# Patient Record
Sex: Male | Born: 1970 | Race: White | Hispanic: No | Marital: Single | Smoking: Former smoker
Health system: Southern US, Community
[De-identification: ages and names within clinical notes are randomized; demographics above are authoritative.]

## PROBLEM LIST (undated history)

## (undated) DIAGNOSIS — S069XAA Unspecified intracranial injury with loss of consciousness status unknown, initial encounter: Secondary | ICD-10-CM

## (undated) DIAGNOSIS — Z87442 Personal history of urinary calculi: Secondary | ICD-10-CM

## (undated) DIAGNOSIS — F1011 Alcohol abuse, in remission: Secondary | ICD-10-CM

## (undated) DIAGNOSIS — K219 Gastro-esophageal reflux disease without esophagitis: Secondary | ICD-10-CM

## (undated) DIAGNOSIS — R251 Tremor, unspecified: Secondary | ICD-10-CM

## (undated) DIAGNOSIS — R569 Unspecified convulsions: Secondary | ICD-10-CM

## (undated) DIAGNOSIS — K7460 Unspecified cirrhosis of liver: Secondary | ICD-10-CM

## (undated) DIAGNOSIS — F32A Depression, unspecified: Secondary | ICD-10-CM

## (undated) DIAGNOSIS — F329 Major depressive disorder, single episode, unspecified: Secondary | ICD-10-CM

## (undated) DIAGNOSIS — S069X9A Unspecified intracranial injury with loss of consciousness of unspecified duration, initial encounter: Secondary | ICD-10-CM

## (undated) DIAGNOSIS — I82409 Acute embolism and thrombosis of unspecified deep veins of unspecified lower extremity: Secondary | ICD-10-CM

## (undated) HISTORY — DX: Gastro-esophageal reflux disease without esophagitis: K21.9

## (undated) HISTORY — PX: GASTROSTOMY TUBE PLACEMENT: SHX655

## (undated) HISTORY — PX: TRACHEOSTOMY: SUR1362

## (undated) HISTORY — PX: BRAIN SURGERY: SHX531

## (undated) HISTORY — DX: Unspecified cirrhosis of liver: K74.60

## (undated) HISTORY — DX: Depression, unspecified: F32.A

## (undated) HISTORY — PX: REMOVAL OF GASTROSTOMY TUBE: SHX6058

## (undated) HISTORY — DX: Major depressive disorder, single episode, unspecified: F32.9

---

## 1999-03-12 ENCOUNTER — Ambulatory Visit (HOSPITAL_BASED_OUTPATIENT_CLINIC_OR_DEPARTMENT_OTHER): Admission: RE | Admit: 1999-03-12 | Discharge: 1999-03-12 | Payer: Self-pay | Admitting: General Surgery

## 2013-10-16 ENCOUNTER — Ambulatory Visit: Payer: Medicaid Other | Attending: Physical Therapy | Admitting: Physical Therapy

## 2013-10-16 DIAGNOSIS — R279 Unspecified lack of coordination: Secondary | ICD-10-CM | POA: Insufficient documentation

## 2013-10-16 DIAGNOSIS — Z5189 Encounter for other specified aftercare: Secondary | ICD-10-CM | POA: Insufficient documentation

## 2013-10-16 DIAGNOSIS — R569 Unspecified convulsions: Secondary | ICD-10-CM | POA: Insufficient documentation

## 2013-10-16 DIAGNOSIS — M6281 Muscle weakness (generalized): Secondary | ICD-10-CM | POA: Insufficient documentation

## 2013-10-16 DIAGNOSIS — Z87820 Personal history of traumatic brain injury: Secondary | ICD-10-CM | POA: Insufficient documentation

## 2013-10-22 ENCOUNTER — Ambulatory Visit: Payer: Medicaid Other | Admitting: Occupational Therapy

## 2013-10-22 ENCOUNTER — Ambulatory Visit: Payer: Medicaid Other

## 2013-10-22 DIAGNOSIS — M6281 Muscle weakness (generalized): Secondary | ICD-10-CM | POA: Diagnosis not present

## 2013-10-22 DIAGNOSIS — R279 Unspecified lack of coordination: Secondary | ICD-10-CM | POA: Diagnosis not present

## 2013-10-22 DIAGNOSIS — Z87820 Personal history of traumatic brain injury: Secondary | ICD-10-CM | POA: Diagnosis not present

## 2013-10-22 DIAGNOSIS — Z5189 Encounter for other specified aftercare: Secondary | ICD-10-CM | POA: Diagnosis present

## 2013-10-22 DIAGNOSIS — R569 Unspecified convulsions: Secondary | ICD-10-CM | POA: Diagnosis not present

## 2013-10-25 ENCOUNTER — Ambulatory Visit: Payer: Medicaid Other | Attending: Internal Medicine | Admitting: Speech Pathology

## 2013-10-25 DIAGNOSIS — R4701 Aphasia: Secondary | ICD-10-CM | POA: Diagnosis not present

## 2013-10-25 DIAGNOSIS — R279 Unspecified lack of coordination: Secondary | ICD-10-CM | POA: Diagnosis not present

## 2013-10-25 DIAGNOSIS — M6281 Muscle weakness (generalized): Secondary | ICD-10-CM | POA: Diagnosis not present

## 2013-10-25 DIAGNOSIS — Z87820 Personal history of traumatic brain injury: Secondary | ICD-10-CM | POA: Diagnosis not present

## 2013-10-25 DIAGNOSIS — Z5189 Encounter for other specified aftercare: Secondary | ICD-10-CM | POA: Insufficient documentation

## 2013-10-25 DIAGNOSIS — R1313 Dysphagia, pharyngeal phase: Secondary | ICD-10-CM | POA: Diagnosis not present

## 2013-10-25 DIAGNOSIS — R262 Difficulty in walking, not elsewhere classified: Secondary | ICD-10-CM | POA: Insufficient documentation

## 2013-11-15 ENCOUNTER — Ambulatory Visit: Payer: Medicaid Other | Admitting: Occupational Therapy

## 2013-11-15 ENCOUNTER — Ambulatory Visit: Payer: Medicaid Other | Admitting: Speech Pathology

## 2013-11-15 DIAGNOSIS — Z5189 Encounter for other specified aftercare: Secondary | ICD-10-CM | POA: Diagnosis not present

## 2013-11-21 ENCOUNTER — Ambulatory Visit: Payer: Medicaid Other

## 2013-11-21 ENCOUNTER — Ambulatory Visit: Payer: Medicaid Other | Admitting: Occupational Therapy

## 2013-11-21 DIAGNOSIS — Z5189 Encounter for other specified aftercare: Secondary | ICD-10-CM | POA: Diagnosis not present

## 2013-11-23 ENCOUNTER — Encounter: Payer: Medicaid Other | Admitting: Occupational Therapy

## 2013-11-23 ENCOUNTER — Ambulatory Visit: Payer: Medicaid Other

## 2013-11-27 ENCOUNTER — Ambulatory Visit: Payer: Medicaid Other | Attending: Internal Medicine | Admitting: Physical Therapy

## 2013-11-27 ENCOUNTER — Ambulatory Visit: Payer: Medicaid Other | Admitting: Speech Pathology

## 2013-11-27 ENCOUNTER — Ambulatory Visit: Payer: Medicaid Other | Admitting: Occupational Therapy

## 2013-11-27 DIAGNOSIS — M6281 Muscle weakness (generalized): Secondary | ICD-10-CM | POA: Diagnosis not present

## 2013-11-27 DIAGNOSIS — Z5189 Encounter for other specified aftercare: Secondary | ICD-10-CM | POA: Insufficient documentation

## 2013-11-27 DIAGNOSIS — R1313 Dysphagia, pharyngeal phase: Secondary | ICD-10-CM | POA: Diagnosis not present

## 2013-11-27 DIAGNOSIS — R4701 Aphasia: Secondary | ICD-10-CM | POA: Insufficient documentation

## 2013-11-27 DIAGNOSIS — Z87820 Personal history of traumatic brain injury: Secondary | ICD-10-CM | POA: Diagnosis not present

## 2013-11-27 DIAGNOSIS — R262 Difficulty in walking, not elsewhere classified: Secondary | ICD-10-CM | POA: Insufficient documentation

## 2013-11-27 DIAGNOSIS — R279 Unspecified lack of coordination: Secondary | ICD-10-CM | POA: Insufficient documentation

## 2013-11-29 ENCOUNTER — Ambulatory Visit: Payer: Medicaid Other

## 2013-11-29 ENCOUNTER — Encounter: Payer: Medicaid Other | Admitting: Occupational Therapy

## 2013-11-29 ENCOUNTER — Ambulatory Visit: Payer: Medicaid Other | Admitting: Speech Pathology

## 2013-11-29 ENCOUNTER — Encounter: Payer: Medicaid Other | Admitting: Speech Pathology

## 2013-11-29 DIAGNOSIS — Z5189 Encounter for other specified aftercare: Secondary | ICD-10-CM | POA: Diagnosis not present

## 2013-12-04 ENCOUNTER — Ambulatory Visit: Payer: Medicaid Other | Admitting: Occupational Therapy

## 2013-12-04 ENCOUNTER — Ambulatory Visit: Payer: Medicaid Other | Admitting: Physical Therapy

## 2013-12-04 ENCOUNTER — Ambulatory Visit: Payer: Medicaid Other | Admitting: Speech Pathology

## 2013-12-04 DIAGNOSIS — Z5189 Encounter for other specified aftercare: Secondary | ICD-10-CM | POA: Diagnosis not present

## 2013-12-06 ENCOUNTER — Ambulatory Visit: Payer: Medicaid Other | Admitting: Speech Pathology

## 2013-12-06 ENCOUNTER — Ambulatory Visit: Payer: Medicaid Other

## 2013-12-06 DIAGNOSIS — Z5189 Encounter for other specified aftercare: Secondary | ICD-10-CM | POA: Diagnosis not present

## 2013-12-10 ENCOUNTER — Emergency Department (HOSPITAL_COMMUNITY): Payer: Medicaid Other

## 2013-12-10 ENCOUNTER — Emergency Department (HOSPITAL_COMMUNITY)
Admission: EM | Admit: 2013-12-10 | Discharge: 2013-12-10 | Disposition: A | Discharge status: Home / Self Care | Payer: Medicaid Other | Attending: Emergency Medicine | Admitting: Emergency Medicine

## 2013-12-10 ENCOUNTER — Encounter (HOSPITAL_COMMUNITY): Payer: Self-pay | Admitting: Emergency Medicine

## 2013-12-10 DIAGNOSIS — Z86718 Personal history of other venous thrombosis and embolism: Secondary | ICD-10-CM | POA: Insufficient documentation

## 2013-12-10 DIAGNOSIS — R1084 Generalized abdominal pain: Secondary | ICD-10-CM | POA: Insufficient documentation

## 2013-12-10 DIAGNOSIS — R0602 Shortness of breath: Secondary | ICD-10-CM | POA: Insufficient documentation

## 2013-12-10 DIAGNOSIS — Z7901 Long term (current) use of anticoagulants: Secondary | ICD-10-CM | POA: Insufficient documentation

## 2013-12-10 DIAGNOSIS — Z8672 Personal history of thrombophlebitis: Secondary | ICD-10-CM | POA: Insufficient documentation

## 2013-12-10 DIAGNOSIS — G40909 Epilepsy, unspecified, not intractable, without status epilepticus: Secondary | ICD-10-CM | POA: Insufficient documentation

## 2013-12-10 DIAGNOSIS — Z79899 Other long term (current) drug therapy: Secondary | ICD-10-CM | POA: Insufficient documentation

## 2013-12-10 DIAGNOSIS — R109 Unspecified abdominal pain: Secondary | ICD-10-CM | POA: Insufficient documentation

## 2013-12-10 DIAGNOSIS — Z87442 Personal history of urinary calculi: Secondary | ICD-10-CM | POA: Insufficient documentation

## 2013-12-10 LAB — CBC WITH DIFFERENTIAL/PLATELET
BASOS ABS: 0 10*3/uL (ref 0.0–0.1)
Basophils Relative: 1 % (ref 0–1)
Eosinophils Absolute: 0.3 10*3/uL (ref 0.0–0.7)
Eosinophils Relative: 3 % (ref 0–5)
HCT: 34.2 % — ABNORMAL LOW (ref 39.0–52.0)
HEMOGLOBIN: 11.2 g/dL — AB (ref 13.0–17.0)
Lymphocytes Relative: 20 % (ref 12–46)
Lymphs Abs: 1.8 10*3/uL (ref 0.7–4.0)
MCH: 27.9 pg (ref 26.0–34.0)
MCHC: 32.7 g/dL (ref 30.0–36.0)
MCV: 85.1 fL (ref 78.0–100.0)
Monocytes Absolute: 1 10*3/uL (ref 0.1–1.0)
Monocytes Relative: 12 % (ref 3–12)
NEUTROS ABS: 5.8 10*3/uL (ref 1.7–7.7)
NEUTROS PCT: 64 % (ref 43–77)
Platelets: 152 10*3/uL (ref 150–400)
RBC: 4.02 MIL/uL — ABNORMAL LOW (ref 4.22–5.81)
RDW: 15.5 % (ref 11.5–15.5)
WBC: 8.9 10*3/uL (ref 4.0–10.5)

## 2013-12-10 LAB — URINALYSIS, ROUTINE W REFLEX MICROSCOPIC
Bilirubin Urine: NEGATIVE
GLUCOSE, UA: NEGATIVE mg/dL
KETONES UR: NEGATIVE mg/dL
NITRITE: NEGATIVE
PROTEIN: NEGATIVE mg/dL
Specific Gravity, Urine: 1.019 (ref 1.005–1.030)
Urobilinogen, UA: 1 mg/dL (ref 0.0–1.0)
pH: 7 (ref 5.0–8.0)

## 2013-12-10 LAB — COMPREHENSIVE METABOLIC PANEL
ALBUMIN: 3.8 g/dL (ref 3.5–5.2)
ALK PHOS: 222 U/L — AB (ref 39–117)
ALT: 33 U/L (ref 0–53)
AST: 40 U/L — AB (ref 0–37)
Anion gap: 14 (ref 5–15)
BILIRUBIN TOTAL: 0.5 mg/dL (ref 0.3–1.2)
BUN: 19 mg/dL (ref 6–23)
CHLORIDE: 102 meq/L (ref 96–112)
CO2: 23 mEq/L (ref 19–32)
Calcium: 10.2 mg/dL (ref 8.4–10.5)
Creatinine, Ser: 0.97 mg/dL (ref 0.50–1.35)
GFR calc Af Amer: >90 mL/min (ref >90)
GFR calc non Af Amer: >90 mL/min (ref >90)
Glucose, Bld: 98 mg/dL (ref 70–99)
POTASSIUM: 4.3 meq/L (ref 3.7–5.3)
Sodium: 139 mEq/L (ref 137–147)
Total Protein: 8.1 g/dL (ref 6.0–8.3)

## 2013-12-10 LAB — URINE MICROSCOPIC-ADD ON

## 2013-12-10 LAB — LIPASE, BLOOD: Lipase: 13 U/L (ref 11–59)

## 2013-12-10 MED ORDER — ONDANSETRON 8 MG PO TBDP
8.0000 mg | ORAL_TABLET | Freq: Once | ORAL | Status: AC
  Administered 2013-12-10: 8 mg via ORAL
  Filled 2013-12-10: qty 1

## 2013-12-10 MED ORDER — AMANTADINE HCL 50 MG/5ML PO SYRP
100.0000 mg | ORAL_SOLUTION | Freq: Two times a day (BID) | ORAL | Status: DC
  Administered 2013-12-10: 100 mg via ORAL
  Filled 2013-12-10: qty 10

## 2013-12-10 MED ORDER — IOHEXOL 350 MG/ML SOLN
100.0000 mL | Freq: Once | INTRAVENOUS | Status: AC | PRN
  Administered 2013-12-10: 100 mL via INTRAVENOUS

## 2013-12-10 MED ORDER — ONDANSETRON HCL 4 MG PO TABS
4.0000 mg | ORAL_TABLET | Freq: Four times a day (QID) | ORAL | Status: DC
Start: 2013-12-10 — End: 2017-02-28

## 2013-12-10 MED ORDER — ONDANSETRON HCL 4 MG/2ML IJ SOLN
4.0000 mg | Freq: Once | INTRAMUSCULAR | Status: AC
  Administered 2013-12-10: 4 mg via INTRAVENOUS
  Filled 2013-12-10: qty 2

## 2013-12-10 MED ORDER — HYDROMORPHONE HCL PF 1 MG/ML IJ SOLN
1.0000 mg | Freq: Once | INTRAMUSCULAR | Status: AC
  Administered 2013-12-10: 1 mg via INTRAVENOUS
  Filled 2013-12-10: qty 1

## 2013-12-10 MED ORDER — RIVAROXABAN 20 MG PO TABS: 20.0000 mg | ORAL_TABLET | Freq: Every day | ORAL | Status: DC

## 2013-12-10 MED ORDER — LEVETIRACETAM 100 MG/ML PO SOLN
1000.0000 mg | Freq: Two times a day (BID) | ORAL | Status: DC
  Administered 2013-12-10: 1000 mg via ORAL
  Filled 2013-12-10: qty 10

## 2013-12-10 MED ORDER — BROMOCRIPTINE MESYLATE 2.5 MG PO TABS
5.0000 mg | ORAL_TABLET | Freq: Two times a day (BID) | ORAL | Status: DC
  Administered 2013-12-10: 5 mg via ORAL
  Filled 2013-12-10: qty 2

## 2013-12-10 MED ORDER — OXYCODONE-ACETAMINOPHEN 5-325 MG PO TABS: 1.0000 | ORAL_TABLET | Freq: Four times a day (QID) | ORAL | Status: DC | PRN

## 2013-12-10 MED ORDER — HYDROCODONE-ACETAMINOPHEN 5-325 MG PO TABS
2.0000 | ORAL_TABLET | Freq: Once | ORAL | Status: AC
  Administered 2013-12-10: 2 via ORAL
  Filled 2013-12-10: qty 2

## 2013-12-10 MED ORDER — MIRTAZAPINE 30 MG PO TABS
15.0000 mg | ORAL_TABLET | Freq: Every day | ORAL | Status: DC
  Administered 2013-12-10: 15 mg via ORAL
  Filled 2013-12-10: qty 1

## 2013-12-10 NOTE — ED Provider Notes (Signed)
CSN: [id number]     Arrival date & time 12/10/13  1845 History   First MD Initiated Contact with Patient 12/10/13 1857     Chief Complaint  Patient presents with  . Flank Pain     (Consider location/radiation/quality/duration/timing/severity/associated sxs/prior Treatment) HPI Comments: Patient is a 43 year old male with history of traumatic brain injury who presents the emergency department today with sudden onset right flank pain. He was at his primary care office earlier today being evaluated for cough and worsening shortness of breath. At that time basic labs are drawn as well as a d-dimer. His d-dimer is elevated at 2. The patient is unable to describe this pain for me. He has history of prior pulmonary embolism. He is anticoagulated on Xarelto. He has history of prior kidney stones. His father gives most of the history as patient is unable to coherently speak to me. The father feels as though this is a kidney stone. No fevers, diarrhea, vomiting.   Patient is a 43 y.o. male presenting with flank pain. The history is provided by the patient. No language interpreter was used.  Flank Pain    History reviewed. No pertinent past medical history. History reviewed. No pertinent past surgical history. No family history on file. History  Substance Use Topics  . Smoking status: Not on file  . Smokeless tobacco: Not on file  . Alcohol Use: Not on file    Review of Systems  Unable to perform ROS: Patient nonverbal      Allergies  Review of patient's allergies indicates no known allergies.  Home Medications   Prior to Admission medications   Medication Sig Start Date End Date Taking? Authorizing Provider  amantadine (SYMMETREL) 50 MG/5ML solution Take 100 mg by mouth 2 (two) times daily.   Yes Historical Provider, MD  bromocriptine (PARLODEL) 2.5 MG tablet Take 5 mg by mouth 2 (two) times daily.   Yes Historical Provider, MD  levETIRAcetam (KEPPRA) 100 MG/ML solution Take 1,000 mg  by mouth 2 (two) times daily.   Yes Historical Provider, MD  mirtazapine (REMERON) 15 MG tablet Take 15 mg by mouth at bedtime.  12/07/13 01/06/14 Yes Historical Provider, MD  polyethylene glycol (MIRALAX / GLYCOLAX) packet Take 17 g by mouth daily as needed for mild constipation.   Yes Historical Provider, MD  rivaroxaban (XARELTO) 20 MG TABS tablet Take 20 mg by mouth daily.   Yes Historical Provider, MD  risperiDONE (RISPERDAL) 1 MG/ML oral solution Take 0.5 mg by mouth at bedtime.  12/10/13 01/09/14  Historical Provider, MD   BP 110/66  Pulse 88  Temp(Src) 97.7 F (36.5 C) (Oral)  Resp 32  SpO2 96% Physical Exam  Nursing note and vitals reviewed. Constitutional: He appears well-developed and well-nourished. He appears distressed.  HENT:  Head: Normocephalic and atraumatic.  Right Ear: External ear normal.  Left Ear: External ear normal.  Nose: Nose normal.  Eyes: Conjunctivae are normal.  Neck: Normal range of motion. No tracheal deviation present.  Cardiovascular: Normal rate, regular rhythm, normal heart sounds, intact distal pulses and normal pulses.   Pulses:      Radial pulses are 2+ on the right side, and 2+ on the left side.       Posterior tibial pulses are 2+ on the right side, and 2+ on the left side.  Pulmonary/Chest: Effort normal and breath sounds normal. No stridor. He has no wheezes. He has no rales.    Abdominal: Soft. He exhibits no distension. There is generalized  tenderness.  Generalized abdominal tenderness with maximal tenderness over right flank  Musculoskeletal: Normal range of motion.  Neurological: He is alert.  Skin: Skin is warm and dry. He is not diaphoretic.  Psychiatric: He has a normal mood and affect. His behavior is normal.    ED Course  Procedures (including critical care time) Labs Review Labs Reviewed  CBC WITH DIFFERENTIAL - Abnormal; Notable for the following:    RBC 4.02 (*)    Hemoglobin 11.2 (*)    HCT 34.2 (*)    All other  components within normal limits  COMPREHENSIVE METABOLIC PANEL - Abnormal; Notable for the following:    AST 40 (*)    Alkaline Phosphatase 222 (*)    All other components within normal limits  URINALYSIS, ROUTINE W REFLEX MICROSCOPIC - Abnormal; Notable for the following:    Color, Urine AMBER (*)    APPearance CLOUDY (*)    Hgb urine dipstick LARGE (*)    Leukocytes, UA TRACE (*)    All other components within normal limits  URINE CULTURE  LIPASE, BLOOD  URINE MICROSCOPIC-ADD ON    Imaging Review Ct Angio Chest Pe W/cm &/or Wo Cm  12/10/2013   CLINICAL DATA:  History of traumatic brain injury with recent agitation. History of blood clots in the long, now with elevated D-dimer. Evaluate for pulmonary embolism.  EXAM: CT ANGIOGRAPHY CHEST WITH CONTRAST  TECHNIQUE: Multidetector CT imaging of the chest was performed using the standard protocol during bolus administration of intravenous contrast. Multiplanar CT image reconstructions and MIPs were obtained to evaluate the vascular anatomy.  CONTRAST:  OMNIPAQUE IOHEXOL 350 MG/ML SOLN  COMPARISON:  None.  FINDINGS: Vascular Findings:  There is adequate opacification of the pulmonary arterial system with the main pulmonary artery measuring 350 Hounsfield units. There are no discrete filling defects within the pulmonary arterial tree to suggest pulmonary embolism. Normal caliber the main pulmonary artery.  Normal heart size. No pericardial effusion. Normal caliber of the thoracic aorta. Conventional configuration of the aortic arch. The branch vessels of the aortic arch widely patent throughout their imaged course. No definite thoracic aortic dissection or periaortic stranding.  Review of the MIP images confirms the above findings.   ----------------------------------------------------------------------------------  Nonvascular Findings:  Mild centrilobular emphysematous change. There is minimal subpleural reticulation within the peripheral aspect  of the right lung apex (image 19, series 4). There is minimal subpleural atelectasis about the bilateral major fissures, right greater than left. There is dependent subpleural atelectasis within the bilateral lower lobes. No discrete focal airspace opacities. No pleural effusion or pneumothorax. The central pulmonary airways are widely patent.  No new discrete pulmonary nodules. No mediastinal, hilar axillary lymphadenopathy.  Limited early arterial phase evaluation of the upper abdomen is normal.  There is an age-indeterminate moderate (approximately 50%) compression deformity of the T8 vertebral body without associated displaced fracture line or paraspinal hematoma. Regional soft tissues appear normal. Normal appearance of the thyroid gland.  IMPRESSION: 1. No acute cardiopulmonary disease. Specifically, no evidence of acute or chronic pulmonary embolism. 2. Mild centrilobular emphysematous change with scattered areas of architectural distortion and scar. No discrete focal airspace opacities to suggest pneumonia. 3. Age-indeterminate moderate (approximately 50%) compression deformity of the T8 vertebral body without associated displaced fracture line or paraspinal hematoma. Correlation for point tenderness at this location is recommended.   Electronically Signed   By: Simonne Come M.D.   On: 12/10/2013 21:49   Ct Abdomen Pelvis W Contrast  12/12/2013  CLINICAL DATA:  Mid abdominal pain.  Agitation.  Poor historian.  EXAM: CT ABDOMEN AND PELVIS WITH CONTRAST  TECHNIQUE: Multidetector CT imaging of the abdomen and pelvis was performed using the standard protocol following bolus administration of intravenous contrast.  CONTRAST:  50mL OMNIPAQUE IOHEXOL 300 MG/ML SOLN, 80mL OMNIPAQUE IOHEXOL 300 MG/ML SOLN  COMPARISON:  Retroperitoneal ultrasound December 10, 2013 trauma CT of the chest December 10, 2013  FINDINGS: LUNG BASES: Ground-glass opacities in the lingula, atelectasis in the lung bases. Visualized heart and  pericardium are unremarkable. Please see CT of chest from December 10, 2013  SOLID ORGANS: The liver, spleen, pancreas and adrenal glands are unremarkable. Nonvisualized gallbladder likely representing cholecystectomy.  GASTROINTESTINAL TRACT: Gastrostomy tube in place, intraluminal retaining fall. The stomach, small and large bowel are normal in course and caliber without inflammatory changes. Moderate amount of retained large bowel stool. The appendix is not discretely identified, however there are no inflammatory changes in the right lower quadrant.  KIDNEYS/ URINARY TRACT: Kidneys are orthotopic, demonstrating symmetric enhancement. Punctate right lower pole, 2 mm left interpolar, 3 mm left lower pole nephrolithiasis. No hydronephrosis or solid renal masses. The unopacified ureters are normal in course and caliber. Delayed imaging through the kidneys demonstrates symmetric prompt contrast excretion within the proximal urinary collecting system. Urinary bladder is well distended, dense with layering contrast from CT chest 1 day prior.  PERITONEUM/RETROPERITONEUM: No intraperitoneal free fluid nor free air. Aortoiliac vessels are normal in course and caliber, mild calcific atherosclerosis. No lymphadenopathy by CT size criteria. Internal reproductive organs are unremarkable.  SOFT TISSUE/OSSEOUS STRUCTURES: Nonsuspicious.  IMPRESSION: Moderate amount of retained large bowel stool. Gastrostomy tube in place. No acute intra-abdominal or pelvic process.  Bilateral nonobstructing nephrolithiasis measuring up to 3 mm.   Electronically Signed   By: Awilda Metro   On: 12/12/2013 00:24   US Renal  12/10/2013   CLINICAL DATA:  Right flank pain.  EXAM: RENAL/URINARY TRACT ULTRASOUND COMPLETE  COMPARISON:  None.  FINDINGS: Right Kidney:  Length: 11.1 cm. Normal renal cortical thickness and echogenicity without focal lesions or hydronephrosis.  Left Kidney:  Length: 10.9 cm. Normal renal cortical thickness and  echogenicity without focal lesions or hydronephrosis.  Bladder:  Normal.  Bilateral ureteral jets are documented.  IMPRESSION: Normal renal ultrasound examination.   Electronically Signed   By: Loralie Champagne M.D.   On: 12/10/2013 22:41      EKG Interpretation None      MDM   Final diagnoses:  Right flank pain    Patient presents to emergency department for evaluation of sudden onset right flank pain. He was at his primary care office earlier today and a d-dimer was drawn. D-dimer was elevated. CT angio chest was negative for pulmonary embolism and pneumonia. Renal ultrasound was negative for hydronephrosis. He does have a large amount of hemoglobin in the urine. It is possible that this is a small, nonobstructing kidney stone. Patient appears to be much more comfortable at this point. Workup is otherwise unremarkable. Patient will followup with primary care physician. Vital signs stable for discharge. Discussed reasons to return to emergency Department immediately. Dr. Gwendolyn Grant evaluated patient and agrees with plan. Patient / Family / Caregiver informed of clinical course, understand medical decision-making process, and agree with plan.     Mora Bellman, PA-C 12/14/13 [phone]

## 2013-12-10 NOTE — ED Notes (Addendum)
Per EMS, pt with hx of TBI began acting agitated 1 hour ago, clutching his right flank. Pt given fentanyl en route to hospital. Pt's father has a sheet of paper reporting the pt's d-dimer to be 234, states the pt has a hx of blood clots in his lung. Father also states he suspects the pt has a kidney stone.

## 2013-12-10 NOTE — ED Notes (Signed)
Pt attempted to give urine sample. Will try again later.

## 2013-12-10 NOTE — ED Notes (Signed)
[facility] Expected date:  Expected time:  Means of arrival:  Comments: EMS/flank pain

## 2013-12-10 NOTE — Discharge Instructions (Signed)
Flank Pain °Flank pain refers to pain that is located on the side of the body between the upper abdomen and the back. The pain may occur over a short period of time (acute) or may be long-term or reoccurring (chronic). It may be mild or severe. Flank pain can be caused by many things. °CAUSES  °Some of the more common causes of flank pain include: °· Muscle strains.   °· Muscle spasms.   °· A disease of your spine (vertebral disk disease).   °· A lung infection (pneumonia).   °· Fluid around your lungs (pulmonary edema).   °· A kidney infection.   °· Kidney stones.   °· A very painful skin rash caused by the chickenpox virus (shingles).   °· Gallbladder disease.   °HOME CARE INSTRUCTIONS  °Home care will depend on the cause of your pain. In general, °· Rest as directed by your caregiver. °· Drink enough fluids to keep your urine clear or pale yellow. °· Only take over-the-counter or prescription medicines as directed by your caregiver. Some medicines may help relieve the pain. °· Tell your caregiver about any changes in your pain. °· Follow up with your caregiver as directed. °SEEK IMMEDIATE MEDICAL CARE IF:  °· Your pain is not controlled with medicine.   °· You have new or worsening symptoms. °· Your pain increases.   °· You have abdominal pain.   °· You have shortness of breath.   °· You have persistent nausea or vomiting.   °· You have swelling in your abdomen.   °· You feel faint or pass out.   °· You have blood in your urine. °· You have a fever or persistent symptoms for more than 2-3 days. °· You have a fever and your symptoms suddenly get worse. °MAKE SURE YOU:  °· Understand these instructions. °· Will watch your condition. °· Will get help right away if you are not doing well or get worse. °Document Released: 06/03/2005 Document Revised: 01/05/2012 Document Reviewed: 11/25/2011 °[employer]® Patient Information ©2015 [employer]. This information is not intended to replace advice given to you by your  health care provider. Make sure you discuss any questions you have with your health care provider. ° °

## 2013-12-11 ENCOUNTER — Encounter (HOSPITAL_COMMUNITY): Payer: Self-pay | Admitting: Emergency Medicine

## 2013-12-11 ENCOUNTER — Emergency Department (HOSPITAL_COMMUNITY): Payer: Medicaid Other

## 2013-12-11 ENCOUNTER — Inpatient Hospital Stay (HOSPITAL_COMMUNITY)
Admission: EM | Admit: 2013-12-11 | Discharge: 2014-01-03 | DRG: 004 | Disposition: A | Discharge status: Skilled Nursing Facility | Payer: Medicaid Other | Attending: Internal Medicine | Admitting: Internal Medicine

## 2013-12-11 ENCOUNTER — Encounter: Payer: Medicaid Other | Admitting: Speech Pathology

## 2013-12-11 DIAGNOSIS — R000 Tachycardia, unspecified: Secondary | ICD-10-CM | POA: Diagnosis present

## 2013-12-11 DIAGNOSIS — E43 Unspecified severe protein-calorie malnutrition: Secondary | ICD-10-CM | POA: Diagnosis present

## 2013-12-11 DIAGNOSIS — Z046 Encounter for general psychiatric examination, requested by authority: Secondary | ICD-10-CM

## 2013-12-11 DIAGNOSIS — D638 Anemia in other chronic diseases classified elsewhere: Secondary | ICD-10-CM | POA: Diagnosis present

## 2013-12-11 DIAGNOSIS — J690 Pneumonitis due to inhalation of food and vomit: Secondary | ICD-10-CM

## 2013-12-11 DIAGNOSIS — R1310 Dysphagia, unspecified: Secondary | ICD-10-CM | POA: Diagnosis not present

## 2013-12-11 DIAGNOSIS — F17200 Nicotine dependence, unspecified, uncomplicated: Secondary | ICD-10-CM | POA: Diagnosis present

## 2013-12-11 DIAGNOSIS — Z681 Body mass index (BMI) 19 or less, adult: Secondary | ICD-10-CM

## 2013-12-11 DIAGNOSIS — R400 Somnolence: Secondary | ICD-10-CM

## 2013-12-11 DIAGNOSIS — E876 Hypokalemia: Secondary | ICD-10-CM | POA: Diagnosis not present

## 2013-12-11 DIAGNOSIS — Z79899 Other long term (current) drug therapy: Secondary | ICD-10-CM

## 2013-12-11 DIAGNOSIS — R0682 Tachypnea, not elsewhere classified: Secondary | ICD-10-CM | POA: Diagnosis not present

## 2013-12-11 DIAGNOSIS — E871 Hypo-osmolality and hyponatremia: Secondary | ICD-10-CM | POA: Diagnosis not present

## 2013-12-11 DIAGNOSIS — G9349 Other encephalopathy: Secondary | ICD-10-CM | POA: Diagnosis present

## 2013-12-11 DIAGNOSIS — Z72820 Sleep deprivation: Secondary | ICD-10-CM

## 2013-12-11 DIAGNOSIS — R29898 Other symptoms and signs involving the musculoskeletal system: Secondary | ICD-10-CM | POA: Diagnosis present

## 2013-12-11 DIAGNOSIS — N200 Calculus of kidney: Secondary | ICD-10-CM | POA: Diagnosis present

## 2013-12-11 DIAGNOSIS — R4701 Aphasia: Secondary | ICD-10-CM | POA: Diagnosis present

## 2013-12-11 DIAGNOSIS — Z8249 Family history of ischemic heart disease and other diseases of the circulatory system: Secondary | ICD-10-CM

## 2013-12-11 DIAGNOSIS — R451 Restlessness and agitation: Secondary | ICD-10-CM

## 2013-12-11 DIAGNOSIS — E870 Hyperosmolality and hypernatremia: Secondary | ICD-10-CM | POA: Diagnosis not present

## 2013-12-11 DIAGNOSIS — S069XAA Unspecified intracranial injury with loss of consciousness status unknown, initial encounter: Secondary | ICD-10-CM | POA: Diagnosis present

## 2013-12-11 DIAGNOSIS — R6510 Systemic inflammatory response syndrome (SIRS) of non-infectious origin without acute organ dysfunction: Secondary | ICD-10-CM | POA: Diagnosis present

## 2013-12-11 DIAGNOSIS — S069X9A Unspecified intracranial injury with loss of consciousness of unspecified duration, initial encounter: Secondary | ICD-10-CM | POA: Diagnosis present

## 2013-12-11 DIAGNOSIS — J189 Pneumonia, unspecified organism: Secondary | ICD-10-CM | POA: Diagnosis present

## 2013-12-11 DIAGNOSIS — F05 Delirium due to known physiological condition: Secondary | ICD-10-CM | POA: Diagnosis present

## 2013-12-11 DIAGNOSIS — Z87820 Personal history of traumatic brain injury: Secondary | ICD-10-CM

## 2013-12-11 DIAGNOSIS — S069X5S Unspecified intracranial injury with loss of consciousness greater than 24 hours with return to pre-existing conscious level, sequela: Secondary | ICD-10-CM

## 2013-12-11 DIAGNOSIS — G40909 Epilepsy, unspecified, not intractable, without status epilepticus: Secondary | ICD-10-CM | POA: Diagnosis present

## 2013-12-11 DIAGNOSIS — I82729 Chronic embolism and thrombosis of deep veins of unspecified upper extremity: Secondary | ICD-10-CM | POA: Diagnosis present

## 2013-12-11 DIAGNOSIS — Z87442 Personal history of urinary calculi: Secondary | ICD-10-CM

## 2013-12-11 DIAGNOSIS — J9819 Other pulmonary collapse: Secondary | ICD-10-CM | POA: Diagnosis present

## 2013-12-11 DIAGNOSIS — J438 Other emphysema: Secondary | ICD-10-CM | POA: Diagnosis present

## 2013-12-11 DIAGNOSIS — A419 Sepsis, unspecified organism: Secondary | ICD-10-CM

## 2013-12-11 DIAGNOSIS — R059 Cough, unspecified: Secondary | ICD-10-CM

## 2013-12-11 DIAGNOSIS — M6282 Rhabdomyolysis: Secondary | ICD-10-CM | POA: Diagnosis present

## 2013-12-11 DIAGNOSIS — G9340 Encephalopathy, unspecified: Secondary | ICD-10-CM

## 2013-12-11 DIAGNOSIS — Z7901 Long term (current) use of anticoagulants: Secondary | ICD-10-CM

## 2013-12-11 DIAGNOSIS — Z9181 History of falling: Secondary | ICD-10-CM

## 2013-12-11 DIAGNOSIS — J9600 Acute respiratory failure, unspecified whether with hypoxia or hypercapnia: Secondary | ICD-10-CM | POA: Diagnosis present

## 2013-12-11 DIAGNOSIS — F603 Borderline personality disorder: Secondary | ICD-10-CM | POA: Diagnosis present

## 2013-12-11 DIAGNOSIS — R05 Cough: Secondary | ICD-10-CM

## 2013-12-11 DIAGNOSIS — I82509 Chronic embolism and thrombosis of unspecified deep veins of unspecified lower extremity: Secondary | ICD-10-CM | POA: Diagnosis present

## 2013-12-11 DIAGNOSIS — Z993 Dependence on wheelchair: Secondary | ICD-10-CM

## 2013-12-11 DIAGNOSIS — D696 Thrombocytopenia, unspecified: Secondary | ICD-10-CM | POA: Diagnosis not present

## 2013-12-11 DIAGNOSIS — I959 Hypotension, unspecified: Secondary | ICD-10-CM | POA: Diagnosis not present

## 2013-12-11 DIAGNOSIS — J9601 Acute respiratory failure with hypoxia: Secondary | ICD-10-CM

## 2013-12-11 HISTORY — DX: Unspecified intracranial injury with loss of consciousness of unspecified duration, initial encounter: S06.9X9A

## 2013-12-11 HISTORY — DX: Unspecified convulsions: R56.9

## 2013-12-11 HISTORY — DX: Unspecified intracranial injury with loss of consciousness status unknown, initial encounter: S06.9XAA

## 2013-12-11 HISTORY — DX: Acute embolism and thrombosis of unspecified deep veins of unspecified lower extremity: I82.409

## 2013-12-11 HISTORY — DX: Personal history of urinary calculi: Z87.442

## 2013-12-11 LAB — CBC WITH DIFFERENTIAL/PLATELET
BASOS PCT: 1 % (ref 0–1)
Basophils Absolute: 0 10*3/uL (ref 0.0–0.1)
Eosinophils Absolute: 0.3 10*3/uL (ref 0.0–0.7)
Eosinophils Relative: 4 % (ref 0–5)
HCT: 33.5 % — ABNORMAL LOW (ref 39.0–52.0)
HEMOGLOBIN: 10.9 g/dL — AB (ref 13.0–17.0)
Lymphocytes Relative: 20 % (ref 12–46)
Lymphs Abs: 1.5 10*3/uL (ref 0.7–4.0)
MCH: 27.7 pg (ref 26.0–34.0)
MCHC: 32.5 g/dL (ref 30.0–36.0)
MCV: 85.2 fL (ref 78.0–100.0)
Monocytes Absolute: 0.6 10*3/uL (ref 0.1–1.0)
Monocytes Relative: 8 % (ref 3–12)
NEUTROS PCT: 67 % (ref 43–77)
Neutro Abs: 5.1 10*3/uL (ref 1.7–7.7)
Platelets: 144 10*3/uL — ABNORMAL LOW (ref 150–400)
RBC: 3.93 MIL/uL — ABNORMAL LOW (ref 4.22–5.81)
RDW: 15.4 % (ref 11.5–15.5)
WBC: 7.6 10*3/uL (ref 4.0–10.5)

## 2013-12-11 LAB — COMPREHENSIVE METABOLIC PANEL
ALBUMIN: 3.7 g/dL (ref 3.5–5.2)
ALK PHOS: 236 U/L — AB (ref 39–117)
ALT: 35 U/L (ref 0–53)
ANION GAP: 14 (ref 5–15)
AST: 41 U/L — ABNORMAL HIGH (ref 0–37)
BUN: 21 mg/dL (ref 6–23)
CO2: 26 mEq/L (ref 19–32)
CREATININE: 1.11 mg/dL (ref 0.50–1.35)
Calcium: 10.4 mg/dL (ref 8.4–10.5)
Chloride: 100 mEq/L (ref 96–112)
GFR calc Af Amer: >90 mL/min (ref >90)
GFR calc non Af Amer: 79 mL/min — ABNORMAL LOW (ref >90)
Glucose, Bld: 127 mg/dL — ABNORMAL HIGH (ref 70–99)
POTASSIUM: 4.6 meq/L (ref 3.7–5.3)
Sodium: 140 mEq/L (ref 137–147)
Total Bilirubin: 0.4 mg/dL (ref 0.3–1.2)
Total Protein: 8.1 g/dL (ref 6.0–8.3)

## 2013-12-11 LAB — LIPASE, BLOOD: LIPASE: 16 U/L (ref 11–59)

## 2013-12-11 MED ORDER — IOHEXOL 300 MG/ML  SOLN
100.0000 mL | Freq: Once | INTRAMUSCULAR | Status: AC | PRN
  Administered 2013-12-11: 80 mL via INTRAVENOUS

## 2013-12-11 MED ORDER — IOHEXOL 300 MG/ML  SOLN
50.0000 mL | Freq: Once | INTRAMUSCULAR | Status: AC | PRN
  Administered 2013-12-11: 50 mL via ORAL

## 2013-12-11 NOTE — ED Notes (Signed)
Per EMS-parents are unable to care for him at home and states they are in the process of IVC'ing patient
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
## 2013-12-11 NOTE — ED Notes (Signed)
Patient arrives by EMS with complaints of increased agitation-patient has TBI-was just seen yesterday for same complaint-patient had urinary retention yesterday and had to be cathed/patient indicating to EMS-pain in groin area

## 2013-12-11 NOTE — ED Notes (Signed)
Bed: RESA Expected date:  Expected time:  Means of arrival:  Comments: EMS 43 yo male with combative behavior-hx TBI-fifficulty urinating

## 2013-12-12 ENCOUNTER — Encounter (HOSPITAL_COMMUNITY): Payer: Self-pay | Admitting: Registered Nurse

## 2013-12-12 DIAGNOSIS — S069XAA Unspecified intracranial injury with loss of consciousness status unknown, initial encounter: Secondary | ICD-10-CM

## 2013-12-12 DIAGNOSIS — S069X9A Unspecified intracranial injury with loss of consciousness of unspecified duration, initial encounter: Secondary | ICD-10-CM

## 2013-12-12 LAB — URINE CULTURE
CULTURE: NO GROWTH
Colony Count: NO GROWTH

## 2013-12-12 MED ORDER — OXYCODONE-ACETAMINOPHEN 5-325 MG PO TABS: 1.0000 | ORAL_TABLET | Freq: Four times a day (QID) | ORAL | Status: DC | PRN

## 2013-12-12 MED ORDER — LORAZEPAM 2 MG/ML IJ SOLN
1.0000 mg | Freq: Once | INTRAMUSCULAR | Status: AC
  Administered 2013-12-12: 1 mg via INTRAMUSCULAR
  Filled 2013-12-12: qty 1

## 2013-12-12 MED ORDER — RISPERIDONE 1 MG/ML PO SOLN
0.5000 mg | Freq: Every day | ORAL | Status: DC
  Administered 2013-12-12: 0.5 mg via ORAL
  Filled 2013-12-12 (×2): qty 0.5

## 2013-12-12 MED ORDER — MIRTAZAPINE 15 MG PO TABS
15.0000 mg | ORAL_TABLET | Freq: Every day | ORAL | Status: DC
  Administered 2013-12-13: 15 mg via ORAL
  Filled 2013-12-12: qty 1

## 2013-12-12 MED ORDER — BROMOCRIPTINE MESYLATE 2.5 MG PO TABS
5.0000 mg | ORAL_TABLET | Freq: Two times a day (BID) | ORAL | Status: DC
  Administered 2013-12-13: 5 mg via ORAL
  Filled 2013-12-12 (×3): qty 2

## 2013-12-12 MED ORDER — RIVAROXABAN 20 MG PO TABS
20.0000 mg | ORAL_TABLET | Freq: Every day | ORAL | Status: DC
  Administered 2013-12-13: 20 mg via ORAL
  Filled 2013-12-12 (×3): qty 1

## 2013-12-12 MED ORDER — LORAZEPAM 2 MG/ML IJ SOLN
2.0000 mg | Freq: Once | INTRAMUSCULAR | Status: AC
  Administered 2013-12-12: 2 mg via INTRAMUSCULAR
  Filled 2013-12-12: qty 1

## 2013-12-12 MED ORDER — STERILE WATER FOR INJECTION IJ SOLN
INTRAMUSCULAR | Status: AC
  Administered 2013-12-12: 15:00:00
  Filled 2013-12-12: qty 10

## 2013-12-12 MED ORDER — ZIPRASIDONE MESYLATE 20 MG IM SOLR
INTRAMUSCULAR | Status: AC
  Filled 2013-12-12: qty 20

## 2013-12-12 MED ORDER — ZIPRASIDONE MESYLATE 20 MG IM SOLR
20.0000 mg | Freq: Once | INTRAMUSCULAR | Status: AC
  Administered 2013-12-12: 20 mg via INTRAMUSCULAR
  Filled 2013-12-12: qty 20

## 2013-12-12 MED ORDER — STERILE WATER FOR INJECTION IJ SOLN
INTRAMUSCULAR | Status: AC
  Filled 2013-12-12: qty 10

## 2013-12-12 MED ORDER — AMANTADINE HCL 50 MG/5ML PO SYRP
100.0000 mg | ORAL_SOLUTION | Freq: Two times a day (BID) | ORAL | Status: DC
  Administered 2013-12-12: 100 mg via ORAL
  Filled 2013-12-12 (×3): qty 10

## 2013-12-12 MED ORDER — LORAZEPAM 1 MG PO TABS
1.0000 mg | ORAL_TABLET | Freq: Three times a day (TID) | ORAL | Status: DC | PRN
  Administered 2013-12-12: 1 mg via ORAL
  Filled 2013-12-12: qty 1

## 2013-12-12 MED ORDER — LORAZEPAM 1 MG PO TABS
1.0000 mg | ORAL_TABLET | Freq: Once | ORAL | Status: AC
  Administered 2013-12-12: 1 mg via ORAL
  Filled 2013-12-12: qty 1

## 2013-12-12 NOTE — ED Notes (Signed)
Pt  Was given PO fluids and apple sauce to eat

## 2013-12-12 NOTE — Consult Note (Signed)
[facility] Face-to-Face Psychiatry Consult   Reason for Consult:  Aggressive behavior Referring Physician:  EDP  Hector [name] is an 43 y.o. male. Total Time spent with patient: 30 minutes  Assessment: AXIS I:  Tramatic Brain Injury AXIS II:  Deferred AXIS III:   Past Medical History  Diagnosis Date  . TBI (traumatic brain injury)   . Seizure    AXIS IV:  other psychosocial or environmental problems AXIS V:  Unable to determine patient level related to patient being non-communitive and unsure if patient is able to understand  Plan:  Clear psychiatrically  Subjective:   Hector [name] is a 43 y.o. male patient.  HPI:  Patient brought to [facility] related to increased agitation and aggression at home.  Patient is unable to communicate verbally.  When asked questions patient would only grunt.  Interviewers are uncertain if patient understands questions asked.  Interpretation is that patient is not processing all of the information correctly.  Also some of patient behavior my be related to not being able to express himself when he is trying to do something and is prevented because others who can not understand his actions.  It is recommended that patient follow up with a neurologist once patient is medically cleared in the emergency room.  Also refer patient to social work and care management related to getting assistance with placement  If needed by patients parents HPI Elements:   Location:  TBI. Quality:  aggressive. Severity:  agitation. Timing:  76month.  Past Psychiatric History: Past Medical History  Diagnosis Date  . TBI (traumatic brain injury)   . Seizure     has no tobacco, alcohol, and drug history on file. No family history on file.         Allergies:  No Known Allergies  ACT Assessment Complete:  Yes:    Educational Status    Risk to Self:    Risk to Others:    Abuse:    Prior Inpatient Therapy:    Prior Outpatient Therapy:    Additional Information:     No  family history on file. Review of Systems  Unable to perform ROS: medical condition     Objective: Blood pressure 112/68, pulse 104, temperature 97.9 F (36.6 C), temperature source Oral, resp. rate 16, SpO2 98.00%.There is no height or weight on file to calculate BMI. Results for orders placed during the hospital encounter of 12/11/13 (from the past 72 hour(s))  CBC WITH DIFFERENTIAL     Status: Abnormal   Collection Time    12/11/13 10:17 PM      Result Value Ref Range   WBC 7.6  4.0 - 10.5 K/uL   RBC 3.93 (*) 4.22 - 5.81 MIL/uL   Hemoglobin 10.9 (*) 13.0 - 17.0 g/dL   HCT 33.5 (*) 39.0 - 52.0 %   MCV 85.2  78.0 - 100.0 fL   MCH 27.7  26.0 - 34.0 pg   MCHC 32.5  30.0 - 36.0 g/dL   RDW 15.4  11.5 - 15.5 %   Platelets 144 (*) 150 - 400 K/uL   Neutrophils Relative % 67  43 - 77 %   Neutro Abs 5.1  1.7 - 7.7 K/uL   Lymphocytes Relative 20  12 - 46 %   Lymphs Abs 1.5  0.7 - 4.0 K/uL   Monocytes Relative 8  3 - 12 %   Monocytes Absolute 0.6  0.1 - 1.0 K/uL   Eosinophils Relative 4  0 -  5 %   Eosinophils Absolute 0.3  0.0 - 0.7 K/uL   Basophils Relative 1  0 - 1 %   Basophils Absolute 0.0  0.0 - 0.1 K/uL  COMPREHENSIVE METABOLIC PANEL     Status: Abnormal   Collection Time    12/11/13 10:17 PM      Result Value Ref Range   Sodium 140  137 - 147 mEq/L   Potassium 4.6  3.7 - 5.3 mEq/L   Chloride 100  96 - 112 mEq/L   CO2 26  19 - 32 mEq/L   Glucose, Bld 127 (*) 70 - 99 mg/dL   BUN 21  6 - 23 mg/dL   Creatinine, Ser 1.11  0.50 - 1.35 mg/dL   Calcium 10.4  8.4 - 10.5 mg/dL   Total Protein 8.1  6.0 - 8.3 g/dL   Albumin 3.7  3.5 - 5.2 g/dL   AST 41 (*) 0 - 37 U/L   ALT 35  0 - 53 U/L   Alkaline Phosphatase 236 (*) 39 - 117 U/L   Total Bilirubin 0.4  0.3 - 1.2 mg/dL   GFR calc non Af Amer 79 (*) >90 mL/min   GFR calc Af Amer >90  >90 mL/min   Comment: (NOTE)     The eGFR has been calculated using the CKD EPI equation.     This calculation has not been validated in all  clinical situations.     eGFR's persistently <90 mL/min signify possible Chronic Kidney     Disease.   Anion gap 14  5 - 15  LIPASE, BLOOD     Status: None   Collection Time    12/11/13 10:17 PM      Result Value Ref Range   Lipase 16  11 - 59 U/L   Labs are reviewed see abnormal values above.  Medications reviewed and no changes made.  Current Facility-Administered Medications  Medication Dose Route Frequency Provider Last Rate Last Dose  . LORazepam (ATIVAN) tablet 1 mg  1 mg Oral Q8H PRN Abigail Butts, PA-C       Current Outpatient Prescriptions  Medication Sig Dispense Refill  . amantadine (SYMMETREL) 50 MG/5ML solution Take 100 mg by mouth 2 (two) times daily.      . bromocriptine (PARLODEL) 2.5 MG tablet Take 5 mg by mouth 2 (two) times daily.      . mirtazapine (REMERON) 15 MG tablet Take 15 mg by mouth at bedtime.       . ondansetron (ZOFRAN) 4 MG tablet Take 1 tablet (4 mg total) by mouth every 6 (six) hours.  12 tablet  0  . oxyCODONE-acetaminophen (PERCOCET/ROXICET) 5-325 MG per tablet Take 1-2 tablets by mouth every 6 (six) hours as needed for moderate pain or severe pain.  12 tablet  0  . polyethylene glycol (MIRALAX / GLYCOLAX) packet Take 17 g by mouth daily as needed for mild constipation.      . risperiDONE (RISPERDAL) 1 MG/ML oral solution Take 0.5 mg by mouth at bedtime.       . rivaroxaban (XARELTO) 20 MG TABS tablet Take 20 mg by mouth daily.        Psychiatric Specialty Exam: Unable to do PSE related to patient being unable to communicate and unable to determine if patient understood questions asked.  Musculoskeletal: Strength & Muscle Tone: spastic Gait & Station: Did not see patient ambulate. Patient in bed Patient leans: Patient was jerky   Treatment Plan Summary: Patient is cleared  psychiatrically.  Patient of [place] can determine when patient is medically cleared and discharged.  Recommend neurology consult of follow up and social work/case  management for assistance with placement.    Hurshel Bouillon, FNP-BC 12/12/2013 11:10 AM

## 2013-12-12 NOTE — Progress Notes (Signed)
  CARE MANAGEMENT ED NOTE 12/12/2013  Patient:  Hector [name],Hector [name]   Account Number:  [id number]  Date Initiated:  12/12/2013  Documentation initiated by:  Edd ArbourGIBBS,Crawford Tamura  Subjective/Objective Assessment:   43 yr old [place]icaid [place] access pt increased agitation-patient has TBI-was just seen yesterday for same complaint-patient had urinary retention yesterday and had to be cathed/patient indicating to EMS-pain in groin area     Subjective/Objective Assessment Detail:     Action/Plan:   ED CM consulted with ED SW about pt   Action/Plan Detail:   1630 ED Cm left a voice message for [facility] therapy staff to request PT evaluation   Anticipated DC Date:       Status Recommendation to Physician:   Result of Recommendation:    Other ED Services  Consult Working Plan   In-house referral  Clinical Social Worker   DC Associate [employer]lanning Services  Other    Choice offered to / List presented to:            Status of service:  Completed, signed off  ED Comments:   ED Comments Detail:

## 2013-12-12 NOTE — ED Notes (Signed)
Patient is exteremly agitated at this time. Patient HR 145 BP stable. Patient attempting to climb out of bed. MD made aware and discussed further plan for patient.

## 2013-12-12 NOTE — Consult Note (Signed)
Face to face evaluation and I agree with this note 

## 2013-12-12 NOTE — ED Notes (Signed)
Pt removed G tube.

## 2013-12-12 NOTE — ED Notes (Signed)
Pt seems agitated, fidgety, picking at sheets, constantly repositioning himself. Offered ativan PO which pt accepted. Updated pt on plan of care. Awaiting SW consult for long term placement.
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
## 2013-12-12 NOTE — Progress Notes (Addendum)
Per [name], PA  - patient will be seen face to face with the Psychiatrist in the morning for a final disposition.  Writer informed the ER MD Dr. Ranae PalmsYelverton and the nurse ([place]) of the patients disposition. Writer was unable to assess the patient because he had to be redirected by three nurses and then given Geodon.    [name],Hector Father [phone] [name],Hector Mother [phone] Home phone number is [phone].

## 2013-12-13 ENCOUNTER — Inpatient Hospital Stay (HOSPITAL_COMMUNITY): Payer: Medicaid Other

## 2013-12-13 ENCOUNTER — Emergency Department (HOSPITAL_COMMUNITY): Payer: Medicaid Other

## 2013-12-13 ENCOUNTER — Encounter (HOSPITAL_COMMUNITY): Payer: Self-pay | Admitting: Internal Medicine

## 2013-12-13 DIAGNOSIS — N200 Calculus of kidney: Secondary | ICD-10-CM | POA: Diagnosis present

## 2013-12-13 DIAGNOSIS — S069X9S Unspecified intracranial injury with loss of consciousness of unspecified duration, sequela: Secondary | ICD-10-CM

## 2013-12-13 DIAGNOSIS — I959 Hypotension, unspecified: Secondary | ICD-10-CM | POA: Diagnosis not present

## 2013-12-13 DIAGNOSIS — A419 Sepsis, unspecified organism: Secondary | ICD-10-CM | POA: Diagnosis present

## 2013-12-13 DIAGNOSIS — Z9181 History of falling: Secondary | ICD-10-CM | POA: Diagnosis not present

## 2013-12-13 DIAGNOSIS — J189 Pneumonia, unspecified organism: Secondary | ICD-10-CM | POA: Diagnosis present

## 2013-12-13 DIAGNOSIS — Z87820 Personal history of traumatic brain injury: Secondary | ICD-10-CM | POA: Diagnosis not present

## 2013-12-13 DIAGNOSIS — S069XAS Unspecified intracranial injury with loss of consciousness status unknown, sequela: Secondary | ICD-10-CM

## 2013-12-13 DIAGNOSIS — J9600 Acute respiratory failure, unspecified whether with hypoxia or hypercapnia: Secondary | ICD-10-CM | POA: Diagnosis present

## 2013-12-13 DIAGNOSIS — R4701 Aphasia: Secondary | ICD-10-CM | POA: Diagnosis present

## 2013-12-13 DIAGNOSIS — R0682 Tachypnea, not elsewhere classified: Secondary | ICD-10-CM | POA: Diagnosis not present

## 2013-12-13 DIAGNOSIS — F05 Delirium due to known physiological condition: Secondary | ICD-10-CM | POA: Diagnosis present

## 2013-12-13 DIAGNOSIS — R29898 Other symptoms and signs involving the musculoskeletal system: Secondary | ICD-10-CM | POA: Diagnosis present

## 2013-12-13 DIAGNOSIS — G9349 Other encephalopathy: Secondary | ICD-10-CM | POA: Diagnosis present

## 2013-12-13 DIAGNOSIS — I82729 Chronic embolism and thrombosis of deep veins of unspecified upper extremity: Secondary | ICD-10-CM | POA: Diagnosis present

## 2013-12-13 DIAGNOSIS — R6510 Systemic inflammatory response syndrome (SIRS) of non-infectious origin without acute organ dysfunction: Secondary | ICD-10-CM | POA: Diagnosis present

## 2013-12-13 DIAGNOSIS — IMO0002 Reserved for concepts with insufficient information to code with codable children: Secondary | ICD-10-CM | POA: Diagnosis present

## 2013-12-13 DIAGNOSIS — E871 Hypo-osmolality and hyponatremia: Secondary | ICD-10-CM | POA: Diagnosis not present

## 2013-12-13 DIAGNOSIS — G9340 Encephalopathy, unspecified: Secondary | ICD-10-CM

## 2013-12-13 DIAGNOSIS — F17200 Nicotine dependence, unspecified, uncomplicated: Secondary | ICD-10-CM | POA: Diagnosis present

## 2013-12-13 DIAGNOSIS — Z993 Dependence on wheelchair: Secondary | ICD-10-CM | POA: Diagnosis not present

## 2013-12-13 DIAGNOSIS — F603 Borderline personality disorder: Secondary | ICD-10-CM | POA: Diagnosis present

## 2013-12-13 DIAGNOSIS — Z72820 Sleep deprivation: Secondary | ICD-10-CM | POA: Diagnosis not present

## 2013-12-13 DIAGNOSIS — Z7901 Long term (current) use of anticoagulants: Secondary | ICD-10-CM | POA: Diagnosis not present

## 2013-12-13 DIAGNOSIS — D638 Anemia in other chronic diseases classified elsewhere: Secondary | ICD-10-CM | POA: Diagnosis present

## 2013-12-13 DIAGNOSIS — R1310 Dysphagia, unspecified: Secondary | ICD-10-CM | POA: Diagnosis not present

## 2013-12-13 DIAGNOSIS — Z87442 Personal history of urinary calculi: Secondary | ICD-10-CM | POA: Diagnosis not present

## 2013-12-13 DIAGNOSIS — Z8249 Family history of ischemic heart disease and other diseases of the circulatory system: Secondary | ICD-10-CM | POA: Diagnosis not present

## 2013-12-13 DIAGNOSIS — M6282 Rhabdomyolysis: Secondary | ICD-10-CM | POA: Diagnosis present

## 2013-12-13 DIAGNOSIS — E43 Unspecified severe protein-calorie malnutrition: Secondary | ICD-10-CM | POA: Diagnosis present

## 2013-12-13 DIAGNOSIS — R000 Tachycardia, unspecified: Secondary | ICD-10-CM | POA: Diagnosis present

## 2013-12-13 DIAGNOSIS — Z681 Body mass index (BMI) 19 or less, adult: Secondary | ICD-10-CM | POA: Diagnosis not present

## 2013-12-13 DIAGNOSIS — E876 Hypokalemia: Secondary | ICD-10-CM | POA: Diagnosis not present

## 2013-12-13 DIAGNOSIS — E870 Hyperosmolality and hypernatremia: Secondary | ICD-10-CM | POA: Diagnosis not present

## 2013-12-13 DIAGNOSIS — J438 Other emphysema: Secondary | ICD-10-CM | POA: Diagnosis present

## 2013-12-13 DIAGNOSIS — I82509 Chronic embolism and thrombosis of unspecified deep veins of unspecified lower extremity: Secondary | ICD-10-CM | POA: Diagnosis present

## 2013-12-13 DIAGNOSIS — D696 Thrombocytopenia, unspecified: Secondary | ICD-10-CM | POA: Diagnosis not present

## 2013-12-13 DIAGNOSIS — G40909 Epilepsy, unspecified, not intractable, without status epilepticus: Secondary | ICD-10-CM | POA: Diagnosis present

## 2013-12-13 DIAGNOSIS — Z79899 Other long term (current) drug therapy: Secondary | ICD-10-CM | POA: Diagnosis not present

## 2013-12-13 DIAGNOSIS — J9819 Other pulmonary collapse: Secondary | ICD-10-CM | POA: Diagnosis present

## 2013-12-13 LAB — BLOOD GAS, ARTERIAL
ACID-BASE DEFICIT: 3.3 mmol/L — AB (ref 0.0–2.0)
Bicarbonate: 20 mEq/L (ref 20.0–24.0)
DRAWN BY: 331471
O2 SAT: 92.6 %
PATIENT TEMPERATURE: 98.6
TCO2: 18.2 mmol/L (ref 0–100)
pCO2 arterial: 31.5 mmHg — ABNORMAL LOW (ref 35.0–45.0)
pH, Arterial: 7.418 (ref 7.350–7.450)
pO2, Arterial: 70.5 mmHg — ABNORMAL LOW (ref 80.0–100.0)

## 2013-12-13 LAB — CK: Total CK: 1022 U/L — ABNORMAL HIGH (ref 7–232)

## 2013-12-13 LAB — URINALYSIS, ROUTINE W REFLEX MICROSCOPIC
Glucose, UA: NEGATIVE mg/dL
Ketones, ur: 40 mg/dL — AB
Leukocytes, UA: NEGATIVE
Nitrite: NEGATIVE
PROTEIN: NEGATIVE mg/dL
Specific Gravity, Urine: 1.018 (ref 1.005–1.030)
UROBILINOGEN UA: 1 mg/dL (ref 0.0–1.0)
pH: 5 (ref 5.0–8.0)

## 2013-12-13 LAB — COMPREHENSIVE METABOLIC PANEL
ALK PHOS: 243 U/L — AB (ref 39–117)
ALT: 33 U/L (ref 0–53)
AST: 48 U/L — ABNORMAL HIGH (ref 0–37)
Albumin: 3.9 g/dL (ref 3.5–5.2)
Anion gap: 20 — ABNORMAL HIGH (ref 5–15)
BUN: 19 mg/dL (ref 6–23)
CHLORIDE: 101 meq/L (ref 96–112)
CO2: 22 mEq/L (ref 19–32)
Calcium: 10.6 mg/dL — ABNORMAL HIGH (ref 8.4–10.5)
Creatinine, Ser: 1.11 mg/dL (ref 0.50–1.35)
GFR, EST NON AFRICAN AMERICAN: 79 mL/min — AB (ref >90)
GLUCOSE: 92 mg/dL (ref 70–99)
POTASSIUM: 4.3 meq/L (ref 3.7–5.3)
Sodium: 143 mEq/L (ref 137–147)
Total Bilirubin: 0.8 mg/dL (ref 0.3–1.2)
Total Protein: 8.5 g/dL — ABNORMAL HIGH (ref 6.0–8.3)

## 2013-12-13 LAB — CBC WITH DIFFERENTIAL/PLATELET
Basophils Absolute: 0 10*3/uL (ref 0.0–0.1)
Basophils Relative: 0 % (ref 0–1)
Eosinophils Absolute: 0.1 10*3/uL (ref 0.0–0.7)
Eosinophils Relative: 0 % (ref 0–5)
HCT: 35.4 % — ABNORMAL LOW (ref 39.0–52.0)
Hemoglobin: 11.2 g/dL — ABNORMAL LOW (ref 13.0–17.0)
Lymphocytes Relative: 13 % (ref 12–46)
Lymphs Abs: 1.6 10*3/uL (ref 0.7–4.0)
MCH: 27.3 pg (ref 26.0–34.0)
MCHC: 31.6 g/dL (ref 30.0–36.0)
MCV: 86.1 fL (ref 78.0–100.0)
MONO ABS: 1.6 10*3/uL — AB (ref 0.1–1.0)
Monocytes Relative: 13 % — ABNORMAL HIGH (ref 3–12)
Neutro Abs: 9.3 10*3/uL — ABNORMAL HIGH (ref 1.7–7.7)
Neutrophils Relative %: 74 % (ref 43–77)
Platelets: 164 10*3/uL (ref 150–400)
RBC: 4.11 MIL/uL — ABNORMAL LOW (ref 4.22–5.81)
RDW: 15.5 % (ref 11.5–15.5)
WBC: 12.5 10*3/uL — ABNORMAL HIGH (ref 4.0–10.5)

## 2013-12-13 LAB — TROPONIN I

## 2013-12-13 LAB — URINE MICROSCOPIC-ADD ON

## 2013-12-13 LAB — MRSA PCR SCREENING: MRSA BY PCR: POSITIVE — AB

## 2013-12-13 LAB — PROCALCITONIN: PROCALCITONIN: 0.38 ng/mL

## 2013-12-13 LAB — AMMONIA: AMMONIA: 44 umol/L (ref 11–60)

## 2013-12-13 LAB — I-STAT CG4 LACTIC ACID, ED: LACTIC ACID, VENOUS: 2.38 mmol/L — AB (ref 0.5–2.2)

## 2013-12-13 LAB — PRO B NATRIURETIC PEPTIDE: PRO B NATRI PEPTIDE: 729.4 pg/mL — AB (ref 0–125)

## 2013-12-13 LAB — HIV ANTIBODY (ROUTINE TESTING W REFLEX): HIV 1&2 Ab, 4th Generation: NONREACTIVE

## 2013-12-13 LAB — STREP PNEUMONIAE URINARY ANTIGEN: Strep Pneumo Urinary Antigen: NEGATIVE

## 2013-12-13 MED ORDER — VALPROATE SODIUM 500 MG/5ML IV SOLN
500.0000 mg | Freq: Two times a day (BID) | INTRAVENOUS | Status: DC
  Administered 2013-12-13 – 2013-12-17 (×10): 500 mg via INTRAVENOUS
  Filled 2013-12-13 (×11): qty 5

## 2013-12-13 MED ORDER — DEXMEDETOMIDINE HCL IN NACL 200 MCG/50ML IV SOLN
0.2000 ug/kg/h | INTRAVENOUS | Status: AC
  Administered 2013-12-13 (×2): 1 ug/kg/h via INTRAVENOUS
  Administered 2013-12-13: 0.2 ug/kg/h via INTRAVENOUS
  Administered 2013-12-13: 0.3 ug/kg/h via INTRAVENOUS
  Administered 2013-12-13 (×2): 1 ug/kg/h via INTRAVENOUS
  Administered 2013-12-13: 0.4 ug/kg/h via INTRAVENOUS
  Administered 2013-12-13: 0.5 ug/kg/h via INTRAVENOUS
  Administered 2013-12-14: 0.8 ug/kg/h via INTRAVENOUS
  Administered 2013-12-14: 1 ug/kg/h via INTRAVENOUS
  Filled 2013-12-13 (×6): qty 50

## 2013-12-13 MED ORDER — ACETAMINOPHEN 650 MG RE SUPP: 650.0000 mg | Freq: Four times a day (QID) | RECTAL | Status: DC | PRN

## 2013-12-13 MED ORDER — ACETAMINOPHEN 325 MG PO TABS: 650.0000 mg | ORAL_TABLET | Freq: Four times a day (QID) | ORAL | Status: DC | PRN

## 2013-12-13 MED ORDER — LORAZEPAM 2 MG/ML IJ SOLN
1.0000 mg | Freq: Once | INTRAMUSCULAR | Status: AC
  Administered 2013-12-13: 1 mg via INTRAMUSCULAR
  Filled 2013-12-13: qty 1

## 2013-12-13 MED ORDER — VANCOMYCIN HCL IN DEXTROSE 750-5 MG/150ML-% IV SOLN
750.0000 mg | Freq: Two times a day (BID) | INTRAVENOUS | Status: DC
  Administered 2013-12-13: 750 mg via INTRAVENOUS
  Filled 2013-12-13: qty 150

## 2013-12-13 MED ORDER — SENNA 8.6 MG PO TABS: 1.0000 | ORAL_TABLET | Freq: Two times a day (BID) | ORAL | Status: DC

## 2013-12-13 MED ORDER — SODIUM CHLORIDE 0.9 % IJ SOLN
3.0000 mL | Freq: Two times a day (BID) | INTRAMUSCULAR | Status: DC
  Administered 2013-12-14 – 2014-01-03 (×31): 3 mL via INTRAVENOUS

## 2013-12-13 MED ORDER — ONDANSETRON HCL 4 MG PO TABS: 4.0000 mg | ORAL_TABLET | Freq: Four times a day (QID) | ORAL | Status: DC | PRN

## 2013-12-13 MED ORDER — LEVETIRACETAM IN NACL 1000 MG/100ML IV SOLN
1000.0000 mg | INTRAVENOUS | Status: AC
  Administered 2013-12-13: 1000 mg via INTRAVENOUS
  Filled 2013-12-13: qty 100

## 2013-12-13 MED ORDER — FENTANYL CITRATE 0.05 MG/ML IJ SOLN
25.0000 ug | INTRAMUSCULAR | Status: DC | PRN
  Administered 2013-12-14 – 2013-12-15 (×7): 50 ug via INTRAVENOUS
  Filled 2013-12-13 (×7): qty 2

## 2013-12-13 MED ORDER — PIPERACILLIN-TAZOBACTAM 3.375 G IVPB 30 MIN
3.3750 g | Freq: Once | INTRAVENOUS | Status: DC
  Administered 2013-12-13: 3.375 g via INTRAVENOUS
  Filled 2013-12-13: qty 50

## 2013-12-13 MED ORDER — HYDROCODONE-ACETAMINOPHEN 5-325 MG PO TABS: 1.0000 | ORAL_TABLET | ORAL | Status: DC | PRN

## 2013-12-13 MED ORDER — SODIUM CHLORIDE 0.9 % IV BOLUS (SEPSIS)
1000.0000 mL | Freq: Once | INTRAVENOUS | Status: AC
  Administered 2013-12-13: 1000 mL via INTRAVENOUS

## 2013-12-13 MED ORDER — SODIUM CHLORIDE 0.9 % IV SOLN
INTRAVENOUS | Status: AC
  Administered 2013-12-14: 04:00:00 via INTRAVENOUS

## 2013-12-13 MED ORDER — PIPERACILLIN-TAZOBACTAM 3.375 G IVPB 30 MIN
3.3750 g | Freq: Three times a day (TID) | INTRAVENOUS | Status: DC
  Filled 2013-12-13: qty 50

## 2013-12-13 MED ORDER — LORAZEPAM 2 MG/ML IJ SOLN
2.0000 mg | Freq: Once | INTRAMUSCULAR | Status: AC
  Administered 2013-12-13: 2 mg via INTRAVENOUS

## 2013-12-13 MED ORDER — DOCUSATE SODIUM 100 MG PO CAPS: 100.0000 mg | ORAL_CAPSULE | Freq: Two times a day (BID) | ORAL | Status: DC

## 2013-12-13 MED ORDER — LORAZEPAM 2 MG/ML IJ SOLN: 1.0000 mg | Freq: Four times a day (QID) | INTRAMUSCULAR | Status: DC | PRN

## 2013-12-13 MED ORDER — POLYETHYLENE GLYCOL 3350 17 G PO PACK: 17.0000 g | PACK | Freq: Every day | ORAL | Status: DC | PRN

## 2013-12-13 MED ORDER — HYDROMORPHONE HCL PF 1 MG/ML IJ SOLN: 1.0000 mg | INTRAMUSCULAR | Status: DC | PRN

## 2013-12-13 MED ORDER — ACETAMINOPHEN 650 MG RE SUPP
650.0000 mg | Freq: Once | RECTAL | Status: AC
  Administered 2013-12-13: 650 mg via RECTAL
  Filled 2013-12-13: qty 1

## 2013-12-13 MED ORDER — LORAZEPAM 2 MG/ML IJ SOLN
1.0000 mg | Freq: Once | INTRAMUSCULAR | Status: AC
  Administered 2013-12-13: 1 mg via INTRAVENOUS
  Filled 2013-12-13: qty 1

## 2013-12-13 MED ORDER — LORAZEPAM 2 MG/ML IJ SOLN
INTRAMUSCULAR | Status: AC
  Filled 2013-12-13: qty 1

## 2013-12-13 MED ORDER — VANCOMYCIN HCL IN DEXTROSE 1-5 GM/200ML-% IV SOLN
1000.0000 mg | Freq: Once | INTRAVENOUS | Status: AC
  Administered 2013-12-13: 1000 mg via INTRAVENOUS
  Filled 2013-12-13: qty 200

## 2013-12-13 MED ORDER — SODIUM CHLORIDE 0.9 % IV SOLN
INTRAVENOUS | Status: AC
  Administered 2013-12-13 (×2): via INTRAVENOUS

## 2013-12-13 MED ORDER — FLEET ENEMA 7-19 GM/118ML RE ENEM: 1.0000 | ENEMA | Freq: Every day | RECTAL | Status: DC | PRN

## 2013-12-13 MED ORDER — ONDANSETRON HCL 4 MG/2ML IJ SOLN: 4.0000 mg | Freq: Four times a day (QID) | INTRAMUSCULAR | Status: DC | PRN

## 2013-12-13 MED ORDER — SODIUM CHLORIDE 0.9 % IV BOLUS (SEPSIS): 1000.0000 mL | Freq: Once | INTRAVENOUS | Status: DC

## 2013-12-13 MED ORDER — PANTOPRAZOLE SODIUM 40 MG IV SOLR
40.0000 mg | Freq: Every day | INTRAVENOUS | Status: DC
  Administered 2013-12-13: 40 mg via INTRAVENOUS
  Filled 2013-12-13: qty 40

## 2013-12-13 NOTE — ED Provider Notes (Signed)
Patient signed out with plan to follow up social work and placement. Patient has a history of traumatic brain injury and difficulty obtaining history due to mental status.  During my shift nurse notified me of persistent tachycardia and low-grade fever.  No recent blood work done, repeat blood work, urine, chest x-ray, cultures for possible septic workup.  I examined the patient had mild combativeness which per report was similar to when he arrived, dry mucous membranes, skin warm to touch, mild erythema at bilateral elbows without induration or sign of active infection likely from persistent movement in the bed, no significant leg swelling, heart rate tachycardia, lungs clear bilateral, no obvious heart murmurs appreciated, neck supple, pupils equal. Infectious workup pending. Difficult neuro exam to 2 TBI history however with multiple different medications neuroleptic malignant syndrome and sick sinus syndrome are also on the differential.  CXR showed pneumonia, HCAP abx and cultures, fluid bolus.  Discussed with triad for intermediate. The patients results and plan were reviewed and discussed.   Any x-rays performed were personally reviewed by myself.  Ct Abdomen Pelvis W Contrast  12/12/2013   CLINICAL DATA:  Mid abdominal pain.  Agitation.  Poor historian.  EXAM: CT ABDOMEN AND PELVIS WITH CONTRAST  TECHNIQUE: Multidetector CT imaging of the abdomen and pelvis was performed using the standard protocol following bolus administration of intravenous contrast.  CONTRAST:  50mL OMNIPAQUE IOHEXOL 300 MG/ML SOLN, 80mL OMNIPAQUE IOHEXOL 300 MG/ML SOLN  COMPARISON:  Retroperitoneal ultrasound December 10, 2013 trauma CT of the chest December 10, 2013  FINDINGS: LUNG BASES: Ground-glass opacities in the lingula, atelectasis in the lung bases. Visualized heart and pericardium are unremarkable. Please see CT of chest from December 10, 2013  SOLID ORGANS: The liver, spleen, pancreas and adrenal glands are  unremarkable. Nonvisualized gallbladder likely representing cholecystectomy.  GASTROINTESTINAL TRACT: Gastrostomy tube in place, intraluminal retaining fall. The stomach, small and large bowel are normal in course and caliber without inflammatory changes. Moderate amount of retained large bowel stool. The appendix is not discretely identified, however there are no inflammatory changes in the right lower quadrant.  KIDNEYS/ URINARY TRACT: Kidneys are orthotopic, demonstrating symmetric enhancement. Punctate right lower pole, 2 mm left interpolar, 3 mm left lower pole nephrolithiasis. No hydronephrosis or solid renal masses. The unopacified ureters are normal in course and caliber. Delayed imaging through the kidneys demonstrates symmetric prompt contrast excretion within the proximal urinary collecting system. Urinary bladder is well distended, dense with layering contrast from CT chest 1 day prior.  PERITONEUM/RETROPERITONEUM: No intraperitoneal free fluid nor free air. Aortoiliac vessels are normal in course and caliber, mild calcific atherosclerosis. No lymphadenopathy by CT size criteria. Internal reproductive organs are unremarkable.  SOFT TISSUE/OSSEOUS STRUCTURES: Nonsuspicious.  IMPRESSION: Moderate amount of retained large bowel stool. Gastrostomy tube in place. No acute intra-abdominal or pelvic process.  Bilateral nonobstructing nephrolithiasis measuring up to 3 mm.   Electronically Signed   By: Awilda Metro   On: 12/12/2013 00:24   Dg Chest Portable 1 View  12/13/2013   CLINICAL DATA:  Altered mental status, history of seizures and agitation.  EXAM: PORTABLE CHEST - 1 VIEW  COMPARISON:  CT of the chest December 10, 2013  FINDINGS: Diffuse interstitial prominence, confluent in the right upper and midlung zones. No pleural effusions. No pneumothorax. Cardiomediastinal silhouette is unremarkable. No pneumothorax. Soft tissue planes are nonsuspicious, moderate mid thoracic compression fracture, present  on prior imaging.  IMPRESSION: Diffuse interstitial prominence, somewhat confluent within the right upper lobe and  right midlung zone, this could be infectious/pneumonia or inflammatory.   Electronically Signed   By: Awilda Metroourtnay  Bloomer   On: 12/13/2013 05:04    Differential diagnosis were considered with the presenting HPI.  Medications  LORazepam (ATIVAN) tablet 1 mg (1 mg Oral Given 12/12/13 1153)  ziprasidone (GEODON) 20 MG injection (  Canceled Entry 12/12/13 1257)  sterile water (preservative free) injection (  Not Given 12/12/13 1257)  amantadine (SYMMETREL) solution 100 mg (100 mg Oral Given 12/12/13 2016)  bromocriptine (PARLODEL) tablet 5 mg (5 mg Oral Given 12/13/13 0052)  mirtazapine (REMERON) tablet 15 mg (15 mg Oral Given 12/13/13 0052)  oxyCODONE-acetaminophen (PERCOCET/ROXICET) 5-325 MG per tablet 1-2 tablet (not administered)  risperiDONE (RISPERDAL) 1 MG/ML oral solution 0.5 mg (0.5 mg Oral Given 12/12/13 2154)  rivaroxaban (XARELTO) tablet 20 mg (20 mg Oral Given 12/13/13 0052)  sterile water (preservative free) injection (not administered)  piperacillin-tazobactam (ZOSYN) IVPB 3.375 g (not administered)  vancomycin (VANCOCIN) 15 mg/kg in sodium chloride 0.9 % 100 mL IVPB (not administered)  iohexol (OMNIPAQUE) 300 MG/ML solution 50 mL (50 mLs Oral Contrast Given 12/11/13 2249)  iohexol (OMNIPAQUE) 300 MG/ML solution 100 mL (80 mLs Intravenous Contrast Given 12/11/13 2345)  ziprasidone (GEODON) injection 20 mg (20 mg Intramuscular Given 12/12/13 0236)  LORazepam (ATIVAN) tablet 1 mg (1 mg Oral Given 12/12/13 1256)  ziprasidone (GEODON) injection 20 mg (20 mg Intramuscular Given 12/12/13 1507)  sterile water (preservative free) injection (  Given 12/12/13 1507)  LORazepam (ATIVAN) injection 1 mg (1 mg Intramuscular Given 12/12/13 1739)  LORazepam (ATIVAN) injection 2 mg (2 mg Intramuscular Given 12/12/13 2120)  acetaminophen (TYLENOL) suppository 650 mg (650 mg Rectal Given 12/13/13 0445)   LORazepam (ATIVAN) injection 1 mg (1 mg Intravenous Given 12/13/13 0530)  sodium chloride 0.9 % bolus 1,000 mL (1,000 mLs Intravenous New Bag/Given 12/13/13 0530)     Filed Vitals:   12/12/13 2054 12/13/13 0059 12/13/13 0543 12/13/13 0543  BP: 121/53 134/99 101/52   Pulse: 128 137 136   Temp:  97.9 F (36.6 C)    TempSrc: Oral Oral    Resp: 18  30   Height:    5\' 7"  (1.702 m)  Weight:    120 lb (54.432 kg)  SpO2: 96% 96% 92%     Admission/ observation were discussed with the admitting physician, patient and/or family and they are comfortable with the plan.   Fever, agitation, tachycardia  Enid SkeensJoshua M Catina Nuss, MD 12/13/13 [phone]

## 2013-12-13 NOTE — Care Management Note (Signed)
CARE MANAGEMENT NOTE 12/13/2013  Patient:  Hector [name],Hector [name]   Account Number:  [id number]  Date Initiated:  12/13/2013  Documentation initiated by:  Berenda MoraleHEFNER,Asser Lucena  Subjective/Objective Assessment:   See ED Case Management Note     Action/Plan:   Anticipated DC Date:  12/17/2013   Anticipated DC Plan:        DC Planning Services  CM consult      Choice offered to / List presented to:             Status of service:  In process, will continue to follow Medicare Important Message given?   (If response is "NO", the following Medicare IM given date fields will be blank) Date Medicare IM given:   Medicare IM given by:   Date Additional Medicare IM given:   Additional Medicare IM given by:    Discharge Disposition:    Per UR Regulation:  Reviewed for med. necessity/level of care/duration of stay  If discussed at Long Length of Stay Meetings, dates discussed:    Comments:  12/13/13 Sandford CrazeNora Danya Spearman Rn, BSN, NCM Will await PT recommendations and assist with DC needs.

## 2013-12-13 NOTE — ED Notes (Signed)
Attempted to call report, but receiving nurse in report.

## 2013-12-13 NOTE — Progress Notes (Signed)
Attempted to return Sheila's phone call to receive report but no answer. Will attempt to call again. Samara DeistKathryn RN, [phone]

## 2013-12-13 NOTE — Progress Notes (Signed)
ANTIBIOTIC CONSULT NOTE - INITIAL  Pharmacy Consult for Vancomycin Indication: rule out sepsis  No Known Allergies  Patient Measurements: Height: 5\' 7"  (170.2 cm) Weight: 120 lb (54.432 kg) IBW/kg (Calculated) : 66.1  Vital Signs: Temp: 97.9 F (36.6 C) (08/20 0059) Temp src: Oral (08/20 0059) BP: 84/68 mmHg (08/20 0756) Pulse Rate: 125 (08/20 0756) Intake/Output from previous day: 08/19 0701 - 08/20 0700 In: -  Out: 400 [Urine:400] Intake/Output from this shift:    Labs:  Recent Labs  12/10/13 1952 12/11/13 2217 12/13/13 0446  WBC 8.9 7.6 12.5*  HGB 11.2* 10.9* 11.2*  PLT 152 144* 164  CREATININE 0.97 1.11 1.11   Estimated Creatinine Clearance: 64.7 ml/min (by C-G formula based on Cr of 1.11). No results found for this basename: Rolm GalaVANCOTROUGH, VANCOPEAK, VANCORANDOM, GENTTROUGH, GENTPEAK, GENTRANDOM, TOBRATROUGH, TOBRAPEAK, TOBRARND, AMIKACINPEAK, AMIKACINTROU, AMIKACIN,  in the last 72 hours   Microbiology: Recent Results (from the past 720 hour(s))  URINE CULTURE     Status: None   Collection Time    12/10/13  9:49 PM      Result Value Ref Range Status   Specimen Description URINE, CATHETERIZED   Final   Special Requests NONE   Final   Culture  Setup Time     Final   Value: 12/11/2013 02:44     Performed at [facility]   Colony Count     Final   Value: NO GROWTH     [facility]formed at [facility]   Culture     Final   Value: NO GROWTH     Performed at [facility]   Report Status 12/12/2013 FINAL   Final    Medical History: Past Medical History  Diagnosis Date  . TBI (traumatic brain injury)   . Seizure     Assessment: [age] yoM with h/o traumatic brain injury, seizures. Admitted for AMS, agitation, CT of abdomen showed nonobstructive nephrolithiasis. In ED x 3 days awaiting placement. Became febrile and hypotensive overnight. Pharmacy consulted to dose vanco for sepsis secondary to presumed HCAP.  8/20 >>Zosyn >> 8/20 >>Vanco  >>   Tmax: afebrile WBCs: 12.5 trending up Renal: SCr 1.11 CrCl 64 (N 85)  Goal of Therapy:  Vancomycin trough level 15-20 mcg/ml  Plan:  Start Vancomycin 750mg  IV Q12H Measure antibiotic drug levels at steady state Follow up culture results  Loma BostonLaura Jalaiya Oyster, PharmD Pager: [phone] 12/13/2013 9:09 AM
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
## 2013-12-13 NOTE — ED Notes (Signed)
Notified EDP, Zavitz,MD.,pt. i-stat CG4 results 2.38.

## 2013-12-13 NOTE — Progress Notes (Signed)
PT Cancellation Note  Patient Details Name: Lavone NianRobert W Masi MRN: [id number] DOB: 1970/07/17   Cancelled Treatment:    Reason Eval/Treat Not Completed: Patient not medically ready (Has moved to ICU for sepsis/ increased HR/decreased BP.)   Rada HayHill, Markee Remlinger Elizabeth 12/13/2013, 9:20 AM Blanchard KelchKaren Nicola Heinemann PT [phone]

## 2013-12-13 NOTE — H&P (Signed)
PCP:   Mariea ClontsFOSTER, CHRISTOPHER B, NP    Chief Complaint:  aggitation  HPI: Hector [name] is a 43 y.o. male   has a past medical history of TBI (traumatic brain injury) and Seizure.   Presented with  Patient unable to provide his own history per records he has history of traumatic brain injury after a seizure episode that occurred in April this year. Patient was comatose for prolonged period of time and then regained consciousness he was no longer verbal. Patient was discharged to home but family states have been aggressive towards them. Patient now requires 24-hour care. Family states that 3 days ago patient started to become agitated grabbing at the right flank/side. Patient was brought to emergency department he had a CT injury chest done which did not show any evidence of PE but did show groundglass opacities. CT scan of the abdomen showed nonobstructive nephrolithiasis. Patient also had urinary sound done as well that showed no hydronephrosis. Patient was supposed to be admitted for placement. He remained in emergency department for the next 3 days. today patient was noted to be warm temperature was taken on his noted to be elevated up to 101. A chest x-ray was done worrisome for infiltrate. Patient continues to be confused.    Hospitalist was called for admission for hcap  Review of Systems:  Unable to obtain as patient is confused  Past Medical History: Past Medical History  Diagnosis Date  . TBI (traumatic brain injury)   . Seizure    History reviewed. No pertinent past surgical history.   Medications: Prior to Admission medications   Medication Sig Start Date End Date Taking? Authorizing Provider  amantadine (SYMMETREL) 50 MG/5ML solution Take 100 mg by mouth 2 (two) times daily.   Yes Historical Provider, MD  bromocriptine (PARLODEL) 2.5 MG tablet Take 5 mg by mouth 2 (two) times daily.   Yes Historical Provider, MD  mirtazapine (REMERON) 15 MG tablet Take 15 mg by mouth  at bedtime.  12/07/13 01/06/14 Yes Historical Provider, MD  ondansetron (ZOFRAN) 4 MG tablet Take 1 tablet (4 mg total) by mouth every 6 (six) hours. 12/10/13  Yes Mora BellmanHannah S Merrell, PA-C  oxyCODONE-acetaminophen (PERCOCET/ROXICET) 5-325 MG per tablet Take 1-2 tablets by mouth every 6 (six) hours as needed for moderate pain or severe pain. 12/10/13  Yes Mora BellmanHannah S Merrell, PA-C  polyethylene glycol (MIRALAX / GLYCOLAX) packet Take 17 g by mouth daily as needed for mild constipation.   Yes Historical Provider, MD  risperiDONE (RISPERDAL) 1 MG/ML oral solution Take 0.5 mg by mouth at bedtime.  12/10/13 01/09/14 Yes Historical Provider, MD  rivaroxaban (XARELTO) 20 MG TABS tablet Take 20 mg by mouth daily.   Yes Historical Provider, MD    Allergies:  No Known Allergies  Social History:  Ambulatory wheelchair bound Lives at home  With family    reports that he has quit smoking. He does not have any smokeless tobacco history on file. He reports that he does not drink alcohol or use illicit drugs.    Family History: Family history is unknown by patient.  patient unable to provide secondary to mental status   Physical Exam: Patient Vitals for the past 24 hrs:  BP Temp Temp src Pulse Resp SpO2 Height Weight  12/13/13 0543 - - - - - - 5\' 7"  (1.702 m) 54.432 kg (120 lb)  12/13/13 0543 101/52 mmHg - - 136 30 92 % - -  12/13/13 0059 134/99 mmHg 97.9 F (  36.6 C) Oral 137 - 96 % - -  12/12/13 2054 121/53 mmHg - Oral 128 18 96 % - -  12/12/13 1516 112/95 mmHg - - 130 20 96 % - -  12/12/13 1451 124/70 mmHg - - 136 20 95 % - -  12/12/13 1403 121/77 mmHg - - 127 18 100 % - -  12/12/13 1400 121/77 mmHg - - 129 14 100 % - -  12/12/13 1158 118/59 mmHg 97.7 F (36.5 C) Oral 120 - 95 % - -  12/12/13 0958 112/68 mmHg - - 104 16 98 % - -  12/12/13 0844 112/74 mmHg - - 96 14 90 % - -  12/12/13 0652 156/68 mmHg - - 106 18 95 % - -    1. General:  in No Acute distress 2. Psychological: somnolent not   Oriented 3. Head/ENT:  Dry Mucous Membranes                          Head Non traumatic, neck supple                          Poor Dentition 4. SKIN:  decreased Skin turgor,  Skin clean Dry and intact no rash 5. Heart:rapid but  Regular rate and rhythm no Murmur, Rub or gallop 6. Lungs: no wheezes or crackles  Coarse breathsounds 7. Abdomen: Soft, non-tender, Non distended 8. Lower extremities: no clubbing, cyanosis, or edema 9. Neurologically Grossly intact, moving all 4 extremities equally unable to perform detailed neurological exam. Patient has mittens on his hands flailing randomly swapping at me  10. MSK: Normal range of motion  body mass index is 18.79 kg/(m^2).   Labs on Admission:   Recent Labs  12/11/13 2217 12/13/13 0446  NA 140 143  K 4.6 4.3  CL 100 101  CO2 26 22  GLUCOSE 127* 92  BUN 21 19  CREATININE 1.11 1.11  CALCIUM 10.4 10.6*    Recent Labs  12/11/13 2217 12/13/13 0446  AST 41* 48*  ALT 35 33  ALKPHOS 236* 243*  BILITOT 0.4 0.8  PROT 8.1 8.5*  ALBUMIN 3.7 3.9    Recent Labs  12/10/13 1952 12/11/13 2217  LIPASE 13 16    Recent Labs  12/11/13 2217 12/13/13 0446  WBC 7.6 12.5*  NEUTROABS 5.1 9.3*  HGB 10.9* 11.2*  HCT 33.5* 35.4*  MCV 85.2 86.1  PLT 144* 164   No results found for this basename: CKTOTAL, CKMB, CKMBINDEX, TROPONINI,  in the last 72 hours No results found for this basename: TSH, T4TOTAL, FREET3, T3FREE, THYROIDAB,  in the last 72 hours No results found for this basename: VITAMINB12, FOLATE, FERRITIN, TIBC, IRON, RETICCTPCT,  in the last 72 hours No results found for this basename: HGBA1C    Estimated Creatinine Clearance: 64.7 ml/min (by C-G formula based on Cr of 1.11). ABG No results found for this basename: phart, pco2, po2, hco3, tco2, acidbasedef, o2sat     No results found for this basename: DDIMER     Other results:   UA  no evidence of UTI   BNP (last 3 results) No results found for this basename:  PROBNP,  in the last 8760 hours  Filed Weights   12/13/13 0543  Weight: 54.432 kg (120 lb)     Cultures:    Component Value Date/Time   SDES URINE, CATHETERIZED 12/10/2013 2149   SPECREQUEST NONE 12/10/2013 2149  CULT  Value: NO GROWTH Performed at [facility] 12/10/2013 2149   REPTSTATUS 12/12/2013 FINAL 12/10/2013 2149    Radiological Exams on Admission: Ct Abdomen Pelvis W Contrast  12/12/2013   CLINICAL DATA:  Mid abdominal pain.  Agitation.  Poor historian.  EXAM: CT ABDOMEN AND PELVIS WITH CONTRAST  TECHNIQUE: Multidetector CT imaging of the abdomen and pelvis was performed using the standard protocol following bolus administration of intravenous contrast.  CONTRAST:  50mL OMNIPAQUE IOHEXOL 300 MG/ML SOLN, 80mL OMNIPAQUE IOHEXOL 300 MG/ML SOLN  COMPARISON:  Retroperitoneal ultrasound December 10, 2013 trauma CT of the chest December 10, 2013  FINDINGS: LUNG BASES: Ground-glass opacities in the lingula, atelectasis in the lung bases. Visualized heart and pericardium are unremarkable. Please see CT of chest from December 10, 2013  SOLID ORGANS: The liver, spleen, pancreas and adrenal glands are unremarkable. Nonvisualized gallbladder likely representing cholecystectomy.  GASTROINTESTINAL TRACT: Gastrostomy tube in place, intraluminal retaining fall. The stomach, small and large bowel are normal in course and caliber without inflammatory changes. Moderate amount of retained large bowel stool. The appendix is not discretely identified, however there are no inflammatory changes in the right lower quadrant.  KIDNEYS/ URINARY TRACT: Kidneys are orthotopic, demonstrating symmetric enhancement. Punctate right lower pole, 2 mm left interpolar, 3 mm left lower pole nephrolithiasis. No hydronephrosis or solid renal masses. The unopacified ureters are normal in course and caliber. Delayed imaging through the kidneys demonstrates symmetric prompt contrast excretion within the proximal urinary collecting  system. Urinary bladder is well distended, dense with layering contrast from CT chest 1 day prior.  PERITONEUM/RETROPERITONEUM: No intraperitoneal free fluid nor free air. Aortoiliac vessels are normal in course and caliber, mild calcific atherosclerosis. No lymphadenopathy by CT size criteria. Internal reproductive organs are unremarkable.  SOFT TISSUE/OSSEOUS STRUCTURES: Nonsuspicious.  IMPRESSION: Moderate amount of retained large bowel stool. Gastrostomy tube in place. No acute intra-abdominal or pelvic process.  Bilateral nonobstructing nephrolithiasis measuring up to 3 mm.   Electronically Signed   By: Awilda Metro   On: 12/12/2013 00:24   Dg Chest Portable 1 View  12/13/2013   CLINICAL DATA:  Altered mental status, history of seizures and agitation.  EXAM: PORTABLE CHEST - 1 VIEW  COMPARISON:  CT of the chest December 10, 2013  FINDINGS: Diffuse interstitial prominence, confluent in the right upper and midlung zones. No pleural effusions. No pneumothorax. Cardiomediastinal silhouette is unremarkable. No pneumothorax. Soft tissue planes are nonsuspicious, moderate mid thoracic compression fracture, present on prior imaging.  IMPRESSION: Diffuse interstitial prominence, somewhat confluent within the right upper lobe and right midlung zone, this could be infectious/pneumonia or inflammatory.   Electronically Signed   By: Awilda Metro   On: 12/13/2013 05:04    Chart has been reviewed  Assessment/Plan  43 year old gentleman history of traumatic brain injury shared with presumed sepsis secondary to pneumonia complicated by worsening agitation  Present on Admission:  . TBI (traumatic brain injury) - patient will likely need to be placed after his medically cleared family is no longer able to take care of him  . Delirium due to general medical condition will control Ativan when necessary since patient responded well  . HCAP (healthcare-associated pneumonia) call [facility] Zosyn and vancomycin  patient has been in the emergency department for Past 3 days.   delirium likely multifactorial. Evaluate for obstipation give bowel regimen. We'll also place a Foley this patient have had history of urinary retention in the past  . Sepsis admit to step down administer  IV fluids and IV antibiotics   Prophylaxis:  Lovenox, Protonix  CODE STATUS:  FULL CODE presumed to be full code  Other plan as per orders.  I have spent a total of 55 min on this admission  Alpha Mysliwiec 12/13/2013, 6:29 AM  [facility]  Pager [phone]   If 7AM-7PM, please contact the day team taking care of the patient  [email or web address]  Password [email or web address]

## 2013-12-13 NOTE — Clinical Documentation Improvement (Signed)
According to the documented Height and Weight in [facility]/EPIC, the patients BMI is 18.8. If your clinical findings/judgment agrees with this,if possible could you please help clarify any clinically significant dianosis in the progress note?  Please document any associated clinically significant diagnoses/conditions, such as underweight,malnutrition, etc.  Supporting Information: - Patient is describe as a 'thin adult male' in the General section of the Physical Assessment by Heber CarolinaBrent McQuaid on 8/20. - The same note states that the patient has had a hard time feeding himself with the constant jerking movements   Thank You,   Servando SnareSalena Kandice Schmelter, RN Clinical Documentation Improvement Specialist (CDIS) [phone] / [phone]

## 2013-12-13 NOTE — Consult Note (Signed)
Reason for Consult: Altered mental status.  HPI:                                                                                                                                          Hector [name] is an 43 y.o. male history of traumatic brain injury and seizures as well as DVT, brought to the emergency room 3 days ago for acute agitation and possible involuntary commitment. Seizure and brain injury occurred in April of this year. Patient had a prolonged period of unconsciousness and required surgical intervention. While waiting for admission from the emergency room patient developed fever and abnormality on chest x-ray indicative of possible acute pneumonia. Neurology consultation was obtained because of patient's reduced responsiveness as well as abnormal movements of extremity, with concern for possible seizure activity. CT scan of the head is pending. Patient was given IV Ativan as well as a loading dose of Keppra 1 g IV for possible seizure activity. He has required a sitter for agitation. His routine medications include Risperdal as well as oxycodone and Remeron. Patient has also been on Symmetrel and Parlodel. Urine drug screen has not been done. Are you and  Past Medical History  Diagnosis Date  . 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 Seizure   . DVT (deep venous thrombosis)     UE, LE DVT during hospitalization  . H/O renal calculi     History reviewed. No pertinent past surgical history.  Family History  Problem Relation Age of Onset  . Congestive Heart Failure Brother     Deceased age 41    Social History:  reports that he has been smoking.  He has never used smokeless tobacco. He reports that he does not drink alcohol or use illicit drugs.  Allergies  Allergen Reactions  . Ativan [Lorazepam]     Pt has paradoxical effect with sedating medications.     MEDICATIONS:                                                                                                                      I have reviewed the patient's current medications.   ROS:  History obtained from unobtainable from patient due to mental status   Blood pressure 84/68, pulse 125, temperature 97.9 F (36.6 C), temperature source Oral, resp. rate 30, height 5' 7"  (1.702 m), weight 54.432 kg (120 lb), SpO2 92.00%.   Neurologic Examination:                                                                                                      Patient was somewhat lethargic and markedly agitated in 4 point restraints. He had very infrequent verbal output which was unintelligible. Pupils were equal and reacted normally to light. Extraocular movements were full on right and left lateral gaze, and were conjugate. No facial weakness was noted. Strength of extremities was normal throughout. Deep tendon reflexes were 2+ and symmetrical. Plantar responses were flexor bilaterally.  No results found for this basename: cbc, bmp, coags, chol, tri, ldl, hga1c    Results for orders placed during the hospital encounter of 12/11/13 (from the past 48 hour(s))  CBC WITH DIFFERENTIAL     Status: Abnormal   Collection Time    12/11/13 10:17 PM      Result Value Ref Range   WBC 7.6  4.0 - 10.5 K/uL   RBC 3.93 (*) 4.22 - 5.81 MIL/uL   Hemoglobin 10.9 (*) 13.0 - 17.0 g/dL   HCT 33.5 (*) 39.0 - 52.0 %   MCV 85.2  78.0 - 100.0 fL   MCH 27.7  26.0 - 34.0 pg   MCHC 32.5  30.0 - 36.0 g/dL   RDW 15.4  11.5 - 15.5 %   Platelets 144 (*) 150 - 400 K/uL   Neutrophils Relative % 67  43 - 77 %   Neutro Abs 5.1  1.7 - 7.7 K/uL   Lymphocytes Relative 20  12 - 46 %   Lymphs Abs 1.5  0.7 - 4.0 K/uL   Monocytes Relative 8  3 - 12 %   Monocytes Absolute 0.6  0.1 - 1.0 K/uL   Eosinophils Relative 4  0 - 5 %   Eosinophils Absolute 0.3  0.0 - 0.7 K/uL    Basophils Relative 1  0 - 1 %   Basophils Absolute 0.0  0.0 - 0.1 K/uL  COMPREHENSIVE METABOLIC PANEL     Status: Abnormal   Collection Time    12/11/13 10:17 PM      Result Value Ref Range   Sodium 140  137 - 147 mEq/L   Potassium 4.6  3.7 - 5.3 mEq/L   Chloride 100  96 - 112 mEq/L   CO2 26  19 - 32 mEq/L   Glucose, Bld 127 (*) 70 - 99 mg/dL   BUN 21  6 - 23 mg/dL   Creatinine, Ser 1.11  0.50 - 1.35 mg/dL   Calcium 10.4  8.4 - 10.5 mg/dL   Total Protein 8.1  6.0 - 8.3 g/dL   Albumin 3.7  3.5 - 5.2 g/dL   AST 41 (*) 0 - 37 U/L   ALT 35  0 - 53 U/L   Alkaline Phosphatase 236 (*) 39 -  117 U/L   Total Bilirubin 0.4  0.3 - 1.2 mg/dL   GFR calc non Af Amer 79 (*) >90 mL/min   GFR calc Af Amer >90  >90 mL/min   Comment: (NOTE)     The eGFR has been calculated using the CKD EPI equation.     This calculation has not been validated in all clinical situations.     eGFR's persistently <90 mL/min signify possible Chronic Kidney     Disease.   Anion gap 14  5 - 15  LIPASE, BLOOD     Status: None   Collection Time    12/11/13 10:17 PM      Result Value Ref Range   Lipase 16  11 - 59 U/L  URINALYSIS, ROUTINE W REFLEX MICROSCOPIC     Status: Abnormal   Collection Time    12/13/13  4:46 AM      Result Value Ref Range   Color, Urine YELLOW  YELLOW   APPearance CLOUDY (*) CLEAR   Specific Gravity, Urine 1.018  1.005 - 1.030   pH 5.0  5.0 - 8.0   Glucose, UA NEGATIVE  NEGATIVE mg/dL   Hgb urine dipstick MODERATE (*) NEGATIVE   Bilirubin Urine MODERATE (*) NEGATIVE   Ketones, ur 40 (*) NEGATIVE mg/dL   Protein, ur NEGATIVE  NEGATIVE mg/dL   Urobilinogen, UA 1.0  0.0 - 1.0 mg/dL   Nitrite NEGATIVE  NEGATIVE   Leukocytes, UA NEGATIVE  NEGATIVE  CBC WITH DIFFERENTIAL     Status: Abnormal   Collection Time    12/13/13  4:46 AM      Result Value Ref Range   WBC 12.5 (*) 4.0 - 10.5 K/uL   RBC 4.11 (*) 4.22 - 5.81 MIL/uL   Hemoglobin 11.2 (*) 13.0 - 17.0 g/dL   HCT 35.4 (*) 39.0 - 52.0 %    MCV 86.1  78.0 - 100.0 fL   MCH 27.3  26.0 - 34.0 pg   MCHC 31.6  30.0 - 36.0 g/dL   RDW 15.5  11.5 - 15.5 %   Platelets 164  150 - 400 K/uL   Neutrophils Relative % 74  43 - 77 %   Neutro Abs 9.3 (*) 1.7 - 7.7 K/uL   Lymphocytes Relative 13  12 - 46 %   Lymphs Abs 1.6  0.7 - 4.0 K/uL   Monocytes Relative 13 (*) 3 - 12 %   Monocytes Absolute 1.6 (*) 0.1 - 1.0 K/uL   Eosinophils Relative 0  0 - 5 %   Eosinophils Absolute 0.1  0.0 - 0.7 K/uL   Basophils Relative 0  0 - 1 %   Basophils Absolute 0.0  0.0 - 0.1 K/uL  COMPREHENSIVE METABOLIC PANEL     Status: Abnormal   Collection Time    12/13/13  4:46 AM      Result Value Ref Range   Sodium 143  137 - 147 mEq/L   Potassium 4.3  3.7 - 5.3 mEq/L   Chloride 101  96 - 112 mEq/L   CO2 22  19 - 32 mEq/L   Glucose, Bld 92  70 - 99 mg/dL   BUN 19  6 - 23 mg/dL   Creatinine, Ser 1.11  0.50 - 1.35 mg/dL   Calcium 10.6 (*) 8.4 - 10.5 mg/dL   Total Protein 8.5 (*) 6.0 - 8.3 g/dL   Albumin 3.9  3.5 - 5.2 g/dL   AST 48 (*) 0 - 37 U/L  ALT 33  0 - 53 U/L   Alkaline Phosphatase 243 (*) 39 - 117 U/L   Total Bilirubin 0.8  0.3 - 1.2 mg/dL   GFR calc non Af Amer 79 (*) >90 mL/min   GFR calc Af Amer >90  >90 mL/min   Comment: (NOTE)     The eGFR has been calculated using the CKD EPI equation.     This calculation has not been validated in all clinical situations.     eGFR's persistently <90 mL/min signify possible Chronic Kidney     Disease.   Anion gap 20 (*) 5 - 15  URINE MICROSCOPIC-ADD ON     Status: None   Collection Time    12/13/13  4:46 AM      Result Value Ref Range   Squamous Epithelial / LPF RARE  RARE   WBC, UA 0-2  <3 WBC/hpf   RBC / HPF 0-2  <3 RBC/hpf  PROCALCITONIN     Status: None   Collection Time    12/13/13  4:46 AM      Result Value Ref Range   Procalcitonin 0.38     Comment:            Interpretation:     PCT (Procalcitonin) <= 0.5 ng/mL:     Systemic infection (sepsis) is not likely.     Local bacterial  infection is possible.     (NOTE)             ICU PCT Algorithm               Non ICU PCT Algorithm        ----------------------------     ------------------------------             PCT < 0.25 ng/mL                 PCT < 0.1 ng/mL         Stopping of antibiotics            Stopping of antibiotics           strongly encouraged.               strongly encouraged.        ----------------------------     ------------------------------           PCT level decrease by               PCT < 0.25 ng/mL           >= 80% from peak PCT           OR PCT 0.25 - 0.5 ng/mL          Stopping of antibiotics                                                 encouraged.         Stopping of antibiotics               encouraged.        ----------------------------     ------------------------------           PCT level decrease by              PCT >= 0.25 ng/mL           < 80% from  peak PCT            AND PCT >= 0.5 ng/mL            Continuing antibiotics                                                  encouraged.           Continuing antibiotics                encouraged.        ----------------------------     ------------------------------         PCT level increase compared          PCT > 0.5 ng/mL             with peak PCT AND              PCT >= 0.5 ng/mL             Escalation of antibiotics                                              strongly encouraged.          Escalation of antibiotics            strongly encouraged.  I-STAT CG4 LACTIC ACID, ED     Status: Abnormal   Collection Time    12/13/13  4:56 AM      Result Value Ref Range   Lactic Acid, Venous 2.38 (*) 0.5 - 2.2 mmol/L  BLOOD GAS, ARTERIAL     Status: Abnormal   Collection Time    12/13/13  9:12 AM      Result Value Ref Range   O2 Content ROOM AIR     pH, Arterial 7.418  7.350 - 7.450   pCO2 arterial 31.5 (*) 35.0 - 45.0 mmHg   pO2, Arterial 70.5 (*) 80.0 - 100.0 mmHg   Bicarbonate 20.0  20.0 - 24.0 mEq/L   TCO2 18.2  0 - 100  mmol/L   Acid-base deficit 3.3 (*) 0.0 - 2.0 mmol/L   O2 Saturation 92.6     Patient temperature 98.6     Collection site BRACHIAL ARTERY     Drawn by [id number]     Sample type ARTERIAL DRAW     Allens test (pass/fail) PASS  PASS  MRSA PCR SCREENING     Status: Abnormal   Collection Time    12/13/13  9:18 AM      Result Value Ref Range   MRSA by PCR POSITIVE (*) NEGATIVE   Comment:            The GeneXpert MRSA Assay (FDA     approved for NASAL specimens     only), is one component of a     comprehensive MRSA colonization     surveillance program. It is not     intended to diagnose MRSA     infection nor to guide or     monitor treatment for     MRSA infections.     RESULT CALLED TO, READ BACK BY AND VERIFIED WITH:     [place] RN AT [place] ON 08.20.15 BY SHUEA  Ct Abdomen Pelvis W Contrast  12/12/2013   CLINICAL DATA:  Mid abdominal pain.  Agitation.  Poor historian.  EXAM: CT ABDOMEN AND PELVIS WITH CONTRAST  TECHNIQUE: Multidetector CT imaging of the abdomen and pelvis was performed using the standard protocol following bolus administration of intravenous contrast.  CONTRAST:  30m OMNIPAQUE IOHEXOL 300 MG/ML SOLN, 833mOMNIPAQUE IOHEXOL 300 MG/ML SOLN  COMPARISON:  Retroperitoneal ultrasound December 10, 2013 trauma CT of the chest December 10, 2013  FINDINGS: LUNG BASES: Ground-glass opacities in the lingula, atelectasis in the lung bases. Visualized heart and pericardium are unremarkable. Please see CT of chest from December 10, 2013  SOLID ORGANS: The liver, spleen, pancreas and adrenal glands are unremarkable. Nonvisualized gallbladder likely representing cholecystectomy.  GASTROINTESTINAL TRACT: Gastrostomy tube in place, intraluminal retaining fall. The stomach, small and large bowel are normal in course and caliber without inflammatory changes. Moderate amount of retained large bowel stool. The appendix is not discretely identified, however there are no inflammatory changes in the right  lower quadrant.  KIDNEYS/ URINARY TRACT: Kidneys are orthotopic, demonstrating symmetric enhancement. Punctate right lower pole, 2 mm left interpolar, 3 mm left lower pole nephrolithiasis. No hydronephrosis or solid renal masses. The unopacified ureters are normal in course and caliber. Delayed imaging through the kidneys demonstrates symmetric prompt contrast excretion within the proximal urinary collecting system. Urinary bladder is well distended, dense with layering contrast from CT chest 1 day prior.  PERITONEUM/RETROPERITONEUM: No intraperitoneal free fluid nor free air. Aortoiliac vessels are normal in course and caliber, mild calcific atherosclerosis. No lymphadenopathy by CT size criteria. Internal reproductive organs are unremarkable.  SOFT TISSUE/OSSEOUS STRUCTURES: Nonsuspicious.  IMPRESSION: Moderate amount of retained large bowel stool. Gastrostomy tube in place. No acute intra-abdominal or pelvic process.  Bilateral nonobstructing nephrolithiasis measuring up to 3 mm.   Electronically Signed   By: CoElon Alas On: 12/12/2013 00:24   Dg Chest Portable 1 View  12/13/2013   CLINICAL DATA:  Altered mental status, history of seizures and agitation.  EXAM: PORTABLE CHEST - 1 VIEW  COMPARISON:  CT of the chest December 10, 2013  FINDINGS: Diffuse interstitial prominence, confluent in the right upper and midlung zones. No pleural effusions. No pneumothorax. Cardiomediastinal silhouette is unremarkable. No pneumothorax. Soft tissue planes are nonsuspicious, moderate mid thoracic compression fracture, present on prior imaging.  IMPRESSION: Diffuse interstitial prominence, somewhat confluent within the right upper lobe and right midlung zone, this could be infectious/pneumonia or inflammatory.   Electronically Signed   By: CoElon Alas On: 12/13/2013 05:04    Assessment/Plan: [age]ear old man with history of traumatic brain injury as well as history seizure disorder, with altered mental status  with findings indicative of acute delirium, most likely associated with medications or possibly medication withdrawal. Patient has not been on respiratory since arriving at the hospital. He also has not been Remeron. Sleep deprivation may be a contributing factor. As well, acute febrile illness may be contributing to his altered mental status. CT scan of the head is pending. Acute structural brain lesion is unlikely but cannot be ruled out at this point.  Recommendations: 1. Continue Keppra 500 mg every 12 hours IV, given patient's history of seizure disorder. 2. Urine rapid drug screen. 3. Resume Risperdal if okayed by psychiatry. 4. EEG when patient is more cooperative and less agitated.  We will continue to follow this patient with you.  C.R. StNicole KindredMD [facility] [phone]/20/2015, 11:42 AM

## 2013-12-13 NOTE — ED Notes (Signed)
Unable to obtain new blood pressure, Pt is shaking very badly.

## 2013-12-13 NOTE — Progress Notes (Signed)
Reassessed at bedside. Patient continues to have agitated delirium.  Punching the air, swinging at things that are not there, jerking movements of all extremities with periods of intermittent control.    Plan: Add valproic acid as adjunct for delirium (not seizures) Continue precedex, ok to push upper limit to 1mg /kg/hr Continue keppra Monitor respiratory status closely    Canary BrimBrandi Esta Carmon, NP-C [facility] Pgr: [phone] or [phone]

## 2013-12-13 NOTE — Progress Notes (Addendum)
Pt seen and examined at bedside. Has history of TBI and admitted for AMS, agitation. He has apparently been in ED for 3 days awaiting for placement. Last night became febrile and hypotensive, with BP 80/60 and HR in 120 - 130's, noted to have infiltrate on CXR, started on ABX Vancomycin and Zosyn for sepsis secondary to presumptive HCAP. Currently on restraints, appears to be in grand mal, contracted, unresponsive. Sitter at bedside reports pt has been in this state since last night. No family at bedside. Pt is still with BP in 80's/60's, HR 125, RR in 30's. Called neurology on call and PCCM, transfer to ICU and give one dose of Ativan 2 mg IV stat, load with Keppra 1 gm IV stat.   Debbora PrestoMAGICK-Sofiya Ezelle, MD  [facility] Pager [phone]  If 7PM-7AM, please contact night-coverage [email or web address] Password [email or web address]

## 2013-12-13 NOTE — ED Notes (Signed)
Attempted to call report, but receiving nurse in an isolation room.

## 2013-12-14 ENCOUNTER — Inpatient Hospital Stay (HOSPITAL_COMMUNITY): Payer: Medicaid Other

## 2013-12-14 LAB — COMPREHENSIVE METABOLIC PANEL
ALBUMIN: 2.9 g/dL — AB (ref 3.5–5.2)
ALT: 28 U/L (ref 0–53)
ANION GAP: 15 (ref 5–15)
AST: 43 U/L — ABNORMAL HIGH (ref 0–37)
Alkaline Phosphatase: 199 U/L — ABNORMAL HIGH (ref 39–117)
BILIRUBIN TOTAL: 0.6 mg/dL (ref 0.3–1.2)
BUN: 19 mg/dL (ref 6–23)
CHLORIDE: 113 meq/L — AB (ref 96–112)
CO2: 19 mEq/L (ref 19–32)
Calcium: 9.5 mg/dL (ref 8.4–10.5)
Creatinine, Ser: 0.83 mg/dL (ref 0.50–1.35)
GFR calc Af Amer: >90 mL/min (ref >90)
GFR calc non Af Amer: >90 mL/min (ref >90)
Glucose, Bld: 102 mg/dL — ABNORMAL HIGH (ref 70–99)
Potassium: 4.1 mEq/L (ref 3.7–5.3)
Sodium: 147 mEq/L (ref 137–147)
TOTAL PROTEIN: 7.1 g/dL (ref 6.0–8.3)

## 2013-12-14 LAB — URINE CULTURE
COLONY COUNT: NO GROWTH
Culture: NO GROWTH
Special Requests: NORMAL

## 2013-12-14 LAB — LEGIONELLA ANTIGEN, URINE: Legionella Antigen, Urine: NEGATIVE

## 2013-12-14 LAB — MAGNESIUM: Magnesium: 2 mg/dL (ref 1.5–2.5)

## 2013-12-14 LAB — CBC
HCT: 34.4 % — ABNORMAL LOW (ref 39.0–52.0)
Hemoglobin: 11 g/dL — ABNORMAL LOW (ref 13.0–17.0)
MCH: 27.4 pg (ref 26.0–34.0)
MCHC: 32 g/dL (ref 30.0–36.0)
MCV: 85.8 fL (ref 78.0–100.0)
Platelets: 133 10*3/uL — ABNORMAL LOW (ref 150–400)
RBC: 4.01 MIL/uL — ABNORMAL LOW (ref 4.22–5.81)
RDW: 15.2 % (ref 11.5–15.5)
WBC: 8.3 10*3/uL (ref 4.0–10.5)

## 2013-12-14 LAB — PHOSPHORUS: PHOSPHORUS: 3.3 mg/dL (ref 2.3–4.6)

## 2013-12-14 LAB — TSH: TSH: 1.78 u[IU]/mL (ref 0.350–4.500)

## 2013-12-14 MED ORDER — ONDANSETRON HCL 4 MG/2ML IJ SOLN: 4.0000 mg | Freq: Four times a day (QID) | INTRAMUSCULAR | Status: DC | PRN

## 2013-12-14 MED ORDER — DOCUSATE SODIUM 50 MG/5ML PO LIQD
100.0000 mg | Freq: Two times a day (BID) | ORAL | Status: DC
  Administered 2013-12-14 – 2014-01-03 (×29): 100 mg
  Filled 2013-12-14 (×43): qty 10

## 2013-12-14 MED ORDER — HALOPERIDOL LACTATE 5 MG/ML IJ SOLN
1.0000 mg | Freq: Once | INTRAMUSCULAR | Status: AC
  Administered 2013-12-14: 1 mg via INTRAVENOUS
  Filled 2013-12-14: qty 1

## 2013-12-14 MED ORDER — POLYETHYLENE GLYCOL 3350 17 G PO PACK
17.0000 g | PACK | Freq: Every day | ORAL | Status: DC | PRN
  Filled 2013-12-14 (×2): qty 1

## 2013-12-14 MED ORDER — CHLORHEXIDINE GLUCONATE 0.12 % MT SOLN
15.0000 mL | Freq: Two times a day (BID) | OROMUCOSAL | Status: DC
  Administered 2013-12-14 – 2013-12-20 (×13): 15 mL via OROMUCOSAL
  Filled 2013-12-14 (×13): qty 15

## 2013-12-14 MED ORDER — CHLORHEXIDINE GLUCONATE CLOTH 2 % EX PADS
6.0000 | MEDICATED_PAD | Freq: Every day | CUTANEOUS | Status: AC
  Administered 2013-12-15 – 2013-12-19 (×5): 6 via TOPICAL

## 2013-12-14 MED ORDER — DEXTROSE-NACL 5-0.45 % IV SOLN
INTRAVENOUS | Status: DC
  Administered 2013-12-14 – 2013-12-15 (×2): via INTRAVENOUS

## 2013-12-14 MED ORDER — JEVITY 1.5 CAL/FIBER PO LIQD
1000.0000 mL | ORAL | Status: DC
  Administered 2013-12-14: 1000 mL
  Filled 2013-12-14 (×2): qty 1000

## 2013-12-14 MED ORDER — DEXMEDETOMIDINE HCL IN NACL 400 MCG/100ML IV SOLN
0.2000 ug/kg/h | INTRAVENOUS | Status: AC
  Administered 2013-12-14: 1 ug/kg/h via INTRAVENOUS
  Administered 2013-12-14: 0.1 ug/kg/h via INTRAVENOUS
  Administered 2013-12-14: 1 ug/kg/h via INTRAVENOUS
  Administered 2013-12-14: 0.7 ug/kg/h via INTRAVENOUS
  Administered 2013-12-14 – 2013-12-15 (×5): 1 ug/kg/h via INTRAVENOUS
  Filled 2013-12-14: qty 100
  Filled 2013-12-14 (×7): qty 50

## 2013-12-14 MED ORDER — ONDANSETRON HCL 4 MG PO TABS: 4.0000 mg | ORAL_TABLET | Freq: Four times a day (QID) | ORAL | Status: DC | PRN

## 2013-12-14 MED ORDER — HYDROCODONE-ACETAMINOPHEN 5-325 MG PO TABS: 1.0000 | ORAL_TABLET | ORAL | Status: DC | PRN

## 2013-12-14 MED ORDER — CETYLPYRIDINIUM CHLORIDE 0.05 % MT LIQD
7.0000 mL | Freq: Two times a day (BID) | OROMUCOSAL | Status: DC
  Administered 2013-12-14 – 2013-12-20 (×13): 7 mL via OROMUCOSAL

## 2013-12-14 MED ORDER — SENNOSIDES 8.8 MG/5ML PO SYRP
5.0000 mL | ORAL_SOLUTION | Freq: Two times a day (BID) | ORAL | Status: DC
  Administered 2013-12-14 – 2014-01-03 (×31): 5 mL
  Filled 2013-12-14 (×42): qty 5

## 2013-12-14 MED ORDER — MUPIROCIN 2 % EX OINT
1.0000 "application " | TOPICAL_OINTMENT | Freq: Two times a day (BID) | CUTANEOUS | Status: AC
  Administered 2013-12-14 – 2013-12-18 (×10): 1 via NASAL
  Filled 2013-12-14 (×3): qty 22

## 2013-12-14 MED ORDER — PANTOPRAZOLE SODIUM 40 MG PO PACK
40.0000 mg | PACK | ORAL | Status: DC
  Administered 2013-12-14 – 2013-12-22 (×9): 40 mg
  Filled 2013-12-14 (×15): qty 20

## 2013-12-14 MED ORDER — ACETAMINOPHEN 160 MG/5ML PO SOLN: 650.0000 mg | Freq: Four times a day (QID) | ORAL | Status: DC | PRN

## 2013-12-14 NOTE — Progress Notes (Signed)
PT Cancellation Note  Patient Details Name: Hector [name] MRN: [id number] DOB: 06-03-1970   Cancelled Treatment:    Reason Eval/Treat Not Completed: Patient not medically ready--spoke with RN who recommends PT be held. Will continue to check back.    [name] AlertJannie Hayden Mabin, MPT Pager: [phone]

## 2013-12-14 NOTE — Progress Notes (Signed)
CSW received referral for New SNF.  Pt was seen in ED by ED CSW and ED CSW made aware that pt parents wish to seek SNF placement as they do not feel that they can continue to care for pt at home. ED CSW initiated SNF search, but no bed offers offered at that time given pt combativeness and agitated delirium.  This unit CSW reviewed chart and updated pt FL2. CSW noted that pt currently in 4-point restraints and EEG pending.  Pt not appropriate for re-initiation of SNF search at this time and CSW will continue to follow and re-initiate SNF search when appropriate.   CSW discussed with RN.  CSW to continue to follow to assist with pt disposition planning and complete full psychosocial assessment when appropriate.  Loletta SpecterSuzanna Kidd, MSW, LCSW Clinical Social Work [phone]

## 2013-12-14 NOTE — Progress Notes (Signed)
INITIAL NUTRITION ASSESSMENT  DOCUMENTATION CODES Per approved criteria  -Not Applicable   INTERVENTION: - Diet advancement per MD - Recommend, if pt remains NPO, advancing TF via PEG of Jevity 1.5 at 2710ml/hr by 10ml every 4 hours to goal of 150ml/hr. Goal rate will provide 1800 calories, 76g protein, and 912ml free water and meet 100% of estimated nutritional needs - Recommend initiation of adult enteral protocol - RD to continue to monitor   NUTRITION DIAGNOSIS: Inadequate oral intake related to inability to eat as evidenced by NPO.    Goal: 1. Advance diet as tolerated to regular diet 2. If pt remains NPO, for TF advancement with tolerance and goal to meet >90% of estimated nutritional needs  Monitor:  Weights, labs, diet advancement, TF tolerance/advancement  Reason for Assessment: Consult for assessment   43 y.o. male  Admitting Dx: Agitation  ASSESSMENT: Pt with history of traumatic brain injury after a seizure episode that occurred in April this year. Patient was comatose for prolonged period of time and then regained consciousness he was no longer verbal. Patient was discharged to home but family states have been aggressive towards them. Patient now requires 24-hour care. Family states that 3 days ago patient started to become agitated grabbing at the right flank/side. Pt in restraints. Found to have acute agitated delirium.   - Per telephone conversation with father, pt was on a regular/thin liquid diet at home and usually eats 50% of his meals and drinks 2-3 Ensure/day in addition to lots of caffeine free mountain dew - Sample meals include mac and cheese with protein and vegetables - Said that in the past 3-4 days pt has only been taking 1-2 bites at mealtimes - Father said lowest weight was 105 pounds and earlier this week it was 110 pounds - Said the only thing put through pt's PEG at home is 6.5 oz of water three times/day - Reported pt started coughing a lot with  meals on Tuesday, usually has no problems  - Denies that pt has any problems chewing - Appears thin with mild muscle wasting in clavicles - Per NP, plan is to start trickle TF today  TF: Jevity 1.5 at 6310ml/hr via PEG - 360 calories, 15g protein meeting 22% estimated calorie needs, 23% estimated protein needs   Height: Ht Readings from Last 1 Encounters:  12/13/13 5\' 7"  (1.702 m)    Weight: Wt Readings from Last 1 Encounters:  12/13/13 120 lb (54.432 kg)    Ideal Body Weight: 148 lbs  % Ideal Body Weight: 81%  Wt Readings from Last 10 Encounters:  12/13/13 120 lb (54.432 kg)    Usual Body Weight: 110 lbs per father  % Usual Body Weight: 109%  BMI:  Body mass index is 18.79 kg/(m^2).  Estimated Nutritional Needs: Kcal: 1650-1850 Protein: 65-80g Fluid: 1.6-1.8L/day   Skin: Right foot abrasion, right arm scab  Diet Order: NPO  EDUCATION NEEDS: -No education needs identified at this time   Intake/Output Summary (Last 24 hours) at 12/14/13 1549 Last data filed at 12/14/13 1500  Gross per 24 hour  Intake 1935.61 ml  Output    975 ml  Net 960.61 ml    Last BM: PTA  Labs:   Recent Labs Lab 12/11/13 2217 12/13/13 0446 12/14/13 0340  NA 140 143 147  K 4.6 4.3 4.1  CL 100 101 113*  CO2 26 22 19   BUN 21 19 19   CREATININE 1.11 1.11 0.83  CALCIUM 10.4 10.6* 9.5  MG  --   --  2.0  PHOS  --   --  3.3  GLUCOSE 127* 92 102*    CBG (last 3)  No results found for this basename: GLUCAP,  in the last 72 hours  Scheduled Meds: . antiseptic oral rinse  7 mL Mouth Rinse q12n4p  . chlorhexidine  15 mL Mouth Rinse BID  . [START ON 12/15/2013] Chlorhexidine Gluconate Cloth  6 each Topical Q0600  . docusate  100 mg Per Tube BID  . feeding supplement (JEVITY 1.5 CAL/FIBER)  1,000 mL Per Tube Q24H  . mupirocin ointment  1 application Nasal BID  . pantoprazole sodium  40 mg Per Tube Q24H  . sennosides  5 mL Per Tube BID  . sodium chloride  3 mL Intravenous Q12H   . valproate sodium  500 mg Intravenous Q12H    Continuous Infusions: . dexmedetomidine 1 mcg/kg/hr (12/14/13 1500)  . dextrose 5 % and 0.45% NaCl 50 mL/hr at 12/14/13 1500    Past Medical History  Diagnosis Date  . 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 Seizure   . DVT (deep venous thrombosis)     UE, LE DVT during hospitalization  . H/O renal calculi     History reviewed. No pertinent past surgical history.  Charlott Rakes MS, RD, LDN [phone] Pager [phone] Weekend/After Hours Pager

## 2013-12-14 NOTE — ED Provider Notes (Signed)
Medical screening examination/treatment/procedure(s) were conducted as a shared visit with non-physician practitioner(s) and myself.  I personally evaluated the patient during the encounter.   EKG Interpretation None      Patient here with R flank pain - elevated D-dimer at the PCP's office - PE study negative. Has focal R ribcage pain for me, likely musculoskeletal. Stable for discharge.  Elwin MochaBlair Kurstyn Larios, MD 12/14/13 [phone]

## 2013-12-14 NOTE — Consult Note (Signed)
PULMONARY / CRITICAL CARE MEDICINE   Name: Hector [name] MRN: [id number] DOB: 02-Feb-1971    ADMISSION DATE:  12/11/2013 CONSULTATION DATE:  12/13/13  REFERRING MD :  Dr. Izola PriceMyers / [facility]   CHIEF COMPLAINT:  Seizure, concern for airway protection   INITIAL PRESENTATION: 43 y/o M with PMH of TBI who [facility] with agitation and R flank pain.  Found to have urinary retention and sent home.  CTA negative for PE.  Returned 8/18 with similar complaints. Parents have reported aggressive behavior and that they are unable to care for him.  IVC papers taken out on patient. He waited in ER for placement until 8/20 am when he was found to have jerking movements concerning for seizures.  PCCM consulted for evaluation.    STUDIES:  8/17  Renal US >> normal renal US 8/17  CTA Chest >> no acute disease, mild centrilobular emphysema 8/19  CT ABD/Pelvis >> moderate amt stool in large bowel, G-tube, no acute process, bilateral non-obs up to 3mm stones 8/21  CT Head >>   SIGNIFICANT EVENTS: 8/17  Seen in ER with agitation & urinary retention, R flank pain.  See studies above.  8/18  Returned to ER with agitation, IVC papers taken out for placement per parents.  Remained in ER for placement 8/20  Admitted to SDU after shaking / agitation, concerns for seizures & ? R airspace disease 8/21  Improved agitation, brady with precedex    SUBJECTIVE: No acute events overnight.  Improved agitation.  BP stable, brady with precedex  VITAL SIGNS: Temp:  [97.7 F (36.5 C)-100.2 F (37.9 C)] 98.2 F (36.8 C) (08/21 0800) Pulse Rate:  [53-128] 53 (08/21 0600) Resp:  [20-59] 27 (08/21 0700) BP: (81-215)/(49-139) 107/62 mmHg (08/21 0700) SpO2:  [91 %-100 %] 100 % (08/21 0700)  HEMODYNAMICS:    VENTILATOR SETTINGS:    INTAKE / OUTPUT:  Intake/Output Summary (Last 24 hours) at 12/14/13 0952 Last data filed at 12/14/13 0741  Gross per 24 hour  Intake 1841.09 ml  Output   1175 ml  Net 666.09  ml    PHYSICAL EXAMINATION: General:  Thin adult male lying in bed, no acute distress  Neuro:  Eyes closed, mumbles incoherently, MAE  HEENT:  Mm dry, no jvd  Cardiovascular:  s1s2 rrr, tachy  Lungs:  resp's even/non-labored, lungs bilaterally clear  Abdomen:  Flat, soft, bsx4 active  Musculoskeletal:  No acute deformities  Skin:  Warm/dry, scattered bruises, areas of erythema on limbs  LABS:  CBC  Recent Labs Lab 12/11/13 2217 12/13/13 0446 12/14/13 0340  WBC 7.6 12.5* 8.3  HGB 10.9* 11.2* 11.0*  HCT 33.5* 35.4* 34.4*  PLT 144* 164 133*   Coag's No results found for this basename: APTT, INR,  in the last 168 hours BMET  Recent Labs Lab 12/11/13 2217 12/13/13 0446 12/14/13 0340  NA 140 143 147  K 4.6 4.3 4.1  CL 100 101 113*  CO2 26 22 19   BUN 21 19 19   CREATININE 1.11 1.11 0.83  GLUCOSE 127* 92 102*   Electrolytes  Recent Labs Lab 12/11/13 2217 12/13/13 0446 12/14/13 0340  CALCIUM 10.4 10.6* 9.5  MG  --   --  2.0  PHOS  --   --  3.3   Sepsis Markers  Recent Labs Lab 12/13/13 0446 12/13/13 0456  LATICACIDVEN  --  2.38*  PROCALCITON 0.38  --    ABG  Recent Labs Lab 12/13/13 0912  PHART  7.418  PCO2ART 31.5*  PO2ART 70.5*   Liver Enzymes  Recent Labs Lab 12/11/13 2217 12/13/13 0446 12/14/13 0340  AST 41* 48* 43*  ALT 35 33 28  ALKPHOS 236* 243* 199*  BILITOT 0.4 0.8 0.6  ALBUMIN 3.7 3.9 2.9*   Cardiac Enzymes  Recent Labs Lab 12/13/13 1114  TROPONINI <0.30  PROBNP 729.4*   Glucose No results found for this basename: GLUCAP,  in the last 168 hours  Imaging Dg Chest Portable 1 View  12/13/2013   CLINICAL DATA:  Altered mental status, history of seizures and agitation.  EXAM: PORTABLE CHEST - 1 VIEW  COMPARISON:  CT of the chest December 10, 2013  FINDINGS: Diffuse interstitial prominence, confluent in the right upper and midlung zones. No pleural effusions. No pneumothorax. Cardiomediastinal silhouette is unremarkable. No  pneumothorax. Soft tissue planes are nonsuspicious, moderate mid thoracic compression fracture, present on prior imaging.  IMPRESSION: Diffuse interstitial prominence, somewhat confluent within the right upper lobe and right midlung zone, this could be infectious/pneumonia or inflammatory.   Electronically Signed   By: Awilda Metro   On: 12/13/2013 05:04     ASSESSMENT / PLAN:  NEUROLOGIC A:   Severe Agitated Delirium - concern for medication related delirium, paradoxical effects of sedating medications.   Hx TBI - 07/2013 after fall with seizure s/p craniotomy  Seizure Disorder - do not feel current movements are seizure related Expressive Aphasia  P:   RASS goal: n/a  Hold home regimen:  Amantadine, bromocriptine, remeron, risperdal  Continue Keppra for seizures Valporate BID for delirium Precedex gtt for agitated delirum Neurology Consult, appreciate input Assess CT head, likely can go for scan 8/21 with improved agitation Continue to hold PSY regimen for another 24 hours, then consider reduction of precedex, adding of agents one at a time  PULMONARY OETT n/a A: Tachypnea - ? If this is medication related / recent changes in home regimen.  ABG reassuring.  At Risk Aspiration - in setting of AMS R/O CAP - no overt infiltrate on CT or CXR.  Cough prior to admit without sputum production.  P:   Trend CXR Oxygen to support sats > 92% Monitor respiratory status closely for changes given mental status  CARDIOVASCULAR CVL n/a A:  Tachycardia - neg troponin, mild elevation in BNP Bradycardia - in setting of precedex, BP stable P:  Tele monitoring Change IVF to D51/2NS @ 50 Accept bradycardia with stable BP  RENAL A:   Likely Rhabdo - in setting of agitated delirium  P:   Follow BMP Replace electrolytes as indicated Trend CK  GASTROINTESTINAL A:   PEG Status - pt pulled out in ER, replaced per staff P:   Assess KUB to confirm placement prior to use Meds per  PEG NPO otherwise with oral care  HEMATOLOGIC A:   Mild Anemia - without acute bleeding 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  Afebrile.   MRSA PCR + Fever  - low grade on admit, no further spikes P:   BCx2 8/20 >> UC 8/20 >>neg Hold further antibiotics Monitor fever curve, leukocytosis PRN APAP  ENDOCRINE A:   No acute issues - TSH wnl P:   Assess TSH  TODAY'S SUMMARY:  43 y/o M with PMH of TBI on multiple medications for agitation (with increase / change in meds as of late) admitted for agitated delirium & tachycardia.  Concern for medication related delirium / ? serotonin syndrome.  +/- rhabdo with significant agitation.  CT head pending.  Neurology consulted.    Canary Brim, NP-C [facility] Pgr: [phone] or [phone]  Attending:  I have seen and examined the patient with nurse practitioner/resident and agree with the note above.   I talked to the family for a while yesterday, so its still not clear what his baseline could be.  Apparently the first week after coming home from his initial hospital stay for the TBI he was able to walk and perform ADLs.  So suspect this decline is due to polypharmacy? Is something else going on? At this point no clear evidence of an alternative process.    Continue valproic acid, wean off precedex  I have personally obtained a history, examined the patient, evaluated laboratory and imaging results, formulated the assessment and plan and placed orders.  CRITICAL CARE: The patient is critically ill with multiple organ systems failure and requires high complexity decision making for assessment and support, frequent evaluation and titration of therapies, application  of advanced monitoring technologies and extensive interpretation of multiple databases. Critical Care Time devoted to patient care services described in this note is 35 minutes.    Heber Walnut, MD [place] [facility] Pager: [phone] Cell: [phone] If no response, call [phone]   12/14/2013, 9:52 AM

## 2013-12-14 NOTE — Progress Notes (Signed)
[name], male   DOB: Oct 02, 1970, 43 y.o.   MRN: [id number] [facility] PROGRESS NOTE  Hector [name] WGN:[id number] DOB: May 23, 1970 DOA: 12/11/2013 PCP: Mariea Clonts, NP  Brief narrative: 43 y/o male with PMH of TBI, presented to [facility] ER with AMS, family requesting placement as they are no longer able to care for him at home. He stayed in ED for 3 days awaiting placement but had developed fevers and ? PNA on CXR. [facility] asked to admit to ICU due to severe agitation, PCCM consulted.    SIGNIFICANT EVENTS:  8/17 Seen in ER with agitation & urinary retention, R flank pain. See studies above.  8/18 Returned to ER with agitation, IVC papers taken out for placement per parents. Remained in ER for placement  8/20 Admitted to SDU after shaking / agitation, concerns for seizures & ? R airspace disease  8/21 Improved agitation, brady with precedex    Assessment and Plan:    Severe Agitated Delirium  - concern for medication related delirium, paradoxical effects of sedating medications, ? Seizures  - continue Keppra and valproate  - wean off precedex drip as possible - appreciate PCCM input  Tachypnea  - high risk aspiration  - keep NPO for now - no overt infiltrate on CT or CXR Tachycardia  - with brady episodes - likely from precedex, wean as clinically possible  Rhabdomyolysis - follow CK level - IVF continued  Severe PCM - with PEG tube  Anemia of chronic disease - no signs of active bleeding  Hx DVT  - upper & lower extremity DVT during admission in 07/2013, on xarelto at baseline  - holding Xarelto until CT head completed ruling out bleed   DVT prophylaxis  On Xarelto but now on hold until CT head cleared   Code Status: Full Family Communication: Family over the phone  Disposition Plan: Remains in ICU    IV Access:   Peripheral IV Procedures and diagnostic studies:   Dg Chest Portable 1 View  12/13/2013   Diffuse interstitial prominence,  somewhat confluent within the right upper lobe and right midlung zone, this could be infectious/pneumonia or inflammatory.   Dg Abd Portable 1v  12/14/2013   Tip of gastrostomy tube projects in the stomach.     Medical Consultants:   PCCM Neurology  Other Consultants:   Physical therapy  SW Anti-Infectives:   None  Debbora Presto, MD  [facility] Pager [phone]  If 7PM-7AM, please contact night-coverage [email or web address] Password [facility] 12/14/2013, 1:59 PM   LOS: 3 days   HPI/Subjective: No events overnight.   Objective: Filed Vitals:   12/14/13 1000 12/14/13 1100 12/14/13 1200 12/14/13 1300  BP: 122/58  98/65   Pulse:      Temp:   97.7 F (36.5 C)   TempSrc:   Axillary   Resp: 29 20 33 23  Height:      Weight:      SpO2: 100% 100% 100% 100%    Intake/Output Summary (Last 24 hours) at 12/14/13 1359 Last data filed at 12/14/13 1153  Gross per 24 hour  Intake 1933.61 ml  Output   1175 ml  Net 758.61 ml    Exam:   General:  Pt is still agitated, pulling lines, non communicative   Cardiovascular: Regular rhythm, tachycardic, S1/S2, no murmurs, no rubs, no gallops  Respiratory: Clear to auscultation bilaterally, no wheezing, no crackles, no rhonchi  Abdomen: Soft, non tender, non distended, bowel sounds present, no guarding  Data Reviewed: Basic Metabolic Panel:  Recent Labs Lab 12/10/13 1952 12/11/13 2217 12/13/13 0446 12/14/13 0340  NA 139 140 143 147  K 4.3 4.6 4.3 4.1  CL 102 100 101 113*  CO2 23 26 22 19   GLUCOSE 98 127* 92 102*  BUN 19 21 19 19   CREATININE 0.97 1.11 1.11 0.83  CALCIUM 10.2 10.4 10.6* 9.5  MG  --   --   --  2.0  PHOS  --   --   --  3.3   Liver Function Tests:  Recent Labs Lab 12/10/13 1952 12/11/13 2217 12/13/13 0446 12/14/13 0340  AST 40* 41* 48* 43*  ALT 33 35 33 28  ALKPHOS 222* 236* 243* 199*  BILITOT 0.5 0.4 0.8 0.6  PROT 8.1 8.1 8.5* 7.1  ALBUMIN 3.8 3.7 3.9 2.9*    Recent Labs Lab 12/10/13 1952  12/11/13 2217  LIPASE 13 16    Recent Labs Lab 12/13/13 1215  AMMONIA 44   CBC:  Recent Labs Lab 12/10/13 1952 12/11/13 2217 12/13/13 0446 12/14/13 0340  WBC 8.9 7.6 12.5* 8.3  NEUTROABS 5.8 5.1 9.3*  --   HGB 11.2* 10.9* 11.2* 11.0*  HCT 34.2* 33.5* 35.4* 34.4*  MCV 85.1 85.2 86.1 85.8  PLT 152 144* 164 133*   Cardiac Enzymes:  Recent Labs Lab 12/13/13 1114  CKTOTAL 1022*  TROPONINI <0.30   BNP: No components found with this basename: POCBNP,  CBG: No results found for this basename: GLUCAP,  in the last 168 hours  Recent Results (from the past 240 hour(s))  URINE CULTURE     Status: None   Collection Time    12/10/13  9:49 PM      Result Value Ref Range Status   Specimen Description URINE, CATHETERIZED   Final   Special Requests NONE   Final   Culture  Setup Time     Final   Value: 12/11/2013 02:44     Performed at [facility]   Colony Count     Final   Value: NO GROWTH     Performed at [facility]   Culture     Final   Value: NO GROWTH     Performed at [facility]   Report Status 12/12/2013 FINAL   Final  CULTURE, BLOOD (ROUTINE X 2)     Status: None   Collection Time    12/13/13  5:04 AM      Result Value Ref Range Status   Specimen Description BLOOD RIGHT HAND   Final   Special Requests BOTTLES DRAWN AEROBIC AND ANAEROBIC [facility]CC   Final   Culture  Setup Time     Final   Value: 12/13/2013 08:47     Performed at [facility]   Culture     Final   Value:        BLOOD CULTURE RECEIVED NO GROWTH TO DATE CULTURE WILL BE HELD FOR 5 DAYS BEFORE ISSUING A FINAL NEGATIVE REPORT     Performed at [facility]   Report Status PENDING   Incomplete  CULTURE, BLOOD (ROUTINE X 2)     Status: None   Collection Time    12/13/13  5:09 AM      Result Value Ref Range Status   Specimen Description BLOOD LEFT FOREARM   Final   Special Requests BOTTLES DRAWN AEROBIC AND ANAEROBIC 6CC   Final   Culture  Setup Time     Final  Value: 12/13/2013 08:47     Performed at [facility]   Culture     Final   Value:        BLOOD CULTURE RECEIVED NO GROWTH TO DATE CULTURE WILL BE HELD FOR 5 DAYS BEFORE ISSUING A FINAL NEGATIVE REPORT     Performed at [facility]   Report Status PENDING   Incomplete  URINE CULTURE     Status: None   Collection Time    12/13/13  9:18 AM      Result Value Ref Range Status   Specimen Description URINE, CATHETERIZED   Final   Special Requests Normal   Final   Culture  Setup Time     Final   Value: 12/13/2013 12:33     Performed at [facility] Count     Final   Value: NO GROWTH     Performed at [facility]   Culture     Final   Value: NO GROWTH     Performed at [facility]   Report Status 12/14/2013 FINAL   Final  MRSA PCR SCREENING     Status: Abnormal   Collection Time    12/13/13  9:18 AM      Result Value Ref Range Status   MRSA by PCR POSITIVE (*) NEGATIVE Final   Comment:            The GeneXpert MRSA Assay (FDA     approved for NASAL specimens     only), is one component of a     comprehensive MRSA colonization     surveillance program. It is not     intended to diagnose MRSA     infection nor to guide or     monitor treatment for     MRSA infections.     RESULT CALLED TO, READ BACK BY AND VERIFIED WITH:     K. STRUP RN AT 1055 ON 08.20.15 BY SHUEA     Scheduled Meds: . [START ON 12/15/2013] Chlorhexidine Gluconate Cloth  6 each Topical Q0600  . docusate  100 mg Per Tube BID  . feeding supplement (JEVITY 1.5 CAL/FIBER)  1,000 mL Per Tube Q24H  . mupirocin ointment  1 application Nasal BID  . pantoprazole sodium  40 mg Per Tube Q24H  . sennosides  5 mL Per Tube BID  . sodium chloride  3 mL Intravenous Q12H  . valproate sodium  500 mg Intravenous Q12H   Continuous Infusions: . dexmedetomidine 1 mcg/kg/hr (12/14/13 1350)  . dextrose 5 % and 0.45% NaCl 50 mL/hr at 12/14/13 1150

## 2013-12-14 NOTE — ED Provider Notes (Signed)
Medical screening examination/treatment/procedure(s) were performed by non-physician practitioner and as supervising physician I was immediately available for consultation/collaboration.   EKG Interpretation None        Rolan BuccoMelanie Marico Buckle, MD 12/14/13 1640

## 2013-12-14 NOTE — Progress Notes (Signed)
Subjective: patient in 4 point restraint, resting comfortably, moving arms and legs purposefully.  When woken up he opens his eyes and talks nonsensical speech.   On Precedex  On ly received the load of Keppra yesterday. Now on Depakote 500 mg BID  Objective: Current vital signs: BP 107/62  Pulse 53  Temp(Src) 98.2 F (36.8 C) (Axillary)  Resp 27  Ht 5\' 7"  (1.702 m)  Wt 54.432 kg (120 lb)  BMI 18.79 kg/m2  SpO2 100% Vital signs in last 24 hours: Temp:  [97.7 F (36.5 C)-100.2 F (37.9 C)] 98.2 F (36.8 C) (08/21 0800) Pulse Rate:  [53-128] 53 (08/21 0600) Resp:  [20-59] 27 (08/21 0700) BP: (81-215)/(49-139) 107/62 mmHg (08/21 0700) SpO2:  [91 %-100 %] 100 % (08/21 0700)  Intake/Output from previous day: 08/20 0701 - 08/21 0700 In: 1818.2 [I.V.:1713.2; IV Piggyback:105] Out: 1175 [Urine:1175] Intake/Output this shift: Total I/O In: 22.9 [I.V.:22.9] Out: -  Nutritional status: NPO  Neurologic Exam: Patient is lethargic will open eyes to voice but then speak in nonsensical speech.  Is not  Agitated but remains in 4 point restraints. He has very infrequent verbal output which was unintelligible.  Pupils were equal and reacted normally to light.  Extraocular movements were full on right and left lateral gaze, and were conjugate.  No facial weakness was noted.  Strength of extremities was normal throughout.  Deep tendon reflexes were 2+ and symmetrical.  Plantar responses were flexor bilaterally.   Lab Results: Basic Metabolic Panel:  Recent Labs Lab 12/10/13 1952 12/11/13 2217 12/13/13 0446 12/14/13 0340  NA 139 140 143 147  K 4.3 4.6 4.3 4.1  CL 102 100 101 113*  CO2 23 26 22 19   GLUCOSE 98 127* 92 102*  BUN 19 21 19 19   CREATININE 0.97 1.11 1.11 0.83  CALCIUM 10.2 10.4 10.6* 9.5  MG  --   --   --  2.0  PHOS  --   --   --  3.3    Liver Function Tests:  Recent Labs Lab 12/10/13 1952 12/11/13 2217 12/13/13 0446 12/14/13 0340  AST 40* 41* 48* 43*   ALT 33 35 33 28  ALKPHOS 222* 236* 243* 199*  BILITOT 0.5 0.4 0.8 0.6  PROT 8.1 8.1 8.5* 7.1  ALBUMIN 3.8 3.7 3.9 2.9*    Recent Labs Lab 12/10/13 1952 12/11/13 2217  LIPASE 13 16    Recent Labs Lab 12/13/13 1215  AMMONIA 44    CBC:  Recent Labs Lab 12/10/13 1952 12/11/13 2217 12/13/13 0446 12/14/13 0340  WBC 8.9 7.6 12.5* 8.3  NEUTROABS 5.8 5.1 9.3*  --   HGB 11.2* 10.9* 11.2* 11.0*  HCT 34.2* 33.5* 35.4* 34.4*  MCV 85.1 85.2 86.1 85.8  PLT 152 144* 164 133*    Cardiac Enzymes:  Recent Labs Lab 12/13/13 1114  CKTOTAL 1022*  TROPONINI <0.30    Lipid Panel: No results found for this basename: CHOL, TRIG, HDL, CHOLHDL, VLDL, LDLCALC,  in the last 168 hours  CBG: No results found for this basename: GLUCAP,  in the last 168 hours  Microbiology: Results for orders placed during the hospital encounter of 12/11/13  CULTURE, BLOOD (ROUTINE X 2)     Status: None   Collection Time    12/13/13  5:04 AM      Result Value Ref Range Status   Specimen Description BLOOD RIGHT HAND   Final   Special Requests BOTTLES DRAWN AEROBIC AND ANAEROBIC 6CC   Final  Culture  Setup Time     Final   Value: 12/13/2013 08:47     Performed at [facility]   Culture     Final   Value:        BLOOD CULTURE RECEIVED NO GROWTH TO DATE CULTURE WILL BE HELD FOR 5 DAYS BEFORE ISSUING A FINAL NEGATIVE REPORT     Performed at [facility]   Report Status PENDING   Incomplete  CULTURE, BLOOD (ROUTINE X 2)     Status: None   Collection Time    12/13/13  5:09 AM      Result Value Ref Range Status   Specimen Description BLOOD LEFT FOREARM   Final   Special Requests BOTTLES DRAWN AEROBIC AND ANAEROBIC [facility]CC   Final   Culture  Setup Time     Final   Value: 12/13/2013 08:47     Performed at [facility]   Culture     Final   Value:        BLOOD CULTURE RECEIVED NO GROWTH TO DATE CULTURE WILL BE HELD FOR 5 DAYS BEFORE ISSUING A FINAL NEGATIVE REPORT     Performed  at [facility]   Report Status PENDING   Incomplete  MRSA PCR SCREENING     Status: Abnormal   Collection Time    12/13/13  9:18 AM      Result Value Ref Range Status   MRSA by PCR POSITIVE (*) NEGATIVE Final   Comment:            The GeneXpert MRSA Assay (FDA     approved for NASAL specimens     only), is one component of a     comprehensive MRSA colonization     surveillance program. It is not     intended to diagnose MRSA     infection nor to guide or     monitor treatment for     MRSA infections.     RESULT CALLED TO, READ BACK BY AND VERIFIED WITH:     K. STRUP RN AT 1055 ON 08.20.15 BY SHUEA    Coagulation Studies: No results found for this basename: LABPROT, INR,  in the last 72 hours  Imaging: Dg Chest Portable 1 View  12/13/2013   CLINICAL DATA:  Altered mental status, history of seizures and agitation.  EXAM: PORTABLE CHEST - 1 VIEW  COMPARISON:  CT of the chest December 10, 2013  FINDINGS: Diffuse interstitial prominence, confluent in the right upper and midlung zones. No pleural effusions. No pneumothorax. Cardiomediastinal silhouette is unremarkable. No pneumothorax. Soft tissue planes are nonsuspicious, moderate mid thoracic compression fracture, present on prior imaging.  IMPRESSION: Diffuse interstitial prominence, somewhat confluent within the right upper lobe and right midlung zone, this could be infectious/pneumonia or inflammatory.   Electronically Signed   By: Awilda Metroourtnay  Bloomer   On: 12/13/2013 05:04    Medications:  Scheduled: . docusate sodium  100 mg Oral BID  . pantoprazole (PROTONIX) IV  40 mg Intravenous QHS  . rivaroxaban  20 mg Oral Daily  . senna  1 tablet Oral BID  . sodium chloride  3 mL Intravenous Q12H  . valproate sodium  500 mg Intravenous Q12H   Continuous: . sodium chloride 125 mL/hr at 12/14/13 0400  . dexmedetomidine 0.5 mcg/kg/hr (12/14/13 0834)   ZOX:[id number]:acetaminophen, acetaminophen, fentaNYL, HYDROcodone-acetaminophen,  ondansetron (ZOFRAN) IV, ondansetron, polyethylene glycol, sodium phosphate  Assessment/Plan:  43 year old man with history of traumatic brain injury as  well as history seizure disorder, with altered mental status with findings indicative of acute delirium, most likely associated with medications or possibly medication withdrawal.  Patient remains off respirator and has not received Remeron. . Currently sleeping.  Was agitated over night requiring addition of Depakote.   EEG and urine drug screen Pending.  Recommend: At this time patient is on Depakote 500 mg BID thus does not need to be on Keppra. Awaiting EEG.   Resume Risperdal if okayed by psychiatry  Will continue to follow  Felicie Morn PA-C [facility] [phone]  12/14/2013, 9:18 AM

## 2013-12-15 DIAGNOSIS — J189 Pneumonia, unspecified organism: Secondary | ICD-10-CM

## 2013-12-15 LAB — CBC
HCT: 31.7 % — ABNORMAL LOW (ref 39.0–52.0)
Hemoglobin: 10.4 g/dL — ABNORMAL LOW (ref 13.0–17.0)
MCH: 27.7 pg (ref 26.0–34.0)
MCHC: 32.8 g/dL (ref 30.0–36.0)
MCV: 84.5 fL (ref 78.0–100.0)
Platelets: 128 10*3/uL — ABNORMAL LOW (ref 150–400)
RBC: 3.75 MIL/uL — AB (ref 4.22–5.81)
RDW: 15.2 % (ref 11.5–15.5)
WBC: 8.1 10*3/uL (ref 4.0–10.5)

## 2013-12-15 LAB — BASIC METABOLIC PANEL
ANION GAP: 16 — AB (ref 5–15)
BUN: 16 mg/dL (ref 6–23)
CO2: 19 meq/L (ref 19–32)
Calcium: 9.6 mg/dL (ref 8.4–10.5)
Chloride: 115 mEq/L — ABNORMAL HIGH (ref 96–112)
Creatinine, Ser: 0.75 mg/dL (ref 0.50–1.35)
GFR calc Af Amer: >90 mL/min (ref >90)
GFR calc non Af Amer: >90 mL/min (ref >90)
Glucose, Bld: 146 mg/dL — ABNORMAL HIGH (ref 70–99)
POTASSIUM: 3.8 meq/L (ref 3.7–5.3)
SODIUM: 150 meq/L — AB (ref 137–147)

## 2013-12-15 LAB — PROCALCITONIN: Procalcitonin: 0.12 ng/mL

## 2013-12-15 MED ORDER — MORPHINE SULFATE 4 MG/ML IJ SOLN
4.0000 mg | INTRAMUSCULAR | Status: DC | PRN
  Administered 2013-12-15 – 2013-12-16 (×11): 4 mg via INTRAVENOUS
  Filled 2013-12-15 (×12): qty 1

## 2013-12-15 MED ORDER — DEXTROSE 5 % IV SOLN
INTRAVENOUS | Status: DC
  Administered 2013-12-15 – 2013-12-16 (×4): via INTRAVENOUS
  Administered 2013-12-17: 1000 mL via INTRAVENOUS

## 2013-12-15 MED ORDER — DEXMEDETOMIDINE HCL IN NACL 400 MCG/100ML IV SOLN
0.2000 ug/kg/h | INTRAVENOUS | Status: AC
  Administered 2013-12-15 – 2013-12-16 (×4): 1.4 ug/kg/h via INTRAVENOUS
  Filled 2013-12-15 (×5): qty 100

## 2013-12-15 MED ORDER — RISPERIDONE 1 MG/ML PO SOLN
0.5000 mg | Freq: Two times a day (BID) | ORAL | Status: DC
  Administered 2013-12-15 – 2013-12-17 (×6): 0.5 mg via ORAL
  Filled 2013-12-15 (×11): qty 0.5

## 2013-12-15 MED ORDER — HALOPERIDOL LACTATE 5 MG/ML IJ SOLN
5.0000 mg | Freq: Four times a day (QID) | INTRAMUSCULAR | Status: DC | PRN
  Administered 2013-12-15 – 2013-12-19 (×9): 5 mg via INTRAVENOUS
  Filled 2013-12-15 (×10): qty 1

## 2013-12-15 MED ORDER — JEVITY 1.5 CAL/FIBER PO LIQD
1000.0000 mL | ORAL | Status: DC
  Administered 2013-12-15 – 2013-12-17 (×3): 1000 mL
  Filled 2013-12-15 (×2): qty 1000

## 2013-12-15 NOTE — Progress Notes (Signed)
eLink Physician-Brief Progress Note Patient Name: Hector [name] DOB: 05/23/1970 MRLavone Nian: [id number]   Date of Service  12/15/2013  HPI/Events of Note  Nurse call - pt with aggitation and agression  eICU Interventions  Haldol 5mg , increase precedex to 1.4     Intervention Category Intermediate Interventions: OtherCarolan Clines:  Tryone Kille K. 12/15/2013, 3:59 PM

## 2013-12-15 NOTE — Progress Notes (Signed)
[name], male   DOB: [birth year] [name], 43 y.o.   MRN: [id number]  [facility] PROGRESS NOTE  Hector [name] Hector [name], Hector [name] DOA: 12/11/2013 PCP: Mariea Clonts, NP  Brief narrative:  43 y/o male with PMH of TBI, presented to [facility] ER with AMS, family requesting placement as they are no longer able to care for him at home. He stayed in ED for 3 days awaiting placement but had developed fevers and ? PNA on CXR. [facility] asked to admit to ICU due to severe agitation, PCCM consulted.   SIGNIFICANT EVENTS:  8/17 Seen in ER with agitation & urinary retention, R flank pain. See studies above.  8/18 Returned to ER with agitation, IVC papers taken out for placement per parents. Remained in ER for placement  8/20 Admitted to SDU after shaking / agitation, concerns for seizures & ? R airspace disease  8/21 Improved agitation, brady with precedex   Assessment and Plan:   Severe Agitated Delirium  - concern for medication related delirium, paradoxical effects of sedating medications, ? Seizures  - continue Keppra and valproate  - pt still on precedex drip max dose, wean off as possible  - appreciate PCCM input  Tachypnea  - high risk aspiration  - no overt infiltrate on CT or CXR  - improved this AM, pt with stable respiratory function  Hyponatremia - currently on D5 and 1/2 NS, continue for now and repeat BMP in AM Tachycardia  - with brady episodes  - likely from precedex, wean as clinically possible  Rhabdomyolysis  - follow CK level  - IVF continued  Severe PCM  - with PEG tube  Anemia of chronic disease  - slight drop in Hg over the past 24 hours  - no signs of active bleeding  - holding Xarelto until CT head done ruling out bleed Hx DVT  - 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  Dg Abd Portable 1v 12/14/2013 Tip of gastrostomy tube projects in the stomach.  Medical Consultants:   PCCM  Neurology  Other Consultants:   Physical therapy  SW Anti-Infectives:   None  Debbora Presto, MD  [facility] Pager [phone]  If 7PM-7AM, please contact night-coverage [email or web address] Password [email or web address] 12/15/2013, 10:28 AM   LOS: 4 days   HPI/Subjective: No events overnight.   Objective: Filed Vitals:   12/15/13 0700 12/15/13 0800 12/15/13 0900 12/15/13 1000  BP: 118/67 155/37 129/69 133/69  Pulse:  57 54 52  Temp:  98 F (36.7 C)    TempSrc:  Oral    Resp: 25 24 29 26   Height:      Weight:      SpO2: 96% 92% 99% 100%    Intake/Output Summary (Last 24 hours) at 12/15/13 1028 Last data filed at 12/15/13 1017  Gross per 24 hour  Intake 1779.91 ml  Output   2195 ml  Net -415.09 ml    Exam:   General:  Pt is alert, agitated and pulling lines, not following any commands   Cardiovascular: Regular rate and rhythm, no rubs, no gallops  Respiratory: Clear to auscultation bilaterally, no wheezing  Abdomen: Soft, non distended, bowel sounds present, no guarding  Extremities: No edema   Data Reviewed: Basic Metabolic Panel:  Recent Labs Lab 12/10/13 1952 12/11/13 2217 12/13/13 0446 12/14/13 0340 12/15/13 0407  NA 139 140 143 147 150*  K 4.3 4.6 4.3 4.1 3.8  CL 102 100 101 113* 115*  CO2 23 26 22 19 19   GLUCOSE 98 127* 92 102* 146*  BUN 19 21 19 19 16   CREATININE 0.97 1.11 1.11 0.83 0.75  CALCIUM 10.2 10.4 10.6* 9.5 9.6  MG  --   --   --  2.0  --   PHOS  --   --   --  3.3  --     Liver Function Tests:  Recent Labs Lab 12/10/13 1952 12/11/13 2217 12/13/13 0446 12/14/13 0340  AST 40* 41* 48* 43*  ALT 33 35 33 28  ALKPHOS 222* 236* 243* 199*  BILITOT 0.5 0.4 0.8 0.6  PROT 8.1 8.1 8.5* 7.1  ALBUMIN 3.8 3.7 3.9 2.9*    Recent Labs Lab 12/10/13 1952 12/11/13 2217  LIPASE 13 16    Recent Labs Lab 12/13/13 1215  AMMONIA 44   CBC:  Recent Labs Lab 12/10/13 1952 12/11/13 2217 12/13/13 0446 12/14/13 0340 12/15/13 0407  WBC 8.9 7.6 12.5* 8.3 8.1  NEUTROABS 5.8 5.1 9.3*  --   --   HGB 11.2* 10.9* 11.2* 11.0* 10.4*  HCT 34.2* 33.5* 35.4* 34.4* 31.7*  MCV 85.1 85.2 86.1 85.8 84.5  PLT 152 144* 164 133* 128*   Cardiac Enzymes:  Recent Labs Lab 12/13/13 1114  CKTOTAL 1022*  TROPONINI <0.30     Recent Results (from the past 240 hour(s))  URINE CULTURE     Status: None   Collection Time    12/10/13  9:49 PM      Result Value Ref Range Status   Specimen Description URINE, CATHETERIZED   Final   Special Requests NONE   Final   Culture  Setup Time     Final   Value: 12/11/2013 02:44     Performed at [facility]   Colony Count     Final   Value: NO GROWTH     Performed at [facility]   Culture     Final   Value: NO GROWTH     Performed at [facility]   Report Status 12/12/2013 FINAL   Final  CULTURE, BLOOD (ROUTINE X 2)     Status: None   Collection Time    12/13/13  5:[name] AM      Result Value Ref Range Status   Specimen Description BLOOD RIGHT HAND   Final   Special Requests BOTTLES DRAWN AEROBIC AND ANAEROBIC [facility]   Final   Culture  Setup Time     Final   Value: 12/13/2013 08:47     Performed at [facility]   Culture     Final   Value:        BLOOD CULTURE RECEIVED NO GROWTH TO DATE CULTURE WILL BE HELD FOR 5 DAYS BEFORE ISSUING A FINAL NEGATIVE REPORT     Performed at [facility]   Report Status PENDING   Incomplete  CULTURE, BLOOD (ROUTINE X 2)     Status: None   Collection Time  12/13/13  5:09 AM      Result Value Ref Range Status   Specimen Description BLOOD LEFT FOREARM   Final   Special Requests BOTTLES DRAWN AEROBIC AND ANAEROBIC [facility]   Final   Culture  Setup Time     Final   Value: 12/13/2013 08:47     Performed at [facility]   Culture     Final   Value:        BLOOD CULTURE RECEIVED NO GROWTH TO DATE CULTURE WILL BE HELD FOR 5 DAYS BEFORE ISSUING A FINAL NEGATIVE REPORT     Performed at [facility]   Report Status PENDING   Incomplete  URINE CULTURE     Status: None   Collection Time    12/13/13  9:18 AM      Result Value Ref Range Status   Specimen Description URINE, CATHETERIZED   Final   Special Requests Normal   Final   Culture  Setup Time     Final   Value: 12/13/2013 12:33     Performed at [facility] Count     Final   Value: NO GROWTH     Performed at [facility]   Culture     Final   Value: NO GROWTH     Performed at [facility]   Report Status 12/14/2013 FINAL   Final  MRSA PCR SCREENING     Status: Abnormal   Collection Time    12/13/13  9:18 AM      Result Value Ref Range Status   MRSA by PCR POSITIVE (*) NEGATIVE Final   Comment:            The GeneXpert MRSA Assay (FDA     approved for NASAL specimens     only), is one component of a     comprehensive MRSA colonization     surveillance program. It is not     intended to diagnose MRSA     infection nor to guide or     monitor treatment for     MRSA infections.     RESULT CALLED TO, READ BACK BY AND VERIFIED WITH:     K. STRUP RN AT 1055 ON 08.20.15 BY SHUEA     Scheduled Meds: . antiseptic oral rinse  7 mL Mouth Rinse q12n4p  . chlorhexidine  15 mL Mouth Rinse BID  . Chlorhexidine Gluconate Cloth  6 each Topical Q0600  . docusate  100 mg Per Tube BID  . feeding supplement (JEVITY 1.5 CAL/FIBER)  1,000 mL Per Tube Q24H  . mupirocin ointment  1 application Nasal BID  . pantoprazole sodium  40 mg Per Tube Q24H  .  sennosides  5 mL Per Tube BID  . sodium chloride  3 mL Intravenous Q12H  . valproate sodium  500 mg Intravenous Q12H   Continuous Infusions: . dexmedetomidine 1 mcg/kg/hr (12/15/13 0800)  . dextrose 5 % and 0.45% NaCl 50 mL/hr at 12/15/13 0600

## 2013-12-15 NOTE — Progress Notes (Addendum)
PULMONARY / CRITICAL CARE MEDICINE   Name: Hector [name] MRN: [id number] DOB: 03/08/1971    ADMISSION DATE:  12/11/2013 CONSULTATION DATE:  12/13/13  REFERRING MD :  Dr. Myers / [facility]   CHIEF COMPLAINT:  Seizure, concern for airway protection   INITIAL PRESENTATION: 43 y/o M with PMH of TBI who presented to [facility] ER on 8/17 with agitation and R flank pain.  Found to have urinary retention and sent home.  CTA negative for PE.  Returned 8/18 with similar complaints. Parents have reported aggressive behavior and that they are unable to care for him.  IVC papers taken out on patient. He waited in ER for placement until 8/20 am when he was found74m t([phone]([id number] [address])9[place] Poun34 19mQE[facility]uiS , R flank pain.  See studies above.  8/18  Returned to ER with agitation, IVC papers taken out for placement per parents.  Remained in ER for placement 8/20  Admitted to SDU after shaking / agitation, concerns for seizures & ? R airspace disease 8/21  Improved agitation, brady with precedex    SUBJECTIVE: No acute events overnight.  Remains agitated on precedex gtt.   afebrile  VITAL SIGNS: Temp:  [97.6 F (36.4 C)-99 F (37.2 C)] 98 F (36.7 C) (08/22 0800) Pulse Rate:  [52-57] 52 (08/22 1000) Resp:  [14-40] 26 (08/22 1000) BP: (98-155)/(37-81) 133/69 mmHg (08/22 1000) SpO2:  [92 %-100 %] 100 % (08/22 1000) Weight:  [50.3 kg (110 lb 14.3 oz)] 50.3 kg (110 lb 14.3 oz) (08/22 0400)  HEMODYNAMICS:    VENTILATOR SETTINGS:    INTAKE / OUTPUT:  Intake/Output Summary (Last 24 hours) at 12/15/13 1105 Last data filed at 12/15/13 1019  Gross  per 24 hour  Intake 1888.83 ml  Output   2195 ml  Net -306.17 ml    PHYSICAL EXAMINATION: General:  Thin adult male lying in bed, no acute distress , int agitation Neuro:  Eyes open, mumbles incoherently, MAE , follows commands int HEENT:  Mm dry, no jvd  Cardiovascular:  s1s2 rrr, tachy  Lungs:  resp's even/non-labored, lungs bilaterally clear  Abdomen:  Flat, soft, bsx4 active  Musculoskeletal:  No acute deformities  Skin:  Warm/dry, scattered bruises, areas of erythema on limbs  LABS:  CBC  Recent Labs Lab 12/13/13 0446 12/14/13 0340 12/15/13 0407  WBC 12.5* 8.3 8.1  HGB 11.2* 11.0* 10.4*  HCT 35.4* 34.4* 31.7*  PLT 164 133* 128*   Coag's No results found for this basename: APTT, INR,  in the last 168 hours BMET  Recent Labs Lab 12/13/13 0446 12/14/13 0340 12/15/13 0407  NA 143 147 150*  K 4.3 4.1 3.8  CL 101 113* 115*  CO2 22 19 19   BUN 19 19 16   CREATININE 1.11 0.83 0.75  GLUCOSE 92 102* 146*   Electrolytes  Recent Labs Lab 12/13/13 0446 12/14/13 0340 12/15/13 0407  CALCIUM 10.6* 9.5 9.6  MG  --  2.0  --   PHOS  --  3.3  --    Sepsis Markers  Recent Labs Lab 12/13/13 0446 12/13/13 0456 12/15/13 0407  LATICACIDVEN  --  2.38*  --   PROCALCITON 0.38  --  0.12   ABG  Recent Labs Lab 12/13/13 0912  PHART 7.418  PCO2ART 31.5*  PO2ART 70.5*   Liver Enzymes  Recent Labs Lab 12/11/13 2217 12/13/13 0446 12/14/13 0340  AST 41* 48* 43*  ALT 35 33 28  ALKPHOS 236* 243* 199*  BILITOT 0.4 0.8 0.6  ALBUMIN 3.7 3.9 2.9*   Cardiac Enzymes  Recent Labs Lab 12/13/13 1114  TROPONINI <0.30  PROBNP 729.4*   Glucose No results found for this basename: GLUCAP,  in the last 168 hours  Imaging Dg Abd Portable 1v  12/14/2013   CLINICAL DATA:  Gastrostomy tube placement.  EXAM: PORTABLE ABDOMEN - 1 VIEW  COMPARISON:  CT abdomen and pelvis 12/11/2013.  FINDINGS: Gastrostomy tube is in place with the tip projecting in the stomach. A small  amount of contrast material is seen scattered in the colon.  IMPRESSION: Tip of gastrostomy tube projects in the stomach.   Electronically Signed   By: Drusilla Kannerhomas  Dalessio M.D.   On: 12/14/2013 11:36     ASSESSMENT / PLAN:  NEUROLOGIC A:   Severe Agitated Delirium - concern for medication related delirium, paradoxical effects of sedating medications.   Hx TBI - 07/2013 after fall with seizure s/p craniotomy  Seizure Disorder - do not feel current movements are seizure related Expressive Aphasia  P:   RASS goal: 0  Hold home regimen:  Amantadine, bromocriptine,remeron - defer to neurology if these can be started Resume  risperdal  Continue Keppra for seizures Valporate BID for delirium Precedex gtt for agitated delirum -titrate once adequate control with meds via tube Neurology Consult, appreciate input Assess CT head, can go for scan  once agitation improved  PULMONARY OETT n/a A: Tachypnea - ? If this is medication related / recent changes in home regimen.  ABG reassuring.  At Risk Aspiration - in setting of AMS R/O CAP - no overt infiltrate on CT or CXR.  Cough prior to admit without sputum production.  P:   Oxygen to support sats > 92%   CARDIOVASCULAR CVL n/a A:  Tachycardia - neg troponin, mild elevation in BNP Bradycardia - in setting of precedex, BP stable P:  Tele monitoring Change IVF to D51/2NS @ 50 Accept bradycardia with stable BP  RENAL A:   mild Rhabdo - in setting of agitated delirium  Hypernatremia P:   Follow BMP Replace electrolytes as indicated Change to d5W @ 75/h Trend CK -if rising -will need D51/2NS  GASTROINTESTINAL A:   PEG Status - pt pulled out in ER, replaced per staff P:   Meds per PEG NPO otherwise with oral care  HEMATOLOGIC A:   Mild Anemia - without acute bleeding Hx DVT - upper & lower extremity DVT during admission in 07/2013, on xarelto at baseline P:  Monitor CBC Hold Xarelto until CT head cleared, consider if needs to  continue further post admission   INFECTIOUS A:   R/O CAP - CT / CXR not concerning for overt infiltrate, mild haziness in RUL/RML.  Afebrile.   MRSA PCR + Fever  - low grade on admit, no further spikes P:   BCx2 8/20 >>ng UC 8/20 >>neg Hold further antibiotics Monitor fever curve, leukocytosis PRN APAP  ENDOCRINE A:   No acute issues - TSH wnl P:   Assess TSH  TODAY'S SUMMARY:  43 y/o M with PMH of TBI on multiple medications for agitation (with increase /  change in meds as of late) admitted for agitated delirium & tachycardia.  Concern for medication related delirium / ? serotonin syndrome.  +/- rhabdo with significant agitation.  CT head pending.  Neurology consulted.     PCCM can take over while on precedex  I talked to the family for a while yesterday, so its still not clear what his baseline could be.  Apparently the first week after coming home from his initial hospital stay for the TBI he was able to walk and perform ADLs.  So suspect this decline is due to polypharmacy- At this point no clear evidence of an alternative process.      I have personally obtained a history, examined the patient, evaluated laboratory and imaging results, formulated the assessment and plan and placed orders.  CRITICAL CARE: The patient is critically ill with multiple organ systems failure and requires high complexity decision making for assessment and support, frequent evaluation and titration of therapies, application of advanced monitoring technologies and extensive interpretation of multiple databases. Critical Care Time devoted to patient care services described in this note is 31 minutes.    Cyril Mourning MD. Tonny Bollman. [place] Pager [phone] If no response call [phone]    12/15/2013, 11:05 AM

## 2013-12-15 NOTE — Progress Notes (Signed)
Per Dr. Belinda BlockGiddings, hold on CT of Head. Will reassess 8/23 during day.

## 2013-12-15 NOTE — Progress Notes (Signed)
PT Cancellation Note  Patient Details Name: Hector NianRobert W [name] MRN: [id number] DOB: Jun 23, 1970   Cancelled Treatment:    Reason Eval/Treat Not Completed: Patient not medically readyper RN. Will check on patient 12/17/13.   Rada HayHill, Sriman Tally Elizabeth 12/15/2013, 9:52 AM Blanchard KelchKaren Ingvald Theisen PT [phone]

## 2013-12-16 ENCOUNTER — Inpatient Hospital Stay (HOSPITAL_COMMUNITY): Payer: Medicaid Other

## 2013-12-16 LAB — BASIC METABOLIC PANEL
Anion gap: 16 — ABNORMAL HIGH (ref 5–15)
BUN: 9 mg/dL (ref 6–23)
CHLORIDE: 110 meq/L (ref 96–112)
CO2: 22 mEq/L (ref 19–32)
Calcium: 9 mg/dL (ref 8.4–10.5)
Creatinine, Ser: 0.64 mg/dL (ref 0.50–1.35)
GFR calc non Af Amer: >90 mL/min (ref >90)
GLUCOSE: 111 mg/dL — AB (ref 70–99)
POTASSIUM: 3.2 meq/L — AB (ref 3.7–5.3)
Sodium: 148 mEq/L — ABNORMAL HIGH (ref 137–147)

## 2013-12-16 LAB — CBC
HEMATOCRIT: 31.1 % — AB (ref 39.0–52.0)
Hemoglobin: 10.2 g/dL — ABNORMAL LOW (ref 13.0–17.0)
MCH: 27.7 pg (ref 26.0–34.0)
MCHC: 32.8 g/dL (ref 30.0–36.0)
MCV: 84.5 fL (ref 78.0–100.0)
Platelets: 125 10*3/uL — ABNORMAL LOW (ref 150–400)
RBC: 3.68 MIL/uL — ABNORMAL LOW (ref 4.22–5.81)
RDW: 15.5 % (ref 11.5–15.5)
WBC: 5.6 10*3/uL (ref 4.0–10.5)

## 2013-12-16 LAB — CK: Total CK: 215 U/L (ref 7–232)

## 2013-12-16 MED ORDER — POTASSIUM CHLORIDE 20 MEQ/15ML (10%) PO LIQD
40.0000 meq | Freq: Once | ORAL | Status: AC
  Administered 2013-12-16: 40 meq
  Filled 2013-12-16: qty 30

## 2013-12-16 MED ORDER — MORPHINE SULFATE 4 MG/ML IJ SOLN
4.0000 mg | Freq: Once | INTRAMUSCULAR | Status: AC
  Administered 2013-12-16: 4 mg via INTRAVENOUS

## 2013-12-16 MED ORDER — DEXMEDETOMIDINE HCL IN NACL 400 MCG/100ML IV SOLN
0.4000 ug/kg/h | INTRAVENOUS | Status: DC
  Administered 2013-12-16 – 2013-12-17 (×4): 1.2 ug/kg/h via INTRAVENOUS
  Filled 2013-12-16 (×3): qty 100

## 2013-12-16 MED ORDER — MORPHINE SULFATE 2 MG/ML IJ SOLN
2.0000 mg | INTRAMUSCULAR | Status: DC | PRN
  Administered 2013-12-17: 4 mg via INTRAVENOUS
  Administered 2013-12-17 (×2): 2 mg via INTRAVENOUS
  Administered 2013-12-17: 4 mg via INTRAVENOUS
  Administered 2013-12-17: 2 mg via INTRAVENOUS
  Filled 2013-12-16: qty 2
  Filled 2013-12-16: qty 1
  Filled 2013-12-16: qty 2
  Filled 2013-12-16 (×2): qty 1

## 2013-12-16 NOTE — Progress Notes (Signed)
PULMONARY / CRITICAL CARE MEDICINE   Name: Hector [name] MRN: [id number] DOB: [birth year]    ADMISSION DATE:  12/11/2013 CONSULTATION DATE:  12/13/13  REFERRING MD :  Dr. Izola Price / [facility]   CHIEF COMPLAINT:  Seizure, concern for airway protection   INITIAL PRESENTATION: 43 y/o M with PMH of TBI who presented to [facility] ER on 8/17 with agitation and R flank pain.  Found to have urinary retention and sent home.  CTA negative for PE.  Returned 8/18 with similar complaints. Parents have reported aggressive behavior and that they are unable to care for him.  IVC papers taken out on patient. He waited in ER for placement until 8/20 am when he was found to have jerking movements concerning for seizures.  PCCM consulted for evaluation.    STUDIES:  8/17  Renal US >> normal renal US 8/17  CTA Chest >> no acute disease, mild centrilobular emphysema 8/19  CT ABD/Pelvis >> moderate amt stool in large bowel, G-tube, no acute process, bilateral non-obs up to 3mm stones 8/21  CT Head >>   SIGNIFICANT EVENTS: 8/17  Seen in ER with agitation & urinary retention, R flank pain.  See studies above.  8/18  Returned to ER with agitation, IVC papers taken out for placement per parents.  Remained in ER for placement 8/20  Admitted to SDU after shaking / agitation, concerns for seizures & ? R airspace disease 8/21  Improved agitation, brady with precedex    SUBJECTIVE: No acute events overnight.  Remains intermittently agitated on precedex gtt.  , calmer today per RN afebrile  VITAL SIGNS: Temp:  [97.4 F (36.3 C)-98.5 F (36.9 C)] 97.4 F (36.3 C) (08/23 0800) Pulse Rate:  [55-136] 63 (08/23 1000) Resp:  [14-55] 14 (08/23 1000) BP: (99-186)/(49-98) 102/60 mmHg (08/23 1000) SpO2:  [88 %-100 %] 99 % (08/23 1000) FiO2 (%):  [35 %] 35 % (08/23 0930) Weight:  [52.56 kg (115 lb 14 oz)] 52.56 kg (115 lb 14 oz) (08/23 0430)  HEMODYNAMICS:    VENTILATOR SETTINGS: Vent Mode:  [-]  FiO2 (%):  [35 %] 35  %  INTAKE / OUTPUT:  Intake/Output Summary (Last 24 hours) at 12/16/13 1054 Last data filed at 12/16/13 1000  Gross per 24 hour  Intake 2788.68 ml  Output   1385 ml  Net 1403.68 ml    PHYSICAL EXAMINATION: General:  Thin adult male lying in bed, no acute distress , int agitation Neuro:  Eyes open, mumbles incoherently, MAE , follows commands int HEENT:  Mm dry, no jvd  Cardiovascular:  s1s2 rrr, tachy  Lungs:  resp's even/non-labored, lungs bilaterally clear  Abdomen:  Flat, soft, bsx4 active  Musculoskeletal:  No acute deformities  Skin:  Warm/dry, scattered bruises, areas of erythema on limbs  LABS:  CBC  Recent Labs Lab 12/14/13 0340 12/15/13 0407 12/16/13 0400  WBC 8.3 8.1 5.6  HGB 11.0* 10.4* 10.2*  HCT 34.4* 31.7* 31.1*  PLT 133* 128* 125*   Coag's No results found for this basename: APTT, INR,  in the last 168 hours BMET  Recent Labs Lab 12/14/13 0340 12/15/13 0407 12/16/13 0400  NA 147 150* 148*  K 4.1 3.8 3.2*  CL 113* 115* 110  CO2 BUN CREATININE 0.83 0.75 0.64  GLUCOSE 102* 146* 111*   Electrolytes  Recent Labs Lab 12/14/13 0340 12/15/13 0407 12/16/13 0400  CALCIUM 9.5 9.6 9.0  MG 2.0  --   --  PHOS 3.3  --   --    Sepsis Markers  Recent Labs Lab 12/13/13 0446 12/13/13 0456 12/15/13 0407  LATICACIDVEN  --  2.38*  --   PROCALCITON 0.38  --  0.12   ABG  Recent Labs Lab 12/13/13 0912  PHART 7.418  PCO2ART 31.5*  PO2ART 70.5*   Liver Enzymes  Recent Labs Lab 12/11/13 2217 12/13/13 0446 12/14/13 0340  AST 41* 48* 43*  ALT 35 33 28  ALKPHOS 236* 243* 199*  BILITOT 0.4 0.8 0.6  ALBUMIN 3.7 3.9 2.9*   Cardiac Enzymes  Recent Labs Lab 12/13/13 1114  TROPONINI <0.30  PROBNP 729.4*   Glucose No results found for this basename: GLUCAP,  in the last 168 hours  Imaging No results found.   ASSESSMENT / PLAN:  NEUROLOGIC A:   Apparently the first week after coming home from his initial  hospital stay for the TBI he was able to walk and perform ADLs.  So suspect this decline is due to polypharmacy- At this point no clear evidence of an alternative process.    Severe Agitated Delirium - concern for medication related delirium, paradoxical effects of sedating medications.   Hx TBI - 07/2013 after fall with seizure s/p craniotomy  Seizure Disorder - do not feel current movements are seizure related Expressive Aphasia  P:   RASS goal: 0  Hold home regimen:  Amantadine, bromocriptine,remeron - defer to neurology if these can be started Ct risperdal  Continue Keppra for seizures Valporate BID for delirium Precedex gtt for agitated delirum -titrate once adequate control with meds via tube Neurology Consult, appreciate input Assess CT head, can go for scan now that agitation improved  PULMONARY OETT n/a A: Tachypnea - ? If this is medication related / recent changes in home regimen.  ABG reassuring.  At Risk Aspiration - in setting of AMS R/O CAP - no overt infiltrate on CT or CXR.  Cough prior to admit without sputum production.  P:   Oxygen to support sats > 92%   CARDIOVASCULAR CVL n/a A:  Tachycardia - neg troponin, mild elevation in BNP Bradycardia - in setting of precedex, BP stable P:  Tele monitoring  Accept bradycardia with stable BP  RENAL A:   mild Rhabdo - in setting of agitated delirium , resolved Hypernatremia P:   Follow BMP Replace electrolytes as indicated Ct  d5W @ 75/h   GASTROINTESTINAL A:   PEG Status - pt pulled out in ER, replaced per staff Protein calorie malnutrition P:   Meds per PEG NPO otherwise with oral care Ct TFs  HEMATOLOGIC A:   Mild Anemia - without acute bleeding Hx DVT - upper & lower extremity DVT during admission in 07/2013, on xarelto at baseline P:  Monitor CBC Hold Xarelto until CT head cleared, consider if needs to continue further post admission   INFECTIOUS A:   R/O CAP - CT / CXR not concerning for  overt infiltrate, mild haziness in RUL/RML.  Afebrile.   MRSA PCR + Fever  - low grade on admit, no further spikes P:   BCx2 8/20 >>ng UC 8/20 >>neg Hold further antibiotics Monitor fever curve, leukocytosis PRN APAP  ENDOCRINE A:   No acute issues - TSH wnl P:   CBGs while on TFs  TODAY'S SUMMARY:  43 y/o M with PMH of TBI on multiple medications for agitation (with increase / change in meds as of late) admitted for agitated delirium & tachycardia.  Concern for medication  related delirium / ? serotonin syndrome.  +/- rhabdo with significant agitation.  CT head pending.  Neurology consulted.     PCCM can take over while on precedex   I have personally obtained a history, examined the patient, evaluated laboratory and imaging results, formulated the assessment and plan and placed orders.  CRITICAL CARE: The patient is critically ill with multiple organ systems failure and requires high complexity decision making for assessment and support, frequent evaluation and titration of therapies, application of advanced monitoring technologies and extensive interpretation of multiple databases. Critical Care Time devoted to patient care services described in this note is 31 minutes.    Cyril Mourning MD. Tonny Bollman. [facility] Pager [phone] If no response call [phone]    12/16/2013, 10:54 AM

## 2013-12-17 DIAGNOSIS — IMO0002 Reserved for concepts with insufficient information to code with codable children: Secondary | ICD-10-CM

## 2013-12-17 DIAGNOSIS — Z008 Encounter for other general examination: Secondary | ICD-10-CM

## 2013-12-17 LAB — BASIC METABOLIC PANEL
ANION GAP: 12 (ref 5–15)
BUN: 4 mg/dL — ABNORMAL LOW (ref 6–23)
CALCIUM: 8.8 mg/dL (ref 8.4–10.5)
CHLORIDE: 102 meq/L (ref 96–112)
CO2: 26 mEq/L (ref 19–32)
Creatinine, Ser: 0.5 mg/dL (ref 0.50–1.35)
GFR calc non Af Amer: >90 mL/min (ref >90)
Glucose, Bld: 149 mg/dL — ABNORMAL HIGH (ref 70–99)
Potassium: 3.6 mEq/L — ABNORMAL LOW (ref 3.7–5.3)
SODIUM: 140 meq/L (ref 137–147)

## 2013-12-17 LAB — CBC
HCT: 31 % — ABNORMAL LOW (ref 39.0–52.0)
Hemoglobin: 10.1 g/dL — ABNORMAL LOW (ref 13.0–17.0)
MCH: 27.7 pg (ref 26.0–34.0)
MCHC: 32.6 g/dL (ref 30.0–36.0)
MCV: 84.9 fL (ref 78.0–100.0)
Platelets: 114 10*3/uL — ABNORMAL LOW (ref 150–400)
RBC: 3.65 MIL/uL — ABNORMAL LOW (ref 4.22–5.81)
RDW: 15.4 % (ref 11.5–15.5)
WBC: 8.1 10*3/uL (ref 4.0–10.5)

## 2013-12-17 LAB — PROCALCITONIN: PROCALCITONIN: 0.21 ng/mL

## 2013-12-17 MED ORDER — VITAL HIGH PROTEIN PO LIQD: 1000.0000 mL | ORAL | Status: DC

## 2013-12-17 MED ORDER — JEVITY 1.5 CAL/FIBER PO LIQD
1000.0000 mL | ORAL | Status: DC
  Administered 2013-12-17 – 2013-12-26 (×10): 1000 mL
  Filled 2013-12-17 (×10): qty 1000

## 2013-12-17 MED ORDER — DEXMEDETOMIDINE HCL IN NACL 200 MCG/50ML IV SOLN
0.2000 ug/kg/h | INTRAVENOUS | Status: DC
Start: 2013-12-17 — End: 2013-12-19
  Administered 2013-12-17: 1 ug/kg/h via INTRAVENOUS
  Administered 2013-12-17: 0.2 ug/kg/h via INTRAVENOUS
  Administered 2013-12-17: 0.6 ug/kg/h via INTRAVENOUS
  Administered 2013-12-18: 1.2 ug/kg/h via INTRAVENOUS
  Administered 2013-12-18: 0.8 ug/kg/h via INTRAVENOUS
  Administered 2013-12-18: 0.6 ug/kg/h via INTRAVENOUS
  Administered 2013-12-18: 1 ug/kg/h via INTRAVENOUS
  Administered 2013-12-18 – 2013-12-19 (×3): 1.2 ug/kg/h via INTRAVENOUS
  Administered 2013-12-19: 0.8 ug/kg/h via INTRAVENOUS
  Filled 2013-12-17 (×9): qty 50

## 2013-12-17 MED ORDER — FENTANYL CITRATE 0.05 MG/ML IJ SOLN
50.0000 ug | INTRAMUSCULAR | Status: DC | PRN
  Administered 2013-12-17 – 2013-12-20 (×14): 50 ug via INTRAVENOUS
  Filled 2013-12-17 (×15): qty 2

## 2013-12-17 MED ORDER — NICOTINE 14 MG/24HR TD PT24
14.0000 mg | MEDICATED_PATCH | Freq: Every day | TRANSDERMAL | Status: DC
  Administered 2013-12-17 – 2013-12-20 (×4): 14 mg via TRANSDERMAL
  Filled 2013-12-17 (×4): qty 1

## 2013-12-17 MED ORDER — FREE WATER
200.0000 mL | Freq: Four times a day (QID) | Status: DC
  Administered 2013-12-17 – 2014-01-03 (×67): 200 mL

## 2013-12-17 MED ORDER — CLONAZEPAM 0.5 MG PO TABS
0.5000 mg | ORAL_TABLET | Freq: Three times a day (TID) | ORAL | Status: DC
  Administered 2013-12-17 – 2013-12-18 (×3): 0.5 mg
  Filled 2013-12-17 (×3): qty 1

## 2013-12-17 MED ORDER — CLONAZEPAM 0.1 MG/ML ORAL SUSPENSION: 0.5000 mg | Freq: Three times a day (TID) | ORAL | Status: DC

## 2013-12-17 NOTE — Progress Notes (Signed)
NUTRITION FOLLOW UP  Intervention:   - Advance TF via PEG of Jevity 1.5 at 64m/hr by 139mevery 4 hours to goal of 5084mr. Goal rate will provide 1800 calories, 76g protein, and 912m59mee water and meet 100% of estimated nutritional needs - Initiate adult enteral protocol  - Diet advancement per MD - RD to continue to monitor   Nutrition Dx:   Inadequate oral intake related to inability to eat as evidenced by NPO - ongoing    Goal:   1. Advance diet as tolerated to regular diet - not met, remains NPO 2. 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 Patient was comatose for prolonged period of time and then regained consciousness he was no longer verbal. Patient was discharged to home but family states have been aggressive towards them. Patient now requires 24-hour care. Family states that 3 days ago patient started to become agitated grabbing at the right flank/side. Pt in restraints. Found to have acute agitated delirium.   8/21: - Per telephone conversation with father, pt was on a regular/thin liquid diet at home and usually eats 50% of his meals and drinks 2-3 Ensure/day in addition to lots of caffeine free mountain dew  - Sample meals include mac and cheese with protein and vegetables  - Said that in the past 3-4 days pt has only been taking 1-2 bites at mealtimes  - Father said lowest weight was 105 pounds and earlier this week it was 110 pounds  - Said the only thing put through pt's PEG at home is 6.5 oz of water three times/day  - Reported pt started coughing a lot with meals on Tuesday, usually has no problems  - Denies that pt has any problems chewing  - Appears thin with mild muscle wasting in clavicles  - Per  NP, plan is to start trickle TF today  - TF: Jevity 1.5 at 10ml98mvia PEG - 360 calories, 15g protein meeting 22% estimated calorie needs, 23% estimated protein needs  8/24: - Noted pt with agitation and restlessness last night with huge mucous plug removed from throat - TF increased to 20ml/14mn 8/22 - Per RN, pt tolerating TF well with 190ml r19muals this morning - Per conversation with NP, okay for RD to manage and advance TF to goal rate   TF: Jevity 1.5 at 20ml/hr7mrovides 720 calories, 31g protein and meets 44% estimated calorie needs, 48% estimated protein needs  Potassium slightly low, getting liquid replacement per PEG   Height: Ht Readings from Last 1 Encounters:  12/13/13 5' 7"  (1.702 m)    Weight Status:   Wt Readings from Last 1 Encounters:  12/17/13 114 lb 3.2 oz (51.8 kg)  Admit wt         120 lb (54.4 kg)  Net I/Os: +2.4L  Re-estimated needs:  Kcal: 1650-1850  Protein: 65-80g  Fluid: 1.6-1.8L/day    Skin: +1 RUE, LUE edema, Right foot abrasion, right arm scab   Diet Order: NPO   Intake/Output Summary (Last 24 hours) at 12/17/13 1002 Last data filed at 12/17/13 0947  Gross per 24 hour  Intake 3508.01 ml  Output   2325 ml  Net 1183.01 ml  Last BM: PTA   Labs:   Recent Labs Lab 12/13/13 0446 12/14/13 0340 12/15/13 0407 12/16/13 0400 12/17/13 0343  NA 143 147 150* 148* 140  K 4.3 4.1 3.8 3.2* 3.6*  CL 101 113* 115* 110 102  CO2 22 19 19 22 26   BUN 19 19 16 9  4*  CREATININE 1.11 0.83 0.75 0.64 0.50  CALCIUM 10.6* 9.5 9.6 9.0 8.8  MG  --  2.0  --   --   --   PHOS  --  3.3  --   --   --   GLUCOSE 92 102* 146* 111* 149*    CBG (last 3)  No results found for this basename: GLUCAP,  in the last 72 hours  Scheduled Meds: . antiseptic oral rinse  7 mL Mouth Rinse q12n4p  . chlorhexidine  15 mL Mouth Rinse BID  . Chlorhexidine Gluconate Cloth  6 each Topical Q0600  . docusate  100 mg Per Tube BID  . feeding supplement (JEVITY 1.5  CAL/FIBER)  1,000 mL Per Tube Q24H  . mupirocin ointment  1 application Nasal BID  . pantoprazole sodium  40 mg Per Tube Q24H  . risperiDONE  0.5 mg Oral BID  . sennosides  5 mL Per Tube BID  . sodium chloride  3 mL Intravenous Q12H  . valproate sodium  500 mg Intravenous Q12H    Continuous Infusions: . dexmedetomidine 1.2 mcg/kg/hr (12/17/13 0857)  . dextrose 75 mL/hr at 12/16/13 [place], [address], [place] [phone] Pager [phone] Weekend/After Hours Pager

## 2013-12-17 NOTE — Progress Notes (Signed)
PT Cancellation Note  Patient Details Name: Hector [name] MRN: [id number] DOB: [birth year]   Cancelled Treatment:    Reason Eval/Treat Not Completed: Medical issues which prohibited therapy   [name] 12/17/2013, 11:15 AM Blanchard Kelch PT [phone]

## 2013-12-17 NOTE — Progress Notes (Signed)
CSW continuing to follow.  CSW reviewed chart and spoke with RN. Pt remains agitated and on restraints. Pt on IV precedex and had CT of the head today.   Pt not yet appropriate for initiation of SNF search due to the above concerns.  CSW will continue to monitor and facilitate initiation of SNF search when appropriate.  CSW to continue to follow.  Loletta Specter, MSW, LCSW Clinical Social Work [phone]

## 2013-12-17 NOTE — Progress Notes (Signed)
PULMONARY / CRITICAL CARE MEDICINE   Name: Hector [name] MRN: [id number] DOB: 07-23-1970    ADMISSION DATE:  12/11/2013 CONSULTATION DATE:  12/13/13  REFERRING MD :  Dr. Izola Price / [facility]   CHIEF COMPLAINT:  Seizure, concern for airway protection   INITIAL PRESENTATION: 43 y/o M with PMH of TBI who presented to [facility] ER on 8/17 with agitation and R flank pain.  Found to have urinary retention and sent home.  CTA negative for PE.  Returned 8/18 with similar complaints. Parents have reported aggressive behavior and that they are unable to care for him.  IVC papers taken out on patient. He waited in ER for placement until 8/20 am when he was found to have jerking movements concerning for seizures.  PCCM consulted for evaluation.    STUDIES:  8/17  Renal US >> normal renal US 8/17  CTA Chest >> no acute disease, mild centrilobular emphysema 8/19  CT ABD/Pelvis >> moderate amt stool in large bowel, G-tube, no acute process, bilateral non-obs up to 3mm stones  SIGNIFICANT EVENTS: 8/17  Seen in ER with agitation & urinary retention, R flank pain.  See studies above.  8/18  Returned to ER with agitation, IVC papers taken out for placement per parents.  Remained in ER for placement 8/20  Admitted to SDU after shaking / agitation, concerns for seizures & ? R airspace disease 8/21  Improved agitation, brady with precedex  SUBJECTIVE:  Agitated this AM >> improved now.  Remains on precedex.  VITAL SIGNS: Temp:  [96.9 F (36.1 C)-98.6 F (37 C)] 98.6 F (37 C) (08/24 0800) Pulse Rate:  [64-122] 96 (08/24 1000) Resp:  [13-39] 28 (08/24 1000) BP: (101-152)/(52-115) 124/58 mmHg (08/24 1000) SpO2:  [90 %-100 %] 97 % (08/24 1000) FiO2 (%):  [35 %-55 %] 55 % (08/24 0800) Weight:  [114 lb 3.2 oz (51.8 kg)] 114 lb 3.2 oz (51.8 kg) (08/24 0000)  INTAKE / OUTPUT:  Intake/Output Summary (Last 24 hours) at 12/17/13 1046 Last data filed at 12/17/13 1000  Gross per 24 hour  Intake 3659.31 ml  Output    2550 ml  Net 1109.31 ml    PHYSICAL EXAMINATION: General: no distress at present Neuro: opens eyes with stimulation, no following commands HEENT: no sinus tenderness  Cardiovascular: regular, tachycardic Lungs: no wheeze Abdomen: soft, non tender, PEG site clean Musculoskeletal: no edema Skin: no rashes  LABS:  CBC  Recent Labs Lab 12/15/13 0407 12/16/13 0400 12/17/13 0343  WBC 8.1 5.6 8.1  HGB 10.4* 10.2* 10.1*  HCT 31.7* 31.1* 31.0*  PLT 128* 125* 114*   BMET  Recent Labs Lab 12/15/13 0407 12/16/13 0400 12/17/13 0343  NA 150* 148* 140  K 3.8 3.2* 3.6*  CL 115* 110 102  CO2 BUN 16 9 4*  CREATININE 0.75 0.64 0.50  GLUCOSE 146* 111* 149*   Electrolytes  Recent Labs Lab 12/14/13 0340 12/15/13 0407 12/16/13 0400 12/17/13 0343  CALCIUM 9.5 9.6 9.0 8.8  MG 2.0  --   --   --   PHOS 3.3  --   --   --    Sepsis Markers  Recent Labs Lab 12/13/13 0446 12/13/13 0456 12/15/13 0407 12/17/13 0343  LATICACIDVEN  --  2.38*  --   --   PROCALCITON 0.38  --  0.12 0.21   ABG  Recent Labs Lab 12/13/13 0912  PHART 7.418  PCO2ART 31.5*  PO2ART 70.5*   Liver Enzymes  Recent Labs  Lab 12/11/13 2217 12/13/13 0446 12/14/13 0340  AST 41* 48* 43*  ALT 35 33 28  ALKPHOS 236* 243* 199*  BILITOT 0.4 0.8 0.6  ALBUMIN 3.7 3.9 2.9*   Cardiac Enzymes  Recent Labs Lab 12/13/13 1114  TROPONINI <0.30  PROBNP 729.4*   Imaging Dg Chest Port 1 View  12/17/2013   CLINICAL DATA:  Hypoxia.  EXAM: PORTABLE CHEST - 1 VIEW  COMPARISON:  12/13/2013.  FINDINGS: There is less patient rotation to the right with no gross change in a normal sized heart. Decreased inspiration with diffusely increased prominence of the interstitial markings. Evidence of a small right pleural effusion. Diffuse osteopenia.  IMPRESSION: 1. Decreased inspiration with increased crowding of the interstitial markings and possibly increased interstitial pneumonitis or pulmonary edema. 2.  Probable small right pleural effusion.   Electronically Signed   By: Gordan Payment M.D.   On: 12/17/2013 01:05     ASSESSMENT / PLAN:  NEUROLOGY Severe agitated delirium ?from polypharmacy, and possible paradoxical response to sedating meds. 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 Expressive aphasia. Hx of ETOH. P:   RASS goal: 0  Wean off precedex as tolerated Hold home regimen:  Amantadine, bromocriptine, remeron Continue risperdal, valproate Add klonopin 0.5 mg tid 8/24 Prn haldol, fentanyl F/u CT head when stable to have procedure done Might need psych to re-assess >> seen on day of admission, but not since >> defer to primary team  PULMONARY Hx of tobacco abuse. P: Monitor oxygenation Add nicotine patch  CARDIOLOGY Sinus tachycardia in setting of acute agitated delirium. Hx DVT - upper & lower extremity DVT during admission in 07/2013, on xarelto at baseline. P:  Monitor hemodynamics Continue xarelto  RENAL Mild Rhabdo in setting of agitation >> resolved. Hypernatremia >> resolved. P:   Free water Decrease IV fluids to 50 ml/hr  GASTROINTESTINAL Dysphagia s/p PEG. Protein calorie malnutrition. P:   Continue tube feeds Protonix for SUP  HEMATOLOGIC Anemia of chronic disease. P:  Monitor CBC  INFECTIOUS A:   No evidence for infection. P:   Monitor clinically off Abx  Blood cx 8/20 >>   ENDOCRINE A:   No acute issues. P:   Monitor blood sugar on BMET  TODAY'S SUMMARY: Persistent agitation.  Continue precedex.  Add klonopin.  CC time 40 minutes.  Coralyn Helling, MD [facility] Pulmonary/Critical Care 12/17/2013, 10:46 AM Pager:  [phone] After 3pm call: [phone]

## 2013-12-17 NOTE — Progress Notes (Signed)
CARE MANAGEMENT NOTE 12/17/2013  Patient:  MARLAN, STEWARD   Account Number:  [id number]  Date Initiated:  12/13/2013  Documentation initiated by:  Berenda Morale  Subjective/Objective Assessment:   See ED Case Management Note     Action/Plan:   Anticipated DC Date:  12/20/2013   Anticipated DC Plan:  SKILLED NURSING FACILITY  In-house referral  NA      DC Planning Services  CM consult      PAC Choice  NA   Choice offered to / List presented to:  NA      DME agency  NA     HH arranged  NA      HH agency  NA   Status of service:  In process, will continue to follow Medicare Important Message given?  NA - LOS <3 / Initial given by admissions (If response is "NO", the following Medicare IM given date fields will be blank) Date Medicare IM given:   Medicare IM given by:   Date Additional Medicare IM given:   Additional Medicare IM given by:    Discharge Disposition:    Per UR Regulation:  Reviewed for med. necessity/level of care/duration of stay  If discussed at Long Length of Stay Meetings, dates discussed:    Comments:  [id number]/Rhonda Davis,RN,BSN,CCM: Patient remains highly agitiated, confused., poss etoh w/d iv precedex.  12/13/13 Sandford Craze Rn, BSN, NCM Will await PT recommendations and assist with DC needs.

## 2013-12-17 NOTE — Progress Notes (Signed)
Removed large amount of dried sputum from pt mouth at the start of shift, however pt remained agitated and restless.  Pt also had some episodes of sats as loow as 88% on 4 l [place].  Wheezes noted.  02 adjusted multiple times up to NRB. E-link, MD was made aware.  New orders received and initiated.  Pt gasped for air and more agitated. Pt asked if there was something in his throat, pt motioned yes.  Lisabeth Register, RN at bedside.  Huge mucus plug removed from pt throat.  Pt mouth breather, pt placed on 55% v- mask.  Breath sounds improved and pt much calmer.  Remains on precedex drip and in restraints.  Will continue to monitor and report.

## 2013-12-17 NOTE — Progress Notes (Signed)
Subjective: Patient is awake and tracks my movements in the room, follows no commands, is not agitated. Moving all extremities  Objective: Current vital signs: BP 128/66  Pulse 122  Temp(Src) 98.6 F (37 C) (Axillary)  Resp 33  Ht  (1.702 m)  Wt 51.8 kg (114 lb 3.2 oz)  BMI 17.88 kg/m2  SpO2 93% Vital signs in last 24 hours: Temp:  [96.9 F (36.1 C)-98.6 F (37 C)] 98.6 F (37 C) (08/24 0800) Pulse Rate:  [63-122] 122 (08/24 0900) Resp:  [13-39] 33 (08/24 0900) BP: (101-152)/(52-115) 128/66 mmHg (08/24 0900) SpO2:  [90 %-100 %] 93 % (08/24 0900) FiO2 (%):  [35 %-55 %] 55 % (08/24 0800) Weight:  [51.8 kg (114 lb 3.2 oz)] 51.8 kg (114 lb 3.2 oz) (08/24 0000)  Intake/Output from previous day: 08/23 0701 - 08/24 0700 In: 3552.4 [I.V.:2417.4; NG/GT:950; IV Piggyback:105] Out: 2100 [Urine:2100] Intake/Output this shift: Total I/O In: 302.6 [I.V.:202.6; NG/GT:100] Out: 450 [Urine:450] Nutritional status: NPO  Neurologic Exam: General: NAD Mental Status: Alert, tracks my movements, follows no commands. .  No verbal output.   Cranial Nerves: II: Visual fields blinks to threat bilaterally, pupils equal, round, reactive to light and accommodation III,IV, VI: ptosis not present, extra-ocular motions intact bilaterally V,VII: face symmetric, facial light touch sensation normal bilaterally VIII: hearing normal bilaterally IX,X: gag reflex present XI: bilateral shoulder shrug XII: midline tongue   Motor: Right : Upper extremity   5/5    Left:     Upper extremity   5/5  Lower extremity   5/5     Lower extremity   5/5 Moving spontaneously but not to commands Sensory: Pinprick and light touch intact throughout, bilaterally Deep Tendon Reflexes:  Right: Upper Extremity   Left: Upper extremity   biceps (C-5 to C-6) 2/4   biceps (C-5 to C-6) 2/4 tricep (C7) 2/4    triceps (C7) 2/4 Brachioradialis (C6) 2/4  Brachioradialis (C6) 2/4  Lower Extremity Lower Extremity   quadriceps (L-2 to L-4) 2/4   quadriceps (L-2 to L-4) 2/4 Achilles (S1) 2/4   Achilles (S1) 2/4  Plantars: Right: downgoing   Left: downgoing    Lab Results: Basic Metabolic Panel:  Recent Labs Lab 12/13/13 0446 12/14/13 0340 12/15/13 0407 12/16/13 0400 12/17/13 0343  NA 143 147 150* 148* 140  K 4.3 4.1 3.8 3.2* 3.6*  CL 101 113* 115* 110 102  CO2 GLUCOSE 92 102* 146* 111* 149*  BUN 4*  CREATININE 1.11 0.83 0.75 0.64 0.50  CALCIUM 10.6* 9.5 9.6 9.0 8.8  MG  --  2.0  --   --   --   PHOS  --  3.3  --   --   --     Liver Function Tests:  Recent Labs Lab 12/10/13 1952 12/11/13 2217 12/13/13 0446 12/14/13 0340  AST 40* 41* 48* 43*  ALT 33 35 33 28  ALKPHOS 222* 236* 243* 199*  BILITOT 0.5 0.4 0.8 0.6  PROT 8.1 8.1 8.5* 7.1  ALBUMIN 3.8 3.7 3.9 2.9*    Recent Labs Lab 12/10/13 1952 12/11/13 2217  LIPASE 13 16    Recent Labs Lab 12/13/13 1215  AMMONIA 44    CBC:  Recent Labs Lab 12/10/13 1952 12/11/13 2217 12/13/13 0446 12/14/13 0340 12/15/13 0407 12/16/13 0400 12/17/13 0343  WBC 8.9 7.6 12.5* 8.3 8.1 5.6 8.1  NEUTROABS 5.8 5.1 9.3*  --   --   --   --  HGB 11.2* 10.9* 11.2* 11.0* 10.4* 10.2* 10.1*  HCT 34.2* 33.5* 35.4* 34.4* 31.7* 31.1* 31.0*  MCV 85.1 85.2 86.1 85.8 84.5 84.5 84.9  PLT 152 144* 164 133* 128* 125* 114*    Cardiac Enzymes:  Recent Labs Lab 12/13/13 1114 12/16/13 0400  CKTOTAL 1022* 215  TROPONINI <0.30  --     Lipid Panel: No results found for this basename: CHOL, TRIG, HDL, CHOLHDL, VLDL, LDLCALC,  in the last 168 hours  CBG: No results found for this basename: GLUCAP,  in the last 168 hours  Microbiology: Results for orders placed during the hospital encounter of 12/11/13  CULTURE, BLOOD (ROUTINE X 2)     Status: None   Collection Time    12/13/13  5:04 AM      Result Value Ref Range Status   Specimen Description BLOOD RIGHT HAND   Final   Special Requests BOTTLES DRAWN  AEROBIC AND ANAEROBIC [facility]   Final   Culture  Setup Time     Final   Value: 12/13/2013 08:47     Performed at [facility]   Culture     Final   Value:        BLOOD CULTURE RECEIVED NO GROWTH TO DATE CULTURE WILL BE HELD FOR 5 DAYS BEFORE ISSUING A FINAL NEGATIVE REPORT     Performed at [facility]   Report Status PENDING   Incomplete  CULTURE, BLOOD (ROUTINE X 2)     Status: None   Collection Time    12/13/13  5:09 AM      Result Value Ref Range Status   Specimen Description BLOOD LEFT FOREARM   Final   Special Requests BOTTLES DRAWN AEROBIC AND ANAEROBIC 6CC   Final   Culture  Setup Time     Final   Value: 12/13/2013 08:47     Performed at [facility]   Culture     Final   Value:        BLOOD CULTURE RECEIVED NO GROWTH TO DATE CULTURE WILL BE HELD FOR 5 DAYS BEFORE ISSUING A FINAL NEGATIVE REPORT     Performed at [facility]   Report Status PENDING   Incomplete  URINE CULTURE     Status: None   Collection Time    12/13/13  9:18 AM      Result Value Ref Range Status   Specimen Description URINE, CATHETERIZED   Final   Special Requests Normal   Final   Culture  Setup Time     Final   Value: 12/13/2013 12:33     Performed at [facility] Count     Final   Value: NO GROWTH     Performed at [facility]   Culture     Final   Value: NO GROWTH     Performed at [facility]   Report Status 12/14/2013 FINAL   Final  MRSA PCR SCREENING     Status: Abnormal   Collection Time    12/13/13  9:18 AM      Result Value Ref Range Status   MRSA by PCR POSITIVE (*) NEGATIVE Final   Comment:            The GeneXpert MRSA Assay (FDA     approved for NASAL specimens     only), is one component of a     comprehensive MRSA colonization     surveillance program. It is not  intended to diagnose MRSA     infection nor to guide or     monitor treatment for     MRSA infections.     RESULT CALLED TO, READ BACK BY AND  VERIFIED WITH:     K. STRUP RN AT 1055 ON 08.20.15 BY SHUEA    Coagulation Studies: No results found for this basename: LABPROT, INR,  in the last 72 hours  Imaging: Dg Chest Port 1 View  12/17/2013   CLINICAL DATA:  Hypoxia.  EXAM: PORTABLE CHEST - 1 VIEW  COMPARISON:  12/13/2013.  FINDINGS: There is less patient rotation to the right with no gross change in a normal sized heart. Decreased inspiration with diffusely increased prominence of the interstitial markings. Evidence of a small right pleural effusion. Diffuse osteopenia.  IMPRESSION: 1. Decreased inspiration with increased crowding of the interstitial markings and possibly increased interstitial pneumonitis or pulmonary edema. 2. Probable small right pleural effusion.   Electronically Signed   By: Gordan Payment M.D.   On: 12/17/2013 01:05    Medications:  Scheduled: . antiseptic oral rinse  7 mL Mouth Rinse q12n4p  . chlorhexidine  15 mL Mouth Rinse BID  . Chlorhexidine Gluconate Cloth  6 each Topical Q0600  . docusate  100 mg Per Tube BID  . feeding supplement (JEVITY 1.5 CAL/FIBER)  1,000 mL Per Tube Q24H  . mupirocin ointment  1 application Nasal BID  . pantoprazole sodium  40 mg Per Tube Q24H  . risperiDONE  0.5 mg Oral BID  . sennosides  5 mL Per Tube BID  . sodium chloride  3 mL Intravenous Q12H  . valproate sodium  500 mg Intravenous Q12H    Assessment/Plan:  43 year old man with history of traumatic brain injury as well as history seizure disorder, with altered mental status with findings indicative of acute delirium, most likely associated with medications or possibly medication withdrawal. Patient remains on Depakote with no further seizures.  EEG and head CTwas not obtained due to agitation.  Patient is back on Respirdone .5 mg BID.     OF NOTE: He is not on Keppra and has not been on this medication during this admission.    At this time no further recommendations.  May consider EEG and Head CT if able.    No  further neurologic intervention is recommended at this time.  If further questions arise, please call or page at that time.  Thank you for allowing neurology to participate in the care of this patient.  Felicie Morn PA-C [facility] [phone]  12/17/2013, 9:48 AM

## 2013-12-18 ENCOUNTER — Encounter: Payer: Medicaid Other | Admitting: Speech Pathology

## 2013-12-18 ENCOUNTER — Inpatient Hospital Stay (HOSPITAL_COMMUNITY): Payer: Medicaid Other

## 2013-12-18 ENCOUNTER — Encounter: Payer: Medicaid Other | Admitting: Occupational Therapy

## 2013-12-18 ENCOUNTER — Ambulatory Visit: Payer: Medicaid Other | Admitting: Physical Therapy

## 2013-12-18 LAB — BLOOD GAS, ARTERIAL
ACID-BASE EXCESS: 1.7 mmol/L (ref 0.0–2.0)
Bicarbonate: 26.2 mEq/L — ABNORMAL HIGH (ref 20.0–24.0)
DRAWN BY: 331471
FIO2: 1 %
O2 SAT: 91.2 %
Patient temperature: 98.6
TCO2: 23.9 mmol/L (ref 0–100)
pCO2 arterial: 43.5 mmHg (ref 35.0–45.0)
pH, Arterial: 7.398 (ref 7.350–7.450)
pO2, Arterial: 65.7 mmHg — ABNORMAL LOW (ref 80.0–100.0)

## 2013-12-18 LAB — CBC
HEMATOCRIT: 33.2 % — AB (ref 39.0–52.0)
Hemoglobin: 10.8 g/dL — ABNORMAL LOW (ref 13.0–17.0)
MCH: 27.6 pg (ref 26.0–34.0)
MCHC: 32.5 g/dL (ref 30.0–36.0)
MCV: 84.9 fL (ref 78.0–100.0)
Platelets: 110 10*3/uL — ABNORMAL LOW (ref 150–400)
RBC: 3.91 MIL/uL — AB (ref 4.22–5.81)
RDW: 15.9 % — AB (ref 11.5–15.5)
WBC: 9.4 10*3/uL (ref 4.0–10.5)

## 2013-12-18 LAB — BASIC METABOLIC PANEL
ANION GAP: 11 (ref 5–15)
BUN: 7 mg/dL (ref 6–23)
CO2: 27 mEq/L (ref 19–32)
Calcium: 9.1 mg/dL (ref 8.4–10.5)
Chloride: 103 mEq/L (ref 96–112)
Creatinine, Ser: 0.52 mg/dL (ref 0.50–1.35)
Glucose, Bld: 160 mg/dL — ABNORMAL HIGH (ref 70–99)
POTASSIUM: 3.3 meq/L — AB (ref 3.7–5.3)
Sodium: 141 mEq/L (ref 137–147)

## 2013-12-18 LAB — PHOSPHORUS: PHOSPHORUS: 2.4 mg/dL (ref 2.3–4.6)

## 2013-12-18 LAB — MAGNESIUM: Magnesium: 1.9 mg/dL (ref 1.5–2.5)

## 2013-12-18 MED ORDER — ENOXAPARIN SODIUM 60 MG/0.6ML ~~LOC~~ SOLN
50.0000 mg | Freq: Two times a day (BID) | SUBCUTANEOUS | Status: DC
  Administered 2013-12-18 – 2013-12-24 (×13): 50 mg via SUBCUTANEOUS
  Filled 2013-12-18 (×15): qty 0.6

## 2013-12-18 MED ORDER — VALPROIC ACID 250 MG/5ML PO SYRP
500.0000 mg | ORAL_SOLUTION | Freq: Two times a day (BID) | ORAL | Status: DC
  Administered 2013-12-18 – 2014-01-03 (×33): 500 mg
  Filled 2013-12-18 (×34): qty 10

## 2013-12-18 MED ORDER — RISPERIDONE 1 MG/ML PO SOLN
1.0000 mg | Freq: Three times a day (TID) | ORAL | Status: DC
  Administered 2013-12-18 – 2013-12-22 (×12): 1 mg
  Filled 2013-12-18 (×15): qty 1

## 2013-12-18 MED ORDER — CLONAZEPAM 1 MG PO TABS
1.0000 mg | ORAL_TABLET | Freq: Three times a day (TID) | ORAL | Status: DC
  Administered 2013-12-18 – 2013-12-19 (×3): 1 mg
  Filled 2013-12-18 (×3): qty 1

## 2013-12-18 MED ORDER — POTASSIUM CHLORIDE 20 MEQ/15ML (10%) PO LIQD
40.0000 meq | Freq: Once | ORAL | Status: AC
  Administered 2013-12-18: 40 meq
  Filled 2013-12-18: qty 30

## 2013-12-18 MED ORDER — SODIUM CHLORIDE 0.9 % IV SOLN
INTRAVENOUS | Status: DC
  Administered 2013-12-18 – 2013-12-21 (×3): via INTRAVENOUS
  Administered 2013-12-29: 1000 mL via INTRAVENOUS
  Administered 2013-12-31: 16:00:00 via INTRAVENOUS

## 2013-12-18 MED ORDER — FUROSEMIDE 10 MG/ML IJ SOLN
20.0000 mg | Freq: Once | INTRAMUSCULAR | Status: AC
  Administered 2013-12-18: 20 mg via INTRAVENOUS
  Filled 2013-12-18: qty 2

## 2013-12-18 NOTE — Progress Notes (Signed)
ANTICOAGULATION CONSULT NOTE - Initial Consult  Pharmacy Consult for Lovenox Indication: Hx DVT  Allergies  Allergen Reactions  . Ativan [Lorazepam]     Pt has paradoxical effect with sedating medications.     Patient Measurements: Height:  (170.2 cm) Weight: 114 lb 10.2 oz (52 kg) IBW/kg (Calculated) : 66.1  Vital Signs: Temp: 97.9 F (36.6 C) (08/25 0750) Temp src: Axillary (08/25 0750) BP: 95/53 mmHg (08/25 0800) Pulse Rate: 90 (08/25 0800)  Labs:  Recent Labs  12/16/13 0400 12/17/13 0343 12/18/13 0337  HGB 10.2* 10.1* 10.8*  HCT 31.1* 31.0* 33.2*  PLT 125* 114* 110*  CREATININE 0.64 0.50 0.52  CKTOTAL 215  --   --     Estimated Creatinine Clearance: 85.8 ml/min (by C-G formula based on Cr of 0.52).   Medical History: Past Medical History  Diagnosis Date  . 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 Seizure   . DVT (deep venous thrombosis)     UE, LE DVT during hospitalization  . H/O renal calculi     Medications:  Scheduled:  . antiseptic oral rinse  7 mL Mouth Rinse q12n4p  . chlorhexidine  15 mL Mouth Rinse BID  . Chlorhexidine Gluconate Cloth  6 each Topical Q0600  . clonazePAM  1 mg Per Tube Q8H  . docusate  100 mg Per Tube BID  . feeding supplement (JEVITY 1.5 CAL/FIBER)  1,000 mL Per Tube Q24H  . free water  200 mL Per Tube Q6H  . mupirocin ointment  1 application Nasal BID  . nicotine  14 mg Transdermal Daily  . pantoprazole sodium  40 mg Per Tube Q24H  . potassium chloride  40 mEq Per Tube Once  . risperiDONE  1 mg Per Tube 3 times per day  . sennosides  5 mL Per Tube BID  . sodium chloride  3 mL Intravenous Q12H  . Valproic Acid  500 mg Per Tube Q12H   Infusions:  . sodium chloride    . dexmedetomidine 1.2 mcg/kg/hr (12/18/13 0702)   PRN: acetaminophen (TYLENOL) oral liquid 160 mg/5 mL, fentaNYL, haloperidol lactate, ondansetron (ZOFRAN) IV, polyethylene glycol, sodium  phosphate  Assessment: 43 yoM h/o traumatic brain injury, seizures. Admitted for AMS, agitation on 8/20. On Xarelto  daily for recent DVT/PE which has been on hold since admission given hx subdural hematoma and inability to get head CT. Pharmacy consulted 8/25 to begin lovenox until able to resume xarelto (pending head CT when patient less agitated).   Current weight 52 kg  SCr stable at 0.52, CrCl 85 ml/min  H/H low but stable, Plts low and decreased from admission  Goal of Therapy:  Anti-Xa level 0.6-1 units/ml 4hrs after LMWH dose given Monitor platelets by anticoagulation protocol: Yes - note low at baseline   Plan:   Begin Lovenox  ( /kg) SQ q12h  Monitor renal function and adjust as needed  Monitor CBC and signs/symptoms of bleeding closely given thrombocytopenia  Loralee Pacas, PharmD, BCPS Pager: [phone] 12/18/2013,8:14 AM

## 2013-12-18 NOTE — Progress Notes (Signed)
PT Cancellation Note  Patient Details Name: Hector [name] MRN: [id number] DOB: 07-13-1970   Cancelled Treatment:    Reason Eval/Treat Not Completed: Medical issues which prohibited therapycheck back tomorrow.   [name] 12/18/2013, 4:11 PM Blanchard Kelch PT [phone]

## 2013-12-18 NOTE — Progress Notes (Addendum)
PULMONARY / CRITICAL CARE MEDICINE   Name: Hector [name] MRN: [id number] DOB: Sep 06, 1970    ADMISSION DATE:  12/11/2013 CONSULTATION DATE:  12/13/13  REFERRING MD :  Dr. Izola Price / [facility]   CHIEF COMPLAINT:  Seizure, concern for airway protection   INITIAL PRESENTATION:  43 yo male presented to ER for involuntary commitment.  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  He was noted to having jerking movement with ? Seizure.  PCCM asked to assess with concern for airway protection/aspiration.  STUDIES:  8/17  Renal US >> normal renal US 8/17  CTA Chest >> no acute disease, mild centrilobular emphysema 8/19  CT ABD/Pelvis >> moderate amt stool in large bowel, G-tube, no acute process, bilateral non-obs up to 3mm stones  SIGNIFICANT EVENTS: 8/17 Seen in ER with Rt flank pain, urine retention  8/18 Seen in ER for agitation, involuntary commitment papers placed 8/20 Admitted to SDU after shaking / agitation, concerns for seizures & ? R airspace disease 8/21 Improved agitation, brady with precedex  SUBJECTIVE:  Intermittently agitated.  Received haldol overnight.  Remains on precedex.  VITAL SIGNS: Temp:  [98.1 F (36.7 C)-99.4 F (37.4 C)] 99.1 F (37.3 C) (08/25 0400) Pulse Rate:  [45-143] 98 (08/25 0732) Resp:  [14-40] 29 (08/25 0732) BP: (85-170)/(43-79) 117/53 mmHg (08/25 0732) SpO2:  [92 %-100 %] 99 % (08/25 0732) FiO2 (%):  [55 %] 55 % (08/25 0000) Weight:  [114 lb 10.2 oz (52 kg)] 114 lb 10.2 oz (52 kg) (08/25 0400)  INTAKE / OUTPUT:  Intake/Output Summary (Last 24 hours) at 12/18/13 0748 Last data filed at 12/18/13 0736  Gross per 24 hour  Intake 3038.94 ml  Output   4125 ml  Net -1086.06 ml    PHYSICAL EXAMINATION: General: no distress Neuro: RASS -2 HEENT: no sinus tenderness  Cardiovascular: regular, tachycardic Lungs: no wheeze Abdomen: soft, non tender, PEG site clean Musculoskeletal: no edema Skin: no rashes  LABS:  CBC  Recent Labs Lab  12/16/13 0400 12/17/13 0343 12/18/13 0337  WBC 5.6 8.1 9.4  HGB 10.2* 10.1* 10.8*  HCT 31.1* 31.0* 33.2*  PLT 125* 114* 110*   BMET  Recent Labs Lab 12/16/13 0400 12/17/13 0343 12/18/13 0337  NA 148* 140 141  K 3.2* 3.6* 3.3*  CL 110 102 103  CO2 BUN 9 4* 7  CREATININE 0.64 0.50 0.52  GLUCOSE 111* 149* 160*   Electrolytes  Recent Labs Lab 12/14/13 0340  12/16/13 0400 12/17/13 0343 12/18/13 0337  CALCIUM 9.5  < > 9.0 8.8 9.1  MG 2.0  --   --   --  1.9  PHOS 3.3  --   --   --  2.4  < > = values in this interval not displayed. Sepsis Markers  Recent Labs Lab 12/13/13 0446 12/13/13 0456 12/15/13 0407 12/17/13 0343  LATICACIDVEN  --  2.38*  --   --   PROCALCITON 0.38  --  0.12 0.21   ABG  Recent Labs Lab 12/13/13 0912  PHART 7.418  PCO2ART 31.5*  PO2ART 70.5*   Liver Enzymes  Recent Labs Lab 12/11/13 2217 12/13/13 0446 12/14/13 0340  AST 41* 48* 43*  ALT 35 33 28  ALKPHOS 236* 243* 199*  BILITOT 0.4 0.8 0.6  ALBUMIN 3.7 3.9 2.9*   Cardiac Enzymes  Recent Labs Lab 12/13/13 1114  TROPONINI <0.30  PROBNP 729.4*   Imaging No results found.   ASSESSMENT / PLAN:  NEUROLOGY Severe agitated delirium. 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 Expressive aphasia. Hx of ETOH. P:   RASS goal: -1 Wean off precedex as tolerated Hold home regimen:  Amantadine, bromocriptine, remeron Continue valproate Increase klonopin, risperdal 8/25 Prn haldol, fentanyl F/u CT head when stable to have procedure done Might need psych to re-assess >> seen on day of admission, but not since  PULMONARY Hx of tobacco abuse. Mild emphysema changes noted on CT chest 12/10/13. P: Monitor oxygenation Add nicotine patch  CARDIOLOGY Sinus tachycardia in setting of acute agitated delirium. Hx DVT - upper & lower extremity DVT during admission in 07/2013, on xarelto at baseline. P:  Monitor  hemodynamics Will have pharmacy start full dose lovenox in place of xarelto for now  RENAL Mild Rhabdo in setting of agitation >> resolved. Hypernatremia >> resolved. Hypokalemia. P:   Continue ree water KVO IV fluids  GASTROINTESTINAL Dysphagia s/p PEG. Protein calorie malnutrition. P:   Continue tube feeds Protonix for SUP  HEMATOLOGIC Anemia of chronic disease. Mild thrombocytopenia. P:  Monitor CBC  INFECTIOUS A:   No evidence for infection. P:   Monitor clinically off Abx  Blood cx 8/20 >>   ENDOCRINE A:   No acute issues. P:   Monitor blood sugar on BMET  TODAY'S SUMMARY: Persistent agitation.  Continue precedex.  Increase klonopin, risperdal.  CC time 35 minutes.  Coralyn Helling, MD [facility] Pulmonary/Critical Care 12/18/2013, 7:48 AM Pager:  [phone] After 3pm call: [phone]   Spoke with pt's mother over the phone.  Gave her update about plan for sedative medicines.  Coralyn Helling, MD [facility] Pulmonary/Critical Care 12/18/2013, 10:18 AM Pager:  [phone] After 3pm call: [phone]

## 2013-12-18 NOTE — Progress Notes (Signed)
Rt NT suction pt and obtained ABG. Pt had no adverse reaction to therapy. Pt had moderate amount of white thick sputum.

## 2013-12-19 ENCOUNTER — Inpatient Hospital Stay (HOSPITAL_COMMUNITY): Payer: Medicaid Other

## 2013-12-19 LAB — BASIC METABOLIC PANEL
ANION GAP: 13 (ref 5–15)
BUN: 12 mg/dL (ref 6–23)
CALCIUM: 9.2 mg/dL (ref 8.4–10.5)
CO2: 27 mEq/L (ref 19–32)
Chloride: 102 mEq/L (ref 96–112)
Creatinine, Ser: 0.58 mg/dL (ref 0.50–1.35)
GFR calc non Af Amer: >90 mL/min (ref >90)
Glucose, Bld: 184 mg/dL — ABNORMAL HIGH (ref 70–99)
Potassium: 3.6 mEq/L — ABNORMAL LOW (ref 3.7–5.3)
Sodium: 142 mEq/L (ref 137–147)

## 2013-12-19 LAB — BLOOD GAS, ARTERIAL
ACID-BASE EXCESS: 5.2 mmol/L — AB (ref 0.0–2.0)
BICARBONATE: 29.2 meq/L — AB (ref 20.0–24.0)
Drawn by: 11249
FIO2: 1 %
O2 Saturation: 97.5 %
PO2 ART: 99.2 mmHg (ref 80.0–100.0)
Patient temperature: 100.1
TCO2: 26.6 mmol/L (ref 0–100)
pCO2 arterial: 43.9 mmHg (ref 35.0–45.0)
pH, Arterial: 7.442 (ref 7.350–7.450)

## 2013-12-19 LAB — CBC
HCT: 33.7 % — ABNORMAL LOW (ref 39.0–52.0)
Hemoglobin: 10.5 g/dL — ABNORMAL LOW (ref 13.0–17.0)
MCH: 26.9 pg (ref 26.0–34.0)
MCHC: 31.2 g/dL (ref 30.0–36.0)
MCV: 86.2 fL (ref 78.0–100.0)
Platelets: 121 10*3/uL — ABNORMAL LOW (ref 150–400)
RBC: 3.91 MIL/uL — ABNORMAL LOW (ref 4.22–5.81)
RDW: 16.1 % — ABNORMAL HIGH (ref 11.5–15.5)
WBC: 13 10*3/uL — ABNORMAL HIGH (ref 4.0–10.5)

## 2013-12-19 LAB — MAGNESIUM: Magnesium: 1.8 mg/dL (ref 1.5–2.5)

## 2013-12-19 LAB — CULTURE, BLOOD (ROUTINE X 2)
CULTURE: NO GROWTH
Culture: NO GROWTH

## 2013-12-19 MED ORDER — MAGNESIUM SULFATE 40 MG/ML IJ SOLN
2.0000 g | Freq: Once | INTRAMUSCULAR | Status: AC
  Administered 2013-12-19: 2 g via INTRAVENOUS
  Filled 2013-12-19: qty 50

## 2013-12-19 MED ORDER — SODIUM CHLORIDE 0.9 % IV BOLUS (SEPSIS)
500.0000 mL | Freq: Once | INTRAVENOUS | Status: AC
  Administered 2013-12-19: 500 mL via INTRAVENOUS

## 2013-12-19 MED ORDER — POTASSIUM CHLORIDE 20 MEQ/15ML (10%) PO LIQD
20.0000 meq | ORAL | Status: AC
  Administered 2013-12-19 (×2): 20 meq
  Filled 2013-12-19 (×2): qty 15

## 2013-12-19 MED ORDER — SODIUM CHLORIDE 0.9 % IV BOLUS (SEPSIS)
1000.0000 mL | Freq: Once | INTRAVENOUS | Status: AC
  Administered 2013-12-19: 1000 mL via INTRAVENOUS

## 2013-12-19 MED ORDER — CLONAZEPAM 1 MG PO TABS
1.0000 mg | ORAL_TABLET | Freq: Two times a day (BID) | ORAL | Status: DC
  Administered 2013-12-19: 1 mg
  Filled 2013-12-19: qty 1

## 2013-12-19 MED ORDER — DEXTROSE 5 % IV SOLN
2.0000 g | Freq: Three times a day (TID) | INTRAVENOUS | Status: DC
  Administered 2013-12-19 – 2013-12-26 (×22): 2 g via INTRAVENOUS
  Filled 2013-12-19 (×22): qty 2

## 2013-12-19 NOTE — Progress Notes (Signed)
PULMONARY / CRITICAL CARE MEDICINE   Name: Hector [name] MRN: [id number] DOB: [birth year]    ADMISSION DATE:  12/11/2013 CONSULTATION DATE:  12/13/13  REFERRING MD :  Dr. Izola Price / [facility]   CHIEF COMPLAINT:  Seizure, concern for airway protection   INITIAL PRESENTATION:  43 yo male presented to ER for involuntary commitment.  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  He was noted to having jerking movement with ? Seizure.  PCCM asked to assess with concern for airway protection/aspiration.  STUDIES:  8/17  Renal US >> normal renal US 8/17  CTA Chest >> no acute disease, mild centrilobular emphysema 8/19  CT ABD/Pelvis >> moderate amt stool in large bowel, G-tube, no acute process, bilateral non-obs up to 3mm stones  SIGNIFICANT EVENTS: 8/17 Seen in ER with Rt flank pain, urine retention  8/18 Seen in ER for agitation, involuntary commitment papers placed 8/20 Admitted to SDU after shaking / agitation, concerns for seizures & ? R airspace disease 8/21 Improved agitation, brady with precedex 8/26 Improved agitation, sedation, mild hypotension   SUBJECTIVE: RN reports hypotension overnight requiring 1L bolus, sedate.  No agitation.    VITAL SIGNS: Temp:  [98.6 F (37 C)-99.3 F (37.4 C)] 99.1 F (37.3 C) (08/26 0410) Pulse Rate:  [82-139] 111 (08/26 0700) Resp:  [28-59] 57 (08/26 0700) BP: (81-125)/(43-73) 102/44 mmHg (08/26 0700) SpO2:  [62 %-100 %] 91 % (08/26 0700) FiO2 (%):  [100 %] 100 % (08/26 0300)  INTAKE / OUTPUT:  Intake/Output Summary (Last 24 hours) at 12/19/13 0813 Last data filed at 12/19/13 0865  Gross per 24 hour  Intake 2860.21 ml  Output   2625 ml  Net 235.21 ml    PHYSICAL EXAMINATION: General: thin adult male in no acute distress Neuro: RASS -2 HEENT: no sinus tenderness  Cardiovascular: regular, tachycardic Lungs: resp's even/non-labored, lungs bilaterally with few scattered rhonchi wheeze Abdomen: soft, non tender, PEG site  clean Musculoskeletal: no edema Skin: no rashes  LABS:  CBC  Recent Labs Lab 12/17/13 0343 12/18/13 0337 12/19/13 0350  WBC 8.1 9.4 13.0*  HGB 10.1* 10.8* 10.5*  HCT 31.0* 33.2* 33.7*  PLT 114* 110* 121*   BMET  Recent Labs Lab 12/17/13 0343 12/18/13 0337 12/19/13 0350  NA 140 141 142  K 3.6* 3.3* 3.6*  CL 102 103 102  CO2 BUN 4* 7 12  CREATININE 0.50 0.52 0.58  GLUCOSE 149* 160* 184*   Electrolytes  Recent Labs Lab 12/14/13 0340  12/17/13 0343 12/18/13 0337 12/19/13 0350  CALCIUM 9.5  < > 8.8 9.1 9.2  MG 2.0  --   --  1.9 1.8  PHOS 3.3  --   --  2.4  --   < > = values in this interval not displayed. Sepsis Markers  Recent Labs Lab 12/13/13 0446 12/13/13 0456 12/15/13 0407 12/17/13 0343  LATICACIDVEN  --  2.38*  --   --   PROCALCITON 0.38  --  0.12 0.21   ABG  Recent Labs Lab 12/13/13 0912 12/18/13 1748 12/19/13 0359  PHART 7.418 7.398 7.442  PCO2ART 31.5* 43.5 43.9  PO2ART 70.5* 65.7* 99.2   Liver Enzymes  Recent Labs Lab 12/13/13 0446 12/14/13 0340  AST 48* 43*  ALT 33 28  ALKPHOS 243* 199*  BILITOT 0.8 0.6  ALBUMIN 3.9 2.9*   Cardiac Enzymes  Recent Labs Lab 12/13/13 1114  TROPONINI <0.30  PROBNP 729.4*   Imaging Dg Chest [facility]  1 View  12/18/2013   CLINICAL DATA:  Shortness of breath and wheezing.  EXAM: PORTABLE CHEST - 1 VIEW  COMPARISON:  12/16/2013 and 12/13/2013  FINDINGS: Lungs are adequately inflated with continued diffuse bilateral interstitial prominence. There has been interval improved aeration of the previously noted right middle lobe collapse with more confluent airspace density over the region of the right middle lobe likely residual atelectasis, although cannot exclude infection. No definite effusions. Cardiomediastinal silhouette and remainder of the exam is unchanged.  IMPRESSION: Interval improved aeration of the right middle lobe with persistent airspace density over the right middle lobe which may  present residual atelectasis versus infection. Continued mild bilateral interstitial prominence which may be due to interstitial pneumonitis or edema.   Electronically Signed   By: Elberta Fortis M.D.   On: 12/18/2013 18:17     ASSESSMENT / PLAN:  NEUROLOGY Severe agitated delirium. 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 Expressive aphasia. Hx of ETOH. P:   RASS goal: -1 D/C Precedex 8/26 Hold home regimen:  Amantadine, bromocriptine, remeron Continue valproate Continue Risperdal  TID  Reduce klonopin to  BID 8/26 and monitor effect PRN haldol, fentanyl F/u CT head when stable to have procedure done Might need psych to re-assess >> seen on day of admission, but not since  PULMONARY Hx of tobacco abuse. Mild emphysema changes noted on CT chest 12/10/13. P: Monitor oxygenation Add nicotine patch F/u CXR PRN NTS  CARDIOLOGY Sinus tachycardia in setting of acute agitated delirium. Hx DVT - upper & lower extremity DVT during admission in 07/2013, on xarelto at baseline. P:  Monitor hemodynamics Will have pharmacy start full dose lovenox in place of xarelto for now 1L NS bolus x one 8/26  RENAL Mild Rhabdo in setting of agitation >> resolved. Hypernatremia >> resolved. Hypokalemia. P:   Continue free water  GASTROINTESTINAL Dysphagia s/p PEG. Protein calorie malnutrition. P:   Continue tube feeds Protonix for SUP  HEMATOLOGIC Anemia of chronic disease. Mild thrombocytopenia. P:  Monitor CBC  INFECTIOUS Increased sputum production and progressive infiltrate on CXR 8/26. P:   Add fortaz 8/26  Sputum 8/26 >> Blood 8/26 >>   ENDOCRINE No acute issues. P:   Monitor blood sugar on BMET  TODAY'S SUMMARY:  Agitation improved, now sedate.  Off precedex, reduce klonopin to BID and follow response  Persistent agitation.  Continue precedex.  Increase klonopin, risperdal.  Canary Brim, NP-C [place]  Pulmonary & Critical Care Pgr: [phone] or [phone]  Reviewed above, examined.  Off precedex >> will try to titrate down enteral sedative medications.  Has hypotension ? From sedating meds >> give fluid bolus and monitor.  Has increased respiratory secretions and progressive infiltrates on CXR >> add fortaz 8/26 for HCAP coverage, and check sputum/blood cx's.  Updated family at bedside about plan.  CC time 35 minutes.  Coralyn Helling, MD [facility] Pulmonary/Critical Care 12/19/2013, 9:14 AM Pager:  [phone] After 3pm call: [phone]

## 2013-12-19 NOTE — Progress Notes (Signed)
After multiple interventions, patient continues to be tachycardic, tachepnic, and appearing in acute distress. Will page MD Craige Cotta about patient's current status.

## 2013-12-19 NOTE — Progress Notes (Signed)
S:  Called to bedside for nursing concerns of respiratory distress.   O:  Filed Vitals:   12/19/13 0800 12/19/13 0900 12/19/13 1000 12/19/13 1100  BP: 88/45 140/80 114/48 133/60  Pulse: 97 115 99 114  Temp:      TempSrc:      Resp: 33 48 34 53  Height:      Weight:      SpO2: 100% 97% 100% 100%    Recent Labs Lab 12/13/13 0912 12/18/13 1748 12/19/13 0359  PHART 7.418 7.398 7.442  PCO2ART 31.5* 43.5 43.9  PO2ART 70.5* 65.7* 99.2  HCO3 20.0 26.2* 29.2*  TCO2 18.2 23.9 26.6  O2SAT 92.6 91.2 97.5   EXAM: Patient lying in bed, sedate.  Tachypnea in 30's noted.  Patient moved up in bed, and placed in chair position.  NTS and oral suctioning x 2 with good patient cough and mod-large amount of creamy secretions return. Patients mental status is improved from am assessment - actually voicing words after stimulation.  Lungs bilaterally with rhonchi.  Post suctioning, improved breath sounds & upper airway noise.  Oral care performed / teeth brushed.    A: Tachypnea  Respiratory Distress Tachycardia   P: NTS PRN  Upright positioning, mobilize as able Oxygen to support saturations Continue current PSY / Pain regimen, re-eval in am for further reduction of medications.   F/U CXR in am  At risk for intubation if we do not stay on top of secretions / pulmonary hygiene  Canary Brim, NP-C [place] Pulmonary & Critical Care Pgr: [phone] or [phone]

## 2013-12-19 NOTE — Progress Notes (Signed)
CSW continuing to follow.  CSW reviewed chart and noted that pt has had concerns of respiratory distress today, currently on non-rebreather mask.  CSW continuing to follow to assist with disposition once pt becomes more medically appropriate to initiate search.   CSW to continue to follow.  Loletta Specter, MSW, LCSW Clinical Social Work [phone]

## 2013-12-19 NOTE — Progress Notes (Signed)
ANTIBIOTIC CONSULT NOTE - INITIAL  Pharmacy Consult for Ceftazidime Indication: HCAP  Allergies  Allergen Reactions  . Ativan [Lorazepam]     Pt has paradoxical effect with sedating medications.     Patient Measurements: Height:  (170.2 cm) Weight: 114 lb 10.2 oz (52 kg) IBW/kg (Calculated) : 66.1  Vital Signs: Temp: 99.1 F (37.3 C) (08/26 0410) Temp src: Axillary (08/26 0410) BP: 140/80 mmHg (08/26 0900) Pulse Rate: 115 (08/26 0900) Intake/Output from previous day: 08/25 0701 - 08/26 0700 In: 3071.2 [I.V.:671.2; NG/GT:1350; IV Piggyback:1050] Out: 3000 [Urine:2925; Drains:75]  Labs:  Recent Labs  12/17/13 0343 12/18/13 0337 12/19/13 0350  WBC 8.1 9.4 13.0*  HGB 10.1* 10.8* 10.5*  PLT 114* 110* 121*  CREATININE 0.50 0.52 0.58   Estimated Creatinine Clearance: 85.8 ml/min (by C-G formula based on Cr of 0.58).   Medical History: Past Medical History  Diagnosis Date  . 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 Seizure   . DVT (deep venous thrombosis)     UE, LE DVT during hospitalization  . H/O renal calculi     Assessment: 89 yoM presented on 8/17 with h/o traumatic brain injury, seizures, in ED x 3 days awaiting placement. Became febrile and hypotensive with possible seizure activity and r/o pneumonia.  Received one time doses of Zosyn and Vanc for sepsis/HCAP on 8/20.  Pharmacy is now consulted to dose ceftazidime for suspected HCAP in the setting of increased sputum production and progressive infiltrate on CXR.   8/20 >> Zosyn >> 8/20 8/20 >> Vanc >> 8/20 8/26 >> Ceftazidime >>  Tmax: afebrile WBCs: increased to 13 Renal: SCr 0.58 with CrCl ~ 86 ml/min  8/20 MRSA PCR: positive 8/20 blood x2: NGF 8/20 urine: NGF 8/26 sputum: sent 8/26 blood x2: sent   Goal of Therapy:  Appropriate abx dosing, eradication of infection.   Plan:   Ceftazidime 2g IV q8h.  Follow up renal fxn and culture  results.   Lynann Beaver PharmD, BCPS Pager [phone] 12/19/2013 10:06 AM

## 2013-12-19 NOTE — Progress Notes (Signed)
[facility] ADULT ICU REPLACEMENT PROTOCOL FOR AM LAB REPLACEMENT ONLY  The patient does apply for the [facility] Adult ICU Electrolyte Replacment Protocol based on the criteria listed below:   1. Is GFR >/= 40 ml/min? Yes.    Patient's GFR today is >90 2. Is urine output >/= 0.5 ml/kg/hr for the last 6 hours? Yes.   Patient's UOP is 2.6 ml/kg/hr 3. Is BUN < 60 mg/dL? Yes.    Patient's BUN today is 12 4. Abnormal electrolyte(s)K3.6,Mg1.8 5. Ordered repletion with: K-[id number]/liquid; Mg2g/IV 6. If a panic level lab has been reported, has the CCM MD in charge been notified? Yes.  .   Physician:  Orlean Bradford MD  Melrose Nakayama 12/19/2013 5:01 AM

## 2013-12-19 NOTE — Progress Notes (Signed)
Physical Therapy Discharge Patient Details Name: FLEMON KELTY MRN: [id number] DOB: 04/17/1971 Today's Date: 12/19/2013 Time:  - 14:22    Patient discharged from PT services secondary to medical  Status, currently on NRB mask. Please reorder PT when patient is medically ready. Thank You.   GP     Sharen Heck PT [phone]  12/19/2013, 2:20 PM

## 2013-12-19 NOTE — Progress Notes (Signed)
On initial assessment patient's BP soft 81/29 with MAP 45. Text-paged MD Izola Price to make aware. Will continue to monitor.

## 2013-12-20 ENCOUNTER — Inpatient Hospital Stay (HOSPITAL_COMMUNITY): Payer: Medicaid Other

## 2013-12-20 ENCOUNTER — Encounter: Payer: Medicaid Other | Admitting: Speech Pathology

## 2013-12-20 DIAGNOSIS — J9600 Acute respiratory failure, unspecified whether with hypoxia or hypercapnia: Secondary | ICD-10-CM

## 2013-12-20 DIAGNOSIS — J9601 Acute respiratory failure with hypoxia: Secondary | ICD-10-CM

## 2013-12-20 LAB — BASIC METABOLIC PANEL
ANION GAP: 10 (ref 5–15)
BUN: 10 mg/dL (ref 6–23)
CHLORIDE: 106 meq/L (ref 96–112)
CO2: 26 mEq/L (ref 19–32)
Calcium: 9 mg/dL (ref 8.4–10.5)
Creatinine, Ser: 0.59 mg/dL (ref 0.50–1.35)
GFR calc Af Amer: >90 mL/min (ref >90)
GFR calc non Af Amer: >90 mL/min (ref >90)
Glucose, Bld: 156 mg/dL — ABNORMAL HIGH (ref 70–99)
POTASSIUM: 3.5 meq/L — AB (ref 3.7–5.3)
Sodium: 142 mEq/L (ref 137–147)

## 2013-12-20 LAB — CBC
HEMATOCRIT: 31.8 % — AB (ref 39.0–52.0)
HEMOGLOBIN: 9.8 g/dL — AB (ref 13.0–17.0)
MCH: 27 pg (ref 26.0–34.0)
MCHC: 30.8 g/dL (ref 30.0–36.0)
MCV: 87.6 fL (ref 78.0–100.0)
Platelets: 124 10*3/uL — ABNORMAL LOW (ref 150–400)
RBC: 3.63 MIL/uL — AB (ref 4.22–5.81)
RDW: 16.5 % — ABNORMAL HIGH (ref 11.5–15.5)
WBC: 6.9 10*3/uL (ref 4.0–10.5)

## 2013-12-20 LAB — BLOOD GAS, ARTERIAL
Acid-Base Excess: 3.3 mmol/L — ABNORMAL HIGH (ref 0.0–2.0)
Bicarbonate: 26.9 mEq/L — ABNORMAL HIGH (ref 20.0–24.0)
Drawn by: 41308
FIO2: 100 %
O2 Saturation: 99.5 %
PATIENT TEMPERATURE: 98.6
PEEP/CPAP: 5 cmH2O
RATE: 14 resp/min
TCO2: 24.5 mmol/L (ref 0–100)
VT: 530 mL
pCO2 arterial: 38.9 mmHg (ref 35.0–45.0)
pH, Arterial: 7.455 — ABNORMAL HIGH (ref 7.350–7.450)
pO2, Arterial: 213 mmHg — ABNORMAL HIGH (ref 80.0–100.0)

## 2013-12-20 MED ORDER — MORPHINE SULFATE 2 MG/ML IJ SOLN
1.0000 mg | Freq: Once | INTRAMUSCULAR | Status: AC
  Administered 2013-12-20: 1 mg via INTRAVENOUS

## 2013-12-20 MED ORDER — ETOMIDATE 2 MG/ML IV SOLN
INTRAVENOUS | Status: AC
  Filled 2013-12-20: qty 20

## 2013-12-20 MED ORDER — CHLORHEXIDINE GLUCONATE 0.12 % MT SOLN
15.0000 mL | Freq: Two times a day (BID) | OROMUCOSAL | Status: DC
  Administered 2013-12-21 – 2013-12-23 (×5): 15 mL via OROMUCOSAL
  Filled 2013-12-20 (×5): qty 15

## 2013-12-20 MED ORDER — MIDAZOLAM HCL 2 MG/2ML IJ SOLN
INTRAMUSCULAR | Status: AC
  Filled 2013-12-20: qty 2

## 2013-12-20 MED ORDER — MORPHINE SULFATE 2 MG/ML IJ SOLN
INTRAMUSCULAR | Status: AC
  Filled 2013-12-20: qty 1

## 2013-12-20 MED ORDER — VANCOMYCIN HCL 500 MG IV SOLR
500.0000 mg | Freq: Three times a day (TID) | INTRAVENOUS | Status: DC
  Administered 2013-12-20 – 2013-12-21 (×4): 500 mg via INTRAVENOUS
  Filled 2013-12-20 (×5): qty 500

## 2013-12-20 MED ORDER — ASPIRIN 300 MG RE SUPP
300.0000 mg | Freq: Every day | RECTAL | Status: DC
  Administered 2013-12-20: 300 mg via RECTAL
  Filled 2013-12-20: qty 1

## 2013-12-20 MED ORDER — SUCCINYLCHOLINE CHLORIDE 20 MG/ML IJ SOLN
INTRAMUSCULAR | Status: AC
  Filled 2013-12-20: qty 1

## 2013-12-20 MED ORDER — CETYLPYRIDINIUM CHLORIDE 0.05 % MT LIQD
7.0000 mL | Freq: Four times a day (QID) | OROMUCOSAL | Status: DC
  Administered 2013-12-21 – 2013-12-23 (×10): 7 mL via OROMUCOSAL

## 2013-12-20 MED ORDER — POTASSIUM CHLORIDE 20 MEQ/15ML (10%) PO LIQD
20.0000 meq | ORAL | Status: AC
  Administered 2013-12-20 (×2): 20 meq
  Filled 2013-12-20 (×2): qty 15

## 2013-12-20 MED ORDER — FENTANYL CITRATE 0.05 MG/ML IJ SOLN
50.0000 ug | INTRAMUSCULAR | Status: DC | PRN
  Administered 2013-12-20: 50 ug via INTRAVENOUS
  Filled 2013-12-20 (×2): qty 2

## 2013-12-20 MED ORDER — CLONAZEPAM 1 MG PO TABS
1.0000 mg | ORAL_TABLET | Freq: Every day | ORAL | Status: DC
  Administered 2013-12-20 – 2013-12-22 (×3): 1 mg
  Filled 2013-12-20 (×4): qty 1

## 2013-12-20 MED ORDER — FENTANYL CITRATE 0.05 MG/ML IJ SOLN
100.0000 ug | INTRAMUSCULAR | Status: DC | PRN
  Administered 2013-12-20 (×2): 100 ug via INTRAVENOUS
  Filled 2013-12-20 (×2): qty 2

## 2013-12-20 MED ORDER — FENTANYL CITRATE 0.05 MG/ML IJ SOLN
100.0000 ug | Freq: Once | INTRAMUSCULAR | Status: AC
  Administered 2013-12-20: 100 ug via INTRAVENOUS

## 2013-12-20 MED ORDER — ROCURONIUM BROMIDE 50 MG/5ML IV SOLN
INTRAVENOUS | Status: AC
  Filled 2013-12-20: qty 2

## 2013-12-20 MED ORDER — FENTANYL CITRATE 0.05 MG/ML IJ SOLN
100.0000 ug | INTRAMUSCULAR | Status: DC | PRN
  Administered 2013-12-21 (×2): 100 ug via INTRAVENOUS
  Filled 2013-12-20 (×2): qty 2

## 2013-12-20 MED ORDER — MIDAZOLAM HCL 2 MG/2ML IJ SOLN
2.0000 mg | Freq: Once | INTRAMUSCULAR | Status: AC
  Administered 2013-12-20: 2 mg via INTRAVENOUS

## 2013-12-20 MED ORDER — LIDOCAINE HCL (CARDIAC) 20 MG/ML IV SOLN
INTRAVENOUS | Status: AC
  Filled 2013-12-20: qty 5

## 2013-12-20 MED ORDER — ETOMIDATE 2 MG/ML IV SOLN
20.0000 mg | Freq: Once | INTRAVENOUS | Status: AC
  Administered 2013-12-20: 20 mg via INTRAVENOUS
  Filled 2013-12-20: qty 10

## 2013-12-20 MED ORDER — ROCURONIUM BROMIDE 50 MG/5ML IV SOLN
1.0000 mg/kg | Freq: Once | INTRAVENOUS | Status: AC
  Administered 2013-12-20: 52 mg via INTRAVENOUS
  Filled 2013-12-20: qty 5.2

## 2013-12-20 NOTE — Progress Notes (Signed)
PULMONARY / CRITICAL CARE MEDICINE   Name: Hector [name] MRN: [id number] DOB: 05/14/1970    ADMISSION DATE:  12/11/2013 CONSULTATION DATE:  12/13/13  REFERRING MD :  Dr. Izola Price / [facility]   CHIEF COMPLAINT:  Seizure, concern for airway protection   INITIAL PRESENTATION:  43 yo male presented to ER for involuntary commitment.  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  He was noted to having jerking movement with ? Seizure.  PCCM asked to assess with concern for airway protection/aspiration.  STUDIES:  8/17  Renal US >> normal renal US 8/17  CTA Chest >> no acute disease, mild centrilobular emphysema 8/19  CT ABD/Pelvis >> moderate amt stool in large bowel, G-tube, no acute process, bilateral non-obs up to 3mm stones  SIGNIFICANT EVENTS: 8/17 Seen in ER with Rt flank pain, urine retention  8/18 Seen in ER for agitation, involuntary commitment papers placed 8/20 Admitted to SDU after shaking / agitation, concerns for seizures & ? R airspace disease 8/21 Improved agitation, brady with precedex 8/26 Started Abx for HCAP, precedex d/c'ed  SUBJECTIVE: Weak cough.  VITAL SIGNS: Temp:  [97.8 F (36.6 C)-99.5 F (37.5 C)] 99.5 F (37.5 C) (08/26 2313) Pulse Rate:  [97-135] 127 (08/27 0600) Resp:  [24-57] 41 (08/27 0600) BP: (88-161)/(31-89) 144/57 mmHg (08/27 0600) SpO2:  [90 %-100 %] 98 % (08/27 0600) FiO2 (%):  [100 %] 100 % (08/27 0400)  INTAKE / OUTPUT:  Intake/Output Summary (Last 24 hours) at 12/20/13 0743 Last data filed at 12/20/13 0500  Gross per 24 hour  Intake   2470 ml  Output   2370 ml  Net    100 ml    PHYSICAL EXAMINATION: General: thin adult male in no acute distress Neuro: RASS -1, opens eyes with stimulation, moans HEENT: no sinus tenderness  Cardiovascular: regular, tachycardic Lungs: b/l rhonchi, decreased BS Rt base Abdomen: soft, non tender, PEG site clean Musculoskeletal: no edema Skin: irritation on Lt wrist  LABS:  CBC  Recent Labs Lab  12/18/13 0337 12/19/13 0350 12/20/13 0350  WBC 9.4 13.0* 6.9  HGB 10.8* 10.5* 9.8*  HCT 33.2* 33.7* 31.8*  PLT 110* 121* 124*   BMET  Recent Labs Lab 12/18/13 0337 12/19/13 0350 12/20/13 0350  NA 141 142 142  K 3.3* 3.6* 3.5*  CL 103 102 106  CO2 BUN CREATININE 0.52 0.58 0.59  GLUCOSE 160* 184* 156*   Electrolytes  Recent Labs Lab 12/14/13 0340  12/18/13 0337 12/19/13 0350 12/20/13 0350  CALCIUM 9.5  < > 9.1 9.2 9.0  MG 2.0  --  1.9 1.8  --   PHOS 3.3  --  2.4  --   --   < > = values in this interval not displayed.  Sepsis Markers  Recent Labs Lab 12/15/13 0407 12/17/13 0343  PROCALCITON 0.12 0.21   ABG  Recent Labs Lab 12/13/13 0912 12/18/13 1748 12/19/13 0359  PHART 7.418 7.398 7.442  PCO2ART 31.5* 43.5 43.9  PO2ART 70.5* 65.7* 99.2   Liver Enzymes  Recent Labs Lab 12/14/13 0340  AST 43*  ALT 28  ALKPHOS 199*  BILITOT 0.6  ALBUMIN 2.9*   Cardiac Enzymes  Recent Labs Lab 12/13/13 1114  TROPONINI <0.30  PROBNP 729.4*   Imaging Dg Chest Port 1 View  12/19/2013   CLINICAL DATA:  Difficulty breathing  EXAM: PORTABLE CHEST - 1 VIEW  COMPARISON:  12/18/2013  FINDINGS: Cardiomediastinal silhouette is stable.  There is small right pleural effusion. Hazy airspace disease right lung and left perihilar suspicious for asymmetric edema or pneumonia.  IMPRESSION: There is small right pleural effusion. Hazy airspace disease right lung and left perihilar suspicious for asymmetric edema or pneumonia.   Electronically Signed   By: Natasha Mead M.D.   On: 12/19/2013 08:57     ASSESSMENT / PLAN:  NEUROLOGY Severe agitated delirium. 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 Expressive aphasia. Hx of ETOH. P:   RASS goal: -1 Hold home regimen:  Amantadine, bromocriptine, remeron Continue valproate Continue Risperdal  TID  Change klonopin to 1 mg qhs on 8/27 PRN haldol,  fentanyl F/u CT head when stable to have procedure done Might need psych to re-assess >> seen on day of admission, but not since  PULMONARY Acute hypoxic respiratory 2nd to HCAP developed 8/26. Atelectasis. Hx of tobacco abuse. Mild emphysema changes noted on CT chest 12/10/13. P: Adjust oxygen to keep SpO2 > 92% Chest PT Monitor need for intubation Continue nicotine patch F/u CXR PRN NTS  CARDIOLOGY Sinus tachycardia in setting of acute agitated delirium, and hypoxia. Hx DVT - upper & lower extremity DVT during admission in 07/2013, on xarelto at baseline. P:  Monitor hemodynamics Will have pharmacy start full dose lovenox in place of xarelto for now  RENAL Mild Rhabdo in setting of agitation >> resolved. Hypernatremia >> resolved. Hypokalemia. P:   Continue free water Monitor renal fx, urine outpt F/u and replace electrolytes as needed  GASTROINTESTINAL Dysphagia s/p PEG. Protein calorie malnutrition. P:   Continue tube feeds Protonix for SUP  HEMATOLOGIC Anemia of chronic disease. Mild thrombocytopenia. Leukocytosis in setting of HCAP >> improved 8/26. P:  Monitor CBC  INFECTIOUS Presumed HCAP developed 8/26. P:   Day 2 fortaz, started 8/26  Sputum 8/26 >> Blood 8/26 >>   ENDOCRINE No acute issues. P:   Monitor blood sugar on BMET  TODAY'S SUMMARY:   He has developed and HCAP.  He has progressive ATX/effusion on Rt > Lt.  Will continue ABx, and augment bronchial hygiene.  Hopefully can avoid intubation.  Will continue to titrate down sedation/analgesia as tolerated.  CC time 35 minutes.  Coralyn Helling, MD [facility] Pulmonary/Critical Care 12/20/2013, 7:43 AM Pager:  [phone] After 3pm call: [phone]

## 2013-12-20 NOTE — Procedures (Signed)
Intubation Procedure Note CHERRY WITTWER [id number] 07-Jun-1970  Procedure: Intubation Indications: Respiratory insufficiency  Procedure Details Consent: Unable to obtain consent because of emergent medical necessity. Time Out: Verified patient identification, verified procedure, site/side was marked, verified correct patient position, special equipment/implants available, medications/allergies/relevent history reviewed, required imaging and test results available.  Performed  Maximum sterile technique was used including cap, hand hygiene and mask.  MAC and 4 > Glidescope  Evaluation Hemodynamic Status: BP stable throughout; O2 sats: stable throughout Patient's Current Condition: stable Complications: No apparent complications Patient did tolerate procedure well. Chest X-ray ordered to verify placement.  CXR: pending.  Joneen Roach, ACNP [facility] Pulmonology/Critical Care Pager [phone] or [phone]

## 2013-12-20 NOTE — Procedures (Signed)
Supervised intubation at bedside  Dr. Kalman Shan, M.D., [facility].C.P Pulmonary and Critical Care Medicine Staff Physician [facility] [place] Pulmonary and Critical Care Pager: [phone], If no answer or between  15:00h - 7:00h: call [phone]  12/20/2013 10:11 PM

## 2013-12-20 NOTE — Progress Notes (Signed)
LB PCCM PROGRESS NOTE LB PCCM PROGRESS NOTE  S: Called to bedside by [facility] MD to evaluate for respiratory distress. It is reported that this has been going on for some time, but has recently worsened. Upon my arrival to bedside patient was tachypneic with significantly increased WOB.   O: BP 139/88  Pulse 130  Temp(Src) 99 F (37.2 C) (Axillary)  Resp 45  Ht  (1.702 m)  Wt 52 kg (114 lb 10.2 oz)  BMI 17.95 kg/m2  SpO2 95% on 100% NRB  General:    Cachectic male with severe respiratory distress.  Neuro:  Awake, eyes open spontaneously, non-verbal. Does not follow commands.  HEENT:  [place]/AT,  No JVD noted. Cardiovascular:  Tachy, regular rhythm Lungs:  Bilateral rhonchi, significantly increased effort with labored breathing.  Abdomen:  Soft, non-tender, non-distended Musculoskeletal:  ROM intact, no acute deformity Skin:  Intact, MMM   A/P: Acute hypoxic respiratory 2nd to HCAP developed 8/26 Sinus tachycardia Hypotension - STAT intubation - PRVC, 8cc/kg, f14, 100%, PEEP 5.  - Titrate FiO2 for SpO2 goal >92% per RT - ABG 1 hour post intubation - NS bolus - Repeat CXR - Pulmonary hygiene   Joneen Roach, ACNP [facility] Pulmonology/Critical Care Pager [phone] or [phone]

## 2013-12-20 NOTE — Progress Notes (Signed)
[facility] ADULT ICU REPLACEMENT PROTOCOL FOR AM LAB REPLACEMENT ONLY  The patient does apply for the [facility] Adult ICU Electrolyte Replacment Protocol based on the criteria listed below:   1. Is GFR >/= 40 ml/min? Yes.    Patient's GFR today is >90 2. Is urine output >/= 0.5 ml/kg/hr for the last 6 hours? Yes.   Patient's UOP is 2.45 ml/kg/hr 3. Is BUN < 60 mg/dL? Yes.    Patient's BUN today is 10 4. Abnormal electrolyte(s): K3.5 5. Ordered repletion with: 40 meq/tube 6. If a panic level lab has been reported, has the CCM MD in charge been notified? Yes.  .   Physician:  Desiree Lucy 12/20/2013 5:48 AM

## 2013-12-20 NOTE — Progress Notes (Signed)
ANTIBIOTIC CONSULT NOTE   Pharmacy Consult for Ceftazidime, Vancomycin Indication: HCAP  Allergies  Allergen Reactions  . Ativan [Lorazepam]     Pt has paradoxical effect with sedating medications.     Patient Measurements: Height:  (170.2 cm) Weight: 114 lb 10.2 oz (52 kg) IBW/kg (Calculated) : 66.1  Vital Signs: Temp: 98.4 F (36.9 C) (08/27 0800) Temp src: Axillary (08/27 0800) BP: 128/59 mmHg (08/27 0800) Pulse Rate: 121 (08/27 0800) Intake/Output from previous day: 08/26 0701 - 08/27 0700 In: 2470 [I.V.:220; NG/GT:1100; IV Piggyback:1150] Out: 2370 [Urine:2370]  Labs:  Recent Labs  12/18/13 0337 12/19/13 0350 12/20/13 0350  WBC 9.4 13.0* 6.9  HGB 10.8* 10.5* 9.8*  PLT 110* 121* 124*  CREATININE 0.52 0.58 0.59   Estimated Creatinine Clearance: 85.8 ml/min (by C-G formula based on Cr of 0.59).   Medical History: Past Medical History  Diagnosis Date  . 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 Seizure   . DVT (deep venous thrombosis)     UE, LE DVT during hospitalization  . H/O renal calculi     Assessment: 25 yoM presented on 8/17 with h/o traumatic brain injury, seizures, in ED x 3 days awaiting placement. Became febrile and hypotensive with possible seizure activity and r/o pneumonia.  Received one time doses of Zosyn and Vanc for sepsis/HCAP on 8/20.  Pharmacy is now consulted to dose ceftazidime and Vancomycin for suspected HCAP.  8/20 >> Zosyn >> 8/20 8/20 >> Vanc >> 8/20 8/26 >> Ceftazidime >> 8/27 >> Vanc >>  Tmax: 99.5 WBCs: improved to WNL  Renal: SCr 0.59 with CrCl ~ 86 ml/min  8/20 MRSA PCR: positive 8/20 blood x2: NGF 8/20 urine: NGF 8/26 sputum: sent 8/26 blood x2: sent   Goal of Therapy:  Appropriate abx dosing, eradication of infection. Vancomycin trough level 15-20 mcg/ml  Plan:   Ceftazidime 2g IV q8h. Vancomycin  IV q8h. Measure Vanc trough at steady state. Follow up renal  fxn and culture results.   Lynann Beaver PharmD, BCPS Pager [phone] 12/20/2013 9:00 AM

## 2013-12-20 NOTE — Progress Notes (Signed)
CARE MANAGEMENT NOTE 12/20/2013  Patient:  Hector [name], Hector [name]   Account Number:  [id number]  Date Initiated:  12/13/2013  Documentation initiated by:  Berenda Morale  Subjective/Objective Assessment:   See ED Case Management Note     Action/Plan:   Anticipated DC Date:  12/23/2013   Anticipated DC Plan:  SKILLED NURSING FACILITY  In-house referral  NA      DC Planning Services  CM consult      PAC Choice  NA   Choice offered to / List presented to:  NA      DME agency  NA     HH arranged  NA      HH agency  NA   Status of service:  In process, will continue to follow Medicare Important Message given?  NA - LOS <3 / Initial given by admissions (If response is "NO", the following Medicare IM given date fields will be blank) Date Medicare IM given:   Medicare IM given by:   Date Additional Medicare IM given:   Additional Medicare IM given by:    Discharge Disposition:    Per UR Regulation:  Reviewed for med. necessity/level of care/duration of stay  If discussed at Long Length of Stay Meetings, dates discussed:   12/18/2013  12/20/2013    Comments:  [id number]/Reni Hausner,RN,BSN,CCM: Fi02 up to 100% on a nonrebreathing mask/agitation is presisting but iv predcedex is off/iv abx cont/patient is a high risk for intubation. [id number]/[id number] Shantanique Hodo,RN,BSN,CCM: Chart reviewed for any dc needs.  Off iv precedex drip this a.m./psych med adjustment ongoing/pt was ivc'd at admission /plan is to continue with this and possible updated pysch eval.  [id number]/FAOZHY Madeleyn Schwimmer,RN,BSN,CCM: Patient remains highly agitiated, confused., poss etoh w/d iv precedex.  12/13/13 Sandford Craze Rn, BSN, NCM Will await PT recommendations and assist with DC needs.

## 2013-12-20 NOTE — Progress Notes (Signed)
CPT held due to tachycardia and tachypnea at this time

## 2013-12-20 NOTE — Accreditation Note (Signed)
Patient's parent called on [phone], spoke with Chanan Detwiler( father) at about 2136. Father made aware of pt's increased tachypnea and tachycardia and possible intubation by [facility] physician, not resolving with medication. Patient Father agreed with the intubation and wanted everything done to save patient.

## 2013-12-21 ENCOUNTER — Inpatient Hospital Stay (HOSPITAL_COMMUNITY): Payer: Medicaid Other

## 2013-12-21 LAB — BASIC METABOLIC PANEL
Anion gap: 12 (ref 5–15)
BUN: 10 mg/dL (ref 6–23)
CHLORIDE: 107 meq/L (ref 96–112)
CO2: 26 mEq/L (ref 19–32)
Calcium: 9.1 mg/dL (ref 8.4–10.5)
Creatinine, Ser: 0.54 mg/dL (ref 0.50–1.35)
GFR calc non Af Amer: >90 mL/min (ref >90)
Glucose, Bld: 149 mg/dL — ABNORMAL HIGH (ref 70–99)
POTASSIUM: 4.4 meq/L (ref 3.7–5.3)
Sodium: 145 mEq/L (ref 137–147)

## 2013-12-21 LAB — VANCOMYCIN, TROUGH: Vancomycin Tr: 11 ug/mL (ref 10.0–20.0)

## 2013-12-21 LAB — TROPONIN I
Troponin I: <0.3 ng/mL (ref <0.30)
Troponin I: <0.3 ng/mL (ref <0.30)
Troponin I: <0.3 ng/mL (ref <0.30)

## 2013-12-21 LAB — CBC
HCT: 31.6 % — ABNORMAL LOW (ref 39.0–52.0)
HEMOGLOBIN: 10.2 g/dL — AB (ref 13.0–17.0)
MCH: 28.2 pg (ref 26.0–34.0)
MCHC: 32.3 g/dL (ref 30.0–36.0)
MCV: 87.3 fL (ref 78.0–100.0)
Platelets: 158 10*3/uL (ref 150–400)
RBC: 3.62 MIL/uL — ABNORMAL LOW (ref 4.22–5.81)
RDW: 16.7 % — ABNORMAL HIGH (ref 11.5–15.5)
WBC: 7.7 10*3/uL (ref 4.0–10.5)

## 2013-12-21 MED ORDER — METOPROLOL TARTRATE 12.5 MG HALF TABLET
12.5000 mg | ORAL_TABLET | Freq: Once | ORAL | Status: AC
  Administered 2013-12-21: 12.5 mg via ORAL
  Filled 2013-12-21: qty 1

## 2013-12-21 MED ORDER — FENTANYL CITRATE 0.05 MG/ML IJ SOLN
25.0000 ug | INTRAMUSCULAR | Status: DC | PRN
  Administered 2013-12-21 – 2013-12-22 (×5): 25 ug via INTRAVENOUS
  Filled 2013-12-21 (×6): qty 2

## 2013-12-21 MED ORDER — ASPIRIN 81 MG PO CHEW
81.0000 mg | CHEWABLE_TABLET | Freq: Every day | ORAL | Status: DC
  Administered 2013-12-21 – 2014-01-03 (×14): 81 mg
  Filled 2013-12-21 (×14): qty 1

## 2013-12-21 MED ORDER — SODIUM CHLORIDE 0.9 % IV BOLUS (SEPSIS)
500.0000 mL | Freq: Once | INTRAVENOUS | Status: AC
  Administered 2013-12-21: 500 mL via INTRAVENOUS

## 2013-12-21 MED ORDER — VANCOMYCIN HCL IN DEXTROSE 750-5 MG/150ML-% IV SOLN
750.0000 mg | Freq: Three times a day (TID) | INTRAVENOUS | Status: DC
  Administered 2013-12-21 – 2013-12-24 (×10): 750 mg via INTRAVENOUS
  Filled 2013-12-21 (×11): qty 150

## 2013-12-21 MED ORDER — METOPROLOL TARTRATE 25 MG/10 ML ORAL SUSPENSION
12.5000 mg | Freq: Two times a day (BID) | ORAL | Status: DC
  Administered 2013-12-22 – 2014-01-03 (×25): 12.5 mg
  Filled 2013-12-21 (×29): qty 5

## 2013-12-21 MED ORDER — NICOTINE 7 MG/24HR TD PT24
7.0000 mg | MEDICATED_PATCH | Freq: Every day | TRANSDERMAL | Status: DC
  Administered 2013-12-21 – 2013-12-24 (×4): 7 mg via TRANSDERMAL
  Filled 2013-12-21 (×6): qty 1

## 2013-12-21 NOTE — Progress Notes (Signed)
Subjective: Patient extubated on yesterday and found to have some RUE weakness.  Asked to re-evaluate patient.  Since that time became tachypneic and unable to protect his airway.  Has required re-intubation.  Since that time nursing reports that conversation has been had with father who reports that the RUE weakness predated this hospitalization.   Objective: Current vital signs: BP 99/48  Pulse 125  Temp(Src) 101.5 F (38.6 C) (Oral)  Resp 23  Ht  (1.702 m)  Wt 52 kg (114 lb 10.2 oz)  BMI 17.95 kg/m2  SpO2 100% Vital signs in last 24 hours: Temp:  [98.7 F (37.1 C)-101.5 F (38.6 C)] 101.5 F (38.6 C) (08/28 0800) Pulse Rate:  [124-152] 125 (08/28 1000) Resp:  [14-48] 23 (08/28 1000) BP: (96-171)/(48-95) 99/48 mmHg (08/28 1000) SpO2:  [93 %-100 %] 100 % (08/28 1000) FiO2 (%):  [40 %-100 %] 40 % (08/28 1000)  Intake/Output from previous day: 08/27 0701 - 08/28 0700 In: 2180 [I.V.:230; NG/GT:950; IV Piggyback:800] Out: 2505 [Urine:2505] Intake/Output this shift: Total I/O In: 230.2 [I.V.:30.2; NG/GT:150; IV Piggyback:50] Out: 245 [Urine:135; Drains:110] Nutritional status: NPO  Neurologic Exam: Mental Status: Lethargic and only alerted with deep sternal rub.  Does not follow commands.  No speech.   Cranial Nerves: II: Discs flat bilaterally; Pupils equal, round, reactive to light and accommodation III,IV, VI: ptosis not present, Oculocephalic maneuvers intact bilaterally V,VII: corneals intact bilaterally VIII: hearing normal bilaterally IX,X: gag reflex present XI: unable to test XII: unable to test Motor: When stimulated has movement of all extremities rising the right arm less than the left.  Patient lying on the right.  ? Wrist drop on the right Sensory: Does not respond to pinprick and light touch throughout Deep Tendon Reflexes: 2+ and symmetric throughout   Lab Results: Basic Metabolic Panel:  Recent Labs Lab 12/17/13 0343 12/18/13 0337  12/19/13 0350 12/20/13 0350 12/21/13 0039  NA 140 141 142 142 145  K 3.6* 3.3* 3.6* 3.5* 4.4  CL 102 103 102 106 107  CO2 GLUCOSE 149* 160* 184* 156* 149*  BUN 4* CREATININE 0.50 0.52 0.58 0.59 0.54  CALCIUM 8.8 9.1 9.2 9.0 9.1  MG  --  1.9 1.8  --   --   PHOS  --  2.4  --   --   --     Liver Function Tests: No results found for this basename: AST, ALT, ALKPHOS, BILITOT, PROT, ALBUMIN,  in the last 168 hours No results found for this basename: LIPASE, AMYLASE,  in the last 168 hours No results found for this basename: AMMONIA,  in the last 168 hours  CBC:  Recent Labs Lab 12/17/13 0343 12/18/13 0337 12/19/13 0350 12/20/13 0350 12/21/13 0039  WBC 8.1 9.4 13.0* 6.9 7.7  HGB 10.1* 10.8* 10.5* 9.8* 10.2*  HCT 31.0* 33.2* 33.7* 31.8* 31.6*  MCV 84.9 84.9 86.2 87.6 87.3  PLT 114* 110* 121* 124* 158    Cardiac Enzymes:  Recent Labs Lab 12/16/13 0400 12/21/13 0038 12/21/13 0630  CKTOTAL 215  --   --   TROPONINI  --  <0.30 <0.30    Lipid Panel: No results found for this basename: CHOL, TRIG, HDL, CHOLHDL, VLDL, LDLCALC,  in the last 168 hours  CBG: No results found for this basename: GLUCAP,  in the last 168 hours  Microbiology: Results for orders placed during the hospital encounter of 12/11/13  CULTURE, BLOOD (ROUTINE X 2)  Status: None   Collection Time    12/13/13  5:04 AM      Result Value Ref Range Status   Specimen Description BLOOD RIGHT HAND   Final   Special Requests BOTTLES DRAWN AEROBIC AND ANAEROBIC [facility]   Final   Culture  Setup Time     Final   Value: 12/13/2013 08:47     Performed at [facility]   Culture     Final   Value: NO GROWTH 5 DAYS     Performed at [facility]   Report Status 12/19/2013 FINAL   Final  CULTURE, BLOOD (ROUTINE X 2)     Status: None   Collection Time    12/13/13  5:09 AM      Result Value Ref Range Status   Specimen Description BLOOD LEFT FOREARM   Final   Special  Requests BOTTLES DRAWN AEROBIC AND ANAEROBIC [facility]   Final   Culture  Setup Time     Final   Value: 12/13/2013 08:47     Performed at [facility]   Culture     Final   Value: NO GROWTH 5 DAYS     Performed at [facility]   Report Status 12/19/2013 FINAL   Final  URINE CULTURE     Status: None   Collection Time    12/13/13  9:18 AM      Result Value Ref Range Status   Specimen Description URINE, CATHETERIZED   Final   Special Requests Normal   Final   Culture  Setup Time     Final   Value: 12/13/2013 12:33     Performed at [facility]     Final   Value: NO GROWTH     Performed at [facility]   Culture     Final   Value: NO GROWTH     Performed at [facility]   Report Status 12/14/2013 FINAL   Final  MRSA PCR SCREENING     Status: Abnormal   Collection Time    12/13/13  9:18 AM      Result Value Ref Range Status   MRSA by PCR POSITIVE (*) NEGATIVE Final   Comment:            The GeneXpert MRSA Assay (FDA     approved for NASAL specimens     only), is one component of a     comprehensive MRSA colonization     surveillance program. It is not     intended to diagnose MRSA     infection nor to guide or     monitor treatment for     MRSA infections.     RESULT CALLED TO, READ BACK BY AND VERIFIED WITH:     K. STRUP RN AT 1055 ON 08.20.15 BY SHUEA  CULTURE, BLOOD (ROUTINE X 2)     Status: None   Collection Time    12/19/13 10:05 AM      Result Value Ref Range Status   Specimen Description BLOOD LEFT HAND   Final   Special Requests BOTTLES DRAWN AEROBIC AND ANAEROBIC 5CC   Final   Culture  Setup Time     Final   Value: 12/19/2013 11:50     Performed at [facility]   Culture     Final   Value:        BLOOD CULTURE RECEIVED NO GROWTH TO DATE CULTURE  WILL BE HELD FOR 5 DAYS BEFORE ISSUING A FINAL NEGATIVE REPORT     Performed at [facility]   Report Status PENDING   Incomplete  CULTURE, BLOOD  (ROUTINE X 2)     Status: None   Collection Time    12/19/13 10:15 AM      Result Value Ref Range Status   Specimen Description BLOOD RIGHT HAND   Final   Special Requests BOTTLES DRAWN AEROBIC AND ANAEROBIC 5CC   Final   Culture  Setup Time     Final   Value: 12/19/2013 11:50     Performed at [facility]   Culture     Final   Value:        BLOOD CULTURE RECEIVED NO GROWTH TO DATE CULTURE WILL BE HELD FOR 5 DAYS BEFORE ISSUING A FINAL NEGATIVE REPORT     Performed at [facility]   Report Status PENDING   Incomplete  CULTURE, RESPIRATORY (NON-EXPECTORATED)     Status: None   Collection Time    12/20/13  3:00 PM      Result Value Ref Range Status   Specimen Description SPUTUM   Final   Special Requests NONE   Final   Gram Stain     Final   Value: FEW WBC PRESENT,BOTH PMN AND MONONUCLEAR     RARE SQUAMOUS EPITHELIAL CELLS PRESENT     RARE GRAM POSITIVE COCCI     IN PAIRS IN CLUSTERS     Performed at [facility]   Culture     Final   Value: NO GROWTH 1 DAY     Performed at [facility]   Report Status PENDING   Incomplete    Coagulation Studies: No results found for this basename: LABPROT, INR,  in the last 72 hours  Imaging: Ct Head Wo Contrast  12/20/2013   CLINICAL DATA:  Right upper extremity weakness  EXAM: CT HEAD WITHOUT CONTRAST  TECHNIQUE: Contiguous axial images were obtained from the base of the skull through the vertex without intravenous contrast.  COMPARISON:  December 17, 2013  FINDINGS: There is moderate generalized atrophy. There is enlargement of the left lateral ventricle compared to the right lateral ventricle, a finding felt to be secondary to ex vacuo phenomenon. There is no appreciable mass, hemorrhage, extra-axial fluid collection, or midline shift. There is evidence of a prior infarct involving much of the left temporal lobe. There is also prior infarct in the posterior mid to superior left frontal lobe as well as infarction at  the gray - white junction of the inferior medial left parietal lobe. There is extensive prior infarction throughout much of the posterior left corona radiata. There is no new gray-white compartment lesion. No acute appearing infarct is seen compared to recent prior study.  The bony calvarium appears intact. The patient is status post left frontal craniotomy. Bony calvarium otherwise appears intact. Mastoid air cells are clear. There is diffuse opacification of the right maxillary antrum.  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 No acute appearing infarct. No hemorrhage or mass effect. Extensive right maxillary sinus disease. Patient is status post left frontal craniotomy. There is no appreciable change compared to recent prior study .   Electronically Signed   By: Bretta Bang M.D.   On: 12/20/2013 14:01   Dg Chest Port 1 View  12/21/2013   CLINICAL DATA:  Pneumonia, atelectasis and pleural effusion.  EXAM: PORTABLE CHEST - 1  VIEW  COMPARISON:  12/20/2013.  FINDINGS: There is increased irregular interstitial opacities and hazy airspace opacity in the lung bases, most likely due to atelectasis. Other areas of ill-defined interstitial densities throughout both lungs are stable. Mild hazy airspace opacity in the medial right apex and right lower lobe inferiorly adjacent to the minor fissure are stable. No pneumothorax. Minimal pleural effusions are suspected.  Cardiac silhouette is normal in size. No mediastinal or hilar masses.  Endotracheal tube is stable and well positioned.  IMPRESSION: 1. Slight worsening of lung aeration at the lung bases most likely due to increased atelectasis. 2. Other findings described previously including diffuse bilateral irregular interstitial densities and areas of hazy airspace opacity are without change. Findings suggest pulmonary edema superimposed on areas of chronic interstitial scarring. Interstitial infiltrates with areas of hazy  airspace multifocal pneumonia is also possible.   Electronically Signed   By: Amie Portland M.D.   On: 12/21/2013 07:11   Dg Chest Port 1 View  12/20/2013   CLINICAL DATA:  Endotracheal tube placement.  EXAM: PORTABLE CHEST - 1 VIEW  COMPARISON:  12/20/2013  FINDINGS: Endotracheal tube is been placed. Tip measures 2.6 mm cm above the carina. Probable emphysematous changes and fibrosis in the lungs. Normal heart size. Alteration of the left hemidiaphragm suggests small effusion. Diffuse bilateral perihilar interstitial infiltrates may represent edema or pneumonia. Gastrostomy tube.  IMPRESSION: Endotracheal tube tip measures 2.6 cm above the carinal. Diffuse perihilar interstitial infiltrates again demonstrated. Probable small left pleural effusion.   Electronically Signed   By: Burman Nieves M.D.   On: 12/20/2013 22:46   Dg Chest Port 1 View  12/20/2013   CLINICAL DATA:  Follow-up of pneumonia  EXAM: PORTABLE CHEST - 1 VIEW  COMPARISON:  Portable chest x-ray of December 19, 2013  FINDINGS: The lungs are adequately inflated. There remains increased density in the left mid and upper lung and in the right mid and lower lung. The cardiopericardial silhouette is mildly enlarged though stable. The central pulmonary vascularity is prominent. The mediastinum is normal in width.  IMPRESSION: There remain bilateral interstitial infiltrates consistent with pneumonia. A small amount of pleural fluid is likely present at the lung bases. Overall there has been slight interval improvement since yesterday's study.   Electronically Signed   By: David  [place]   On: 12/20/2013 07:43    Medications:  I have reviewed the patient's current medications. Scheduled: . antiseptic oral rinse  7 mL Mouth Rinse QID  . aspirin  81 mg Per Tube Daily  . cefTAZidime (FORTAZ)  IV  2 g Intravenous Q8H  . chlorhexidine  15 mL Mouth Rinse BID  . clonazePAM  1 mg Per Tube QHS  . docusate  100 mg Per Tube BID  . enoxaparin (LOVENOX)  injection  50 mg Subcutaneous Q12H  . feeding supplement (JEVITY 1.5 CAL/FIBER)  1,000 mL Per Tube Q24H  . free water  200 mL Per Tube Q6H  . metoprolol tartrate  12.5 mg Per Tube Q12H  . metoprolol tartrate  12.5 mg Oral Once  . nicotine  7 mg Transdermal Daily  . pantoprazole sodium  40 mg Per Tube Q24H  . risperiDONE  1 mg Per Tube 3 times per day  . sennosides  5 mL Per Tube BID  . sodium chloride  3 mL Intravenous Q12H  . Valproic Acid  500 mg Per Tube Q12H  . vancomycin  500 mg Intravenous Q8H    Assessment/Plan: Patient re-intubated and on  Fentanyl therefore obtunded.  Per family RUE weakness is old.  Head CT reviewed and shows no acute changes.  Chronic infarcts noted as well as evidence of old craniotomy.  Patient remains on Depakote.  Last LFT's on 8/21 only mildly elevated.    Recommendations: 1.  Would continue ASA 2.  No further imaging recommended at this time 3.  Continue Depakote at current dose.  Would recheck LFT's.     LOS: 10 days   Thana Farr, MD [facility] [phone] 12/21/2013  10:48 AM

## 2013-12-21 NOTE — Progress Notes (Addendum)
NUTRITION FOLLOW UP  Intervention:   - Continue TF via PEG of Jevity 1.5 at 51m/hr  - Continue adult enteral protocol  - RD to continue to monitor   Nutrition Dx:   Inadequate oral intake related to inability to eat as evidenced by NPO - ongoing    Goal:   1. Advance diet as tolerated to regular diet - not met, remains NPO 2. 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 Patient was comatose for prolonged period of time and then regained consciousness he was no longer verbal. Patient was discharged to home but family states have been aggressive towards them. Patient now requires 24-hour care. Family states that 3 days ago patient started to become agitated grabbing at the right flank/side. Pt in restraints. Found to have acute agitated delirium.   8/21: - Per telephone conversation with father, pt was on a regular/thin liquid diet at home and usually eats 50% of his meals and drinks 2-3 Ensure/day in addition to lots of caffeine free mountain dew  - Sample meals include mac and cheese with protein and vegetables  - Said that in the past 3-4 days pt has only been taking 1-2 bites at mealtimes  - Father said lowest weight was 105 pounds and earlier this week it was 110 pounds  - Said the only thing put through pt's PEG at home is 6.5 oz of water three times/day  - Reported pt started coughing a lot with meals on Tuesday, usually has no problems  - Denies that pt has any problems chewing  - Appears thin with mild muscle wasting in clavicles  - Per NP, plan is to start trickle TF today  - TF: Jevity 1.5 at 137mhr via PEG - 360 calories, 15g protein meeting 22% estimated calorie needs, 23% estimated protein needs  8/24: - Noted pt with agitation and  restlessness last night with huge mucous plug removed from throat - TF increased to 2050mr on 8/22 - Per RN, pt tolerating TF well with 190m63msiduals this morning - Per conversation with NP, okay for RD to manage and advance TF to goal rate  - TF: Jevity 1.5 at 20ml32m- provides 720 calories, 31g protein and meets 44% estimated calorie needs, 48% estimated protein needs - Potassium slightly low, getting liquid replacement per PEG  8/28: - Had respiratory distress 8/26 and put on non-rebreather mask - Had increased tachypnea and tachycardia last night and was intubated  - Per RN, pt with 70ml 21mF residuals this morning  TF: Jevity 1.5 at 50ml/h2mich provides 1800 calories, 76g protein, and 912ml fr66mater and meets 95% estimated calorie needs and 100% estimated protein needs  Patient is currently intubated on ventilator support MV: 10.6 L/min Temp (24hrs), Avg:99.5 F (37.5 C), Min:98.7 F (37.1 C), Max:101.5 F (38.6 C)  Propofol: off    Height: Ht Readings from Last 1 Encounters:  12/20/13 5' 7"  (1.702 m)    Weight Status:   Wt Readings from Last 1 Encounters:  12/18/13 114 lb 10.2 oz (52 kg)  Admit wt         120 lb (54.4 kg)  Net I/Os: +1.5L  Re-estimated needs:  Kcal: 1899  Protein: 65-80g  Fluid: per MD  Skin: +1 RUE, LUE edema, Right foot abrasion, right arm scab   Diet Order: NPO   Intake/Output Summary (Last 24 hours) at 12/21/13 1150 Last data filed at 12/21/13 1001  Gross per 24 hour  Intake 1760.17 ml  Output   2750 ml  Net -989.83 ml    Last BM: 8/27   Labs:   Recent Labs Lab 12/18/13 0337 12/19/13 0350 12/20/13 0350 12/21/13 0039  NA 141 142 142 145  K 3.3* 3.6* 3.5* 4.4  CL 103 102 106 107  CO2 27 27 26 26   BUN 7 12 10 10   CREATININE 0.52 0.58 0.59 0.54  CALCIUM 9.1 9.2 9.0 9.1  MG 1.9 1.8  --   --   PHOS 2.4  --   --   --   GLUCOSE 160* 184* 156* 149*    CBG (last 3)  No results found for this basename: GLUCAP,   in the last 72 hours  Scheduled Meds: . antiseptic oral rinse  7 mL Mouth Rinse QID  . aspirin  81 mg Per Tube Daily  . cefTAZidime (FORTAZ)  IV  2 g Intravenous Q8H  . chlorhexidine  15 mL Mouth Rinse BID  . clonazePAM  1 mg Per Tube QHS  . docusate  100 mg Per Tube BID  . enoxaparin (LOVENOX) injection  50 mg Subcutaneous Q12H  . feeding supplement (JEVITY 1.5 CAL/FIBER)  1,000 mL Per Tube Q24H  . free water  200 mL Per Tube Q6H  . metoprolol tartrate  12.5 mg Per Tube Q12H  . metoprolol tartrate  12.5 mg Oral Once  . nicotine  7 mg Transdermal Daily  . pantoprazole sodium  40 mg Per Tube Q24H  . risperiDONE  1 mg Per Tube 3 times per day  . sennosides  5 mL Per Tube BID  . sodium chloride  3 mL Intravenous Q12H  . Valproic Acid  500 mg Per Tube Q12H  . vancomycin  500 mg Intravenous Q8H    Continuous Infusions: . sodium chloride 10 mL/hr at 12/19/13 [place], [place], [place] [phone] Pager [phone] Weekend/After Hours Pager

## 2013-12-21 NOTE — Progress Notes (Signed)
ANTIBIOTIC CONSULT NOTE   Pharmacy Consult for Ceftazidime, Vancomycin Indication: HCAP  Allergies  Allergen Reactions  . Ativan [Lorazepam]     Pt has paradoxical effect with sedating medications.     Patient Measurements: Height:  (170.2 cm) Weight: 114 lb 10.2 oz (52 kg) IBW/kg (Calculated) : 66.1  Vital Signs: Temp: 98.9 F (37.2 C) (08/28 1700) Temp src: Oral (08/28 1700) BP: 109/58 mmHg (08/28 1800) Pulse Rate: 121 (08/28 1800) Intake/Output from previous day: 08/27 0701 - 08/28 0700 In: 2180 [I.V.:230; NG/GT:950; IV Piggyback:800] Out: 2505 [Urine:2505]  Labs:  Recent Labs  12/19/13 0350 12/20/13 0350 12/21/13 0039  WBC 13.0* 6.9 7.7  HGB 10.5* 9.8* 10.2*  PLT 121* 124* 158  CREATININE 0.58 0.59 0.54   Estimated Creatinine Clearance: 85.8 ml/min (by C-G formula based on Cr of 0.54).   Medical History: Past Medical History  Diagnosis Date  . 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 Seizure   . DVT (deep venous thrombosis)     UE, LE DVT during hospitalization  . H/O renal calculi     Assessment: 83 yoM presented on 8/17 with h/o traumatic brain injury, seizures, in ED x 3 days awaiting placement. Became febrile and hypotensive with possible seizure activity and r/o pneumonia.  Received one time doses of Zosyn and Vanc for sepsis/HCAP on 8/20.  Pharmacy is now consulted to dose ceftazidime and Vancomycin for suspected HCAP.  8/20 >> Zosyn >> 8/20 8/20 >> Vanc >> 8/20 8/26 >> Ceftazidime >> 8/27 >> Vanc >>  Tmax: 101.5 WBCs: WNL Renal: SCr low  8/20 HIV: nonreactive 8/20 Urine legionella and strep pneumo Ag: negative 8/20 MRSA PCR: positive 8/20 blood x2: NGF 8/20 urine: NGF 8/26 sputum: sent 8/26 blood x2: ngtd  8/28 1700 VT: 11 mcg/ml on  q8h prior to 5th dose  8/28: Day #3 ceftazidime 2g q8h and day #2 vancomycin 500 q8h for suspected HCAP.  Patient febrile this AM.  Goal of Therapy:   Appropriate abx dosing, eradication of infection. Vancomycin trough level 15-20 mcg/ml  Plan:   Vancomycin trough subtherapeutic, increase to 750 mg IV q8h   Recheck trough in 2 days to rule out accumulation  Juliette Alcide, PharmD, BCPS.   Pager: [phone] 12/21/2013 6:26 PM

## 2013-12-21 NOTE — Progress Notes (Signed)
ANTICOAGULATION CONSULT NOTE  Pharmacy Consult for Lovenox Indication: Hx DVT  Allergies  Allergen Reactions  . Ativan [Lorazepam]     Pt has paradoxical effect with sedating medications.     Patient Measurements: Height:  (170.2 cm) Weight: 114 lb 10.2 oz (52 kg) IBW/kg (Calculated) : 66.1  Vital Signs: Temp: 101.5 F (38.6 C) (08/28 0800) Temp src: Oral (08/28 0800) BP: 102/54 mmHg (08/28 0800) Pulse Rate: 129 (08/28 0800)  Labs:  Recent Labs  12/19/13 0350 12/20/13 0350 12/21/13 0038 12/21/13 0039 12/21/13 0630  HGB 10.5* 9.8*  --  10.2*  --   HCT 33.7* 31.8*  --  31.6*  --   PLT 121* 124*  --  158  --   CREATININE 0.58 0.59  --  0.54  --   TROPONINI  --   --  <0.30  --  <0.30    Estimated Creatinine Clearance: 85.8 ml/min (by C-G formula based on Cr of 0.54).   Medical History: Past Medical History  Diagnosis Date  . 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 Seizure   . DVT (deep venous thrombosis)     UE, LE DVT during hospitalization  . H/O renal calculi    Assessment: 34 yoM h/o traumatic brain injury, seizures. Admitted for AMS, agitation on 8/20. 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 Pharmacy consulted 8/25 to begin lovenox until able to resume xarelto (pending head CT when patient less agitated).   Current weight 52 kg  SCr stable, CrCl 85 ml/min  H/H low but stable, Plts improved to WNL today  Goal of Therapy:  Anti-Xa level 0.6-1 units/ml 4hrs after LMWH dose given Monitor platelets by anticoagulation protocol: Yes - note low at baseline   Plan:   Continue Lovenox  ( /kg) SQ q12h.  Monitor renal function and adjust as needed.  Monitor CBC and signs/symptoms of bleeding.  Clance Boll, PharmD, BCPS Pager: [phone] 12/21/2013 9:27 AM

## 2013-12-21 NOTE — Progress Notes (Signed)
ANTIBIOTIC CONSULT NOTE   Pharmacy Consult for Ceftazidime, Vancomycin Indication: HCAP  Allergies  Allergen Reactions  . Ativan [Lorazepam]     Pt has paradoxical effect with sedating medications.     Patient Measurements: Height:  (170.2 cm) Weight: 114 lb 10.2 oz (52 kg) IBW/kg (Calculated) : 66.1  Vital Signs: Temp: 101.5 F (38.6 C) (08/28 0800) Temp src: Oral (08/28 0800) BP: 102/54 mmHg (08/28 0800) Pulse Rate: 129 (08/28 0800) Intake/Output from previous day: 08/27 0701 - 08/28 0700 In: 2180 [I.V.:230; NG/GT:950; IV Piggyback:800] Out: 2505 [Urine:2505]  Labs:  Recent Labs  12/19/13 0350 12/20/13 0350 12/21/13 0039  WBC 13.0* 6.9 7.7  HGB 10.5* 9.8* 10.2*  PLT 121* 124* 158  CREATININE 0.58 0.59 0.54   Estimated Creatinine Clearance: 85.8 ml/min (by C-G formula based on Cr of 0.54).   Medical History: Past Medical History  Diagnosis Date  . 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 Seizure   . DVT (deep venous thrombosis)     UE, LE DVT during hospitalization  . H/O renal calculi     Assessment: 59 yoM presented on 8/17 with h/o traumatic brain injury, seizures, in ED x 3 days awaiting placement. Became febrile and hypotensive with possible seizure activity and r/o pneumonia.  Received one time doses of Zosyn and Vanc for sepsis/HCAP on 8/20.  Pharmacy is now consulted to dose ceftazidime and Vancomycin for suspected HCAP.  8/20 >> Zosyn >> 8/20 8/20 >> Vanc >> 8/20 8/26 >> Ceftazidime >> 8/27 >> Vanc >>  Tmax: 101.5 WBCs: WNL Renal: SCr low  8/20 HIV: nonreactive 8/20 Urine legionella and strep pneumo Ag: negative 8/20 MRSA PCR: positive 8/20 blood x2: NGF 8/20 urine: NGF 8/26 sputum: sent 8/26 blood x2: ngtd  8/28: Day #3 ceftazidime 2g q8h and day #2 vancomycin 500 q8h for suspected HCAP.  Patient febrile this AM.  Goal of Therapy:  Appropriate abx dosing, eradication of  infection. Vancomycin trough level 15-20 mcg/ml  Plan:  1.  Vancomycin trough this evening.  Will adjust dose as needed based on result. 2.  F/u cultures, SCr, clinical course.  Clance Boll, PharmD, BCPS Pager: [phone] 12/21/2013 9:23 AM

## 2013-12-21 NOTE — Progress Notes (Signed)
PULMONARY / CRITICAL CARE MEDICINE   Name: Hector [name] MRN: [id number] DOB: December 23, 1970    ADMISSION DATE:  12/11/2013 CONSULTATION DATE:  12/13/13  REFERRING MD :  Dr. Izola Price / [facility]   CHIEF COMPLAINT:  Seizure, concern for airway protection   INITIAL PRESENTATION:  43 yo male presented to ER for involuntary commitment.  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  He was noted to having jerking movement with ? Seizure.  PCCM asked to assess with concern for airway protection/aspiration.  STUDIES:  8/17  Renal US >> normal renal US 8/17  CTA Chest >> no acute disease, mild centrilobular emphysema 8/19  CT ABD/Pelvis >> moderate amt stool in large bowel, G-tube, no acute process, bilateral non-obs up to 3mm stones 8/27   CT head >> Remote Lt sided infarction with encephalomalacia and ex vacuo dilation of Lt ventricle  SIGNIFICANT EVENTS: 8/17 Seen in ER with Rt flank pain, urine retention  8/18 Seen in ER for agitation, involuntary commitment papers placed 8/20 Admitted to SDU after shaking / agitation, concerns for seizures & ? R airspace disease 8/21 Improved agitation, brady with precedex 8/26 Started Abx for HCAP, precedex d/c'ed 8/27 Rt arm weakness ?new >> neuro reconsulted 8/28 VDRF  SUBJECTIVE: Intubated overnight.  Remains febrile.  Family reports that pt has hx of Rt upper extremity weakness.  VITAL SIGNS: Temp:  [98.7 F (37.1 C)-101.5 F (38.6 C)] 101.5 F (38.6 C) (08/28 0800) Pulse Rate:  [124-152] 129 (08/28 0800) Resp:  [14-48] 16 (08/28 0800) BP: (96-171)/(50-95) 102/54 mmHg (08/28 0800) SpO2:  [93 %-100 %] 100 % (08/28 0800) FiO2 (%):  [40 %-100 %] 40 % (08/28 0840) VENT SETTINGS: Vent Mode:  [-] PRVC FiO2 (%):  [40 %-100 %] 40 % Set Rate:  [14 bmp] 14 bmp Vt Set:  [530 mL] 530 mL PEEP:  [5 cmH20] 5 cmH20 Plateau Pressure:  [19 cmH20-28 cmH20] 19 cmH20 INTAKE / OUTPUT:  Intake/Output Summary (Last 24 hours) at 12/21/13 0941 Last data filed at  12/21/13 0900  Gross per 24 hour  Intake   2010 ml  Output   2675 ml  Net   -665 ml    PHYSICAL EXAMINATION: General: no distress Neuro: RASS -1, opens eyes with stimulation, moves Lt upper and lower extremities, Rt upper extremity flaccid HEENT: no sinus tenderness  Cardiovascular: regular, tachycardic Lungs: b/l rhonchi Abdomen: soft, non tender, PEG site clean Musculoskeletal: no edema, 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 Skin: irritation on Lt wrist  LABS:  CBC  Recent Labs Lab 12/19/13 0350 12/20/13 0350 12/21/13 0039  WBC 13.0* 6.9 7.7  HGB 10.5* 9.8* 10.2*  HCT 33.7* 31.8* 31.6*  PLT 121* 124* 158   BMET  Recent Labs Lab 12/19/13 0350 12/20/13 0350 12/21/13 0039  NA 142 142 145  K 3.6* 3.5* 4.4  CL 102 106 107  CO2 BUN CREATININE 0.58 0.59 0.54  GLUCOSE 184* 156* 149*   Electrolytes  Recent Labs Lab 12/18/13 0337 12/19/13 0350 12/20/13 0350 12/21/13 0039  CALCIUM 9.1 9.2 9.0 9.1  MG 1.9 1.8  --   --   PHOS 2.4  --   --   --     Sepsis Markers  Recent Labs Lab 12/15/13 0407 12/17/13 0343  PROCALCITON 0.12 0.21   ABG  Recent Labs Lab 12/18/13 1748 12/19/13 0359 12/20/13 2305  PHART 7.398 7.442 7.455*  PCO2ART 43.5 43.9 38.9  PO2ART 65.7* 99.2 213.0*   Liver Enzymes No results found for this basename: AST, ALT, ALKPHOS, BILITOT, ALBUMIN,  in the last 168 hours  Cardiac Enzymes  Recent Labs Lab 12/21/13 0038 12/21/13 0630  TROPONINI <0.30 <0.30   Imaging Ct Head Wo Contrast  12/20/2013   CLINICAL DATA:  Right upper extremity weakness  EXAM: CT HEAD WITHOUT CONTRAST  TECHNIQUE: Contiguous axial images were obtained from the base of the skull through the vertex without intravenous contrast.  COMPARISON:  December 17, 2013  FINDINGS: There is moderate generalized atrophy. There is enlargement of the left lateral ventricle compared to the right lateral ventricle, a finding felt to be secondary to ex vacuo  phenomenon. There is no appreciable mass, hemorrhage, extra-axial fluid collection, or midline shift. There is evidence of a prior infarct involving much of the left temporal lobe. There is also prior infarct in the posterior mid to superior left frontal lobe as well as infarction at the gray - white junction of the inferior medial left parietal lobe. There is extensive prior infarction throughout much of the posterior left corona radiata. There is no new gray-white compartment lesion. No acute appearing infarct is seen compared to recent prior study.  The bony calvarium appears intact. The patient is status post left frontal craniotomy. Bony calvarium otherwise appears intact. Mastoid air cells are clear. There is diffuse opacification of the right maxillary antrum.  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 No acute appearing infarct. No hemorrhage or mass effect. Extensive right maxillary sinus disease. Patient is status post left frontal craniotomy. There is no appreciable change compared to recent prior study .   Electronically Signed   By: Bretta Bang M.D.   On: 12/20/2013 14:01   Dg Chest Port 1 View  12/20/2013   CLINICAL DATA:  Endotracheal tube placement.  EXAM: PORTABLE CHEST - 1 VIEW  COMPARISON:  12/20/2013  FINDINGS: Endotracheal tube is been placed. Tip measures 2.6 mm cm above the carina. Probable emphysematous changes and fibrosis in the lungs. Normal heart size. Alteration of the left hemidiaphragm suggests small effusion. Diffuse bilateral perihilar interstitial infiltrates may represent edema or pneumonia. Gastrostomy tube.  IMPRESSION: Endotracheal tube tip measures 2.6 cm above the carinal. Diffuse perihilar interstitial infiltrates again demonstrated. Probable small left pleural effusion.   Electronically Signed   By: Burman Nieves M.D.   On: 12/20/2013 22:46   Dg Chest Port 1 View  12/20/2013   CLINICAL DATA:  Follow-up of pneumonia  EXAM:  PORTABLE CHEST - 1 VIEW  COMPARISON:  Portable chest x-ray of December 19, 2013  FINDINGS: The lungs are adequately inflated. There remains increased density in the left mid and upper lung and in the right mid and lower lung. The cardiopericardial silhouette is mildly enlarged though stable. The central pulmonary vascularity is prominent. The mediastinum is normal in width.  IMPRESSION: There remain bilateral interstitial infiltrates consistent with pneumonia. A small amount of pleural fluid is likely present at the lung bases. Overall there has been slight interval improvement since yesterday's study.   Electronically Signed   By: David  [place]   On: 12/20/2013 07:43     ASSESSMENT / PLAN:  NEUROLOGY Severe agitated delirium. 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 Expressive aphasia. Hx of ETOH. Rt upper extremity weakness ?if this is chronic. P:   RASS goal: -1 Hold home regimen:  Amantadine, bromocriptine, remeron Continue valproate, Risperdal  q8h Changed  klonopin to 1 mg qhs on 8/27 PRN fentanyl Might need psych to re-assess when medically more stable F/u neurology recommendations  PULMONARY Acute hypoxic respiratory 2nd to HCAP developed 8/26. Atelectasis. Hx of tobacco abuse. Mild emphysema changes noted on CT chest 12/10/13. P: Full vent support for now Bronchial hygiene Change nicotine to 7 mg on 8/28 F/u CXR  CARDIOLOGY Sinus tachycardia in setting of acute agitated delirium, fever, and hypoxia. Hx DVT - upper & lower extremity DVT during admission in 07/2013, on xarelto at baseline. P:  Monitor hemodynamics Will have pharmacy start full dose lovenox in place of xarelto for now Add lopressor 12.5 mg bid on 8/28  RENAL Mild Rhabdo in setting of agitation >> resolved. Hypernatremia >> resolved. Hypokalemia. P:   Continue free water Monitor renal fx, urine outpt F/u and replace electrolytes as  needed  GASTROINTESTINAL Dysphagia s/p PEG. Protein calorie malnutrition. P:   Continue tube feeds Protonix for SUP  HEMATOLOGIC Anemia of chronic disease. Mild thrombocytopenia. Leukocytosis in setting of HCAP >> improved 8/26. P:  Monitor CBC  INFECTIOUS Presumed HCAP developed 8/26. P:   Day 3 fortaz, started 8/26 Day 2 vancomycin, started 8/27  Blood 8/26 >>  Sputum 8/27 >>  ENDOCRINE No acute issues. P:   Monitor blood sugar on BMET  TODAY'S SUMMARY:   Intubated overnight in setting of HCAP, weak cough.  Attempted to contact family over phone >> no answer.  CC time 35 minutes.  Coralyn Helling, MD [facility] Pulmonary/Critical Care 12/21/2013, 9:41 AM Pager:  [phone] After 3pm call: [phone]

## 2013-12-21 NOTE — Progress Notes (Signed)
CSW continuing to follow.  CSW reviewed chart and noted that pt intubated last night.  CSW signing off as disposition planning not appropriate at this time.  Please re-consult social work when appropriate.  Loletta Specter, MSW, LCSW Clinical Social Work [phone]

## 2013-12-22 ENCOUNTER — Inpatient Hospital Stay (HOSPITAL_COMMUNITY): Payer: Medicaid Other

## 2013-12-22 LAB — BASIC METABOLIC PANEL
Anion gap: 10 (ref 5–15)
BUN: 12 mg/dL (ref 6–23)
CALCIUM: 9 mg/dL (ref 8.4–10.5)
CO2: 28 mEq/L (ref 19–32)
Chloride: 104 mEq/L (ref 96–112)
Creatinine, Ser: 0.56 mg/dL (ref 0.50–1.35)
GFR calc Af Amer: >90 mL/min (ref >90)
Glucose, Bld: 155 mg/dL — ABNORMAL HIGH (ref 70–99)
Potassium: 4 mEq/L (ref 3.7–5.3)
Sodium: 142 mEq/L (ref 137–147)

## 2013-12-22 LAB — CBC
HEMATOCRIT: 29.3 % — AB (ref 39.0–52.0)
HEMOGLOBIN: 9.6 g/dL — AB (ref 13.0–17.0)
MCH: 27.7 pg (ref 26.0–34.0)
MCHC: 32.8 g/dL (ref 30.0–36.0)
MCV: 84.4 fL (ref 78.0–100.0)
Platelets: 158 10*3/uL (ref 150–400)
RBC: 3.47 MIL/uL — ABNORMAL LOW (ref 4.22–5.81)
RDW: 16.9 % — ABNORMAL HIGH (ref 11.5–15.5)
WBC: 6.5 10*3/uL (ref 4.0–10.5)

## 2013-12-22 LAB — CULTURE, RESPIRATORY W GRAM STAIN: Culture: NORMAL

## 2013-12-22 LAB — HEPATIC FUNCTION PANEL
ALK PHOS: 230 U/L — AB (ref 39–117)
ALT: 16 U/L (ref 0–53)
AST: 25 U/L (ref 0–37)
Albumin: 1.9 g/dL — ABNORMAL LOW (ref 3.5–5.2)
Bilirubin, Direct: <0.2 mg/dL (ref 0.0–0.3)
Total Bilirubin: <0.2 mg/dL — ABNORMAL LOW (ref 0.3–1.2)
Total Protein: 6.8 g/dL (ref 6.0–8.3)

## 2013-12-22 LAB — CULTURE, RESPIRATORY

## 2013-12-22 LAB — VANCOMYCIN, TROUGH: Vancomycin Tr: 20.1 ug/mL — ABNORMAL HIGH (ref 10.0–20.0)

## 2013-12-22 MED ORDER — QUETIAPINE FUMARATE 100 MG PO TABS
100.0000 mg | ORAL_TABLET | Freq: Three times a day (TID) | ORAL | Status: DC
  Administered 2013-12-22 – 2014-01-03 (×37): 100 mg via ORAL
  Filled 2013-12-22 (×40): qty 1

## 2013-12-22 MED ORDER — FENTANYL CITRATE 0.05 MG/ML IJ SOLN
25.0000 ug | INTRAMUSCULAR | Status: DC | PRN
  Administered 2013-12-22: 50 ug via INTRAVENOUS
  Administered 2013-12-23 (×2): 75 ug via INTRAVENOUS
  Administered 2013-12-23: 25 ug via INTRAVENOUS
  Filled 2013-12-22 (×4): qty 2

## 2013-12-22 NOTE — Progress Notes (Signed)
PHARMACY - VANCOMYCIN NOTE (brief)  Day #3 Vancomycin and Day #4 Ceftazidime for presumed HCAP  Vancomycin trough = 20.1 Renal function stable (Scr = 0.56)  Assessment:  Vancomycin trough at upper end of desired goal of 15-20 mcg/ml  Plan:  Continue Ceftazidime            Continue Vancomycin  IV q8h             Follow cultures/sensitivities            Recheck vancomycin trough level as needed  Terrilee Files, PharmD 12/22/13 @ 22:47

## 2013-12-22 NOTE — Progress Notes (Signed)
ANTIBIOTIC CONSULT NOTE   Pharmacy Consult for Ceftazidime, Vancomycin Indication: HCAP  Allergies  Allergen Reactions  . Ativan [Lorazepam]     Pt has paradoxical effect with sedating medications.     Patient Measurements: Height:  (170.2 cm) Weight: 116 lb 13.5 oz (53 kg) IBW/kg (Calculated) : 66.1  Vital Signs: Temp: 98.5 F (36.9 C) (08/29 0800) Temp src: Oral (08/29 0800) BP: 126/74 mmHg (08/29 0900) Pulse Rate: 89 (08/29 0900) Intake/Output from previous day: 08/28 0701 - 08/29 0700 In: 2433 [I.V.:243; NG/GT:1600; IV Piggyback:550] Out: 2145 [Urine:1255; Drains:890]  Labs:  Recent Labs  12/20/13 0350 12/21/13 0039 12/22/13 0319  WBC 6.9 7.7 6.5  HGB 9.8* 10.2* 9.6*  PLT 124* 158 158  CREATININE 0.59 0.54 0.56   Estimated Creatinine Clearance: 87.4 ml/min (by C-G formula based on Cr of 0.56).   Medical History: Past Medical History  Diagnosis Date  . 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 Seizure   . DVT (deep venous thrombosis)     UE, LE DVT during hospitalization  . H/O renal calculi     Assessment: 64 yoM presented on 8/17 with h/o traumatic brain injury, seizures, in ED x 3 days awaiting placement. Became febrile and hypotensive with possible seizure activity and r/o pneumonia.  Received one time doses of Zosyn and Vanc for sepsis/HCAP on 8/20.  Pharmacy is now consulted to dose ceftazidime and Vancomycin for suspected HCAP.  8/20 >> Zosyn >> 8/20 8/20 >> Vanc >> 8/20 8/26 >> Ceftazidime >> 8/27 >> Vanc >>  Tmax: 101.5, Tc AF WBCs: WNL Renal: SCr low/stable  8/20 HIV: nonreactive 8/20 Urine legionella and strep pneumo Ag: negative 8/20 MRSA PCR: positive 8/20 blood x2: NGF 8/20 urine: NGF 8/26 sputum: no growth 1 day 8/26 blood x2: ngtd  8/28 1700 VT: 11 mcg/ml on  q8h prior to 5th dose, dose adjusted to 750 mg q8h  Today (8/29) is day #4 ceftazidime 2g q8h and day #3 vancomycin 750 q8h  for suspected HCAP.  Renal function stable.  Will obtain another vanc trough level tonight at steady state.  Goal of Therapy:  Appropriate abx dosing, eradication of infection. Vancomycin trough level 15-20 mcg/ml  Plan:  1.  Vancomycin trough tonight.  Will adjust dose based on level as needed. 2.  No dose adjustment to ceftazidime.  Clance Boll, PharmD, BCPS Pager: [phone] 12/22/2013 10:35 AM

## 2013-12-22 NOTE — Progress Notes (Signed)
PULMONARY / CRITICAL CARE MEDICINE   Name: Hector [name] MRN: [id number] DOB: [birth year]    ADMISSION DATE:  12/11/2013 CONSULTATION DATE:  12/13/13  REFERRING MD :  Dr. Izola Price / [facility]   CHIEF COMPLAINT:  Seizure, concern for airway protection   INITIAL PRESENTATION: 43 yo male presented to ER for involuntary commitment.  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  He was noted to having jerking movement / possible seizures.  PCCM asked to assess with concern for airway protection/aspiration.  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 R airspace disease 8/21 Improved agitation, brady with precedex 8/26 Started Abx for HCAP, precedex d/c'ed 8/27 R arm weakness ?new >> neuro reconsulted 8/28 VDRF  INTERVAL HISTORY:  No acute overnight issues  VITAL SIGNS: Temp:  [98 F (36.7 C)-99.8 F (37.7 C)] 98.5 F (36.9 C) (08/29 0800) Pulse Rate:  [81-131] 104 (08/29 1043) Resp:  [14-28] 14 (08/29 0900) BP: (109-139)/(52-77) 113/66 mmHg (08/29 1043) SpO2:  [100 %] 100 % (08/29 0900) FiO2 (%):  [40 %] 40 % (08/29 0806) Weight:  [53 kg (116 lb 13.5 oz)] 53 kg (116 lb 13.5 oz) (08/28 2333)  VENT SETTINGS: Vent Mode:  [-] PRVC FiO2 (%):  [40 %] 40 % Set Rate:  [14 bmp] 14 bmp Vt Set:  [530 mL] 530 mL PEEP:  [5 cmH20] 5 cmH20 Plateau Pressure:  [16 cmH20-18 cmH20] 17 cmH20  INTAKE / OUTPUT: Intake/Output     08/28 0701 - 08/29 0700 08/29 0701 - 08/30 0700   I.V. 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 Skin: Intact  LABS:  CBC  Recent Labs Lab 12/20/13 0350 12/21/13 0039 12/22/13 0319  WBC 6.9 7.7 6.5  HGB 9.8* 10.2* 9.6*  HCT 31.8* 31.6* 29.3*  PLT 124* 158 158   BMET  Recent Labs Lab 12/20/13 0350 12/21/13 0039 12/22/13 0319  NA 142 145 142  K 3.5* 4.4 4.0  CL 106 107 104  CO2 BUN CREATININE 0.59 0.54 0.56  GLUCOSE 156* 149* 155*   Electrolytes  Recent Labs Lab 12/18/13 0337 12/19/13 0350 12/20/13 0350 12/21/13 0039 12/22/13 0319  CALCIUM 9.1 9.2 9.0 9.1 9.0  MG 1.9 1.8  --   --   --   PHOS 2.4  --   --   --   --  Sepsis Markers  Recent Labs Lab 12/17/13 0343  PROCALCITON 0.21   ABG  Recent Labs Lab 12/18/13 1748 12/19/13 0359 12/20/13 2305  PHART 7.398 7.442 7.455*  PCO2ART 43.5 43.9 38.9  PO2ART 65.7* 99.2 213.0*   Liver Enzymes  Recent Labs Lab 12/22/13 0319  AST 25  ALT 16  ALKPHOS 230*  BILITOT <0.2*  ALBUMIN 1.9*   Cardiac Enzymes  Recent Labs Lab 12/21/13 0038 12/21/13 0630 12/21/13 1211  TROPONINI <0.30 <0.30 <0.30   IMAGING:  Dg Chest Port 1 View  12/21/2013   CLINICAL DATA:  Pneumonia, atelectasis and pleural effusion.  EXAM: PORTABLE CHEST - 1 VIEW  COMPARISON:  12/20/2013.  FINDINGS: There is increased irregular interstitial opacities and hazy airspace opacity in the lung bases, most likely due to atelectasis. Other areas of ill-defined interstitial densities throughout both  lungs are stable. Mild hazy airspace opacity in the medial right apex and right lower lobe inferiorly adjacent to the minor fissure are stable. No pneumothorax. Minimal pleural effusions are suspected.  Cardiac silhouette is normal in size. No mediastinal or hilar masses.  Endotracheal tube is stable and well positioned.  IMPRESSION: 1. Slight worsening of lung aeration at the lung bases most likely due to increased atelectasis. 2. Other findings described previously including diffuse bilateral irregular interstitial densities and areas of hazy airspace opacity are without change. Findings suggest pulmonary edema superimposed on areas of chronic interstitial scarring. Interstitial infiltrates with areas of hazy airspace multifocal pneumonia is also possible.   Electronically Signed   By: Amie Portland M.D.   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 P:   Fortaz 8/26 Vancomycin 8/27 Follow blood / sputum cx   ENDOCRINE A: No acute issues. 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 Parkinson symptoms? D/c  Risperdal ( as may exacerbate Parkinson symptoms )   Start Seroquel  Valproate, Klonopin PRN fentanyl Might need Psychiatry to re-assess when medically stable Neurology following, no further imaging recommended  I have personally obtained history, examined patient, evaluated and interpreted laboratory and imaging results, reviewed medical records, formulated assessment / plan and placed orders.  CRITICAL CARE:  The patient is critically ill with multiple organ systems failure and requires high complexity decision making for assessment and support, frequent evaluation and titration of therapies, application of advanced monitoring technologies and extensive interpretation of multiple databases. Critical Care Time devoted to patient care services described in this note is 35 minutes.   Lonia Farber, MD Pulmonary and Critical Care Medicine [facility] Pager: [phone]  12/22/2013, 11:17 AM

## 2013-12-23 LAB — CBC
HEMATOCRIT: 32.1 % — AB (ref 39.0–52.0)
Hemoglobin: 10.5 g/dL — ABNORMAL LOW (ref 13.0–17.0)
MCH: 27.3 pg (ref 26.0–34.0)
MCHC: 32.7 g/dL (ref 30.0–36.0)
MCV: 83.4 fL (ref 78.0–100.0)
Platelets: 185 10*3/uL (ref 150–400)
RBC: 3.85 MIL/uL — ABNORMAL LOW (ref 4.22–5.81)
RDW: 16.4 % — AB (ref 11.5–15.5)
WBC: 6.6 10*3/uL (ref 4.0–10.5)

## 2013-12-23 LAB — BASIC METABOLIC PANEL
Anion gap: 12 (ref 5–15)
BUN: 11 mg/dL (ref 6–23)
CO2: 28 mEq/L (ref 19–32)
Calcium: 9 mg/dL (ref 8.4–10.5)
Chloride: 100 mEq/L (ref 96–112)
Creatinine, Ser: 0.52 mg/dL (ref 0.50–1.35)
GFR calc Af Amer: >90 mL/min (ref >90)
GFR calc non Af Amer: >90 mL/min (ref >90)
Glucose, Bld: 164 mg/dL — ABNORMAL HIGH (ref 70–99)
POTASSIUM: 4.1 meq/L (ref 3.7–5.3)
SODIUM: 140 meq/L (ref 137–147)

## 2013-12-23 MED ORDER — CETYLPYRIDINIUM CHLORIDE 0.05 % MT LIQD
7.0000 mL | Freq: Two times a day (BID) | OROMUCOSAL | Status: DC
  Administered 2013-12-23 – 2013-12-24 (×3): 7 mL via OROMUCOSAL

## 2013-12-23 MED ORDER — FENTANYL CITRATE 0.05 MG/ML IJ SOLN
25.0000 ug | INTRAMUSCULAR | Status: DC | PRN
  Administered 2013-12-24: 50 ug via INTRAVENOUS
  Filled 2013-12-23: qty 2

## 2013-12-23 MED ORDER — LORAZEPAM 2 MG/ML IJ SOLN
2.0000 mg | INTRAMUSCULAR | Status: DC | PRN
  Administered 2013-12-24: 2 mg via INTRAVENOUS
  Filled 2013-12-23: qty 1

## 2013-12-23 MED ORDER — LORAZEPAM 2 MG/ML IJ SOLN
INTRAMUSCULAR | Status: AC
  Administered 2013-12-23: 2 mg
  Filled 2013-12-23: qty 1

## 2013-12-23 MED ORDER — CLONAZEPAM 1 MG PO TABS
1.0000 mg | ORAL_TABLET | Freq: Every day | ORAL | Status: DC
  Administered 2013-12-24 – 2014-01-02 (×10): 1 mg
  Filled 2013-12-23 (×7): qty 1
  Filled 2013-12-23: qty 2
  Filled 2013-12-23 (×2): qty 1

## 2013-12-23 MED ORDER — FENTANYL CITRATE 0.05 MG/ML IJ SOLN
INTRAMUSCULAR | Status: AC
  Administered 2013-12-23: 50 ug
  Filled 2013-12-23: qty 2

## 2013-12-23 MED ORDER — CLONAZEPAM 1 MG PO TABS
1.0000 mg | ORAL_TABLET | Freq: Two times a day (BID) | ORAL | Status: DC | PRN
  Administered 2013-12-23: 1 mg

## 2013-12-23 MED ORDER — CHLORHEXIDINE GLUCONATE 0.12 % MT SOLN
15.0000 mL | Freq: Two times a day (BID) | OROMUCOSAL | Status: DC
  Administered 2013-12-23 – 2014-01-03 (×20): 15 mL via OROMUCOSAL
  Filled 2013-12-23 (×24): qty 15

## 2013-12-23 NOTE — Progress Notes (Signed)
Pt has been agitated during this evening throwing legs aver the side of the bed, attempting to get out of bed and pulling at IVs and mittens.  With agitation, patient has had heart rate in the 140-160s and BP 162/89.  RR 40-50 on 50% venti mask.  Elink notified and viewed patient.  Orders received.  Currently patient has become more calm.  HR 120s RR 33.

## 2013-12-23 NOTE — Procedures (Signed)
Extubation Procedure Note  Patient Details:   Name: Hector [name] DOB: 12-15-1970 MRN: [id number]   Airway Documentation:   Suctioned ETT & oral with miminal secretions. Extubated per MD order.  Evaluation  O2 sats: stable throughout Complications: No apparent complications Patient did tolerate procedure well. Bilateral Breath Sounds: Diminished Suctioning: Airway Yes   Kendall Flack Royal 12/23/2013, 9:44 AM

## 2013-12-23 NOTE — Progress Notes (Signed)
Pt NT suctioned per MD order. RN at bedside. Suctioned for large amt of thick clear secretions. Pt tolerated well.

## 2013-12-23 NOTE — Progress Notes (Signed)
Pt again NT suctioned for moderate amt of clear thick secretions, due to RR > 50. Pt tolerated well. RN at bedside. RR < 25 at this time.

## 2013-12-23 NOTE — Progress Notes (Signed)
PULMONARY / CRITICAL CARE MEDICINE   Name: Hector [name] MRN: [id number] DOB: 03/17/1971    ADMISSION DATE:  12/11/2013 CONSULTATION DATE:  12/13/13  REFERRING MD :  Dr. Izola Price / [facility]   CHIEF COMPLAINT:  Seizure, concern for airway protection   INITIAL PRESENTATION: 43 yo male presented to ER for involuntary commitment.  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  He was noted to having jerking movement / possible seizures.  PCCM asked to assess with concern for airway protection/aspiration.  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 R airspace disease 8/21 Improved agitation, brady with precedex 8/26 Started Abx for HCAP, precedex d/c'ed 8/27 R arm weakness ?new >> neuro reconsulted 8/28 VDRF 8/30 Extubated  INTERVAL HISTORY:  Doing well on CPAP 5 PS 5  VITAL SIGNS: Temp:  [97.5 F (36.4 C)-99.1 F (37.3 C)] 97.7 F (36.5 C) (08/30 0418) Pulse Rate:  [77-111] 82 (08/30 0900) Resp:  [12-30] 22 (08/30 0900) BP: (95-138)/(56-79) 112/62 mmHg (08/30 0900) SpO2:  [99 %-100 %] 100 % (08/30 0900) FiO2 (%):  [30 %-40 %] 30 % (08/30 0839)  VENT SETTINGS: Vent Mode:  [-] PSV;CPAP FiO2 (%):  [30 %-40 %] 30 % Set Rate:  [14 bmp] 14 bmp Vt Set:  [530 mL] 530 mL PEEP:  [5 cmH20] 5 cmH20 Pressure Support:  [5 cmH20] 5 cmH20 Plateau Pressure:  [16 cmH20-21 cmH20] 16 cmH20  INTAKE / OUTPUT: Intake/Output     08/29 0701 - 08/30 0700 08/30 0701 - 08/31 0700   I.V. 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  Sepsis Markers  Recent Labs Lab 12/17/13 0343  PROCALCITON 0.21   ABG  Recent Labs Lab 12/18/13 1748 12/19/13 0359 12/20/13 2305  PHART 7.398 7.442 7.455*  PCO2ART 43.5 43.9 38.9  PO2ART 65.7* 99.2 213.0*   Liver Enzymes  Recent Labs Lab 12/22/13 0319  AST 25  ALT 16  ALKPHOS 230*  BILITOT <0.2*  ALBUMIN 1.9*   Cardiac Enzymes  Recent Labs Lab 12/21/13 0038 12/21/13 0630 12/21/13 1211  TROPONINI <0.30 <0.30 <0.30   IMAGING:  Dg Chest Port 1 View  12/22/2013   CLINICAL DATA:  Followup pneumonia  EXAM: PORTABLE CHEST - 1 VIEW  COMPARISON:  12/21/2013  FINDINGS: Cardiac shadow is within normal limits. Persistent left basilar changes are noted. Some mild clearing is noted in the right mid lung. Endotracheal tube is again noted 2.5 cm  above the carina. No bony abnormality is seen.  IMPRESSION: Improved aeration in the right lung. Persistent changes in the left base are noted.   Electronically Signed   By: Alcide Clever M.D.   On: 12/22/2013 07:10    ASSESSMENT / PLAN:  PULMONARY A: Acute hypoxemixc respiratory failre HCAP Tobacco use disorder P: Extubate Goal SpO2>92 Trend CXR Nicotine patch  CARDIOLOGY A: Sinus tachycardia H/o upper / lower extremity DVT on Xarelto preadmission P:  Goal MAP>65 Anticoagulation as below ASA Metoprolol  RENAL A: No active issues P:   Trend BMP Free water  GASTROINTESTINAL A: Dysphagia, s/p PEG Protein calorie malnutrition GI Px is not indicated P:   TF D/c Protonix  HEMATOLOGIC A: Anemia of chronic disease Anticoagulation for h/o DVT P:  Monitor CBC Lovenox ( therapeutic dosing )  INFECTIOUS A: Presumed HCAP, developed 8/26. P:   Fortaz 8/26 Vancomycin 8/27 Follow blood cx   ENDOCRINE A: No acute issues. 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 Parkinson symptoms? Risperdal d/c'd ( as may exacerbate Parkinson symptoms )   Seroquel 8/29 Valproate, Klonopin as preadmission PRN fentanyl Might need Psychiatry to re-assess when medically stable Neurology following, no further imaging recommended  I have personally obtained history, examined patient, evaluated and interpreted laboratory and imaging results, reviewed medical records, formulated assessment / plan and placed orders.  CRITICAL CARE:  The patient is critically ill with multiple organ systems failure and requires high complexity decision making for assessment and support, frequent evaluation and titration of therapies, application of advanced monitoring technologies and  extensive interpretation of multiple databases. Critical Care Time devoted to patient care services described in this note is 35 minutes.   Lonia Farber, MD Pulmonary and Critical Care Medicine [facility] Pager: [phone]  12/23/2013, 9:28 AM

## 2013-12-23 NOTE — Progress Notes (Signed)
eLink Physician-Brief Progress Note Patient Name: Hector [name] DOB: [birth year] MRN: [id number]   Date of Service  12/23/2013  HPI/Events of Note  Patient extubated and now with significant agitation.  Nurses having difficulty keeping patient in bed and safe.  He is on 50% venti mask.  eICU Interventions  Change clonazepam to 1 mg po bid prn and will also add some fentanyl for pain.       Intervention Category Minor Interventions: Agitation / anxiety - evaluation and management  Henry Russel, P 12/23/2013, 9:00 PM

## 2013-12-24 ENCOUNTER — Inpatient Hospital Stay (HOSPITAL_COMMUNITY): Payer: Medicaid Other

## 2013-12-24 DIAGNOSIS — R404 Transient alteration of awareness: Secondary | ICD-10-CM

## 2013-12-24 LAB — BLOOD GAS, ARTERIAL
Acid-Base Excess: 1.7 mmol/L (ref 0.0–2.0)
Bicarbonate: 26.3 mEq/L — ABNORMAL HIGH (ref 20.0–24.0)
Drawn by: 275531
FIO2: 100 %
O2 Saturation: 99.7 %
PCO2 ART: 43.7 mmHg (ref 35.0–45.0)
PEEP: 5 cmH2O
PO2 ART: 442 mmHg — AB (ref 80.0–100.0)
Patient temperature: 98.6
RATE: 14 resp/min
TCO2: 23.9 mmol/L (ref 0–100)
VT: 500 mL
pH, Arterial: 7.397 (ref 7.350–7.450)

## 2013-12-24 LAB — BASIC METABOLIC PANEL
Anion gap: 12 (ref 5–15)
BUN: 11 mg/dL (ref 6–23)
CO2: 27 meq/L (ref 19–32)
Calcium: 9.3 mg/dL (ref 8.4–10.5)
Chloride: 101 mEq/L (ref 96–112)
Creatinine, Ser: 0.51 mg/dL (ref 0.50–1.35)
Glucose, Bld: 152 mg/dL — ABNORMAL HIGH (ref 70–99)
Potassium: 3.9 mEq/L (ref 3.7–5.3)
Sodium: 140 mEq/L (ref 137–147)

## 2013-12-24 LAB — CBC
HCT: 34.8 % — ABNORMAL LOW (ref 39.0–52.0)
Hemoglobin: 11.2 g/dL — ABNORMAL LOW (ref 13.0–17.0)
MCH: 27 pg (ref 26.0–34.0)
MCHC: 32.2 g/dL (ref 30.0–36.0)
MCV: 83.9 fL (ref 78.0–100.0)
PLATELETS: 220 10*3/uL (ref 150–400)
RBC: 4.15 MIL/uL — AB (ref 4.22–5.81)
RDW: 16.4 % — ABNORMAL HIGH (ref 11.5–15.5)
WBC: 9.5 10*3/uL (ref 4.0–10.5)

## 2013-12-24 LAB — PROTIME-INR
INR: 1.12 (ref 0.00–1.49)
Prothrombin Time: 14.4 seconds (ref 11.6–15.2)

## 2013-12-24 LAB — APTT: APTT: 42 s — AB (ref 24–37)

## 2013-12-24 MED ORDER — SODIUM CHLORIDE 0.9 % IV BOLUS (SEPSIS)
750.0000 mL | Freq: Once | INTRAVENOUS | Status: AC
  Administered 2013-12-24: 750 mL via INTRAVENOUS

## 2013-12-24 MED ORDER — LIDOCAINE HCL (CARDIAC) 20 MG/ML IV SOLN
INTRAVENOUS | Status: AC
  Filled 2013-12-24: qty 5

## 2013-12-24 MED ORDER — ETOMIDATE 2 MG/ML IV SOLN
40.0000 mg | Freq: Once | INTRAVENOUS | Status: DC
  Filled 2013-12-24: qty 20

## 2013-12-24 MED ORDER — VECURONIUM BROMIDE 10 MG IV SOLR: 10.0000 mg | Freq: Once | INTRAVENOUS | Status: DC

## 2013-12-24 MED ORDER — DEXMEDETOMIDINE HCL IN NACL 200 MCG/50ML IV SOLN
0.4000 ug/kg/h | INTRAVENOUS | Status: DC
  Administered 2013-12-24: 0.8 ug/kg/h via INTRAVENOUS
  Administered 2013-12-24: 0.5 ug/kg/h via INTRAVENOUS
  Filled 2013-12-24 (×2): qty 50

## 2013-12-24 MED ORDER — ETOMIDATE 2 MG/ML IV SOLN
INTRAVENOUS | Status: AC
  Administered 2013-12-24: 20 mg
  Filled 2013-12-24: qty 20

## 2013-12-24 MED ORDER — LORAZEPAM 2 MG/ML IJ SOLN
2.0000 mg | INTRAMUSCULAR | Status: DC | PRN
  Administered 2013-12-24 – 2014-01-03 (×4): 2 mg via INTRAVENOUS
  Filled 2013-12-24 (×4): qty 1

## 2013-12-24 MED ORDER — SODIUM CHLORIDE 0.9 % IV BOLUS (SEPSIS)
500.0000 mL | Freq: Once | INTRAVENOUS | Status: AC
  Administered 2013-12-24: 500 mL via INTRAVENOUS

## 2013-12-24 MED ORDER — MIDAZOLAM HCL 2 MG/2ML IJ SOLN: 4.0000 mg | Freq: Once | INTRAMUSCULAR | Status: DC

## 2013-12-24 MED ORDER — SUCCINYLCHOLINE CHLORIDE 20 MG/ML IJ SOLN
INTRAMUSCULAR | Status: AC
  Filled 2013-12-24: qty 1

## 2013-12-24 MED ORDER — FENTANYL CITRATE 0.05 MG/ML IJ SOLN
25.0000 ug | INTRAMUSCULAR | Status: DC | PRN
  Administered 2013-12-24 (×2): 50 ug via INTRAVENOUS
  Filled 2013-12-24 (×2): qty 2

## 2013-12-24 MED ORDER — MIDAZOLAM HCL 2 MG/2ML IJ SOLN
INTRAMUSCULAR | Status: AC
  Administered 2013-12-24: 2 mg
  Filled 2013-12-24: qty 2

## 2013-12-24 MED ORDER — FENTANYL CITRATE 0.05 MG/ML IJ SOLN: 200.0000 ug | Freq: Once | INTRAMUSCULAR | Status: DC

## 2013-12-24 MED ORDER — FENTANYL CITRATE 0.05 MG/ML IJ SOLN
INTRAMUSCULAR | Status: AC
  Administered 2013-12-24: 100 ug
  Filled 2013-12-24: qty 2

## 2013-12-24 MED ORDER — ROCURONIUM BROMIDE 50 MG/5ML IV SOLN
INTRAVENOUS | Status: AC
  Filled 2013-12-24: qty 2

## 2013-12-24 NOTE — Progress Notes (Signed)
Attempted to contact family several times, based on numbers from the patient's facesheet, which was unsuccessful.    Patient's mother is currently admitted to [facility] ICU. I visited the patient's mother, but his father was not present. Patient's mother asked about patient, she was updated on his clinical status, recent intubation, and current need for a tracheostomy;  All of these she was in agreement with, but requested that I also update her husband (the patient's father); tried on two attempts to establish contact with patient's father but was unsuccessful. Dr. Tyson Alias will also attempt to contact patient's father.  [name], MD [place] [facility] Pager [phone] On Call Pager [phone]

## 2013-12-24 NOTE — Progress Notes (Signed)
eLink Physician-Brief Progress Note Patient Name: Hector [name] DOB: 1970/09/27 MRN: [id number]   Date of Service  12/24/2013  HPI/Events of Note  Pt hypotensive   eICU Interventions  NS bolus given     Intervention Category Major Interventions: Hypertension - evaluation and management  Shan Levans 12/24/2013, 7:44 PM

## 2013-12-24 NOTE — Progress Notes (Signed)
PULMONARY / CRITICAL CARE MEDICINE   Name: Hector [name] MRN: [id number] DOB: [birth year]    ADMISSION DATE:  12/11/2013 CONSULTATION DATE:  12/13/13  REFERRING MD :  Dr. Izola Price / [facility]   CHIEF COMPLAINT:  Seizure, concern for airway protection   INITIAL PRESENTATION: 43 yo male presented to ER for involuntary commitment.  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  He was noted to having jerking movement / possible seizures.  PCCM asked to assess with concern for airway protection/aspiration.  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 R airspace disease 8/21 Improved agitation, brady with precedex 8/26 Started Abx for HCAP, precedex d/c'ed 8/27 R arm weakness ?new >> neuro reconsulted 8/28 VDRF 8/30 Extubated 8/31 agitated and rhonchus  8/31 intubated  INTERVAL HISTORY:  Extubated but agitated amd having problems expectorating secretions. reintubated   VITAL SIGNS: Temp:  [97.6 F (36.4 C)-98.9 F (37.2 C)] 97.9 F (36.6 C) (08/31 0830) Pulse Rate:  [87-145] 109 (08/31 0700) Resp:  [9-44] 22 (08/31 0700) BP: (110-162)/(62-110) 119/78 mmHg (08/31 0700) SpO2:  [91 %-100 %] 100 % (08/31 0700) FiO2 (%):  [50 %] 50 % (08/31 0418) Weight:  [110 lb 14.3 oz (50.3 kg)] 110 lb 14.3 oz (50.3 kg) (08/31 0400)  VENT SETTINGS: Vent Mode:  [-]  FiO2 (%):  [50 %] 50 % V mask  INTAKE / OUTPUT: Intake/Output     08/30 0701 - 08/31 0700 08/31 0701 - 09/01 0700   I.V. (mL/kg) 220 (4.4)     Other 110    NG/GT 1250    IV Piggyback 600    Total Intake(mL/kg) 2180 (43.3)    Urine (mL/kg/hr) 3155 (2.6)    Total Output 3155     Net -975          Stool Occurrence 2 x     PHYSICAL EXAMINATION: General: No acute distress, agiatated Neuro: Follows some commands, agitated has mitts on, all over the bed HEENT: No JVD Cardiovascular: Regular, tachycardic Lungs: Bilateral air entry, rhonchus, retaining secretions . 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  Sepsis Markers No results found for this basename: LATICACIDVEN, PROCALCITON, O2SATVEN,  in the last 168 hours ABG  Recent Labs Lab 12/18/13 1748 12/19/13 0359 12/20/13 2305  PHART 7.398 7.442 7.455*  PCO2ART 43.5 43.9 38.9  PO2ART 65.7* 99.2 213.0*   Liver Enzymes  Recent Labs Lab 12/22/13 0319  AST 25  ALT 16  ALKPHOS 230*  BILITOT <0.2*  ALBUMIN 1.9*   Cardiac Enzymes  Recent Labs Lab 12/21/13 0038 12/21/13 0630 12/21/13 1211  TROPONINI <0.30 <0.30 <0.30   IMAGING:  No results found.  ASSESSMENT / PLAN:  PULMONARY A: Acute hypoxemixc respiratory failre HCAP Tobacco use disorder P: Extubated 8/30 Goal SpO2>92 Trend CXR none for 8/31 Reintubated  8/31  CARDIOLOGY A: Sinus tachycardia H/o upper / lower extremity DVT on Xarelto preadmission P:  Goal MAP>65 Anticoagulation as below ASA Metoprolol  RENAL A: No active issues P:   Trend BMP Free water  GASTROINTESTINAL A: Dysphagia, s/p PEG Protein calorie malnutrition GI Px is not indicated P:   TF   HEMATOLOGIC A: Anemia of chronic disease Anticoagulation for h/o DVT P:  Monitor CBC Lovenox ( therapeutic dosing )  INFECTIOUS A: Presumed HCAP, developed 8/26. P:   [name] 8/26>> Vancomycin 8/27>>  blood cx 8/26>> 8/27 sputum>>nl flora   ENDOCRINE A: No acute issues. 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 Parkinson symptoms? Seroquel 8/29 Valproate, Klonopin as preadmission PRN fentanyl Might need Psychiatry to re-assess when medically stable Neurology following, no further imaging recommended  [name] Minor ACNP [facility] PCCM Pager [phone] till 3 pm If no answer page [phone] 12/24/2013, 9:25 AM   I have personally obtained history, examined patient, evaluated and interpreted laboratory and imaging results, reviewed medical records, formulated assessment / plan and placed orders.  CRITICAL CARE:  The patient is critically ill with multiple organ systems failure and requires high complexity decision making for assessment and support, frequent evaluation and titration of therapies, application of advanced monitoring technologies and extensive interpretation of multiple databases. Critical Care Time devoted to patient care services described in this note is 35 minutes.   Hart Rochester, MD Pulmonary and Critical Care Medicine [facility] Pager: [phone]  12/24/2013, 9:19 AM

## 2013-12-24 NOTE — Procedures (Signed)
Intubation Procedure Note JERMIAH SODERMAN [id number] 08/07/1970  Procedure: Intubation Indications: Respiratory insufficiency  Procedure Details Consent: Unable to obtain consent because of altered level of consciousness. Time Out: Verified patient identification, verified procedure, site/side was marked, verified correct patient position, special equipment/implants available, medications/allergies/relevent history reviewed, required imaging and test results available.  Performed  Maximum sterile technique was used including cap, gloves, gown, hand hygiene and mask.  MAC and 4    Evaluation Hemodynamic Status: BP stable throughout; O2 sats: stable throughout Patient's Current Condition: stable Complications: No apparent complications Patient did tolerate procedure well. Chest X-ray ordered to verify placement.  CXR: pending.   Toula Moos 12/24/2013

## 2013-12-24 NOTE — Procedures (Deleted)
Bedside Tracheostomy Insertion Procedure Note   Patient Details:   Name: Hector [name] DOB: [birth year] MRN: [id number]  Procedure: Tracheostomy  Pre Procedure Assessment: ET Tube Size:7.5 ET Tube secured at lip (cm):19 Bite block in place: Yes Breath Sounds: Rhonch  Post Procedure Assessment: BP 113/88  Pulse 97  Temp(Src) 98.8 F (37.1 C) (Oral)  Resp 14  Ht  (1.702 m)  Wt 110 lb 14.3 oz (50.3 kg)  BMI 17.36 kg/m2  SpO2 100% O2 sats: stable throughout Complications: No apparent complications Patient did tolerate procedure well Tracheostomy Brand:Shiley Tracheostomy Style:Cuffed Tracheostomy Size: 6.0 Tracheostomy Secured WUJ:[id number] Tracheostomy Placement Confirmation:Trach cuff visualized and in place and Chest X ray ordered for placement    Leonard Downing 12/24/2013, 2:49 PM

## 2013-12-24 NOTE — Procedures (Signed)
Present for procedure  Yalda Herd, MD [place] [facility] Pager - [phone] On Call Pager - [phone]  

## 2013-12-24 NOTE — Progress Notes (Signed)
Withdrew ETT 2CM to 22 at the lip per verbal order Devra Dopp, NP

## 2013-12-24 NOTE — Progress Notes (Signed)
NUTRITION FOLLOW UP  Intervention:   - Continue TF via PEG of Jevity 1.5 at 13m/hr  - Continue adult enteral protocol  - RD to continue to monitor   Nutrition Dx:   Inadequate oral intake related to inability to eat as evidenced by NPO - ongoing    Goal:   1. Advance diet as tolerated to regular diet - not met, remains NPO 2. 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 Patient was comatose for prolonged period of time and then regained consciousness he was no longer verbal. Patient was discharged to home but family states have been aggressive towards them. Patient now requires 24-hour care. Family states that 3 days ago patient started to become agitated grabbing at the right flank/side. Pt in restraints. Found to have acute agitated delirium.   8/21: - Per telephone conversation with father, pt was on a regular/thin liquid diet at home and usually eats 50% of his meals and drinks 2-3 Ensure/day in addition to lots of caffeine free mountain dew  - Sample meals include mac and cheese with protein and vegetables  - Said that in the past 3-4 days pt has only been taking 1-2 bites at mealtimes  - Father said lowest weight was 105 pounds and earlier this week it was 110 pounds  - Said the only thing put through pt's PEG at home is 6.5 oz of water three times/day  - Reported pt started coughing a lot with meals on Tuesday, usually has no problems  - Denies that pt has any problems chewing  - Appears thin with mild muscle wasting in clavicles  - Per NP, plan is to start trickle TF today  - TF: Jevity 1.5 at 161mhr via PEG - 360 calories, 15g protein meeting 22% estimated calorie needs, 23% estimated protein needs  8/24: - Noted pt with agitation and  restlessness last night with huge mucous plug removed from throat - TF increased to 2029mr on 8/22 - Per RN, pt tolerating TF well with 190m42msiduals this morning - Per conversation with NP, okay for RD to manage and advance TF to goal rate  - TF: Jevity 1.5 at 20ml9m- provides 720 calories, 31g protein and meets 44% estimated calorie needs, 48% estimated protein needs - Potassium slightly low, getting liquid replacement per PEG  8/28: - Had respiratory distress 8/26 and put on non-rebreather mask - Had increased tachypnea and tachycardia last night and was intubated  - Per RN, pt with 70ml 46mF residuals this morning - TF: Jevity 1.5 at 50ml/h39mich provides 1800 calories, 76g protein, and 912ml fr21mater and meets 95% estimated calorie needs and 100% estimated protein needs  8/31: - Extubated yesterday morning - Was agitated last night - Had problems expectorating secretions and was re-intubated today - Had 200ml TF 7mduals this morning   Patient is currently intubated on ventilator support MV: 9.8 L/min Temp (24hrs), Avg:98.2 F (36.8 C), Min:97.6 F (36.4 C), Max:98.9 F (37.2 C)  Propofol: off   TF: Jevity 1.5 at 50ml/hr w78m provides 1800 calories, 76g protein, and 912ml free 36mr and meets 110% estimated calorie needs and 100% estimated protein needs    Height: Ht Readings from Last 1 Encounters:  12/20/13 5'  7" (1.702 m)    Weight Status:   Wt Readings from Last 1 Encounters:  12/24/13 110 lb 14.3 oz (50.3 kg)  Admit wt         120 lb (54.4 kg)    Re-estimated needs:  Kcal: 1639 Protein: 65-80g  Fluid: per MD   Skin: non-pitting RUE, LUE edema, Right foot abrasion, right arm scab   Diet Order:  NPO   Intake/Output Summary (Last 24 hours) at 12/24/13 1251 Last data filed at 12/24/13 1236  Gross per 24 hour  Intake   2190 ml  Output   2325 ml  Net   -135 ml    Last BM: 8/30   Labs:   Recent Labs Lab 12/18/13 0337 12/19/13 0350   12/22/13 0319 12/23/13 0340 12/24/13 0345  NA 141 142  < > 142 140 140  K 3.3* 3.6*  < > 4.0 4.1 3.9  CL 103 102  < > 104 100 101  CO2 27 27  < > 28 28 27   BUN 7 12  < > 12 11 11   CREATININE 0.52 0.58  < > 0.56 0.52 0.51  CALCIUM 9.1 9.2  < > 9.0 9.0 9.3  MG 1.9 1.8  --   --   --   --   PHOS 2.4  --   --   --   --   --   GLUCOSE 160* 184*  < > 155* 164* 152*  < > = values in this interval not displayed.  CBG (last 3)  No results found for this basename: GLUCAP,  in the last 72 hours  Scheduled Meds: . antiseptic oral rinse  7 mL Mouth Rinse q12n4p  . aspirin  81 mg Per Tube Daily  . cefTAZidime (FORTAZ)  IV  2 g Intravenous Q8H  . chlorhexidine  15 mL Mouth Rinse BID  . clonazePAM  1 mg Per Tube QHS  . docusate  100 mg Per Tube BID  . enoxaparin (LOVENOX) injection  50 mg Subcutaneous Q12H  . feeding supplement (JEVITY 1.5 CAL/FIBER)  1,000 mL Per Tube Q24H  . free water  200 mL Per Tube Q6H  . lidocaine (cardiac) 100 mg/67m      . metoprolol tartrate  12.5 mg Per Tube Q12H  . nicotine  7 mg Transdermal Daily  . QUEtiapine  100 mg Oral TID  . rocuronium      . sennosides  5 mL Per Tube BID  . sodium chloride  3 mL Intravenous Q12H  . succinylcholine      . Valproic Acid  500 mg Per Tube Q12H  . vancomycin  750 mg Intravenous 3 times per day    Continuous Infusions: . sodium chloride 10 mL/hr at 12/24/13 0800  . dexmedetomidine 0.8 mcg/kg/hr (12/24/13 1236)    HCarlis StableMS, RD, LDN [phone] [phone]/After Hours Pager

## 2013-12-24 NOTE — Progress Notes (Signed)
CARE MANAGEMENT NOTE 12/24/2013  Patient:  IZAAH, WESTMAN   Account Number:  [id number]  Date Initiated:  12/13/2013  Documentation initiated by:  Berenda Morale  Subjective/Objective Assessment:   See ED Case Management Note     Action/Plan:   Anticipated DC Date:  12/27/2013   Anticipated DC Plan:  SKILLED NURSING FACILITY  In-house referral  NA      DC Planning Services  CM consult      PAC Choice  NA   Choice offered to / List presented to:  NA      DME agency  NA     HH arranged  NA      HH agency  NA   Status of service:  In process, will continue to follow Medicare Important Message given?  NA - LOS <3 / Initial given by admissions (If response is "NO", the following Medicare IM given date fields will be blank) Date Medicare IM given:   Medicare IM given by:   Date Additional Medicare IM given:   Additional Medicare IM given by:    Discharge Disposition:    Per UR Regulation:  Reviewed for med. necessity/level of care/duration of stay  If discussed at Long Length of Stay Meetings, dates discussed:   12/18/2013  12/20/2013    Comments:  [id number] Mi Balla,RNm,BSN,CCM: 1.) ct scan on [id number]: Remote left-sided infarction with encephalomalacia and ex vacuo dilatation of the left lateral ventricle, particularly the temporal horn. 2.) agitiation -iv percedex drip restarted on [id number]. 3.) intubated [id number] due to acute resp. resp. expected outcome: will see how patient progresses. [id number] Mita Vallo,RN,BSN,CCM: Fi02 up to 100% on a nonrebreathing mask/agitation is presisting but iv predcedex is off/iv abx cont/patient is a high risk for intubation. [id number] Casmir Auguste,RN,BSN,CCM: Chart reviewed for any dc needs.  Off iv precedex drip this a.m./psych med adjustment ongoing/pt was ivc'd at admission /plan is to continue with this and possible updated pysch eval.  [id number] Kylei Purington,RN,BSN,CCM: Patient remains highly agitiated, confused., poss  etoh w/d iv precedex.  12/13/13 Sandford Craze Rn, BSN, NCM Will await PT recommendations and assist with DC needs.

## 2013-12-24 NOTE — Procedures (Signed)
Intubation Procedure Note DEQUARIUS JEFFRIES [id number] 30-May-1970  Procedure: Intubation Indications: Airway protection and maintenance  Procedure Details Consent: Unable to obtain consent because of emergent medical necessity. Time Out: Verified patient identification, verified procedure, site/side was marked, verified correct patient position, special equipment/implants available, medications/allergies/relevent history reviewed, required imaging and test results available.  Performed  MAC and 4 Medications:  Fentanyl 100 mcg Etomidate 20 mg Versed 2 mg NMB    Evaluation Hemodynamic Status: BP stable throughout; O2 sats: stable throughout Patient's Current Condition: stable Complications: No apparent complications Patient did tolerate procedure well. Chest X-ray ordered to verify placement.  CXR: pending.   [name] Minor ACNP [facility] PCCM Pager [phone] till 3 pm If no answer page [phone] 12/24/2013, 11:39 AM

## 2013-12-24 NOTE — Progress Notes (Signed)
ANTICOAGULATION CONSULT NOTE  Pharmacy Consult for Lovenox Indication: Hx DVT  Allergies  Allergen Reactions  . Ativan [Lorazepam]     Pt has paradoxical effect with sedating medications.     Patient Measurements: Height:  (170.2 cm) Weight: 110 lb 14.3 oz (50.3 kg) IBW/kg (Calculated) : 66.1  Vital Signs: Temp: 97.9 F (36.6 C) (08/31 0830) Temp src: Axillary (08/31 0830) BP: 119/78 mmHg (08/31 0700) Pulse Rate: 109 (08/31 0700)  Labs:  Recent Labs  12/21/13 1211  12/22/13 0319 12/23/13 0340 12/24/13 0345  HGB  --   < > 9.6* 10.5* 11.2*  HCT  --   --  29.3* 32.1* 34.8*  PLT  --   --  158 185 220  CREATININE  --   --  0.56 0.52 0.51  TROPONINI <0.30  --   --   --   --   < > = values in this interval not displayed.  Estimated Creatinine Clearance: 83 ml/min (by C-G formula based on Cr of 0.51).   Medical History: Past Medical History  Diagnosis Date  . 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 Seizure   . DVT (deep venous thrombosis)     UE, LE DVT during hospitalization  . H/O renal calculi    Assessment: 68 yoM h/o traumatic brain injury, seizures. Admitted for AMS, agitation on 8/20. 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 Pharmacy consulted 8/25 to begin lovenox until able to resume xarelto (pending head CT when patient less agitated).   Current weight 50.3 kg  SCr WNL,stable, CrCl > 30 ml/min  H/H low but stable, Hgb and Plts improved today  Goal of Therapy:  Anti-Xa level 0.6-1 units/ml 4hrs after LMWH dose given Monitor platelets by anticoagulation protocol: Yes - note low at baseline   Plan:   Continue Lovenox  ( /kg) SQ q12h.  Monitor renal function and adjust as needed.  Monitor CBC and signs/symptoms of bleeding, ensure CBC at min q72h while in hospital  Juliette Alcide, PharmD,  BCPS.   Pager: [phone] 12/24/2013 8:39 AM

## 2013-12-24 NOTE — Progress Notes (Signed)
Patient seen and examined with NP S. Minor, agree with assessment and plan with following exceptions 1. Respiratory distress - inability to clear secretions effectively and now becoming more somnolent - plan for intubation today - given his TBI with recent HCAP, this will be his 2nd intubation for this admission.  He has dec gag reflex, inability to effectively clear secretion, AMS and inability to effectively protect his airway at this time, will discuss with family about tracheostomy.   Critical Care Time - 35 mins  Hector [name] Hector [name] [name] Pager [phone] On Call Pager [phone]

## 2013-12-25 ENCOUNTER — Inpatient Hospital Stay (HOSPITAL_COMMUNITY): Payer: Medicaid Other

## 2013-12-25 ENCOUNTER — Encounter: Payer: Medicaid Other | Admitting: Speech Pathology

## 2013-12-25 ENCOUNTER — Encounter: Payer: Medicaid Other | Admitting: Occupational Therapy

## 2013-12-25 ENCOUNTER — Ambulatory Visit: Payer: Medicaid Other

## 2013-12-25 ENCOUNTER — Encounter (HOSPITAL_COMMUNITY): Payer: Self-pay | Admitting: Internal Medicine

## 2013-12-25 DIAGNOSIS — R05 Cough: Secondary | ICD-10-CM

## 2013-12-25 DIAGNOSIS — R059 Cough, unspecified: Secondary | ICD-10-CM

## 2013-12-25 LAB — BLOOD GAS, ARTERIAL
Acid-Base Excess: 3.5 mmol/L — ABNORMAL HIGH (ref 0.0–2.0)
Bicarbonate: 27.2 mEq/L — ABNORMAL HIGH (ref 20.0–24.0)
DRAWN BY: 103701
FIO2: 0.4 %
LHR: 14 {breaths}/min
MECHVT: 530 mL
O2 Saturation: 99 %
PCO2 ART: 39.3 mmHg (ref 35.0–45.0)
PEEP: 5 cmH2O
PO2 ART: 144 mmHg — AB (ref 80.0–100.0)
Patient temperature: 98.6
TCO2: 24.5 mmol/L (ref 0–100)
pH, Arterial: 7.455 — ABNORMAL HIGH (ref 7.350–7.450)

## 2013-12-25 LAB — CULTURE, BLOOD (ROUTINE X 2)
CULTURE: NO GROWTH
Culture: NO GROWTH

## 2013-12-25 LAB — VANCOMYCIN, TROUGH: Vancomycin Tr: 24.9 ug/mL — ABNORMAL HIGH (ref 10.0–20.0)

## 2013-12-25 LAB — HEMOGLOBIN A1C
Hgb A1c MFr Bld: 6.2 % — ABNORMAL HIGH (ref <5.7)
Mean Plasma Glucose: 131 mg/dL — ABNORMAL HIGH (ref <117)

## 2013-12-25 LAB — GLUCOSE, CAPILLARY
Glucose-Capillary: 109 mg/dL — ABNORMAL HIGH (ref 70–99)
Glucose-Capillary: 99 mg/dL (ref 70–99)
Glucose-Capillary: 130 mg/dL — ABNORMAL HIGH (ref 70–99)

## 2013-12-25 MED ORDER — FENTANYL CITRATE 0.05 MG/ML IJ SOLN
200.0000 ug | Freq: Once | INTRAMUSCULAR | Status: AC
  Administered 2013-12-25: 100 ug via INTRAVENOUS
  Filled 2013-12-25: qty 4

## 2013-12-25 MED ORDER — INSULIN ASPART 100 UNIT/ML ~~LOC~~ SOLN
0.0000 [IU] | SUBCUTANEOUS | Status: DC
  Administered 2013-12-25 – 2013-12-29 (×15): 1 [IU] via SUBCUTANEOUS
  Administered 2013-12-29: 4 [IU] via SUBCUTANEOUS
  Administered 2013-12-29 – 2014-01-03 (×14): 1 [IU] via SUBCUTANEOUS

## 2013-12-25 MED ORDER — ETOMIDATE 2 MG/ML IV SOLN
INTRAVENOUS | Status: AC
  Filled 2013-12-25: qty 20

## 2013-12-25 MED ORDER — ROCURONIUM BROMIDE 50 MG/5ML IV SOLN
INTRAVENOUS | Status: AC
  Filled 2013-12-25: qty 2

## 2013-12-25 MED ORDER — MIDAZOLAM HCL 2 MG/2ML IJ SOLN
4.0000 mg | Freq: Once | INTRAMUSCULAR | Status: AC
Start: 2013-12-25 — End: 2013-12-25
  Administered 2013-12-25: 2 mg via INTRAVENOUS
  Filled 2013-12-25: qty 4

## 2013-12-25 MED ORDER — FENTANYL CITRATE 0.05 MG/ML IJ SOLN
25.0000 ug | INTRAMUSCULAR | Status: DC | PRN
  Administered 2013-12-25: 50 ug via INTRAVENOUS
  Filled 2013-12-25: qty 2

## 2013-12-25 MED ORDER — LIDOCAINE HCL (CARDIAC) 20 MG/ML IV SOLN
INTRAVENOUS | Status: AC
  Filled 2013-12-25: qty 5

## 2013-12-25 MED ORDER — ETOMIDATE 2 MG/ML IV SOLN
40.0000 mg | Freq: Once | INTRAVENOUS | Status: AC
  Administered 2013-12-25: 10 mg via INTRAVENOUS
  Filled 2013-12-25: qty 20

## 2013-12-25 MED ORDER — CETYLPYRIDINIUM CHLORIDE 0.05 % MT LIQD
7.0000 mL | Freq: Four times a day (QID) | OROMUCOSAL | Status: DC
  Administered 2013-12-25 – 2014-01-03 (×36): 7 mL via OROMUCOSAL

## 2013-12-25 MED ORDER — PANTOPRAZOLE SODIUM 40 MG IV SOLR
40.0000 mg | INTRAVENOUS | Status: DC
  Administered 2013-12-25: 40 mg via INTRAVENOUS
  Filled 2013-12-25: qty 40

## 2013-12-25 MED ORDER — SUCCINYLCHOLINE CHLORIDE 20 MG/ML IJ SOLN
INTRAMUSCULAR | Status: AC
  Filled 2013-12-25: qty 1

## 2013-12-25 MED ORDER — VANCOMYCIN HCL IN DEXTROSE 1-5 GM/200ML-% IV SOLN: 1000.0000 mg | Freq: Two times a day (BID) | INTRAVENOUS | Status: DC

## 2013-12-25 MED ORDER — FENTANYL CITRATE 0.05 MG/ML IJ SOLN
25.0000 ug | INTRAMUSCULAR | Status: DC | PRN
  Administered 2013-12-25 – 2014-01-01 (×3): 100 ug via INTRAVENOUS
  Filled 2013-12-25 (×3): qty 2

## 2013-12-25 MED ORDER — PROPOFOL 10 MG/ML IV EMUL
5.0000 ug/kg/min | Freq: Once | INTRAVENOUS | Status: AC
  Administered 2013-12-25: 30 ug/kg/min via INTRAVENOUS
  Filled 2013-12-25: qty 100

## 2013-12-25 MED ORDER — VECURONIUM BROMIDE 10 MG IV SOLR
10.0000 mg | Freq: Once | INTRAVENOUS | Status: AC
Start: 2013-12-25 — End: 2013-12-25
  Administered 2013-12-25: 10 mg via INTRAVENOUS
  Filled 2013-12-25: qty 10

## 2013-12-25 NOTE — Procedures (Signed)
Perc trach  8 Shiley placed Blood loess less 5 cc Tolerated well See full dictation  Mcarthur Rossetti. Tyson Alias, MD, FACP Pgr: [phone] [facility]

## 2013-12-25 NOTE — Procedures (Signed)
Bronchoscopy  for Percutaneous  Tracheostomy  Name: Hector [name] MRN: [id number] DOB: 1971-02-07 Procedure: Bronchoscopy for Percutaneous Tracheostomy Indications: Diagnostic evaluation of the airways In conjunction with: Dr. Tyson Alias   Procedure Details Consent: Risks of procedure as well as the alternatives and risks of each were explained to the (patient/caregiver).  Consent for procedure obtained. Time Out: Verified patient identification, verified procedure, site/side was marked, verified correct patient position, special equipment/implants available, medications/allergies/relevent history reviewed, required imaging and test results available.  Performed  In preparation for procedure, patient was given 100% FiO2 and bronchoscope lubricated. Sedation: Benzodiazepines, Muscle relaxants, Etomidate and Short-acting barbiturates  Airway entered and the following bronchi were examined: RML.   Procedures performed: Endotracheal Tube retracted in 2 cm increments. Cannulation of airway observed. Dilation observed. Placement of trachel tube  observed . No overt complications. Bronchoscope removed.    Evaluation Hemodynamic Status: BP stable throughout; O2 sats: stable throughout Patient's Current Condition: stable Specimens:  None Complications: No apparent complications Patient did tolerate procedure well.   [name] Dagny Fiorentino ACNP [facility] PCCM Pager [phone] till 3 pm If no answer page [phone] 12/25/2013, 12:26 PM

## 2013-12-25 NOTE — Progress Notes (Addendum)
ANTIBIOTIC CONSULT NOTE   Pharmacy Consult for Ceftazidime, Vancomycin Indication: HCAP  Allergies  Allergen Reactions  . Ativan [Lorazepam]     Pt has paradoxical effect with sedating medications.     Patient Measurements: Height:  (170.2 cm) Weight: 110 lb 14.3 oz (50.3 kg) IBW/kg (Calculated) : 66.1  Vital Signs: Temp: 98.7 F (37.1 C) (08/31 2319) Temp src: Oral (08/31 2319) BP: 108/51 mmHg (09/01 0500) Pulse Rate: 81 (09/01 0500) Intake/Output from previous day: 08/31 0701 - 09/01 0700 In: 2483.3 [I.V.:343.3; NG/GT:1650; IV Piggyback:450] Out: 1420 [Urine:1420]  Labs:  Recent Labs  12/23/13 0340 12/24/13 0345  WBC 6.6 9.5  HGB 10.5* 11.2*  PLT 185 220  CREATININE 0.52 0.51   Estimated Creatinine Clearance: 83 ml/min (by C-G formula based on Cr of 0.51).   Medical History: Past Medical History  Diagnosis Date  . 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 Seizure   . DVT (deep venous thrombosis)     UE, LE DVT during hospitalization  . H/O renal calculi     Assessment: 41 yoM presented on 8/17 with h/o traumatic brain injury, seizures, in ED x 3 days awaiting placement. Became febrile and hypotensive with possible seizure activity and r/o pneumonia.  Received one time doses of Zosyn and Vanc for sepsis/HCAP on 8/20.  Pharmacy is now consulted to dose ceftazidime and Vancomycin for suspected HCAP.  8/20 >> Zosyn >> 8/20 8/20 >> Vanc >> 8/20 8/26 >> Ceftazidime >> 8/27 >> Vanc >>  Tmax: 99.1 WBCs: WNL Renal: SCr low/stable  8/20 HIV: nonreactive 8/20 Urine legionella and strep pneumo Ag: negative 8/20 MRSA PCR: positive 8/20 blood x2: NGF 8/20 urine: NGF 8/26 sputum: normal flora - final 8/26 blood x2: ngtd  Drug level / dose changes info: 8/28 1700 VT: 11 on  q8h prior to 5th dose, incr to  IV q8h  8/29 2100 VT: 20.1 mcg/ml on 750 q8h 9/1 0500 VT: 24.9 mcg/ml On  q8h  Goal of Therapy:   Appropriate abx dosing, eradication of infection. Vancomycin trough level 15-20 mcg/ml  Plan:  D#7 ceftazadime/D#6 vancomycin  Vancomycin trough supratherapeutic, decrease vancomycin tp 1gm IV q12h for est pk 63mcg/ml, trough 78mcg/ml  Continue current ceftazadime dosing  Juliette Alcide, PharmD, BCPS.   Pager: [phone] 12/25/2013 7:07 AM

## 2013-12-25 NOTE — Progress Notes (Signed)
Patient seen and examined with NP S. Minor.  Lab, images, and vitals reviewed. Agree with NP S. Minor assessment and plan with the following exceptions:   1. Respiratory failure - possible secondary to TBI and recent HCAP, now with dec gag\cough reflex - plan for trach today - cont with MV weaning protocol  2. Seizure disorder - waxing and waning mental status with moderate confusion now completely somnolent on the vent without sedation\analgesia , 24 hr continuous EEG and\or possible LP if no improvement. - neurology follow up   Critical Care Time =  [name], MD [facility] Pager [phone] On Call Pager [phone]

## 2013-12-25 NOTE — Progress Notes (Signed)
PULMONARY / CRITICAL CARE MEDICINE   Name: Hector [name] MRN: [id number] DOB: 1970/07/14    ADMISSION DATE:  12/11/2013 CONSULTATION DATE:  12/13/13  REFERRING MD :  Dr. Izola Price / [facility]   CHIEF COMPLAINT:  Seizure, concern for airway protection   INITIAL PRESENTATION: 43 yo male presented to ER for involuntary commitment.  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  He was noted to having jerking movement / possible seizures.  PCCM asked to assess with concern for airway protection/aspiration.  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 R airspace disease 8/21 Improved agitation, brady with precedex 8/26 Started Abx for HCAP, precedex d/c'ed 8/27 R arm weakness ?new >> neuro reconsulted 8/28 VDRF 8/30 Extubated 8/31 agitated and rhonchus  8/31 intubated for acute resp distress 9/1 tracheostomy   INTERVAL HISTORY:  Sedated, re intubated  VITAL SIGNS: Temp:  [96.7 F (35.9 C)-99.3 F (37.4 C)] 98.7 F (37.1 C) (08/31 2319) Pulse Rate:  [73-120] 98 (09/01 0800) Resp:  [14-51] 20 (09/01 0800) BP: (65-165)/(42-142) 98/59 mmHg (09/01 0800) SpO2:  [96 %-100 %] 99 % (09/01 0800) FiO2 (%):  [40 %-100 %] 40 % (09/01 0800) Weight:  [110 lb 14.3 oz (50.3 kg)] 110 lb 14.3 oz (50.3 kg) (08/31 1506)  VENT SETTINGS: Vent Mode:  [-] PRVC FiO2 (%):  [40 %-100 %] 40 % Set Rate:  [14 bmp] 14 bmp Vt Set:  [530 mL] 530 mL PEEP:  [5 cmH20] 5 cmH20 Plateau Pressure:  [16 cmH20-23 cmH20] 16  cmH20 V mask  INTAKE / OUTPUT: Intake/Output     08/31 0701 - 09/01 0700 09/01 0701 - 09/02 0700   I.V. 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  Sepsis Markers No results found for this basename: LATICACIDVEN, PROCALCITON, O2SATVEN,  in the last 168 hours ABG  Recent Labs Lab 12/19/13 0359 12/20/13 2305 12/24/13 1233  PHART 7.442 7.455* 7.397  PCO2ART 43.9 38.9 43.7  PO2ART 99.2 213.0* 442.0*   Liver Enzymes  Recent Labs Lab 12/22/13 0319  AST 25  ALT 16  ALKPHOS 230*  BILITOT <0.2*  ALBUMIN 1.9*   Cardiac Enzymes  Recent Labs Lab 12/21/13 0038 12/21/13 0630 12/21/13 1211  TROPONINI <0.30 <0.30 <0.30   IMAGING:  Dg Chest Port 1 View  12/24/2013   CLINICAL DATA:  Status post intubation  EXAM: PORTABLE CHEST - 1 VIEW  COMPARISON:  Portable chest x-ray of December 22, 2013   FINDINGS: The endotracheal tube tip lies approximately 1.5 cm above the crotch of the carina. The lungs are adequately inflated. Confluent increased density in the right mid to lower lung field is present. The interstitial markings are coarse bilaterally and there is subsegmental atelectasis inferiorly on the left. The cardiac silhouette is normal in size. The bony thorax is unremarkable.  IMPRESSION: 1. The endotracheal tube tip lies 1.6 cm above the crotch of the carina. Withdrawal by 2-3 cm is recommended. 2. Mildly increased interstitial markings bilaterally persist with an area of confluent density in the right mid to lower lung more conspicuous than in the past. 3. These results were called by telephone at the time of interpretation on 12/24/2013 at 12:08 pm to Dub Amis, RN, who verbally acknowledged these results.   Electronically Signed   By: David  [place]   On: 12/24/2013 12:09    ASSESSMENT / PLAN:  PULMONARY A:OTT 8/31(second)>> Acute hypoxemixc respiratory failre HCAP Tobacco use disorder P: Extubated 8/30, re intubated 8/31 Goal SpO2>92 Trend CXR  Trach 9/1 planned  CARDIOLOGY A: Sinus tachycardia H/o upper / lower extremity DVT on Xarelto preadmission P:  Goal MAP>65 Anticoagulation as below ASA Metoprolol  RENAL A: No active issues P:   Trend BMP Free water  GASTROINTESTINAL A: Dysphagia, s/p PEG Protein calorie malnutrition GI Px is not indicated P:   TF on hold for trach   HEMATOLOGIC A: Anemia of chronic disease Anticoagulation for h/o DVT P:  Monitor CBC Lovenox ( therapeutic dosing ) on hold for trach  INFECTIOUS A: Presumed HCAP, developed 8/26. P:   Fortaz 8/26>> day 6/10 Vancomycin 8/27>>9/1  blood cx 8/26>>neg 8/27 sputum>>nl flora   ENDOCRINE A: No acute issues. P:   No intervention required  NEUROLOGY A; Severe agitated delirium Traumatic brain injury after seizure / fall Expressive aphasia Residual RUE weakness  (chronic) Hx of ETOH P:   Preadmission Amantadine, Bromocriptine, Remeron on hold - indication? Parkinson symptoms? Seroquel 8/29 Valproate, Klonopin as preadmission PRN fentanyl Might need Psychiatry to re-assess when medically stable Neurology following, no further imaging recommended. Hold sedation post trach. Wake up assessment. May near neuro to revisit.  [name] Minor ACNP [facility] PCCM Pager [phone] till 3 pm If no answer page [phone] 12/25/2013, 9:41 AM

## 2013-12-25 NOTE — Procedures (Signed)
Bedside Tracheostomy Insertion Procedure Note   Patient Details:   Name: Hector [name] DOB: [birth year] MRN: [id number]  Procedure: Tracheostomy  Pre Procedure Assessment: ET Tube Size:8.0 ET Tube secured at lip (cm):22 Bite block in place: Yes Breath Sounds: Rhonch  Post Procedure Assessment: BP 103/64  Pulse 90  Temp(Src) 98.2 F (36.8 C) (Axillary)  Resp 16  Ht  (1.702 m)  Wt 116 lb 10 oz (52.9 kg)  BMI 18.26 kg/m2  SpO2 100% O2 sats: stable throughout Complications: No apparent complications Patient did not tolerate procedure well Tracheostomy Brand:Shiley Tracheostomy Style:Cuffed Tracheostomy Size: 8.0 Tracheostomy Secured WUJ:[id number] and velcro Tracheostomy Placement Confirmation:Trach cuff visualized and in place ans post procedure chest x-ray  RT for ICU informed of vent changes made during trach procedure  Increased to 16 rate and Fio2 increased to 100%    Ruthell Rummage Nanette 12/25/2013, 12:50 PM

## 2013-12-25 NOTE — Plan of Care (Signed)
Problem: Phase I Progression Outcomes Goal: Initial discharge plan identified Outcome: Progressing Current plans at this point are to discharge pt to [facility]. Parents are unable to care for pt at home, due to age and their own health status. Code status has not been addressed with family at this point, currently pt remains a full code.  Goal: Voiding-avoid urinary catheter unless indicated Outcome: Not Applicable Date Met:  09/92/78 MD orders remain not to remove foley.

## 2013-12-25 NOTE — Procedures (Signed)
Present for procedure  Keirstan Iannello, MD [facility] Pager - [phone] On Call Pager - [phone]  

## 2013-12-26 ENCOUNTER — Inpatient Hospital Stay (HOSPITAL_COMMUNITY): Payer: Medicaid Other

## 2013-12-26 DIAGNOSIS — J690 Pneumonitis due to inhalation of food and vomit: Secondary | ICD-10-CM

## 2013-12-26 LAB — GLUCOSE, CAPILLARY
GLUCOSE-CAPILLARY: 106 mg/dL — AB (ref 70–99)
Glucose-Capillary: 134 mg/dL — ABNORMAL HIGH (ref 70–99)
Glucose-Capillary: 106 mg/dL — ABNORMAL HIGH (ref 70–99)
Glucose-Capillary: 141 mg/dL — ABNORMAL HIGH (ref 70–99)
Glucose-Capillary: 125 mg/dL — ABNORMAL HIGH (ref 70–99)
Glucose-Capillary: 123 mg/dL — ABNORMAL HIGH (ref 70–99)

## 2013-12-26 LAB — BASIC METABOLIC PANEL
ANION GAP: 12 (ref 5–15)
BUN: 12 mg/dL (ref 6–23)
CHLORIDE: 97 meq/L (ref 96–112)
CO2: 27 mEq/L (ref 19–32)
Calcium: 9.2 mg/dL (ref 8.4–10.5)
Creatinine, Ser: 0.53 mg/dL (ref 0.50–1.35)
GFR calc Af Amer: >90 mL/min (ref >90)
GFR calc non Af Amer: >90 mL/min (ref >90)
Glucose, Bld: 152 mg/dL — ABNORMAL HIGH (ref 70–99)
POTASSIUM: 4.5 meq/L (ref 3.7–5.3)
SODIUM: 136 meq/L — AB (ref 137–147)

## 2013-12-26 LAB — CBC
HCT: 32.8 % — ABNORMAL LOW (ref 39.0–52.0)
Hemoglobin: 10.6 g/dL — ABNORMAL LOW (ref 13.0–17.0)
MCH: 27.1 pg (ref 26.0–34.0)
MCHC: 32.3 g/dL (ref 30.0–36.0)
MCV: 83.9 fL (ref 78.0–100.0)
PLATELETS: 232 10*3/uL (ref 150–400)
RBC: 3.91 MIL/uL — AB (ref 4.22–5.81)
RDW: 16.6 % — ABNORMAL HIGH (ref 11.5–15.5)
WBC: 12.3 10*3/uL — AB (ref 4.0–10.5)

## 2013-12-26 MED ORDER — ENOXAPARIN SODIUM 60 MG/0.6ML ~~LOC~~ SOLN
50.0000 mg | Freq: Two times a day (BID) | SUBCUTANEOUS | Status: DC
  Administered 2013-12-26 – 2013-12-30 (×8): 50 mg via SUBCUTANEOUS
  Filled 2013-12-26 (×11): qty 0.6

## 2013-12-26 MED ORDER — PANTOPRAZOLE SODIUM 40 MG PO PACK
40.0000 mg | PACK | ORAL | Status: DC
  Administered 2013-12-26 – 2014-01-02 (×7): 40 mg
  Filled 2013-12-26 (×10): qty 20

## 2013-12-26 NOTE — Progress Notes (Signed)
PHARMACY BRIEF NOTE:  IV TO VIA TUBE CONVERSION - PROTONIX  The patient is receiving Protonix by the intravenous route.  Based on criteria approved by the Pharmacy and Therapeutics Committee and the Medical Executive Committee, the medication is being converted to the equivalent dose form via feeding tube.  These criteria include: -No active GI bleeding -Able to tolerate diet of full liquids (or better) or tube feeding -Able to tolerate other medications by the oral or enteral route  If you have any questions about this conversion, please contact the Pharmacy Department (phone [phone]).  Thank you.  Elie Goody, PharmD, BCPS Pager: [phone] 12/26/2013  9:22 AM

## 2013-12-26 NOTE — Plan of Care (Signed)
Problem: Phase I Progression Outcomes Goal: OOB as tolerated unless otherwise ordered Outcome: Not Progressing New trach 12/25/13

## 2013-12-26 NOTE — Progress Notes (Signed)
NUTRITION FOLLOW UP  Intervention:   - Continue TF via PEG of Jevity 1.5 at 78m/hr  - Continue adult enteral protocol  - RD to continue to monitor   Nutrition Dx:   Inadequate oral intake related to inability to eat as evidenced by NPO - ongoing    Goal:   1. Advance diet as tolerated to regular diet - not met, remains NPO 2. 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 Patient was comatose for prolonged period of time and then regained consciousness he was no longer verbal. Patient was discharged to home but family states have been aggressive towards them. Patient now requires 24-hour care. Family states that 3 days ago patient started to become agitated grabbing at the right flank/side. Pt in restraints. Found to have acute agitated delirium.   8/21: - Per telephone conversation with father, pt was on a regular/thin liquid diet at home and usually eats 50% of his meals and drinks 2-3 Ensure/day in addition to lots of caffeine free mountain dew  - Sample meals include mac and cheese with protein and vegetables  - Said that in the past 3-4 days pt has only been taking 1-2 bites at mealtimes  - Father said lowest weight was 105 pounds and earlier this week it was 110 pounds  - Said the only thing put through pt's PEG at home is 6.5 oz of water three times/day  - Reported pt started coughing a lot with meals on Tuesday, usually has no problems  - Denies that pt has any problems chewing  - Appears thin with mild muscle wasting in clavicles  - Per NP, plan is to start trickle TF today  - TF: Jevity 1.5 at 184mhr via PEG - 360 calories, 15g protein meeting 22% estimated calorie needs, 23% estimated protein needs  8/24: - Noted pt with agitation and  restlessness last night with huge mucous plug removed from throat - TF increased to 2053mr on 8/22 - Per RN, pt tolerating TF well with 190m78msiduals this morning - Per conversation with NP, okay for RD to manage and advance TF to goal rate  - TF: Jevity 1.5 at 20ml19m- provides 720 calories, 31g protein and meets 44% estimated calorie needs, 48% estimated protein needs - Potassium slightly low, getting liquid replacement per PEG  8/28: - Had respiratory distress 8/26 and put on non-rebreather mask - Had increased tachypnea and tachycardia last night and was intubated  - Per RN, pt with 70ml 73mF residuals this morning - TF: Jevity 1.5 at 50ml/h76mich provides 1800 calories, 76g protein, and 912ml fr36mater and meets 95% estimated calorie needs and 100% estimated protein needs  8/31: - Extubated yesterday morning - Was agitated last night - Had problems expectorating secretions and was re-intubated today - Had 200ml TF 33mduals this morning   9/2: - Had trach placed yesterday, TF held for placement - Was resumed after placement, at goal rate with no TF residuals per RN  TF: Jevity 1.5 at 50ml/hr w62m provides 1800 calories, 76g protein, and 912ml free 53mr and meets 110% estimated calorie needs and 100% estimated protein needs    Height: Ht Readings from Last 1 Encounters:  12/20/13 _0  (1.702 m)  Weight Status:   Wt Readings from Last 1 Encounters:  12/26/13 108 lb 14.5 oz (49.4 kg)  Admit wt         120 lb (54.4 kg)    Re-estimated needs:  Kcal: 1650-1850  Protein: 65-80g  Fluid: 1.6-1.8L/day     Skin: non-pitting RUE, LUE edema, Right foot abrasion, right arm scab   Diet Order: NPO   Intake/Output Summary (Last 24 hours) at 12/26/13 0917 Last data filed at 12/26/13 0800  Gross per 24 hour  Intake 2182.14 ml  Output   2075 ml  Net 107.14 ml    Last BM: 9/1   Labs:   Recent Labs Lab 12/23/13 0340 12/24/13 0345 12/26/13 0340  NA 140  140 136*  K 4.1 3.9 4.5  CL 100 101 97  CO2 _0 BUN _1 CREATININE 0.52 0.51 0.53  CALCIUM 9.0 9.3 9.2  GLUCOSE 164* 152* 152*    CBG (last 3)   Recent Labs  12/26/13 0001 12/26/13 0335 12/26/13 0813  GLUCAP 106* 134* 106*    Scheduled Meds: . antiseptic oral rinse  7 mL Mouth Rinse QID  . aspirin  81 mg Per Tube Daily  . cefTAZidime (FORTAZ)  IV  2 g Intravenous Q8H  . chlorhexidine  15 mL Mouth Rinse BID  . clonazePAM  1 mg Per Tube QHS  . docusate  100 mg Per Tube BID  . feeding supplement (JEVITY 1.5 CAL/FIBER)  1,000 mL Per Tube Q24H  . free water  200 mL Per Tube Q6H  . insulin aspart  0-9 Units Subcutaneous 6 times per day  . metoprolol tartrate  12.5 mg Per Tube Q12H  . midazolam  4 mg Intravenous Once  . pantoprazole (PROTONIX) IV  40 mg Intravenous Q24H  . QUEtiapine  100 mg Oral TID  . sennosides  5 mL Per Tube BID  . sodium chloride  3 mL Intravenous Q12H  . Valproic Acid  500 mg Per Tube Q12H    Continuous Infusions: . sodium chloride 10 mL/hr at 12/24/13 0800  . dexmedetomidine Stopped (12/24/13 1900)    Carlis Stable MS, RD, LDN [phone] Pager [phone] Weekend/After Hours Pager

## 2013-12-26 NOTE — Op Note (Signed)
NAMEKARIEM, WOLFSON NO.:  [id number]  MEDICAL RECORD NO.:  [id number]  LOCATION:                                 FACILITY:  PHYSICIAN:  Nelda Bucks, MD DATE OF BIRTH:  October 02, 1970  DATE OF PROCEDURE:  12/25/2013 DATE OF DISCHARGE:  12/25/2013                              OPERATIVE REPORT   PROCEDURE:  Percutaneous tracheostomy.  Consent was obtained from the patient's father and mother, fully aware of risks and benefits of procedure including infection, bleeding, pneumothorax, and death.  PREOPERATIVE DIAGNOSES:  Chronic encephalopathy, recurrent respiratory failure requiring intubation x2, with poor secretion control.  POSTOPERATIVE DIAGNOSES:  Status post tracheostomy secondary to poor secretion control and no respiratory failure.  Bronchoscopist for the procedure was [name], MD.  FIRST ASSISTANT:  Devra Dopp, MSN, ACNP  Blood loss for the procedure was less than 5 mL.  The patient was placed in supine position.  Chlorhexidine preparation was used to sterilize the operative site.  7 mL of lidocaine plus epinephrine was injected over the second and third endotracheal spaces. The bronchoscopist placed a bronchoscope through the endotracheal tube backed up to approximately 17 cm.  An 1.2 cm vertical incision was made over second and third endotracheal spaces and dissection was made down to the tracheal planes fully going right in between the strap muscles and with no veins or arteries noted.  An 18-gauge needle was placed directly under visualization by the bronchoscopist over a white catheter sheath into the airway without any posterior wall injury.  The white needle was removed.  The white catheter sheath remained.  A wire was placed through the white catheter sheath and white catheter sheath remained.  A 14-French punch dilator was placed in and out of the airway.  A progressive rhino dilator 30 to 32-French was placed over a glider,  over the wire, in and out of the airway.  The glider and wire remained.  A size 8 tracheostomy over 28-French dilator was placed over the glider and wire successfully into the airway.  Everything removed except for the tracheostomy.  The tracheostomy sutured in place with 4-0 monofilament sutures.  The bronchoscopist placed a bronchoscope through the new tracheostomy and noted the carina approximately 4 cm below without any posterior wall injury or active bleeding.  The patient tolerated the procedure quite well hemodynamically.  He required paralysis, hypnotics, benzodiazepines, and fentanyl.  The patient is to have a chest x-ray right now and can follow up in our Percutaneous Tracheostomy Clinic by calling [phone].     Nelda Bucks, MD     DJF/MEDQ  D:  12/25/2013  T:  12/25/2013  Job:  [id number]

## 2013-12-26 NOTE — Progress Notes (Signed)
Patient seen and examined with NP S. Minor.  Lab, images, and vitals reviewed. Agree with NP S. Minor assessment and plan with the following exceptions:   1. Respiratory failure - possible secondary to TBI and recent HCAP, now with dec gag\cough reflex - s\p trach 9/1, size 8 shiley - cont with MV weaning protocol - monitor ABG, avoid alkalosis as this can lower seizure threshold  2. Aspiration PNA - cont with Vanc\Fortaz (Day 8/10) - patient with mild inc in wbc and evolving RLL inflitrate  3. Seizure d\o - assessing neuro status post trach, if still not waking up, will consider EEG and further neuro eval.   Critical Care Time =  [name], MD [facility] Pager [phone] On Call Pager [phone]

## 2013-12-26 NOTE — Progress Notes (Signed)
ANTICOAGULATION CONSULT NOTE   Pharmacy Consult for Lovenox Indication: Hx of DVT/PE  Allergies  Allergen Reactions  . Ativan [Lorazepam]     Pt has paradoxical effect with sedating medications.     Patient Measurements: Height:  (170.2 cm) Weight: 108 lb 14.5 oz (49.4 kg) IBW/kg (Calculated) : 66.1   Vital Signs: Temp: 98.4 F (36.9 C) (09/02 0800) Temp src: Axillary (09/02 0800) BP: 124/71 mmHg (09/02 1000) Pulse Rate: 91 (09/02 1000)  Labs:  Recent Labs  12/24/13 0345 12/24/13 1455 12/26/13 0340  HGB 11.2*  --  10.6*  HCT 34.8*  --  32.8*  PLT 220  --  232  APTT  --  42*  --   LABPROT  --  14.4  --   INR  --  1.12  --   CREATININE 0.51  --  0.53    Estimated Creatinine Clearance: 81.5 ml/min (by C-G formula based on Cr of 0.53).   Medical History: Past Medical History  Diagnosis Date  . 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 Seizure   . DVT (deep venous thrombosis)     UE, LE DVT during hospitalization  . H/O renal calculi     Medications:  Scheduled:  . antiseptic oral rinse  7 mL Mouth Rinse QID  . aspirin  81 mg Per Tube Daily  . chlorhexidine  15 mL Mouth Rinse BID  . clonazePAM  1 mg Per Tube QHS  . docusate  100 mg Per Tube BID  . enoxaparin (LOVENOX) injection  50 mg Subcutaneous Q12H  . feeding supplement (JEVITY 1.5 CAL/FIBER)  1,000 mL Per Tube Q24H  . free water  200 mL Per Tube Q6H  . insulin aspart  0-9 Units Subcutaneous 6 times per day  . metoprolol tartrate  12.5 mg Per Tube Q12H  . midazolam  4 mg Intravenous Once  . pantoprazole sodium  40 mg Per Tube Q24H  . QUEtiapine  100 mg Oral TID  . sennosides  5 mL Per Tube BID  . sodium chloride  3 mL Intravenous Q12H  . Valproic Acid  500 mg Per Tube Q12H   Infusions:  . sodium chloride 10 mL/hr at 12/24/13 0800  . dexmedetomidine Stopped (12/24/13 1900)   PRN: acetaminophen (TYLENOL) oral liquid 160 mg/5 mL, fentaNYL, LORazepam,  ondansetron (ZOFRAN) IV, polyethylene glycol, sodium phosphate  Assessment: 43 y/o M on Xarelto PTA for hx DVT/PE, converted to Lovenox while inpatient.  Lovenox was held for tracheostomy, which was performed 12/25/13.  Orders received today to resume full-dose Lovenox with pharmacy dosing assistance.  Goal of Therapy:  Full-dose anticoagulation with Lovenox.   Plan:  1. Resume Lovenox  ( /kg) SQ q12h 2. Follow CBC, SCr, clinical course.   Elie Goody, PharmD, BCPS Pager: [phone] 12/26/2013  10:36 AM

## 2013-12-26 NOTE — Progress Notes (Signed)
PULMONARY / CRITICAL CARE MEDICINE   Name: Hector [name] MRN: [id number] DOB: 1970-09-22    ADMISSION DATE:  12/11/2013 CONSULTATION DATE:  12/13/13  REFERRING MD :  Dr. Izola Price / [facility]   CHIEF COMPLAINT:  Seizure, concern for airway protection   INITIAL PRESENTATION: 43 yo male presented to ER for involuntary commitment.  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  He was noted to having jerking movement / possible seizures.  PCCM asked to assess with concern for airway protection/aspiration.  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 R airspace disease 8/21 Improved agitation, brady with precedex 8/26 Started Abx for HCAP, precedex d/c'ed 8/27 R arm weakness ?new >> neuro reconsulted 8/28 VDRF 8/30 Extubated 8/31 agitated and rhonchus  8/31 intubated for acute resp distress 9/1 tracheostomy   INTERVAL HISTORY:  More awake, will try T collar   VITAL SIGNS: Temp:  [96.6 F (35.9 C)-98.5 F (36.9 C)] 98.4 F (36.9 C) (09/02 0800) Pulse Rate:  [73-109] 97 (09/02 0808) Resp:  [14-32] 14 (09/02 0800) BP: (96-150)/(61-98) 128/75 mmHg (09/02 0808) SpO2:  [97 %-100 %] 100 % (09/02 0808) FiO2 (%):  [35 %-100 %] 35 % (09/02 0808) Weight:  [108 lb 14.5 oz (49.4 kg)] 108 lb 14.5 oz (49.4 kg) (09/02 0400)  VENT SETTINGS: Vent Mode:  [-] PRVC FiO2 (%):  [35 %-100 %] 35 % Set Rate:  [14 bmp] 14 bmp Vt Set:  [530 mL] 530 mL PEEP:  [5 cmH20] 5 cmH20 Plateau Pressure:  [15 cmH20-19  cmH20] 15 cmH20 V mask  INTAKE / OUTPUT: Intake/Output     09/01 0701 - 09/02 0700 09/02 0701 - 09/03 0700   I.V. 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 Skin: Intact  LABS:  CBC  Recent Labs Lab 12/23/13 0340 12/24/13 0345 12/26/13 0340  WBC 6.6 9.5 12.3*  HGB 10.5* 11.2* 10.6*  HCT 32.1* 34.8* 32.8*  PLT 185 220 232   BMET  Recent Labs Lab 12/23/13 0340 12/24/13 0345 12/26/13 0340  NA 140 140 136*  K 4.1 3.9 4.5  CL 100 101 97  CO2 BUN CREATININE 0.52 0.51 0.53  GLUCOSE 164* 152* 152*   Electrolytes  Recent Labs Lab 12/23/13 0340 12/24/13 0345 12/26/13 0340  CALCIUM 9.0 9.3 9.2    Sepsis Markers No results found for  this basename: LATICACIDVEN, PROCALCITON, O2SATVEN,  in the last 168 hours ABG  Recent Labs Lab 12/20/13 2305 12/24/13 1233 12/25/13 1023  PHART 7.455* 7.397 7.455*  PCO2ART 38.9 43.7 39.3  PO2ART 213.0* 442.0* 144.0*   Liver Enzymes  Recent Labs Lab 12/22/13 0319  AST 25  ALT 16  ALKPHOS 230*  BILITOT <0.2*  ALBUMIN 1.9*   Cardiac Enzymes  Recent Labs Lab 12/21/13 0038 12/21/13 0630 12/21/13 1211  TROPONINI <0.30 <0.30 <0.30   IMAGING:  Chest Portable 1 View To Assess Tube Placement And Rule-out Pneumothorax  12/25/2013   CLINICAL DATA:  Assess tube placement, rule out pneumothorax.  EXAM: PORTABLE CHEST - 1 VIEW  COMPARISON:  Chest radiograph December 24, 2013, chest radiograph October 21, 2013  FINDINGS: New tracheostomy tube tip projects 3.4 cm above the carina. No pneumothorax. New right middle lobe partial collapse. Mild chronic interstitial changes. Decrease bandlike density left midlung zone. No pleural effusions. No pneumothorax. Mild biapical pleural thickening.  Osteopenia.  Stable mid thoracic compression fracture.  IMPRESSION: Tracheostomy tube tip projects 3.4 cm above the carina.  Partial right middle lobe collapse. Decreased left midlung zone subsegmental atelectasis. Chronic interstitial changes.   Electronically Signed   By: Awilda Metro   On: 12/25/2013 13:07    ASSESSMENT / PLAN:  PULMONARY A:OTT 8/31(second)>> Acute hypoxemixc respiratory failre HCAP Tobacco use disorder P: Extubated 8/30, re intubated 8/31 Goal SpO2>92 Trend CXR  Trach 9/1 9/2 try t collar    CARDIOLOGY A: Sinus tachycardia H/o upper / lower extremity DVT on Xarelto preadmission P:  Goal MAP>65 Anticoagulation as below ASA Metoprolol  RENAL A: No active issues P:   Trend BMP Free water  GASTROINTESTINAL A: Dysphagia, s/p PEG Protein calorie malnutrition GI Px is not indicated P:   TF on hold for trach   HEMATOLOGIC A: Anemia of chronic disease Anticoagulation for h/o DVT P:  Monitor CBC Lovenox ( therapeutic dosing ) resume 9/2, consider resuming xarelto per g tube  INFECTIOUS A: Presumed HCAP, developed 8/26. P:   [name] 8/26>> day 7/7 stopped Vancomycin 8/27>>9/1  blood cx 8/26>>neg 8/27 sputum>>nl flora   ENDOCRINE A: No acute issues. 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 Parkinson symptoms? Seroquel 8/29 Valproate, Klonopin as preadmission PRN fentanyl Might need Psychiatry to re-assess when medically  stable Neurology following, no further imaging recommended. Hold sedation post trach. Wake up assessment. May near neuro to revisit. Pursue SNF placement.    [name] Gustave Lindeman ACNP [facility] PCCM Pager [phone] till 3 pm If no answer page [phone] 12/26/2013, 9:31 AM

## 2013-12-27 ENCOUNTER — Encounter: Payer: Medicaid Other | Admitting: Speech Pathology

## 2013-12-27 ENCOUNTER — Inpatient Hospital Stay (HOSPITAL_COMMUNITY): Payer: Medicaid Other

## 2013-12-27 LAB — CBC
HEMATOCRIT: 34.7 % — AB (ref 39.0–52.0)
Hemoglobin: 11.3 g/dL — ABNORMAL LOW (ref 13.0–17.0)
MCH: 27.2 pg (ref 26.0–34.0)
MCHC: 32.6 g/dL (ref 30.0–36.0)
MCV: 83.6 fL (ref 78.0–100.0)
Platelets: 305 10*3/uL (ref 150–400)
RBC: 4.15 MIL/uL — ABNORMAL LOW (ref 4.22–5.81)
RDW: 16.7 % — AB (ref 11.5–15.5)
WBC: 15.5 10*3/uL — AB (ref 4.0–10.5)

## 2013-12-27 LAB — GLUCOSE, CAPILLARY
GLUCOSE-CAPILLARY: 127 mg/dL — AB (ref 70–99)
GLUCOSE-CAPILLARY: 132 mg/dL — AB (ref 70–99)
Glucose-Capillary: 122 mg/dL — ABNORMAL HIGH (ref 70–99)
Glucose-Capillary: 122 mg/dL — ABNORMAL HIGH (ref 70–99)
Glucose-Capillary: 133 mg/dL — ABNORMAL HIGH (ref 70–99)
Glucose-Capillary: 114 mg/dL — ABNORMAL HIGH (ref 70–99)

## 2013-12-27 LAB — BASIC METABOLIC PANEL
Anion gap: 14 (ref 5–15)
BUN: 14 mg/dL (ref 6–23)
CHLORIDE: 97 meq/L (ref 96–112)
CO2: 27 mEq/L (ref 19–32)
CREATININE: 0.52 mg/dL (ref 0.50–1.35)
Calcium: 9.7 mg/dL (ref 8.4–10.5)
GFR calc non Af Amer: >90 mL/min (ref >90)
GLUCOSE: 140 mg/dL — AB (ref 70–99)
Potassium: 4.8 mEq/L (ref 3.7–5.3)
Sodium: 138 mEq/L (ref 137–147)

## 2013-12-27 LAB — MAGNESIUM: Magnesium: 2 mg/dL (ref 1.5–2.5)

## 2013-12-27 LAB — PHOSPHORUS: Phosphorus: 4.8 mg/dL — ABNORMAL HIGH (ref 2.3–4.6)

## 2013-12-27 MED ORDER — JEVITY 1.5 CAL/FIBER PO LIQD
1000.0000 mL | ORAL | Status: DC
  Administered 2013-12-27 – 2013-12-29 (×3): 1000 mL
  Filled 2013-12-27 (×4): qty 1000

## 2013-12-27 NOTE — Progress Notes (Signed)
Clinical Social Work Department BRIEF PSYCHOSOCIAL ASSESSMENT 12/27/2013  Patient:  Hector [name], Hector [name]     Account Number:  [id number]     Admit date:  12/11/2013  Clinical Social Worker:  Ulyess Blossom  Date/Time:  12/27/2013 03:30 PM  Referred by:  Physician  Date Referred:  12/27/2013 Referred for  SNF Placement   Other Referral:   Interview type:  Family Other interview type:    PSYCHOSOCIAL DATA Living Status:  FAMILY Admitted from facility:   Level of care:   Primary support name:  Hector [name]/father/[phone] Primary support relationship to patient:  PARENT Degree of support available:   strong    CURRENT CONCERNS Current Concerns  Post-Acute Placement   Other Concerns:    SOCIAL WORK ASSESSMENT / PLAN CSW received notification that pt placed on trach collar and trach collar stable at 28% FiO2 5 L/min. Per notes, pt family interested in exploring SNF placement.    CSW met with pt mother and pt father. CSW introduced self and explained role. Pt mother currently admitted to the hospital as well, but states that she is discharging today and feeling better. CSW provided supportive listening as pt parents shared with CSW about pt TBI and how pt was doing prior to admission. Pt mother became tearful and CSW provided emotional support. Pt mother stated that she is worried about what facility pt will end up going to and worried about if that place will be able to care for pt. CSW discussed the disciplines that the facilities have and that all these facilities also have physical therapy. CSW discussed process of SNF placement and explored with pt family about where they would be agreeable to pt going to as far as counties. Pt mother and pt father are hopeful for [facility], but agreeable to counties within a 50 mile radius. Pt family appreciative of CSW visit and support and assistance with exploring options for SNF.    CSW completed FL2 and initiated SNF search to  [facility], [place], [place], [facility].    CSW to follow up with pt parents regarding SNF bed offers.    CSW to continue to follow to assist with pt disposition needs.   Assessment/plan status:  Psychosocial Support/Ongoing Assessment of Needs Other assessment/ plan:   discharge planning   Information/referral to community resources:   [facility], [place], [facility] SNF list    PATIENT'S/FAMILY'S RESPONSE TO PLAN OF CARE: Pt unable to participate in assessment secondary to TBI; Trach collar. Pt parents are supportive and actively involved in pt care. Pt mother hopeful for a facility close by in order to be able to visit pt daily and provide support. Pt father stated that social security disability has been applied for, but has not yet been approved. Pt father plans to follow up on SSI disability in order to update this CSW. Pt parents appreciative of CSW visit and assistance.    Hector [name], MSW, [place] Work [phone]

## 2013-12-27 NOTE — Progress Notes (Signed)
PULMONARY / CRITICAL CARE MEDICINE   Name: Hector [name] MRN: [id number] DOB: 1971-04-03    ADMISSION DATE:  12/11/2013 CONSULTATION DATE:  12/13/13  REFERRING MD :  Dr. Izola Price / [facility]   CHIEF COMPLAINT:  Seizure, concern for airway protection   INITIAL PRESENTATION: 43 yo male presented to ER for involuntary commitment.  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  He was noted to having jerking movement / possible seizures.  PCCM asked to assess with concern for airway protection/aspiration.  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 R airspace disease 8/21 Improved agitation, brady with precedex 8/26 Started Abx for HCAP, precedex d/c'ed 8/27 R arm weakness ?new >> neuro reconsulted 8/28 VDRF 8/30 Extubated 8/31 agitated and rhonchus  8/31 intubated for acute resp distress 9/1 tracheostomy  9/3 24 hrs on trach collar    INTERVAL HISTORY:  More interactive, less sedated   VITAL SIGNS: Temp:  [98 F (36.7 C)-99.5 F (37.5 C)] 99.5 F (37.5 C) (09/03 0800) Pulse Rate:  [91-115] 95 (09/03 0900) Resp:  [20-37] 28 (09/03 0900) BP: (99-124)/(62-84) 112/75 mmHg (09/03 0800) SpO2:  [94 %-100 %] 98 % (09/03 0900) FiO2 (%):  [28 %] 28 % (09/03 0800) Weight:  [105 lb 13.1 oz (48 kg)] 105 lb 13.1 oz (48 kg) (09/03 0400)  VENT SETTINGS: Vent Mode:  [-]  FiO2 (%):  [28 %] 28 % V mask  INTAKE / OUTPUT: Intake/Output     09/02 0701 - 09/03 0700 09/03 0701 - 09/04 0700   I.V.  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 Skin: Intact  LABS:  CBC  Recent Labs Lab 12/24/13 0345 12/26/13 0340 12/27/13 0347  WBC 9.5 12.3* 15.5*  HGB 11.2* 10.6* 11.3*  HCT 34.8* 32.8* 34.7*  PLT 220 232 305   BMET  Recent Labs Lab 12/24/13 0345 12/26/13 0340 12/27/13 0347  NA 140 136* 138  K 3.9 4.5 4.8  CL 101 97 97  CO2 BUN CREATININE 0.51 0.53 0.52  GLUCOSE 152* 152* 140*   Electrolytes  Recent Labs Lab 12/24/13 0345 12/26/13 0340 12/27/13 0347  CALCIUM 9.3 9.2 9.7  MG  --   --  2.0  PHOS  --   --  4.8*    Sepsis Markers No results found for this basename: LATICACIDVEN, PROCALCITON, O2SATVEN,  in  the last 168 hours ABG  Recent Labs Lab 12/20/13 2305 12/24/13 1233 12/25/13 1023  PHART 7.455* 7.397 7.455*  PCO2ART 38.9 43.7 39.3  PO2ART 213.0* 442.0* 144.0*   Liver Enzymes  Recent Labs Lab 12/22/13 0319  AST 25  ALT 16  ALKPHOS 230*  BILITOT <0.2*  ALBUMIN 1.9*   Cardiac Enzymes  Recent Labs Lab 12/21/13 0038 12/21/13 0630 12/21/13 1211  TROPONINI <0.30 <0.30 <0.30   IMAGING:  Dg Chest Port 1 View  12/26/2013   CLINICAL DATA:  Atelectasis, tracheostomy tube.  EXAM: PORTABLE CHEST - 1 VIEW  COMPARISON:  12/25/2013  FINDINGS: Tracheostomy tube in place. Heart size upper normal. Elevated left hemidiaphragm. Right greater than left lower lobe airspace opacities. Small effusions not excluded. No pneumothorax. No acute osseous  finding. Multiple other wires and tubes are presumably external.  IMPRESSION: Increased bilateral lower lung airspace opacities ; atelectasis, aspiration, and/or pneumonia.   Electronically Signed   By: Jearld Lesch M.D.   On: 12/26/2013 06:07    ASSESSMENT / PLAN:  PULMONARY A:OTT 8/31(second)>> Acute hypoxemixc respiratory failre HCAP Tobacco use disorder P: Extubated 8/30, re intubated 8/31 Goal SpO2>92 Trend CXR  Trach 9/1 9/2  t collar as tolerated   CARDIOLOGY A: Sinus tachycardia H/o upper / lower extremity DVT on Xarelto preadmission P:  Goal MAP>65 Anticoagulation as below with lmwh ASA Metoprolol  RENAL A: No active issues P:   Trend BMP Free water(200 q 6 h) na 138  GASTROINTESTINAL A: Dysphagia, s/p PEG Protein calorie malnutrition GI Px is not indicated P:   TF    HEMATOLOGIC A: Anemia of chronic disease Anticoagulation for h/o DVT P:  Monitor CBC Lovenox ( therapeutic dosing ) resume 9/2, consider resuming xarelto per g tube  INFECTIOUS A: Presumed HCAP, developed 8/26. P:   [name] 8/26>> day 7/7 stopped Vancomycin 8/27>>9/1  blood cx 8/26>>neg 8/27 sputum>>nl flora   ENDOCRINE A: No acute issues. 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 Parkinson symptoms? Seroquel 8/29 Valproate, Klonopin as preadmission PRN fentanyl Might need Psychiatry to re-assess when medically stable Neurology following, no further imaging recommended. Hold sedation post trach. More awake and interactive Pursue SNF placement. Mobilize with PT therapy Change to SDU status    [name] Minor ACNP Ad[facility] Pollack PCCM Pager [phone] till 3 pm If no answer page [phone] 12/27/2013, 9:48 AM  Patient seen and examined with NP S. Minor.  Lab,  images, and vitals reviewed. Agree with NP S. Minor assessment and plan with the following exceptions:  1. Respiratory failure - s\p trach - now on TCT, if tolerate TCT for 48 hrs, will dc vent  2. Seizure disorder - cont with AEDs    Critical Care Time =  [name], MD [facility] Pager [phone] On Call Pager [phone]

## 2013-12-27 NOTE — Evaluation (Signed)
Physical Therapy Evaluation Patient Details Name: Hector [name] MRN: [id number] DOB: 1970-06-14 Today's Date: 12/27/2013   History of Present Illness  43 yo male presented to ER8/18/15 for involuntary commitment as patient had become unmanageable at home by parents.  While awaiting placement patient found to be in respiratory distress,transferred to ICU,  requiring ventilator. Trac on 9/1, tolerated trach collar.  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  Patient was attending [facility] for PT, OT <SP.  Clinical Impression  Pt awake and following simple commands and attempting to  communicate with mouthing word. Pt presents with dense RUE paresis, is moving R hip and knee but no noted ankle . Noted decreased PROM in ankle dorsiflexion on R. Recommend PRAFO for positioning.  No family present for information regarding his prior functional level. Pt will benefit from PT to address problems listed in note below. RECOMMEND OT AND SP CONSULTS.      Follow Up Recommendations SNF;Supervision/Assistance - 24 hour    Equipment Recommendations  None recommended by PT    Recommendations for Other Services OT consult;Speech consult     Precautions / Restrictions Precautions Precautions: Fall Precaution Comments: trach with secretions, R hemi UE, paresis RLE      Mobility  Bed Mobility Overal bed mobility: Needs Assistance;+2 for physical assistance;+ 2 for safety/equipment Bed Mobility: Supine to Sit;Sit to Supine     Supine to sit: +2 for physical assistance;Max assist Sit to supine: +2 for physical assistance;Max assist   General bed mobility comments: pt did assist  self to sitting by using L leg but unable to  Use R UE to assist, , assist with trunk and R leg onto bed.  Transfers                    Ambulation/Gait                Stairs            Wheelchair Mobility    Modified Rankin (Stroke Patients Only)       Balance Overall balance  assessment: Needs assistance Sitting-balance support: Feet supported;Single extremity supported Sitting balance-Leahy Scale: Poor Sitting balance - Comments: unable to use RUE for support. Patient sat x 10 minutes. BP 116/62 range.  Postural control: Right lateral lean                                   Pertinent Vitals/Pain Pain Assessment: Faces Faces Pain Scale: No hurt    Home Living Family/patient expects to be discharged to:: Skilled nursing facility                 Additional Comments: patient had been to rehab and was being cared for  at home, uncertain iof functional level. Has  a PEG on admit.    Prior Function           Comments: no family present     Hand Dominance        Extremity/Trunk Assessment   Upper Extremity Assessment: RUE deficits/detail;LUE deficits/detail RUE Deficits / Details: no  active movement noted     LUE Deficits / Details:  using WFL   Lower Extremity Assessment: RLE deficits/detail;LLE deficits/detail RLE Deficits / Details: no ankle  active movement, did extend knee and flex hip LLE Deficits / Details: active movement  through ROM in supine and sitting extept Dorsiflexion 3/5  Communication   Communication: Tracheostomy (pt did nod and mouth simple words to questions)  Cognition Arousal/Alertness: Awake/alert Behavior During Therapy: WFL for tasks assessed/performed Overall Cognitive Status: No family/caregiver present to determine baseline cognitive functioning                      General Comments      Exercises        Assessment/Plan    PT Assessment Patient needs continued PT services  PT Diagnosis Altered mental status;Generalized weakness (hemiparesis on R, )   PT Problem List Decreased strength;Decreased range of motion;Decreased activity tolerance;Decreased balance;Decreased mobility;Decreased knowledge of precautions;Decreased safety awareness;Decreased knowledge of use of  DME;Decreased cognition;Impaired tone  PT Treatment Interventions Functional mobility training;Therapeutic activities;Therapeutic exercise;Cognitive remediation;Neuromuscular re-education;Balance training;Patient/family education;Wheelchair mobility training   PT Goals (Current goals can be found in the Care Plan section) Acute Rehab PT Goals Patient Stated Goal: unable  PT Goal Formulation: Patient unable to participate in goal setting Time For Goal Achievement: 01/10/14 Potential to Achieve Goals: Fair    Frequency Min 3X/week   Barriers to discharge        Co-evaluation               End of Session   Activity Tolerance: Patient tolerated treatment well Patient left: in bed;with call bell/phone within reach;with bed alarm set           Time: 1610-9604 PT Time Calculation (min): 30 min   Charges:   PT Evaluation $Initial PT Evaluation Tier I: 1 Procedure PT Treatments $Neuromuscular Re-education: 23-37 mins   PT G Codes:          [name] 12/27/2013, 4:30 PM Blanchard Kelch PT [phone]

## 2013-12-27 NOTE — Progress Notes (Signed)
CARE MANAGEMENT NOTE 12/27/2013  Patient:  DMARION, PERFECT   Account Number:  [id number]  Date Initiated:  12/13/2013  Documentation initiated by:  Berenda Morale  Subjective/Objective Assessment:   See ED Case Management Note     Action/Plan:   Anticipated DC Date:  12/30/2013   Anticipated DC Plan:  SKILLED NURSING FACILITY  In-house referral  Clinical Social Worker      DC Planning Services  CM consult      [facility] Choice  NA   Choice offered to / List presented to:  NA      DME agency  NA     HH arranged  NA      HH agency  NA   Status of service:  In process, will continue to follow Medicare Important Message given?  NA - LOS <3 / Initial given by admissions (If response is "NO", the following Medicare IM given date fields will be blank) Date Medicare IM given:   Medicare IM given by:   Date Additional Medicare IM given:   Additional Medicare IM given by:    Discharge Disposition:    Per UR Regulation:  Reviewed for med. necessity/level of care/duration of stay  If discussed at Long Length of Stay Meetings, dates discussed:   12/18/2013  12/20/2013    Comments:  [id number]?Rhonda Davis,RN,BSN,CCM: Traqcheostomy performed on [id number]/patient weaned from vent to trache collar/ still confused but clearing.  [id number]/JYNWGN Davis,RNm,BSN,CCM: 1.) ct scan on [id number]: Remote left-sided infarction with encephalomalacia and ex vacuo dilatation of the left lateral ventricle, particularly the temporal horn. 2.) agitiation -iv percedex drip restarted on [id number]. 3.) intubated [id number] due to acute resp. resp. expected outcome: will see how patient progresses. [id number]/ZDGUYQ DAvis,RN,BSN,CCM: Fi02 up to 100% on a nonrebreathing mask/agitation is presisting but iv predcedex is off/iv abx cont/patient is a high risk for intubation. [id number]/DGLOVF Davis,RN,BSN,CCM: Chart reviewed for any dc needs.  Off iv precedex drip this a.m./psych med adjustment ongoing/pt was ivc'd  at admission /plan is to continue with this and possible updated pysch eval.  [id number]/OACZYS Davis,RN,BSN,CCM: Patient remains highly agitiated, confused., poss etoh w/d iv precedex.  12/13/13 Sandford Craze Rn, BSN, NCM Will await PT recommendations and assist with DC needs.

## 2013-12-28 LAB — CBC
HEMATOCRIT: 34.9 % — AB (ref 39.0–52.0)
HEMOGLOBIN: 11.3 g/dL — AB (ref 13.0–17.0)
MCH: 27 pg (ref 26.0–34.0)
MCHC: 32.4 g/dL (ref 30.0–36.0)
MCV: 83.5 fL (ref 78.0–100.0)
Platelets: 359 10*3/uL (ref 150–400)
RBC: 4.18 MIL/uL — ABNORMAL LOW (ref 4.22–5.81)
RDW: 16.9 % — AB (ref 11.5–15.5)
WBC: 15.3 10*3/uL — AB (ref 4.0–10.5)

## 2013-12-28 LAB — GLUCOSE, CAPILLARY
GLUCOSE-CAPILLARY: 119 mg/dL — AB (ref 70–99)
GLUCOSE-CAPILLARY: 111 mg/dL — AB (ref 70–99)
GLUCOSE-CAPILLARY: 104 mg/dL — AB (ref 70–99)
Glucose-Capillary: 129 mg/dL — ABNORMAL HIGH (ref 70–99)
Glucose-Capillary: 146 mg/dL — ABNORMAL HIGH (ref 70–99)
Glucose-Capillary: 129 mg/dL — ABNORMAL HIGH (ref 70–99)

## 2013-12-28 LAB — BASIC METABOLIC PANEL
ANION GAP: 13 (ref 5–15)
BUN: 18 mg/dL (ref 6–23)
CO2: 26 mEq/L (ref 19–32)
Calcium: 10 mg/dL (ref 8.4–10.5)
Chloride: 95 mEq/L — ABNORMAL LOW (ref 96–112)
Creatinine, Ser: 0.57 mg/dL (ref 0.50–1.35)
GFR calc Af Amer: >90 mL/min (ref >90)
GFR calc non Af Amer: >90 mL/min (ref >90)
Glucose, Bld: 131 mg/dL — ABNORMAL HIGH (ref 70–99)
Potassium: 4.4 mEq/L (ref 3.7–5.3)
Sodium: 134 mEq/L — ABNORMAL LOW (ref 137–147)

## 2013-12-28 NOTE — Progress Notes (Signed)
RN attempted to place new IV due to expiration of 2 IV 12/29/2013. Unsuccessful attempt. Will attempt later date.  Hector [name]

## 2013-12-28 NOTE — Progress Notes (Addendum)
Clinical Social Work Department CLINICAL SOCIAL WORK PLACEMENT NOTE 12/28/2013  Patient:  Hector [name], Hector [name]  Account Number:  [id number] Admit date:  12/11/2013  Clinical Social Worker:  Jacelyn Grip  Date/time:  12/27/2013 03:00 PM  Clinical Social Work is seeking post-discharge placement for this patient at the following level of care:   SKILLED NURSING   (*CSW will update this form in Epic as items are completed)   12/27/2013  Patient/family provided with [facility] Department of Clinical Social Work's list of facilities offering this level of care within the geographic area requested by the patient (or if unable, by the patient's family).  12/27/2013  Patient/family informed of their freedom to choose among providers that offer the needed level of care, that participate in Medicare, Medicaid or managed care program needed by the patient, have an available bed and are willing to accept the patient.  12/27/2013  Patient/family informed of [facility]' ownership interest in [facility], as well as of the fact that they are under no obligation to receive care at this facility.  PASARR submitted to EDS on 12/27/2013 PASARR number received on 12/27/2013  FL2 transmitted to all facilities in geographic area requested by pt/family on  12/27/2013 FL2 transmitted to all facilities within larger geographic area on   Patient informed that his/her managed care company has contracts with or will negotiate with  certain facilities, including the following:     Patient/family informed of bed offers received:  01/02/2014 Patient chooses bed at [facility] Physician recommends and patient chooses bed at    Patient to be transferred to  on  [facility] on 01/03/2014 Patient to be transferred to facility by ambulance Sharin Mons) Patient and family notified of transfer on 01/03/2014 Name of family member notified:  Pt mother, Harriett Sine notified via  telephone  The following physician request were entered in Epic:   Additional Comments:   Loletta Specter, MSW, LCSW Clinical Social Work [phone]

## 2013-12-28 NOTE — Progress Notes (Signed)
PULMONARY / CRITICAL CARE MEDICINE   Name: Hector [name] MRN: [id number] DOB: Jul 10, 1970    ADMISSION DATE:  12/11/2013 CONSULTATION DATE:  12/13/13  REFERRING MD :  Dr. Izola Price / [facility]   CHIEF COMPLAINT:  Seizure, concern for airway protection   INITIAL PRESENTATION: 43 yo male presented to ER for involuntary commitment.  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  He was noted to having jerking movement / possible seizures.  PCCM asked to assess with concern for airway protection/aspiration.  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 R airspace disease 8/21 Improved agitation, brady with precedex 8/26 Started Abx for HCAP, precedex d/c'ed 8/27 R arm weakness ?new >> neuro reconsulted 8/28 VDRF 8/30 Extubated 8/31 agitated and rhonchus  8/31 intubated for acute resp distress 9/1 tracheostomy  9/3 24 hrs on trach collar  9/4 48 hrs on trach collar - vent dc'ed   INTERVAL HISTORY:  More interactive, less sedated, tolerating trach collar wel.    VITAL SIGNS: Temp:  [98.1 F (36.7 C)-99 F (37.2 C)] 98.6 F (37 C) (09/04 0800) Pulse Rate:  [91-104] 96 (09/04 1000) Resp:  [0-40] 30 (09/04 1000) BP: (96-121)/(65-79) 97/65 mmHg (09/04 1000) SpO2:  [93 %-100 %] 98 % (09/04 1000) FiO2 (%):  [28 %] 28 % (09/04 0803) Weight:  [108 lb 11 oz (49.3 kg)] 108 lb 11 oz (49.3 kg) (09/04 0400)  VENT SETTINGS: Vent Mode:  [-]  FiO2 (%):  [28 %] 28 % V mask  INTAKE /  OUTPUT: Intake/Output     09/03 0701 - 09/04 0700 09/04 0701 - 09/05 0700   I.V. 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 Skin: Intact  LABS:  CBC  Recent Labs Lab 12/26/13 0340 12/27/13 0347 12/28/13 0340  WBC 12.3* 15.5* 15.3*  HGB 10.6* 11.3* 11.3*  HCT 32.8* 34.7* 34.9*  PLT 232 305 359   BMET  Recent Labs Lab 12/26/13 0340 12/27/13 0347 12/28/13 0340  NA 136* 138 134*  K 4.5 4.8 4.4  CL 97 97 95*  CO2 BUN CREATININE 0.53 0.52 0.57  GLUCOSE 152* 140* 131*   Electrolytes  Recent Labs Lab 12/26/13 0340 12/27/13 0347 12/28/13 0340  CALCIUM 9.2 9.7 10.0  MG  --  2.0  --   PHOS  --  4.8*  --     Sepsis Markers  No results found for this basename: LATICACIDVEN, PROCALCITON, O2SATVEN,  in the last 168 hours ABG  Recent Labs Lab 12/24/13 1233 12/25/13 1023  PHART 7.397 7.455*  PCO2ART 43.7 39.3  PO2ART 442.0* 144.0*   Liver Enzymes  Recent Labs Lab 12/22/13 0319  AST 25  ALT 16  ALKPHOS 230*  BILITOT <0.2*  ALBUMIN 1.9*   Cardiac Enzymes  Recent Labs Lab 12/21/13 1211  TROPONINI <0.30   IMAGING:  Dg Chest Port 1 View  12/27/2013   CLINICAL DATA:  Tracheostomy  EXAM: PORTABLE CHEST - 1 VIEW  COMPARISON:  12/25/2013  FINDINGS: Tracheostomy remains in good position.  Increase in bibasilar atelectasis with decreased lung volume. Underlying COPD. Pulmonary vascularity remains normal  IMPRESSION: Tracheostomy in good position.  Progression  of bibasilar atelectasis.   Electronically Signed   By: Marlan Palau M.D.   On: 12/27/2013 07:36    ASSESSMENT / PLAN:  PULMONARY A:OTT 8/31(second)>> Acute hypoxemixc respiratory failure - resolving HCAP Tobacco use disorder P: Extubated 8/30, re intubated 8/31 Goal SpO2>92 Trend CXR  Trach 9/1 9/4 48 hours of the vent, vent dc'ed   CARDIOLOGY A: Sinus tachycardia H/o upper / lower extremity DVT on Xarelto preadmission P:  Goal MAP>65 Anticoagulation as below with lmwh ASA Metoprolol  RENAL A: No active issues P:   Trend BMP Stop Free water(200 q 6 h) na 134  GASTROINTESTINAL A: Dysphagia, s/p PEG Protein calorie malnutrition GI Px is not indicated P:   TF    HEMATOLOGIC A: Anemia of chronic disease Anticoagulation for h/o DVT P:  Monitor CBC Lovenox ( therapeutic dosing ) resume 9/2, consider resuming xarelto per g tube  INFECTIOUS A: Presumed HCAP, developed 8/26. P:   [name] 8/26>>9/2  Vancomycin 8/27>>9/2  blood cx 8/26>>neg 8/27 sputum>>nl flora   ENDOCRINE A: No acute issues. 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 Parkinson symptoms? Seroquel 8/29 Valproate, Klonopin as preadmission PRN fentanyl Might need Psychiatry to re-assess when medically stable Neurology following, no further imaging recommended. Hold sedation post trach. More awake and interactive Pursue SNF placement. Mobilize with PT therapy Change to SDU status   Patient stable and not requiring ICU care at this, will transfer out of ICU. Transfer to triad (Dr.Meyers ), tele-bed.   Critical Care Time =  [name], MD [facility] Pager [phone] On Call Pager [phone]

## 2013-12-28 NOTE — Progress Notes (Signed)
CSW continuing to assist with disposition planning.  CSW diligently searching for SNF placement for pt. Current barrier is payor source for SNF placement as pt only has Medicaid [place] and has not yet been approved Medicaid.   CSW did Designer, television/film set and have not yet received any bed offers.   CSW discussed with Clinical Social Work Environmental manager, Wandra Mannan who recommended discussing with [facility] in [place], [place] regarding difficult to place bed at [facility]. Awaiting response from [facility].   CSW spoke with pt father, Teng Decou via telephone. CSW updated pt father regarding search for SNF and barriers at this time. CSW discussed with pt father that pt family will likely only have one option for SNF at this time given payor source. Pt father stated that he contacted Social Security Disability today and received notification that pt application will likely not be processed for another ten days. CSW expressed appreciation for pt father checking on status of Social Security Disability application. Pt father expressed understanding about the limitation with facilities at this time due to pt social security disability not yet being approved.   CSW to continue to follow and diligently search for option for SNF for pt.   Loletta Specter, MSW, LCSW Clinical Social Work [phone]

## 2013-12-29 LAB — GLUCOSE, CAPILLARY
GLUCOSE-CAPILLARY: 108 mg/dL — AB (ref 70–99)
GLUCOSE-CAPILLARY: 127 mg/dL — AB (ref 70–99)
GLUCOSE-CAPILLARY: 130 mg/dL — AB (ref 70–99)
Glucose-Capillary: 103 mg/dL — ABNORMAL HIGH (ref 70–99)
Glucose-Capillary: 144 mg/dL — ABNORMAL HIGH (ref 70–99)

## 2013-12-29 NOTE — Progress Notes (Signed)
ANTICOAGULATION CONSULT NOTE   Pharmacy Consult for Lovenox Indication: Hx of DVT/PE  Allergies  Allergen Reactions  . Ativan [Lorazepam]     Pt has paradoxical effect with sedating medications.     Patient Measurements: Height:  (170.2 cm) Weight: 105 lb 6.1 oz (47.8 kg) IBW/kg (Calculated) : 66.1   Vital Signs: Temp: 98.3 F (36.8 C) (09/05 0745) Temp src: Axillary (09/05 0745) BP: 102/63 mmHg (09/05 0941) Pulse Rate: 73 (09/05 1131)  Labs:  Recent Labs  12/27/13 0347 12/28/13 0340  HGB 11.3* 11.3*  HCT 34.7* 34.9*  PLT 305 359  CREATININE 0.52 0.57    Estimated Creatinine Clearance: 78.8 ml/min (by C-G formula based on Cr of 0.57).   Medical History: Past Medical History  Diagnosis Date  . 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 Seizure   . DVT (deep venous thrombosis)     UE, LE DVT during hospitalization  . H/O renal calculi     Medications:  Scheduled:  . antiseptic oral rinse  7 mL Mouth Rinse QID  . aspirin  81 mg Per Tube Daily  . chlorhexidine  15 mL Mouth Rinse BID  . clonazePAM  1 mg Per Tube QHS  . docusate  100 mg Per Tube BID  . enoxaparin (LOVENOX) injection  50 mg Subcutaneous Q12H  . free water  200 mL Per Tube Q6H  . insulin aspart  0-9 Units Subcutaneous 6 times per day  . metoprolol tartrate  12.5 mg Per Tube Q12H  . pantoprazole sodium  40 mg Per Tube Q24H  . QUEtiapine  100 mg Oral TID  . sennosides  5 mL Per Tube BID  . sodium chloride  3 mL Intravenous Q12H  . Valproic Acid  500 mg Per Tube Q12H   Infusions:  . sodium chloride 1,000 mL (12/29/13 0823)  . feeding supplement (JEVITY 1.5 CAL/FIBER) 1,000 mL (12/29/13 0900)   PRN: acetaminophen (TYLENOL) oral liquid 160 mg/5 mL, fentaNYL, LORazepam, ondansetron (ZOFRAN) IV, polyethylene glycol, sodium phosphate  Assessment: 43 y/o M on Xarelto PTA for hx DVT/PE, converted to Lovenox while inpatient.  Lovenox was held for  tracheostomy, which was performed 12/25/13.  Orders received to resume full-dose Lovenox with pharmacy dosing assistance on 9/2.  Noted plan to continue Lovenox sq while patient is in hospital.   CBC & SCr WNL and stable  Goal of Therapy:  Full-dose anticoagulation with Lovenox.   Plan:  1. Continue Lovenox  ( /kg) SQ q12h 2. Follow CBC, SCr, clinical course.   Loma Boston, PharmD Pager: [phone] 12/29/2013 1:13 PM

## 2013-12-29 NOTE — Progress Notes (Signed)
[name], male   DOB: [birth year], 43 y.o.   MRN: [id number]  [facility] PROGRESS NOTE  Hector [name] WUJ:[id number] DOB: [birth year] DOA: 12/11/2013 PCP: Hector Clonts, NP  Brief narrative: 43 yo male presented to ER for involuntary commitment. 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 He was noted to having jerking movement / possible seizures. PCCM asked to assess with concern for airway protection/aspiration.   STUDIES:  8/17 Renal US >> normal renal US  8/17 CTA Chest >> no acute disease, mild centrilobular emphysema  8/19 CT ABD/Pelvis >> moderate amt stool in large bowel, G-tube, no acute process, bilateral non-obs up to 3mm stones  8/27 CT   SIGNIFICANT EVENTS:  8/17 Seen in ER with Rt flank pain, urine retention  8/18 Seen in ER for agitation, involuntary commitment papers placed  8/20 Admitted to SDU after shaking / agitation, concerns for seizures & ? R airspace disease  8/21 Improved agitation, brady with precedex  8/26 Started Abx for HCAP, precedex d/c'ed  8/27 R arm weakness ?new >> neuro reconsulted  8/28 VDRF  8/30 Extubated  8/31 agitated and rhonchus  8/31 intubated for acute resp distress  9/1 tracheostomy  9/3 24 hrs on trach collar  9/4 48 hrs on trach collar - vent dc'ed  9/5 care transferred to [facility] and pt transferred to telemetry bed   Assessment and Plan:    Acute hypoxemixc respiratory failure - resolving  - from HCAP  - Extubated 8/30, re intubated 8/31  - Goal SpO2>92  - Trach 9/1  - 9/4 48 hours of the vent, vent dc'ed  Sinus tachycardia  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   Hector [name] 8/26>>9/2  - Vancomycin 8/27>>9/2  - blood cx 8/26>>neg  - 8/27 sputum>>nl flora  Severe agitated delirium  - with underlying traumatic brain injury after seizure / fall  - Expressive aphasia  - Residual RUE weakness (chronic)  - NAD this AM Hx of ETOH  - Preadmission Amantadine, Bromocriptine, Remeron on hold - indication? Parkinson symptoms?  - Might need Psychiatry to re-assess when medically stable  - Neurology following, no further imaging recommended.    DVT prophylaxis  Lovenox SQ while pt is in hospital  Code Status: Full Family Communication: Pt at bedside Disposition Plan: SNF when medically ready, in 1-2 days    Hector Presto, MD  [facility] Pager [phone]  If 7PM-7AM, please contact night-coverage [email or web address] Password [email or web address] 12/29/2013, 7:16 AM   LOS: 18 days   HPI/Subjective: No events overnight.   Objective: Filed Vitals:   12/29/13 0200 12/29/13 0300 12/29/13 0311 12/29/13 0400  BP: 92/67  92/67 98/75  Pulse: 81 75 73 80  Temp:    98.5 F (36.9 C)  TempSrc:    Oral  Resp: Height:      Weight:    47.8 kg (105 lb 6.1 oz)  SpO2: 96% 98% 100% 97%    Intake/Output Summary (Last 24 hours) at 12/29/13 0716 Last data filed at 12/29/13 0500  Gross per 24 hour  Intake  1270 ml  Output   1110 ml  Net    160 ml    Exam:   General:  Pt is alert, NAD  Cardiovascular: Regular rate and rhythm, S1/S2, no murmurs, no rubs, no gallops  Respiratory: Diminished air movement at bases   Abdomen: Soft, non tender, non distended, bowel sounds present, no guarding  Data Reviewed: Basic Metabolic Panel:  Recent Labs Lab 12/23/13 0340 12/24/13 0345 12/26/13 0340 12/27/13 0347 12/28/13 0340  NA 140 140 136* 138 134*  K 4.1 3.9 4.5 4.8 4.4  CL 100 101 97 97 95*  CO2 GLUCOSE 164* 152* 152* 140* 131*  BUN CREATININE 0.52 0.51 0.53 0.52 0.57  CALCIUM 9.0 9.3 9.2 9.7 10.0  MG  --   --   --  2.0   --   PHOS  --   --   --  4.8*  --    CBC:  Recent Labs Lab 12/23/13 0340 12/24/13 0345 12/26/13 0340 12/27/13 0347 12/28/13 0340  WBC 6.6 9.5 12.3* 15.5* 15.3*  HGB 10.5* 11.2* 10.6* 11.3* 11.3*  HCT 32.1* 34.8* 32.8* 34.7* 34.9*  MCV 83.4 83.9 83.9 83.6 83.5  PLT 185 220 232 305 359   CBG:  Recent Labs Lab 12/28/13 0759 12/28/13 1236 12/28/13 1545 12/28/13 1953 12/28/13 2357  GLUCAP 111* 146* 129* 104* 127*    Recent Results (from the past 240 hour(s))  CULTURE, BLOOD (ROUTINE X 2)     Status: None   Collection Time    12/19/13 10:05 AM      Result Value Ref Range Status   Specimen Description BLOOD LEFT HAND   Final   Special Requests BOTTLES DRAWN AEROBIC AND ANAEROBIC 5CC   Final   Culture  Setup Time     Final   Value: 12/19/2013 11:50     Performed at [facility]   Culture     Final   Value: NO GROWTH 5 DAYS     Performed at [facility]   Report Status 12/25/2013 FINAL   Final  CULTURE, BLOOD (ROUTINE X 2)     Status: None   Collection Time    12/19/13 10:15 AM      Result Value Ref Range Status   Specimen Description BLOOD RIGHT HAND   Final   Special Requests BOTTLES DRAWN AEROBIC AND ANAEROBIC 5CC   Final   Culture  Setup Time     Final   Value: 12/19/2013 11:50     Performed at [facility]   Culture     Final   Value: NO GROWTH 5 DAYS     Performed at [facility]   Report Status 12/25/2013 FINAL   Final  CULTURE, RESPIRATORY (NON-EXPECTORATED)     Status: None   Collection Time    12/20/13  3:00 PM      Result Value Ref Range Status   Specimen Description SPUTUM   Final   Special Requests NONE   Final   Gram Stain     Final   Value: FEW WBC PRESENT,BOTH PMN AND MONONUCLEAR     RARE SQUAMOUS EPITHELIAL CELLS PRESENT     RARE GRAM POSITIVE COCCI     IN PAIRS IN CLUSTERS     Performed at [facility]   Culture     Final   Value: NORMAL OROPHARYNGEAL FLORA     Performed at [facility]    Report Status 12/22/2013 FINAL   Final     Scheduled Meds: . antiseptic oral rinse  7 mL Mouth Rinse QID  . aspirin  81 mg Per Tube Daily  . chlorhexidine  15 mL Mouth Rinse BID  . clonazePAM  1 mg Per Tube QHS  . docusate  100 mg Per Tube BID  . enoxaparin (LOVENOX) injection  50 mg Subcutaneous Q12H  . free water  200 mL Per Tube Q6H  . insulin aspart  0-9 Units Subcutaneous 6 times per day  . metoprolol tartrate  12.5 mg Per Tube Q12H  . pantoprazole sodium  40 mg Per Tube Q24H  . QUEtiapine  100 mg Oral TID  . sennosides  5 mL Per Tube BID  . sodium chloride  3 mL Intravenous Q12H  . Valproic Acid  500 mg Per Tube Q12H   Continuous Infusions: . sodium chloride 10 mL/hr at 12/28/13 0800  . feeding supplement (JEVITY 1.5 CAL/FIBER) 1,000 mL (12/28/13 1021)

## 2013-12-30 LAB — BASIC METABOLIC PANEL
ANION GAP: 13 (ref 5–15)
BUN: 18 mg/dL (ref 6–23)
CO2: 26 mEq/L (ref 19–32)
Calcium: 9.5 mg/dL (ref 8.4–10.5)
Chloride: 98 mEq/L (ref 96–112)
Creatinine, Ser: 0.55 mg/dL (ref 0.50–1.35)
GFR calc Af Amer: >90 mL/min (ref >90)
GFR calc non Af Amer: >90 mL/min (ref >90)
GLUCOSE: 129 mg/dL — AB (ref 70–99)
Potassium: 4.1 mEq/L (ref 3.7–5.3)
SODIUM: 137 meq/L (ref 137–147)

## 2013-12-30 LAB — CBC
HCT: 33.6 % — ABNORMAL LOW (ref 39.0–52.0)
HEMOGLOBIN: 11.1 g/dL — AB (ref 13.0–17.0)
MCH: 28 pg (ref 26.0–34.0)
MCHC: 33 g/dL (ref 30.0–36.0)
MCV: 84.8 fL (ref 78.0–100.0)
PLATELETS: 422 10*3/uL — AB (ref 150–400)
RBC: 3.96 MIL/uL — ABNORMAL LOW (ref 4.22–5.81)
RDW: 16.7 % — ABNORMAL HIGH (ref 11.5–15.5)
WBC: 16.4 10*3/uL — ABNORMAL HIGH (ref 4.0–10.5)

## 2013-12-30 LAB — GLUCOSE, CAPILLARY
GLUCOSE-CAPILLARY: 126 mg/dL — AB (ref 70–99)
Glucose-Capillary: 135 mg/dL — ABNORMAL HIGH (ref 70–99)
Glucose-Capillary: 124 mg/dL — ABNORMAL HIGH (ref 70–99)
Glucose-Capillary: 107 mg/dL — ABNORMAL HIGH (ref 70–99)
Glucose-Capillary: 129 mg/dL — ABNORMAL HIGH (ref 70–99)
Glucose-Capillary: 109 mg/dL — ABNORMAL HIGH (ref 70–99)
Glucose-Capillary: 133 mg/dL — ABNORMAL HIGH (ref 70–99)

## 2013-12-30 MED ORDER — JEVITY 1.5 CAL/FIBER PO LIQD
1000.0000 mL | ORAL | Status: DC
  Administered 2013-12-30 – 2014-01-02 (×5): 1000 mL
  Filled 2013-12-30 (×4): qty 1000

## 2013-12-30 MED ORDER — RIVAROXABAN 20 MG PO TABS
20.0000 mg | ORAL_TABLET | Freq: Every day | ORAL | Status: DC
  Administered 2013-12-31 – 2014-01-02 (×3): 20 mg via ORAL
  Filled 2013-12-30 (×4): qty 1

## 2013-12-30 MED ORDER — RIVAROXABAN 20 MG PO TABS
20.0000 mg | ORAL_TABLET | Freq: Once | ORAL | Status: AC
  Administered 2013-12-30: 20 mg via ORAL
  Filled 2013-12-30: qty 1

## 2013-12-30 NOTE — Progress Notes (Signed)
[name], male   DOB: 1970/11/16, 43 y.o.   MRN: [id number]  [facility] PROGRESS NOTE  Hector [name] NWG:[id number] DOB: 12-05-1970 DOA: 12/11/2013 PCP: Mariea Clonts, NP  Brief narrative:  43 yo male presented to ER for involuntary commitment. 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 He was noted to having jerking movement / possible seizures. PCCM asked to assess with concern for airway protection/aspiration.   STUDIES:  8/17 Renal US >> normal renal US  8/17 CTA Chest >> no acute disease, mild centrilobular emphysema  8/19 CT ABD/Pelvis >> moderate amt stool in large bowel, G-tube, no acute process, bilateral non-obs up to 3mm stones  8/27 CT   SIGNIFICANT EVENTS:  8/17 Seen in ER with Rt flank pain, urine retention  8/18 Seen in ER for agitation, involuntary commitment papers placed  8/20 Admitted to SDU after shaking / agitation, concerns for seizures & ? R airspace disease  8/21 Improved agitation, brady with precedex  8/26 Started Abx for HCAP, precedex d/c'ed  8/27 R arm weakness ?new >> neuro reconsulted  8/28 VDRF  8/30 Extubated  8/31 agitated and rhonchus  8/31 intubated for acute resp distress  9/1 tracheostomy  9/3 24 hrs on trach collar  9/4 48 hrs on trach collar - vent dc'ed  9/5 care transferred to [facility]  Assessment and Plan:   Acute hypoxemixc respiratory failure - resolving  - from HCAP  - Extubated 8/30, re intubated 8/31  - Goal SpO2>92  - Trach 9/1  - 9/4 48 hours of the vent, vent dc'ed  - maintaining oxygen saturations at target range  Sinus tachycardia  - resolved, HR in 70's 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  [name] 8/26>>9/2  - Vancomycin 8/27>>9/2  - blood cx 8/26>>neg  - 8/27 sputum>>nl flora  Severe agitated delirium  - with underlying traumatic brain injury after seizure / fall  - Expressive aphasia  - Residual RUE weakness (chronic)  - NAD this AM  Hx of ETOH  - Preadmission Amantadine, Bromocriptine, Remeron on hold - indication? Parkinson symptoms?  - Might need Psychiatry to re-assess when medically stable  - Neurology following, no further imaging recommended.   DVT prophylaxis  Resume Xarelto   Code Status: Full  Family Communication: No family at bedside  Disposition Plan: SNF when medically ready, in 1-2 days   Debbora Presto, MD  [facility] Pager [phone]  If 7PM-7AM, please contact night-coverage [email or web address] Password [email or web address] 12/30/2013, 12:00 PM   LOS: 19 days   HPI/Subjective: No events overnight.   Objective: Filed Vitals:   12/30/13 0317 12/30/13 0400 12/30/13 0742 12/30/13 1011  BP:  114/67 115/60 112/73  Pulse: 82 86 67 86  Temp:  98.7 F (37.1 C) 98.5 F (36.9 C)   TempSrc:  Oral Oral   Resp: Height:      Weight:      SpO2: 95% 97% 100%     Intake/Output Summary (Last 24 hours) at 12/30/13 1200 Last data filed at 12/30/13 0600  Gross per 24 hour  Intake  1370 ml  Output   1225 ml  Net    145 ml    Exam:   General:  Pt is alert, not in acute distress  Cardiovascular: Regular rate and rhythm, S1/S2, no murmurs, no rubs, no gallops  Respiratory: Clear to auscultation bilaterally, no wheezing, diminished breath sounds at bases   Abdomen: Soft, non tender, non distended, bowel sounds present, no guarding  Data Reviewed: Basic Metabolic Panel:  Recent Labs Lab 12/24/13 0345 12/26/13 0340 12/27/13 0347 12/28/13 0340 12/30/13 0417  NA 140 136* 138 134* 137  K 3.9 4.5 4.8 4.4 4.1  CL 101 97 97 95* 98  CO2 GLUCOSE 152* 152* 140* 131* 129*  BUN CREATININE 0.51  0.53 0.52 0.57 0.55  CALCIUM 9.3 9.2 9.7 10.0 9.5  MG  --   --  2.0  --   --   PHOS  --   --  4.8*  --   --    CBC:  Recent Labs Lab 12/24/13 0345 12/26/13 0340 12/27/13 0347 12/28/13 0340 12/30/13 0417  WBC 9.5 12.3* 15.5* 15.3* 16.4*  HGB 11.2* 10.6* 11.3* 11.3* 11.1*  HCT 34.8* 32.8* 34.7* 34.9* 33.6*  MCV 83.9 83.9 83.6 83.5 84.8  PLT 220 232 305 359 422*   CBG:  Recent Labs Lab 12/29/13 1531 12/29/13 2042 12/30/13 0028 12/30/13 0452 12/30/13 0753  GLUCAP 135* 108* 126* 124* 107*    Recent Results (from the past 240 hour(s))  CULTURE, RESPIRATORY (NON-EXPECTORATED)     Status: None   Collection Time    12/20/13  3:00 PM      Result Value Ref Range Status   Specimen Description SPUTUM   Final   Special Requests NONE   Final   Gram Stain     Final   Value: FEW WBC PRESENT,BOTH PMN AND MONONUCLEAR     RARE SQUAMOUS EPITHELIAL CELLS PRESENT     RARE GRAM POSITIVE COCCI     IN PAIRS IN CLUSTERS     Performed at [facility]   Culture     Final   Value: NORMAL OROPHARYNGEAL FLORA     Performed at [facility]   Report Status 12/22/2013 FINAL   Final     Scheduled Meds: . antiseptic oral rinse  7 mL Mouth Rinse QID  . aspirin  81 mg Per Tube Daily  . chlorhexidine  15 mL Mouth Rinse BID  . clonazePAM  1 mg Per Tube QHS  . docusate  100 mg Per Tube BID  . enoxaparin (LOVENOX) injection  50 mg Subcutaneous Q12H  . free water  200 mL Per Tube Q6H  . insulin aspart  0-9 Units Subcutaneous 6 times per day  . metoprolol tartrate  12.5 mg Per Tube Q12H  . pantoprazole sodium  40 mg Per Tube Q24H  . QUEtiapine  100 mg Oral TID  . sennosides  5 mL Per Tube BID  . sodium chloride  3 mL Intravenous Q12H  . Valproic Acid  500 mg Per Tube Q12H   Continuous Infusions: . sodium chloride 1,000 mL (12/29/13 0823)  . feeding supplement (JEVITY 1.5 CAL/FIBER) 1,000 mL (12/30/13 0846)

## 2013-12-30 NOTE — Progress Notes (Signed)
ANTICOAGULATION CONSULT NOTE   Pharmacy Consult for Lovenox-> Xarelto Indication: Hx of DVT/PE  Allergies  Allergen Reactions  . Ativan [Lorazepam]     Pt has paradoxical effect with sedating medications.    Patient Measurements: Height:  (170.2 cm) Weight: 108 lb 3.9 oz (49.1 kg) IBW/kg (Calculated) : 66.1  Vital Signs: Temp: 98.5 F (36.9 C) (09/06 0742) Temp src: Oral (09/06 0742) BP: 112/73 mmHg (09/06 1011) Pulse Rate: 86 (09/06 1011)  Labs:  Recent Labs  12/28/13 0340 12/30/13 0417  HGB 11.3* 11.1*  HCT 34.9* 33.6*  PLT 359 422*  CREATININE 0.57 0.55   Estimated Creatinine Clearance: 81 ml/min (by C-G formula based on Cr of 0.55).  Medications:  Scheduled:  . antiseptic oral rinse  7 mL Mouth Rinse QID  . aspirin  81 mg Per Tube Daily  . chlorhexidine  15 mL Mouth Rinse BID  . clonazePAM  1 mg Per Tube QHS  . docusate  100 mg Per Tube BID  . free water  200 mL Per Tube Q6H  . insulin aspart  0-9 Units Subcutaneous 6 times per day  . metoprolol tartrate  12.5 mg Per Tube Q12H  . pantoprazole sodium  40 mg Per Tube Q24H  . QUEtiapine  100 mg Oral TID  . sennosides  5 mL Per Tube BID  . sodium chloride  3 mL Intravenous Q12H  . Valproic Acid  500 mg Per Tube Q12H   Assessment: 43 y/o M on Xarelto  daily (last dose 8/18) PTA for hx DVT/PE, converted to Lovenox while inpatient.  Lovenox was held for tracheostomy, which was performed 12/25/13.  Orders received to resume full-dose Lovenox with pharmacy dosing assistance on 9/2.  SQ Lovenox  bid while patient is in hospital.   Change to Xarelto 9/6   Plan:   Xarelto  daily, begin today with 2200 dose-last dose Lovenox given ~ 12N. Follow with Xarelto  daily at supper tomorrow.  Monitor CBC, renal function  Otho Bellows PharmD Pager [phone] 12/30/2013, 1:05 PM

## 2013-12-31 LAB — GLUCOSE, CAPILLARY
GLUCOSE-CAPILLARY: 133 mg/dL — AB (ref 70–99)
GLUCOSE-CAPILLARY: 132 mg/dL — AB (ref 70–99)
Glucose-Capillary: 111 mg/dL — ABNORMAL HIGH (ref 70–99)
Glucose-Capillary: 116 mg/dL — ABNORMAL HIGH (ref 70–99)
Glucose-Capillary: 143 mg/dL — ABNORMAL HIGH (ref 70–99)

## 2013-12-31 LAB — CBC
HEMATOCRIT: 35 % — AB (ref 39.0–52.0)
Hemoglobin: 11.3 g/dL — ABNORMAL LOW (ref 13.0–17.0)
MCH: 27.8 pg (ref 26.0–34.0)
MCHC: 32.3 g/dL (ref 30.0–36.0)
MCV: 86.2 fL (ref 78.0–100.0)
Platelets: 498 10*3/uL — ABNORMAL HIGH (ref 150–400)
RBC: 4.06 MIL/uL — AB (ref 4.22–5.81)
RDW: 16.6 % — ABNORMAL HIGH (ref 11.5–15.5)
WBC: 12.7 10*3/uL — AB (ref 4.0–10.5)

## 2013-12-31 LAB — BASIC METABOLIC PANEL
Anion gap: 12 (ref 5–15)
BUN: 16 mg/dL (ref 6–23)
CHLORIDE: 98 meq/L (ref 96–112)
CO2: 30 mEq/L (ref 19–32)
Calcium: 9.8 mg/dL (ref 8.4–10.5)
Creatinine, Ser: 0.66 mg/dL (ref 0.50–1.35)
GFR calc Af Amer: >90 mL/min (ref >90)
GFR calc non Af Amer: >90 mL/min (ref >90)
Glucose, Bld: 123 mg/dL — ABNORMAL HIGH (ref 70–99)
Potassium: 4.2 mEq/L (ref 3.7–5.3)
SODIUM: 140 meq/L (ref 137–147)

## 2013-12-31 NOTE — Progress Notes (Signed)
CSW continuing to follow.  CSW diligently seeking placement with the assistance of Clinical Social Work Chiropodist, [employer].   Pt likely will be placed as difficult to place, but awaiting feedback on bed availability.   CSW contacted [facility] regarding placement and facility states that they accept trach patients only when pt has had a trach for 30 days or more.   CSW discussed with Clinical Social Work Chiropodist, Wandra Mannan this morning.   CSW to continue to follow.  Loletta Specter, MSW, LCSW Clinical Social Work [phone]

## 2013-12-31 NOTE — Progress Notes (Signed)
[name], male   DOB: [birth year], 43 y.o.   MRN: [id number]  [facility] PROGRESS NOTE  Hector [name] WUJ:[id number] DOB: [birth year] DOA: 12/11/2013 PCP: Mariea Clonts, NP  Brief narrative:  43 yo male presented to ER for involuntary commitment. 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 He was noted to having jerking movement / possible seizures. PCCM asked to assess with concern for airway protection/aspiration.   STUDIES:  8/17 Renal US >> normal renal US  8/17 CTA Chest >> no acute disease, mild centrilobular emphysema  8/19 CT ABD/Pelvis >> moderate amt stool in large bowel, G-tube, no acute process, bilateral non-obs up to 3mm stones  8/27 CT   SIGNIFICANT EVENTS:  8/17 Seen in ER with Rt flank pain, urine retention  8/18 Seen in ER for agitation, involuntary commitment papers placed  8/20 Admitted to SDU after shaking / agitation, concerns for seizures & ? R airspace disease  8/21 Improved agitation, brady with precedex  8/26 Started Abx for HCAP, precedex d/c'ed  8/27 R arm weakness ?new >> neuro reconsulted  8/28 VDRF  8/30 Extubated  8/31 agitated and rhonchus  8/31 intubated for acute resp distress  9/1 tracheostomy  9/3 24 hrs on trach collar  9/4 48 hrs on trach collar - vent dc'ed  9/5 care transferred to [facility]  Assessment and Plan:   Acute hypoxemixc respiratory failure - resolving  - from HCAP  - Extubated 8/30, re intubated 8/31  - Goal SpO2>92  - Trach 9/1  - 9/4 48 hours of the vent, vent dc'ed  - maintaining oxygen saturations at target range  - transfer to med floor  Sinus tachycardia  - resolved, HR in 90's  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  [name] 8/26>>9/2  - Vancomycin 8/27>>9/2  - blood cx 8/26>>neg  - 8/27 sputum>>nl flora  Severe agitated delirium  - with underlying traumatic brain injury after seizure / fall  - Expressive aphasia  - Residual RUE weakness (chronic)  - NAD this AM  Hx of ETOH  - Preadmission Amantadine, Bromocriptine, Remeron on hold - indication? Parkinson symptoms?  - Might need Psychiatry to re-assess when medically stable  - Neurology following, no further imaging recommended.   DVT prophylaxis  Xarelto   Code Status: Full  Family Communication: No family at bedside  Disposition Plan: SNF vs LTAC   Debbora Presto, MD  [facility] Pager [phone]  If 7PM-7AM, please contact night-coverage [email or web address] Password [email or web address] 12/31/2013, 7:58 AM   LOS: 20 days   HPI/Subjective: No events overnight.   Objective: Filed Vitals:   12/31/13 0316 12/31/13 0400 12/31/13 0447 12/31/13 0500  BP: 113/64 104/68    Pulse: 89 86 86 90  Temp:  97.9 F (36.6 C)    TempSrc:  Oral    Resp: 34 34 22 35  Height:      Weight:    48.7 kg (107 lb 5.8 oz)  SpO2: 96% 96% 93% 93%    Intake/Output Summary (Last 24 hours) at 12/31/13 0758 Last data filed at 12/31/13 0600  Gross per 24 hour  Intake   2070 ml  Output   2085 ml  Net    -15 ml    Exam:   General:  Pt is alert, follows commands appropriately, not in acute distress  Cardiovascular: Regular rate and rhythm, S1/S2, no murmurs, no rubs, no gallops  Respiratory: Clear to auscultation bilaterally, no wheezing, diminished breath sounds at bases   Data Reviewed: Basic Metabolic Panel:  Recent Labs Lab 12/26/13 0340 12/27/13 0347 12/28/13 0340 12/30/13 0417 12/31/13 0340  NA 136* 138 134* 137 140  K 4.5 4.8 4.4 4.1 4.2  CL 97 97 95* 98 98  CO2 GLUCOSE 152* 140* 131* 129* 123*  BUN CREATININE 0.53 0.52 0.57 0.55 0.66  CALCIUM 9.2 9.7 10.0 9.5 9.8  MG  --   2.0  --   --   --   PHOS  --  4.8*  --   --   --    CBC:  Recent Labs Lab 12/26/13 0340 12/27/13 0347 12/28/13 0340 12/30/13 0417 12/31/13 0340  WBC 12.3* 15.5* 15.3* 16.4* 12.7*  HGB 10.6* 11.3* 11.3* 11.1* 11.3*  HCT 32.8* 34.7* 34.9* 33.6* 35.0*  MCV 83.9 83.6 83.5 84.8 86.2  PLT 232 305 359 422* 498*     Recent Labs Lab 12/30/13 1247 12/30/13 1558 12/30/13 2109 12/31/13 0032 12/31/13 0511  GLUCAP 129* 109* 133* 143* 116*    Scheduled Meds: . antiseptic oral rinse  7 mL Mouth Rinse QID  . aspirin  81 mg Per Tube Daily  . chlorhexidine  15 mL Mouth Rinse BID  . clonazePAM  1 mg Per Tube QHS  . docusate  100 mg Per Tube BID  . free water  200 mL Per Tube Q6H  . insulin aspart  0-9 Units Subcutaneous 6 times per day  . metoprolol tartrate  12.5 mg Per Tube Q12H  . pantoprazole sodium  40 mg Per Tube Q24H  . QUEtiapine  100 mg Oral TID  . rivaroxaban  20 mg Oral Q supper  . sennosides  5 mL Per Tube BID  . sodium chloride  3 mL Intravenous Q12H  . Valproic Acid  500 mg Per Tube Q12H   Continuous Infusions: . sodium chloride 1,000 mL (12/29/13 0823)  . feeding supplement (JEVITY 1.5 CAL/FIBER) 1,000 mL (12/31/13 0537)

## 2013-12-31 NOTE — Evaluation (Signed)
[facility] Speaking Valve - Evaluation Patient Details  Name: Hector [name] MRN: [id number] Date of Birth: March 01, 1971  Today's Date: 12/31/2013 Time: 4098-1191 SLP Time Calculation (min): 20 min  Past Medical History:  Past Medical History  Diagnosis Date  . 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 Seizure   . DVT (deep venous thrombosis)     UE, LE DVT during hospitalization  . H/O renal calculi    Past Surgical History:  Past Surgical History  Procedure Laterality Date  . Tracheostomy      feinstein   HPI:  43 y.o. with past medical history of TBI (traumatic brain injury 07/2013) and Seizure admitted with increased aggression toward family and grabbing at hisright flank/side.  Chest CT revealed groundglass opacities, abdominal CT revealed nonobstructive nephrolithiasis.  PT recommended ST assessment for cognition.  Pt. has trach and SLP requested eval for speaking valve.   Assessment / Plan / Recommendation Clinical Impression  Pt. easily awakened for PMSV assessment; # 8 cuffed and inflated trach.  SLP deflated cuff and pt. produced strong coughs, productive in expelling copious, thick, white colored mucous via trach.  Patent upper airway as evidenced by finger occlusion and sensation of air on SLP's gloved finger.  Pt. continued to cough vigorously and intermittently for 5 minutes, therefore cuff reinflated and coughing ceased.  Pt. mouthing words to this therapist; ST will continue to treat pt. with goal of PMSV tolerance (verbal expression, improved management of secretions).      SLP Assessment  Patient needs continued Speech Lanaguage Pathology Services    Follow Up Recommendations   (TBD)    Frequency and Duration min 2x/week  2 weeks   Pertinent Vitals/Pain WDL    SLP Goals Potential to Achieve Goals: Good Potential Considerations: Ability to learn/carryover information;Previous level of function;Cooperation/participation  level   PMSV Trial  PMSV was placed for:  (not placed) Able to redirect subglottic air through upper airway:  (N/A) Able to Expectorate Secretions: Yes (not wearing valve) Level of Secretion Expectoration with PMSV: Tracheal Behavior: Alert;Responsive to questions   Tracheostomy Tube       Vent Dependency  FiO2 (%): 28 %    Cuff Deflation Trial Tolerated Cuff Deflation: No Length of Time for Cuff Deflation Trial:  (approximatly 5-7 minutes) Behavior: Alert   Royce Macadamia 12/31/2013, 4:34 PM Breck Coons Lonell Face.Ed [employer] [phone]

## 2013-12-31 NOTE — Progress Notes (Signed)
CARE MANAGEMENT NOTE 12/31/2013  Patient:  Hector [name], Hector [name]   Account Number:  [id number]  Date Initiated:  12/13/2013  Documentation initiated by:  Berenda Morale  Subjective/Objective Assessment:   See ED Case Management Note     Action/Plan:   Anticipated DC Date:  01/01/2014   Anticipated DC Plan:  SKILLED NURSING FACILITY  In-house referral  Clinical Social Worker      DC Planning Services  CM consult      [facility] Choice  NA   Choice offered to / List presented to:  NA      DME agency  NA     HH arranged  NA      HH agency  NA   Status of service:  In process, will continue to follow Medicare Important Message given?  NA - LOS <3 / Initial given by admissions (If response is "NO", the following Medicare IM given date fields will be blank) Date Medicare IM given:   Medicare IM given by:   Date Additional Medicare IM given:   Additional Medicare IM given by:    Discharge Disposition:    Per UR Regulation:  Reviewed for med. necessity/level of care/duration of stay  If discussed at Long Length of Stay Meetings, dates discussed:   12/18/2013  12/20/2013    Comments:  [id number]/NFAOZHYQMVH,QI,ONG,EXB: patient to be transferred from icu to med- floor/on trache collar at 28% o2 and tolerating well.  Csw is working on placement. [id number]?Rhonda Davis,RN,BSN,CCM: Tracheostomy performed on [id number]/patient weaned from vent to trache collar/ still confused but clearing.  [id number]/GUYQIH Davis,RNm,BSN,CCM: 1.) ct scan on [id number]: Remote left-sided infarction with encephalomalacia and ex vacuo dilatation of the left lateral ventricle, particularly the temporal horn. 2.) agitiation -iv percedex drip restarted on [id number]. 3.) intubated [id number] due to acute resp. resp. expected outcome: will see how patient progresses. [id number]/WFUXNA DAvis,RN,BSN,CCM: Fi02 up to 100% on a nonrebreathing mask/agitation is presisting but iv predcedex is off/iv abx cont/patient is a high  risk for intubation. [id number]/URKYHC Davis,RN,BSN,CCM: Chart reviewed for any dc needs.  Off iv precedex drip this a.m./psych med adjustment ongoing/pt was ivc'd at admission /plan is to continue with this and possible updated pysch eval.  [id number]/TDVVOH Davis,RN,BSN,CCM: Patient remains highly agitiated, confused., poss etoh w/d iv precedex.  12/13/13 Sandford Craze Rn, BSN, NCM Will await PT recommendations and assist with DC needs.

## 2013-12-31 NOTE — Evaluation (Signed)
Speech Language Pathology Evaluation Patient Details Name: Hector [name] MRN: [id number] DOB: 07-05-1970 Today's Date: 12/31/2013 Time: 4098-1191 SLP Time Calculation (min): 7 min  Problem List:  Patient Active Problem List   Diagnosis Date Noted  . Acute respiratory failure with hypoxia 12/20/2013  . Delirium due to general medical condition 12/13/2013  . HCAP (healthcare-associated pneumonia) 12/13/2013  . Sepsis 12/13/2013  . TBI (traumatic brain injury) 12/12/2013   Past Medical History:  Past Medical History  Diagnosis Date  . 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 Seizure   . DVT (deep venous thrombosis)     UE, LE DVT during hospitalization  . H/O renal calculi    Past Surgical History:  Past Surgical History  Procedure Laterality Date  . Tracheostomy      feinstein   HPI:  43 y.o. with past medical history of TBI (traumatic brain injury 07/2013) and Seizure admitted with increased aggression toward family and grabbing at hisright flank/side.  Chest CT revealed groundglass opacities, abdominal CT revealed nonobstructive nephrolithiasis.  PT recommended ST assessment for cognition.  Pt. has trach and SLP requested eval for speaking valve.   Assessment / Plan / Recommendation Clinical Impression  Pt. exhibited behaviors similar to Ranch[place] V (confused;inappropriate;non-agitiated).  Pt. given max verbal and visual assist to establish a yes/no (thumb up/down) response that was inconsistent and mostly non functional at present time.  Pt. mouthing words in what appeared to be phrases which were indecipherable (pt. unable to utilize PMSV at this time).  Cognitive deficits evident in the areas of sustained attention, problem solving and intellectual and emergent awareness.  SLP will continue to work with pt. in treatment and diagnostic assessment of expression and cognition.       SLP Assessment  Patient needs continued Speech Lanaguage  Pathology Services    Follow Up Recommendations   (TBD)    Frequency and Duration min 2x/week  2 weeks   Pertinent Vitals/Pain Pain Assessment: No/denies pain (no indications pain)   SLP Goals  SLP Goals Potential to Achieve Goals:  (fair-good) Potential Considerations: Ability to learn/carryover information;Previous level of function;Cooperation/participation level  SLP Evaluation Prior Functioning  Cognitive/Linguistic Baseline: Baseline deficits Baseline deficit details:  (TBI 07/2013)   Cognition  Overall Cognitive Status: History of cognitive impairments - at baseline (no family present to confirm baseline status) Arousal/Alertness: Awake/alert Orientation Level: Disoriented to time;Oriented to place (assessed via yes/no questions) Attention: Sustained Sustained Attention: Impaired Sustained Attention Impairment: Functional basic Memory:  (to be assessed) Awareness: Impaired Awareness Impairment: Intellectual impairment;Emergent impairment;Anticipatory impairment Problem Solving: Impaired Problem Solving Impairment: Functional basic Behaviors:  (somewhat irritated with SLP) Safety/Judgment: Impaired Ranch[place] [employer] Scales of Cognitive Functioning: Confused/inappropriate/non-agitated    Comprehension  Auditory Comprehension Overall Auditory Comprehension: Impaired Yes/No Questions: Impaired Basic Biographical Questions: 51-75% accurate Commands: Impaired One Step Basic Commands: 25-49% accurate (around 25%) Interfering Components: Attention Visual Recognition/Discrimination Discrimination: Not tested Reading Comprehension Reading Status: Not tested    Expression Expression Primary Mode of Expression: Nonverbal - gestures Verbal Expression Overall Verbal Expression:  (mouthing words, difficult for SLP to understand) Initiation: Impaired Level of Generative/Spontaneous Verbalization: Phrase (appeared phrases from mouthing) Naming: Not tested Pragmatics:  Impairment Impairments: Abnormal affect;Eye contact;Interpretation of nonverbal communication Interfering Components: Attention Non-Verbal Means of Communication:  (not functional) Written Expression Dominant Hand:  (TBD) Written Expression:  (TBD)   Oral / Motor Oral Motor/Sensory Function Overall Oral Motor/Sensory Function:  (unable to accurately assess)  Motor Speech Overall Motor Speech:  (unable to assess)   GO     Royce Macadamia 12/31/2013, 4:55 PM Breck Coons Lonell Face.Ed [employer] [phone]

## 2014-01-01 ENCOUNTER — Ambulatory Visit: Payer: Medicaid Other

## 2014-01-01 ENCOUNTER — Encounter: Payer: Medicaid Other | Admitting: Speech Pathology

## 2014-01-01 LAB — GLUCOSE, CAPILLARY
GLUCOSE-CAPILLARY: 122 mg/dL — AB (ref 70–99)
Glucose-Capillary: 101 mg/dL — ABNORMAL HIGH (ref 70–99)
Glucose-Capillary: 102 mg/dL — ABNORMAL HIGH (ref 70–99)
Glucose-Capillary: 114 mg/dL — ABNORMAL HIGH (ref 70–99)
Glucose-Capillary: 123 mg/dL — ABNORMAL HIGH (ref 70–99)
Glucose-Capillary: 127 mg/dL — ABNORMAL HIGH (ref 70–99)
Glucose-Capillary: 93 mg/dL (ref 70–99)

## 2014-01-01 MED ORDER — DOCUSATE SODIUM 50 MG/5ML PO LIQD: 100.0000 mg | Freq: Two times a day (BID) | ORAL | Status: DC

## 2014-01-01 MED ORDER — VALPROIC ACID 250 MG/5ML PO SYRP: 500.0000 mg | ORAL_SOLUTION | Freq: Two times a day (BID) | ORAL | Status: DC

## 2014-01-01 MED ORDER — OXYCODONE-ACETAMINOPHEN 5-325 MG PO TABS: 1.0000 | ORAL_TABLET | Freq: Four times a day (QID) | ORAL | Status: DC | PRN

## 2014-01-01 MED ORDER — METOPROLOL TARTRATE 25 MG/10 ML ORAL SUSPENSION: 12.5000 mg | Freq: Two times a day (BID) | ORAL | Status: DC

## 2014-01-01 MED ORDER — CLONAZEPAM 1 MG PO TABS: 1.0000 mg | ORAL_TABLET | Freq: Every day | ORAL | Status: DC

## 2014-01-01 MED ORDER — QUETIAPINE FUMARATE 100 MG PO TABS: 100.0000 mg | ORAL_TABLET | Freq: Three times a day (TID) | ORAL | Status: DC

## 2014-01-01 MED ORDER — PANTOPRAZOLE SODIUM 40 MG PO PACK: 40.0000 mg | PACK | ORAL | Status: DC

## 2014-01-01 NOTE — Evaluation (Signed)
Occupational Therapy Evaluation Patient Details Name: Hector [name] MRN: [id number] DOB: 12/14/1970 Today's Date: 01/01/2014    History of Present Illness 43 yo male presented to ER8/18/15 for involuntary commitment as patient had become unmanageable at home by parents.  While awaiting placement patient found to be in respiratory distress,transferred to ICU,  requiring ventilator. Trac on 9/1, tolerated trach collar.  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  Patient was attending [facility] for PT, OT <SP.   Clinical Impression   Pt was admitted for the above.  He now presents with trach collar.  Pt will benefit from skilled OT to increase participation with ADLs to decrease burden of care.  Focus will be on balance, activity tolerance, and cognition to support these tasks.      Follow Up Recommendations  SNF    Equipment Recommendations  None recommended by OT    Recommendations for Other Services       Precautions / Restrictions Precautions Precautions: Fall Precaution Comments: trach with secretions, R hemi UE, paresis RLE Restrictions Weight Bearing Restrictions: No      Mobility Bed Mobility   Bed Mobility: Supine to Sit;Sit to Supine Rolling: Max assist   Supine to sit: +2 for physical assistance;Max assist Sit to supine: +2 for physical assistance;Max assist   General bed mobility comments: verbal and tactile cues to initiate  Transfers Overall transfer level: Needs assistance Equipment used: Rolling walker (2 wheeled) (used RW turned to side so that handle was in front of him ) Transfers: Sit to/from Stand Sit to Stand: +2 physical assistance;Max assist         General transfer comment: assist to rise and balance support    Balance   Sitting-balance support: Feet supported;Single extremity supported Sitting balance-Leahy Scale: Poor Sitting balance - Comments: RUE placed on bed--minimal weight bearing   Standing balance support: Single extremity  supported Standing balance-Leahy Scale: Zero Standing balance comment: used one handle of walker for support                            ADL Overall ADL's : Needs assistance/impaired Eating/Feeding: NPO   Grooming: Wash/dry face;Bed level;Maximal assistance   Upper Body Bathing: Maximal assistance;Bed level   Lower Body Bathing: Total assistance;+2 for physical assistance;Bed level   Upper Body Dressing : Maximal assistance;Bed level   Lower Body Dressing: +2 for physical assistance;Total assistance;Sit to/from stand                 General ADL Comments: Pt did assist with pulling L sock up:  attempted to don with HOB raised, but he couldn't reach.  Pt needs support of bed or chair to perform ADLs.  Sat EOB with min guard to mod A to maintain balance.  Prolonged coughing.  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  Pt does follow commands, delay in initiating at times.                       General Comments       Exercises       Shoulder Instructions      Home Living Family/patient expects to be discharged to:: Skilled nursing facility                                        Prior  Functioning/Environment Level of Independence: Needs assistance             OT Diagnosis: Generalized weakness;Cognitive deficits   OT Problem List: Decreased strength;Decreased activity tolerance;Impaired balance (sitting and/or standing);Decreased cognition;Impaired UE functional use   OT Treatment/Interventions: Self-care/ADL training;DME and/or AE instruction;Patient/family education;Therapeutic activities;Cognitive remediation/compensation;Balance training    OT Goals(Current goals can be found in the care plan section) Acute Rehab OT Goals Patient Stated Goal: unable to state OT Goal Formulation: Patient unable to participate in goal setting Time For Goal Achievement: 01/15/14 Potential to Achieve Goals: Fair ADL Goals Pt Will Perform Grooming: with min assist;sitting (supported) Pt Will Perform Upper Body Bathing: with min assist;sitting (supported) Pt Will Perform Upper Body Dressing: with mod assist;sitting (supported) Additional ADL Goal #1: pt will maintain unsupported sitting in preparation for adls with min guard x 3 minutes Additional ADL Goal #2: pt will sustain attention to ADL task for 3 minutes without redirection with multimodal cues  OT Frequency: Min 2X/week   Barriers to D/C:            Co-evaluation PT/OT/SLP Co-Evaluation/Treatment: Yes     OT goals addressed during session: ADL's and self-care;Strengthening/ROM      End of Session Nurse Communication:  (sats)  Activity Tolerance: Patient limited by fatigue Patient left: in bed;with call bell/phone within reach;with bed alarm set;with nursing/sitter in room   Time: 1610-9604 OT Time Calculation (min): 47 min Charges:  OT General Charges $OT Visit: 1 Procedure OT Evaluation $Initial OT Evaluation Tier I: 1 Procedure OT Treatments $Self Care/Home Management : 8-22 mins G-Codes:    Heydy Montilla 01-24-2014, 2:24 PM  Marica Otter, OTR/L [phone] 01-24-2014

## 2014-01-01 NOTE — Progress Notes (Signed)
SLP Cancellation Note  Patient Details Name: Hector [name] MRN: [id number] DOB: [birth year]   Cancelled treatment:       Reason Eval/Treat Not Completed: Medical issues which prohibited therapy (Per RN, pt with increased/copious secretions today, requiring frequent suctioning to maintain O2 sats. Will continue efforts next date.)  Celia B. Murvin Natal [facility], CCC-SLP [phone]  Leigh Aurora 01/01/2014, 2:23 PM

## 2014-01-01 NOTE — Discharge Summary (Signed)
Physician Discharge Summary  Hector [name] FAO:[id number] DOB: 1971-03-13 DOA: 12/11/2013  PCP: Mariea Clonts, NP  Admit date: 12/11/2013 Discharge date: 01/02/2014  Recommendations for Outpatient Follow-up:  1. Pt will need to follow up with PCP in 2-3 weeks post discharge 2. Please obtain BMP to evaluate electrolytes and kidney function 3. Please also check CBC to evaluate Hg and Hct levels  Discharge Diagnoses: Acute hypoxic respiratory failure secondary to HCAP  Active Problems:   TBI (traumatic brain injury)   Delirium due to general medical condition   HCAP (healthcare-associated pneumonia)   Sepsis   Acute respiratory failure with hypoxia  Discharge Condition: Stable  Diet recommendation: Heart healthy diet discussed in details   Brief narrative:  43 yo male presented to ER for involuntary commitment. 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 He was noted to having jerking movement / possible seizures. PCCM asked to assess with concern for airway protection/aspiration.   STUDIES:  8/17 Renal US >> normal renal US  8/17 CTA Chest >> no acute disease, mild centrilobular emphysema  8/19 CT ABD/Pelvis >> moderate amt stool in large bowel, G-tube, no acute process, bilateral non-obs up to 3mm stones  8/27 CT  SIGNIFICANT EVENTS:  8/17 Seen in ER with Rt flank pain, urine retention  8/18 Seen in ER for agitation, involuntary commitment papers placed  8/20 Admitted to SDU after shaking / agitation, concerns for seizures & ? R airspace disease  8/21 Improved agitation, brady with precedex  8/26 Started Abx for HCAP, precedex d/c'ed  8/27 R arm weakness ?new >> neuro reconsulted  8/28 VDRF  8/30 Extubated  8/31 agitated and rhonchus  8/31 intubated for acute resp distress  9/1 tracheostomy  9/3 24 hrs on trach collar  9/4 48 hrs on trach collar - vent dc'ed  9/5 care transferred to [facility] and pt transferred to telemetry bed   Assessment and Plan:   Acute  hypoxemixc respiratory failure - resolving  - from HCAP  - Extubated 8/30, re intubated 8/31  - Goal SpO2>92  - Trach 9/1  - 9/4 48 hours of the vent, vent dc'ed  - maintaining oxygen saturations at target range  - pt stable clinically this AM  - d/c to SNF in progress  Sinus tachycardia  - resolved, HR in 90's  - continue Metoprolol per tube  H/o upper / lower extremity DVT on Xarelto preadmission  - Goal MAP>65  - Anticoagulation as below with lmwh but will resume xarelto in AM  - continue Metoprolol  Protein calorie malnutrition, severe  - in the setting of TBI  - PEG in place  - resumed feedings with no problems  Anemia of chronic disease  - no signs of active bleeding  - resumed Xarelto 9/6  Presumed HCAP, developed 8/26.  [name] 8/26>>9/2  - Vancomycin 8/27>>9/2  - blood cx 8/26>>neg  - 8/27 sputum>>nl flora  Severe agitated delirium  - with underlying traumatic brain injury after seizure / fall  - Expressive aphasia  - Residual RUE weakness (chronic)  - NAD this AM  Hx of ETOH  - Preadmission Amantadine, Bromocriptine, Remeron on hold - indication? Parkinson symptoms?  - Might need Psychiatry to re-assess when medically stable  - Neurology following, no further imaging recommended.   DVT prophylaxis  Xarelto   Code Status: Full  Family Communication: No family at bedside  Disposition Plan: SNF wen bed available   Discharge Exam: Filed Vitals:   01/01/14 1153  BP:   Pulse: 90  Temp:   Resp: 16   Filed Vitals:   01/01/14 0600 01/01/14 0911 01/01/14 1037 01/01/14 1153  BP: 111/68  103/65   Pulse: 80 73 75 90  Temp: 97.8 F (36.6 C)     TempSrc: Oral     Resp: Height:      Weight:      SpO2: 96% 95% 100% 98%    General: Pt is alert, answers simple questions by saying yes and no, follows some simple commands  Cardiovascular: Regular rate and rhythm, no rubs, no gallops Respiratory: Clear to auscultation bilaterally, no wheezing,  diminished breath sounds at bases with scattered rhonchi  Abdominal: Soft, non tender, non distended, bowel sounds +, no guarding Extremities: no edema, no cyanosis, pulses palpable bilaterally DP and PT  Discharge Instructions    Medication List    STOP taking these medications       risperiDONE 1 MG/ML oral solution  Commonly known as:  RISPERDAL      TAKE these medications       amantadine 50 MG/5ML solution  Commonly known as:  SYMMETREL  Take 100 mg by mouth 2 (two) times daily.     bromocriptine 2.5 MG tablet  Commonly known as:  PARLODEL  Take 5 mg by mouth 2 (two) times daily.     clonazePAM 1 MG tablet  Commonly known as:  KLONOPIN  Place 1 tablet (1 mg total) into feeding tube at bedtime.     docusate 50 MG/5ML liquid  Commonly known as:  COLACE  Place 10 mLs (100 mg total) into feeding tube 2 (two) times daily.     metoprolol tartrate 25 mg/10 mL Susp  Commonly known as:  LOPRESSOR  Place 5 mLs (12.5 mg total) into feeding tube every 12 (twelve) hours.     mirtazapine 15 MG tablet  Commonly known as:  REMERON  Take 15 mg by mouth at bedtime.     ondansetron 4 MG tablet  Commonly known as:  ZOFRAN  Take 1 tablet (4 mg total) by mouth every 6 (six) hours.     oxyCODONE-acetaminophen 5-325 MG per tablet  Commonly known as:  PERCOCET/ROXICET  Take 1-2 tablets by mouth every 6 (six) hours as needed for moderate pain or severe pain.     pantoprazole sodium 40 mg/20 mL Pack  Commonly known as:  PROTONIX  Place 20 mLs (40 mg total) into feeding tube daily.     polyethylene glycol packet  Commonly known as:  MIRALAX / GLYCOLAX  Take 17 g by mouth daily as needed for mild constipation.     QUEtiapine 100 MG tablet  Commonly known as:  SEROQUEL  Take 1 tablet (100 mg total) by mouth 3 (three) times daily.     rivaroxaban 20 MG Tabs tablet  Commonly known as:  XARELTO  Take 20 mg by mouth daily.     Valproic Acid 250 MG/5ML Syrp syrup  Commonly known  as:  DEPAKENE  Place 10 mLs (500 mg total) into feeding tube every 12 (twelve) hours.           Follow-up Information   Follow up with KRAFT, HOWARD A. On 03/04/2014. (neurology appointment on 03/04/14 with Dr Caswell Corwin )    Specialty:  Cardiology   Contact information:   [address] [place] [address] [place] [address] [phone]       Schedule an appointment as soon as possible  for a visit with FOSTER, CHRISTOPHER B, NP.   Specialty:  Nurse Practitioner   Contact information:   [address] [place] [address] [phone]       Follow up with Debbora Presto, MD. (As needed call my cell phone [phone])    Specialty:  Internal Medicine   Contact information:   [address] [place] [address] [phone]        The results of significant diagnostics from this hospitalization (including imaging, microbiology, ancillary and laboratory) are listed below for reference.     Microbiology: No results found for this or any previous visit (from the past 240 hour(s)).   Labs: Basic Metabolic Panel:  Recent Labs Lab 12/26/13 0340 12/27/13 0347 12/28/13 0340 12/30/13 0417 12/31/13 0340  NA 136* 138 134* 137 140  K 4.5 4.8 4.4 4.1 4.2  CL 97 97 95* 98 98  CO2 GLUCOSE 152* 140* 131* 129* 123*  BUN CREATININE 0.53 0.52 0.57 0.55 0.66  CALCIUM 9.2 9.7 10.0 9.5 9.8  MG  --  2.0  --   --   --   PHOS  --  4.8*  --   --   --    CBC:  Recent Labs Lab 12/26/13 0340 12/27/13 0347 12/28/13 0340 12/30/13 0417 12/31/13 0340  WBC 12.3* 15.5* 15.3* 16.4* 12.7*  HGB 10.6* 11.3* 11.3* 11.1* 11.3*  HCT 32.8* 34.7* 34.9* 33.6* 35.0*  MCV 83.9 83.6 83.5 84.8 86.2  PLT 232 305 359 422* 498*   BNP (last 3 results)  Recent Labs  12/13/13 1114  PROBNP 729.4*   CBG:  Recent Labs Lab 12/31/13 2002 01/01/14 0009 01/01/14 0306 01/01/14 0723 01/01/14 1203  GLUCAP 111* 114*  101* 127* 93     SIGNED: Time coordinating discharge: Over 30 minutes  Debbora Presto, MD  [facility] 01/01/2014, 2:06 PM Pager [phone]  If 7PM-7AM, please contact night-coverage [email or web address] Password [email or web address]

## 2014-01-01 NOTE — Progress Notes (Signed)
CSW diligently seeking placement with the assistance of Clinical Social Work Chiropodist, [employer].   Pt will be placed as difficult to place, but awaiting feedback on bed availability at difficult to place facilities.   CSW left messages with pt parents and awaiting return phone call in order to update pt family.  MD updated.  CSW to continue to follow.  Loletta Specter, MSW, LCSW Clinical Social Work [phone]

## 2014-01-01 NOTE — Progress Notes (Signed)
Physical Therapy Treatment Patient Details Name: Hector [name] MRN: [id number] DOB: 1970/11/11 Today's Date: 01/01/2014    History of Present Illness 43 yo male presented to ER8/18/15 for involuntary commitment as patient had become unmanageable at home by parents.  While awaiting placement patient found to be in respiratory distress,transferred to ICU,  requiring ventilator. Trac on 9/1, tolerated trach collar.  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  Patient was attending [facility] for PT, OT <SP.    PT Comments    Pt does follow simple commands but  delayed, noted volitional  Extension of R leg in supine,  R knee extension in sitting.Attempted standing but coughing spell limited.  Follow Up Recommendations  SNF;Supervision/Assistance - 24 hour     Equipment Recommendations  None recommended by PT    Recommendations for Other Services       Precautions / Restrictions Precautions Precautions: Fall Precaution Comments: trach with secretions, R hemi UE, paresis RLE    Mobility  Bed Mobility Overal bed mobility: Needs Assistance;+2 for physical assistance;+ 2 for safety/equipment Bed Mobility: Supine to Sit;Sit to Supine Rolling: Max assist   Supine to sit: +2 for physical assistance;Max assist Sit to supine: +2 for physical assistance;Max assist   General bed mobility comments: verbal and tactile cues to initiate  Transfers Overall transfer level: Needs assistance Equipment used: Rolling walker (2 wheeled) Transfers: Sit to/from Stand Sit to Stand: +2 physical assistance;Max assist         General transfer comment: assist to rise and balance support, decreased weight on R leg. Pt began coughing so returned to sitting  Ambulation/Gait                 Stairs            Wheelchair Mobility    Modified Rankin (Stroke Patients Only)       Balance   Sitting-balance support: Feet supported;Single extremity supported Sitting balance-Leahy  Scale: Poor Sitting balance - Comments: RUE placed on bed--minimal weight bearing Postural control: Right lateral lean                          Cognition Arousal/Alertness: Awake/alert Behavior During Therapy: WFL for tasks assessed/performed Overall Cognitive Status: No family/caregiver present to determine baseline cognitive functioning                      Exercises      General Comments        Pertinent Vitals/Pain Faces Pain Scale: Hurts little more Pain Location: when moved R hand, not specific site    Home Living                      Prior Function            PT Goals (current goals can now be found in the care plan section) Progress towards PT goals: Progressing toward goals    Frequency  Min 2X/week    PT Plan Current plan remains appropriate    Co-evaluation PT/OT/SLP Co-Evaluation/Treatment: Yes Reason for Co-Treatment: Complexity of the patient's impairments (multi-system involvement);For patient/therapist safety PT goals addressed during session: Mobility/safety with mobility;Balance;Strengthening/ROM       End of Session   Activity Tolerance: Patient tolerated treatment well Patient left: in bed;with call bell/phone within reach;with bed alarm set     Time: 4098-1191 PT Time Calculation (min): 47 min  Charges:  $Neuromuscular  Re-education: 23-37 mins                    G Codes:      [name] 01/01/2014, 6:30 PM Blanchard Kelch PT [phone]

## 2014-01-01 NOTE — Discharge Instructions (Signed)

## 2014-01-02 ENCOUNTER — Inpatient Hospital Stay (HOSPITAL_COMMUNITY): Payer: Medicaid Other

## 2014-01-02 LAB — BASIC METABOLIC PANEL
Anion gap: 11 (ref 5–15)
BUN: 16 mg/dL (ref 6–23)
CALCIUM: 9.7 mg/dL (ref 8.4–10.5)
CO2: 28 mEq/L (ref 19–32)
CREATININE: 0.61 mg/dL (ref 0.50–1.35)
Chloride: 100 mEq/L (ref 96–112)
GFR calc Af Amer: >90 mL/min (ref >90)
GLUCOSE: 118 mg/dL — AB (ref 70–99)
Potassium: 4.4 mEq/L (ref 3.7–5.3)
Sodium: 139 mEq/L (ref 137–147)

## 2014-01-02 LAB — CBC
HEMATOCRIT: 33.4 % — AB (ref 39.0–52.0)
HEMOGLOBIN: 10.7 g/dL — AB (ref 13.0–17.0)
MCH: 27.6 pg (ref 26.0–34.0)
MCHC: 32 g/dL (ref 30.0–36.0)
MCV: 86.1 fL (ref 78.0–100.0)
Platelets: 424 10*3/uL — ABNORMAL HIGH (ref 150–400)
RBC: 3.88 MIL/uL — ABNORMAL LOW (ref 4.22–5.81)
RDW: 16.9 % — ABNORMAL HIGH (ref 11.5–15.5)
WBC: 15.5 10*3/uL — ABNORMAL HIGH (ref 4.0–10.5)

## 2014-01-02 LAB — URINALYSIS, ROUTINE W REFLEX MICROSCOPIC
BILIRUBIN URINE: NEGATIVE
Glucose, UA: NEGATIVE mg/dL
Hgb urine dipstick: NEGATIVE
Ketones, ur: NEGATIVE mg/dL
Leukocytes, UA: NEGATIVE
Nitrite: NEGATIVE
Protein, ur: NEGATIVE mg/dL
Specific Gravity, Urine: 1.017 (ref 1.005–1.030)
UROBILINOGEN UA: 1 mg/dL (ref 0.0–1.0)
pH: 7.5 (ref 5.0–8.0)

## 2014-01-02 LAB — GLUCOSE, CAPILLARY
Glucose-Capillary: 106 mg/dL — ABNORMAL HIGH (ref 70–99)
Glucose-Capillary: 117 mg/dL — ABNORMAL HIGH (ref 70–99)
Glucose-Capillary: 122 mg/dL — ABNORMAL HIGH (ref 70–99)
Glucose-Capillary: 128 mg/dL — ABNORMAL HIGH (ref 70–99)
Glucose-Capillary: 106 mg/dL — ABNORMAL HIGH (ref 70–99)
Glucose-Capillary: 96 mg/dL (ref 70–99)

## 2014-01-02 NOTE — Progress Notes (Signed)
[facility] PROGRESS NOTE  Hector [name] ZOX:[id number] DOB: [birth year] DOA: 12/11/2013 PCP: Mariea Clonts, NP  Brief Summary 43 yo male presented to ER for involuntary commitment. 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 He was noted to having jerking movement / possible seizures. PCCM asked to assess with concern for airway protection/aspiration and was found to have HCAP.  Required tracheostomy on 9/1.  After tracheostomy and initiation of seroquel his delirium/agitation improved.    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 Parkinson symptoms?  - Neurology following, no further imaging recommended.   Leukocytosis, persistent and likely secondary to recent pneumonia.  No diarrhea.   -  Repeat UA and CXR:  UA negative and CXR demonstrates improvement.   -   Afebrile -  Repeat in 1 week  Diet:  NPO with tube feeds Access:  PIV/PEG IVF:  off Proph:  xarelto  Code Status: full Family Communication: patient alone Disposition Plan: to SNF tomorrow   Consultants:  PCCM  STUDIES:  8/17 Renal US >> normal renal US  8/17 CTA Chest >> no acute disease, mild centrilobular emphysema  8/19 CT ABD/Pelvis >> moderate amt stool in large bowel, G-tube, no acute process, bilateral non-obs up to 3mm stones  8/27 CT  SIGNIFICANT EVENTS:  8/17 Seen in ER with Rt flank pain, urine retention  8/18 Seen in ER for agitation, involuntary commitment papers placed  8/20 Admitted to SDU after shaking / agitation, concerns for seizures & ? R airspace disease  8/21 Improved agitation, brady with precedex  8/26 Started Abx for HCAP, precedex d/c'ed  8/27 R arm weakness ?new >> neuro reconsulted  8/28 VDRF  8/30 Extubated  8/31 agitated and rhonchus  8/31 intubated for acute resp distress  9/1 tracheostomy  9/3 24 hrs on trach collar  9/4 48 hrs on trach collar - vent dc'ed  9/5 care transferred to [facility] and pt transferred to telemetry bed    Antibiotics: - [name] 8/26>>9/2  - Vancomycin 8/27>>9/2   HPI/Subjective:  Denies pain.  Sleepy and does not answer other questions  Objective: Filed Vitals:   01/02/14 0315 01/02/14 0600 01/02/14 0924 01/02/14 1148  BP:  106/65    Pulse: 78 83 73 75  Temp:  98.1  F (36.7 C)    TempSrc:  Oral    Resp: Height:      Weight:      SpO2: 97% 97% 94% 97%    Intake/Output Summary (Last 24 hours) at 01/02/14 1251 Last data filed at 01/02/14 1000  Gross per 24 hour  Intake   1100 ml  Output   1576 ml  Net   -476 ml   Filed Weights   12/29/13 0400 12/30/13 0030 12/31/13 0500  Weight: 47.8 kg (105 lb 6.1 oz) 49.1 kg (108 lb 3.9 oz) 48.7 kg (107 lb 5.8 oz)    Exam:   General:  Thin WM, No acute distress  HEENT:  NCAT, MMM, tracheostomy in place, sutures removed  Cardiovascular:  RRR,  nl S1, S2 no mrg, 2+ pulses, warm extremities  Respiratory:  CTAB anteriorly, no increased WOB  Abdomen:   NABS, soft, NT/ND, PEG tube in place  MSK:   Decreased tone and bulk, 1+ bilateral LEE, prevalon boots in place  Neuro:  Expressive aphasia, sleepy, uses left hand some  Data Reviewed: Basic Metabolic Panel:  Recent Labs Lab 12/27/13 0347 12/28/13 0340 12/30/13 0417 12/31/13 0340 01/02/14 0020  NA 138 134* 137 140 139  K 4.8 4.4 4.1 4.2 4.4  CL 97 95* 98 98 100  CO2 GLUCOSE 140* 131* 129* 123* 118*  BUN CREATININE 0.52 0.57 0.55 0.66 0.61  CALCIUM 9.7 10.0 9.5 9.8 9.7  MG 2.0  --   --   --   --   PHOS 4.8*  --   --   --   --    Liver Function Tests: No results found for this basename: AST, ALT, ALKPHOS, BILITOT, PROT, ALBUMIN,  in the last 168 hours No results found for this basename: LIPASE, AMYLASE,  in the last 168 hours No results found for this basename: AMMONIA,  in the last 168 hours CBC:  Recent Labs Lab 12/27/13 0347 12/28/13 0340 12/30/13 0417 12/31/13 0340 01/02/14 0020  WBC 15.5* 15.3* 16.4* 12.7* 15.5*  HGB 11.3* 11.3* 11.1* 11.3* 10.7*  HCT 34.7* 34.9* 33.6* 35.0* 33.4*  MCV 83.6 83.5 84.8 86.2 86.1  PLT 305 359 422* 498* 424*   Cardiac Enzymes: No results found for this basename: CKTOTAL, CKMB, CKMBINDEX, TROPONINI,  in the last 168 hours BNP (last 3 results)  Recent Labs  12/13/13 1114  PROBNP 729.4*   CBG:  Recent Labs Lab 01/01/14 1929 01/02/14 0101 01/02/14 0408 01/02/14 0736 01/02/14 1149  GLUCAP 123* 106* 117* 122* 128*    No results found for this or any previous visit (from the past 240 hour(s)).   Studies: Dg Chest Port 1 View  01/02/2014   CLINICAL DATA:  Healthcare associated pneumonia. Traumatic brain injury.  EXAM: PORTABLE CHEST - 1 VIEW  COMPARISON:  12/27/2013  FINDINGS: Tracheostomy tube remains in place. Bibasilar airspace opacity shows mild improvement since previous study.  No new or worsening areas of pulmonary opacity are seen. No evidence of pleural effusion or pneumothorax. Heart size is within normal limits.  IMPRESSION: Mild improvement in bibasilar airspace opacity since prior exam.   Electronically Signed   By: Myles Rosenthal M.D.   On: 01/02/2014 08:35    Scheduled Meds: . antiseptic oral rinse  7 mL Mouth Rinse QID  . aspirin  81 mg Per Tube Daily  . chlorhexidine  15 mL Mouth Rinse BID  .  clonazePAM  1 mg Per Tube QHS  . docusate  100 mg Per Tube BID  . free water  200 mL Per Tube Q6H  . insulin aspart  0-9 Units Subcutaneous 6 times per day  . metoprolol tartrate  12.5 mg Per Tube Q12H  . pantoprazole sodium  40 mg Per Tube Q24H  . QUEtiapine  100 mg Oral TID  . rivaroxaban  20 mg Oral Q supper  . sennosides  5 mL Per Tube BID  . sodium chloride  3 mL Intravenous Q12H  . Valproic Acid  500 mg Per Tube Q12H   Continuous Infusions: . sodium chloride 10 mL/hr at 12/31/13 1615  . feeding supplement (JEVITY 1.5 CAL/FIBER) 1,000 mL (01/02/14 0100)    Active Problems:   TBI (traumatic brain injury)   Delirium due to general medical condition   HCAP (healthcare-associated pneumonia)   Sepsis   Acute respiratory failure with hypoxia    Time spent: 30 min    Mykaylah Ballman, [facility]  [facility] Pager [phone]. If 7PM-7AM, please contact night-coverage at [email or web address], password [facility] 01/02/2014, 12:51 PM  LOS: 22 days

## 2014-01-02 NOTE — Progress Notes (Signed)
Speech Language Pathology Treatment: Hector [name] Speaking valve  Patient Details Name: Hector [name] MRN: [id number] DOB: 1970-07-31 Today's Date: 01/02/2014 Time: 1530-1600 SLP Time Calculation (min): 30 min  Assessment / Plan / Recommendation Clinical Impression  Pt was seen at bedside to attempt PMSV placement. Cuff deflated, and pt educated re: plan.  PMSV was placed for only seconds, with immediate strong cough response elicited. Attempt was made 2 more times with rest breaks in between, and each time resulted in significant cough. Thick secretions suctioned, the last of which being slightly blood-tinged.  RN notified. Recommend speech therapy at [facility] for continued work toward PMSV use.  Respiratory in after this session to change trach to #6 cuffless, which may facilitate tolerance of the speaking valve.   HPI HPI: 43 y.o. with past medical history of TBI (traumatic brain injury 07/2013) and Seizure admitted with increased aggression toward family and grabbing at hisright flank/side.  Chest CT revealed groundglass opacities, abdominal CT revealed nonobstructive nephrolithiasis.  PT recommended ST assessment for cognition.  Pt. has trach and SLP requested eval for speaking valve. ST has been following for tolerance of PMSV   Pertinent Vitals Pain Assessment: Faces Faces Pain Scale: Hurts little more Pain Location: nonspecific  SLP Plan  Continue with current plan of care    Recommendations  Continued speech therapy at [facility]      Patient may use Passy-Muir Speech Valve: with SLP only PMSV Supervision: Full MD: Please consider changing trach tube to : Smaller size;Cuffless       Oral Care Recommendations: Oral care Q4 per protocol Follow up Recommendations: 24 hour supervision/assistance Plan: Continue with current plan of care    GO    Celia B. Murvin [facility], CCC-SLP [phone] [place] Aurora 01/02/2014, 4:05 PM

## 2014-01-02 NOTE — Progress Notes (Addendum)
CSW diligently seeking placement with the assistance of Clinical Social Work Surveyor, quantity, [employer].    CSW spoke with [facility] and Rehab this morning and facility requested to speak with RN. CSW arranged for facility to speak with RN. [place] states that they plan to have RN come assess pt to make decision regarding if they can make a difficult to place bed offer.   CSW to await feedback from [employer] once facility liaison comes to hospital to evaluate.   CSW to continue to follow.  Addendum 1:17 pm:   CSW met with [facility] and Rehab this morning in order for facility to complete assessment of pt.   CSW received notification from [facility] and Rehab that facility can accept pt as difficult to place and bed available tomorrow. [place] requesting a full trach set be sent with pt to the facility in the event that pt needs trach changed before facility is able to get the needed equipment. CSW will consult with respiratory therapy in regard to getting full trach set. CSW notified MD. CSW notified pt father, Wynetta Emery via telephone. Pt father very pleased and states that [place] is close to pt family home. Pt father aware of plan to transition pt to [employer].   CSW to continue to follow and facilitate pt discharge needs to [facility] and Rehab tomorrow.    Addendum 2:19 pm:  CSW spoke with RN and Respiratory Therapist, Judeen Hammans and full trach set 6 mm cuffless order to send with pt to [employer] [employer].  CSW spoke with [facility] regarding pt being changed to 6 mm cuffless and facility request that pt trach be changed today in order for pt to monitored overnight. CSW discussed with Respiratory Therapist, Judeen Hammans who states that pt trach can be changed today. [place] and Rehab requesting for pt discharge to be facilitated early tomorrow in order for pt to get settled at the facility.   CSW to facilitate  pt discharge needs in the morning.   Alison Murray, MSW, [place] Work [phone]

## 2014-01-02 NOTE — Progress Notes (Signed)
Patient suctioned for a a large amount of thick clear blood tinged sputum. Changed #8 cuffed Shiley to a #6 cuffless Shiley without difficulty. O2 sat 95% HR 85. RN to clean around trach and new dressing applied. Co2 detector confirmed good placement. Complete trach set up changed and patient remains on 28%. No complications. Will continue to monitor.

## 2014-01-03 ENCOUNTER — Encounter: Payer: Medicaid Other | Admitting: Speech Pathology

## 2014-01-03 LAB — CBC
HEMATOCRIT: 33.7 % — AB (ref 39.0–52.0)
HEMOGLOBIN: 10.7 g/dL — AB (ref 13.0–17.0)
MCH: 27.4 pg (ref 26.0–34.0)
MCHC: 31.8 g/dL (ref 30.0–36.0)
MCV: 86.4 fL (ref 78.0–100.0)
Platelets: 340 10*3/uL (ref 150–400)
RBC: 3.9 MIL/uL — AB (ref 4.22–5.81)
RDW: 17.1 % — ABNORMAL HIGH (ref 11.5–15.5)
WBC: 15 10*3/uL — ABNORMAL HIGH (ref 4.0–10.5)

## 2014-01-03 LAB — GLUCOSE, CAPILLARY
GLUCOSE-CAPILLARY: 104 mg/dL — AB (ref 70–99)
Glucose-Capillary: 114 mg/dL — ABNORMAL HIGH (ref 70–99)

## 2014-01-03 MED ORDER — OXYCODONE-ACETAMINOPHEN 5-325 MG PO TABS: 1.0000 | ORAL_TABLET | Freq: Four times a day (QID) | ORAL | Status: DC | PRN

## 2014-01-03 MED ORDER — CLONAZEPAM 1 MG PO TABS
1.0000 mg | ORAL_TABLET | Freq: Every day | ORAL | Status: DC
Start: 2014-01-03 — End: 2014-01-28

## 2014-01-03 MED ORDER — QUETIAPINE FUMARATE 100 MG PO TABS: 100.0000 mg | ORAL_TABLET | Freq: Three times a day (TID) | ORAL | Status: DC

## 2014-01-03 NOTE — Progress Notes (Signed)
ANTICOAGULATION CONSULT NOTE   Pharmacy Consult for Xarelto Indication: Hx of DVT/PE  Allergies  Allergen Reactions  . Ativan [Lorazepam]     Pt has paradoxical effect with sedating medications.    Patient Measurements: Height:  (170.2 cm) Weight: 107 lb 5.8 oz (48.7 kg) IBW/kg (Calculated) : 66.1  Vital Signs: Temp: 97.7 F (36.5 C) (09/10 0600) Temp src: Oral (09/10 0600) BP: 107/69 mmHg (09/10 0934) Pulse Rate: 85 (09/10 0934)  Labs:  Recent Labs  01/02/14 0020 01/03/14 0513  HGB 10.7* 10.7*  HCT 33.4* 33.7*  PLT 424* 340  CREATININE 0.61  --    Estimated Creatinine Clearance: 80.3 ml/min (by C-G formula based on Cr of 0.61).  Medications:  Scheduled:  . antiseptic oral rinse  7 mL Mouth Rinse QID  . aspirin  81 mg Per Tube Daily  . chlorhexidine  15 mL Mouth Rinse BID  . clonazePAM  1 mg Per Tube QHS  . docusate  100 mg Per Tube BID  . free water  200 mL Per Tube Q6H  . insulin aspart  0-9 Units Subcutaneous 6 times per day  . metoprolol tartrate  12.5 mg Per Tube Q12H  . pantoprazole sodium  40 mg Per Tube Q24H  . QUEtiapine  100 mg Oral TID  . rivaroxaban  20 mg Oral Q supper  . sennosides  5 mL Per Tube BID  . sodium chloride  3 mL Intravenous Q12H  . Valproic Acid  500 mg Per Tube Q12H   Assessment: 43 y/o M on Xarelto  daily (last dose 8/18) PTA for hx DVT/PE, converted to Lovenox while inpatient.  Lovenox was held for tracheostomy, which was performed 12/25/13.  Orders received to resume full-dose Lovenox with pharmacy dosing assistance on 9/2 then changed back to xarelto 9/6   CBC: Hgb stable, pltc WNL  Renal: SCr WNL   Plan:   Continue Xarelto  daily with supper  Monitor CBC, renal function  Pharmacy will follow at distance   Juliette Alcide, PharmD, BCPS.   Pager: [phone] 01/03/2014, 10:14 AM

## 2014-01-03 NOTE — Progress Notes (Signed)
Pt for discharge to [facility] and Rehab.  CSW facilitated pt discharge needs including contacting facility, faxing pt discharge information via [facility], discussing with pt mother, Harriett Sine via telephone, notifying pt at bedside, however pt sleeping at this time and difficult to assess pt understanding of transition to SNF, providing RN phone number to call report, and arranging ambulance transport for pt to [facility] and Rehab.   Pt mother appreciative of CSW support and assistance. CSW provided support as pt mother shared that she is anxious about pt transition to SNF as she wants to ensure that pt is well taken care of. Pt grateful that pt will be close to pt parents home which makes it easier for pt mother and pt father to visit.   No further social work needs identified at this time.  CSW signing off.   Loletta Specter, MSW, LCSW Clinical Social Work [phone]

## 2014-01-03 NOTE — Progress Notes (Signed)
Upon initial shift assessment, no distress noted with HOB elevated to to 30 degrees. Airway clear. #6.0 trach intact and patent with tach collar in place. O2 at 5L and sats 96%. No s/o aspiration. TF with Jevity via peg at goal rate of 50 ml/hr. No residual noted this am.

## 2014-01-03 NOTE — Progress Notes (Signed)
Pt discharged via stretcher accompanied by PTAR attendants to meet awaiting ambulance to transport pt to [facility]. Facility called and notified pt en route.

## 2014-01-03 NOTE — Progress Notes (Signed)
Assessment unchanged. Airway clear. #6.0 Shiley trach intact and patent. Suctioned x 1 prior to transfer. Congested cough with ability to expectorate small amounts of secretions. Lungs clear after cough with no distress noted. PTAR attendants x 2 at bedside preparing pt and securing tank for oxygen via trach collar for discharge and transfer to [facility]. Foley emptied. Peg tube clamp for transport.

## 2014-01-03 NOTE — Plan of Care (Signed)
Problem: Phase III Progression Outcomes Goal: Foley discontinued Outcome: Adequate for Discharge Pt has chronic foley Md wishes to leave in place with orders not to remove.

## 2014-01-03 NOTE — Discharge Summary (Signed)
Physician Discharge Summary  Hector [name] ZDG:[id number] DOB: [birth year] DOA: 12/11/2013  PCP: Mariea Clonts, NP  Admit date: 12/11/2013 Discharge date: 01/03/2014  Recommendations for Outpatient Follow-up:  1. Pt will need to follow up with PCP in 2-3 weeks post discharge 2. Please obtain BMP to evaluate electrolytes and kidney function 3. Please also check CBC to evaluate Hg and Hct levels 4. Please continue PT/OT/speech therapy  Discharge Diagnoses: Acute hypoxic respiratory failure secondary to HCAP  Active Problems:   TBI (traumatic brain injury)   Delirium due to general medical condition   HCAP (healthcare-associated pneumonia)   Sepsis   Acute respiratory failure with hypoxia  Discharge Condition: Stable  Diet recommendation: Heart healthy diet discussed in details   Brief narrative:  43 yo male presented to ER for involuntary commitment. 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 He was noted to having jerking movement / possible seizures. PCCM asked to assess with concern for airway protection/aspiration.   STUDIES:  8/17 Renal US >> normal renal US  8/17 CTA Chest >> no acute disease, mild centrilobular emphysema  8/19 CT ABD/Pelvis >> moderate amt stool in large bowel, G-tube, no acute process, bilateral non-obs up to 3mm stones  8/27 CT  SIGNIFICANT EVENTS:  8/17 Seen in ER with Rt flank pain, urine retention  8/18 Seen in ER for agitation, involuntary commitment papers placed  8/20 Admitted to SDU after shaking / agitation, concerns for seizures & ? R airspace disease  8/21 Improved agitation, brady with precedex  8/26 Started Abx for HCAP, precedex d/c'ed  8/27 R arm weakness ?new >> neuro reconsulted  8/28 VDRF  8/30 Extubated  8/31 agitated and rhonchus  8/31 intubated for acute resp distress  9/1 tracheostomy  9/3 24 hrs on trach collar  9/4 48 hrs on trach collar - vent dc'ed  9/5 care transferred to [facility] and pt transferred to telemetry bed   9/9 sutures removed and trach exchanged for #6 uncuffed  Assessment and Plan:    Acute hypoxemixc respiratory failure from HCAP.  CT angio negative for PE.  He was intubated initially by PCCM and underwent attempt at extubation on 8/30, however, he required reintubation on 8/31 due to agitation, secretions, and respiratory distress.  On 9/1, he underwent tracheostomy placement and was eventually taken off the ventilator on 9/4.  He has been tolerating trach collar well since then.  His sutures have been removed and he has had his first tracheostomy exchange on 9/9 and is currently using a #6 uncuffed trach.    Sinus tachycardia due to severe illness.  He has been started on metoprolol and his HR has been stable in the 70s-80s.    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    Protein calorie malnutrition, severe in the setting of TBI.   - PEG in place  - resumed feedings with no problems   Anemia of chronic disease  - no signs of active bleeding   Presumed HCAP, developed 8/26.   [name] 8/26>>9/2  - Vancomycin 8/27>>9/2  - blood cx 8/26>>neg  - 8/27 sputum>>nl flora   Severe agitated delirium with underlying traumatic brain injury after seizure / fall.  He has residual expressive aphasia and RUE weakness.  CT head was completed twice and demonstrated only residual changes from previous TBI but no acute changes.   - Continue PT/OT/SLP  Hx of ETOH preadmission Amantadine, Bromocriptine, Remeron held initially.  He was seen by neurology   - Neurology following, no further imaging recommended.   Leukocytosis, persistent and likely secondary to recent pneumonia. No diarrhea.  - Repeat UA and CXR: UA negative and CXR demonstrates improvement.  - Afebrile  - Repeat in 1 week  Code Status: Full    Discharge Exam: Filed Vitals:   01/03/14 0934  BP: 107/69  Pulse: 85  Temp:   Resp:    Filed Vitals:   01/03/14 0200 01/03/14 0326  01/03/14 0600 01/03/14 0934  BP: 97/60  96/66 107/69  Pulse: 84 82 79 85  Temp: 98 F (36.7 C)  97.7 F (36.5 C)   TempSrc: Oral  Oral   Resp: Height:      Weight:      SpO2: 94% 94% 98%     General:  Pt is awake, alert, follows some simple commands but not able to verbalize answers to questions today.  Mutters inaudibly Cardiovascular: Regular rate and rhythm, no rubs, no gallops Respiratory:  CTAB after recent suctioning at opening of trach (not deep suctioning) Abdominal:  NABS, soft, ND, NT Extremities:  1+ bilateral lower extremity pitting edema, no cyanosis, pulses palpable bilaterally DP and PT  Discharge Instructions     Discharge Instructions   Call MD for:  difficulty breathing, headache or visual disturbances    Complete by:  As directed      Call MD for:  extreme fatigue    Complete by:  As directed      Call MD for:  hives    Complete by:  As directed      Call MD for:  persistant dizziness or light-headedness    Complete by:  As directed      Call MD for:  persistant nausea and vomiting    Complete by:  As directed      Call MD for:  severe uncontrolled pain    Complete by:  As directed      Call MD for:  temperature >100.4    Complete by:  As directed      Increase activity slowly    Complete by:  As directed             Medication List    STOP taking these medications       risperiDONE 1 MG/ML oral solution  Commonly known as:  RISPERDAL      TAKE these medications       amantadine 50 MG/5ML solution  Commonly known as:  SYMMETREL  Take 100 mg by mouth 2 (two) times daily.     bromocriptine 2.5 MG tablet  Commonly known as:  PARLODEL  Take 5 mg by mouth 2 (two) times daily.     clonazePAM 1 MG tablet  Commonly known as:  KLONOPIN  Place 1 tablet (1 mg total) into feeding tube at bedtime.     docusate 50 MG/5ML liquid  Commonly known as:  COLACE  Place 10 mLs (100 mg total) into feeding tube 2 (two) times daily.      metoprolol tartrate 25 mg/10 mL Susp  Commonly known as:  LOPRESSOR  Place 5 mLs (12.5 mg total) into feeding tube every 12 (twelve) hours.     mirtazapine 15 MG tablet  Commonly known as:  REMERON  Take 15 mg by mouth at bedtime.     ondansetron 4 MG tablet  Commonly known as:  ZOFRAN  Take 1 tablet (4 mg total) by  mouth every 6 (six) hours.     oxyCODONE-acetaminophen 5-325 MG per tablet  Commonly known as:  PERCOCET/ROXICET  Take 1-2 tablets by mouth every 6 (six) hours as needed for moderate pain or severe pain.     pantoprazole sodium 40 mg/20 mL Pack  Commonly known as:  PROTONIX  Place 20 mLs (40 mg total) into feeding tube daily.     polyethylene glycol packet  Commonly known as:  MIRALAX / GLYCOLAX  Take 17 g by mouth daily as needed for mild constipation.     QUEtiapine 100 MG tablet  Commonly known as:  SEROQUEL  Take 1 tablet (100 mg total) by mouth 3 (three) times daily.     rivaroxaban 20 MG Tabs tablet  Commonly known as:  XARELTO  Take 20 mg by mouth daily.     Valproic Acid 250 MG/5ML Syrp syrup  Commonly known as:  DEPAKENE  Place 10 mLs (500 mg total) into feeding tube every 12 (twelve) hours.       Follow-up Information   Follow up with KRAFT, HOWARD A. On 03/04/2014. (neurology appointment on 03/04/14 with Dr Caswell Corwin )    Specialty:  Cardiology   Contact information:   [address] [place] [address] [place] [address] [phone]       Schedule an appointment as soon as possible for a visit with FOSTER, CHRISTOPHER B, NP.   Specialty:  Nurse Practitioner   Contact information:   [address] [place] [address] [phone]       Follow up with Debbora Presto, MD. (As needed call my cell phone [phone])    Specialty:  Internal Medicine   Contact information:   [address] [place] [address] [phone]        The results of significant diagnostics from this  hospitalization (including imaging, microbiology, ancillary and laboratory) are listed below for reference.     Microbiology: No results found for this or any previous visit (from the past 240 hour(s)).   Labs: Basic Metabolic Panel:  Recent Labs Lab 12/28/13 0340 12/30/13 0417 12/31/13 0340 01/02/14 0020  NA 134* 137 140 139  K 4.4 4.1 4.2 4.4  CL 95* 98 98 100  CO2 GLUCOSE 131* 129* 123* 118*  BUN CREATININE 0.57 0.55 0.66 0.61  CALCIUM 10.0 9.5 9.8 9.7   CBC:  Recent Labs Lab 12/28/13 0340 12/30/13 0417 12/31/13 0340 01/02/14 0020 01/03/14 0513  WBC 15.3* 16.4* 12.7* 15.5* 15.0*  HGB 11.3* 11.1* 11.3* 10.7* 10.7*  HCT 34.9* 33.6* 35.0* 33.4* 33.7*  MCV 83.5 84.8 86.2 86.1 86.4  PLT 359 422* 498* 424* 340   BNP (last 3 results)  Recent Labs  12/13/13 1114  PROBNP 729.4*   CBG:  Recent Labs Lab 01/02/14 1149 01/02/14 1604 01/02/14 2000 01/03/14 0026 01/03/14 0428  GLUCAP 128* 106* 96 104* 114*     SIGNED: Time coordinating discharge: Over 30 minutes  Sabrinia Prien, MD  [facility] 01/03/2014, 10:18 AM Pager [phone]  If 7PM-7AM, please contact night-coverage [email or web address] Password [email or web address]

## 2014-01-03 NOTE — Progress Notes (Signed)
Report called to [facility] and given to [facility] regarding pt status prior to transfer to facility.

## 2014-01-04 ENCOUNTER — Non-Acute Institutional Stay (SKILLED_NURSING_FACILITY): Payer: Medicaid Other | Admitting: Internal Medicine

## 2014-01-04 DIAGNOSIS — J9601 Acute respiratory failure with hypoxia: Secondary | ICD-10-CM

## 2014-01-04 DIAGNOSIS — Z5189 Encounter for other specified aftercare: Secondary | ICD-10-CM

## 2014-01-04 DIAGNOSIS — I824Y9 Acute embolism and thrombosis of unspecified deep veins of unspecified proximal lower extremity: Secondary | ICD-10-CM

## 2014-01-04 DIAGNOSIS — F05 Delirium due to known physiological condition: Secondary | ICD-10-CM

## 2014-01-04 DIAGNOSIS — F1010 Alcohol abuse, uncomplicated: Secondary | ICD-10-CM

## 2014-01-04 DIAGNOSIS — S069X7D Unspecified intracranial injury with loss of consciousness of any duration with death due to brain injury prior to regaining consciousness, subsequent encounter: Secondary | ICD-10-CM

## 2014-01-04 DIAGNOSIS — J189 Pneumonia, unspecified organism: Secondary | ICD-10-CM

## 2014-01-04 DIAGNOSIS — J9600 Acute respiratory failure, unspecified whether with hypoxia or hypercapnia: Secondary | ICD-10-CM

## 2014-01-04 DIAGNOSIS — I82419 Acute embolism and thrombosis of unspecified femoral vein: Secondary | ICD-10-CM

## 2014-01-04 DIAGNOSIS — E43 Unspecified severe protein-calorie malnutrition: Secondary | ICD-10-CM

## 2014-01-04 LAB — GLUCOSE, CAPILLARY: Glucose-Capillary: 130 mg/dL — ABNORMAL HIGH (ref 70–99)

## 2014-01-04 NOTE — Progress Notes (Signed)
MRN: [id number] Name: Hector [name]  Sex: male Age: 43 y.o. DOB: 1971/03/09  [facility] #: [facility] Facility/Room:415W Level Of Care: SNF Provider: Merrilee Seashore D Emergency Contacts: Extended Emergency Contact Information Primary Emergency Contact: Giraldo,Lester Address: [address]          [place], [place] [address] Darden Amber of [place] Home Phone: [phone] Work Phone: [phone] Mobile Phone: [phone] Relation: Father Secondary Emergency Contact: Bartram,Nancy Address: [address]          [place], [place] [address] Darden Amber of [place] Home Phone: [phone] Relation: Mother  Code Status: DNR  Allergies: Ativan  Chief Complaint  Patient presents with  . New Admit To SNF    HPI: Patient is 43 y.o. male who has h/o TBI admitted after acute resp failure for OT/PT.  Past Medical History  Diagnosis Date  . 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 Seizure   . DVT (deep venous thrombosis)     UE, LE DVT during hospitalization  . H/O renal calculi     Past Surgical History  Procedure Laterality Date  . Tracheostomy      feinstein      Medication List       This list is accurate as of: 01/04/14 11:59 PM.  Always use your most recent med list.               amantadine 50 MG/5ML solution  Commonly known as:  SYMMETREL  Take 100 mg by mouth 2 (two) times daily.     bromocriptine 2.5 MG tablet  Commonly known as:  PARLODEL  Take 5 mg by mouth 2 (two) times daily.     clonazePAM 1 MG tablet  Commonly known as:  KLONOPIN  Place 1 tablet (1 mg total) into feeding tube at bedtime.     docusate 50 MG/5ML liquid  Commonly known as:  COLACE  Place 10 mLs (100 mg total) into feeding tube 2 (two) times daily.     metoprolol tartrate 25 mg/10 mL Susp  Commonly known as:  LOPRESSOR  Place 5 mLs (12.5 mg total) into feeding tube every 12 (twelve) hours.     mirtazapine 15 MG tablet  Commonly known as:   REMERON  Take 15 mg by mouth at bedtime.     ondansetron 4 MG tablet  Commonly known as:  ZOFRAN  Take 1 tablet (4 mg total) by mouth every 6 (six) hours.     oxyCODONE-acetaminophen 5-325 MG per tablet  Commonly known as:  PERCOCET/ROXICET  Take 1-2 tablets by mouth every 6 (six) hours as needed for moderate pain or severe pain.     pantoprazole sodium 40 mg/20 mL Pack  Commonly known as:  PROTONIX  Place 20 mLs (40 mg total) into feeding tube daily.     polyethylene glycol packet  Commonly known as:  MIRALAX / GLYCOLAX  Take 17 g by mouth daily as needed for mild constipation.     QUEtiapine 100 MG tablet  Commonly known as:  SEROQUEL  Take 1 tablet (100 mg total) by mouth 3 (three) times daily.     rivaroxaban 20 MG Tabs tablet  Commonly known as:  XARELTO  Take 20 mg by mouth daily.     Valproic Acid 250 MG/5ML Syrp syrup  Commonly known as:  DEPAKENE  Place 10 mLs (500 mg total) into feeding tube every 12 (twelve) hours.  No orders of the defined types were placed in this encounter.     There is no immunization history on file for this patient.  History  Substance Use Topics  . Smoking status: Current Every Day Smoker -- 20 years  . Smokeless tobacco: Never Used  . Alcohol Use: No     Comment: Former ETOH abuse.  Quit Oct 2014.      Family history is noncontributory    Review of Systems   UTO 2/2 pt condition;nurse voices no concern except pt gets extremely agistaed at times.   Filed Vitals:   01/04/14 2039  BP: 104/74  Pulse: 86  Temp: 97.1 F (36.2 C)  Resp: 16    Physical Exam  GENERAL APPEARANCE: Alert Appropriately groomed, non verbal but mutters No acute distress.  SKIN: No diaphoresis rash HEAD: Normocephalic, atraumatic  EYES: Conjunctiva/lids clear. Pupils round, reactive. EOMs intact.  EARS: External exam WNL, canals clear. Hearing grossly normal.  NOSE: No deformity or discharge.  RESPIRATORY: Breathing is even, unlabored.  Lung sounds are clear ;trach  CARDIOVASCULAR: Heart RRR no murmurs, rubs or gallops. No peripheral edema.  GASTROINTESTINAL: Abdomen is soft, non-tender, not distended w/ normal bowel sounds; PEG GENITOURINARY: Bladder non tender, not distended  MUSCULOSKELETAL: No abnormal joints or musculature NEUROLOGIC: .  Moves all extremities no tremor. PSYCHIATRIC: , no behavioral issues  Patient Active Problem List   Diagnosis Date Noted  . Dvt femoral (deep venous thrombosis) 01/05/2014  . Protein-calorie malnutrition, severe 01/05/2014  . Alcohol abuse 01/05/2014  . Acute respiratory failure with hypoxia 12/20/2013  . Delirium due to general medical condition 12/13/2013  . HCAP (healthcare-associated pneumonia) 12/13/2013  . Sepsis 12/13/2013  . TBI (traumatic brain injury) 12/12/2013    CBC    Component Value Date/Time   WBC 15.0* 01/03/2014 0513   RBC 3.90* 01/03/2014 0513   HGB 10.7* 01/03/2014 0513   HCT 33.7* 01/03/2014 0513   PLT 340 01/03/2014 0513   MCV 86.4 01/03/2014 0513   LYMPHSABS 1.6 12/13/2013 0446   MONOABS 1.6* 12/13/2013 0446   EOSABS 0.1 12/13/2013 0446   BASOSABS 0.0 12/13/2013 0446    CMP     Component Value Date/Time   NA 139 01/02/2014 0020   K 4.4 01/02/2014 0020   CL 100 01/02/2014 0020   CO2 28 01/02/2014 0020   GLUCOSE 118* 01/02/2014 0020   BUN 16 01/02/2014 0020   CREATININE 0.61 01/02/2014 0020   CALCIUM 9.7 01/02/2014 0020   PROT 6.8 12/22/2013 0319   ALBUMIN 1.9* 12/22/2013 0319   AST 25 12/22/2013 0319   ALT 16 12/22/2013 0319   ALKPHOS 230* 12/22/2013 0319   BILITOT <0.2* 12/22/2013 0319   GFRNONAA >90 01/02/2014 0020   GFRAA >90 01/02/2014 0020    Assessment and Plan  Acute respiratory failure with hypoxia from HCAP. CT angio negative for PE. He was intubated initially by PCCM and underwent attempt at extubation on 8/30, however, he required reintubation on 8/31 due to agitation, secretions, and respiratory distress. On 9/1, he underwent tracheostomy placement and  was eventually taken off the ventilator on 9/4. He has been tolerating trach collar well since then. His sutures have been removed and he has had his first tracheostomy exchange on 9/9 and is currently using a #6 uncuffed trach.    HCAP (healthcare-associated pneumonia) , developed 8/26.  - [name] 8/26>>9/2  - Vancomycin 8/27>>9/2  - blood cx 8/26>>neg  - 8/27 sputum>>nl flora    Dvt femoral (deep venous  thrombosis) Continue xarelto  Protein-calorie malnutrition, severe PEG in place  - resumed feedings with no problems    Delirium due to general medical condition With severe agitation with underlying traumatic brain injury after seizure / fall. He has residual expressive aphasia and RUE weakness. CT head was completed twice and demonstrated only residual changes from previous TBI but no acute changes.  - Continue PT/OT/SLP   TBI (traumatic brain injury) As above from seizure with fall from standing;ETOH was involved  Alcohol abuse  Amantadine, Bromocriptine, Remeron . He was seen by neurology  - Neurology followed, no further imaging recommende     Margit Hanks, MD

## 2014-01-05 ENCOUNTER — Encounter: Payer: Self-pay | Admitting: Internal Medicine

## 2014-01-05 DIAGNOSIS — E43 Unspecified severe protein-calorie malnutrition: Secondary | ICD-10-CM | POA: Insufficient documentation

## 2014-01-05 DIAGNOSIS — F1010 Alcohol abuse, uncomplicated: Secondary | ICD-10-CM | POA: Insufficient documentation

## 2014-01-05 DIAGNOSIS — I82419 Acute embolism and thrombosis of unspecified femoral vein: Secondary | ICD-10-CM | POA: Insufficient documentation

## 2014-01-05 NOTE — Assessment & Plan Note (Signed)
Amantadine, Bromocriptine, Remeron . He was seen by neurology  - Neurology followed, no further imaging recommende

## 2014-01-05 NOTE — Assessment & Plan Note (Signed)
As above from seizure with fall from standing;ETOH was involved

## 2014-01-05 NOTE — Assessment & Plan Note (Signed)
Continue xarelto

## 2014-01-05 NOTE — Assessment & Plan Note (Addendum)
With severe agitation with underlying traumatic brain injury after seizure / fall. He has residual expressive aphasia and RUE weakness. CT head was completed twice and demonstrated only residual changes from previous TBI but no acute changes.  - Continue PT/OT/SLP

## 2014-01-05 NOTE — Assessment & Plan Note (Signed)
PEG in place  - resumed feedings with no problems

## 2014-01-05 NOTE — Assessment & Plan Note (Signed)
from HCAP. CT angio negative for PE. He was intubated initially by PCCM and underwent attempt at extubation on 8/30, however, he required reintubation on 8/31 due to agitation, secretions, and respiratory distress. On 9/1, he underwent tracheostomy placement and was eventually taken off the ventilator on 9/4. He has been tolerating trach collar well since then. His sutures have been removed and he has had his first tracheostomy exchange on 9/9 and is currently using a #6 uncuffed trach.

## 2014-01-05 NOTE — Assessment & Plan Note (Signed)
,   developed 8/26.  [name] 8/26>>9/2  - Vancomycin 8/27>>9/2  - blood cx 8/26>>neg  - 8/27 sputum>>nl flora

## 2014-01-10 ENCOUNTER — Encounter: Payer: Self-pay | Admitting: Internal Medicine

## 2014-01-10 ENCOUNTER — Non-Acute Institutional Stay (SKILLED_NURSING_FACILITY): Payer: Medicaid Other | Admitting: Internal Medicine

## 2014-01-10 DIAGNOSIS — J189 Pneumonia, unspecified organism: Secondary | ICD-10-CM

## 2014-01-10 DIAGNOSIS — J9600 Acute respiratory failure, unspecified whether with hypoxia or hypercapnia: Secondary | ICD-10-CM

## 2014-01-10 DIAGNOSIS — J9601 Acute respiratory failure with hypoxia: Secondary | ICD-10-CM

## 2014-01-10 NOTE — Progress Notes (Signed)
[name], male   DOB: Dec 29, 1970, 43 y.o.   MRN: [id number]   this is an acute visit.  Level of care skilled.  Facility AF.   Chief Complaint   Acute visit secondary to sputum production--history of pneumonia   .    HPI: Patient is 43 y.o. male who has h/o TBI admitted after acute resp failure for OT/PT.--he  has had a tracheostomy placed. He was treated aggressively in the hospital for pneumonia with IV antibiotics.  Apparently since his arrival here he has been fairly stable respiratory-wise-per nursing staff has noted some sputum from his tracheostomy site they describe this as thick greenish and yellow in color.  His vital signs are stable 2 patient is nonverbal but appears to be resting comfortably  O2saturation is in the 90s-   Past Medical History   Diagnosis  Date   .  TBI (traumatic brain injury)      07/2013, fall from standing post siezure   .  Seizure    .  DVT (deep venous thrombosis)      UE, LE DVT during hospitalization   .  H/O renal calculi     Past Surgical History   Procedure  Laterality  Date   .  Tracheostomy       feinstein      Medication List         .            amantadine 50 MG/5ML solution    Commonly known as: SYMMETREL    Take 100 mg by mouth 2 (two) times daily.    bromocriptine 2.5 MG tablet    Commonly known as: PARLODEL    Take 5 mg by mouth 2 (two) times daily.    clonazePAM 1 MG tablet    Commonly known as: KLONOPIN    Place 1 tablet (1 mg total) into feeding tube at bedtime.    docusate 50 MG/5ML liquid    Commonly known as: COLACE    Place 10 mLs (100 mg total) into feeding tube 2 (two) times daily.    metoprolol tartrate 25 mg/10 mL Susp    Commonly known as: LOPRESSOR    Place 5 mLs (12.5 mg total) into feeding tube every 12 (twelve) hours.    mirtazapine 15 MG tablet    Commonly known as: REMERON    Take 15 mg by mouth at bedtime.    ondansetron 4 MG tablet    Commonly known as: ZOFRAN    Take 1  tablet (4 mg total) by mouth every 6 (six) hours.    oxyCODONE-acetaminophen 5-325 MG per tablet    Commonly known as: PERCOCET/ROXICET    Take 1-2 tablets by mouth every 6 (six) hours as needed for moderate pain or severe pain.    pantoprazole sodium 40 mg/20 mL Pack    Commonly known as: PROTONIX    Place 20 mLs (40 mg total) into feeding tube daily.    polyethylene glycol packet    Commonly known as: MIRALAX / GLYCOLAX    Take 17 g by mouth daily as needed for mild constipation.    QUEtiapine 100 MG tablet    Commonly known as: SEROQUEL    Take 1 tablet (100 mg total) by mouth 3 (three) times daily.    rivaroxaban 20 MG Tabs tablet    Commonly known as: XARELTO    Take 20 mg by mouth daily.    Valproic Acid 250 MG/5ML Syrp  syrup    Commonly known as: DEPAKENE    Place 10 mLs (500 mg total) into feeding tube every 12 (twelve) hours.     No orders of the defined types were placed in this encounter.  There is no immunization history on file for this patient.  History   Substance Use Topics   .  Smoking status:  Current Every Day [name] -- 20 years   .  Smokeless tobacco:  Never Used   .  Alcohol Use:  No      Comment: Former ETOH abuse. Quit Oct 2014.   Family history is noncontributory  Review of Systems UTO 2/2 pt condition;nurse voices no concern except he has had some increased sputum production greenish yellow in color.                    Physical Exam   Temperature 97.0 pulse 74 respirations 22 blood pressure 108/66 O2 saturation is 97%  GENERAL APPEARANCE: Alert Appropriately groomed, non verbal but mutters No acute distress.  SKIN: No diaphoresis rash  HEAD: Normocephalic, atraumatic  EYES: Conjunctiva/lids clear. Pupils round, reactive. EOMs intact.  EARS: External exam WNL, canals clear. Hearing grossly norma NECK-tracheostomy site at this point I do not note any drainage but per nursing there has been some significant drainageas noted about l.  NOSE: No  deformity or discharge.  RESPIRATORY: Breathing is even, unlabored. Lung sounds are clear ;trach  CARDIOVASCULAR: Heart RRR no murmurs, rubs or gallops. No peripheral edema.  GASTROINTESTINAL: Abdomen is soft, non-tender, not distended w/ normal bowel sounds; PEG  appears unremarkable without any drainage or bleeding or surrounding erythema GENITOURINARY: Bladder non tender, not distended  MUSCULOSKELETAL: No abnormal joints or musculature  NEUROLOGIC: . Moves all extremities no tremor.  PSYCHIATRIC: , no behavioral issues  Patient Active Problem List    Diagnosis  Date Noted   .  Dvt femoral (deep venous thrombosis)  01/05/2014   .  Protein-calorie malnutrition, severe  01/05/2014   .  Alcohol abuse  01/05/2014   .  Acute respiratory failure with hypoxia  12/20/2013   .  Delirium due to general medical condition  12/13/2013   .  HCAP (healthcare-associated pneumonia)  12/13/2013   .  Sepsis  12/13/2013   .  TBI (traumatic brain injury)  12/12/2013   CBC    Component  Value  Date/Time    WBC  15.0*  01/03/2014 0513    RBC  3.90*  01/03/2014 0513    HGB  10.7*  01/03/2014 0513    HCT  33.7*  01/03/2014 0513    PLT  340  01/03/2014 0513    MCV  86.4  01/03/2014 0513    LYMPHSABS  1.6  12/13/2013 0446    MONOABS  1.6*  12/13/2013 0446    EOSABS  0.1  12/13/2013 0446    BASOSABS  0.0  12/13/2013 0446   CMP    Component  Value  Date/Time    NA  139  01/02/2014 0020    K  4.4  01/02/2014 0020    CL  100  01/02/2014 0020    CO2  28  01/02/2014 0020    GLUCOSE  118*  01/02/2014 0020    BUN  16  01/02/2014 0020    CREATININE  0.61  01/02/2014 0020    CALCIUM  9.7  01/02/2014 0020    PROT  6.8  12/22/2013 0319    ALBUMIN  1.9*  12/22/2013  0319    AST  25  12/22/2013 0319    ALT  16  12/22/2013 0319    ALKPHOS  230*  12/22/2013 0319    BILITOT  <0.2*  12/22/2013 0319    GFRNONAA  >90  01/02/2014 0020    GFRAA  >90  01/02/2014 0020   Assessment and Plan  Acute respiratory failure with hypoxia  from HCAP. CT  angio negative for PE. He was intubated initially by PCCM and underwent attempt at extubation on 8/30, however, he required reintubation on 8/31 due to agitation, secretions, and respiratory distress. On 9/1, he underwent tracheostomy placement and was eventually taken off the ventilator on 9/4. He has been tolerating trach collar well since then. His sutures have been removed and he has had his first tracheostomy exchange on 9/9  Clinically he appears stable however he has had some increased sputum production past will order a chest x-ray as well as CBC with differential and metabolic panel.  Also will culture for any drainage-will start him empirically on Levaquin for a 7 day course with his significant respiratory history previously.  HCAP (healthcare-associated pneumonia)  , developed 8/26.  [name] 8/26>>9/2  - Vancomycin 8/27>>9/2  - blood cx 8/26>>neg  - 8/27 sputum>>nl flora  Dvt femoral (deep venous thrombosis)  Continue xarelto  Protein-calorie malnutrition, severe  PEG in place  - resumed feedings with no problems  Delirium due to general medical condition  With severe agitation with underlying traumatic brain injury after seizure / fall. He has residual expressive aphasia and RUE weakness. CT head was completed twice and demonstrated only residual changes from previous TBI but no acute changes.  - Continue PT/OT/SLP  TBI (traumatic brain injury)  As above from seizure with fall from standing;ETOH was involved  Alcohol abuse  Amantadine, Bromocriptine, Remeron . He was seen by neurology  - Neurology followed, no further imaging recommende    [phone]

## 2014-01-22 ENCOUNTER — Other Ambulatory Visit: Payer: Self-pay | Admitting: *Deleted

## 2014-01-22 MED ORDER — OXYCODONE-ACETAMINOPHEN 5-325 MG PO TABS: 1.0000 | ORAL_TABLET | Freq: Four times a day (QID) | ORAL | Status: DC | PRN

## 2014-01-22 NOTE — Telephone Encounter (Signed)
[facility] 

## 2014-01-23 ENCOUNTER — Non-Acute Institutional Stay (SKILLED_NURSING_FACILITY): Payer: Medicaid Other | Admitting: Internal Medicine

## 2014-01-23 DIAGNOSIS — J438 Other emphysema: Secondary | ICD-10-CM

## 2014-01-23 DIAGNOSIS — Z7189 Other specified counseling: Secondary | ICD-10-CM

## 2014-01-23 DIAGNOSIS — Z930 Tracheostomy status: Secondary | ICD-10-CM

## 2014-01-23 DIAGNOSIS — J432 Centrilobular emphysema: Secondary | ICD-10-CM

## 2014-01-23 NOTE — Assessment & Plan Note (Addendum)
This meeting was precipitated by Mom's wish for pt to [facility] and the SNf's policy to not transport out of town. Pt's Mom has many concerns about son's TBI and exactly what that means, which was discussed in detail. She really didn't understand why he suddenly couldn't walk after his fall and why he cant walk well after last hospitalization.  [facility], getting neuro appt first. Pt is doing well with his rehab per PT, dressing and taking some steps. Mom now understands that as long as son makes gains in rehab he will be worked with, no matter the time frame. She also wants to know if his reach tube can be taken out. i told her we would tie that in to his ability to eat and swallow. The blue dye test has just come back and swallowing eval can progress.

## 2014-01-23 NOTE — Progress Notes (Signed)
MRN: [id number] Name: Hector [name]  Sex: male Age: 43 y.o. DOB: 08-12-1970  [facility] #: [email or web address] Facility/Room: 415W Level Of Care: SNF Provider: Merrilee SeashoreALEXANDER, Daevon Holdren D Emergency Contacts: Extended Emergency Contact Information Primary Emergency Contact: Belardo,Lester Address: [address]          [place], [place] [address] [place] Home Phone: [phone] Work Phone: [phone] Mobile Phone: [phone] Relation: Father Secondary Emergency Contact: Mcclenathan,Nancy Address: [address]          [place], [place] [address] [place] Home Phone: [phone] Relation: Mother  Code Status: DNR  Allergies: Ativan  Chief Complaint  Patient presents with  . family conference with patient present    HPI: Patient is 43 y.o. male whose Mom has requested a family conference.   Past Medical History  Diagnosis Date  . 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 Seizure   . DVT (deep venous thrombosis)     UE, LE DVT during hospitalization  . H/O renal calculi     Past Surgical History  Procedure Laterality Date  . Tracheostomy      feinstein      Medication List       This list is accurate as of: 01/23/14 11:59 PM.  Always use your most recent med list.               amantadine 50 MG/5ML solution  Commonly known as:  SYMMETREL  Take 100 mg by mouth 2 (two) times daily.     bromocriptine 2.5 MG tablet  Commonly known as:  PARLODEL  Take 5 mg by mouth 2 (two) times daily.     clonazePAM 1 MG tablet  Commonly known as:  KLONOPIN  Place 1 tablet (1 mg total) into feeding tube at bedtime.     docusate 50 MG/5ML liquid  Commonly known as:  COLACE  Place 10 mLs (100 mg total) into feeding tube 2 (two) times daily.     metoprolol tartrate 25 mg/10 mL Susp  Commonly known as:  LOPRESSOR  Place 5 mLs (12.5 mg total) into feeding tube every 12 (twelve) hours.     mirtazapine 15 MG tablet  Commonly known as:   REMERON  Take 15 mg by mouth at bedtime.     ondansetron 4 MG tablet  Commonly known as:  ZOFRAN  Take 1 tablet (4 mg total) by mouth every 6 (six) hours.     oxyCODONE-acetaminophen 5-325 MG per tablet  Commonly known as:  PERCOCET/ROXICET  Take 1-2 tablets by mouth every 6 (six) hours as needed for moderate pain or severe pain.     pantoprazole sodium 40 mg/20 mL Pack  Commonly known as:  PROTONIX  Place 20 mLs (40 mg total) into feeding tube daily.     polyethylene glycol packet  Commonly known as:  MIRALAX / GLYCOLAX  Take 17 g by mouth daily as needed for mild constipation.     QUEtiapine 100 MG tablet  Commonly known as:  SEROQUEL  Take 1 tablet (100 mg total) by mouth 3 (three) times daily.     rivaroxaban 20 MG Tabs tablet  Commonly known as:  XARELTO  Take 20 mg by mouth daily.     Valproic Acid 250 MG/5ML Syrp syrup  Commonly known as:  DEPAKENE  Place 10 mLs (500 mg total) into feeding tube every 12 (twelve) hours.        No  orders of the defined types were placed in this encounter.     There is no immunization history on file for this patient.  History  Substance Use Topics  . Smoking status: Current Every Day Smoker -- 20 years  . Smokeless tobacco: Never Used  . Alcohol Use: No     Comment: Former ETOH abuse.  Quit Oct 2014.      Review of Systems   UTO ; pt with aphasia   Filed Vitals:   01/23/14 1804  BP: 106/72  Pulse: 70  Temp: 96 F (35.6 C)  Resp: 20    Physical Exam  GENERAL APPEARANCE: Alert, nonconversant, No acute distress  SKIN: No diaphoresis rash, HEENT: Unremarkable except trach RESPIRATORY: Breathing is even, unlabored. Lung sounds are clear   CARDIOVASCULAR: Heart RRR no murmurs, rubs or gallops. No peripheral edema  GASTROINTESTINAL: Abdomen is soft, non-tender, not distended w/ normal bowel sounds.  GENITOURINARY: Bladder non tender, not distended  MUSCULOSKELETAL: No abnormal joints or musculature NEUROLOGIC:  Cranial nerves 2-12 grossly intact except aphasia PSYCHIATRIC:  no behavioral issues  Patient Active Problem List   Diagnosis Date Noted  . Centrilobular emphysema 01/27/2014  . Tracheostomy in place 01/27/2014  . Encounter for family conference with patient present 01/23/2014  . Dvt femoral (deep venous thrombosis) 01/05/2014  . Protein-calorie malnutrition, severe 01/05/2014  . Alcohol abuse 01/05/2014  . Acute respiratory failure with hypoxia 12/20/2013  . Delirium due to general medical condition 12/13/2013  . HCAP (healthcare-associated pneumonia) 12/13/2013  . Sepsis 12/13/2013  . TBI (traumatic brain injury) 12/12/2013    CBC    Component Value Date/Time   WBC 15.0* 01/03/2014 0513   RBC 3.90* 01/03/2014 0513   HGB 10.7* 01/03/2014 0513   HCT 33.7* 01/03/2014 0513   PLT 340 01/03/2014 0513   MCV 86.4 01/03/2014 0513   LYMPHSABS 1.6 12/13/2013 0446   MONOABS 1.6* 12/13/2013 0446   EOSABS 0.1 12/13/2013 0446   BASOSABS 0.0 12/13/2013 0446    CMP     Component Value Date/Time   NA 139 01/02/2014 0020   K 4.4 01/02/2014 0020   CL 100 01/02/2014 0020   CO2 28 01/02/2014 0020   GLUCOSE 118* 01/02/2014 0020   BUN 16 01/02/2014 0020   CREATININE 0.61 01/02/2014 0020   CALCIUM 9.7 01/02/2014 0020   PROT 6.8 12/22/2013 0319   ALBUMIN 1.9* 12/22/2013 0319   AST 25 12/22/2013 0319   ALT 16 12/22/2013 0319   ALKPHOS 230* 12/22/2013 0319   BILITOT <0.2* 12/22/2013 0319   GFRNONAA >90 01/02/2014 0020   GFRAA >90 01/02/2014 0020    Assessment and Plan  Encounter for family conference with patient present This meeting was precipitated by Mom's wish for pt to be f/u at [facility] and the SNf's policy to not transport out of town. Pt's Mom has many concerns about son's TBI and exactly what that means, which was discussed in detail. She really didn't understand why he suddenly couldn't walk after his fall and why he cant walk well after last hospitalization.  Mom is going to tx pt care to [facility], getting neuro  appt first. Pt is doing well with his rehab per PT, dressing and taking some steps. Mom now understands that as long as son makes gains in rehab he will be worked with, no matter the time frame. She also wants to know if his reach tube can be taken out. i told her we would tie that in to his ability  to eat and swallow. The blue dye test has just come back and swallowing eval can progress.   Time spent with PT, nursing and family > 35 min Margit Hanks, MD

## 2014-01-27 ENCOUNTER — Encounter: Payer: Self-pay | Admitting: Internal Medicine

## 2014-01-27 DIAGNOSIS — J432 Centrilobular emphysema: Secondary | ICD-10-CM | POA: Insufficient documentation

## 2014-01-27 DIAGNOSIS — Z930 Tracheostomy status: Secondary | ICD-10-CM | POA: Insufficient documentation

## 2014-01-28 ENCOUNTER — Other Ambulatory Visit: Payer: Self-pay | Admitting: *Deleted

## 2014-01-28 MED ORDER — CLONAZEPAM 1 MG PO TABS: 1.0000 mg | ORAL_TABLET | Freq: Every day | ORAL | Status: DC

## 2014-01-28 NOTE — Telephone Encounter (Signed)
[facility] 

## 2014-01-31 ENCOUNTER — Other Ambulatory Visit: Payer: Self-pay | Admitting: *Deleted

## 2014-01-31 MED ORDER — CLONAZEPAM 1 MG PO TABS: ORAL_TABLET | ORAL | Status: DC

## 2014-01-31 NOTE — Telephone Encounter (Signed)
[facility] 

## 2014-02-07 ENCOUNTER — Telehealth: Payer: Self-pay | Admitting: Neurology

## 2014-02-07 NOTE — Telephone Encounter (Signed)
rec'd from [facility] forward 167 pages [facility]

## 2014-03-20 ENCOUNTER — Ambulatory Visit (INDEPENDENT_AMBULATORY_CARE_PROVIDER_SITE_OTHER): Payer: Medicaid Other | Admitting: Neurology

## 2014-03-20 ENCOUNTER — Encounter: Payer: Self-pay | Admitting: Neurology

## 2014-03-20 VITALS — BP 82/58 | HR 64 | Ht 68.0 in | Wt 117.0 lb

## 2014-03-20 DIAGNOSIS — I6200 Nontraumatic subdural hemorrhage, unspecified: Secondary | ICD-10-CM

## 2014-03-20 DIAGNOSIS — G40909 Epilepsy, unspecified, not intractable, without status epilepticus: Secondary | ICD-10-CM

## 2014-03-20 DIAGNOSIS — F1021 Alcohol dependence, in remission: Secondary | ICD-10-CM

## 2014-03-20 DIAGNOSIS — S065XAA Traumatic subdural hemorrhage with loss of consciousness status unknown, initial encounter: Secondary | ICD-10-CM

## 2014-03-20 DIAGNOSIS — Z9889 Other specified postprocedural states: Secondary | ICD-10-CM

## 2014-03-20 DIAGNOSIS — S065X9A Traumatic subdural hemorrhage with loss of consciousness of unspecified duration, initial encounter: Secondary | ICD-10-CM

## 2014-03-20 DIAGNOSIS — S069X5A Unspecified intracranial injury with loss of consciousness greater than 24 hours with return to pre-existing conscious level, initial encounter: Secondary | ICD-10-CM

## 2014-03-20 MED ORDER — DIVALPROEX SODIUM ER 500 MG PO TB24: 500.0000 mg | ORAL_TABLET | Freq: Two times a day (BID) | ORAL | Status: DC
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
## 2014-03-20 NOTE — Progress Notes (Signed)
Note faxed to Dr Marisa SprinklesKraft at [phone] and Byrd Hesselbachhristopher Foster, FNP at [phone] with confirmation received.

## 2014-03-20 NOTE — Patient Instructions (Signed)
1.  We will switch Depakene to Depakote ER 500mg  twice daily. 2.  Check trough valproic acid level, CBC and CMP 3.  Continue speech therapy 4.  Ambulation with assistance 5.  Follow up in 3 months.

## 2014-04-08 ENCOUNTER — Non-Acute Institutional Stay (SKILLED_NURSING_FACILITY): Payer: Medicaid Other | Admitting: Internal Medicine

## 2014-04-08 DIAGNOSIS — F05 Delirium due to known physiological condition: Secondary | ICD-10-CM | POA: Diagnosis not present

## 2014-04-08 DIAGNOSIS — R569 Unspecified convulsions: Secondary | ICD-10-CM | POA: Diagnosis not present

## 2014-04-08 DIAGNOSIS — S069X0D Unspecified intracranial injury without loss of consciousness, subsequent encounter: Secondary | ICD-10-CM

## 2014-04-08 DIAGNOSIS — J9601 Acute respiratory failure with hypoxia: Secondary | ICD-10-CM

## 2014-04-08 DIAGNOSIS — E46 Unspecified protein-calorie malnutrition: Secondary | ICD-10-CM

## 2014-04-08 NOTE — Progress Notes (Signed)
[name], male   DOB: [birth year], 43 y.o.   MRN: [id number] .  this is a discharge note.  Level care skilled.  Facility [facility].    Chief Complaint  Patient presents with  . Discharge note    HPI: Patient is 43 y.o. male who has h/o TBI admitted after acute resp failure for OT/PT--he has done quite well with his rehabilitation-he came with a trach which has since been removed. He also has his PEG tube removed he is eating quite well  He is eating and ambulating significantly better  He will be going home with his mother who is very supportive-he will need continued PT and OT as well as nursing support-he still has some neurologic deficits including expressive aphasia.  .  Past Medical History  Diagnosis Date  . 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 Seizure   . DVT (deep venous thrombosis)     UE, LE DVT during hospitalization  . H/O renal calculi     Past Surgical History  Procedure Laterality Date  . Tracheostomy      feinstein      Medication List        t.               amantadine 50 MG/5ML solution  Commonly known as:  SYMMETREL  Take 100 mg by mouth 2 (two) times daily.     bromocriptine 2.5 MG tablet  Commonly known as:  PARLODEL  Take 5 mg by mouth 2 (two) times daily.     clonazePAM 1 MG tablet  Commonly known as:  KLONOPIN  Place 1 tablet (1 mg total) into feeding tube at bedtime.               metoprolol tartrate 25 mg/10 mL Susp  Commonly known as:  LOPRESSOR  12.5 mg by mouth twice a day .     mirtazapine 15 MG tablet  Commonly known as:  REMERON  Take 15 mg by mouth at bedtime.     ondansetron 4 MG tablet  Commonly known as:  ZOFRAN  Take 1 tablet (4 mg total) by mouth every 6 (six) hours.     oxyCODONE-acetaminophen 5-325 MG per tablet  Commonly known as:  PERCOCET/ROXICET  Take 1-2 tablets by mouth every 6 (six) hours as needed for moderate pain or severe pain.       pantoprazole sodium 40 mg/20 mL Pack  Commonly known as:  PROTONIX  Place 20 mLs (40 mg total) into feeding tube daily.     polyethylene glycol packet  Commonly known as:  MIRALAX / GLYCOLAX  Take 17 g by mouth daily as needed for mild constipation.     QUEtiapine 100 MG tablet  Commonly known as:  SEROQUEL  Take 1 tablet (100 mg total) by mouth twice a day during the day-and 200 mg daily at bedtime      rivaroxaban 20 MG Tabs tablet  Commonly known as:  XARELTO  Take 20 mg by mouth daily.     Valproic Acid 250 MG/5ML Syrp syrup  Commonly known as:  DEPAKENE  500 mg by mouth twice a day         No orders of the defined types were placed in this encounter.     There is no immunization history on file for this patient.  History  Substance Use Topics  . Smoking status: Current Every Day  Smoker -- 20 years  . Smokeless tobacco: Never Used  . Alcohol Use: No     Comment: Former ETOH abuse.  Quit Oct 2014.      Family history is noncontributory    Review of Systems   difficult to obtain secondary to patient's expressive aphasia-please see history of present illness-he does not complain of any acute pain shortness of breath chest pain he is ambulating significantly better-again has done very well.                       Physical Exam Temperature 97.7 pulse 60 respirations pressure 110/60  GENERAL APPEARANCE: Alert Appropriately groomed, non verbal but mutters No acute distress.   SKIN: No diaphoresis rash HEAD: Normocephalic, atraumatic  EYES: Conjunctiva/lids clear. Pupils round, reactive. EOMs intact.  EARS: External exam WNL, canals clear. Hearing grossly normal.  NOSE: No deformity or discharge.  RESPIRATORY: Breathing is even, unlabored. Lung sounds are clear ;  CARDIOVASCULAR: Heart RRR no murmurs, rubs or gallops. No peripheral edema.  GASTROINTESTINAL: Abdomen is soft, non-tender, not distended w/ normal bowel sounds; PEG  MUSCULOSKELETAL: No  abnormal joints or musculature is now ambulatory but quite weak NEUROLOGIC: .  Moves all extremities no tremor--very dysarthric speech cannot really understand . PSYCHIATRIC: , no behavioral issues  Patient Active Problem List   Diagnosis Date Noted  . Dvt femoral (deep venous thrombosis) 01/05/2014  . Protein-calorie malnutrition, severe 01/05/2014  . Alcohol abuse 01/05/2014  . Acute respiratory failure with hypoxia 12/20/2013  . Delirium due to general medical condition 12/13/2013  . HCAP (healthcare-associated pneumonia) 12/13/2013  . Sepsis 12/13/2013  . TBI (traumatic brain injury) 12/12/2013   Labs  03/22/2014.  WBC 6.7 hemoglobin 11.4 platelets 137.  Sodium 139 potassium 4.3 BUN 20 creatinine 1.0-liver function tests within normal limits except alkaline phosphatase of 174-valproic acid level was 14.2.  Marland Kitchen.  01/14/2014.  WBC 7.0 hemoglobin 11.4 platelets 145.  Sodium 139 potassium 4.1 BUN 18 creatinine 0.8.    CBC    Component Value Date/Time   WBC 15.0* 01/03/2014 0513   RBC 3.90* 01/03/2014 0513   HGB 10.7* 01/03/2014 0513   HCT 33.7* 01/03/2014 0513   PLT 340 01/03/2014 0513   MCV 86.4 01/03/2014 0513   LYMPHSABS 1.6 12/13/2013 0446   MONOABS 1.6* 12/13/2013 0446   EOSABS 0.1 12/13/2013 0446   BASOSABS 0.0 12/13/2013 0446    CMP     Component Value Date/Time   NA 139 01/02/2014 0020   K 4.4 01/02/2014 0020   CL 100 01/02/2014 0020   CO2 28 01/02/2014 0020   GLUCOSE 118* 01/02/2014 0020   BUN 16 01/02/2014 0020   CREATININE 0.61 01/02/2014 0020   CALCIUM 9.7 01/02/2014 0020   PROT 6.8 12/22/2013 0319   ALBUMIN 1.9* 12/22/2013 0319   AST 25 12/22/2013 0319   ALT 16 12/22/2013 0319   ALKPHOS 230* 12/22/2013 0319   BILITOT <0.2* 12/22/2013 0319   GFRNONAA >90 01/02/2014 0020   GFRAA >90 01/02/2014 0020    Assessment and Plan  Acute respiratory failure with hypoxia from HCAP. CT angio negative for PE. He was intubated initially by PCCM and underwent attempt at extubation on  8/30, however, he required reintubation on 8/31 due to agitation, secretions, and respiratory distress. On 9/1, he underwent tracheostomy placement and was eventually taken off the ventilator on 9/4.  Marland Kitchen. Since then the trach has been removed he has tolerated this quite well does not complaining of  shortness of breath or congestion   HCAP (healthcare-associated pneumonia) , developed 8/26.  [name] 8/26>>9/2  - Vancomycin 8/27>>9/2  - blood cx 8/26>>neg  - 8/27 sputum>>nl flora This appears resolved    Dvt femoral (deep venous thrombosis) Continue xarelto  Protein-calorie malnutrition, severe  Status post peg tube placement apparently is eating quite well    Delirium due to general medical condition With severe agitation with underlying traumatic brain injury after seizure / fall. He has residual expressive aphasia and RUE weakness. CT head was completed twice and demonstrated only residual changes from previous TBI but no acute changes.  - With therapy he has gained significant strength-behaviors have significantly improved as well he is pleasant and cooperative smiling today -   TBI (traumatic brain injury) As above from seizure with fall from standing;ETOH was involved  Alcohol abuse  Amantadine, Bromocriptine, Remeron . He was seen by neurology  - Neurology followed, no further imaging recommended  History of seizure disorder-he is on valproic acid there've been no recent seizures to my knowledge  Will update labs including a CBC a metabolic panel valproic acid level   Patient will be going home with family who is very supportive-will need again continued PT and OT for strengthening since he still quite weak with ambulation as well as nursing support for his multiple medical issues.  [id number] note greater than 30 minutes spent on this discharge summary-scripts have been written

## 2014-04-10 ENCOUNTER — Ambulatory Visit: Payer: Self-pay | Admitting: Internal Medicine

## 2014-04-12 ENCOUNTER — Encounter: Payer: Self-pay | Admitting: Internal Medicine

## 2014-05-03 ENCOUNTER — Other Ambulatory Visit: Payer: Self-pay | Admitting: Internal Medicine

## 2014-05-06 ENCOUNTER — Other Ambulatory Visit: Payer: Self-pay | Admitting: Internal Medicine

## 2014-05-08 ENCOUNTER — Other Ambulatory Visit: Payer: Self-pay | Admitting: Internal Medicine

## 2014-05-08 ENCOUNTER — Telehealth: Payer: Self-pay | Admitting: Neurology

## 2014-05-08 NOTE — Telephone Encounter (Signed)
Pt called to cancel his f/u on 06/21/14. Pt did not give a reason.

## 2014-06-21 ENCOUNTER — Ambulatory Visit: Payer: Self-pay | Admitting: Neurology

## 2014-06-25 ENCOUNTER — Emergency Department (HOSPITAL_COMMUNITY): Payer: Medicaid Other

## 2014-06-25 ENCOUNTER — Inpatient Hospital Stay (HOSPITAL_COMMUNITY)
Admission: EM | Admit: 2014-06-25 | Discharge: 2014-06-27 | DRG: 392 | Disposition: A | Discharge status: Home / Self Care | Payer: Medicaid Other | Attending: Internal Medicine | Admitting: Internal Medicine

## 2014-06-25 ENCOUNTER — Encounter (HOSPITAL_COMMUNITY): Payer: Self-pay | Admitting: Emergency Medicine

## 2014-06-25 DIAGNOSIS — R1310 Dysphagia, unspecified: Principal | ICD-10-CM

## 2014-06-25 DIAGNOSIS — N179 Acute kidney failure, unspecified: Secondary | ICD-10-CM | POA: Diagnosis present

## 2014-06-25 DIAGNOSIS — D696 Thrombocytopenia, unspecified: Secondary | ICD-10-CM | POA: Diagnosis present

## 2014-06-25 DIAGNOSIS — Z7901 Long term (current) use of anticoagulants: Secondary | ICD-10-CM | POA: Diagnosis not present

## 2014-06-25 DIAGNOSIS — Z87820 Personal history of traumatic brain injury: Secondary | ICD-10-CM

## 2014-06-25 DIAGNOSIS — I10 Essential (primary) hypertension: Secondary | ICD-10-CM | POA: Diagnosis present

## 2014-06-25 DIAGNOSIS — K219 Gastro-esophageal reflux disease without esophagitis: Secondary | ICD-10-CM | POA: Diagnosis present

## 2014-06-25 DIAGNOSIS — N189 Chronic kidney disease, unspecified: Secondary | ICD-10-CM | POA: Diagnosis present

## 2014-06-25 DIAGNOSIS — S069XAA Unspecified intracranial injury with loss of consciousness status unknown, initial encounter: Secondary | ICD-10-CM | POA: Diagnosis present

## 2014-06-25 DIAGNOSIS — S069X9A Unspecified intracranial injury with loss of consciousness of unspecified duration, initial encounter: Secondary | ICD-10-CM | POA: Diagnosis present

## 2014-06-25 DIAGNOSIS — I82419 Acute embolism and thrombosis of unspecified femoral vein: Secondary | ICD-10-CM

## 2014-06-25 DIAGNOSIS — K7460 Unspecified cirrhosis of liver: Secondary | ICD-10-CM | POA: Diagnosis present

## 2014-06-25 DIAGNOSIS — Z930 Tracheostomy status: Secondary | ICD-10-CM | POA: Diagnosis not present

## 2014-06-25 DIAGNOSIS — Z87891 Personal history of nicotine dependence: Secondary | ICD-10-CM | POA: Diagnosis not present

## 2014-06-25 DIAGNOSIS — F329 Major depressive disorder, single episode, unspecified: Secondary | ICD-10-CM | POA: Diagnosis present

## 2014-06-25 DIAGNOSIS — Z86718 Personal history of other venous thrombosis and embolism: Secondary | ICD-10-CM

## 2014-06-25 DIAGNOSIS — E860 Dehydration: Secondary | ICD-10-CM | POA: Diagnosis present

## 2014-06-25 DIAGNOSIS — Z79899 Other long term (current) drug therapy: Secondary | ICD-10-CM

## 2014-06-25 LAB — CBC WITH DIFFERENTIAL/PLATELET
BASOS PCT: 0 % (ref 0–1)
Basophils Absolute: 0 10*3/uL (ref 0.0–0.1)
EOS ABS: 0.1 10*3/uL (ref 0.0–0.7)
Eosinophils Relative: 1 % (ref 0–5)
HEMATOCRIT: 44 % (ref 39.0–52.0)
Hemoglobin: 14.3 g/dL (ref 13.0–17.0)
Lymphocytes Relative: 21 % (ref 12–46)
Lymphs Abs: 2.1 10*3/uL (ref 0.7–4.0)
MCH: 31.6 pg (ref 26.0–34.0)
MCHC: 32.5 g/dL (ref 30.0–36.0)
MCV: 97.3 fL (ref 78.0–100.0)
MONO ABS: 1.5 10*3/uL — AB (ref 0.1–1.0)
Monocytes Relative: 14 % — ABNORMAL HIGH (ref 3–12)
Neutro Abs: 6.6 10*3/uL (ref 1.7–7.7)
Neutrophils Relative %: 64 % (ref 43–77)
Platelets: 138 10*3/uL — ABNORMAL LOW (ref 150–400)
RBC: 4.52 MIL/uL (ref 4.22–5.81)
RDW: 17.2 % — ABNORMAL HIGH (ref 11.5–15.5)
WBC: 10.3 10*3/uL (ref 4.0–10.5)

## 2014-06-25 LAB — COMPREHENSIVE METABOLIC PANEL
ALT: 22 U/L (ref 0–53)
ANION GAP: 14 (ref 5–15)
AST: 31 U/L (ref 0–37)
Albumin: 4.6 g/dL (ref 3.5–5.2)
Alkaline Phosphatase: 122 U/L — ABNORMAL HIGH (ref 39–117)
BILIRUBIN TOTAL: 0.6 mg/dL (ref 0.3–1.2)
BUN: 34 mg/dL — AB (ref 6–23)
CHLORIDE: 103 mmol/L (ref 96–112)
CO2: 28 mmol/L (ref 19–32)
CREATININE: 1.45 mg/dL — AB (ref 0.50–1.35)
Calcium: 10.2 mg/dL (ref 8.4–10.5)
GFR calc Af Amer: 66 mL/min — ABNORMAL LOW (ref >90)
GFR calc non Af Amer: 57 mL/min — ABNORMAL LOW (ref >90)
Glucose, Bld: 131 mg/dL — ABNORMAL HIGH (ref 70–99)
Potassium: 3.9 mmol/L (ref 3.5–5.1)
Sodium: 145 mmol/L (ref 135–145)
TOTAL PROTEIN: 9 g/dL — AB (ref 6.0–8.3)

## 2014-06-25 LAB — LIPASE, BLOOD: Lipase: 22 U/L (ref 11–59)

## 2014-06-25 MED ORDER — ONDANSETRON HCL 4 MG/2ML IJ SOLN: 4.0000 mg | Freq: Four times a day (QID) | INTRAMUSCULAR | Status: DC | PRN

## 2014-06-25 MED ORDER — VALPROATE SODIUM 500 MG/5ML IV SOLN
500.0000 mg | Freq: Two times a day (BID) | INTRAVENOUS | Status: DC
  Administered 2014-06-26 – 2014-06-27 (×4): 500 mg via INTRAVENOUS
  Filled 2014-06-25 (×5): qty 5

## 2014-06-25 MED ORDER — MORPHINE SULFATE 2 MG/ML IJ SOLN: 1.0000 mg | INTRAMUSCULAR | Status: DC | PRN

## 2014-06-25 MED ORDER — DEXTROSE-NACL 5-0.9 % IV SOLN: INTRAVENOUS | Status: AC

## 2014-06-25 MED ORDER — ACETAMINOPHEN 650 MG RE SUPP: 650.0000 mg | Freq: Four times a day (QID) | RECTAL | Status: DC | PRN

## 2014-06-25 MED ORDER — ONDANSETRON HCL 4 MG PO TABS: 4.0000 mg | ORAL_TABLET | Freq: Four times a day (QID) | ORAL | Status: DC | PRN

## 2014-06-25 MED ORDER — METOPROLOL TARTRATE 1 MG/ML IV SOLN
2.5000 mg | Freq: Two times a day (BID) | INTRAVENOUS | Status: DC
  Administered 2014-06-26 – 2014-06-27 (×4): 2.5 mg via INTRAVENOUS
  Filled 2014-06-25 (×7): qty 5

## 2014-06-25 MED ORDER — ONDANSETRON HCL 4 MG/2ML IJ SOLN
4.0000 mg | Freq: Once | INTRAMUSCULAR | Status: AC
  Administered 2014-06-25: 4 mg via INTRAVENOUS
  Filled 2014-06-25: qty 2

## 2014-06-25 MED ORDER — ACETAMINOPHEN 325 MG PO TABS: 650.0000 mg | ORAL_TABLET | Freq: Four times a day (QID) | ORAL | Status: DC | PRN

## 2014-06-25 MED ORDER — DEXTROSE-NACL 5-0.9 % IV SOLN
INTRAVENOUS | Status: DC
  Administered 2014-06-26 (×2): via INTRAVENOUS

## 2014-06-25 NOTE — Progress Notes (Signed)
Attempted to call for report three times, no answer.

## 2014-06-25 NOTE — ED Notes (Signed)
Father and patient state that patient has had wheezing and abdominal pain today. MD thinks that he may have aspirated because of his previous hx of difficulty speaking/swallowing. Alert and oriented per norm. O2 95%.

## 2014-06-25 NOTE — ED Provider Notes (Signed)
CSN: [id number]     Arrival date & time 06/25/14  1709 History   First MD Initiated Contact with Patient 06/25/14 1829     Chief Complaint  Patient presents with  . Wheezing  . Abdominal Pain     (Consider location/radiation/quality/duration/timing/severity/associated sxs/prior Treatment) Patient is a 44 y.o. male presenting with general illness.  Illness Location:  Abd Quality:  Inability to eat normally, coughing Severity:  Moderate Onset quality:  Gradual Duration:  2 days Timing:  Constant Progression:  Unchanged Chronicity:  New Context:  HO TBI with prior difficulty swallowing with swallowing study Relieved by:  Nothing Worsened by:  Nothing Associated symptoms: abdominal pain and cough   Associated symptoms: no chest pain, no fever, no loss of consciousness and no vomiting     Past Medical History  Diagnosis Date  . TBI (traumatic brain injury)     07/2013, fall from standing post seizure  . Seizure   . DVT (deep venous thrombosis)     UE, LE DVT during hospitalization  . H/O renal calculi   . Cirrhosis   . Depression   . GERD (gastroesophageal reflux disease)    Past Surgical History  Procedure Laterality Date  . Tracheostomy      feinstein  . Gastrostomy tube placement    . Removal of gastrostomy tube     Family History  Problem Relation Age of Onset  . Congestive Heart Failure Brother     Deceased age 66  . Hypercholesterolemia Mother   . Hypertension Mother    History  Substance Use Topics  . Smoking status: Former Smoker -- 20 years    Quit date: 08/12/2013  . Smokeless tobacco: Never Used  . Alcohol Use: No     Comment: Former ETOH abuse.  Quit Oct 2014.      Review of Systems  Constitutional: Negative for fever.  Respiratory: Positive for cough.   Cardiovascular: Negative for chest pain.  Gastrointestinal: Positive for abdominal pain. Negative for vomiting.  Neurological: Negative for loss of consciousness.  All other systems reviewed  and are negative.     Allergies  Ativan  Home Medications   Prior to Admission medications   Medication Sig Start Date End Date Taking? Authorizing Provider  amantadine (SYMMETREL) 100 MG capsule Take 100 mg by mouth 2 (two) times daily.   Yes Historical Provider, MD  bromocriptine (PARLODEL) 2.5 MG tablet Take 5 mg by mouth 2 (two) times daily.   Yes Historical Provider, MD  clonazePAM (KLONOPIN) 0.5 MG tablet Take 0.5 mg by mouth 2 (two) times daily as needed for anxiety.   Yes Historical Provider, MD  divalproex (DEPAKOTE ER) 500 MG 24 hr tablet Take 1 tablet (500 mg total) by mouth 2 (two) times daily. 03/20/14  Yes Adam Gus Rankin, DO  docusate sodium (COLACE) 100 MG capsule Take 100 mg by mouth 2 (two) times daily.   Yes Historical Provider, MD  escitalopram (LEXAPRO) 10 MG tablet Take 10 mg by mouth daily.   Yes Historical Provider, MD  furosemide (LASIX) 20 MG tablet Take 20 mg by mouth daily.   Yes Historical Provider, MD  magnesium hydroxide (MILK OF MAGNESIA) 400 MG/5ML suspension Take 30 mLs by mouth daily as needed for mild constipation.   Yes Historical Provider, MD  metoprolol tartrate (LOPRESSOR) 25 MG tablet Take 12.5 mg by mouth 2 (two) times daily.   Yes Historical Provider, MD  ondansetron (ZOFRAN) 4 MG tablet Take 1 tablet (4 mg total) by  mouth every 6 (six) hours. 12/10/13  Yes Mora Bellman, PA-C  oxyCODONE-acetaminophen (PERCOCET/ROXICET) 5-325 MG per tablet Take 1-2 tablets by mouth every 6 (six) hours as needed for moderate pain or severe pain. 01/22/14  Yes Mahima Pandey, MD  pantoprazole (PROTONIX) 40 MG tablet Take 40 mg by mouth daily.   Yes Historical Provider, MD  polyethylene glycol (MIRALAX / GLYCOLAX) packet Take 17 g by mouth daily as needed for mild constipation.   Yes Historical Provider, MD  QUEtiapine (SEROQUEL) 100 MG tablet Take 1 tablet (100 mg total) by mouth 3 (three) times daily. Patient taking differently: Take 100 mg by mouth 2 (two) times  daily.  01/03/14  Yes Renae Fickle, MD  rivaroxaban (XARELTO) 20 MG TABS tablet Take 20 mg by mouth daily.   Yes Historical Provider, MD  spironolactone (ALDACTONE) 50 MG tablet Take 50 mg by mouth daily.   Yes Historical Provider, MD  Valproic Acid (DEPAKENE) 250 MG/5ML SYRP syrup Place 10 mLs (500 mg total) into feeding tube every 12 (twelve) hours. 01/01/14  Yes Dorothea Ogle, MD  mirtazapine (REMERON) 15 MG tablet Take 15 mg by mouth at bedtime.  12/07/13 01/06/14  Historical Provider, MD   BP 104/65 mmHg  Pulse 82  Temp(Src) 98.2 F (36.8 C) (Oral)  Resp 18  Ht  (1.753 m)  Wt 127 lb 12.8 oz (57.97 kg)  BMI 18.86 kg/m2  SpO2 95% Physical Exam  Constitutional: He is oriented to person, place, and time. He appears well-developed and well-nourished.  HENT:  Head: Normocephalic and atraumatic.  Eyes: Conjunctivae and EOM are normal.  Neck: Normal range of motion. Neck supple.  Cardiovascular: Normal rate, regular rhythm and normal heart sounds.   Pulmonary/Chest: Effort normal and breath sounds normal. No respiratory distress.  Abdominal: He exhibits no distension. There is generalized tenderness. There is no rebound and no guarding.  Musculoskeletal: Normal range of motion.  Neurological: He is alert and oriented to person, place, and time.  Skin: Skin is warm and dry.  Vitals reviewed.   ED Course  Procedures (including critical care time) Labs Review Labs Reviewed  CBC WITH DIFFERENTIAL/PLATELET - Abnormal; Notable for the following:    RDW 17.2 (*)    Platelets 138 (*)    Monocytes Relative 14 (*)    Monocytes Absolute 1.5 (*)    All other components within normal limits  COMPREHENSIVE METABOLIC PANEL - Abnormal; Notable for the following:    Glucose, Bld 131 (*)    BUN 34 (*)    Creatinine, Ser 1.45 (*)    Total Protein 9.0 (*)    Alkaline Phosphatase 122 (*)    GFR calc non Af Amer 57 (*)    GFR calc Af Amer 66 (*)    All other components within normal limits   COMPREHENSIVE METABOLIC PANEL - Abnormal; Notable for the following:    Glucose, Bld 126 (*)    BUN 30 (*)    GFR calc non Af Amer 72 (*)    GFR calc Af Amer 83 (*)    All other components within normal limits  CBC WITH DIFFERENTIAL/PLATELET - Abnormal; Notable for the following:    RBC 4.04 (*)    Hemoglobin 12.8 (*)    RDW 17.6 (*)    Platelets 116 (*)    All other components within normal limits  VALPROIC ACID LEVEL - Abnormal; Notable for the following:    Valproic Acid Lvl 36.9 (*)    All  other components within normal limits  APTT - Abnormal; Notable for the following:    aPTT 49 (*)    All other components within normal limits  PROTIME-INR - Abnormal; Notable for the following:    Prothrombin Time 19.1 (*)    INR 1.59 (*)    All other components within normal limits  HEPARIN LEVEL (UNFRACTIONATED) - Abnormal; Notable for the following:    Heparin Unfractionated 1.48 (*)    All other components within normal limits  GLUCOSE, CAPILLARY - Abnormal; Notable for the following:    Glucose-Capillary 120 (*)    All other components within normal limits  MRSA PCR SCREENING  LIPASE, BLOOD  URINALYSIS, ROUTINE W REFLEX MICROSCOPIC  HEPARIN LEVEL (UNFRACTIONATED)  APTT    Imaging Review Dg Chest 2 View  06/25/2014   CLINICAL DATA:  Wheezing and abdominal pain today. Abnormal breath sounds.  EXAM: CHEST  2 VIEW  COMPARISON:  01/02/2014.  FINDINGS: Cardiopericardial silhouette and mediastinal contours are within normal limits, unchanged from prior. The tracheostomy is no longer present. Basilar subsegmental atelectasis and pulmonary parenchymal scarring is present. On the lateral view, there is increased density over the lower lobes which is favored to represent atelectasis based on the frontal view. Infection or aspiration are considered less likely. The appearance of the chest is little changed comparing to prior frontal views.  IMPRESSION: No acute cardiopulmonary disease. Bilateral  basilar atelectasis and/or scarring.   Electronically Signed   By: Andreas NewportGeoffrey  Lamke M.D.   On: 06/25/2014 20:01   Ct Head Wo Contrast  06/26/2014   CLINICAL DATA:  Traumatic brain injury.  Seizure.  EXAM: CT HEAD WITHOUT CONTRAST  TECHNIQUE: Contiguous axial images were obtained from the base of the skull through the vertex without intravenous contrast.  COMPARISON:  12/20/2013  FINDINGS: Postoperative changes from left frontal craniotomy are again noted. The left lateral ventricle remains enlarged. There is effacement of the overlying parietal and occipital sulci. This is a stable finding. Atrophy has further progressed involving the left parietal and occipital lobes. Encephalomalacia in the left upper lobe persists. No midline shift. Mild chronic ischemic changes in the periventricular white matter about the frontal horns.  No acute hemorrhage. No new suspicious low-density to suggest acute ischemic change.  No acute fracture. Mastoid air cells are clear. Opacity in the right maxillary sinus persists.  IMPRESSION: There has been further atrophy involving the left parietal and occipital lobes. Enlargement of the left lateral ventricle is likely secondary to ex vacuo dilatation.   Electronically Signed   By: Jolaine ClickArthur  Hoss M.D.   On: 06/26/2014 10:22     EKG Interpretation None      MDM   Final diagnoses:  Dysphagia  Dvt femoral (deep venous thrombosis)    44 y.o. male with pertinent PMH of TBI with prior swallowing difficulty with swallow wu and prior PEG tube presents with difficulty taking PO over the last 2 days.  Symptoms began on taking morning pills. No large foods normally associated with food impaction, and he has been minimally tolerant of PO intake since that time, but not vomiting and is able to ingest water and handle his secretions.  On arrival vitals and physical exam as above.  Pt tolerated PO intake.  Wu with AKI.  Likely dehydration mediated.  Consulted medicine for admission.     I have reviewed all laboratory and imaging studies if ordered as above  1. Dvt femoral (deep venous thrombosis)   2. Dysphagia  Mirian Mo, MD 06/26/14 1110

## 2014-06-25 NOTE — ED Notes (Signed)
Patient states he is still unable to void.

## 2014-06-25 NOTE — ED Notes (Signed)
Attempted to call report, no answer

## 2014-06-25 NOTE — H&P (Signed)
[facility] History and Physical  Hector [name] ZOX:[id number] DOB: 11-08-1970 DOA: 06/25/2014  Referring physician: ER physician. PCP: Mariea Clonts, NP   History obtained from patient's father.  Chief Complaint: Difficulty swallowing.  HPI: Hector [name] is a 44 y.o. male with history of traumatic brain injury, DVT, seizures was brought to the ER after patient was found to have increasing difficulty swallowing since morning. Patient was admitted in September for acute respiratory failure and at that time patient was intubated and eventually had tracheostomy and also was placed on PEG tube. Patient's father states that patient has been eating well recently but today started having some choking episodes. Patient is finding it difficult to swallow. In the ER labs so mild worsening of his renal function and has been admitted for further management of his dysphagia and dehydration.   Review of Systems: As presented in the history of presenting illness, rest negative.  Past Medical History  Diagnosis Date  . TBI (traumatic brain injury)     07/2013, fall from standing post seizure  . Seizure   . DVT (deep venous thrombosis)     UE, LE DVT during hospitalization  . H/O renal calculi   . Cirrhosis   . Depression   . GERD (gastroesophageal reflux disease)    Past Surgical History  Procedure Laterality Date  . Tracheostomy      feinstein  . Gastrostomy tube placement    . Removal of gastrostomy tube     Social History:  reports that he quit smoking about 10 months ago. He has never used smokeless tobacco. He reports that he does not drink alcohol or use illicit drugs. Where does patient live home. Can patient participate in ADLs? No.  Allergies  Allergen Reactions  . Ativan [Lorazepam]     Pt has paradoxical effect with sedating medications.     Family History:  Family History  Problem Relation Age of Onset  . Congestive Heart Failure Brother     Deceased  age 55  . Hypercholesterolemia Mother   . Hypertension Mother       Prior to Admission medications   Medication Sig Start Date End Date Taking? Authorizing Provider  amantadine (SYMMETREL) 100 MG capsule Take 100 mg by mouth 2 (two) times daily.   Yes Historical Provider, MD  bromocriptine (PARLODEL) 2.5 MG tablet Take 5 mg by mouth 2 (two) times daily.   Yes Historical Provider, MD  clonazePAM (KLONOPIN) 0.5 MG tablet Take 0.5 mg by mouth 2 (two) times daily as needed for anxiety.   Yes Historical Provider, MD  divalproex (DEPAKOTE ER) 500 MG 24 hr tablet Take 1 tablet (500 mg total) by mouth 2 (two) times daily. 03/20/14  Yes Adam Gus Rankin, DO  docusate sodium (COLACE) 100 MG capsule Take 100 mg by mouth 2 (two) times daily.   Yes Historical Provider, MD  escitalopram (LEXAPRO) 10 MG tablet Take 10 mg by mouth daily.   Yes Historical Provider, MD  furosemide (LASIX) 20 MG tablet Take 20 mg by mouth daily.   Yes Historical Provider, MD  magnesium hydroxide (MILK OF MAGNESIA) 400 MG/5ML suspension Take 30 mLs by mouth daily as needed for mild constipation.   Yes Historical Provider, MD  metoprolol tartrate (LOPRESSOR) 25 MG tablet Take 12.5 mg by mouth 2 (two) times daily.   Yes Historical Provider, MD  ondansetron (ZOFRAN) 4 MG tablet Take 1 tablet (4 mg total) by mouth every 6 (six) hours. 12/10/13  Yes Dahlia Client  S Merrell, PA-C  oxyCODONE-acetaminophen (PERCOCET/ROXICET) 5-325 MG per tablet Take 1-2 tablets by mouth every 6 (six) hours as needed for moderate pain or severe pain. 01/22/14  Yes Mahima Pandey, MD  pantoprazole (PROTONIX) 40 MG tablet Take 40 mg by mouth daily.   Yes Historical Provider, MD  polyethylene glycol (MIRALAX / GLYCOLAX) packet Take 17 g by mouth daily as needed for mild constipation.   Yes Historical Provider, MD  QUEtiapine (SEROQUEL) 100 MG tablet Take 1 tablet (100 mg total) by mouth 3 (three) times daily. Patient taking differently: Take 100 mg by mouth 2 (two)  times daily.  01/03/14  Yes Renae FickleMackenzie Short, MD  rivaroxaban (XARELTO) 20 MG TABS tablet Take 20 mg by mouth daily.   Yes Historical Provider, MD  spironolactone (ALDACTONE) 50 MG tablet Take 50 mg by mouth daily.   Yes Historical Provider, MD  Valproic Acid (DEPAKENE) 250 MG/5ML SYRP syrup Place 10 mLs (500 mg total) into feeding tube every 12 (twelve) hours. 01/01/14  Yes Dorothea OgleIskra M Myers, MD  mirtazapine (REMERON) 15 MG tablet Take 15 mg by mouth at bedtime.  12/07/13 01/06/14  Historical Provider, MD    Physical Exam: Filed Vitals:   06/25/14 2059 06/25/14 2242 06/25/14 2301 06/25/14 2307  BP: 112/76 114/70  122/82  Pulse: 80 82  78  Temp:  97.8 F (36.6 C)  98.4 F (36.9 C)  TempSrc:  Oral  Oral  Resp: 18 18  18   Height:   5\' 9"  (1.753 m)   Weight:   57.97 kg (127 lb 12.8 oz)   SpO2: 98% 95%  96%     General:  Moderately built and nourished.  Eyes: Anicteric no pallor.  ENT: No discharge from the ears eyes nose and mouth.  Neck: No mass felt. Tracheostomy scar is seen.  Cardiovascular: S1-S2 heard.  Respiratory: No rhonchi or crepitations.  Abdomen: Soft nontender bowel sounds present. PEG tube scar seen.  Skin: No rash.  Musculoskeletal: No edema.  Psychiatric: Patient follows commands.  Neurologic: Alert awake and follows commands and moves all extremities.  Labs on Admission:  Basic Metabolic Panel:  Recent Labs Lab 06/25/14 1822  NA 145  K 3.9  CL 103  CO2 28  GLUCOSE 131*  BUN 34*  CREATININE 1.45*  CALCIUM 10.2   Liver Function Tests:  Recent Labs Lab 06/25/14 1822  AST 31  ALT 22  ALKPHOS 122*  BILITOT 0.6  PROT 9.0*  ALBUMIN 4.6    Recent Labs Lab 06/25/14 1822  LIPASE 22   No results for input(s): AMMONIA in the last 168 hours. CBC:  Recent Labs Lab 06/25/14 1822  WBC 10.3  NEUTROABS 6.6  HGB 14.3  HCT 44.0  MCV 97.3  PLT 138*   Cardiac Enzymes: No results for input(s): CKTOTAL, CKMB, CKMBINDEX, TROPONINI in the last  168 hours.  BNP (last 3 results) No results for input(s): BNP in the last 8760 hours.  ProBNP (last 3 results)  Recent Labs  12/13/13 1114  PROBNP 729.4*    CBG: No results for input(s): GLUCAP in the last 168 hours.  Radiological Exams on Admission: Dg Chest 2 View  06/25/2014   CLINICAL DATA:  Wheezing and abdominal pain today. Abnormal breath sounds.  EXAM: CHEST  2 VIEW  COMPARISON:  01/02/2014.  FINDINGS: Cardiopericardial silhouette and mediastinal contours are within normal limits, unchanged from prior. The tracheostomy is no longer present. Basilar subsegmental atelectasis and pulmonary parenchymal scarring is present. On the lateral view,  there is increased density over the lower lobes which is favored to represent atelectasis based on the frontal view. Infection or aspiration are considered less likely. The appearance of the chest is little changed comparing to prior frontal views.  IMPRESSION: No acute cardiopulmonary disease. Bilateral basilar atelectasis and/or scarring.   Electronically Signed   By: Andreas Newport M.D.   On: 06/25/2014 20:01     Assessment/Plan Principal Problem:   Dysphagia Active Problems:   TBI (traumatic brain injury)   Dvt femoral (deep venous thrombosis)   AKI (acute kidney injury)   1. Dysphagia - cause not clear. Will get swallow evaluation in a.m. Check CT head. May need GI consult if patient fails swallow. 2. Acute renal failure with dehydration - probably secondary to poor oral intake. We will gently hydrate him closely for intake output and metabolic panel. 3. History of DVT - since patient's swallow is not reliable at this time I have placed patient on IV heparin until patient can take orally patient's Xarelto. 4. History of seizures - patient is on Depakote. We'll keep IV Depakote for now and check Depakote levels. 5. History of traumatic brain injury.   DVT Prophylaxis on full dose heparin.  Code Status: Full code.  Family  Communication: Patient's father at the bedside.  Disposition Plan: Admit to inpatient.    KAKRAKANDY,ARSHAD N. [facility] Pager [phone].  If 7PM-7AM, please contact night-coverage [email or web address] Password [facility] 06/25/2014, 11:39 PM

## 2014-06-25 NOTE — ED Notes (Signed)
MD at bedside. 

## 2014-06-25 NOTE — ED Notes (Signed)
Pt given PO challenge able to tolerate liquids. Some pain during swallowing at trach site.

## 2014-06-26 ENCOUNTER — Inpatient Hospital Stay (HOSPITAL_COMMUNITY): Payer: Medicaid Other

## 2014-06-26 ENCOUNTER — Encounter (HOSPITAL_COMMUNITY): Payer: Self-pay | Admitting: Radiology

## 2014-06-26 DIAGNOSIS — D696 Thrombocytopenia, unspecified: Secondary | ICD-10-CM

## 2014-06-26 DIAGNOSIS — S069X0S Unspecified intracranial injury without loss of consciousness, sequela: Secondary | ICD-10-CM

## 2014-06-26 LAB — CBC WITH DIFFERENTIAL/PLATELET
Basophils Absolute: 0 10*3/uL (ref 0.0–0.1)
Basophils Relative: 0 % (ref 0–1)
EOS PCT: 2 % (ref 0–5)
Eosinophils Absolute: 0.2 10*3/uL (ref 0.0–0.7)
HEMATOCRIT: 39.5 % (ref 39.0–52.0)
HEMOGLOBIN: 12.8 g/dL — AB (ref 13.0–17.0)
LYMPHS ABS: 2 10*3/uL (ref 0.7–4.0)
Lymphocytes Relative: 20 % (ref 12–46)
MCH: 31.7 pg (ref 26.0–34.0)
MCHC: 32.4 g/dL (ref 30.0–36.0)
MCV: 97.8 fL (ref 78.0–100.0)
MONOS PCT: 10 % (ref 3–12)
Monocytes Absolute: 1 10*3/uL (ref 0.1–1.0)
NEUTROS PCT: 68 % (ref 43–77)
Neutro Abs: 6.6 10*3/uL (ref 1.7–7.7)
Platelets: 116 10*3/uL — ABNORMAL LOW (ref 150–400)
RBC: 4.04 MIL/uL — AB (ref 4.22–5.81)
RDW: 17.6 % — ABNORMAL HIGH (ref 11.5–15.5)
WBC: 9.7 10*3/uL (ref 4.0–10.5)

## 2014-06-26 LAB — URINALYSIS, ROUTINE W REFLEX MICROSCOPIC
Bilirubin Urine: NEGATIVE
Glucose, UA: NEGATIVE mg/dL
HGB URINE DIPSTICK: NEGATIVE
Ketones, ur: NEGATIVE mg/dL
Leukocytes, UA: NEGATIVE
Nitrite: NEGATIVE
Protein, ur: NEGATIVE mg/dL
SPECIFIC GRAVITY, URINE: 1.021 (ref 1.005–1.030)
Urobilinogen, UA: 1 mg/dL (ref 0.0–1.0)
pH: 5.5 (ref 5.0–8.0)

## 2014-06-26 LAB — COMPREHENSIVE METABOLIC PANEL
ALBUMIN: 4 g/dL (ref 3.5–5.2)
ALT: 19 U/L (ref 0–53)
AST: 23 U/L (ref 0–37)
Alkaline Phosphatase: 109 U/L (ref 39–117)
Anion gap: 5 (ref 5–15)
BUN: 30 mg/dL — ABNORMAL HIGH (ref 6–23)
CALCIUM: 9.3 mg/dL (ref 8.4–10.5)
CHLORIDE: 107 mmol/L (ref 96–112)
CO2: 31 mmol/L (ref 19–32)
CREATININE: 1.2 mg/dL (ref 0.50–1.35)
GFR calc Af Amer: 83 mL/min — ABNORMAL LOW (ref >90)
GFR calc non Af Amer: 72 mL/min — ABNORMAL LOW (ref >90)
Glucose, Bld: 126 mg/dL — ABNORMAL HIGH (ref 70–99)
Potassium: 4 mmol/L (ref 3.5–5.1)
SODIUM: 143 mmol/L (ref 135–145)
Total Bilirubin: 0.9 mg/dL (ref 0.3–1.2)
Total Protein: 7.7 g/dL (ref 6.0–8.3)

## 2014-06-26 LAB — GLUCOSE, CAPILLARY
GLUCOSE-CAPILLARY: 120 mg/dL — AB (ref 70–99)
GLUCOSE-CAPILLARY: 103 mg/dL — AB (ref 70–99)
Glucose-Capillary: 116 mg/dL — ABNORMAL HIGH (ref 70–99)

## 2014-06-26 LAB — HEPARIN LEVEL (UNFRACTIONATED)
HEPARIN UNFRACTIONATED: 0.77 [IU]/mL — AB (ref 0.30–0.70)
Heparin Unfractionated: 1.48 IU/mL — ABNORMAL HIGH (ref 0.30–0.70)
Heparin Unfractionated: 0.83 IU/mL — ABNORMAL HIGH (ref 0.30–0.70)

## 2014-06-26 LAB — PROTIME-INR
INR: 1.59 — ABNORMAL HIGH (ref 0.00–1.49)
PROTHROMBIN TIME: 19.1 s — AB (ref 11.6–15.2)

## 2014-06-26 LAB — VALPROIC ACID LEVEL: Valproic Acid Lvl: 36.9 ug/mL — ABNORMAL LOW (ref 50.0–100.0)

## 2014-06-26 LAB — MRSA PCR SCREENING: MRSA BY PCR: NEGATIVE

## 2014-06-26 LAB — APTT
APTT: 49 s — AB (ref 24–37)
aPTT: 88 seconds — ABNORMAL HIGH (ref 24–37)

## 2014-06-26 MED ORDER — RESOURCE THICKENUP CLEAR PO POWD
ORAL | Status: DC | PRN
  Filled 2014-06-26: qty 125

## 2014-06-26 MED ORDER — HEPARIN (PORCINE) IN NACL 100-0.45 UNIT/ML-% IJ SOLN
800.0000 [IU]/h | INTRAMUSCULAR | Status: DC
  Administered 2014-06-26: 800 [IU]/h via INTRAVENOUS
  Filled 2014-06-26: qty 250

## 2014-06-26 MED ORDER — CETYLPYRIDINIUM CHLORIDE 0.05 % MT LIQD
7.0000 mL | Freq: Two times a day (BID) | OROMUCOSAL | Status: DC
  Administered 2014-06-26 – 2014-06-27 (×2): 7 mL via OROMUCOSAL

## 2014-06-26 MED ORDER — CHLORHEXIDINE GLUCONATE 0.12 % MT SOLN
15.0000 mL | Freq: Two times a day (BID) | OROMUCOSAL | Status: DC
  Administered 2014-06-27: 15 mL via OROMUCOSAL
  Filled 2014-06-26 (×6): qty 15

## 2014-06-26 NOTE — Progress Notes (Signed)
ANTICOAGULATION CONSULT NOTE - Initial Consult  Pharmacy Consult for Heparin Indication: Hx of DVT; currently off Xarelto   Allergies  Allergen Reactions  . Ativan [Lorazepam]     Pt has paradoxical effect with sedating medications.     Patient Measurements: Height: 5\' 9"  (175.3 cm) Weight: 127 lb 12.8 oz (57.97 kg) IBW/kg (Calculated) : 70.7   Vital Signs: Temp: 98.2 F (36.8 C) (03/02 0513) Temp Source: Oral (03/02 0513) BP: 104/65 mmHg (03/02 0513) Pulse Rate: 82 (03/02 0513)  Labs:  Recent Labs  06/25/14 1822 06/26/14 0542  HGB 14.3 12.8*  HCT 44.0 39.5  PLT 138* 116*  APTT  --  49*  HEPARINUNFRC  --  1.48*  CREATININE 1.45* 1.20    Estimated Creatinine Clearance: 63.8 mL/min (by C-G formula based on Cr of 1.2).   Medical History: Past Medical History  Diagnosis Date  . TBI (traumatic brain injury)     07/2013, fall from standing post seizure  . Seizure   . DVT (deep venous thrombosis)     UE, LE DVT during hospitalization  . H/O renal calculi   . Cirrhosis   . Depression   . GERD (gastroesophageal reflux disease)     Medications:  Scheduled:  . metoprolol  2.5 mg Intravenous Q12H  . valproate sodium  500 mg Intravenous Q12H   Infusions:  . dextrose 5 % and 0.9% NaCl 75 mL/hr at 06/26/14 0015  . dextrose 5 % and 0.9% NaCl    . heparin     PRN: acetaminophen **OR** acetaminophen, morphine injection, ondansetron **OR** ondansetron (ZOFRAN) IV  Assessment: 44 y/o M with history of TBI, seizures, DVT, brought to ED on 06/25/14 with increasing difficulty with swallowing.  Patient was admitted in September for acute respiratory failure requiring intubation and tracheostomy placement.    Patient was on Xarelto 20 mg daily PTA for hx of UE and LE DVT, reportedly last taken 06/25/14 at 0900, but held on admission due to swallowing difficulty.  Baseline anti-Xa level is elevated, consistent with recent Xarelto administration, but test is not calibrated to  rivaroxaban and therefore cannot be used to quantitate degree of anticoagulation with this drug.    Orders were received to begin heparin infusion while off Xarelto, with pharmacy dosing assistance.   Goal of Therapy:  APTT 66-102 until anti-Xa level and APTT correlate Heparin (anti-Xa) level 0.3 - 0.7 after effect from Xarelto diminishes sufficiently. Monitor platelets by anticoagulation protocol: Yes   Plan:  1. At 0900 today, begin heparin at 800 units/hr (lower than nomogram recommendation, due to some Xarelto effect remaining) 2. Check APTT and anti-Xa level at 4PM  3. APTT, anti-Xa level, and CBC daily. 4. May stop APTT monitoring once these values correlate with the anti-Xa values. 5. Follow clinical course.  Elie Goodyandy Asha Grumbine, PharmD, BCPS Pager: [phone] 06/26/2014  8:01 AM

## 2014-06-26 NOTE — Progress Notes (Addendum)
[name], male   DOB: [birth year], 44 y.o.   MRN: [id number] [facility] PROGRESS NOTE  Hector [name] [id number] DOB: [birth year] DOA: 06/25/2014 PCP: Mariea ClontsFOSTER, CHRISTOPHER B, NP  Brief narrative:    44 y.o. male with history of traumatic brain injury, DVT on anticoagulation with xarelto, seizures who had a hospitalization in September of last year for acute respiratory failure, has required intubation and tracheostomy as well as PEG tube placement. Per patient, patient was eating well recently until about 24 hour prior to this admission he started to have choking episodes and he complained of difficulty swallowing. On admission, patient was found to have acute renal failure, creatinine 1.45. His CT scan did not show acute intracranial findings. His by mouth meds were put on hold. Swallow evaluation requested and pending at this time.  Assessment/Plan:    Principal Problem:   Dysphagia - PO meds on hold except for metoprolol and valproate which were switched to IV regimen - Swallow evaluation pending. Once swallowing evaluation completed will see if further evaluation requires a GI consultation.  Active Problems:   TBI (traumatic brain injury) -  Stable    Dvt femoral (deep venous thrombosis) - On anticoagulation with xarelto which is on hold and pt now on heparin drip - Monitor platelet count.    Essential hypertension - Currently on IV metoprolol.    AKI (acute kidney injury) - Likely secondary to dehydration - Continue IV fluids until we have more information about diet by mouth or if he needs feeding tube.    Seizures - Continue IV Depakote    Thrombocytopenia - Likely secondary to heparin. Platelets are 116 this morning. - Monitor CBC daily.   DVT Prophylaxis  - on heparin drip   Code Status: Full.  Family Communication:  plan of care discussed with the patient Disposition Plan: needs swallow eval today. Not stable for discharge at this time    IV access:  Peripheral IV  Procedures and diagnostic studies:    Dg Chest 2 View 06/25/2014   No acute cardiopulmonary disease. Bilateral basilar atelectasis and/or scarring.   Electronically Signed   By: Andreas NewportGeoffrey  Lamke M.D.   On: 06/25/2014 20:01   Ct Head Wo Contrast 06/26/2014  There has been further atrophy involving the left parietal and occipital lobes. Enlargement of the left lateral ventricle is likely secondary to ex vacuo dilatation.   Electronically Signed   By: Jolaine ClickArthur  Hoss M.D.   On: 06/26/2014 10:22   Medical Consultants:  None   Other Consultants:  SLP  IAnti-Infectives:   None     Manson PasseyEVINE, Vegas Fritze, MD  [facility] Pager [phone]  If 7PM-7AM, please contact night-coverage [email or web address] Password [email or web address] 06/26/2014, 11:13 AM   LOS: 1 day    HPI/Subjective: No acute overnight events.  Objective: Filed Vitals:   06/25/14 2242 06/25/14 2301 06/25/14 2307 06/26/14 0513  BP: 114/70  122/82 104/65  Pulse: 82  78 82  Temp: 97.8 F (36.6 C)  98.4 F (36.9 C) 98.2 F (36.8 C)  TempSrc: Oral  Oral Oral  Resp: 18  18 18   Height:  5\' 9"  (1.753 m)    Weight:  57.97 kg (127 lb 12.8 oz)    SpO2: 95%  96% 95%    Intake/Output Summary (Last 24 hours) at 06/26/14 1113 Last data filed at 06/26/14 0834  Gross per 24 hour  Intake      0 ml  Output    500 ml  Net   -  500 ml    Exam:   General:  Pt is alert, follows commands appropriately, not in acute distress  Cardiovascular: Regular rate and rhythm, S1/S2, no murmurs  Respiratory: Clear to auscultation bilaterally, no wheezing, no crackles, no rhonchi  Abdomen: Soft, non tender, non distended, bowel sounds present  Extremities: No edema, pulses DP and PT palpable bilaterally  Neuro: Grossly nonfocal  Data Reviewed: Basic Metabolic Panel:  Recent Labs Lab 06/25/14 1822 06/26/14 0542  NA 145 143  K 3.9 4.0  CL 103 107  CO2 28 31  GLUCOSE 131* 126*  BUN 34* 30*  CREATININE 1.45* 1.20  CALCIUM  10.2 9.3   Liver Function Tests:  Recent Labs Lab 06/25/14 1822 06/26/14 0542  AST 31 23  ALT 22 19  ALKPHOS 122* 109  BILITOT 0.6 0.9  PROT 9.0* 7.7  ALBUMIN 4.6 4.0    Recent Labs Lab 06/25/14 1822  LIPASE 22   No results for input(s): AMMONIA in the last 168 hours. CBC:  Recent Labs Lab 06/25/14 1822 06/26/14 0542  WBC 10.3 9.7  NEUTROABS 6.6 6.6  HGB 14.3 12.8*  HCT 44.0 39.5  MCV 97.3 97.8  PLT 138* 116*   Cardiac Enzymes: No results for input(s): CKTOTAL, CKMB, CKMBINDEX, TROPONINI in the last 168 hours. BNP: Invalid input(s): POCBNP CBG:  Recent Labs Lab 06/26/14 0610  GLUCAP 120*    Recent Results (from the past 240 hour(s))  MRSA PCR Screening     Status: None   Collection Time: 06/25/14 11:36 PM  Result Value Ref Range Status   MRSA by PCR NEGATIVE NEGATIVE Final     Scheduled Meds: . metoprolol  2.5 mg Intravenous Q12H  . valproate sodium  500 mg Intravenous Q12H   Continuous Infusions: . dextrose 5 % and 0.9% NaCl 75 mL/hr at 06/26/14 0015  . dextrose 5 % and 0.9% NaCl    . heparin 800 Units/hr (06/26/14 0841)

## 2014-06-26 NOTE — Progress Notes (Signed)
MBS completed, full report to follow.  Pt presents with mild oral and moderate pharyngeal dysphagia with + aspiration of secretions and thin liquids via cup.  Pt takes large boluses and demonstrates decreased oral control resulting in premature spillage of barium into pharynx uncontrolled.  No aspiration or deep laryngeal penetration of nectar, honey, pudding or cracker.  Pharyngeal residuals noted across consistencies with inconsistent awareness.  Cued dry swallows effectively decreased residuals.    Recommend initiate:  Modified diet to dys3/ground meat/nectar. Allow tsps of thin between meals. Throat clear  Dry swallows   Donavan Burnetamara Vishaal Strollo, MS [facility] SLP [phone]

## 2014-06-26 NOTE — Clinical Documentation Improvement (Signed)
Platelets as below.  Please identify any clinical conditions associated with the abnormal lab values and document in your progress note and discharge summary.    Component Platelets  Latest Ref Rng 150 - 400 K/uL  06/25/2014 138 (L)  06/26/2014 116 (L)   Possible Clinical Conditions: -thrombocytopenia -other condition (please specify) -unable to determine at present  Thank you, Doy MinceVangela Maverick Dieudonne, RN [phone] Clinical Documentation Specialist

## 2014-06-26 NOTE — Progress Notes (Signed)
UR complete 

## 2014-06-26 NOTE — Progress Notes (Signed)
ANTICOAGULATION CONSULT NOTE - Follow Up Consult  Pharmacy Consult for Heparin Indication: Hx of DVT; currently off Xarelto  Allergies  Allergen Reactions  . Ativan [Lorazepam]     Pt has paradoxical effect with sedating medications.     Patient Measurements: Height: 5\' 9"  (175.3 cm) Weight: 127 lb 12.8 oz (57.97 kg) IBW/kg (Calculated) : 70.7 Heparin Dosing Weight: 70.7 kg  Vital Signs: Temp: 97.9 F (36.6 C) (03/02 1256) Temp Source: Oral (03/02 1256) BP: 107/73 mmHg (03/02 1256) Pulse Rate: 72 (03/02 1256)  Labs:  Recent Labs  06/25/14 1822 06/26/14 0542 06/26/14 1600  HGB 14.3 12.8*  --   HCT 44.0 39.5  --   PLT 138* 116*  --   APTT  --  49*  --   LABPROT  --  19.1*  --   INR  --  1.59*  --   HEPARINUNFRC  --  1.48* 0.83*  CREATININE 1.45* 1.20  --     Estimated Creatinine Clearance: 63.8 mL/min (by C-G formula based on Cr of 1.2).   Medications:  Scheduled:  . metoprolol  2.5 mg Intravenous Q12H  . valproate sodium  500 mg Intravenous Q12H   Infusions:  . dextrose 5 % and 0.9% NaCl 75 mL/hr at 06/26/14 1638  . dextrose 5 % and 0.9% NaCl    . heparin 800 Units/hr (06/26/14 0841)   PRN: acetaminophen **OR** acetaminophen, morphine injection, ondansetron **OR** ondansetron (ZOFRAN) IV, RESOURCE THICKENUP CLEAR  Assessment: 44 y/o M with history of TBI, seizures, DVT, brought to ED on 06/25/14 with increasing difficulty with swallowing. Patient was admitted in September for acute respiratory failure requiring intubation and tracheostomy placement.   Patient was on Xarelto 20 mg daily PTA for hx of UE and LE DVT, reportedly last taken 06/25/14 at 0900, but held on admission due to swallowing difficulty. Baseline anti-Xa level is elevated, consistent with recent Xarelto administration, but test is not calibrated to rivaroxaban and therefore cannot be used to quantitate degree of anticoagulation with this drug.   Orders were received to begin heparin  infusion while off Xarelto, with pharmacy dosing assistance.  Today, 06/26/2014  Heparin 800 units/hr started at ~08:30 this AM without a bolus  Anti-Xa level supratherapeutic (0.83 units/ml) as expected from residual effects from Xarelto  APTT is therapeutic (88 seconds)  No bleeding or complications with IV reported per RN  Goal of Therapy:  APTT 66-102 seconds until anti-Xa level and APTT correlate Heparin level 0.3-0.7 units/ml after effect from Xarelto diminishes sufficiently Monitor platelets by anticoagulation protocol: Yes   Plan:   Continue heparin 800 units/hr  Recheck APTT and anti-Xa level in 6hrs  APTT, anti-Xa level, and CBC daily  May stop APTT monitoring once these values correlate with the anti-Xa values  Follow clinical course  Loralee PacasErin Lucy Woolever, PharmD, BCPS Pager: [phone] 06/26/2014,5:12 PM

## 2014-06-26 NOTE — Evaluation (Signed)
Clinical/Bedside Swallow Evaluation Patient Details  Name: Hector [name] MRN: [id number] Date of Birth: 16-Oct-1970  Today's Date: 06/26/2014 Time: SLP Start Time (ACUTE ONLY): 0910 SLP Stop Time (ACUTE ONLY): 0930 SLP Time Calculation (min) (ACUTE ONLY): 20 min  Past Medical History:  Past Medical History  Diagnosis Date  . TBI (traumatic brain injury)     07/2013, fall from standing post seizure  . Seizure   . DVT (deep venous thrombosis)     UE, LE DVT during hospitalization  . H/O renal calculi   . Cirrhosis   . Depression   . GERD (gastroesophageal reflux disease)    Past Surgical History:  Past Surgical History  Procedure Laterality Date  . Tracheostomy      feinstein  . Gastrostomy tube placement    . Removal of gastrostomy tube     HPI:  Hector [name] is a 44 y.o. male with history of traumatic brain injury, DVT, seizures was brought to the ER after patient was found to have increasing difficulty swallowing since morning on day of admit.  Patient was admitted in September for acute respiratory failure and at that time patient was intubated and eventually had tracheostomy and also was placed on PEG tube. Patient's father states that patient has been eating well recently but today started having some choking episodes. Patient is finding it difficult to swallow. In the ER labs so mild worsening of his renal function and has been admitted for further management of his dysphagia and dehydration per MD review.  Pt previously had a trach and PEG placed in fall 2015 - since removed.  Went to rehab at [facility] and was dc'd home with family.  Swallow evaluation ordered.     Assessment / Plan / Recommendation Clinical Impression  Pt presents with indications of pharyngeal dysphagia and concern for possible airway compromise with intake.  Multiple swallows noted across consistencies with immediate and delayed throat clearing.  Pt pointed to throat after swallow - ? indicating  residue, discomfort?  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   Pt is aphasic and apraxic and thus clinical signs are useful for assessment.   Note CT head completed, results not back yet.  Instrumental evaluation indicated due to h/o dysphagia requiring PEG and current signs.      Aspiration Risk    moderately severe    Diet Recommendation NPO   Medication Administration: Via alternative means    Other  Recommendations Recommended Consults: MBS   Follow Up Recommendations       Frequency and Duration     (tbd)   Pertinent Vitals/Pain Afebrile, decreased      Swallow Study Prior Functional Status   see HHX     General Date of Onset: 06/26/14 HPI: Hector NianRobert W [name] is a 44 y.o. male with history of traumatic brain injury, DVT, seizures was brought to the ER after patient was found to have increasing difficulty swallowing since morning on day of admit.  Patient was admitted in September for acute respiratory failure and at that time patient was intubated and eventually had tracheostomy and also was placed on PEG tube. Patient's father states that patient has been eating well recently but today started having some choking episodes. Patient is finding it difficult to swallow. In the ER labs so mild worsening of his renal function and has been admitted for further management of his dysphagia and dehydration per MD review.  Pt previously had a trach and  PEG placed in fall 2015 - since removed.  Went to rehab at [facility] and was dc'd home with family.  Swallow evaluation ordered.   Type of Study: Bedside swallow evaluation Previous Swallow Assessment: none in EPIC Diet Prior to this Study: NPO Temperature Spikes Noted: No Respiratory Status: Room air History of Recent Intubation: No Behavior/Cognition: Alert;Cooperative;Pleasant mood Oral Cavity - Dentition: Adequate natural dentition Self-Feeding Abilities: Able to feed self Patient Positioning:  Upright in bed Baseline Vocal Quality: Clear Volitional Cough: Weak;Other (Comment) (motor planning deficits impact ability to conduct) Volitional Swallow: Unable to elicit    Oral/Motor/Sensory Function Overall Oral Motor/Sensory Function:  (lingual deviation to right upon protrusion, otherwise unremarkable)   Ice Chips Ice chips: Not tested   Thin Liquid Thin Liquid: Impaired Presentation: Straw;Self Fed Pharyngeal  Phase Impairments: Multiple swallows;Throat Clearing - Delayed;Throat Clearing - Immediate    Nectar Thick Nectar Thick Liquid: Not tested   Honey Thick Honey Thick Liquid: Not tested   Puree Puree: Impaired Presentation: Self Fed Pharyngeal Phase Impairments: Multiple swallows;Throat Clearing - Delayed   Solid   GO    Solid: Not tested       Mills Koller, MS [facility] SLP [phone]

## 2014-06-27 LAB — BASIC METABOLIC PANEL
ANION GAP: 4 — AB (ref 5–15)
BUN: 21 mg/dL (ref 6–23)
CO2: 27 mmol/L (ref 19–32)
Calcium: 9.1 mg/dL (ref 8.4–10.5)
Chloride: 111 mmol/L (ref 96–112)
Creatinine, Ser: 0.98 mg/dL (ref 0.50–1.35)
GFR calc non Af Amer: >90 mL/min (ref >90)
Glucose, Bld: 114 mg/dL — ABNORMAL HIGH (ref 70–99)
Potassium: 4.2 mmol/L (ref 3.5–5.1)
Sodium: 142 mmol/L (ref 135–145)

## 2014-06-27 LAB — CBC
HCT: 38.3 % — ABNORMAL LOW (ref 39.0–52.0)
Hemoglobin: 12.3 g/dL — ABNORMAL LOW (ref 13.0–17.0)
MCH: 31.7 pg (ref 26.0–34.0)
MCHC: 32.1 g/dL (ref 30.0–36.0)
MCV: 98.7 fL (ref 78.0–100.0)
Platelets: 107 10*3/uL — ABNORMAL LOW (ref 150–400)
RBC: 3.88 MIL/uL — AB (ref 4.22–5.81)
RDW: 17.4 % — ABNORMAL HIGH (ref 11.5–15.5)
WBC: 5.7 10*3/uL (ref 4.0–10.5)

## 2014-06-27 LAB — APTT
APTT: 141 s — AB (ref 24–37)
APTT: 97 s — AB (ref 24–37)

## 2014-06-27 LAB — GLUCOSE, CAPILLARY
Glucose-Capillary: 101 mg/dL — ABNORMAL HIGH (ref 70–99)
Glucose-Capillary: 102 mg/dL — ABNORMAL HIGH (ref 70–99)
Glucose-Capillary: 88 mg/dL (ref 70–99)

## 2014-06-27 LAB — HEPARIN LEVEL (UNFRACTIONATED): Heparin Unfractionated: 0.48 IU/mL (ref 0.30–0.70)

## 2014-06-27 MED ORDER — HEPARIN (PORCINE) IN NACL 100-0.45 UNIT/ML-% IJ SOLN
750.0000 [IU]/h | INTRAMUSCULAR | Status: AC
  Administered 2014-06-27: 750 [IU]/h via INTRAVENOUS
  Filled 2014-06-27 (×2): qty 250

## 2014-06-27 MED ORDER — RESOURCE THICKENUP CLEAR PO POWD: 1.0000 | ORAL | Status: DC | PRN

## 2014-06-27 MED ORDER — RIVAROXABAN 20 MG PO TABS
20.0000 mg | ORAL_TABLET | Freq: Every day | ORAL | Status: DC
  Administered 2014-06-27: 20 mg via ORAL
  Filled 2014-06-27 (×2): qty 1

## 2014-06-27 MED ORDER — CHLORHEXIDINE GLUCONATE 0.12 % MT SOLN: 15.0000 mL | Freq: Two times a day (BID) | OROMUCOSAL | Status: DC

## 2014-06-27 MED ORDER — CETYLPYRIDINIUM CHLORIDE 0.05 % MT LIQD
7.0000 mL | Freq: Two times a day (BID) | OROMUCOSAL | Status: DC
Start: 2014-06-27 — End: 2014-11-22

## 2014-06-27 NOTE — Discharge Instructions (Signed)
Dysphagia Level 3 Diet, Mechanically Advanced °The dysphagia level 3 diet includes foods that are soft, moist, and can be chopped into 1-inch chunks. This diet is helpful for people with mild swallowing difficulties. It reduces the risk of food getting caught in the windpipe, trachea, or lungs. °WHAT DO I NEED TO KNOW ABOUT THIS DIET? °· You may eat foods that are soft and moist. °· If you were on the dysphagia level 1 or level 2 diets, you may eat any of the foods included on those lists. °· Avoid foods that are dry, hard, sticky, chewy, coarse, and crunchy. Also avoid large cuts of food. °· Take small bites. Each bite should contain 1 inch or less of food. °· Thicken liquids if instructed by your health care provider. Follow your health care provider's instructions on how to do this and to what consistency. °· See your dietitian or speech language pathologist regularly for help with your dietary changes. °WHAT FOODS CAN I EAT? °Grains °Moist breads without nuts or seeds. Biscuits, muffins, pancakes, and waffles well-moistened with syrup, jelly, margarine, or butter. Smooth cereals with plenty of milk to moisten them. Moist bread stuffing. Moist rice. °Vegetables °All cooked, soft vegetables. Shredded lettuce. Tender fried potatoes. °Fruits °All canned and cooked fruits. Soft, peeled fresh fruits, such as peaches, nectarines, kiwis, cantaloupe, honeydew melon, and watermelon without seeds. Soft berries, such as strawberries. °Meat and Other Protein Sources °Moist ground or finely diced or sliced meats. Solid, tender cuts of meat. Meatloaf. Hamburger with a bun. Sausage patty. Deli thin-sliced lunch meat. Chicken, egg, or tuna salad sandwich. Sloppy joe. Moist fish. Eggs prepared any way. Casseroles with small chunks of meats, ground meats, or tender meats. °Dairy °Cheese spreads without coarse large chunks. Shredded cheese. Cheese slices. Cottage cheese. Milk at the right texture. Smooth frappes. Yogurt without  nuts or coconut. Ask your health care provider whether you can have frozen desserts (such as malts or milk shakes) and thin liquids. °Sweets/Desserts °Soft, smooth, moist desserts. Non-chewy, smooth candy. Jam. Jelly. Honey. Preserves. Ask your health care provider whether you can have frozen desserts. °Fats and Oils °Butter. Oils. Margarine. Mayonnaise. Gravy. Spreads. °Other °All seasonings and sweeteners. All sauces without large chunks. °The items listed above may not be a complete list of recommended foods or beverages. Contact your dietitian for more options. °WHAT FOODS ARE NOT RECOMMENDED? °Grains °Coarse or dry cereals. Dry breads. Toast. Crackers. Tough, crusty breads, such French bread and baguettes. Tough, crisp fried potatoes. Potato skins. Dry bread stuffing. Granola. Popcorn. Chips. °Vegetables °All raw vegetables except shredded lettuce. Cooked corn. Rubbery or stiff cooked vegetables. Stringy vegetables, such as celery. °Fruits °Hard fruits that are difficult to chew, such as apples or pears. Stringy, high-pulp fruits, such as pineapple, papaya, or mango. Fruits with tough skins, such as grapes. Coconut. All dried fruits. Fruit leather. Fruit roll-ups. Fruit snacks. °Meat and Other Protein Sources °Dry or tough meats or poultry. Dry fish. Fish with bones. Peanut butter. All nuts and seeds. °Dairy  °Any with nuts, seeds, chocolate chips, dried fruit, coconut, or pineapple. °Sweets/Desserts °Dry cakes. Chewy or dry cookies. Any with nuts, seeds, dry fruits, coconut, pineapple, or anything dry, sticky, or hard. Chewy caramel. Licorice. Taffy-type candies. Ask your health care provider whether you can have frozen desserts. °Fats and Oils °Any with chunks, nuts, seeds, or pineapple. Olives. Pickles. °Other °Soups with tough or large chunks of meats, poultry, or vegetables. Corn or clam chowder. °The items listed above may not be   a complete list of foods and beverages to avoid. Contact your dietitian for  more information. °Document Released: 04/12/2005 Document Revised: 12/13/2012 Document Reviewed: 03/26/2013 °[employer]® Patient Information ©2015 [employer]. This information is not intended to replace advice given to you by your health care provider. Make sure you discuss any questions you have with your health care provider. ° °

## 2014-06-27 NOTE — Discharge Summary (Signed)
Physician Discharge Summary  Collin Rengel Kulik ZOX:[id number] DOB: 09/09/1970 DOA: 06/25/2014  PCP: Mariea Clonts, NP  Admit date: 06/25/2014 Discharge date: 06/27/2014  Recommendations for Outpatient Follow-up:  Diet: Modified diet to dys3/ground meat/nectar. Allow tsps of thin between meals. Throat clear  Dry swallows    Discharge instructions    Complete by:  As directed  1. Patient may resume home medications on discharge. 2. Patient can continue taking xarelto as per prior to the admission.  3. Platelet count is 107 prior to discharge. Please continue to monitor on outpatient basis to make sure it remains stable. No reports of bleeding throughout the hospital stay. 4. Kidney function is normal prior to discharge.       Discharge Diagnoses:  Principal Problem:   Dysphagia Active Problems:   TBI (traumatic brain injury)   Dvt femoral (deep venous thrombosis)   AKI (acute kidney injury)    Discharge Condition: stable   Diet recommendation: as tolerated   History of present illness:  44 y.o. male with history of traumatic brain injury, DVT on anticoagulation with xarelto, seizures who had a hospitalization in September of last year for acute respiratory failure, has required intubation and tracheostomy as well as PEG tube placement. Per patient, patient was eating well recently until about 24 hour prior to this admission he started to have choking episodes and he complained of difficulty swallowing. On admission, patient was found to have acute renal failure, creatinine 1.45. His CT scan did not show acute intracranial findings. His by mouth meds were put on hold. Swallow evaluation requested and has recommended dysphagia 3 diet. Please see above diet recommendation from swallow evaluation.  Assessment/Plan:    Principal Problem:  Dysphagia - PO meds on hold except for metoprolol and valproate which were switched to IV regimen - Patient had swallow evaluation. The  diet recommendation is for dysphasia 3 / ground meat / nectar with tsps of thins between the meals - May resume by mouth medications on discharge.  Active Problems:  TBI (traumatic brain injury) - Stable   Dvt femoral (deep venous thrombosis) - On anticoagulation with xarelto. He will continue this on discharge. Patient was on heparin drip in hospital. - Instruction provided to follow-up platelet count on discharge with primary care physician.  - No reports of bleeding.    Essential hypertension -  resume home medications on discharge.    AKI (acute kidney injury) - Likely secondary to dehydration - creatinine improved with IV fluids. Creatinine is within normal limits prior to discharge.    Seizures - continue Depakote    Thrombocytopenia - Likely secondary to heparin. Platelets are 107 this morning. Instruction provided to monitor platelet count on outpatient basis with primary care physician.    DVT Prophylaxis  - on heparin drip while patient is in hospital. Patient will continue xarelto on discharge   Code Status: Full.  Family Communication: plan of care discussed with the patient   IV access:  Peripheral IV  Procedures and diagnostic studies:   Dg Chest 2 View 06/25/2014 No acute cardiopulmonary disease. Bilateral basilar atelectasis and/or scarring. Electronically Signed By: Andreas Newport M.D. On: 06/25/2014 20:01   Ct Head Wo Contrast 06/26/2014 There has been further atrophy involving the left parietal and occipital lobes. Enlargement of the left lateral ventricle is likely secondary to ex vacuo dilatation. Electronically Signed By: Jolaine Click M.D. On: 06/26/2014 10:22   Medical Consultants:  None   Other Consultants:  SLP  IAnti-Infectives:  None     Signed:  Manson Passey, MD  [facility] 06/27/2014, 9:26 AM  Pager #: [phone]   Discharge Exam: Filed Vitals:   06/27/14 0520  BP: 97/63   Pulse: 73  Temp: 98.4 F (36.9 C)  Resp: 16   Filed Vitals:   06/26/14 1256 06/26/14 2101 06/27/14 0500 06/27/14 0520  BP: 107/73 112/66  97/63  Pulse: 72 84  73  Temp: 97.9 F (36.6 C) 97.9 F (36.6 C)  98.4 F (36.9 C)  TempSrc: Oral Oral  Oral  Resp: 20 16  16   Height:      Weight:   58.06 kg (128 lb)   SpO2: 96% 97%  97%    General: Pt is alert, follows commands appropriately, not in acute distress Cardiovascular: Regular rate and rhythm, S1/S2 +, no murmurs Respiratory: Clear to auscultation bilaterally, no wheezing, no crackles, no rhonchi Abdominal: Soft, non tender, non distended, bowel sounds +, no guarding Extremities: no edema, no cyanosis, pulses palpable bilaterally DP and PT Neuro: Grossly nonfocal  Discharge Instructions  Discharge Instructions    Call MD for:  difficulty breathing, headache or visual disturbances    Complete by:  As directed      Call MD for:  persistant nausea and vomiting    Complete by:  As directed      Call MD for:  severe uncontrolled pain    Complete by:  As directed      Diet - low sodium heart healthy    Complete by:  As directed   Modified diet to dys3/ground meat/nectar. Allow tsps of thin between meals. Throat clear  Dry swallows     Discharge instructions    Complete by:  As directed   1. Patient may resume home medications on discharge. 2. Patient can continue taking xarelto as per prior to the admission.  3. Platelet count is 107 prior to discharge. Please continue to monitor on outpatient basis to make sure it remains stable. No reports of bleeding throughout the hospital stay. 4. Kidney function is normal prior to discharge.     Increase activity slowly    Complete by:  As directed             Medication List    STOP taking these medications        mirtazapine 15 MG tablet  Commonly known as:  REMERON      TAKE these medications        amantadine 100 MG capsule  Commonly known as:  SYMMETREL  Take  100 mg by mouth 2 (two) times daily.     antiseptic oral rinse 0.05 % Liqd solution  Commonly known as:  CPC / CETYLPYRIDINIUM CHLORIDE 0.05%  7 mLs by Mouth Rinse route 2 times daily at 12 noon and 4 pm.     bromocriptine 2.5 MG tablet  Commonly known as:  PARLODEL  Take 5 mg by mouth 2 (two) times daily.     chlorhexidine 0.12 % solution  Commonly known as:  PERIDEX  15 mLs by Mouth Rinse route 2 (two) times daily.     clonazePAM 0.5 MG tablet  Commonly known as:  KLONOPIN  Take 0.5 mg by mouth 2 (two) times daily as needed for anxiety.     divalproex 500 MG 24 hr tablet  Commonly known as:  DEPAKOTE ER  Take 1 tablet (500 mg total) by mouth 2 (two) times daily.     docusate sodium 100 MG capsule  Commonly known as:  COLACE  Take 100 mg by mouth 2 (two) times daily.     escitalopram 10 MG tablet  Commonly known as:  LEXAPRO  Take 10 mg by mouth daily.     furosemide 20 MG tablet  Commonly known as:  LASIX  Take 20 mg by mouth daily.     magnesium hydroxide 400 MG/5ML suspension  Commonly known as:  MILK OF MAGNESIA  Take 30 mLs by mouth daily as needed for mild constipation.     metoprolol tartrate 25 MG tablet  Commonly known as:  LOPRESSOR  Take 12.5 mg by mouth 2 (two) times daily.     ondansetron 4 MG tablet  Commonly known as:  ZOFRAN  Take 1 tablet (4 mg total) by mouth every 6 (six) hours.     oxyCODONE-acetaminophen 5-325 MG per tablet  Commonly known as:  PERCOCET/ROXICET  Take 1-2 tablets by mouth every 6 (six) hours as needed for moderate pain or severe pain.     pantoprazole 40 MG tablet  Commonly known as:  PROTONIX  Take 40 mg by mouth daily.     polyethylene glycol packet  Commonly known as:  MIRALAX / GLYCOLAX  Take 17 g by mouth daily as needed for mild constipation.     QUEtiapine 100 MG tablet  Commonly known as:  SEROQUEL  Take 1 tablet (100 mg total) by mouth 3 (three) times daily.     RESOURCE THICKENUP CLEAR Powd  Take 120 g by  mouth as needed.     rivaroxaban 20 MG Tabs tablet  Commonly known as:  XARELTO  Take 20 mg by mouth daily.     spironolactone 50 MG tablet  Commonly known as:  ALDACTONE  Take 50 mg by mouth daily.     Valproic Acid 250 MG/5ML Syrp syrup  Commonly known as:  DEPAKENE  Place 10 mLs (500 mg total) into feeding tube every 12 (twelve) hours.           Follow-up Information    Follow up with FOSTER, CHRISTOPHER B, NP. Schedule an appointment as soon as possible for a visit in 1 week.   Specialty:  Nurse Practitioner   Why:  Follow up appt after recent hospitalization   Contact information:   [address] [place] [address] [phone]        The results of significant diagnostics from this hospitalization (including imaging, microbiology, ancillary and laboratory) are listed below for reference.    Significant Diagnostic Studies: Dg Chest 2 View  06/25/2014   CLINICAL DATA:  Wheezing and abdominal pain today. Abnormal breath sounds.  EXAM: CHEST  2 VIEW  COMPARISON:  01/02/2014.  FINDINGS: Cardiopericardial silhouette and mediastinal contours are within normal limits, unchanged from prior. The tracheostomy is no longer present. Basilar subsegmental atelectasis and pulmonary parenchymal scarring is present. On the lateral view, there is increased density over the lower lobes which is favored to represent atelectasis based on the frontal view. Infection or aspiration are considered less likely. The appearance of the chest is little changed comparing to prior frontal views.  IMPRESSION: No acute cardiopulmonary disease. Bilateral basilar atelectasis and/or scarring.   Electronically Signed   By: Andreas Newport M.D.   On: 06/25/2014 20:01   Ct Head Wo Contrast  06/26/2014   CLINICAL DATA:  Traumatic brain injury.  Seizure.  EXAM: CT HEAD WITHOUT CONTRAST  TECHNIQUE: Contiguous axial images were obtained from the base of the skull through the  vertex without intravenous  contrast.  COMPARISON:  12/20/2013  FINDINGS: Postoperative changes from left frontal craniotomy are again noted. The left lateral ventricle remains enlarged. There is effacement of the overlying parietal and occipital sulci. This is a stable finding. Atrophy has further progressed involving the left parietal and occipital lobes. Encephalomalacia in the left upper lobe persists. No midline shift. Mild chronic ischemic changes in the periventricular white matter about the frontal horns.  No acute hemorrhage. No new suspicious low-density to suggest acute ischemic change.  No acute fracture. Mastoid air cells are clear. Opacity in the right maxillary sinus persists.  IMPRESSION: There has been further atrophy involving the left parietal and occipital lobes. Enlargement of the left lateral ventricle is likely secondary to ex vacuo dilatation.   Electronically Signed   By: Jolaine Click M.D.   On: 06/26/2014 10:22   Dg Swallowing Func-speech Pathology  06/26/2014    Objective Swallowing Evaluation:    Patient Details  Name: Hector [name] MRN: [id number] Date of Birth: 1970-08-01  Today's Date: 06/26/2014 Time: SLP Start Time (ACUTE ONLY): 1218-SLP Stop Time (ACUTE ONLY): 1235 SLP Time Calculation (min) (ACUTE ONLY): 17 min  Past Medical History:  Past Medical History  Diagnosis Date  . TBI (traumatic brain injury)     07/2013, fall from standing post seizure  . Seizure   . DVT (deep venous thrombosis)     UE, LE DVT during hospitalization  . H/O renal calculi   . Cirrhosis   . Depression   . GERD (gastroesophageal reflux disease)    Past Surgical History:  Past Surgical History  Procedure Laterality Date  . Tracheostomy      feinstein  . Gastrostomy tube placement    . Removal of gastrostomy tube     HPI:  HPI: Hector [name] is a 44 y.o. male with history of traumatic brain  injury, DVT, seizures was brought to the ER after patient was found to  have increasing difficulty swallowing since morning on day of admit.    Patient was admitted in September for acute respiratory failure and at  that time patient was intubated and eventually had tracheostomy and also  was placed on PEG tube. Patient's father states that patient has been  eating well recently but today started having some choking episodes per MD  note. Patient is finding it difficult to swallow. Worsening of his renal  function found via labs and pt has been admitted for further management of  his dysphagia and dehydration per MD review.  Pt previously had a trach  and PEG placed in fall 2015 - since removed.  Went to rehab at [facility]  and was dc'd home with family.  Swallow evaluation ordered.  BSE conducted  with recommendation for MBS.  CT head 06/26/14 indicated further atrophy  involving the left parietal and occipital lobes.    No Data Recorded  Assessment / Plan / Recommendation CHL IP CLINICAL IMPRESSIONS 06/26/2014  Dysphagia Diagnosis Mild oral and moderate pharyngeal dysphagia  Clinical impression Pt presents with mild oral and moderate pharyngeal  dysphagia with + aspiration of secretions and thin liquids via cup. Tsps  of thin tolerated without aspiration.  Pt takes large boluses - which  contributes largely to his aspiration risk.  He demonstrates decreased  oral control resulting in premature spillage of barium into pharynx  uncontrolled. No aspiration or deep laryngeal penetration of nectar,  honey, pudding or cracker. Pharyngeal residuals noted and mix with  secretions  resulting in minimal penetration -ongoing with inconsistent  awareness. Cued dry swallows effectively decreased residuals and throat  clearing decreases penetration. Using live video, SLP educated pt to  findings and reinforced effective compensation strategies.    Recommend to modify diet to dys3/nectar with strict precautions - FULL  SUPERVISION.    Please note, pt chronically aspirating a small amount of secretions  throughout study.        CHL IP TREATMENT RECOMMENDATION 06/26/2014   Treatment Plan Recommendations Therapy as outlined in treatment plan below      CHL IP DIET RECOMMENDATION 06/26/2014  Diet Recommendations Dysphagia 3 (Mechanical Soft);Nectar-thick liquid  Liquid Administration via Cup  Medication Administration Crushed with puree  Compensations Slow rate;Small sips/bites;Multiple dry swallows after each  bite/sip;Clear throat intermittently  Postural Changes and/or Swallow Maneuvers Seated upright 90  degrees;Upright 30-60 min after meal     CHL IP OTHER RECOMMENDATIONS 06/26/2014  Recommended Consults MBS  Oral Care Recommendations Oral care BID  Other Recommendations (None)        CHL IP FREQUENCY AND DURATION 06/26/2014  Speech Therapy Frequency (ACUTE ONLY) min 2x/week  Treatment Duration 2 weeks         CHL IP REASON FOR REFERRAL 06/26/2014  Reason for Referral Objectively evaluate swallowing function     CHL IP ORAL PHASE 06/26/2014                    Oral Phase Impaired                 Oral - Nectar Teaspoon Weak lingual manipulation  Oral - Nectar Cup Weak lingual manipulation           Oral - Thin Teaspoon Weak lingual manipulation  Oral - Thin Cup Weak lingual manipulation        Oral - Puree Weak lingual manipulation  Oral - Mechanical Soft Weak lingual manipulation;Impaired mastication                  CHL IP PHARYNGEAL PHASE 06/26/2014  Pharyngeal Phase Impaired                                Pharyngeal - Nectar Teaspoon Pharyngeal residue - valleculae;Premature  spillage to valleculae;Pharyngeal residue - pyriform sinuses;Reduced  laryngeal elevation;Reduced airway/laryngeal closure  Penetration/Aspiration details (nectar teaspoon) (None)  Pharyngeal - Nectar Cup Pharyngeal residue - valleculae;Premature spillage  to valleculae;Pharyngeal residue - pyriform sinuses;Reduced laryngeal  elevation;Reduced airway/laryngeal closure                       Pharyngeal - Thin Teaspoon Pharyngeal residue - valleculae;Premature  spillage to valleculae;Pharyngeal residue - pyriform  sinuses;Reduced  airway/laryngeal closure;Reduced laryngeal elevation  Penetration/Aspiration details (thin teaspoon) (None)  Pharyngeal - Thin Cup Trace aspiration;Penetration/Aspiration during  swallow;Pharyngeal residue - valleculae;Premature spillage to  valleculae;Pharyngeal residue - pyriform sinuses;Reduced laryngeal  elevation;Reduced airway/laryngeal closure  Penetration/Aspiration details (thin cup) Material enters airway, passes  BELOW cords and not ejected out despite cough attempt by patient              Pharyngeal - Puree Pharyngeal residue - valleculae;Premature spillage to  valleculae;Pharyngeal residue - pyriform sinuses;Reduced airway/laryngeal  closure;Reduced laryngeal elevation;Penetration/Aspiration after swallow  Penetration/Aspiration details (puree) Material enters airway, remains  ABOVE vocal cords and not ejected out  Pharyngeal - Mechanical Soft Pharyngeal residue - valleculae;Premature  spillage to valleculae;Pharyngeal residue - pyriform sinuses;Reduced  airway/laryngeal closure;Reduced laryngeal elevation                       Pharyngeal Comment pt without consistent sensation to pharyngeal residuals  = cued dry swallows effective to decrease     CHL IP CERVICAL ESOPHAGEAL PHASE 06/26/2014  [facility]ophageal Phase [facility]                                                      Donavan Burnetamara Kimball, MS [facility] SLP [phone]     Microbiology: Recent Results (from the past 240 hour(s))  MRSA PCR Screening     Status: None   Collection Time: 06/25/14 11:36 PM  Result Value Ref Range Status   MRSA by PCR NEGATIVE NEGATIVE Final    Comment:        The GeneXpert MRSA Assay (FDA approved for NASAL specimens only), is one component of a comprehensive MRSA colonization surveillance program. It is not intended to diagnose MRSA infection nor to guide or monitor treatment for MRSA infections.      Labs: Basic Metabolic Panel:  Recent Labs Lab 06/25/14 1822 06/26/14 0542 06/27/14 0543   NA 145 143 142  K 3.9 4.0 4.2  CL 103 107 111  CO2 28 31 27   GLUCOSE 131* 126* 114*  BUN 34* 30* 21  CREATININE 1.45* 1.20 0.98  CALCIUM 10.2 9.3 9.1   Liver Function Tests:  Recent Labs Lab 06/25/14 1822 06/26/14 0542  AST 31 23  ALT 22 19  ALKPHOS 122* 109  BILITOT 0.6 0.9  PROT 9.0* 7.7  ALBUMIN 4.6 4.0    Recent Labs Lab 06/25/14 1822  LIPASE 22   No results for input(s): AMMONIA in the last 168 hours. CBC:  Recent Labs Lab 06/25/14 1822 06/26/14 0542 06/27/14 0543  WBC 10.3 9.7 5.7  NEUTROABS 6.6 6.6  --   HGB 14.3 12.8* 12.3*  HCT 44.0 39.5 38.3*  MCV 97.3 97.8 98.7  PLT 138* 116* 107*   Cardiac Enzymes: No results for input(s): CKTOTAL, CKMB, CKMBINDEX, TROPONINI in the last 168 hours. BNP: BNP (last 3 results) No results for input(s): BNP in the last 8760 hours.  ProBNP (last 3 results)  Recent Labs  12/13/13 1114  PROBNP 729.4*    CBG:  Recent Labs Lab 06/26/14 0610 06/26/14 1125 06/26/14 1648 06/27/14 0025 06/27/14 0525  GLUCAP 120* 116* 103* 102* 101*    Time coordinating discharge: Over 30 minutes

## 2014-06-27 NOTE — Progress Notes (Signed)
ANTICOAGULATION CONSULT NOTE - Follow Up Consult  Pharmacy Consult for Heparin (transition back to xarelto) Indication: Hx of DVT; currently off Xarelto  Allergies  Allergen Reactions  . Ativan [Lorazepam]     Pt has paradoxical effect with sedating medications.     Patient Measurements: Height: 5\' 9"  (175.3 cm) Weight: 128 lb (58.06 kg) IBW/kg (Calculated) : 70.7 Heparin Dosing Weight: 70.7 kg  Vital Signs: Temp: 98.4 F (36.9 C) (03/03 0520) Temp Source: Oral (03/03 0520) BP: 97/63 mmHg (03/03 0520) Pulse Rate: 73 (03/03 0520)  Labs:  Recent Labs  06/25/14 1822  06/26/14 0542 06/26/14 1555 06/26/14 1600 06/26/14 2300 06/27/14 0543 06/27/14 0840  HGB 14.3  --  12.8*  --   --   --  12.3*  --   HCT 44.0  --  39.5  --   --   --  38.3*  --   PLT 138*  --  116*  --   --   --  107*  --   APTT  --   < > 49* 88*  --  141* 97*  --   LABPROT  --   --  19.1*  --   --   --   --   --   INR  --   --  1.59*  --   --   --   --   --   HEPARINUNFRC  --   < > 1.48*  --  0.83* 0.77*  --  0.48  CREATININE 1.45*  --  1.20  --   --   --  0.98  --   < > = values in this interval not displayed.  Estimated Creatinine Clearance: 78.2 mL/min (by C-G formula based on Cr of 0.98).   Medications:  Scheduled:  . antiseptic oral rinse  7 mL Mouth Rinse q12n4p  . chlorhexidine  15 mL Mouth Rinse BID  . metoprolol  2.5 mg Intravenous Q12H  . valproate sodium  500 mg Intravenous Q12H   Infusions:  . dextrose 5 % and 0.9% NaCl 75 mL/hr at 06/26/14 1638  . heparin 750 Units/hr (06/27/14 0103)   PRN: acetaminophen **OR** acetaminophen, morphine injection, ondansetron **OR** ondansetron (ZOFRAN) IV, RESOURCE THICKENUP CLEAR  Assessment: 44 y/o M with history of TBI, seizures, DVT, brought to ED on 06/25/14 with increasing difficulty with swallowing. Patient was admitted in September for acute respiratory failure requiring intubation and tracheostomy placement.   Patient was on Xarelto 20 mg  daily PTA for hx of UE and LE DVT, reportedly last taken 06/25/14 at 0900, but held on admission due to swallowing difficulty. Baseline anti-Xa level is elevated, consistent with recent Xarelto administration, but test is not calibrated to rivaroxaban and therefore cannot be used to quantitate degree of anticoagulation with this drug.   Today, 06/27/2014  Heparin level check 0.48 this am on 750 units/hr  Orders to discharge patient so will resume xarelto today  CBC: Hgb = 12.3, pltc = 107 - trending down  No bleeding or complications with IV reported per RN  Goal of Therapy:  Heparin level 0.3-0.7 units/ml after effect from Xarelto diminishes sufficiently Monitor platelets by anticoagulation protocol: Yes   Plan:   Resume xarelto this morning prior to discharge, stop heparin gtt at same time xarelto is stopped  To be discharged today  Juliette Alcideustin Zeigler, PharmD, BCPS.   Pager: [phone] 06/27/2014,9:10 AM

## 2014-06-27 NOTE — Progress Notes (Signed)
Hector [name] to be D/C'd Home per MD order.  Discussed prescriptions and follow up appointments with the patient. Prescriptions given to patient, medication list explained in detail. Pt verbalized understanding.    Medication List    STOP taking these medications        mirtazapine 15 MG tablet  Commonly known as:  REMERON      TAKE these medications        amantadine 100 MG capsule  Commonly known as:  SYMMETREL  Take 100 mg by mouth 2 (two) times daily.     antiseptic oral rinse 0.05 % Liqd solution  Commonly known as:  CPC / CETYLPYRIDINIUM CHLORIDE 0.05%  7 mLs by Mouth Rinse route 2 times daily at 12 noon and 4 pm.     bromocriptine 2.5 MG tablet  Commonly known as:  PARLODEL  Take 5 mg by mouth 2 (two) times daily.     chlorhexidine 0.12 % solution  Commonly known as:  PERIDEX  15 mLs by Mouth Rinse route 2 (two) times daily.     clonazePAM 0.5 MG tablet  Commonly known as:  KLONOPIN  Take 0.5 mg by mouth 2 (two) times daily as needed for anxiety.     divalproex 500 MG 24 hr tablet  Commonly known as:  DEPAKOTE ER  Take 1 tablet (500 mg total) by mouth 2 (two) times daily.     docusate sodium 100 MG capsule  Commonly known as:  COLACE  Take 100 mg by mouth 2 (two) times daily.     escitalopram 10 MG tablet  Commonly known as:  LEXAPRO  Take 10 mg by mouth daily.     furosemide 20 MG tablet  Commonly known as:  LASIX  Take 20 mg by mouth daily.     magnesium hydroxide 400 MG/5ML suspension  Commonly known as:  MILK OF MAGNESIA  Take 30 mLs by mouth daily as needed for mild constipation.     metoprolol tartrate 25 MG tablet  Commonly known as:  LOPRESSOR  Take 12.5 mg by mouth 2 (two) times daily.     ondansetron 4 MG tablet  Commonly known as:  ZOFRAN  Take 1 tablet (4 mg total) by mouth every 6 (six) hours.     oxyCODONE-acetaminophen 5-325 MG per tablet  Commonly known as:  PERCOCET/ROXICET  Take 1-2 tablets by mouth every 6 (six) hours as  needed for moderate pain or severe pain.     pantoprazole 40 MG tablet  Commonly known as:  PROTONIX  Take 40 mg by mouth daily.     polyethylene glycol packet  Commonly known as:  MIRALAX / GLYCOLAX  Take 17 g by mouth daily as needed for mild constipation.     QUEtiapine 100 MG tablet  Commonly known as:  SEROQUEL  Take 1 tablet (100 mg total) by mouth 3 (three) times daily.     RESOURCE THICKENUP CLEAR Powd  Take 120 g by mouth as needed.     rivaroxaban 20 MG Tabs tablet  Commonly known as:  XARELTO  Take 20 mg by mouth daily.     spironolactone 50 MG tablet  Commonly known as:  ALDACTONE  Take 50 mg by mouth daily.     Valproic Acid 250 MG/5ML Syrp syrup  Commonly known as:  DEPAKENE  Place 10 mLs (500 mg total) into feeding tube every 12 (twelve) hours.        Filed Vitals:   06/27/14 [id number]  BP: 97/63  Pulse: 73  Temp: 98.4 F (36.9 C)  Resp: 16    Skin clean, dry and intact without evidence of skin break down, no evidence of skin tears noted. IV catheter discontinued intact. Site without signs and symptoms of complications. Dressing and pressure applied. Pt denies pain at this time. No complaints noted.  An After Visit Summary was printed and given to the patient. Patient escorted via WC, and D/C home via private auto.  Rondel Jumbo 06/27/2014 4:53 PM

## 2014-06-27 NOTE — Progress Notes (Signed)
ANTICOAGULATION CONSULT NOTE - Follow Up Consult  Pharmacy Consult for Heparin Indication: Hx of DVT; currently off Xarelto  Allergies  Allergen Reactions  . Ativan [Lorazepam]     Pt has paradoxical effect with sedating medications.     Patient Measurements: Height: 5\' 9"  (175.3 cm) Weight: 127 lb 12.8 oz (57.97 kg) IBW/kg (Calculated) : 70.7 Heparin Dosing Weight:   Vital Signs: Temp: 97.9 F (36.6 C) (03/02 2101) Temp Source: Oral (03/02 2101) BP: 112/66 mmHg (03/02 2101) Pulse Rate: 84 (03/02 2101)  Labs:  Recent Labs  06/25/14 1822 06/26/14 0542 06/26/14 1555 06/26/14 1600 06/26/14 2300  HGB 14.3 12.8*  --   --   --   HCT 44.0 39.5  --   --   --   PLT 138* 116*  --   --   --   APTT  --  49* 88*  --  141*  LABPROT  --  19.1*  --   --   --   INR  --  1.59*  --   --   --   HEPARINUNFRC  --  1.48*  --  0.83* 0.77*  CREATININE 1.45* 1.20  --   --   --     Estimated Creatinine Clearance: 63.8 mL/min (by C-G formula based on Cr of 1.2).   Medications:  Infusions:  . dextrose 5 % and 0.9% NaCl 75 mL/hr at 06/26/14 1638  . heparin 750 Units/hr (06/27/14 0103)    Assessment: Patient with high heparin level and PTT.  The two lab values correlate, so will only follow heparin levels for now on.  No issues per RN.  Goal of Therapy:  Heparin level 0.3-0.7 units/ml Monitor platelets by anticoagulation protocol: Yes   Plan:  Decrease heparin to 750 units/hr Recheck level at 0800  Darlina GuysGrimsley Jr, GuthrieJulian Crowford 06/27/2014,1:09 AM

## 2014-06-27 NOTE — Progress Notes (Signed)
Speech Language Pathology Treatment: Dysphagia  Patient Details Name: Hector [name] MRN: [id number] DOB: 07/14/1970 Today's Date: 06/27/2014 Time: 1040-1106 SLP Time Calculation (min) (ACUTE ONLY): 26 min  Assessment / Plan / Recommendation Clinical Impression  Pt seen to assess po tolerance and to reinforce effective compensation strategies found to be effective on MBS.  Per review of chart, pt consumed 100% of his meal last evening!!!!    Observed him consuming applesauce, graham cracker, thickened water and thin.  Pt takes large boluses and needs moderate cues to take smaller amounts.  He did not demonstrate overt indications of aspiration with intake - overt cough with aspiration of thin on MBS yesterday.  Multiple swallows noted across consistencies with reflexive throat clearing *likely laryngeal penetration.  Suspect dry swalllows are reflexive due to sensation of residuals.  Cues to clear throat accomplished approximately 80% of opportunities.    Recommend to continue diet with strict aspiration precaution.  SLP requested RN inform this SlP when family arrives to allow SLP to educate them to findings of swallow evaluation.  Further recommend follow up with ENT as outpt to determine if pt would benefit from being scoped to help determine source of pharyngeal discomfort.     HPI HPI: Hector [name] is a 44 y.o. male with history of traumatic brain injury, DVT, seizures was brought to the ER after patient was found to have increasing difficulty swallowing since morning on day of admit.  Patient was admitted in September for acute respiratory failure and at that time patient was intubated and eventually had tracheostomy and also was placed on PEG tube. Patient's father states that patient has been eating well recently but today started having some choking episodes per MD note. Patient is finding it difficult to swallow. Worsening of his renal function found via labs and pt has been admitted for  further management of his dysphagia and dehydration per MD review.  Pt previously had a trach and PEG placed in fall 2015 - since removed.  Went to rehab at [facility] and was dc'd home with family.  Swallow evaluation ordered.  BSE conducted with recommendation for MBS- MBS completed with modified diet and compensation strategies recommended to mitigate risk.  CT head 06/26/14 indicated further atrophy involving the left parietal and occipital lobes.     Pertinent Vitals Pain Assessment: Faces Faces Pain Scale: No hurt  SLP Plan  Continue with current plan of care    Recommendations Diet recommendations: Dysphagia 3 (mechanical soft);Nectar-thick liquid Medication Administration: Crushed with puree Supervision: Patient able to self feed;Full supervision/cueing for compensatory strategies Compensations: Slow rate;Small sips/bites;Multiple dry swallows after each bite/sip;Clear throat intermittently Postural Changes and/or Swallow Maneuvers: Seated upright 90 degrees;Upright 30-60 min after meal              Oral Care Recommendations: Oral care BID Plan: Continue with current plan of care    GO     Donavan Burnetamara Eldor Conaway, MS [facility] ([facility])CCC SLP [phone]

## 2014-07-06 ENCOUNTER — Emergency Department (HOSPITAL_COMMUNITY)
Admission: EM | Admit: 2014-07-06 | Discharge: 2014-07-06 | Disposition: A | Discharge status: Home / Self Care | Payer: Medicaid Other | Attending: Emergency Medicine | Admitting: Emergency Medicine

## 2014-07-06 ENCOUNTER — Encounter (HOSPITAL_COMMUNITY): Payer: Self-pay | Admitting: Emergency Medicine

## 2014-07-06 DIAGNOSIS — Z87442 Personal history of urinary calculi: Secondary | ICD-10-CM | POA: Insufficient documentation

## 2014-07-06 DIAGNOSIS — Z7901 Long term (current) use of anticoagulants: Secondary | ICD-10-CM | POA: Insufficient documentation

## 2014-07-06 DIAGNOSIS — Z87891 Personal history of nicotine dependence: Secondary | ICD-10-CM | POA: Insufficient documentation

## 2014-07-06 DIAGNOSIS — Z86718 Personal history of other venous thrombosis and embolism: Secondary | ICD-10-CM | POA: Insufficient documentation

## 2014-07-06 DIAGNOSIS — K219 Gastro-esophageal reflux disease without esophagitis: Secondary | ICD-10-CM | POA: Insufficient documentation

## 2014-07-06 DIAGNOSIS — Z87820 Personal history of traumatic brain injury: Secondary | ICD-10-CM | POA: Insufficient documentation

## 2014-07-06 DIAGNOSIS — R21 Rash and other nonspecific skin eruption: Secondary | ICD-10-CM

## 2014-07-06 DIAGNOSIS — F329 Major depressive disorder, single episode, unspecified: Secondary | ICD-10-CM | POA: Insufficient documentation

## 2014-07-06 DIAGNOSIS — Z79899 Other long term (current) drug therapy: Secondary | ICD-10-CM | POA: Diagnosis not present

## 2014-07-06 LAB — CBC WITH DIFFERENTIAL/PLATELET
Basophils Absolute: 0 10*3/uL (ref 0.0–0.1)
Basophils Relative: 0 % (ref 0–1)
Eosinophils Absolute: 0.2 10*3/uL (ref 0.0–0.7)
Eosinophils Relative: 2 % (ref 0–5)
HCT: 40 % (ref 39.0–52.0)
Hemoglobin: 13.3 g/dL (ref 13.0–17.0)
Lymphocytes Relative: 31 % (ref 12–46)
Lymphs Abs: 2.4 10*3/uL (ref 0.7–4.0)
MCH: 32.1 pg (ref 26.0–34.0)
MCHC: 33.3 g/dL (ref 30.0–36.0)
MCV: 96.6 fL (ref 78.0–100.0)
MONO ABS: 0.9 10*3/uL (ref 0.1–1.0)
MONOS PCT: 11 % (ref 3–12)
NEUTROS ABS: 4.2 10*3/uL (ref 1.7–7.7)
NEUTROS PCT: 56 % (ref 43–77)
Platelets: 149 10*3/uL — ABNORMAL LOW (ref 150–400)
RBC: 4.14 MIL/uL — ABNORMAL LOW (ref 4.22–5.81)
RDW: 16.4 % — ABNORMAL HIGH (ref 11.5–15.5)
WBC: 7.7 10*3/uL (ref 4.0–10.5)

## 2014-07-06 LAB — COMPREHENSIVE METABOLIC PANEL
ALT: 22 U/L (ref 0–53)
AST: 30 U/L (ref 0–37)
Albumin: 4.3 g/dL (ref 3.5–5.2)
Alkaline Phosphatase: 120 U/L — ABNORMAL HIGH (ref 39–117)
Anion gap: 10 (ref 5–15)
BILIRUBIN TOTAL: 0.5 mg/dL (ref 0.3–1.2)
BUN: 24 mg/dL — ABNORMAL HIGH (ref 6–23)
CHLORIDE: 102 mmol/L (ref 96–112)
CO2: 25 mmol/L (ref 19–32)
Calcium: 9.3 mg/dL (ref 8.4–10.5)
Creatinine, Ser: 1.32 mg/dL (ref 0.50–1.35)
GFR calc non Af Amer: 64 mL/min — ABNORMAL LOW (ref >90)
GFR, EST AFRICAN AMERICAN: 74 mL/min — AB (ref >90)
Glucose, Bld: 90 mg/dL (ref 70–99)
Potassium: 4.4 mmol/L (ref 3.5–5.1)
Sodium: 137 mmol/L (ref 135–145)
Total Protein: 8.6 g/dL — ABNORMAL HIGH (ref 6.0–8.3)

## 2014-07-06 LAB — URINALYSIS, ROUTINE W REFLEX MICROSCOPIC
Bilirubin Urine: NEGATIVE
GLUCOSE, UA: NEGATIVE mg/dL
Hgb urine dipstick: NEGATIVE
KETONES UR: NEGATIVE mg/dL
LEUKOCYTES UA: NEGATIVE
Nitrite: NEGATIVE
PH: 6 (ref 5.0–8.0)
PROTEIN: NEGATIVE mg/dL
Specific Gravity, Urine: 1.014 (ref 1.005–1.030)
Urobilinogen, UA: 1 mg/dL (ref 0.0–1.0)

## 2014-07-06 LAB — AMMONIA: Ammonia: 30 umol/L (ref 11–32)

## 2014-07-06 MED ORDER — PREDNISONE 20 MG PO TABS: 40.0000 mg | ORAL_TABLET | Freq: Every day | ORAL | Status: AC

## 2014-07-06 MED ORDER — DIPHENHYDRAMINE HCL 25 MG PO CAPS
25.0000 mg | ORAL_CAPSULE | Freq: Four times a day (QID) | ORAL | Status: DC | PRN
  Administered 2014-07-06: 25 mg via ORAL
  Filled 2014-07-06: qty 1

## 2014-07-06 MED ORDER — PREDNISONE 20 MG PO TABS
60.0000 mg | ORAL_TABLET | Freq: Once | ORAL | Status: AC
  Administered 2014-07-06: 60 mg via ORAL
  Filled 2014-07-06: qty 3

## 2014-07-06 NOTE — Discharge Instructions (Signed)
Rash A rash is a change in the color or feel of your skin. There are many different types of rashes. You may have other problems along with your rash. HOME CARE  Avoid the thing that caused your rash.  Do not scratch your rash.  You may take cools baths to help stop itching.  Only take medicines as told by your doctor.  Keep all doctor visits as told. GET HELP RIGHT AWAY IF:   Your pain, puffiness (swelling), or redness gets worse.  You have a fever.  You have new or severe problems.  You have body aches, watery poop (diarrhea), or you throw up (vomit).  Your rash is not better after 3 days. MAKE SURE YOU:   Understand these instructions.  Will watch your condition.  Will get help right away if you are not doing well or get worse. Document Released: 09/29/2007 Document Revised: 07/05/2011 Document Reviewed: 01/25/2011 ExitCare Patient Information 2015 [employer]. This information is not intended to replace advice given to you by your health care provider. Make sure you discuss any questions you have with your health care provider.  

## 2014-07-06 NOTE — ED Provider Notes (Signed)
CSN: [id number]     Arrival date & time 07/06/14  1438 History  This chart was scribed for non-physician practitioner, Arthor Captain, PA-C,working with Elwin Mocha, MD, by Karle Plumber, ED Scribe. This patient was seen in room WTR8/WTR8 and the patient's care was started at 3:51 PM.  Chief Complaint  Patient presents with  . Rash   Patient is a 44 y.o. male presenting with rash. The history is provided by the patient and medical records. No language interpreter was used.  Rash Associated symptoms: no fever, no nausea, no shortness of breath and not vomiting     HPI Comments:  Hector [name] is a 44 y.o. male who presents to the Emergency Department complaining of a red, swollen rash on bilateral feet and scalp that began about 3.5 hours ago. He reports that he just started Lactulose approximately four hours ago and the rash began almost immediately after. He has not taken anything for his symptoms. Denies modifying factors. He denies SOB, difficulty swallowing, nausea, vomiting, fever or chills. PMHx of TBI, seizure, DVT, cirrhosis, depression and GERD.  Past Medical History  Diagnosis Date  . TBI (traumatic brain injury)     07/2013, fall from standing post seizure  . Seizure   . DVT (deep venous thrombosis)     UE, LE DVT during hospitalization  . H/O renal calculi   . Cirrhosis   . Depression   . GERD (gastroesophageal reflux disease)    Past Surgical History  Procedure Laterality Date  . Tracheostomy      feinstein  . Gastrostomy tube placement    . Removal of gastrostomy tube     Family History  Problem Relation Age of Onset  . Congestive Heart Failure Brother     Deceased age 73  . Hypercholesterolemia Mother   . Hypertension Mother    History  Substance Use Topics  . Smoking status: Former Smoker -- 20 years    Quit date: 08/12/2013  . Smokeless tobacco: Never Used  . Alcohol Use: No     Comment: Former ETOH abuse.  Quit Oct 2014.      Review of Systems   Constitutional: Negative for fever and chills.  HENT: Negative for trouble swallowing.   Respiratory: Negative for shortness of breath.   Gastrointestinal: Negative for nausea and vomiting.  Skin: Positive for rash.    Allergies  Ativan  Home Medications   Prior to Admission medications   Medication Sig Start Date End Date Taking? Authorizing Provider  amantadine (SYMMETREL) 100 MG capsule Take 100 mg by mouth 2 (two) times daily.   Yes Historical Provider, MD  antiseptic oral rinse (CPC / CETYLPYRIDINIUM CHLORIDE 0.05%) 0.05 % LIQD solution 7 mLs by Mouth Rinse route 2 times daily at 12 noon and 4 pm. 06/27/14  Yes Alison Murray, MD  bromocriptine (PARLODEL) 2.5 MG tablet Take 5 mg by mouth 2 (two) times daily.   Yes Historical Provider, MD  clonazePAM (KLONOPIN) 0.5 MG tablet Take 0.5 mg by mouth 2 (two) times daily as needed for anxiety (anxiety).    Yes Historical Provider, MD  divalproex (DEPAKOTE ER) 500 MG 24 hr tablet Take 1 tablet (500 mg total) by mouth 2 (two) times daily. 03/20/14  Yes Drema Dallas, DO  docusate sodium (COLACE) 100 MG capsule Take 100 mg by mouth 2 (two) times daily.   Yes Historical Provider, MD  enoxaparin (LOVENOX) 150 MG/ML injection Inject 150 mg into the skin every 12 (twelve) hours.  Yes Historical Provider, MD  escitalopram (LEXAPRO) 10 MG tablet Take 10 mg by mouth daily.   Yes Historical Provider, MD  furosemide (LASIX) 20 MG tablet Take 20 mg by mouth daily.   Yes Historical Provider, MD  lactulose (CHRONULAC) 10 GM/15ML solution Take 20 g by mouth 2 (two) times daily.   Yes Historical Provider, MD  loteprednol (LOTEMAX) 0.5 % ophthalmic suspension Place 1 drop into both eyes 2 (two) times daily as needed (dry eyes).   Yes Historical Provider, MD  magnesium hydroxide (MILK OF MAGNESIA) 400 MG/5ML suspension Take 30 mLs by mouth daily as needed for mild constipation (constipation).    Yes Historical Provider, MD  Maltodextrin-Xanthan Gum (RESOURCE  THICKENUP CLEAR) POWD Take 120 g by mouth as needed. Patient taking differently: Take 1 Can by mouth daily as needed (swallow).  06/27/14  Yes Alison MurrayAlma M Devine, MD  metoprolol tartrate (LOPRESSOR) 25 MG tablet Take 12.5 mg by mouth 2 (two) times daily.   Yes Historical Provider, MD  ondansetron (ZOFRAN) 4 MG tablet Take 1 tablet (4 mg total) by mouth every 6 (six) hours. Patient taking differently: Take 4 mg by mouth 2 (two) times daily.  12/10/13  Yes Junious SilkHannah Merrell, PA-C  pantoprazole (PROTONIX) 40 MG tablet Take 40 mg by mouth daily.   Yes Historical Provider, MD  QUEtiapine (SEROQUEL) 100 MG tablet Take 1 tablet (100 mg total) by mouth 3 (three) times daily. Patient taking differently: Take 100 mg by mouth daily.  01/03/14  Yes Renae FickleMackenzie Short, MD  rivaroxaban (XARELTO) 20 MG TABS tablet Take 20 mg by mouth daily.   Yes Historical Provider, MD  spironolactone (ALDACTONE) 50 MG tablet Take 50 mg by mouth daily.   Yes Historical Provider, MD  valACYclovir (VALTREX) 1000 MG tablet Take 1,000 mg by mouth 2 (two) times daily.   Yes Historical Provider, MD  chlorhexidine (PERIDEX) 0.12 % solution 15 mLs by Mouth Rinse route 2 (two) times daily. Patient not taking: Reported on 07/06/2014 06/27/14   Alison MurrayAlma M Devine, MD  oxyCODONE-acetaminophen (PERCOCET/ROXICET) 5-325 MG per tablet Take 1-2 tablets by mouth every 6 (six) hours as needed for moderate pain or severe pain. Patient not taking: Reported on 07/06/2014 01/22/14   Oneal GroutMahima Pandey, MD  polyethylene glycol (MIRALAX / GLYCOLAX) packet Take 17 g by mouth daily as needed for mild constipation.    Historical Provider, MD  predniSONE (DELTASONE) 20 MG tablet Take 2 tablets (40 mg total) by mouth daily with breakfast. 07/06/14 07/09/14  Nelva Nayobert Jamaal Bernasconi, MD  Valproic Acid (DEPAKENE) 250 MG/5ML SYRP syrup Place 10 mLs (500 mg total) into feeding tube every 12 (twelve) hours. Patient not taking: Reported on 07/06/2014 01/01/14   Dorothea OgleIskra M Myers, MD   Triage Vitals: BP 107/76  mmHg  Pulse 94  Temp(Src) 98.3 F (36.8 C) (Oral)  Resp 17  SpO2 99% Physical Exam  Constitutional: He is oriented to person, place, and time. He appears well-developed and well-nourished.  HENT:  Head: Normocephalic and atraumatic.  Eyes: EOM are normal.  Neck: Normal range of motion.  Cardiovascular: Normal rate, regular rhythm and normal heart sounds.  Exam reveals no gallop and no friction rub.   No murmur heard. Pulmonary/Chest: Effort normal and breath sounds normal. No respiratory distress. He has no wheezes. He has no rales.  Musculoskeletal: Normal range of motion.  Neurological: He is alert and oriented to person, place, and time.  Expressive aphasia.  Skin: Skin is warm and dry. Rash noted.  Erythematous macular  rash generalized from head to toe. Singular and confluent in other areas. Pt scratches during examination. Swelling to bilateral feet. Skin warm to touch.  Psychiatric: He has a normal mood and affect. His behavior is normal.  Nursing note and vitals reviewed.   ED Course  Procedures (including critical care time) DIAGNOSTIC STUDIES: Oxygen Saturation is 99% on RA, normal by my interpretation.   COORDINATION OF CARE: 3:55 PM- Will speak with Dr. Gwendolyn Grant about appropriate course of treatment. Pt verbalizes understanding and agrees to plan.  Medications  diphenhydrAMINE (BENADRYL) capsule 25 mg (25 mg Oral Given 07/06/14 1616)  predniSONE (DELTASONE) tablet 60 mg (60 mg Oral Given 07/06/14 1616)    Labs Review Labs Reviewed  COMPREHENSIVE METABOLIC PANEL - Abnormal; Notable for the following:    BUN 24 (*)    Total Protein 8.6 (*)    Alkaline Phosphatase 120 (*)    GFR calc non Af Amer 64 (*)    GFR calc Af Amer 74 (*)    All other components within normal limits  CBC WITH DIFFERENTIAL/PLATELET - Abnormal; Notable for the following:    RBC 4.14 (*)    RDW 16.4 (*)    Platelets 149 (*)    All other components within normal limits  AMMONIA  URINALYSIS,  ROUTINE W REFLEX MICROSCOPIC    Imaging Review No results found.    MDM   Final diagnoses:  Rash     Patient had no airway difficulty.  Patient stable for discharge.   I personally performed the services described in this documentation, which was scribed in my presence. The recorded information has been reviewed and is accurate.    Nelva Nay, MD 07/06/14 1807

## 2014-07-06 NOTE — ED Notes (Signed)
Pt c/o rash x1 day. Reported he started taking a new medication-lactulose this morning. Denies difficulty breathing. Pt NAD.

## 2014-08-08 ENCOUNTER — Telehealth: Payer: Self-pay | Admitting: Family Medicine

## 2014-08-08 DIAGNOSIS — S069X5A Unspecified intracranial injury with loss of consciousness greater than 24 hours with return to pre-existing conscious level, initial encounter: Secondary | ICD-10-CM

## 2014-08-08 NOTE — Telephone Encounter (Signed)
[facility]---Case Management for Medicaid: Family is requesting an order from you for OT, PT, & Speech Therapy for patient at [facility]. They have tried to get this patient therapy services at home but Medicaid will not pay for in home services.

## 2014-08-09 NOTE — Telephone Encounter (Signed)
I spoke with patient's father/Lester. Advised that patient needed a return office visit he was passed due, appt scheduled for 4/22 @ 9:00. Dr. Everlena CooperJaffe ok'd to proceed with therapy orders. Orders placed in Epic for [facility]. [facility] will call them to schedule appt.

## 2014-08-09 NOTE — Telephone Encounter (Signed)
Will order, but patient must schedule follow-up.  He cancelled previous appointment in February and did not reschedule.

## 2014-08-16 ENCOUNTER — Ambulatory Visit (INDEPENDENT_AMBULATORY_CARE_PROVIDER_SITE_OTHER): Payer: Medicaid Other | Admitting: Neurology

## 2014-08-16 ENCOUNTER — Encounter: Payer: Self-pay | Admitting: Neurology

## 2014-08-16 ENCOUNTER — Other Ambulatory Visit: Payer: Self-pay | Admitting: *Deleted

## 2014-08-16 VITALS — BP 126/74 | HR 68 | Resp 16 | Ht 68.0 in | Wt 134.4 lb

## 2014-08-16 DIAGNOSIS — I6200 Nontraumatic subdural hemorrhage, unspecified: Secondary | ICD-10-CM

## 2014-08-16 DIAGNOSIS — G40909 Epilepsy, unspecified, not intractable, without status epilepticus: Secondary | ICD-10-CM

## 2014-08-16 DIAGNOSIS — S069X5S Unspecified intracranial injury with loss of consciousness greater than 24 hours with return to pre-existing conscious level, sequela: Secondary | ICD-10-CM | POA: Diagnosis not present

## 2014-08-16 DIAGNOSIS — Z720 Tobacco use: Secondary | ICD-10-CM | POA: Diagnosis not present

## 2014-08-16 DIAGNOSIS — G40209 Localization-related (focal) (partial) symptomatic epilepsy and epileptic syndromes with complex partial seizures, not intractable, without status epilepticus: Secondary | ICD-10-CM | POA: Insufficient documentation

## 2014-08-16 DIAGNOSIS — S065X9A Traumatic subdural hemorrhage with loss of consciousness of unspecified duration, initial encounter: Secondary | ICD-10-CM

## 2014-08-16 DIAGNOSIS — S065XAA Traumatic subdural hemorrhage with loss of consciousness status unknown, initial encounter: Secondary | ICD-10-CM

## 2014-08-16 NOTE — Patient Instructions (Signed)
Continue Depakote ER 500mg  twice daily Recommend Dr. Lequita HaltMorgan to order speech therapy Continue working on to stop smoking Follow up in 6 months.

## 2014-08-16 NOTE — Progress Notes (Signed)
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  Records, labs and CT of head reviewed.  He is accompanied by his father who provides some history.  UPDATE: He currently takes Depakote ER  twice daily.  No recurrent seizures.  Valproic acid level from March was 36.9.  ALP was 120, AST 30 and ALT 22.  Platelets have dropped from 300s-400s in September to 100s-140s in March.  He has cut back on his cigarettes.  He was started on Lasix to help with edema.  He was admitted to the hospital in March for confusion related to dehydration and dysphagia.  CT of head showed no acute findings.  He was started on a dysphagia 3 diet.    HISTORY: On 08/12/13, he had a seizure while in a convenience store.  He fell, hitting his head on the pavement.  He was brought to the ED at [facility].  CT of the head showed a large left subdural hematoma with subarachnoid blood over the left convexity with a 12 mm left to right shift.  He underwent emergent craniotomy.  He was in an induced coma for 4-5 weeks.  His hospital course was complicated by development of a DVT secondary to PICC, requiring heparin drip, VDRF status post extubation, and pneumonia.  He is still taking Xarelto.  He had a G-tube placed for nutrition.  He followed up with an neurologist, Dr. Caswell Corwin.  He was placed on Zoloft for agitation and outbursts.  He presented to the ED at [facility] on 12/06/13 for new shaking episodes with extreme agitation and combativeness.  It seemed to have occurred after taking a dose of risperidone.  CT of the head from 12/06/13, showed post-surgical changes, with interval clearing of left extra-axial fluid collection, mild increase in ventricular size (left greater than  right), hypodense areas involving the left cerebrum and no midline shift or hemorrhage.  UA and urine drug screen was negative.  Except for ALP of 196, CMP unremarkable.  CBC revealed no infection.  It was recommended to discontinue Zoloft.  Due to persistent symptoms, he was brought and admitted to [facility].  He was found to have HCAP and sepsis and was treated with antibiotics.  He was started on Lexapro.  Keppra was switched to valproic acid.  He resides at [facility].  He has regained much more strength in the right upper extremity.  His mood is stable.  Recently, his PEG and trach was removed.  He has an unsteady gait but ambulates with other people around.  He has global aphasia.  He finished PT/OT and is currently still undergoing speech therapy.  He has had no further seizures.  He denies headache  He has a history of alcoholism.  Prior to the accident, he had two other seizures over a 5-6 year period.  It was attributed to his drinking.  He was never taking an antiepileptic medication.  He has a tenth grade education.  Prior to the accident, he had his own apartment.  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  In addition to Depakene and Lexapro, he is also taking seroquel, Remeron, amantadine, bromocriptine and Xarelto.  PAST MEDICAL HISTORY: Past Medical History  Diagnosis Date  . TBI (traumatic brain injury)  07/2013, fall from standing post seizure  . Seizure   . DVT (deep venous thrombosis)     UE, LE DVT during hospitalization  . H/O renal calculi   . Cirrhosis   . Depression   . GERD (gastroesophageal reflux disease)     MEDICATIONS: Current Outpatient Prescriptions on File Prior to Visit  Medication Sig Dispense Refill  . amantadine (SYMMETREL) 100 MG capsule Take 100 mg by mouth 2 (two) times daily.    Marland Kitchen antiseptic oral rinse (CPC / CETYLPYRIDINIUM CHLORIDE 0.05%) 0.05 % LIQD solution 7 mLs by Mouth Rinse route 2 times daily  at 12 noon and 4 pm. 118 mL 0  . bromocriptine (PARLODEL) 2.5 MG tablet Take 5 mg by mouth 2 (two) times daily.    . chlorhexidine (PERIDEX) 0.12 % solution 15 mLs by Mouth Rinse route 2 (two) times daily. 120 mL 0  . clonazePAM (KLONOPIN) 0.5 MG tablet Take 0.5 mg by mouth 2 (two) times daily as needed for anxiety (anxiety).     Marland Kitchen divalproex (DEPAKOTE ER) 500 MG 24 hr tablet Take 1 tablet (500 mg total) by mouth 2 (two) times daily. 60 tablet 5  . docusate sodium (COLACE) 100 MG capsule Take 100 mg by mouth 2 (two) times daily.    Marland Kitchen enoxaparin (LOVENOX) 150 MG/ML injection Inject 150 mg into the skin every 12 (twelve) hours.    Marland Kitchen escitalopram (LEXAPRO) 10 MG tablet Take 10 mg by mouth daily.    . furosemide (LASIX) 20 MG tablet Take 20 mg by mouth daily.    Marland Kitchen loteprednol (LOTEMAX) 0.5 % ophthalmic suspension Place 1 drop into both eyes 2 (two) times daily as needed (dry eyes).    . magnesium hydroxide (MILK OF MAGNESIA) 400 MG/5ML suspension Take 30 mLs by mouth daily as needed for mild constipation (constipation).     . Maltodextrin-Xanthan Gum (RESOURCE THICKENUP CLEAR) POWD Take 120 g by mouth as needed. (Patient taking differently: Take 1 Can by mouth daily as needed (swallow). ) 1 Can 1  . metoprolol tartrate (LOPRESSOR) 25 MG tablet Take 12.5 mg by mouth 2 (two) times daily.    . ondansetron (ZOFRAN) 4 MG tablet Take 1 tablet (4 mg total) by mouth every 6 (six) hours. (Patient taking differently: Take 4 mg by mouth 2 (two) times daily. ) 12 tablet 0  . oxyCODONE-acetaminophen (PERCOCET/ROXICET) 5-325 MG per tablet Take 1-2 tablets by mouth every 6 (six) hours as needed for moderate pain or severe pain. 240 tablet 0  . pantoprazole (PROTONIX) 40 MG tablet Take 40 mg by mouth daily.    . polyethylene glycol (MIRALAX / GLYCOLAX) packet Take 17 g by mouth daily as needed for mild constipation.    . QUEtiapine (SEROQUEL) 100 MG tablet Take 1 tablet (100 mg total) by mouth 3 (three) times daily.  (Patient taking differently: Take 100 mg by mouth daily. ) 90 tablet 0  . rivaroxaban (XARELTO) 20 MG TABS tablet Take 20 mg by mouth daily.    Marland Kitchen spironolactone (ALDACTONE) 50 MG tablet Take 50 mg by mouth daily.    . valACYclovir (VALTREX) 1000 MG tablet Take 1,000 mg by mouth 2 (two) times daily.    . Valproic Acid (DEPAKENE) 250 MG/5ML SYRP syrup Place 10 mLs (500 mg total) into feeding tube every 12 (twelve) hours. 600 mL    No current facility-administered medications on file prior to visit.    ALLERGIES: Allergies  Allergen Reactions  . Ativan [Lorazepam]  Pt has paradoxical effect with sedating medications.   . Lactulose Hives    FAMILY HISTORY: Family History  Problem Relation Age of Onset  . Congestive Heart Failure Brother     Deceased age 46  . Hypercholesterolemia Mother   . Hypertension Mother     SOCIAL HISTORY: History   Social History  . Marital Status: Single    Spouse Name: N/A  . Number of Children: N/A  . Years of Education: N/A   Occupational History  . Not on file.   Social History Main Topics  . Smoking status: Current Some Day Smoker -- 20 years    Last Attempt to Quit: 08/12/2013  . Smokeless tobacco: Never Used  . Alcohol Use: No     Comment: Former ETOH abuse.  Quit Oct 2014.    . Drug Use: No  . Sexual Activity: No   Other Topics Concern  . Not on file   Social History Narrative    REVIEW OF SYSTEMS: Constitutional: No fevers, chills, or sweats, no generalized fatigue, change in appetite Eyes: No visual changes, double vision, eye pain Ear, nose and throat: No hearing loss, ear pain, nasal congestion, sore throat Cardiovascular: No chest pain, palpitations Respiratory:  No shortness of breath at rest or with exertion, wheezes GastrointestinaI: No nausea, vomiting, diarrhea, abdominal pain, fecal incontinence Genitourinary:  No dysuria, urinary retention or frequency Musculoskeletal:  No neck pain, back pain Integumentary: No  rash, pruritus, skin lesions Neurological: as above Psychiatric: No depression, insomnia, anxiety Endocrine: No palpitations, fatigue, diaphoresis, mood swings, change in appetite, change in weight, increased thirst Hematologic/Lymphatic:  No anemia, purpura, petechiae. Allergic/Immunologic: no itchy/runny eyes, nasal congestion, recent allergic reactions, rashes  PHYSICAL EXAM: Filed Vitals:   08/16/14 0823  BP: 126/74  Pulse: 68  Resp: 16   General: No acute distress Head:  Normocephalic/atraumatic Eyes:  Fundoscopic exam unremarkable without vessel changes, exudates, hemorrhages or papilledema. Neck: supple, no paraspinal tenderness, full range of motion Heart:  Regular rate and rhythm Lungs:  Clear to auscultation bilaterally Back: No paraspinal tenderness Neurological Exam: alert, unable to assess orientation, recent and remote memory, fund of knowledge, attention and concentration. Speech fluent and not dysarthric, but unintelligible.  Expressive more than receptive aphasia.  Unable to name or repeat.  Follows some simple commands but not others.  Unable to write.  Unable to assess CN V.  Blinks to threat bilaterally.  Otherwise, CN II-XII intact.  mild increased tone in right upper extremity, no fasciculations.  Motor 5-/5 on the right upper and lower extremities.  5/5 left side.  Unable to assess sensation.  DTR 3+ right upper and lower extremities.  2+ on left.  Unable to assess finger to nose or heel to shin.  Wide-based gait.  IMPRESSION: Traumatic brain injury Traumatic SDH status post craniotomy Seizure disorder Tobacco abuse  PLAN: 1.  Continue Depakote ER 500mg  twice daily.  Level is a little low, but since he has been doing well with no recurrent seizures, I won't increase dose at this time. 2.  Speech therapy 3.  Follow up in 6 months. 4.  Continue working on smoking cessation.   Shon MilletAdam Jaffe, DO  CC:  Desiree LucyKenneth Morgan

## 2014-08-20 ENCOUNTER — Ambulatory Visit: Payer: Medicaid Other | Attending: Neurology

## 2014-08-20 ENCOUNTER — Ambulatory Visit: Payer: Medicaid Other

## 2014-08-20 ENCOUNTER — Ambulatory Visit: Payer: Medicaid Other | Admitting: Occupational Therapy

## 2014-08-20 DIAGNOSIS — R269 Unspecified abnormalities of gait and mobility: Secondary | ICD-10-CM | POA: Insufficient documentation

## 2014-08-20 NOTE — Therapy (Signed)
[facility] [address] [place], [place], [address] Phone: [phone]   Fax:  [phone]  Physical Therapy Evaluation  Patient Details  Name: Hector [name] MRN: [id number] Date of Birth: 04-21-1971 Referring Provider:  Ulyess Blossom, MD  Encounter Date: 08/20/2014      PT End of Session - 08/20/14 0958    Visit Number 1   Number of Visits 9   Date for PT Re-Evaluation 09/19/14   Authorization Type Medicaid-pending auth   PT Start Time 0801   PT Stop Time 0845   PT Time Calculation (min) 44 min   Equipment Utilized During Treatment Gait belt   Activity Tolerance Patient tolerated treatment well   Behavior During Therapy Impulsive      Past Medical History  Diagnosis Date  . TBI (traumatic brain injury)     07/2013, fall from standing post seizure  . Seizure   . DVT (deep venous thrombosis)     UE, LE DVT during hospitalization  . H/O renal calculi   . Cirrhosis   . Depression   . GERD (gastroesophageal reflux disease)     Past Surgical History  Procedure Laterality Date  . Tracheostomy      feinstein  . Gastrostomy tube placement    . Removal of gastrostomy tube      There were no vitals filed for this visit.  Visit Diagnosis:  Abnormality of gait - Plan: PT plan of care cert/re-cert      Subjective Assessment - 08/20/14 0814    Subjective Impaired balance, unsteady, R-sided weakness   Patient is accompained by: Family member  pt's mom-Nancy   Pertinent History Seizure history, TBI 07/2013, B foot redness/edema/pain, aphasia, DVT   Patient Stated Goals Improve balance   Currently in Pain? Yes   Pain Score --  unable to rate   Pain Location Foot   Pain Orientation Right;Left   Pain Descriptors / Indicators --  grimaced, unable to describe.   Pain Type Chronic pain   Pain Onset More than a month ago   Pain Frequency Constant   Aggravating Factors  palpation   Pain Relieving  Factors none            [facility] PT Assessment - 08/20/14 0001    Assessment   Medical Diagnosis TBI   Onset Date 08/22/13   Prior Therapy PT, OT, and speech last year after hospitalization   Precautions   Precautions Fall   Restrictions   Weight Bearing Restrictions No   Balance Screen   Has the patient fallen in the past 6 months No   Has the patient had a decrease in activity level because of a fear of falling?  No   Is the patient reluctant to leave their home because of a fear of falling?  No   Home Environment   Living Enviornment Private residence   Living Arrangements Parent   Available Help at Discharge Family   Type of Home House   Home Access Level entry   Home Layout One level   Home Equipment None   Prior Function   Level of Independence Independent with basic ADLs;Independent with gait;Independent with transfers   Vocation On disability   Leisure watch TV   Cognition   Overall Cognitive Status Impaired/Different from baseline   Area of Impairment Memory   Memory Comments --  per pt's mom   Observation/Other Assessments   Focus on Therapeutic Outcomes (FOTO)  FOTO: Neuro  QOL-LE: 40.5   Sensation   Light Touch Impaired by gross assessment   Additional Comments Decreased light touch R UE/LE.   Coordination   Gross Motor Movements are Fluid and Coordinated No   Fine Motor Movements are Fluid and Coordinated No   Coordination and Movement Description Decreased speed with R UE, pt is missing 5th digit of R hand.   Posture/Postural Control   Posture/Postural Control Postural limitations   Postural Limitations Forward head   ROM / Strength   AROM / PROM / Strength AROM;Strength   AROM   Overall AROM  Deficits   Overall AROM Comments L UE/LE and R LE WFL. R shoulder limited to approx. 100 degrees.   Strength   Overall Strength Deficits   Overall Strength Comments L LE WFL. R LE: hip flexion: 4/5, knee ext: 3+/5, knee flex: 4/5, ankle dorsiflexion: difficult  to assess due to R foot pain upon palpation.   Palpation   Palpation B foot: redness and tenderness upon palpation, along with edema. Pt also reported pain, but unable to rate.   Transfers   Transfers Sit to Stand;Stand to Sit   Sit to Stand 5: Supervision;With upper extremity assist;From chair/3-in-1   Stand to Sit 5: Supervision;With upper extremity assist;To chair/3-in-1   Ambulation/Gait   Ambulation/Gait Yes   Ambulation/Gait Assistance 5: Supervision   Ambulation/Gait Assistance Details No overt LOB episodes, but pt did have to decrease speed to turn safely.   Ambulation Distance (Feet) 100 Feet   Assistive device None   Gait Pattern Step-through pattern;Decreased stride length;Decreased weight shift to right;Trendelenburg  L foot IR during stance   Ambulation Surface Level;Indoor   Gait velocity 2.59ft/sec.   Balance   Balance Assessed Yes   Static Standing Balance   Static Standing - Balance Support No upper extremity supported   Static Standing - Level of Assistance 4: Min assist;Other (comment)  min guard   Static Standing - Comment/# of Minutes Feet apart: 30 seconds with no LOB, feet together for 15 seconds with increased postural sway, tandem stance: unable to perform without MIN A with L foot in front and could hold for 4 seconds with R foot in front; SLS: unable to maintain balance without MIN A.   Standardized Balance Assessment   Standardized Balance Assessment Timed Up and Go Test   Timed Up and Go Test   TUG Normal TUG   Normal TUG (seconds) 11.72                           PT Education - 08/20/14 0957    Education provided Yes   Education Details PT frequency/duration, discussed Medicaid authorization pending and limited visits. PT explained we need MD note clearing pt for PT with history of DVT and red/painful feet   Person(s) Educated Patient;Parent(s)   Methods Explanation   Comprehension Verbalized understanding          PT Short  Term Goals - 08/20/14 1002    PT SHORT TERM GOAL #1   Title Same as LTGs.           PT Long Term Goals - 08/20/14 1002    PT LONG TERM GOAL #1   Title Be independent in HEP to improve strength, balance, and safety: Target date: 09/17/14.   Baseline No HEP.   Status New   PT LONG TERM GOAL #2   Title Pt will ambulate 500' over even/uneve terrain with LRAD at MOD  I level to improve functional mobility. Target date: 09/17/14.   Baseline Pt ambulates 200' over even terrain without AD, without LOB episodes. Target date: 09/17/14.   Status New   PT LONG TERM GOAL #3   Title Perform BERG scale and write LTG goal. Target date: 09/17/14.   Baseline No score, pt required min A to maintain balance during SLS.   Status New   PT LONG TERM GOAL #4   Title Pt will improve gait speed with LRAD to >/=2.262ft/sec. to ambulate safely in the community. Target date: 09/17/14.   Baseline 2.1034ft/sec. no AD   Status New   PT LONG TERM GOAL #5   Title Pt will improve Neuro QOL:LE by 10 points to improve quality of life. Target date: 09/17/14.   Status New               Plan - 08/20/14 0810    Clinical Impression Statement Pt is a 43y/o male presenting to [facility] neuro with R-sided weakness, R sided N/T, impaired balanace, and difficulty walking. Pt with history of TBI on 08/23/14, after falling due to a seizure. Pt with history of aphasia, he can occausionally answer "no" but typicially speech consists of "datatata". Pt's mother, Harriett Sineancy, provided history.  Pt was in the hospital for approx. 4 months after TBI, and received rehab.  Pt's mom reported she has had to catch him to prevent pt from falling about 6 times in the last 6 months, primarily when pt is getting out of tub.  Pt's gait speed indicates he is not able to safely ambulate in the community and he requires UE support or min A to maintain balance during tandem and SLS activities which increases his risk for falls. Pt had difficulty following commands  and required cues to attend to task.    Pt will benefit from skilled therapeutic intervention in order to improve on the following deficits Abnormal gait;Increased edema;Decreased safety awareness;Decreased endurance;Decreased balance;Decreased mobility;Decreased strength;Decreased cognition;Pain;Decreased knowledge of use of DME   Rehab Potential Fair   Clinical Impairments Affecting Rehab Potential Cognition   PT Frequency 2x / week   PT Duration 4 weeks   PT Treatment/Interventions ADLs/Self Care Home Management;DME Instruction;Balance training;Manual techniques;Therapeutic exercise;Therapeutic activities;Patient/family education;Functional mobility training;Biofeedback;Stair training;Neuromuscular re-education;Gait training   PT Next Visit Plan BERG balance test, initiate balance/strength HEP   Consulted and Agree with Plan of Care Patient;Family member/caregiver   Family Member Consulted pt's mom-Nancy         Problem List Patient Active Problem List   Diagnosis Date Noted  . Localization-related symptomatic epilepsy and epileptic syndromes with complex partial seizures, not intractable, without status epilepticus 08/16/2014  . Tobacco abuse 08/16/2014  . AKI (acute kidney injury) 06/25/2014  . Dysphagia 06/25/2014  . Centrilobular emphysema 01/27/2014  . Tracheostomy in place 01/27/2014  . Encounter for family conference with patient present 01/23/2014  . Dvt femoral (deep venous thrombosis) 01/05/2014  . Protein-calorie malnutrition, severe 01/05/2014  . Alcohol abuse 01/05/2014  . Acute respiratory failure with hypoxia 12/20/2013  . Delirium due to general medical condition 12/13/2013  . HCAP (healthcare-associated pneumonia) 12/13/2013  . Sepsis 12/13/2013  . TBI (traumatic brain injury) 12/12/2013    Taz Vanness L 08/20/2014, 10:06 AM  [facility] [address] [place], [place], [address] Phone:  [phone]   Fax:  [phone]    Zerita BoersJennifer Akyia Borelli, PT,DPT 08/20/2014 10:06 AM Phone: [phone] Fax: [phone]

## 2014-08-20 NOTE — Patient Instructions (Signed)
Please see primary care doctor regarding swollen, red, painful feet. Due to patient's history of DVT, PT will need a note clearing patient for physical therapy before next appointment.

## 2014-08-26 ENCOUNTER — Telehealth: Payer: Self-pay | Admitting: *Deleted

## 2014-08-26 NOTE — Telephone Encounter (Signed)
He has to address that with his primary or whoever is prescribing him that medication.  I don't prescribe his bromocriptine.

## 2014-08-26 NOTE — Telephone Encounter (Signed)
Patient parents are aware He has to address that with his primary or whoever is prescribing him that medication. I don't prescribe his bromocriptine.

## 2014-08-26 NOTE — Telephone Encounter (Signed)
See below and advise

## 2014-08-26 NOTE — Telephone Encounter (Signed)
Patients mother called he has been very shaky he is takingBromocriptine 2.5 mg 4 time a day. She would like to know if there is anything else he can do Call back number [phone]

## 2014-08-30 ENCOUNTER — Telehealth: Payer: Self-pay | Admitting: Neurology

## 2014-08-30 NOTE — Telephone Encounter (Signed)
See previous phone message. Alesia BandaLeanne w/ [facility] called. She is concerned regarding patient's shakiness. Gordan PaymentAdv Leanne that Susie spoke w/ mom on Monday and advised they need to call the office that has prescribed his Parlodel. Alesia BandaLeanne says it is her understanding that the concern is not that the Parlodel has caused his shaking but it is that he is shaking again after seeing some improvement. She has asked that we call and speak with mom again / Sherri

## 2014-08-30 NOTE — Telephone Encounter (Signed)
I advised this patient's mother as per Dr Everlena CooperJaffe that the medication Parlodel was not prescribed my Dr Everlena CooperJaffe he treats him for seizure like activity I advised her to contact the pharmacy and find out who prescribed this medication for patient and they should be the one to control this . The mother states that the Parlodel is the medication that she feels like is making him shake

## 2014-09-03 ENCOUNTER — Ambulatory Visit: Payer: Medicaid Other

## 2014-09-04 ENCOUNTER — Ambulatory Visit: Payer: Self-pay | Admitting: Physical Therapy

## 2014-09-11 ENCOUNTER — Ambulatory Visit: Payer: Medicaid Other | Attending: Neurology

## 2014-09-11 DIAGNOSIS — R269 Unspecified abnormalities of gait and mobility: Secondary | ICD-10-CM | POA: Insufficient documentation

## 2014-09-11 NOTE — Therapy (Addendum)
[facility] [address] [place], [place], [address] Phone: [phone]   Fax:  [phone]  Physical Therapy Treatment  Patient Details  Name: Hector [name] MRN: [id number] Date of Birth: December 11, 1970 Referring Provider:  Alonna Buckler, MD  Encounter Date: 09/11/2014      PT End of Session - 09/11/14 1152    Visit Number 2   Number of Visits 9   Date for PT Re-Evaluation 09/19/14   Authorization Type Medicaid and Medicare (pending)   PT Start Time 1101   PT Stop Time 1143   PT Time Calculation (min) 42 min   Equipment Utilized During Treatment Gait belt   Activity Tolerance Patient tolerated treatment well   Behavior During Therapy Impulsive      Past Medical History  Diagnosis Date  . TBI (traumatic brain injury)     07/2013, fall from standing post seizure  . Seizure   . DVT (deep venous thrombosis)     UE, LE DVT during hospitalization  . H/O renal calculi   . Cirrhosis   . Depression   . GERD (gastroesophageal reflux disease)     Past Surgical History  Procedure Laterality Date  . Tracheostomy      feinstein  . Gastrostomy tube placement    . Removal of gastrostomy tube      There were no vitals filed for this visit.  Visit Diagnosis:  Abnormality of gait - Plan: PT plan of care cert/re-cert      Subjective Assessment - 09/11/14 1105    Subjective Pt was in the hospital since last visit and MD wrote a note clearing pt for PT. Pt's was in hospital for swelling in legs but according to pt's mom they did not find any cause for swelling. Pt's mom reported pt is approved for Medicare, and they will bring the card once they obtain card.    Patient is accompained by: Family member  Hector [name]   Pertinent History Seizure history, TBI 07/2013, B foot redness/edema/pain, aphasia, DVT   Patient Stated Goals Improve balance   Currently in Pain? No/denies                          [facility] Adult PT Treatment/Exercise - 09/11/14 1116    Ambulation/Gait   Ambulation/Gait Yes   Ambulation/Gait Assistance 5: Supervision;4: Min guard   Ambulation/Gait Assistance Details Cues to decrease scissoring gait pattern. One LOB noted during 180 turns.   Ambulation Distance (Feet) 150 Feet  10x3 while performing turns   Assistive device None   Gait Pattern Step-through pattern;Decreased stride length;Decreased weight shift to right;Trendelenburg;Narrow base of support   Ambulation Surface Level;Indoor   Gait velocity 2.43f/sec.   Standardized Balance Assessment   Standardized Balance Assessment Berg Balance Test;Timed Up and Go Test   Berg Balance Test   Sit to Stand Able to stand without using hands and stabilize independently   Standing Unsupported Able to stand safely 2 minutes   Sitting with Back Unsupported but Feet Supported on Floor or Stool Able to sit safely and securely 2 minutes   Stand to Sit Sits safely with minimal use of hands   Transfers Able to transfer safely, minor use of hands   Standing Unsupported with Eyes Closed Able to stand 10 seconds with supervision   Standing Ubsupported with Feet Together Able to place feet together independently and stand for 1 minute with supervision   From Standing,  Reach Forward with Outstretched Arm Can reach forward >12 cm safely (5")   From Standing Position, Pick up Object from Floor Able to pick up shoe, needs supervision   From Standing Position, Turn to Look Behind Over each Shoulder Looks behind one side only/other side shows less weight shift   Turn 360 Degrees Able to turn 360 degrees safely one side only in 4 seconds or less   Standing Unsupported, Alternately Place Feet on Step/Stool Able to complete >2 steps/needs minimal assist   Standing Unsupported, One Foot in Front Able to take small step independently and hold 30 seconds   Standing on One Leg Tries to lift leg/unable to hold 3 seconds but remains standing  independently   Total Score 42   Timed Up and Go Test   TUG Normal TUG   Normal TUG (seconds) 12.31  no AD    Increased time required during all activities to allow processing time for pt.            PT Education - 09/11/14 1151    Education provided Yes   Education Details PT educated pt on BERG, TUG and gait speed. PT also discussed adding 4 more weeks of PT, as pt did not received PT since initial eval due to hospitalization.   Person(s) Educated Patient;Parent(s)   Methods Explanation   Comprehension Verbalized understanding          PT Short Term Goals - 08/20/14 1002    PT SHORT TERM GOAL #1   Title Same as LTGs.           PT Long Term Goals - 09/11/14 1421    PT LONG TERM GOAL #1   Title Be independent in HEP to improve strength, balance, and safety: Target date: 09/17/14.   Baseline No HEP.   Status On-going   PT LONG TERM GOAL #2   Title Pt will ambulate 500' over even/uneve terrain with LRAD at MOD I level to improve functional mobility. Target date: 09/17/14.   Baseline Pt ambulates 200' over even terrain without AD, without LOB episodes. Target date: 09/17/14.   Status Not Met   PT LONG TERM GOAL #3   Title Perform BERG scale and write LTG goal. Target date: 09/17/14.   Baseline No score, pt required min A to maintain balance during SLS.   Status Achieved   PT LONG TERM GOAL #4   Title Pt will improve gait speed with LRAD to >/=2.74f/sec. to ambulate safely in the community. Target date: 09/17/14.   Baseline 2.339fsec. no AD   Status Not Met   PT LONG TERM GOAL #5   Title Pt will improve Neuro QOL:LE by 10 points to improve quality of life. Target date: 09/17/14.   Status On-going   Additional Long Term Goals   Additional Long Term Goals Yes   PT LONG TERM GOAL #6   Title Pt will improve BERG score to >/=46/56 to decrease falls risk. Target date: 10/17/14.   Baseline 42/56   Status New               Plan - 09/11/14 1417    Clinical  Impression Statement PT performed BERG, gait speed, and TUG tests as pt has not been seen since initial eval due to hospitalization. Pt's BERG score indicates he is at risk for falls. Pt's gait speed indicates he is not a safe community ambulator. Pt also noted to ambulate with narrow BOS and occasional scissoring gait , which caused impaired  balance. Pt continues to have difficulty understanding and speaking due to aphasia, however, pt was able to follow commands better as session progressed. PT is requesting additonal 4 weeks, due to recent hospitalization and he was unable to attend PT after initial eval. All unmet goals will be carried over to new POC. Continue with POC.   Pt will benefit from skilled therapeutic intervention in order to improve on the following deficits Abnormal gait;Increased edema;Decreased safety awareness;Decreased endurance;Decreased balance;Decreased mobility;Decreased strength;Decreased cognition;Pain;Decreased knowledge of use of DME   Rehab Potential Fair   Clinical Impairments Affecting Rehab Potential Cognition   PT Frequency 2x / week   PT Duration --  8 weeks (original 4 weeks)   PT Treatment/Interventions ADLs/Self Care Home Management;DME Instruction;Balance training;Manual techniques;Therapeutic exercise;Therapeutic activities;Patient/family education;Functional mobility training;Biofeedback;Stair training;Neuromuscular re-education;Gait training   PT Next Visit Plan initiate balance/strength HEP   Consulted and Agree with Plan of Care Patient;Family member/caregiver   Family Member Consulted pt's mom-Nancy          G-Codes - 2014-09-20 1521    Functional Assessment Tool Used BERG: 42/56; gait speed: 2.58f/sec.; TUG: 12.31sec.  completed as pt's mom said he now has Medicare, waiting on card   Functional Limitation Mobility: Walking and moving around   Mobility: Walking and Moving Around Current Status [phone] At least 20 percent but less than 40 percent  impaired, limited or restricted   Mobility: Walking and Moving Around Goal Status [phone] At least 1 percent but less than 20 percent impaired, limited or restricted      Problem List Patient Active Problem List   Diagnosis Date Noted  . Localization-related symptomatic epilepsy and epileptic syndromes with complex partial seizures, not intractable, without status epilepticus 08/16/2014  . Tobacco abuse 08/16/2014  . AKI (acute kidney injury) 06/25/2014  . Dysphagia 06/25/2014  . Centrilobular emphysema 01/27/2014  . Tracheostomy in place 01/27/2014  . Encounter for family conference with patient present 01/23/2014  . Dvt femoral (deep venous thrombosis) 01/05/2014  . Protein-calorie malnutrition, severe 01/05/2014  . Alcohol abuse 01/05/2014  . Acute respiratory failure with hypoxia 12/20/2013  . Delirium due to general medical condition 12/13/2013  . HCAP (healthcare-associated pneumonia) 12/13/2013  . Sepsis 12/13/2013  . TBI (traumatic brain injury) 12/12/2013    Khalib Fendley L 5May 27, 2016 3:23 PM  [place] [address]Phone: [phone]  Fax:  [phone]    JGeoffry Paradise PT,DPT 005/27/163:23 PM Phone: [phone]: [phone] Added G-code as pt's mom reported pt now has Medicare, she will bring his card once they receive it. JGeoffry Paradise PT,DPT 005/27/20163:23 PM Phone: [phone]: [phone]

## 2014-09-13 ENCOUNTER — Ambulatory Visit: Payer: Medicaid Other

## 2014-09-13 DIAGNOSIS — R269 Unspecified abnormalities of gait and mobility: Secondary | ICD-10-CM | POA: Diagnosis not present

## 2014-09-13 NOTE — Patient Instructions (Signed)
Perform walking balance exercises at counter, and feet together exercises in corner with chair in front of you for safety:  Walking on Heels   Hold onto counter. Walk on heels for _10__ feet while continuing on a straight path. Repeat 4 times. Do _1__ sessions per day.  Copyright  VHI. All rights reserved.  Walking on [employer]   Hold onto counter. Walk on toes for __10__ feet while continuing on a straight path. Repeat 4 times. Do _1___ sessions per day.  Copyright  VHI. All rights reserved.  Side-Stepping   Walk to left side (10 feet) with eyes open. Take even steps, leading with same foot. Make sure each foot lifts off the floor. Repeat in opposite direction. Only hold counter if you need to. Repeat for __4__ times per session. Do __1__ sessions per day.   Copyright  VHI. All rights reserved.  Backward   Walk 10 feet backwards with eyes open. Take even steps, making sure each foot lifts off floor. Repeat for __4__ timesper session. Do _1___ sessions per day.   Copyright  VHI. All rights reserved.  Side to Side Head Motion   Perform without assistive device. Walking on solid surface, turn head and eyes to left for __2__ steps. Then, turn head and eyes straight ahead for _2___ steps. Then, turn head and eyes to opposite side for __2__ steps. Repeat sequence __4__ times per session. Do __1__ sessions per day.   Copyright  VHI. All rights reserved.  Up / Down Head Motion   Perform without assistive device, but hold onto counter. Walking on solid surface for about 10 feet, move head and eyes toward ceiling for __2__ steps. Then, move head and eyes straight ahead for __2__ steps. Then, move head and eyes toward floor for _2___ steps. Repeat __4__ times per session. Do __1__ sessions per day.   Copyright  VHI. All rights reserved.    Copyright  VHI. All rights reserved.  Feet Together, Varied Arm Positions - Eyes Closed   Stand with feet together and arms at your  side. Close eyes and visualize upright position. Hold __15-30__ seconds. Repeat __3__ times per session. Do __1__ sessions per day.  Copyright  VHI. All rights reserved.  Feet Together, Head Motion - Eyes Open   With eyes open, feet together, move head slowly: up and down and side to side for 30 seconds. Repeat __3__ times per session. Do __1__ sessions per day.  Copyright  VHI. All rights reserved.

## 2014-09-13 NOTE — Therapy (Signed)
[facility] [address] [place], [place], [address] Phone: [phone]   Fax:  [phone]  Physical Therapy Treatment  Patient Details  Name: Hector [name] MRN: [id number] Date of Birth: 22-Mar-1971 Referring Provider:  Alonna Buckler, MD  Encounter Date: 09/13/2014      PT End of Session - 09/13/14 1309    Visit Number 3   Number of Visits 9   Date for PT Re-Evaluation 09/19/14   Authorization Type Medicaid   PT Start Time 1147   PT Stop Time 1229   PT Time Calculation (min) 42 min   Equipment Utilized During Treatment Gait belt   Activity Tolerance Patient tolerated treatment well   Behavior During Therapy Impulsive      Past Medical History  Diagnosis Date  . TBI (traumatic brain injury)     07/2013, fall from standing post seizure  . Seizure   . DVT (deep venous thrombosis)     UE, LE DVT during hospitalization  . H/O renal calculi   . Cirrhosis   . Depression   . GERD (gastroesophageal reflux disease)     Past Surgical History  Procedure Laterality Date  . Tracheostomy      feinstein  . Gastrostomy tube placement    . Removal of gastrostomy tube      There were no vitals filed for this visit.  Visit Diagnosis:  Abnormality of gait    Neuro re-ed: Performed in corner with chair in front of pt for safety: 2-3 sets per activity with B LEs, with 10-30 second holds. No UE support, except for 1 UE support during tandem and single leg stance. All on non-compliant surface, unless otherwise noted. -Feet apart/together with eyes open/closed, feet apart/together while performing head turns, tandem stance, modified tandem stance, single leg stance. VC's and demonstration for technique. Increased postural sway noted with eyes closed and narrow BOS.  Performed at counter with 0-1 UE support and cues/demonstration for technique. 8x7'/activity Sidestepping, backwards ambulation, hip marches,  forward amb with head turns. Cues to improve stride length and decrease narrow BOS and to improve weight shifting. Pt require two seated rest breaks 2/2 fatigue. All balance performed with min guard to supervision.                             PT Education - 09/13/14 1308    Education provided Yes   Education Details Balance HEP. PT educated pt and pt's mom on checking with Medicaid prior to seeing Speech, as pt's mom reported pt does not have Medicare like she thought, only Medicaid. PT explained that Medicaid may not cover both Speech and PT, they want Speech if this is true.   Person(s) Educated Patient;Parent(s)   Methods Explanation   Comprehension Verbalized understanding          PT Short Term Goals - 08/20/14 1002    PT SHORT TERM GOAL #1   Title Same as LTGs.           PT Long Term Goals - 09/13/14 1311    PT LONG TERM GOAL #1   Title Be independent in HEP to improve strength, balance, and safety: Target date: 10/17/14.   Baseline No HEP. All goals Revised to 10/17/14 due to pt's hospitalization.   Status On-going   PT LONG TERM GOAL #2   Title Pt will ambulate 500' over even/uneve terrain with LRAD at MOD I  level to improve functional mobility. Target date: 10/17/14.   Baseline Pt ambulates 200' over even terrain without AD, without LOB episodes. Target date: 09/17/14.   Status Not Met   PT LONG TERM GOAL #3   Title Perform BERG scale and write LTG goal. Target date: 09/17/14.   Baseline No score, pt required min A to maintain balance during SLS.   Status Achieved   PT LONG TERM GOAL #4   Title Pt will improve gait speed with LRAD to >/=2.50f/sec. to ambulate safely in the community. Target date: 6/23//16.   Baseline 2.38fsec. no AD   Status Not Met   PT LONG TERM GOAL #5   Title Pt will improve Neuro QOL:LE by 10 points to improve quality of life. Target date: 6/23//16.   Status On-going   PT LONG TERM GOAL #6   Title Pt will improve BERG  score to >/=46/56 to decrease falls risk. Target date: 10/17/14.   Baseline 42/56   Status On-going               Plan - 09/13/14 1310    Clinical Impression Statement Pt demonstrated progress towards goals, as he tolerated balance HEP well and required less cues during second and third set of exercises. Pt requires increased time during exercises to allow processing and understanding. Continue with POC.   Pt will benefit from skilled therapeutic intervention in order to improve on the following deficits Abnormal gait;Increased edema;Decreased safety awareness;Decreased endurance;Decreased balance;Decreased mobility;Decreased strength;Decreased cognition;Pain;Decreased knowledge of use of DME   Rehab Potential Fair   Clinical Impairments Affecting Rehab Potential Cognition   PT Frequency 2x / week   PT Duration 8 weeks   PT Treatment/Interventions ADLs/Self Care Home Management;DME Instruction;Balance training;Manual techniques;Therapeutic exercise;Therapeutic activities;Patient/family education;Functional mobility training;Biofeedback;Stair training;Neuromuscular re-education;Gait training   PT Next Visit Plan initiate strength HEP   Consulted and Agree with Plan of Care Patient;Family member/caregiver   Family Member Consulted pt's mom-Nancy        Problem List Patient Active Problem List   Diagnosis Date Noted  . Localization-related symptomatic epilepsy and epileptic syndromes with complex partial seizures, not intractable, without status epilepticus 08/16/2014  . Tobacco abuse 08/16/2014  . AKI (acute kidney injury) 06/25/2014  . Dysphagia 06/25/2014  . Centrilobular emphysema 01/27/2014  . Tracheostomy in place 01/27/2014  . Encounter for family conference with patient present 01/23/2014  . Dvt femoral (deep venous thrombosis) 01/05/2014  . Protein-calorie malnutrition, severe 01/05/2014  . Alcohol abuse 01/05/2014  . Acute respiratory failure with hypoxia 12/20/2013   . Delirium due to general medical condition 12/13/2013  . HCAP (healthcare-associated pneumonia) 12/13/2013  . Sepsis 12/13/2013  . TBI (traumatic brain injury) 12/12/2013    Kimesha Claxton L 09/13/2014, 1:13 PM  [place]one: [phone] Fax:  [phone]   JeGeoffry ParadisePT,DPT 09/13/2014 1:13 PM Phone: [phone]: [phone]

## 2014-09-18 ENCOUNTER — Ambulatory Visit: Payer: Medicaid Other | Admitting: Physical Therapy

## 2014-09-18 DIAGNOSIS — R269 Unspecified abnormalities of gait and mobility: Secondary | ICD-10-CM | POA: Diagnosis not present

## 2014-09-18 NOTE — Patient Instructions (Signed)
FLEXION: Sitting (Active)   Sit, both feet flat. Lift right knee toward ceiling. Use 2 lbs. Complete 2 sets of 10 repetitions. Perform 2 sessions per day.  Copyright  VHI. All rights reserved.    Functional Quadriceps: Sit to Stand   Sit on edge of chair, feet flat on floor. Stand upright, extending knees fully. Repeat 10 times per set. Do 2 sets per session. Do 2 sessions per day.  http://orth.exer.us/734   Copyright  VHI. All rights reserved.   EXTENSION: Sitting (Active)   Sit with feet flat. Straighten right knee. Use 2 lbs. Complete 2 sets of 10 repetitions. Perform 2 sessions per day.  http://gtsc.exer.us/268   Copyright  VHI. All rights reserved.   "I love a Parade" Lift   Use 2 lb weight.  Hold counter for support.  March in place as high as you can. Repeat 10 times. Do 2 sets.  Do 2 sessions per day.  http://gt2.exer.us/344   Copyright  VHI. All rights reserved.  EXTENSION: Standing (Active)   Hold counter for support.  Stand, both feet flat. Draw right leg behind body as far as possible. Use 2 lbs. Complete 2 sets of 10 repetitions. Perform 2 sessions per day.  http://gtsc.exer.us/76   Copyright  VHI. All rights reserved.   HIP: Abduction - Standing (Weight)   Hold counter for support.  Place 2 lb weight around leg. Squeeze glutes. Raise leg out and slightly back.  10 reps for 2 set, 2 sets per day.  Copyright  VHI. All rights reserved.   Heel Raise: Bilateral (Standing)   Hold counter for support.  Rise on balls of feet. Repeat 10 times per set. Do 2 sets per session. Do 2 sessions per day.  http://orth.exer.us/38   Copyright  VHI. All rights reserved.

## 2014-09-19 NOTE — Therapy (Addendum)
[facility] [address] [place], [place], [address] Phone: [phone]   Fax:  [phone]  Physical Therapy Treatment  Patient Details  Name: Hector [name] MRN: [id number] Date of Birth: July 17, 1970 Referring Provider:  Alonna Buckler, MD  Encounter Date: 09/18/2014      PT End of Session - 09/18/14 1338    Visit Number 4   Number of Visits 9   Date for PT Re-Evaluation 09/19/14   Authorization Type Medicaid   PT Start Time 1104   PT Stop Time 1146   PT Time Calculation (min) 42 min   Equipment Utilized During Treatment Gait belt   Activity Tolerance Patient tolerated treatment well   Behavior During Therapy Impulsive      Past Medical History  Diagnosis Date  . TBI (traumatic brain injury)     07/2013, fall from standing post seizure  . Seizure   . DVT (deep venous thrombosis)     UE, LE DVT during hospitalization  . H/O renal calculi   . Cirrhosis   . Depression   . GERD (gastroesophageal reflux disease)     Past Surgical History  Procedure Laterality Date  . Tracheostomy      feinstein  . Gastrostomy tube placement    . Removal of gastrostomy tube      There were no vitals filed for this visit.  Visit Diagnosis:  Abnormality of gait      Subjective Assessment - 09/18/14 1323    Subjective Pts mom states pt continues with itching of Bil LE's and now has "rash" on his hands.  Reports she can not get pt to do his exercises at home.   Patient is accompained by: Family member   Pertinent History Seizure history, TBI 07/2013, B foot redness/edema/pain, aphasia, DVT   Patient Stated Goals Improve balance   Currently in Pain? No/denies                         [facility] Adult PT Treatment/Exercise - 09/18/14 1334    Knee/Hip Exercises: Aerobic   Stationary Bike Scifit level 1.5 all 4 extremities x 3 minutes-fatigues quickly   Knee/Hip Exercises: Standing   Other Standing Knee  Exercises standing at counter with bil UE support and 2# weight for hip flexion, hip extension, hip abduction x 10 x 2 and bil heel raises x 10 x 2   Knee/Hip Exercises: Seated   Long Arc Quad Both;2 sets;10 reps;Weights   Long Arc Quad Weight 2 lbs.   Other Seated Knee Exercises bil seated hip flexion 2# weight x 10 x 2   Other Seated Knee Exercises sit<>stand x 10 for LE strengthening                PT Education - 09/18/14 1337    Education provided Yes   Education Details HEP, Medicaid visits not approved for pt, cancelling remaining appointments, optimal community fitness   Person(s) Educated Patient;Parent(s)   Methods Explanation;Demonstration;Handout   Comprehension Verbalized understanding          PT Short Term Goals - 08/20/14 1002    PT SHORT TERM GOAL #1   Title Same as LTGs.           PT Long Term Goals - 09/19/14 1009    PT LONG TERM GOAL #1   Title Be independent in HEP to improve strength, balance, and safety: Target date: 10/17/14.   Baseline No HEP.  All goals Revised to 10/17/14 due to pt's hospitalization.   Status Achieved   PT LONG TERM GOAL #2   Title Pt will ambulate 500' over even/uneve terrain with LRAD at MOD I level to improve functional mobility. Target date: 10/17/14.   Baseline Pt ambulates 200' over even terrain without AD, without LOB episodes. Target date: 09/17/14.   Status Not Met   PT LONG TERM GOAL #3   Title Perform BERG scale and write LTG goal. Target date: 09/17/14.   Baseline No score, pt required min A to maintain balance during SLS.   Status Achieved   PT LONG TERM GOAL #4   Title Pt will improve gait speed with LRAD to >/=2.57f/sec. to ambulate safely in the community. Target date: 6/23//16.   Baseline 2.364fsec. no AD   Status Not Met   PT LONG TERM GOAL #5   Title Pt will improve Neuro QOL:LE by 10 points to improve quality of life. Target date: 6/23//16.   Status Not Met   PT LONG TERM GOAL #6   Title Pt will  improve BERG score to >/=46/56 to decrease falls risk. Target date: 10/17/14.   Baseline 42/56   Status Not Met               Plan - 09/19/14 1006    Clinical Impression Statement Pt tolerated treatment well today with few rest breaks.  Mother visibly concerned over continued rash on LE's and hands.  Pt is not approved for PT visits per Je[facility]off[facility] ParadisePT.  Discussed optimal community fitness with pt and mother consisting of HEP for balance and strength, using recumbent bike at home and walking.  Discharge pt per JeGeoffry ParadisePT due to lack of insurance approval.   Rehab Potential Fair   Clinical Impairments Affecting Rehab Potential Cognition   PT Next Visit Plan Discharge from PT   Consulted and Agree with Plan of Care Patient;Family member/caregiver   Family Member Consulted pt's mom-Nancy        Problem List Patient Active Problem List   Diagnosis Date Noted  . Localization-related symptomatic epilepsy and epileptic syndromes with complex partial seizures, not intractable, without status epilepticus 08/16/2014  . Tobacco abuse 08/16/2014  . AKI (acute kidney injury) 06/25/2014  . Dysphagia 06/25/2014  . Centrilobular emphysema 01/27/2014  . Tracheostomy in place 01/27/2014  . Encounter for family conference with patient present 01/23/2014  . Dvt femoral (deep venous thrombosis) 01/05/2014  . Protein-calorie malnutrition, severe 01/05/2014  . Alcohol abuse 01/05/2014  . Acute respiratory failure with hypoxia 12/20/2013  . Delirium due to general medical condition 12/13/2013  . HCAP (healthcare-associated pneumonia) 12/13/2013  . Sepsis 12/13/2013  . TBI (traumatic brain injury) 12/12/2013    RoNarda Bonds/26/2016, 10:10 AM  [address]uW[place]hone: [phone] Fax:  33[place]5/26/2016 10:10 AM Phone: [phone]: [phone] THERAPY DISCHARGE SUMMARY  Visits from Start of Care: 4  Current functional level related to goals / functional outcomes:     PT Long Term Goals - 09/19/14 1009    PT LONG TERM GOAL #1   Title Be independent in HEP to improve strength, balance, and safety: Target date: 10/17/14.   Baseline No HEP. All goals Revised to 10/17/14 due to pt's hospitalization.   Status Achieved   PT LONG TERM GOAL #2  Title Pt will ambulate 500' over even/uneve terrain with LRAD at MOD I level to improve functional mobility. Target date: 10/17/14.   Baseline Pt ambulates 200' over even terrain without AD, without LOB episodes. Target date: 09/17/14.   Status Not Met   PT LONG TERM GOAL #3   Title Perform BERG scale and write LTG goal. Target date: 09/17/14.   Baseline No score, pt required min A to maintain balance during SLS.   Status Achieved   PT LONG TERM GOAL #4   Title Pt will improve gait speed with LRAD to >/=2.85f/sec. to ambulate safely in the community. Target date: 6/23//16.   Baseline 2.383fsec. no AD   Status Not Met   PT LONG TERM GOAL #5   Title Pt will improve Neuro QOL:LE by 10 points to improve quality of life. Target date: 6/23//16.   Status Not Met   PT LONG TERM GOAL #6   Title Pt will improve BERG score to >/=46/56 to decrease falls risk. Target date: 10/17/14.   Baseline 42/56   Status Not Met        Remaining deficits: Pt continues to experience decreased strength, balance, and endurance. Pt is being discharged from PT as he can't afford PT and insurance will not cover PT.   Education / Equipment: HEP  Plan: Patient agrees to discharge.  Patient goals were partially met. Patient is being discharged due to financial reasons.  ?????         D/c completed by PT. JeGeoffry ParadisePT,DPT 09/25/2014 8:06 AM Phone: [phone]: [phone]

## 2014-09-25 ENCOUNTER — Ambulatory Visit: Payer: Self-pay | Admitting: Physical Therapy

## 2014-09-27 ENCOUNTER — Ambulatory Visit: Payer: Medicaid Other

## 2014-10-01 ENCOUNTER — Ambulatory Visit: Payer: Medicaid Other | Admitting: Speech Pathology

## 2014-10-01 ENCOUNTER — Ambulatory Visit: Payer: Medicaid Other

## 2014-10-01 ENCOUNTER — Ambulatory Visit: Payer: Medicaid Other | Admitting: Occupational Therapy

## 2014-10-08 ENCOUNTER — Ambulatory Visit: Payer: Medicaid Other

## 2014-10-11 ENCOUNTER — Ambulatory Visit: Payer: Medicaid Other

## 2014-11-12 ENCOUNTER — Telehealth: Payer: Self-pay | Admitting: Neurology

## 2014-11-12 NOTE — Telephone Encounter (Signed)
Left message for patient to return my call.

## 2014-11-12 NOTE — Telephone Encounter (Signed)
Pt's mother Harriett Sineancy called and said that all the pt is doing is sleeping and wanted to know if he needed to see Dr Everlena CooperJaffe or go over his medication/Dawn CB# [phone]

## 2014-11-22 ENCOUNTER — Encounter (HOSPITAL_COMMUNITY): Payer: Self-pay

## 2014-11-22 ENCOUNTER — Emergency Department (HOSPITAL_COMMUNITY)
Admission: EM | Admit: 2014-11-22 | Discharge: 2014-11-22 | Disposition: A | Discharge status: Home / Self Care | Payer: Medicaid Other | Attending: Emergency Medicine | Admitting: Emergency Medicine

## 2014-11-22 ENCOUNTER — Emergency Department (HOSPITAL_COMMUNITY): Payer: Medicaid Other

## 2014-11-22 DIAGNOSIS — Z79899 Other long term (current) drug therapy: Secondary | ICD-10-CM | POA: Insufficient documentation

## 2014-11-22 DIAGNOSIS — N289 Disorder of kidney and ureter, unspecified: Secondary | ICD-10-CM | POA: Diagnosis not present

## 2014-11-22 DIAGNOSIS — Z7901 Long term (current) use of anticoagulants: Secondary | ICD-10-CM | POA: Insufficient documentation

## 2014-11-22 DIAGNOSIS — G40909 Epilepsy, unspecified, not intractable, without status epilepticus: Secondary | ICD-10-CM | POA: Insufficient documentation

## 2014-11-22 DIAGNOSIS — Z86718 Personal history of other venous thrombosis and embolism: Secondary | ICD-10-CM | POA: Diagnosis not present

## 2014-11-22 DIAGNOSIS — K219 Gastro-esophageal reflux disease without esophagitis: Secondary | ICD-10-CM | POA: Insufficient documentation

## 2014-11-22 DIAGNOSIS — Z720 Tobacco use: Secondary | ICD-10-CM | POA: Insufficient documentation

## 2014-11-22 DIAGNOSIS — F329 Major depressive disorder, single episode, unspecified: Secondary | ICD-10-CM | POA: Insufficient documentation

## 2014-11-22 DIAGNOSIS — Z87442 Personal history of urinary calculi: Secondary | ICD-10-CM | POA: Diagnosis not present

## 2014-11-22 DIAGNOSIS — R531 Weakness: Secondary | ICD-10-CM

## 2014-11-22 DIAGNOSIS — Z87828 Personal history of other (healed) physical injury and trauma: Secondary | ICD-10-CM | POA: Insufficient documentation

## 2014-11-22 LAB — CBC WITH DIFFERENTIAL/PLATELET
Basophils Absolute: 0 10*3/uL (ref 0.0–0.1)
Basophils Relative: 1 % (ref 0–1)
Eosinophils Absolute: 0.1 10*3/uL (ref 0.0–0.7)
Eosinophils Relative: 2 % (ref 0–5)
HCT: 45.3 % (ref 39.0–52.0)
Hemoglobin: 15.2 g/dL (ref 13.0–17.0)
LYMPHS PCT: 34 % (ref 12–46)
Lymphs Abs: 2.1 10*3/uL (ref 0.7–4.0)
MCH: 31.3 pg (ref 26.0–34.0)
MCHC: 33.6 g/dL (ref 30.0–36.0)
MCV: 93.4 fL (ref 78.0–100.0)
MONO ABS: 0.8 10*3/uL (ref 0.1–1.0)
MONOS PCT: 12 % (ref 3–12)
Neutro Abs: 3.1 10*3/uL (ref 1.7–7.7)
Neutrophils Relative %: 51 % (ref 43–77)
PLATELETS: 125 10*3/uL — AB (ref 150–400)
RBC: 4.85 MIL/uL (ref 4.22–5.81)
RDW: 13.4 % (ref 11.5–15.5)
WBC: 6.1 10*3/uL (ref 4.0–10.5)

## 2014-11-22 LAB — COMPREHENSIVE METABOLIC PANEL
ALBUMIN: 3.9 g/dL (ref 3.5–5.0)
ALT: 21 U/L (ref 17–63)
ANION GAP: 10 (ref 5–15)
AST: 28 U/L (ref 15–41)
Alkaline Phosphatase: 91 U/L (ref 38–126)
BUN: 25 mg/dL — ABNORMAL HIGH (ref 6–20)
CALCIUM: 9.4 mg/dL (ref 8.9–10.3)
CHLORIDE: 101 mmol/L (ref 101–111)
CO2: 30 mmol/L (ref 22–32)
Creatinine, Ser: 1.61 mg/dL — ABNORMAL HIGH (ref 0.61–1.24)
GFR calc Af Amer: 58 mL/min — ABNORMAL LOW (ref >60)
GFR calc non Af Amer: 50 mL/min — ABNORMAL LOW (ref >60)
Glucose, Bld: 98 mg/dL (ref 65–99)
Potassium: 4 mmol/L (ref 3.5–5.1)
SODIUM: 141 mmol/L (ref 135–145)
Total Bilirubin: 0.5 mg/dL (ref 0.3–1.2)
Total Protein: 8 g/dL (ref 6.5–8.1)

## 2014-11-22 LAB — LIPASE, BLOOD: Lipase: 21 U/L — ABNORMAL LOW (ref 22–51)

## 2014-11-22 LAB — AMMONIA: Ammonia: 32 umol/L (ref 9–35)

## 2014-11-22 LAB — VALPROIC ACID LEVEL: Valproic Acid Lvl: 85 ug/mL (ref 50.0–100.0)

## 2014-11-22 MED ORDER — SODIUM CHLORIDE 0.9 % IV BOLUS (SEPSIS)
1000.0000 mL | Freq: Once | INTRAVENOUS | Status: AC
  Administered 2014-11-22: 1000 mL via INTRAVENOUS

## 2014-11-22 NOTE — ED Provider Notes (Signed)
Patient seen/examined in the Emergency Department in conjunction with Midlevel Provider  Patient presents for fatigue.  Denies falls/head injury Exam : awake/alert, no distress, watching TV Plan: rehydrate and probable d/c home.     Zadie Rhine, MD 11/22/14 1728

## 2014-11-22 NOTE — Discharge Instructions (Signed)
Dehydration, Adult Dehydration is when you lose more fluids from the body than you take in. Vital organs like the kidneys, brain, and heart cannot function without a proper amount of fluids and salt. Any loss of fluids from the body can cause dehydration.  CAUSES   Vomiting.  Diarrhea.  Excessive sweating.  Excessive urine output.  Fever. SYMPTOMS  Mild dehydration  Thirst.  Dry lips.  Slightly dry mouth. Moderate dehydration  Very dry mouth.  Sunken eyes.  Skin does not bounce back quickly when lightly pinched and released.  Dark urine and decreased urine production.  Decreased tear production.  Headache. Severe dehydration  Very dry mouth.  Extreme thirst.  Rapid, weak pulse (more than 100 beats per minute at rest).  Cold hands and feet.  Not able to sweat in spite of heat and temperature.  Rapid breathing.  Blue lips.  Confusion and lethargy.  Difficulty being awakened.  Minimal urine production.  No tears. DIAGNOSIS  Your caregiver will diagnose dehydration based on your symptoms and your exam. Blood and urine tests will help confirm the diagnosis. The diagnostic evaluation should also identify the cause of dehydration. TREATMENT  Treatment of mild or moderate dehydration can often be done at home by increasing the amount of fluids that you drink. It is best to drink small amounts of fluid more often. Drinking too much at one time can make vomiting worse. Refer to the home care instructions below. Severe dehydration needs to be treated at the hospital where you will probably be given intravenous (IV) fluids that contain water and electrolytes. HOME CARE INSTRUCTIONS   Ask your caregiver about specific rehydration instructions.  Drink enough fluids to keep your urine clear or pale yellow.  Drink small amounts frequently if you have nausea and vomiting.  Eat as you normally do.  Avoid:  Foods or drinks high in sugar.  Carbonated  drinks.  Juice.  Extremely hot or cold fluids.  Drinks with caffeine.  Fatty, greasy foods.  Alcohol.  Tobacco.  Overeating.  Gelatin desserts.  Wash your hands well to avoid spreading bacteria and viruses.  Only take over-the-counter or prescription medicines for pain, discomfort, or fever as directed by your caregiver.  Ask your caregiver if you should continue all prescribed and over-the-counter medicines.  Keep all follow-up appointments with your caregiver. SEEK MEDICAL CARE IF:  You have abdominal pain and it increases or stays in one area (localizes).  You have a rash, stiff neck, or severe headache.  You are irritable, sleepy, or difficult to awaken.  You are weak, dizzy, or extremely thirsty. SEEK IMMEDIATE MEDICAL CARE IF:   You are unable to keep fluids down or you get worse despite treatment.  You have frequent episodes of vomiting or diarrhea.  You have blood or green matter (bile) in your vomit.  You have blood in your stool or your stool looks black and tarry.  You have not urinated in 6 to 8 hours, or you have only urinated a small amount of very dark urine.  You have a fever.  You faint. MAKE SURE YOU:   Understand these instructions.  Will watch your condition.  Will get help right away if you are not doing well or get worse. Document Released: 04/12/2005 Document Revised: 07/05/2011 Document Reviewed: 11/30/2010 ExitCare Patient Information 2015 [employer]. This information is not intended to replace advice given to you by your health care provider. Make sure you discuss any questions you have with your health care   provider.  

## 2014-11-22 NOTE — ED Notes (Signed)
Pt has hx of TBI.  Pt lives with parents.  He has had increased weakness in last week and has been more lethargic.  Not wanting to eat.  Father denies new cough/congestion,  fever or urinary symptoms.  Pt has limited verbal but shows some understanding to command and yes/no questions.  Father states he is at his baseline orientation.

## 2014-11-22 NOTE — ED Provider Notes (Signed)
CSN: [id number]     Arrival date & time 11/22/14  1420 History   None    Chief Complaint  Patient presents with  . Weakness  . lethargic      (Consider location/radiation/quality/duration/timing/severity/associated sxs/prior Treatment) HPI   44 year old male with history of traumatic brain injury, seizure, cirrhosis, depression, accompanied by parent to the ED for evaluation of generalized weakness. History is obtained through father who is at bedside. Patient lives with parent. For father for more than a week patient has been less active, decrease in appetite, sleeping longer, staying in bed longer, and exhibited generalized weakness. He has not noticed any other physical changes. No report of fever, chills, runny nose, sneezing, coughing, trouble breathing, chest pain, abdominal pain, nausea vomiting diarrhea, dysuria, or new rash. No change in any medication. Patient denies being depressed. Patient had a swallow test done at the GI office 3 days ago and it was normal. Father also mentioned that the office encouraged patient to cough more often which he does. Prior history of seizure but no recent seizures. Otherwise no other specific complaint.  Past Medical History  Diagnosis Date  . TBI (traumatic brain injury)     07/2013, fall from standing post seizure  . Seizure   . DVT (deep venous thrombosis)     UE, LE DVT during hospitalization  . H/O renal calculi   . Cirrhosis   . Depression   . GERD (gastroesophageal reflux disease)    Past Surgical History  Procedure Laterality Date  . Tracheostomy      feinstein  . Gastrostomy tube placement    . Removal of gastrostomy tube     Family History  Problem Relation Age of Onset  . Congestive Heart Failure Brother     Deceased age 72  . Hypercholesterolemia Mother   . Hypertension Mother    History  Substance Use Topics  . Smoking status: Current Some Day Smoker -- 0.25 packs/day for 20 years  . Smokeless tobacco: Never Used  .  Alcohol Use: No     Comment: Former ETOH abuse.  Quit Oct 2014.      Review of Systems  All other systems reviewed and are negative.     Allergies  Ativan and Lactulose  Home Medications   Prior to Admission medications   Medication Sig Start Date End Date Taking? Authorizing Provider  amantadine (SYMMETREL) 100 MG capsule Take 100 mg by mouth 2 (two) times daily.   Yes Historical Provider, MD  bromocriptine (PARLODEL) 2.5 MG tablet Take 5 mg by mouth 2 (two) times daily.   Yes Historical Provider, MD  clonazePAM (KLONOPIN) 0.5 MG tablet Take 0.5 mg by mouth 2 (two) times daily as needed for anxiety (anxiety).    Yes Historical Provider, MD  docusate sodium (COLACE) 100 MG capsule Take 100 mg by mouth 2 (two) times daily.   Yes Historical Provider, MD  escitalopram (LEXAPRO) 10 MG tablet Take 10 mg by mouth daily.   Yes Historical Provider, MD  furosemide (LASIX) 20 MG tablet Take 20 mg by mouth daily.   Yes Historical Provider, MD  levETIRAcetam (KEPPRA) 1000 MG tablet Take 1,000 mg by mouth 2 (two) times daily.  05/09/14 05/09/15 Yes Historical Provider, MD  metoprolol tartrate (LOPRESSOR) 25 MG tablet Take 12.5 mg by mouth 2 (two) times daily.   Yes Historical Provider, MD  ondansetron (ZOFRAN) 4 MG tablet Take 1 tablet (4 mg total) by mouth every 6 (six) hours. Patient taking differently: Take  4 mg by mouth 2 (two) times daily.  12/10/13  Yes Junious Silk, PA-C  oxyCODONE-acetaminophen (PERCOCET/ROXICET) 5-325 MG per tablet Take 1-2 tablets by mouth every 6 (six) hours as needed for moderate pain or severe pain. 01/22/14  Yes Mahima Pandey, MD  pantoprazole (PROTONIX) 40 MG tablet Take 40 mg by mouth daily.   Yes Historical Provider, MD  polyethylene glycol (MIRALAX / GLYCOLAX) packet Take 17 g by mouth daily as needed for mild constipation.   Yes Historical Provider, MD  QUEtiapine (SEROQUEL) 100 MG tablet Take 1 tablet (100 mg total) by mouth 3 (three) times daily. Patient taking  differently: Take 100 mg by mouth daily.  01/03/14  Yes Renae Fickle, MD  rivaroxaban (XARELTO) 20 MG TABS tablet Take 20 mg by mouth daily.   Yes Historical Provider, MD  spironolactone (ALDACTONE) 50 MG tablet Take 50 mg by mouth daily.   Yes Historical Provider, MD  valACYclovir (VALTREX) 1000 MG tablet Take 1,000 mg by mouth 2 (two) times daily.   Yes Historical Provider, MD  antiseptic oral rinse (CPC / CETYLPYRIDINIUM CHLORIDE 0.05%) 0.05 % LIQD solution 7 mLs by Mouth Rinse route 2 times daily at 12 noon and 4 pm. 06/27/14   Alison Murray, MD  chlorhexidine (PERIDEX) 0.12 % solution 15 mLs by Mouth Rinse route 2 (two) times daily. 06/27/14   Alison Murray, MD  divalproex (DEPAKOTE ER) 500 MG 24 hr tablet Take 1 tablet (500 mg total) by mouth 2 (two) times daily. 03/20/14   Drema Dallas, DO  enoxaparin (LOVENOX) 150 MG/ML injection Inject 150 mg into the skin every 12 (twelve) hours.    Historical Provider, MD  loteprednol (LOTEMAX) 0.5 % ophthalmic suspension Place 1 drop into both eyes 2 (two) times daily as needed (dry eyes).    Historical Provider, MD  magnesium hydroxide (MILK OF MAGNESIA) 400 MG/5ML suspension Take 30 mLs by mouth daily as needed for mild constipation (constipation).     Historical Provider, MD  Maltodextrin-Xanthan Gum (RESOURCE THICKENUP CLEAR) POWD Take 120 g by mouth as needed. Patient taking differently: Take 1 Can by mouth daily as needed (swallow).  06/27/14   Alison Murray, MD  Valproic Acid (DEPAKENE) 250 MG/5ML SYRP syrup Place 10 mLs (500 mg total) into feeding tube every 12 (twelve) hours. 01/01/14   Dorothea Ogle, MD   BP 115/77 mmHg  Pulse 66  Temp(Src) 97.7 F (36.5 C) (Oral)  Resp 20  SpO2 98% Physical Exam  Constitutional: He appears well-developed and well-nourished. No distress.  Caucasian male laying in bed appears to be in no acute distress, interactive, nontoxic in appearance.  HENT:  Head: Atraumatic.  Right Ear: External ear normal.  Left Ear:  External ear normal.  Nose: Nose normal.  Mouth/Throat: Oropharynx is clear and moist. No oropharyngeal exudate.  Eyes: Conjunctivae and EOM are normal. Pupils are equal, round, and reactive to light.  Neck: Neck supple.  No nuchal rigidity  Cardiovascular: Normal rate and regular rhythm.   Pulmonary/Chest: Effort normal and breath sounds normal. No respiratory distress. He has no wheezes. He exhibits no tenderness.  Abdominal: Soft. Bowel sounds are normal. He exhibits no distension. There is no tenderness. There is no rebound and no guarding.  Musculoskeletal: He exhibits no edema or tenderness.  Lymphadenopathy:    He has no cervical adenopathy.  Neurological: He is alert. He has normal strength. No cranial nerve deficit or sensory deficit. Coordination normal. GCS eye subscore is 4. GCS  verbal subscore is 5. GCS motor subscore is 6.  Skin: No rash noted.  Psychiatric: He has a normal mood and affect.  Nursing note and vitals reviewed.   ED Course  Procedures (including critical care time)  3:30 PM Patient is here to be evaluated for fatigue and generalized weakness. He has no focal neuro deficit on exam. He has no focal weakness. He does not exhibit any specific infectious symptoms. Workup initiated. Patient able to tolerate by mouth after drinking fluids at home, but eating less.  5:58 PM Patient's labs remarkable for evidence of renal insufficiency with BUN 25, creatinine 1.61. This is likely reflect dehydration. IV fluid given and now patient able to tolerate by mouth fluid. He has normal white count. No anemia. Normal valproic acid level, normal ammonia, normal lipase. Chest x-ray shows no evidence of infection.currently awaits UA result if it is normal patient can be discharged for outpatient follow-up. Care discussed with Dr. Bebe Shaggy  7:02 PM Patient is unable to urinate for a UA sample after he has urinated several times during the ER visit. Patient and family member request  to be discharged at this time. They will follow-up with her PCP for recheck. Patient aware to stay hydrated and return if symptoms worsen.  Labs Review Labs Reviewed  CBC WITH DIFFERENTIAL/PLATELET - Abnormal; Notable for the following:    Platelets 125 (*)    All other components within normal limits  COMPREHENSIVE METABOLIC PANEL - Abnormal; Notable for the following:    BUN 25 (*)    Creatinine, Ser 1.61 (*)    GFR calc non Af Amer 50 (*)    GFR calc Af Amer 58 (*)    All other components within normal limits  LIPASE, BLOOD - Abnormal; Notable for the following:    Lipase 21 (*)    All other components within normal limits  AMMONIA  VALPROIC ACID LEVEL  URINALYSIS, ROUTINE W REFLEX MICROSCOPIC (NOT AT [facility])    Imaging Review Dg Chest 2 View  11/22/2014   CLINICAL DATA:  Lethargic 1-2 weeks with loss of appetite. Traumatic brain injury 2015. Noncommunicative.  EXAM: CHEST  2 VIEW  COMPARISON:  06/25/2014 and 01/02/2014  FINDINGS: Lungs are adequately inflated with minimal linear density over the right mid to lower lung likely atelectasis and unchanged from 2015. No evidence of effusion. Cardiomediastinal silhouette is within normal. Stable mild compression fracture over the mid thoracic spine.  IMPRESSION: No active cardiopulmonary disease.   Electronically Signed   By: Elberta Fortis M.D.   On: 11/22/2014 15:46     EKG Interpretation   Date/Time:  Friday November 22 2014 14:33:57 EDT Ventricular Rate:  65 PR Interval:  134 QRS Duration: 83 QT Interval:  376 QTC Calculation: 391 R Axis:   78 Text Interpretation:  Sinus rhythm Low voltage, precordial leads rate is  slower than previous EKG Confirmed by Bebe Shaggy  MD, Dorinda Hill ([id number]) on  11/22/2014 3:41:11 PM      MDM   Final diagnoses:  Generalized weakness  Renal insufficiency    BP 111/75 mmHg  Pulse 69  Temp(Src) 97.7 F (36.5 C) (Oral)  Resp 19  SpO2 97%  I have reviewed nursing notes and vital signs. I  personally viewed the imaging tests through PACS system and agrees with radiologist's intepretation I reviewed available ER/hospitalization records through the EMR     Fayrene Helper, PA-C 11/22/14 1903  Zadie Rhine, MD 11/22/14 2259

## 2014-11-22 NOTE — ED Notes (Signed)
Pt. Was given a drink. Nurse was notified.

## 2014-12-03 ENCOUNTER — Telehealth: Payer: Self-pay | Admitting: Neurology

## 2014-12-03 NOTE — Telephone Encounter (Signed)
Pt dad lester would like to go over patient medication with someone please call [phone]

## 2014-12-03 NOTE — Telephone Encounter (Signed)
I spoke with patients father Hector [name] regarding Seroquel he is stating patient is not  Eating  Only sleeping I advised him that Dr Everlena Cooper is not prescribing this medication for the patient he should contact the Dr who is  And discuss with them his concerns

## 2015-02-06 ENCOUNTER — Emergency Department (HOSPITAL_BASED_OUTPATIENT_CLINIC_OR_DEPARTMENT_OTHER): Payer: Medicaid Other

## 2015-02-06 ENCOUNTER — Emergency Department (HOSPITAL_BASED_OUTPATIENT_CLINIC_OR_DEPARTMENT_OTHER)
Admission: EM | Admit: 2015-02-06 | Discharge: 2015-02-06 | Disposition: A | Discharge status: Home / Self Care | Payer: Medicaid Other | Attending: Emergency Medicine | Admitting: Emergency Medicine

## 2015-02-06 ENCOUNTER — Encounter (HOSPITAL_BASED_OUTPATIENT_CLINIC_OR_DEPARTMENT_OTHER): Payer: Self-pay

## 2015-02-06 DIAGNOSIS — K219 Gastro-esophageal reflux disease without esophagitis: Secondary | ICD-10-CM | POA: Diagnosis not present

## 2015-02-06 DIAGNOSIS — F329 Major depressive disorder, single episode, unspecified: Secondary | ICD-10-CM | POA: Insufficient documentation

## 2015-02-06 DIAGNOSIS — K5909 Other constipation: Secondary | ICD-10-CM

## 2015-02-06 DIAGNOSIS — R109 Unspecified abdominal pain: Secondary | ICD-10-CM | POA: Insufficient documentation

## 2015-02-06 DIAGNOSIS — G40909 Epilepsy, unspecified, not intractable, without status epilepticus: Secondary | ICD-10-CM | POA: Diagnosis not present

## 2015-02-06 DIAGNOSIS — Z7901 Long term (current) use of anticoagulants: Secondary | ICD-10-CM | POA: Diagnosis not present

## 2015-02-06 DIAGNOSIS — M6281 Muscle weakness (generalized): Secondary | ICD-10-CM | POA: Insufficient documentation

## 2015-02-06 DIAGNOSIS — Z87442 Personal history of urinary calculi: Secondary | ICD-10-CM | POA: Diagnosis not present

## 2015-02-06 DIAGNOSIS — Z86718 Personal history of other venous thrombosis and embolism: Secondary | ICD-10-CM | POA: Insufficient documentation

## 2015-02-06 DIAGNOSIS — K5900 Constipation, unspecified: Secondary | ICD-10-CM | POA: Diagnosis not present

## 2015-02-06 DIAGNOSIS — N289 Disorder of kidney and ureter, unspecified: Secondary | ICD-10-CM | POA: Insufficient documentation

## 2015-02-06 DIAGNOSIS — Z87820 Personal history of traumatic brain injury: Secondary | ICD-10-CM | POA: Insufficient documentation

## 2015-02-06 DIAGNOSIS — Z79899 Other long term (current) drug therapy: Secondary | ICD-10-CM | POA: Diagnosis not present

## 2015-02-06 DIAGNOSIS — Z720 Tobacco use: Secondary | ICD-10-CM | POA: Diagnosis not present

## 2015-02-06 DIAGNOSIS — Z792 Long term (current) use of antibiotics: Secondary | ICD-10-CM | POA: Diagnosis not present

## 2015-02-06 DIAGNOSIS — R531 Weakness: Secondary | ICD-10-CM

## 2015-02-06 HISTORY — DX: Alcohol abuse, in remission: F10.11

## 2015-02-06 LAB — COMPREHENSIVE METABOLIC PANEL
ALBUMIN: 4.3 g/dL (ref 3.5–5.0)
ALK PHOS: 84 U/L (ref 38–126)
ALT: 16 U/L — AB (ref 17–63)
AST: 22 U/L (ref 15–41)
Anion gap: 8 (ref 5–15)
BUN: 20 mg/dL (ref 6–20)
CALCIUM: 9.5 mg/dL (ref 8.9–10.3)
CHLORIDE: 103 mmol/L (ref 101–111)
CO2: 28 mmol/L (ref 22–32)
Creatinine, Ser: 1.79 mg/dL — ABNORMAL HIGH (ref 0.61–1.24)
GFR calc non Af Amer: 44 mL/min — ABNORMAL LOW (ref >60)
GFR, EST AFRICAN AMERICAN: 51 mL/min — AB (ref >60)
GLUCOSE: 119 mg/dL — AB (ref 65–99)
Potassium: 4.3 mmol/L (ref 3.5–5.1)
SODIUM: 139 mmol/L (ref 135–145)
Total Bilirubin: 0.7 mg/dL (ref 0.3–1.2)
Total Protein: 8 g/dL (ref 6.5–8.1)

## 2015-02-06 LAB — CBC WITH DIFFERENTIAL/PLATELET
BASOS PCT: 1 %
Basophils Absolute: 0 10*3/uL (ref 0.0–0.1)
EOS ABS: 0.1 10*3/uL (ref 0.0–0.7)
Eosinophils Relative: 2 %
HCT: 41.1 % (ref 39.0–52.0)
Hemoglobin: 14.4 g/dL (ref 13.0–17.0)
Lymphocytes Relative: 31 %
Lymphs Abs: 2.1 10*3/uL (ref 0.7–4.0)
MCH: 32.7 pg (ref 26.0–34.0)
MCHC: 35 g/dL (ref 30.0–36.0)
MCV: 93.2 fL (ref 78.0–100.0)
Monocytes Absolute: 0.7 10*3/uL (ref 0.1–1.0)
Monocytes Relative: 10 %
Neutro Abs: 3.7 10*3/uL (ref 1.7–7.7)
Neutrophils Relative %: 56 %
PLATELETS: 84 10*3/uL — AB (ref 150–400)
RBC: 4.41 MIL/uL (ref 4.22–5.81)
RDW: 13.7 % (ref 11.5–15.5)
WBC: 6.7 10*3/uL (ref 4.0–10.5)

## 2015-02-06 LAB — CBG MONITORING, ED: Glucose-Capillary: 117 mg/dL — ABNORMAL HIGH (ref 65–99)

## 2015-02-06 MED ORDER — SODIUM CHLORIDE 0.9 % IV BOLUS (SEPSIS)
1000.0000 mL | Freq: Once | INTRAVENOUS | Status: AC
  Administered 2015-02-06: 1000 mL via INTRAVENOUS

## 2015-02-06 NOTE — Discharge Instructions (Signed)
Weakness Weakness is a lack of strength. It may be felt all over the body (generalized) or in one specific part of the body (focal). Some causes of weakness can be serious. You may need further medical evaluation, especially if you are elderly or you have a history of immunosuppression (such as chemotherapy or HIV), kidney disease, heart disease, or diabetes. CAUSES  Weakness can be caused by many different things, including:  Infection.  Physical exhaustion.  Internal bleeding or other blood loss that results in a lack of red blood cells (anemia).  Dehydration. This cause is more common in elderly people.  Side effects or electrolyte abnormalities from medicines, such as pain medicines or sedatives.  Emotional distress, anxiety, or depression.  Circulation problems, especially severe peripheral arterial disease.  Heart disease, such as rapid atrial fibrillation, bradycardia, or heart failure.  Nervous system disorders, such as Guillain-Barr syndrome, multiple sclerosis, or stroke. DIAGNOSIS  To find the cause of your weakness, your caregiver will take your history and perform a physical exam. Lab tests or X-rays may also be ordered, if needed. TREATMENT  Treatment of weakness depends on the cause of your symptoms and can vary greatly. HOME CARE INSTRUCTIONS   Rest as needed.  Eat a well-balanced diet.  Try to get some exercise every day.  Only take over-the-counter or prescription medicines as directed by your caregiver. SEEK MEDICAL CARE IF:   Your weakness seems to be getting worse or spreads to other parts of your body.  You develop new aches or pains. SEEK IMMEDIATE MEDICAL CARE IF:   You cannot perform your normal daily activities, such as getting dressed and feeding yourself.  You cannot walk up and down stairs, or you feel exhausted when you do so.  You have shortness of breath or chest pain.  You have difficulty moving parts of your body.  You have weakness  in only one area of the body or on only one side of the body.  You have a fever.  You have trouble speaking or swallowing.  You cannot control your bladder or bowel movements.  You have black or bloody vomit or stools. MAKE SURE YOU:  Understand these instructions.  Will watch your condition.  Will get help right away if you are not doing well or get worse.   This information is not intended to replace advice given to you by your health care provider. Make sure you discuss any questions you have with your health care provider.   Document Released: 04/12/2005 Document Revised: 10/12/2011 Document Reviewed: 06/11/2011 Elsevier Interactive Patient Education 2016 [employer]  Constipation, Adult Constipation is when a person has fewer than three bowel movements a week, has difficulty having a bowel movement, or has stools that are dry, hard, or larger than normal. As people grow older, constipation is more common. A low-fiber diet, not taking in enough fluids, and taking certain medicines may make constipation worse.  CAUSES   Certain medicines, such as antidepressants, pain medicine, iron supplements, antacids, and water pills.   Certain diseases, such as diabetes, irritable bowel syndrome (IBS), thyroid disease, or depression.   Not drinking enough water.   Not eating enough fiber-rich foods.   Stress or travel.   Lack of physical activity or exercise.   Ignoring the urge to have a bowel movement.   Using laxatives too much.  SIGNS AND SYMPTOMS   Having fewer than three bowel movements a week.   Straining to have a bowel movement.   Having  stools that are hard, dry, or larger than normal.   Feeling full or bloated.   Pain in the lower abdomen.   Not feeling relief after having a bowel movement.  DIAGNOSIS  Your health care provider will take a medical history and perform a physical exam. Further testing may be done for severe constipation. Some  tests may include:  A barium enema X-ray to examine your rectum, colon, and, sometimes, your small intestine.   A sigmoidoscopy to examine your lower colon.   A colonoscopy to examine your entire colon. TREATMENT  Treatment will depend on the severity of your constipation and what is causing it. Some dietary treatments include drinking more fluids and eating more fiber-rich foods. Lifestyle treatments may include regular exercise. If these diet and lifestyle recommendations do not help, your health care provider may recommend taking over-the-counter laxative medicines to help you have bowel movements. Prescription medicines may be prescribed if over-the-counter medicines do not work.  HOME CARE INSTRUCTIONS   Eat foods that have a lot of fiber, such as fruits, vegetables, whole grains, and beans.  Limit foods high in fat and processed sugars, such as french fries, hamburgers, cookies, candies, and soda.   A fiber supplement may be added to your diet if you cannot get enough fiber from foods.   Drink enough fluids to keep your urine clear or pale yellow.   Exercise regularly or as directed by your health care provider.   Go to the restroom when you have the urge to go. Do not hold it.   Only take over-the-counter or prescription medicines as directed by your health care provider. Do not take other medicines for constipation without talking to your health care provider first.  SEEK IMMEDIATE MEDICAL CARE IF:   You have bright red blood in your stool.   Your constipation lasts for more than 4 days or gets worse.   You have abdominal or rectal pain.   You have thin, pencil-like stools.   You have unexplained weight loss. MAKE SURE YOU:   Understand these instructions.  Will watch your condition.  Will get help right away if you are not doing well or get worse.   This information is not intended to replace advice given to you by your health care provider. Make sure  you discuss any questions you have with your health care provider.   Document Released: 01/09/2004 Document Revised: 05/03/2014 Document Reviewed: 01/22/2013 Elsevier Interactive Patient Education [employer]  Your lab work indicates you have mild renal insufficiency.  A review of your lab results shows prior episodes.  Your values today are showing an upward trend, but may also be due to inadequate fluid intake and dehydration.  You have received IV hydration in the ED today.  It is important for you to take in adequate fluids at home and have the lab work repeated in one week--please follow-up with your primary care provider.

## 2015-02-06 NOTE — ED Notes (Signed)
Mother states pt with weakness x 1week-decreased po intake x 4 days-today pt became pale with unsteady gait-pt is TBI with difficult speech that mother is able to comprehend

## 2015-02-06 NOTE — ED Provider Notes (Signed)
CSN: [id number]     Arrival date & time 02/06/15  1550 History   First MD Initiated Contact with Patient 02/06/15 1603     Chief Complaint  Patient presents with  . Weakness     (Consider location/radiation/quality/duration/timing/severity/associated sxs/prior Treatment) HPI Comments: Patient with history of TBI, has ongoing speech difficulty.  History obtained primarily from his mother who is his primary care giver.  Patient with weakness, along with recent constipation, requiring an enema to clear. Today, patient became unsteady and pale upon standing from a seated position.  He did not lose consciousness. His mother states he has been having increasing difficulty swallowing, and is scheduled to see gastroenterology on Monday.  Patient is a 44 y.o. male presenting with weakness. The history is provided by the patient and a parent. The history is limited by the condition of the patient. No language interpreter was used.  Weakness This is a new problem. The current episode started in the past 7 days. The problem occurs intermittently. The problem has been waxing and waning. Associated symptoms include abdominal pain, fatigue and weakness. The symptoms are aggravated by standing.    Past Medical History  Diagnosis Date  . TBI (traumatic brain injury) (HCC)     07/2013, fall from standing post seizure  . Seizure (HCC)   . DVT (deep venous thrombosis) (HCC)     UE, LE DVT during hospitalization  . H/O renal calculi   . Cirrhosis (HCC)   . Depression   . GERD (gastroesophageal reflux disease)   . H/O ETOH abuse    Past Surgical History  Procedure Laterality Date  . Tracheostomy      feinstein  . Gastrostomy tube placement    . Removal of gastrostomy tube    . Brain surgery     Family History  Problem Relation Age of Onset  . Congestive Heart Failure Brother     Deceased age 51  . Hypercholesterolemia Mother   . Hypertension Mother    Social History  Substance Use Topics  .  Smoking status: Current Some Day Smoker -- 0.25 packs/day for 20 years  . Smokeless tobacco: Never Used  . Alcohol Use: No    Review of Systems  Constitutional: Positive for appetite change and fatigue.  Gastrointestinal: Positive for abdominal pain.  Neurological: Positive for weakness.  All other systems reviewed and are negative.     Allergies  Ativan and Lactulose  Home Medications   Prior to Admission medications   Medication Sig Start Date End Date Taking? Authorizing Provider  amantadine (SYMMETREL) 100 MG capsule Take 100 mg by mouth 2 (two) times daily.    Historical Provider, MD  bromocriptine (PARLODEL) 2.5 MG tablet Take 5 mg by mouth 2 (two) times daily.    Historical Provider, MD  chlorhexidine (PERIDEX) 0.12 % solution 15 mLs by Mouth Rinse route 2 (two) times daily. 06/27/14   Alison Murray, MD  clonazePAM (KLONOPIN) 0.5 MG tablet Take 0.5 mg by mouth 2 (two) times daily as needed for anxiety (anxiety).     Historical Provider, MD  divalproex (DEPAKOTE ER) 500 MG 24 hr tablet Take 1 tablet (500 mg total) by mouth 2 (two) times daily. 03/20/14   Drema Dallas, DO  docusate sodium (COLACE) 100 MG capsule Take 100 mg by mouth 2 (two) times daily.    Historical Provider, MD  doxycycline (VIBRAMYCIN) 100 MG capsule Take 100 mg by mouth 2 (two) times daily.    Historical Provider, MD  enoxaparin (LOVENOX) 150 MG/ML injection Inject 150 mg into the skin every 12 (twelve) hours.    Historical Provider, MD  escitalopram (LEXAPRO) 10 MG tablet Take 10 mg by mouth daily.    Historical Provider, MD  furosemide (LASIX) 20 MG tablet Take 20 mg by mouth daily.    Historical Provider, MD  levETIRAcetam (KEPPRA) 1000 MG tablet Take 1,000 mg by mouth 2 (two) times daily.  05/09/14 05/09/15  Historical Provider, MD  Linaclotide Karlene Einstein(LINZESS) 145 MCG CAPS capsule Take 145 mcg by mouth daily.    Historical Provider, MD  loteprednol (LOTEMAX) 0.5 % ophthalmic suspension Place 1 drop into both eyes  2 (two) times daily as needed (dry eyes).    Historical Provider, MD  loteprednol (LOTEMAX) 0.5 % ophthalmic suspension Place 1 drop into both eyes 2 (two) times daily as needed (dry eyes).    Historical Provider, MD  Maltodextrin-Xanthan Gum (RESOURCE THICKENUP CLEAR) POWD Take 120 g by mouth as needed. Patient not taking: Reported on 11/22/2014 06/27/14   Alison MurrayAlma M Devine, MD  metoprolol tartrate (LOPRESSOR) 25 MG tablet Take 12.5 mg by mouth 2 (two) times daily.    Historical Provider, MD  ondansetron (ZOFRAN) 4 MG tablet Take 1 tablet (4 mg total) by mouth every 6 (six) hours. Patient taking differently: Take 4 mg by mouth 2 (two) times daily.  12/10/13   Junious SilkHannah Merrell, PA-C  oxyCODONE-acetaminophen (PERCOCET/ROXICET) 5-325 MG per tablet Take 1-2 tablets by mouth every 6 (six) hours as needed for moderate pain or severe pain. 01/22/14   Oneal GroutMahima Pandey, MD  pantoprazole (PROTONIX) 40 MG tablet Take 40 mg by mouth daily.    Historical Provider, MD  polyethylene glycol (MIRALAX / GLYCOLAX) packet Take 17 g by mouth daily as needed for mild constipation.    Historical Provider, MD  QUEtiapine (SEROQUEL) 100 MG tablet Take 1 tablet (100 mg total) by mouth 3 (three) times daily. Patient taking differently: Take 200 mg by mouth at bedtime.  01/03/14   Renae FickleMackenzie Short, MD  rivaroxaban (XARELTO) 20 MG TABS tablet Take 20 mg by mouth daily.    Historical Provider, MD  spironolactone (ALDACTONE) 50 MG tablet Take 50 mg by mouth daily.    Historical Provider, MD   BP 99/69 mmHg  Pulse 79  Temp(Src) 97.6 F (36.4 C) (Oral)  Resp 18  Ht 5\' 8"  (1.727 m)  Wt 134 lb (60.782 kg)  BMI 20.38 kg/m2  SpO2 97% Physical Exam  Constitutional: He appears well-developed and well-nourished.  HENT:  Head: Normocephalic.  Eyes: Conjunctivae are normal.  Neck: Neck supple.  Cardiovascular: Normal rate and regular rhythm.   Pulmonary/Chest: Effort normal and breath sounds normal.  Abdominal: Soft. Bowel sounds are  normal. There is tenderness.  Musculoskeletal: He exhibits no edema or tenderness.  Neurological: He is alert.  Skin: Skin is warm and dry.  Psychiatric: He has a normal mood and affect.  Nursing note and vitals reviewed.   ED Course  Procedures (including critical care time) Labs Review Labs Reviewed  CBC WITH DIFFERENTIAL/PLATELET - Abnormal; Notable for the following:    Platelets 84 (*)    All other components within normal limits  COMPREHENSIVE METABOLIC PANEL - Abnormal; Notable for the following:    Glucose, Bld 119 (*)    Creatinine, Ser 1.79 (*)    ALT 16 (*)    GFR calc non Af Amer 44 (*)    GFR calc Af Amer 51 (*)    All other components within  normal limits  CBG MONITORING, ED - Abnormal; Notable for the following:    Glucose-Capillary 117 (*)    All other components within normal limits  URINALYSIS, ROUTINE W REFLEX MICROSCOPIC (NOT AT [facility])    Imaging Review Dg Abd Acute W/chest  02/06/2015  CLINICAL DATA:  Abdominal pain and weakness. Cirrhosis. Tobacco use. EXAM: DG ABDOMEN ACUTE W/ 1V CHEST COMPARISON:  11/22/2014 FINDINGS: Stable scarring stable scarring at the left lung base. Scarring or subsegmental atelectasis at the right lung base. Cardiac and mediastinal margins appear normal. No free intraperitoneal gas beneath the hemidiaphragms. No significant abnormal air- fluid levels in the upper abdomen. Prominent stool throughout the colon favors constipation. No dilated small bowel. IMPRESSION: 1.  Prominent stool throughout the colon favors constipation. 2. Left basilar scarring. Right basilar subsegmental atelectasis or scarring. Electronically Signed   By: Gaylyn Rong M.D.   On: 02/06/2015 17:41   I have personally reviewed and evaluated these images and lab results as part of my medical decision-making.   EKG Interpretation None     Patient with history of TBI seen for complaint of weakness. Has been constipated. Poor po fluid intake. Lab and  radiology results reviewed and shared with patient/family, are mostly reassuring.Marland Kitchen Upward trending renal insufficiency, may be due to dehydration and poor fluid intake. Patient given IVF in ED.  Patient feels better after fluids. Patient encouraged to drink more fluids at home. Care instructions for constipation provided. Patient to follow-up with his PCP for recheck of renal function in one week. MDM   Final diagnoses:  None    Constipation. Weakness. Mild renal insufficiency.    Felicie Morn, NP 02/06/15 2204  Melene Plan, DO 02/06/15 [phone]

## 2015-02-12 ENCOUNTER — Emergency Department (HOSPITAL_COMMUNITY): Payer: Medicaid Other

## 2015-02-12 ENCOUNTER — Encounter (HOSPITAL_COMMUNITY): Payer: Self-pay | Admitting: *Deleted

## 2015-02-12 ENCOUNTER — Observation Stay (HOSPITAL_COMMUNITY)
Admission: EM | Admit: 2015-02-12 | Discharge: 2015-02-15 | Disposition: A | Discharge status: Home / Self Care | Payer: Medicaid Other | Attending: Internal Medicine | Admitting: Internal Medicine

## 2015-02-12 DIAGNOSIS — S069X9A Unspecified intracranial injury with loss of consciousness of unspecified duration, initial encounter: Secondary | ICD-10-CM | POA: Diagnosis present

## 2015-02-12 DIAGNOSIS — G40909 Epilepsy, unspecified, not intractable, without status epilepticus: Secondary | ICD-10-CM | POA: Insufficient documentation

## 2015-02-12 DIAGNOSIS — R4182 Altered mental status, unspecified: Principal | ICD-10-CM | POA: Insufficient documentation

## 2015-02-12 DIAGNOSIS — Z79899 Other long term (current) drug therapy: Secondary | ICD-10-CM | POA: Diagnosis not present

## 2015-02-12 DIAGNOSIS — F329 Major depressive disorder, single episode, unspecified: Secondary | ICD-10-CM | POA: Diagnosis not present

## 2015-02-12 DIAGNOSIS — K7460 Unspecified cirrhosis of liver: Secondary | ICD-10-CM | POA: Insufficient documentation

## 2015-02-12 DIAGNOSIS — E860 Dehydration: Secondary | ICD-10-CM | POA: Insufficient documentation

## 2015-02-12 DIAGNOSIS — F17200 Nicotine dependence, unspecified, uncomplicated: Secondary | ICD-10-CM | POA: Insufficient documentation

## 2015-02-12 DIAGNOSIS — Z86718 Personal history of other venous thrombosis and embolism: Secondary | ICD-10-CM | POA: Diagnosis not present

## 2015-02-12 DIAGNOSIS — Z87820 Personal history of traumatic brain injury: Secondary | ICD-10-CM | POA: Insufficient documentation

## 2015-02-12 DIAGNOSIS — R270 Ataxia, unspecified: Secondary | ICD-10-CM | POA: Diagnosis not present

## 2015-02-12 DIAGNOSIS — R4701 Aphasia: Secondary | ICD-10-CM | POA: Diagnosis not present

## 2015-02-12 DIAGNOSIS — K219 Gastro-esophageal reflux disease without esophagitis: Secondary | ICD-10-CM | POA: Insufficient documentation

## 2015-02-12 DIAGNOSIS — G9340 Encephalopathy, unspecified: Secondary | ICD-10-CM | POA: Diagnosis not present

## 2015-02-12 DIAGNOSIS — R251 Tremor, unspecified: Secondary | ICD-10-CM | POA: Diagnosis not present

## 2015-02-12 DIAGNOSIS — K7030 Alcoholic cirrhosis of liver without ascites: Secondary | ICD-10-CM | POA: Insufficient documentation

## 2015-02-12 DIAGNOSIS — N179 Acute kidney failure, unspecified: Secondary | ICD-10-CM | POA: Insufficient documentation

## 2015-02-12 DIAGNOSIS — F419 Anxiety disorder, unspecified: Secondary | ICD-10-CM | POA: Insufficient documentation

## 2015-02-12 DIAGNOSIS — R1310 Dysphagia, unspecified: Secondary | ICD-10-CM | POA: Insufficient documentation

## 2015-02-12 DIAGNOSIS — Z79891 Long term (current) use of opiate analgesic: Secondary | ICD-10-CM | POA: Diagnosis not present

## 2015-02-12 DIAGNOSIS — S069XAA Unspecified intracranial injury with loss of consciousness status unknown, initial encounter: Secondary | ICD-10-CM | POA: Diagnosis present

## 2015-02-12 LAB — RAPID URINE DRUG SCREEN, HOSP PERFORMED
Amphetamines: NOT DETECTED
BENZODIAZEPINES: NOT DETECTED
Barbiturates: NOT DETECTED
Cocaine: NOT DETECTED
Opiates: NOT DETECTED
Tetrahydrocannabinol: NOT DETECTED

## 2015-02-12 LAB — CBC WITH DIFFERENTIAL/PLATELET
BASOS ABS: 0 10*3/uL (ref 0.0–0.1)
BASOS PCT: 0 %
Eosinophils Absolute: 0.2 10*3/uL (ref 0.0–0.7)
Eosinophils Relative: 3 %
HEMATOCRIT: 43.6 % (ref 39.0–52.0)
HEMOGLOBIN: 14.5 g/dL (ref 13.0–17.0)
Lymphocytes Relative: 39 %
Lymphs Abs: 2.4 10*3/uL (ref 0.7–4.0)
MCH: 32.5 pg (ref 26.0–34.0)
MCHC: 33.3 g/dL (ref 30.0–36.0)
MCV: 97.8 fL (ref 78.0–100.0)
MONO ABS: 0.6 10*3/uL (ref 0.1–1.0)
Monocytes Relative: 10 %
NEUTROS ABS: 2.9 10*3/uL (ref 1.7–7.7)
NEUTROS PCT: 48 %
Platelets: 85 10*3/uL — ABNORMAL LOW (ref 150–400)
RBC: 4.46 MIL/uL (ref 4.22–5.81)
RDW: 13.7 % (ref 11.5–15.5)
WBC: 6.1 10*3/uL (ref 4.0–10.5)

## 2015-02-12 LAB — COMPREHENSIVE METABOLIC PANEL
ALK PHOS: 81 U/L (ref 38–126)
ALT: 18 U/L (ref 17–63)
ANION GAP: 10 (ref 5–15)
AST: 38 U/L (ref 15–41)
Albumin: 4.6 g/dL (ref 3.5–5.0)
BILIRUBIN TOTAL: 1 mg/dL (ref 0.3–1.2)
BUN: 24 mg/dL — ABNORMAL HIGH (ref 6–20)
CALCIUM: 10.1 mg/dL (ref 8.9–10.3)
CO2: 30 mmol/L (ref 22–32)
Chloride: 102 mmol/L (ref 101–111)
Creatinine, Ser: 1.57 mg/dL — ABNORMAL HIGH (ref 0.61–1.24)
GFR, EST AFRICAN AMERICAN: 59 mL/min — AB (ref >60)
GFR, EST NON AFRICAN AMERICAN: 51 mL/min — AB (ref >60)
Glucose, Bld: 110 mg/dL — ABNORMAL HIGH (ref 65–99)
Potassium: 4.6 mmol/L (ref 3.5–5.1)
Sodium: 142 mmol/L (ref 135–145)
TOTAL PROTEIN: 8.3 g/dL — AB (ref 6.5–8.1)

## 2015-02-12 LAB — VALPROIC ACID LEVEL: VALPROIC ACID LVL: 98 ug/mL (ref 50.0–100.0)

## 2015-02-12 LAB — URINALYSIS, ROUTINE W REFLEX MICROSCOPIC
BILIRUBIN URINE: NEGATIVE
Glucose, UA: NEGATIVE mg/dL
Hgb urine dipstick: NEGATIVE
Ketones, ur: NEGATIVE mg/dL
Leukocytes, UA: NEGATIVE
NITRITE: NEGATIVE
PROTEIN: NEGATIVE mg/dL
SPECIFIC GRAVITY, URINE: 1.012 (ref 1.005–1.030)
UROBILINOGEN UA: 1 mg/dL (ref 0.0–1.0)
pH: 6.5 (ref 5.0–8.0)

## 2015-02-12 LAB — CBG MONITORING, ED: Glucose-Capillary: 91 mg/dL (ref 65–99)

## 2015-02-12 LAB — I-STAT CG4 LACTIC ACID, ED
LACTIC ACID, VENOUS: 0.86 mmol/L (ref 0.5–2.0)
Lactic Acid, Venous: 1.12 mmol/L (ref 0.5–2.0)

## 2015-02-12 LAB — AMMONIA: AMMONIA: 29 umol/L (ref 9–35)

## 2015-02-12 MED ORDER — SODIUM CHLORIDE 0.9 % IV BOLUS (SEPSIS)
1000.0000 mL | Freq: Once | INTRAVENOUS | Status: AC
  Administered 2015-02-12: 1000 mL via INTRAVENOUS

## 2015-02-12 MED ORDER — LIDOCAINE HCL (PF) 1 % IJ SOLN: 5.0000 mL | Freq: Once | INTRAMUSCULAR | Status: DC

## 2015-02-12 MED ORDER — LIDOCAINE HCL (PF) 1 % IJ SOLN
5.0000 mL | Freq: Once | INTRAMUSCULAR | Status: AC
  Administered 2015-02-12: 5 mL via INTRADERMAL
  Filled 2015-02-12: qty 5

## 2015-02-12 NOTE — ED Notes (Signed)
Bed: WA11 Expected date:  Expected time:  Means of arrival:  Comments: No bed 

## 2015-02-12 NOTE — ED Notes (Signed)
Nurse drawing labs. 

## 2015-02-12 NOTE — ED Provider Notes (Signed)
CSN: [id number]     Arrival date & time 02/12/15  1642 History   First MD Initiated Contact with Patient 02/12/15 1709     Chief Complaint  Patient presents with  . Altered Mental Status   Level V caveat: non-verbal patient with history of TBI and seizures.    Hector [name] is a 44 y.o. male with a history of traumatic brain injury, cirrhosis, seizure disorder, alcohol abuse, and is non-verbal who presents to the ED with his father who reports the patient has had a slow decline over the past 2 or 3 weeks. They report that he has not been walking like he normally does and that his gait and balance seem off. He also reports the patient seems more confused and asked like he does not know the direction which to walk at home. They also report he has been eating less. The patient has history of seizures and a traumatic brain injury many years ago. He has a history of DVT and was previously on Xarelto, but this was stopped approximately 6 weeks ago. They report that he was seen at [facility] 1 week ago with some constipation. The father reports her constipation is improved and his last bowel movement was today. They report he has been taking all his medications. They deny any seizure activity or loss of consciousness or falls recently. They report he has been sleeping more. They deny any changes to his urination. They deny any fevers, recent illness, coughing, complaints of pain, diarrhea, vomiting, rashes, or falls. When I ask the patient if he has any pain anywhere he shakes his head no.   (Consider location/radiation/quality/duration/timing/severity/associated sxs/prior Treatment) HPI  Past Medical History  Diagnosis Date  . TBI (traumatic brain injury) (HCC)     07/2013, fall from standing post seizure  . Seizure (HCC)   . DVT (deep venous thrombosis) (HCC)     UE, LE DVT during hospitalization  . H/O renal calculi   . Cirrhosis (HCC)   . Depression   . GERD (gastroesophageal reflux  disease)   . H/O ETOH abuse    Past Surgical History  Procedure Laterality Date  . Tracheostomy      feinstein  . Gastrostomy tube placement    . Removal of gastrostomy tube    . Brain surgery     Family History  Problem Relation Age of Onset  . Congestive Heart Failure Brother     Deceased age 49  . Hypercholesterolemia Mother   . Hypertension Mother    Social History  Substance Use Topics  . Smoking status: Current Some Day Smoker -- 0.25 packs/day for 20 years  . Smokeless tobacco: Never Used  . Alcohol Use: No    Review of Systems  Unable to perform ROS: Patient nonverbal  Constitutional: Positive for appetite change. Negative for fever.  Respiratory: Negative for cough.   Gastrointestinal: Negative for vomiting and diarrhea.  Genitourinary: Negative for difficulty urinating.  Skin: Negative for rash.  Neurological: Negative for seizures and syncope.  Psychiatric/Behavioral: Positive for confusion.      Allergies  Ativan and Lactulose  Home Medications   Prior to Admission medications   Medication Sig Start Date End Date Taking? Authorizing Provider  amantadine (SYMMETREL) 100 MG capsule Take 100 mg by mouth 2 (two) times daily.   Yes Historical Provider, MD  bromocriptine (PARLODEL) 2.5 MG tablet Take 5 mg by mouth 2 (two) times daily.   Yes Historical Provider, MD  clonazePAM (  KLONOPIN) 0.5 MG tablet Take 0.5 mg by mouth 2 (two) times daily as needed for anxiety (anxiety).    Yes Historical Provider, MD  divalproex (DEPAKOTE ER) 500 MG 24 hr tablet Take 1 tablet (500 mg total) by mouth 2 (two) times daily. Patient taking differently: Take 1,000 mg by mouth 2 (two) times daily.  03/20/14  Yes Drema Dallas, DO  docusate sodium (COLACE) 100 MG capsule Take 100 mg by mouth 2 (two) times daily.   Yes Historical Provider, MD  escitalopram (LEXAPRO) 10 MG tablet Take 10 mg by mouth daily.   Yes Historical Provider, MD  furosemide (LASIX) 20 MG tablet Take 20 mg by  mouth daily.   Yes Historical Provider, MD  levETIRAcetam (KEPPRA) 250 MG tablet Take 500 mg by mouth 2 (two) times daily.   Yes Historical Provider, MD  Linaclotide Karlene Einstein) 145 MCG CAPS capsule Take 145 mcg by mouth daily.   Yes Historical Provider, MD  metoprolol tartrate (LOPRESSOR) 25 MG tablet Take 12.5 mg by mouth 2 (two) times daily.   Yes Historical Provider, MD  ondansetron (ZOFRAN) 4 MG tablet Take 1 tablet (4 mg total) by mouth every 6 (six) hours. 12/10/13  Yes Junious Silk, PA-C  oxyCODONE-acetaminophen (PERCOCET/ROXICET) 5-325 MG per tablet Take 1-2 tablets by mouth every 6 (six) hours as needed for moderate pain or severe pain. 01/22/14  Yes Mahima Pandey, MD  pantoprazole (PROTONIX) 40 MG tablet Take 40 mg by mouth daily.   Yes Historical Provider, MD  polyethylene glycol (MIRALAX / GLYCOLAX) packet Take 17 g by mouth daily as needed for mild constipation.   Yes Historical Provider, MD  QUEtiapine (SEROQUEL) 100 MG tablet Take 1 tablet (100 mg total) by mouth 3 (three) times daily. Patient taking differently: Take 100-200 mg by mouth 3 (three) times daily. 1 tab in the morning and afternoon, 2 tabs at bedtime 01/03/14  Yes Renae Fickle, MD  spironolactone (ALDACTONE) 50 MG tablet Take 50 mg by mouth daily.   Yes Historical Provider, MD  chlorhexidine (PERIDEX) 0.12 % solution 15 mLs by Mouth Rinse route 2 (two) times daily. Patient not taking: Reported on 02/12/2015 06/27/14   Alison Murray, MD  Maltodextrin-Xanthan Gum (RESOURCE THICKENUP CLEAR) POWD Take 120 g by mouth as needed. Patient not taking: Reported on 11/22/2014 06/27/14   Alison Murray, MD   BP 101/69 mmHg  Pulse 62  Temp(Src) 98.4 F (36.9 C) (Oral)  Resp 24  SpO2 98% Physical Exam  Constitutional: He appears well-developed and well-nourished. No distress.  Nontoxic appearing.  HENT:  Head: Normocephalic and atraumatic.  Mouth/Throat: Oropharynx is clear and moist.  Eyes: Conjunctivae are normal. Pupils are  equal, round, and reactive to light. Right eye exhibits no discharge. Left eye exhibits no discharge.  Neck: Normal range of motion. Neck supple.  Patient is spontaneously moving his neck in all directions. No meningeal signs.   Cardiovascular: Normal rate, regular rhythm, normal heart sounds and intact distal pulses.  Exam reveals no gallop and no friction rub.   No murmur heard. Pulmonary/Chest: Effort normal and breath sounds normal. No respiratory distress. He has no wheezes. He has no rales.  Abdominal: Soft. There is no tenderness. There is no guarding.  Musculoskeletal: He exhibits no edema.  No lower extremity edema or tenderness. Patient is spontaneously moving all extremities exhibiting good strength.    Lymphadenopathy:    He has no cervical adenopathy.  Neurological: He is alert. Coordination normal.  The patient is  alert. The patient will follow most of my commands. The patient is able to ambulate albeit with unsteady gait.  Skin: Skin is warm and dry. No rash noted. He is not diaphoretic. No erythema. No pallor.  Psychiatric: He has a normal mood and affect. His behavior is normal.  Nursing note and vitals reviewed.   ED Course  .Lumbar Puncture Date/Time: 02/12/2015 11:00 PM Performed by: Tyrone Apple ROE Authorized by: Leta Baptist Consent: Verbal consent obtained. Written consent obtained. Risks and benefits: risks, benefits and alternatives were discussed Consent given by: patient and parent Patient understanding: patient states understanding of the procedure being performed Patient consent: the patient's understanding of the procedure matches consent given Procedure consent: procedure consent matches procedure scheduled Relevant documents: relevant documents present and verified Test results: test results available and properly labeled Site marked: the operative site was marked Required items: required blood products, implants, devices, and special equipment  available Patient identity confirmed: arm band Time out: Immediately prior to procedure a "time out" was called to verify the correct patient, procedure, equipment, support staff and site/side marked as required. Indications: evaluation for altered mental status Anesthesia: local infiltration Local anesthetic: lidocaine 1% without epinephrine Anesthetic total: 5 ml Patient sedated: no Preparation: Patient was prepped and draped in the usual sterile fashion. Lumbar space: L3-L4 interspace Patient's position: left lateral decubitus Needle gauge: 18 Needle type: spinal needle - Quincke tip Needle length: 3.5 in Number of attempts: 5 or more Post-procedure: site cleaned and adhesive bandage applied Patient tolerance: Patient tolerated the procedure well with no immediate complications Comments: LP was not successful. Was not able to obtain spinal fluid after multiple attempts.    (including critical care time) Labs Review Labs Reviewed  COMPREHENSIVE METABOLIC PANEL - Abnormal; Notable for the following:    Glucose, Bld 110 (*)    BUN 24 (*)    Creatinine, Ser 1.57 (*)    Total Protein 8.3 (*)    GFR calc non Af Amer 51 (*)    GFR calc Af Amer 59 (*)    All other components within normal limits  CBC WITH DIFFERENTIAL/PLATELET - Abnormal; Notable for the following:    Platelets 85 (*)    All other components within normal limits  CSF CULTURE  GRAM STAIN  URINALYSIS, ROUTINE W REFLEX MICROSCOPIC (NOT AT [facility])  AMMONIA  URINE RAPID DRUG SCREEN, HOSP PERFORMED  VALPROIC ACID LEVEL  CSF CELL COUNT WITH DIFFERENTIAL  CSF CELL COUNT WITH DIFFERENTIAL  GLUCOSE, CSF  PROTEIN, CSF  ARBOVIRUS IGG, CSF  CBG MONITORING, ED  I-STAT CG4 LACTIC ACID, ED  I-STAT CG4 LACTIC ACID, ED    Imaging Review Dg Chest 2 View  02/13/2015  CLINICAL DATA:  History of traumatic brain injury and seizures, confusion and weakness for 1 week. Decline an activities of daily living. EXAM: CHEST  2 VIEW  COMPARISON:  Chest radiograph February 06, 2015 in chest radiograph June 25, 2014 FINDINGS: Cardiomediastinal silhouette is normal. Linear densities in lung bases are similar. The lungs are otherwise clear without pleural effusions or focal consolidations. Trachea projects midline and there is no pneumothorax. Soft tissue planes and included osseous structures are non-suspicious. Mild approximate T6 moderate and T8 chronic appearing compression fractures. IMPRESSION: Bibasilar atelectasis/ scarring. Electronically Signed   By: Awilda Metro M.D.   On: 02/13/2015 00:21   Ct Head Wo Contrast  02/12/2015  CLINICAL DATA:  Gait abnormality, confusion, history of traumatic brain injury EXAM: CT HEAD WITHOUT CONTRAST TECHNIQUE:  Contiguous axial images were obtained from the base of the skull through the vertex without intravenous contrast. COMPARISON:  06/26/2014 FINDINGS: Again noted atrophy and ventriculomegaly in left parietal and left temporal lobe. Encephalomalacia in upper aspect of left parietal lobe again noted. No intracranial hemorrhage, mass effect or midline shift. Ventricular size is stable from prior exam. No acute cortical infarction. No mass lesion is noted on this unenhanced scan. IMPRESSION: 1. No acute intracranial abnormality. Stable atrophy in left parietal and left temporal lobe. Ventriculomegaly on the left side again noted. No significant change. Electronically Signed   By: Natasha Mead M.D.   On: 02/12/2015 17:53   I have personally reviewed and evaluated these images and lab results as part of my medical decision-making.   EKG Interpretation   Date/Time:  Wednesday February 12 2015 17:18:54 EDT Ventricular Rate:  70 PR Interval:  141 QRS Duration: 88 QT Interval:  364 QTC Calculation: 393 R Axis:   89 Text Interpretation:  Sinus rhythm Low voltage, precordial leads Baseline  wander in lead(s) V3 V4 V5 V6 Significant artifact No significant change  since last tracing Confirmed  by NGUYEN, EMILY ([id number]) on 02/12/2015  6:44:52 PM      Filed Vitals:   02/12/15 2130 02/12/15 2230 02/12/15 2337 02/13/15 0030  BP: 117/66 101/69 133/76 101/69  Pulse:  62 74 62  Temp:      TempSrc:      Resp: SpO2:  93% 90% 98%     MDM   Meds given in ED:  Medications  sodium chloride 0.9 % bolus 1,000 mL (0 mLs Intravenous Stopped 02/12/15 1951)  lidocaine (PF) (XYLOCAINE) 1 % injection 5 mL (5 mLs Intradermal Given by Other 02/12/15 2145)    New Prescriptions   No medications on file    Final diagnoses:  Altered mental status, unspecified altered mental status type  Ataxia   This is a 44 y.o. male with a history of traumatic brain injury, cirrhosis, seizure disorder, alcohol abuse, and is non-verbal who presents to the ED with his father who reports the patient has had a slow decline over the past 2 or 3 weeks. They report that he has not been walking like he normally does and that his gait and balance seem off. He also reports the patient seems more confused and asked like he does not know the direction which to walk at home. They also report he has been eating less. The patient has history of seizures and a traumatic brain injury many years ago. They deny any seizure activity or loss of consciousness. They deny any recent falls. Denies any coughing recently. He is followed by neurologist Dr. Shon Millet.  On exam the patient is afebrile nontoxic appearing. He follows most of my commands however he does not respond with appropriate words. This is normal per the father. While lying the patient has good strength in his bilateral upper and lower extremities. When ambulating he appears off balance and requires my assistance to walk in the room. He is spontaneously moving his neck in all directions and shows no meningeal signs. Urinalysis is unremarkable and negative for infection. Urine drug screen is unremarkable. His ammonia level is normal. Lactic acid level is normal.  CMP indicates a creatinine of 1.57 which is an improvement from his most recent blood work 6 days ago. CBC is a mark on it for a platelet count of 85. No leukocytosis. Head CT shows no acute intracranial abnormality.  The father denies any recent changes to his medications. I called and consulted with neurologist Dr. Arbutus Leasat he would like us to check a Depakote level and a lumbar puncture. She feels that if the lumbar puncture and up to level are normal the patient can be discharged and follow-up with his normal neurologist as an outpatient. The patient's Depakote level is 98 and within normal limits. Lumbar puncture was attempted by me and by my attending Tyrone AppleEmily Nguyen multiple times. No success obtaining CSF. We feel the patient warrants LP prior to discharge. At this time of night interventional radiology is not available to do the LP. Will admit overnight for continued work up and plan to obtain LP in the AM by interventional radiology. I discussed this with hospitalist Dr. Beatrice Lecheroutovia who accepts the patient for admission. The patient's father is in agreement with plan and admission.   This patient was discussed with and evaluated by Dr. Cyndie ChimeNguyen who agrees with assessment and plan.     Everlene FarrierWilliam Mao Lockner, PA-C 02/13/15 [id number]  Leta BaptistEmily Roe Nguyen, MD 02/13/15 [id number]

## 2015-02-12 NOTE — ED Notes (Signed)
Patients father signed INFORMED CONSENT FOR MEDICAL procedure of a LUMBAR PUNCTURE. Father signed due to patient experiencing altered mental status.  Dr. Tyrone AppleEmily Nguyen signed for performing procedure. Terrilyn SaverJoAnna Azaiah Mello RN signed for witnessing signing of consent.

## 2015-02-12 NOTE — ED Notes (Addendum)
Father here with pt, pt hx of TBI and seizures, and does not communicate verbally, has incomprehensible speech. [facility] for  Confusion, weakness x1 week, decreased po intake x 4 days, and unstead gait. Father reports pt received IV fluids and discharged, since 10/13. Weakness and gait problems has not improved, father thinks ambulating ability has gotten worse. Father reports confusion has gotten worse and now pt has had tremors x4 days. Father reports [facility], family gave pt miralax, milk of magnesia and enema.s  last bowel movement Monday 10/17. Father unsure when pt last urinated today.   Before 10/13 pt was able to walk on his own, dress himself, feed himself, no longer can do those things.

## 2015-02-13 ENCOUNTER — Encounter (HOSPITAL_COMMUNITY): Payer: Self-pay | Admitting: Internal Medicine

## 2015-02-13 ENCOUNTER — Observation Stay (HOSPITAL_COMMUNITY): Payer: Medicaid Other

## 2015-02-13 DIAGNOSIS — G9340 Encephalopathy, unspecified: Secondary | ICD-10-CM | POA: Diagnosis not present

## 2015-02-13 DIAGNOSIS — R270 Ataxia, unspecified: Secondary | ICD-10-CM | POA: Diagnosis not present

## 2015-02-13 DIAGNOSIS — S069X6S Unspecified intracranial injury with loss of consciousness greater than 24 hours without return to pre-existing conscious level with patient surviving, sequela: Secondary | ICD-10-CM

## 2015-02-13 DIAGNOSIS — R4182 Altered mental status, unspecified: Secondary | ICD-10-CM | POA: Insufficient documentation

## 2015-02-13 LAB — COMPREHENSIVE METABOLIC PANEL
ALBUMIN: 3.4 g/dL — AB (ref 3.5–5.0)
ALT: 13 U/L — ABNORMAL LOW (ref 17–63)
ANION GAP: 8 (ref 5–15)
AST: 28 U/L (ref 15–41)
Alkaline Phosphatase: 63 U/L (ref 38–126)
BUN: 20 mg/dL (ref 6–20)
CO2: 26 mmol/L (ref 22–32)
Calcium: 8.9 mg/dL (ref 8.9–10.3)
Chloride: 107 mmol/L (ref 101–111)
Creatinine, Ser: 1.24 mg/dL (ref 0.61–1.24)
GFR calc Af Amer: >60 mL/min (ref >60)
GFR calc non Af Amer: >60 mL/min (ref >60)
GLUCOSE: 86 mg/dL (ref 65–99)
POTASSIUM: 4.3 mmol/L (ref 3.5–5.1)
SODIUM: 141 mmol/L (ref 135–145)
Total Bilirubin: 0.8 mg/dL (ref 0.3–1.2)
Total Protein: 6.4 g/dL — ABNORMAL LOW (ref 6.5–8.1)

## 2015-02-13 LAB — VITAMIN B12: Vitamin B-12: 554 pg/mL (ref 180–914)

## 2015-02-13 LAB — CBC
HEMATOCRIT: 38.4 % — AB (ref 39.0–52.0)
HEMOGLOBIN: 12.7 g/dL — AB (ref 13.0–17.0)
MCH: 32.5 pg (ref 26.0–34.0)
MCHC: 33.1 g/dL (ref 30.0–36.0)
MCV: 98.2 fL (ref 78.0–100.0)
Platelets: 70 10*3/uL — ABNORMAL LOW (ref 150–400)
RBC: 3.91 MIL/uL — ABNORMAL LOW (ref 4.22–5.81)
RDW: 13.6 % (ref 11.5–15.5)
WBC: 5.9 10*3/uL (ref 4.0–10.5)

## 2015-02-13 LAB — PHOSPHORUS: Phosphorus: 3.8 mg/dL (ref 2.5–4.6)

## 2015-02-13 LAB — MAGNESIUM: Magnesium: 1.9 mg/dL (ref 1.7–2.4)

## 2015-02-13 LAB — MRSA PCR SCREENING: MRSA by PCR: NEGATIVE

## 2015-02-13 LAB — TSH: TSH: 2.384 u[IU]/mL (ref 0.350–4.500)

## 2015-02-13 MED ORDER — LINACLOTIDE 145 MCG PO CAPS
145.0000 ug | ORAL_CAPSULE | Freq: Every day | ORAL | Status: DC
  Administered 2015-02-13 – 2015-02-15 (×3): 145 ug via ORAL
  Filled 2015-02-13 (×3): qty 1

## 2015-02-13 MED ORDER — SODIUM CHLORIDE 0.9 % IV SOLN
INTRAVENOUS | Status: AC
Start: 2015-02-13 — End: 2015-02-13
  Administered 2015-02-13: 02:00:00 via INTRAVENOUS

## 2015-02-13 MED ORDER — AMANTADINE HCL 100 MG PO CAPS
100.0000 mg | ORAL_CAPSULE | Freq: Two times a day (BID) | ORAL | Status: DC
  Administered 2015-02-13 – 2015-02-15 (×6): 100 mg via ORAL
  Filled 2015-02-13 (×7): qty 1

## 2015-02-13 MED ORDER — PANTOPRAZOLE SODIUM 40 MG PO TBEC
40.0000 mg | DELAYED_RELEASE_TABLET | Freq: Every day | ORAL | Status: DC
  Administered 2015-02-13 – 2015-02-15 (×3): 40 mg via ORAL
  Filled 2015-02-13 (×3): qty 1

## 2015-02-13 MED ORDER — ONDANSETRON HCL 4 MG PO TABS: 4.0000 mg | ORAL_TABLET | Freq: Four times a day (QID) | ORAL | Status: DC | PRN

## 2015-02-13 MED ORDER — ACETAMINOPHEN 650 MG RE SUPP: 650.0000 mg | Freq: Four times a day (QID) | RECTAL | Status: DC | PRN

## 2015-02-13 MED ORDER — QUETIAPINE FUMARATE 100 MG PO TABS
100.0000 mg | ORAL_TABLET | Freq: Two times a day (BID) | ORAL | Status: DC
  Administered 2015-02-13 – 2015-02-15 (×5): 100 mg via ORAL
  Filled 2015-02-13 (×6): qty 1

## 2015-02-13 MED ORDER — BROMOCRIPTINE MESYLATE 2.5 MG PO TABS
5.0000 mg | ORAL_TABLET | Freq: Two times a day (BID) | ORAL | Status: DC
  Administered 2015-02-13 – 2015-02-15 (×6): 5 mg via ORAL
  Filled 2015-02-13 (×8): qty 2

## 2015-02-13 MED ORDER — SPIRONOLACTONE 50 MG PO TABS
50.0000 mg | ORAL_TABLET | Freq: Every day | ORAL | Status: DC
  Administered 2015-02-13 – 2015-02-15 (×3): 50 mg via ORAL
  Filled 2015-02-13 (×3): qty 1

## 2015-02-13 MED ORDER — METOPROLOL TARTRATE 12.5 MG HALF TABLET
12.5000 mg | ORAL_TABLET | Freq: Two times a day (BID) | ORAL | Status: DC
  Administered 2015-02-13 – 2015-02-15 (×6): 12.5 mg via ORAL
  Filled 2015-02-13 (×7): qty 1

## 2015-02-13 MED ORDER — DIVALPROEX SODIUM ER 500 MG PO TB24
1000.0000 mg | ORAL_TABLET | Freq: Two times a day (BID) | ORAL | Status: DC
  Administered 2015-02-13 – 2015-02-15 (×6): 1000 mg via ORAL
  Filled 2015-02-13 (×7): qty 2

## 2015-02-13 MED ORDER — QUETIAPINE FUMARATE 200 MG PO TABS
200.0000 mg | ORAL_TABLET | Freq: Every day | ORAL | Status: DC
  Administered 2015-02-13 – 2015-02-14 (×3): 200 mg via ORAL
  Filled 2015-02-13 (×4): qty 1

## 2015-02-13 MED ORDER — ACETAMINOPHEN 325 MG PO TABS: 650.0000 mg | ORAL_TABLET | Freq: Four times a day (QID) | ORAL | Status: DC | PRN

## 2015-02-13 MED ORDER — LEVETIRACETAM 500 MG PO TABS
500.0000 mg | ORAL_TABLET | Freq: Two times a day (BID) | ORAL | Status: DC
  Administered 2015-02-13 – 2015-02-15 (×6): 500 mg via ORAL
  Filled 2015-02-13 (×7): qty 1

## 2015-02-13 MED ORDER — CLONAZEPAM 0.5 MG PO TABS: 0.5000 mg | ORAL_TABLET | Freq: Two times a day (BID) | ORAL | Status: DC | PRN

## 2015-02-13 MED ORDER — DOCUSATE SODIUM 100 MG PO CAPS
100.0000 mg | ORAL_CAPSULE | Freq: Two times a day (BID) | ORAL | Status: DC
  Administered 2015-02-13 – 2015-02-15 (×6): 100 mg via ORAL
  Filled 2015-02-13 (×7): qty 1

## 2015-02-13 MED ORDER — ESCITALOPRAM OXALATE 10 MG PO TABS
10.0000 mg | ORAL_TABLET | Freq: Every day | ORAL | Status: DC
  Administered 2015-02-13 – 2015-02-15 (×3): 10 mg via ORAL
  Filled 2015-02-13 (×3): qty 1

## 2015-02-13 MED ORDER — HYDROCODONE-ACETAMINOPHEN 5-325 MG PO TABS: 1.0000 | ORAL_TABLET | ORAL | Status: DC | PRN

## 2015-02-13 MED ORDER — OXYCODONE-ACETAMINOPHEN 5-325 MG PO TABS: 1.0000 | ORAL_TABLET | Freq: Four times a day (QID) | ORAL | Status: DC | PRN

## 2015-02-13 MED ORDER — QUETIAPINE FUMARATE 100 MG PO TABS: 100.0000 mg | ORAL_TABLET | Freq: Three times a day (TID) | ORAL | Status: DC

## 2015-02-13 MED ORDER — ONDANSETRON HCL 4 MG/2ML IJ SOLN: 4.0000 mg | Freq: Four times a day (QID) | INTRAMUSCULAR | Status: DC | PRN

## 2015-02-13 NOTE — Evaluation (Signed)
Physical Therapy Evaluation Patient Details Name: Hector NianRobert W Pennisi MRN: [id number] DOB: [birth year] Today's Date: 02/13/2015   History of Present Illness  Pt admitted with confusion, increased tremors,  and generalized fatigue for ~ 1wk.  Dx: dehydration vs polypharmacy and encephalopathy.  PMH includes ETOH abuse, TBI (2015), seizures, h/o DVT, H/o renal calculi, ETOH cirrhosis, depression  Clinical Impression  Pt admitted with above diagnosis. Pt currently with functional limitations due to the deficits listed below (see PT Problem List). Pt ambulated 120' with RW and min assist for balance. At present he requires hands on assist when walking as he is unsteady and a high fall risk.  Pt will benefit from skilled PT to increase their independence and safety with mobility to allow discharge to the venue listed below.       Follow Up Recommendations Home health PT; assist when walking    Equipment Recommendations  Rolling walker with 5" wheels    Recommendations for Other Services       Precautions / Restrictions Precautions Precautions: Fall Restrictions Weight Bearing Restrictions: No      Mobility  Bed Mobility Overal bed mobility: Needs Assistance Bed Mobility: Supine to Sit;Sit to Supine     Supine to sit: Supervision Sit to supine: Supervision   General bed mobility comments: for safety  Transfers Overall transfer level: Needs assistance Equipment used: None Transfers: Sit to/from Stand;Stand Pivot Transfers Sit to Stand: Min assist Stand pivot transfers: Min assist       General transfer comment: Pt unsteady with intermittent posterior lean requiring physical assist to correct, stands with narrow BOS.  Ambulation/Gait Ambulation/Gait assistance: Min assist Ambulation Distance (Feet): 120 Feet Assistive device: Rolling walker (2 wheeled) Gait Pattern/deviations: Step-through pattern;Decreased stride length;Narrow base of support Gait velocity: WFL   General  Gait Details: occaissional scissoring, min A for balance and to manage RW, pt became more steady as he walked  Information systems managertairs            Wheelchair Mobility    Modified Rankin (Stroke Patients Only)       Balance Overall balance assessment: Needs assistance Sitting-balance support: Feet supported Sitting balance-Leahy Scale: Good Sitting balance - Comments: able to don/doff socks   Standing balance support: No upper extremity supported Standing balance-Leahy Scale: Poor Standing balance comment: requires min A                              Pertinent Vitals/Pain Pain Assessment: Faces Pain Score: 0-No pain    Home Living Family/patient expects to be discharged to:: Private residence Living Arrangements: Parent Available Help at Discharge: Family             Additional Comments: Per chart review, pt was admitted form home where he lives with his parents.  Currently, there is no family present and pt unable to provide info re: home situation or PLOF (02/13/15)    Prior Function Level of Independence: Needs assistance         Comments: 02/13/15 - no family present and pt unable to provide info.  Per chart, pt was ambulatory.  Pt indicates he ambulates with RW (and was last seen at neuro rehab in what appears to be 5/16).  Per chart, pt was able to perform ADLs     Hand Dominance   Dominant Hand: Left    Extremity/Trunk Assessment   Upper Extremity Assessment: Generalized weakness;RUE deficits/detail;LUE deficits/detail RUE Deficits / Details: shoulder AROM limited  to 100*.  Tremors noted      LUE Deficits / Details: AROM WFL.  Tremors noted    Lower Extremity Assessment: Generalized weakness (able to weight bear thru BLEs, tremulous)      Cervical / Trunk Assessment: Other exceptions  Communication   Communication: Expressive difficulties;Receptive difficulties (uncertain, but pt with difficulty following commands )  Cognition Arousal/Alertness:  Awake/alert Behavior During Therapy: Flat affect Overall Cognitive Status: No family/caregiver present to determine baseline cognitive functioning                 General Comments: Pt with TBI.  He has definite expressive communication deficits, uncertain if he also has receptive deficits.  He follows one step commands consistantly when mod gestural cues provided     General Comments      Exercises        Assessment/Plan    PT Assessment Patient needs continued PT services  PT Diagnosis Abnormality of gait;Difficulty walking;Generalized weakness   PT Problem List Decreased strength;Decreased mobility;Decreased coordination;Decreased safety awareness;Decreased knowledge of use of DME  PT Treatment Interventions Gait training;DME instruction;Functional mobility training;Therapeutic activities;Therapeutic exercise;Balance training   PT Goals (Current goals can be found in the Care Plan section) Acute Rehab PT Goals Patient Stated Goal: Pt did not state PT Goal Formulation: Patient unable to participate in goal setting Time For Goal Achievement: 02/27/15 Potential to Achieve Goals: Fair    Frequency Min 3X/week   Barriers to discharge        Co-evaluation               End of Session Equipment Utilized During Treatment: Gait belt Activity Tolerance: Patient tolerated treatment well Patient left: in bed;with bed alarm set Nurse Communication: Mobility status    Functional Assessment Tool Used: clinical judgement Functional Limitation: Mobility: Walking and moving around Mobility: Walking and Moving Around Current Status [phone]): At least 20 percent but less than 40 percent impaired, limited or restricted Mobility: Walking and Moving Around Goal Status [phone]): At least 1 percent but less than 20 percent impaired, limited or restricted    Time: 1040-1058 PT Time Calculation (min) (ACUTE ONLY): 18 min   Charges:   PT Evaluation $Initial PT Evaluation Tier I:  1 Procedure     PT G Codes:   PT G-Codes **NOT FOR INPATIENT CLASS** Functional Assessment Tool Used: clinical judgement Functional Limitation: Mobility: Walking and moving around Mobility: Walking and Moving Around Current Status ([id number]): At least 20 percent but less than 40 percent impaired, limited or restricted Mobility: Walking and Moving Around Goal Status [phone]): At least 1 percent but less than 20 percent impaired, limited or restricted    Tamala Ser 02/13/2015, 11:10 AM [phone]

## 2015-02-13 NOTE — Progress Notes (Addendum)
Patient and examined. Admitted after midnight secondary to confusion, gait impairment and worsening tremors. Able to follow commands and answering simple questions. Had hx of TBI and alcohol abuse in the past. Please referred to H&P written by Dr. Felipa Furnaceutova for further info/details. Appreciate assistance and rec's from neurology service.  Plan: -will check B12 -LP attempted but not successful, UA w/o UTI  -will follow MRI results -follow rec's from neurology -PT evaluation requested  Vassie LollMadera, Sinda Leedom [phone]

## 2015-02-13 NOTE — Evaluation (Signed)
Occupational Therapy Evaluation Patient Details Name: KEADEN GUNNOE MRN: [id number] DOB: [birth year] Today's Date: 02/13/2015    History of Present Illness Pt admitted with confusion, increased tremors,  and generalized fatigue for ~ 1wk.  Dx: dehydration vs polypharmacy and encephalopathy.  PMH includes ETOH abuse, TBI (2015), seizures, h/o DVT, H/o renal calculi, ETOH cirrhosis, depression   Clinical Impression   Pt admitted with above. He demonstrates the below listed deficits and will benefit from continued OT to maximize safety and independence with BADLs.  Pt requires min A for ADLs, and functional mobility and is high risk for falls .  Bil. UE tremors noted.  No family present to provide info re: his PLOF.  He will likely require 24 hour physical assist (min A) at discharge.  IF family able to provide this, he could discharge home with [facility], otherwise recommend SNF level rehab.       Follow Up Recommendations  SNF;Supervision/Assistance - 24 hour    Equipment Recommendations  Other (comment) (TBD as pt progresses )    Recommendations for Other Services       Precautions / Restrictions Precautions Precautions: Fall Restrictions Weight Bearing Restrictions: No      Mobility Bed Mobility Overal bed mobility: Needs Assistance Bed Mobility: Supine to Sit;Sit to Supine     Supine to sit: Supervision Sit to supine: Supervision      Transfers Overall transfer level: Needs assistance Equipment used: 1 person hand held assist Transfers: Sit to/from Stand;Stand Pivot Transfers Sit to Stand: Min assist Stand pivot transfers: Min assist       General transfer comment: Pt unsteady with intermittent posterior lean requiring physical assist to correct     Balance Overall balance assessment: Needs assistance Sitting-balance support: Feet supported Sitting balance-Leahy Scale: Good Sitting balance - Comments: able to don/doff socks   Standing balance support: No  upper extremity supported Standing balance-Leahy Scale: Poor Standing balance comment: requires min A                             ADL Overall ADL's : Needs assistance/impaired Eating/Feeding: Moderate assistance;Minimal assistance Eating/Feeding Details (indicate cue type and reason): Pt able to drink from lidded cup mod I, but tremors noted.  Anticipate he experiences difficlulty manipulating utensils and has spillage while feeding, but no food tray available and pt unable to provide info re: his status  Grooming: Wash/dry hands;Wash/dry face;Oral care;Brushing hair;Minimal assistance;Standing Grooming Details (indicate cue type and reason): unsteady with standing  Upper Body Bathing: Minimal assitance;Sitting   Lower Body Bathing: Minimal assistance;Sit to/from stand   Upper Body Dressing : Minimal assistance;Sitting   Lower Body Dressing: Minimal assistance;Sit to/from stand   Toilet Transfer: Minimal assistance;Ambulation   Toileting- Clothing Manipulation and Hygiene: Minimal assistance;Sit to/from stand       Functional mobility during ADLs: Minimal assistance General ADL Comments: Pt requires assist due to imbalance and incoordination      Vision     Perception     Praxis      Pertinent Vitals/Pain Pain Assessment: Faces Pain Score: 0-No pain     Hand Dominance Left   Extremity/Trunk Assessment Upper Extremity Assessment Upper Extremity Assessment: Generalized weakness;RUE deficits/detail;LUE deficits/detail RUE Deficits / Details: shoulder AROM limited to 100*.  Tremors noted  RUE Coordination: decreased fine motor;decreased gross motor LUE Deficits / Details: AROM WFL.  Tremors noted  LUE Coordination: decreased fine motor;decreased gross motor   Lower Extremity Assessment  Lower Extremity Assessment: Defer to PT evaluation   Cervical / Trunk Assessment Cervical / Trunk Assessment: Other exceptions Cervical / Trunk Exceptions: trunk  tremulous   Communication Communication Communication: Expressive difficulties;Receptive difficulties (uncertain, but pt with difficulty following commands )   Cognition Arousal/Alertness: Awake/alert Behavior During Therapy: Flat affect;Anxious Overall Cognitive Status: No family/caregiver present to determine baseline cognitive functioning                 General Comments: Pt with TBI.  He has definite expressive communication deficits, uncertain if he also has receptive deficits.  He follows one step commands consistantly when mod gestural cues provided    General Comments       Exercises       Shoulder Instructions      Home Living Family/patient expects to be discharged to:: Private residence Living Arrangements: Parent Available Help at Discharge: Family                             Additional Comments: Per chart review, pt was admitted form home where he lives with his parents.  Currently, there is no family present and pt unable to provide info re: home situation or PLOF (02/13/15)      Prior Functioning/Environment Level of Independence: Needs assistance        Comments: 02/13/15 - no family present and pt unable to provide info.  Per chart, pt was ambulatory.  Pt indicates he ambulates with RW (and was last seen at neuro rehab in what appears to be 5/16).  Per chart, pt was able to perform ADLs    OT Diagnosis: Generalized weakness;Cognitive deficits;Disturbance of vision   OT Problem List: Decreased strength;Decreased activity tolerance;Impaired balance (sitting and/or standing);Decreased coordination;Decreased cognition;Decreased safety awareness;Decreased knowledge of use of DME or AE   OT Treatment/Interventions: Self-care/ADL training;Neuromuscular education;DME and/or AE instruction;Therapeutic activities;Cognitive remediation/compensation;Patient/family education;Balance training    OT Goals(Current goals can be found in the care plan  section) Acute Rehab OT Goals Patient Stated Goal: Pt did not state OT Goal Formulation: With patient Time For Goal Achievement: 02/27/15 Potential to Achieve Goals: Good ADL Goals Pt Will Perform Eating: with supervision;with adaptive utensils;sitting Pt Will Perform Grooming: with min guard assist;standing Pt Will Perform Upper Body Bathing: with set-up;with supervision;sitting Pt Will Perform Lower Body Bathing: with min guard assist;sit to/from stand Pt Will Perform Upper Body Dressing: with set-up;with supervision;sitting Pt Will Perform Lower Body Dressing: with min guard assist;sit to/from stand Pt Will Transfer to Toilet: with min guard assist;ambulating;regular height toilet;bedside commode;grab bars Pt Will Perform Toileting - Clothing Manipulation and hygiene: with min guard assist;sit to/from stand  OT Frequency: Min 2X/week   Barriers to D/C:    unsure if family able to provide necessary level of assist       Co-evaluation              End of Session Nurse Communication: Mobility status  Activity Tolerance: Patient tolerated treatment well Patient left: in bed;with call bell/phone within reach;with bed alarm set   Time: [id number] OT Time Calculation (min): 16 min Charges:  OT General Charges $OT Visit: 1 Procedure OT Evaluation $Initial OT Evaluation Tier I: 1 Procedure G-Codes:    Hollie Wojahn M 02/13/2015, 9:10 AM

## 2015-02-13 NOTE — ED Notes (Signed)
Made an attempt to collect urine sample ,pt unable to provide one at this time

## 2015-02-13 NOTE — Consult Note (Addendum)
Referring Physician: Dr Doutova    Chief Complaint: gait impairment, tremors, confusion, weakness.   HPI:                                                                                                                                         Hector [name] is an 44 y.o. 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  Patient is able to follow some simple commands but has marked expressive aphasia and thus can not contribute to his clinical information, family is unavailable, therefore all history was obtained from review of his medical record: " Presented with confusion and generalized fatigue for the past 1 week he have decreased PO intake for past 4 days and has been having staggering gait. He has been tremulous unable to feed self. Was seen for this on the 13th have received IV fluids and was discharged to home but unfortunately his fatigue and staggering gait did not improve his father who stated he feels that his walking has gotten worse. Patient has been more confused and now has had severe tremors. Family endorses some constipation which they have treated with Maalox milk of magnesia and enemas we will as bowel movement being 2 days ago. Father thinks he has been urinating more.  Family has denied history of any fevers chills and no coughing no recent URI type of symptoms. Patient has not been endorsing any pain. Have not been having nausea or vomiting". Further, " ptient at baseline is non communicative but is able to dress and feed himself which he is unable to do now. In emergency department was noted to have creatinine 1.57 which had been stable for past  3-4 months. Otherwise for blood cell count normal 6.1 with chills were noted to be down to 85 which is somewhat below his baseline of around 120s in July of note patient does has history of cirrhosis. Of note ammonia level was 29. divalproex level was therapeutic. UA showed no evidence of UTI". LP attempted in the ED but unsuccessfully.   Date last known well: unable to determine Time last known well: unable to determine tPA Given: no, late presentation   Past Medical History  Diagnosis Date  . TBI (traumatic brain injury) (HCC)     07/2013, fall from standing post seizure  . Seizure (HCC)   . DVT (deep venous thrombosis) (HCC)     UE, LE DVT during hospitalization  . H/O renal calculi   . Cirrhosis (HCC)   . Depression   . GERD (gastroesophageal reflux disease)   . H/O ETOH abuse     Past   Surgical History  Procedure Laterality Date  . Tracheostomy      feinstein  . Gastrostomy tube placement    . Removal of gastrostomy tube    . Brain surgery      Family History  Problem Relation Age of Onset  . Congestive Heart Failure Brother     Deceased age 26  . Hypercholesterolemia Mother   . Hypertension Mother    Social History:  reports that he has been smoking.  He has never used smokeless tobacco. He reports that he does not drink alcohol or use illicit drugs.  Allergies:  Allergies  Allergen Reactions  . Ativan [Lorazepam]     Pt has paradoxical effect with sedating medications.   . Lactulose Hives    Medications:                                                                                                                           Scheduled: . amantadine  100 mg Oral BID  . bromocriptine  5 mg Oral BID  . divalproex  1,000 mg Oral BID  . docusate sodium  100 mg Oral BID  . escitalopram  10 mg Oral Daily  . levETIRAcetam  500 mg Oral BID  . Linaclotide  145 mcg Oral Daily  . metoprolol tartrate  12.5 mg Oral BID  . pantoprazole  40 mg Oral Daily  . QUEtiapine   100 mg Oral BID  . QUEtiapine  200 mg Oral QHS  . spironolactone  50 mg Oral Daily    ROS: unable to obtain due to expressive aphasia                                                                                                                                     History obtained from chart review   Physical exam:  Constitutional: chronically ill appearing male in no apparent distress. Blood pressure 117/69, pulse 61, temperature 97.5 F (36.4 C), temperature source Oral, resp. rate 16, SpO2 97 %. Eyes: no jaundice or exophthalmos.  Head: normocephalic. Neck: supple, no bruits, no JVD. Cardiac: no murmurs. Lungs: clear. Abdomen: soft, no tender, no mass. Extremities: no edema, clubbing, or cyanosis. Amputated V finger right hand Skin: no rash  Neurologic Examination:  General: NAD Mental Status: Alert and awake, follows some simple commands, marked expressive aphasia. oriented, thought content appropriate.  Speech fluent without evidence of aphasia. Cranial Nerves: II: Discs flat bilaterally; Visual fields grossly normal, pupils equal, round, reactive to light and accommodation III,IV, VI: ptosis not present, extra-ocular motions intact bilaterally V,VII: smile asymmetric due to right face weakness, facial light touch sensation normal bilaterally VIII: hearing normal bilaterally IX,X: uvula rises symmetrically XI: bilateral shoulder shrug XII: midline tongue extension without atrophy or fasciculations Motor: Moves all extremities spontaneously symmetrically, perhaps some clumsiness right UE Tone and bulk:normal tone throughout; no atrophy noted Sensory: Pinprick and light touch intact throughout, bilaterally Deep Tendon Reflexes:  Hypoactive all over  Plantars: Right: downgoing   Left: downgoing Cerebellar: Bilateral intention tremor, ? mildly impaired heel-to-shin on the  right Gait:  Can not walk without assistance, no frank ataxia but has a tendency to circumduct the right leg. CV: pulses palpable throughout      Results for orders placed or performed during the hospital encounter of 02/12/15 (from the past 48 hour(s))  CBG monitoring, ED     Status: None   Collection Time: 02/12/15  4:55 PM  Result Value Ref Range   Glucose-Capillary 91 65 - 99 mg/dL  Comprehensive metabolic panel     Status: Abnormal   Collection Time: 02/12/15  5:16 PM  Result Value Ref Range   Sodium 142 135 - 145 mmol/L   Potassium 4.6 3.5 - 5.1 mmol/L   Chloride 102 101 - 111 mmol/L   CO2 30 22 - 32 mmol/L   Glucose, Bld 110 (H) 65 - 99 mg/dL   BUN 24 (H) 6 - 20 mg/dL   Creatinine, Ser 1.57 (H) 0.61 - 1.24 mg/dL   Calcium 10.1 8.9 - 10.3 mg/dL   Total Protein 8.3 (H) 6.5 - 8.1 g/dL   Albumin 4.6 3.5 - 5.0 g/dL   AST 38 15 - 41 U/L   ALT 18 17 - 63 U/L   Alkaline Phosphatase 81 38 - 126 U/L   Total Bilirubin 1.0 0.3 - 1.2 mg/dL   GFR calc non Af Amer 51 (L) >60 mL/min   GFR calc Af Amer 59 (L) >60 mL/min    Comment: (NOTE) The eGFR has been calculated using the CKD EPI equation. This calculation has not been validated in all clinical situations. eGFR's persistently <60 mL/min signify possible Chronic Kidney Disease.    Anion gap 10 5 - 15  CBC with Differential     Status: Abnormal   Collection Time: 02/12/15  5:16 PM  Result Value Ref Range   WBC 6.1 4.0 - 10.5 K/uL   RBC 4.46 4.22 - 5.81 MIL/uL   Hemoglobin 14.5 13.0 - 17.0 g/dL   HCT 43.6 39.0 - 52.0 %   MCV 97.8 78.0 - 100.0 fL   MCH 32.5 26.0 - 34.0 pg   MCHC 33.3 30.0 - 36.0 g/dL   RDW 13.7 11.5 - 15.5 %   Platelets 85 (L) 150 - 400 K/uL    Comment: REPEATED TO VERIFY SPECIMEN CHECKED FOR CLOTS PLATELET COUNT CONFIRMED BY SMEAR    Neutrophils Relative % 48 %   Neutro Abs 2.9 1.7 - 7.7 K/uL   Lymphocytes Relative 39 %   Lymphs Abs 2.4 0.7 - 4.0 K/uL   Monocytes Relative 10 %   Monocytes Absolute  0.6 0.1 - 1.0 K/uL   Eosinophils Relative 3 %   Eosinophils Absolute 0.2 0.0 - 0.7 K/uL     Basophils Relative 0 %   Basophils Absolute 0.0 0.0 - 0.1 K/uL  I-Stat CG4 Lactic Acid, ED (Not at [facility])     Status: None   Collection Time: 02/12/15  5:25 PM  Result Value Ref Range   Lactic Acid, Venous 1.12 0.5 - 2.0 mmol/L  Ammonia     Status: None   Collection Time: 02/12/15  6:01 PM  Result Value Ref Range   Ammonia 29 9 - 35 umol/L  Urinalysis, Routine w reflex microscopic (not at [facility])     Status: None   Collection Time: 02/12/15  7:12 PM  Result Value Ref Range   Color, Urine YELLOW YELLOW   APPearance CLEAR CLEAR   Specific Gravity, Urine 1.012 1.005 - 1.030   pH 6.5 5.0 - 8.0   Glucose, UA NEGATIVE NEGATIVE mg/dL   Hgb urine dipstick NEGATIVE NEGATIVE   Bilirubin Urine NEGATIVE NEGATIVE   Ketones, ur NEGATIVE NEGATIVE mg/dL   Protein, ur NEGATIVE NEGATIVE mg/dL   Urobilinogen, UA 1.0 0.0 - 1.0 mg/dL   Nitrite NEGATIVE NEGATIVE   Leukocytes, UA NEGATIVE NEGATIVE    Comment: MICROSCOPIC NOT DONE ON URINES WITH NEGATIVE PROTEIN, BLOOD, LEUKOCYTES, NITRITE, OR GLUCOSE <1000 mg/dL.  Urine rapid drug screen (hosp performed)     Status: None   Collection Time: 02/12/15  7:12 PM  Result Value Ref Range   Opiates NONE DETECTED NONE DETECTED   Cocaine NONE DETECTED NONE DETECTED   Benzodiazepines NONE DETECTED NONE DETECTED   Amphetamines NONE DETECTED NONE DETECTED   Tetrahydrocannabinol NONE DETECTED NONE DETECTED   Barbiturates NONE DETECTED NONE DETECTED    Comment:        DRUG SCREEN FOR MEDICAL PURPOSES ONLY.  IF CONFIRMATION IS NEEDED FOR ANY PURPOSE, NOTIFY LAB WITHIN 5 DAYS.        LOWEST DETECTABLE LIMITS FOR URINE DRUG SCREEN Drug Class       Cutoff (ng/mL) Amphetamine      1000 Barbiturate      200 Benzodiazepine   200 Tricyclics       300 Opiates          300 Cocaine          300 THC              50   I-Stat CG4 Lactic Acid, ED (Not at [facility])     Status: None    Collection Time: 02/12/15  7:57 PM  Result Value Ref Range   Lactic Acid, Venous 0.86 0.5 - 2.0 mmol/L  Valproic acid level     Status: None   Collection Time: 02/12/15  8:54 PM  Result Value Ref Range   Valproic Acid Lvl 98 50.0 - 100.0 ug/mL  MRSA PCR Screening     Status: None   Collection Time: 02/13/15  2:28 AM  Result Value Ref Range   MRSA by PCR NEGATIVE NEGATIVE    Comment:        The GeneXpert MRSA Assay (FDA approved for NASAL specimens only), is one component of a comprehensive MRSA colonization surveillance program. It is not intended to diagnose MRSA infection nor to guide or monitor treatment for MRSA infections.   Magnesium     Status: None   Collection Time: 02/13/15  5:40 AM  Result Value Ref Range   Magnesium 1.9 1.7 - 2.4 mg/dL  Phosphorus     Status: None   Collection Time: 02/13/15  5:40 AM  Result Value Ref Range   Phosphorus 3.8 2.5 -   4.6 mg/dL  TSH     Status: None   Collection Time: 02/13/15  5:40 AM  Result Value Ref Range   TSH 2.384 0.350 - 4.500 uIU/mL  Comprehensive metabolic panel     Status: Abnormal   Collection Time: 02/13/15  5:40 AM  Result Value Ref Range   Sodium 141 135 - 145 mmol/L   Potassium 4.3 3.5 - 5.1 mmol/L   Chloride 107 101 - 111 mmol/L   CO2 26 22 - 32 mmol/L   Glucose, Bld 86 65 - 99 mg/dL   BUN 20 6 - 20 mg/dL   Creatinine, Ser 1.24 0.61 - 1.24 mg/dL   Calcium 8.9 8.9 - 10.3 mg/dL   Total Protein 6.4 (L) 6.5 - 8.1 g/dL   Albumin 3.4 (L) 3.5 - 5.0 g/dL   AST 28 15 - 41 U/L   ALT 13 (L) 17 - 63 U/L   Alkaline Phosphatase 63 38 - 126 U/L   Total Bilirubin 0.8 0.3 - 1.2 mg/dL   GFR calc non Af Amer >60 >60 mL/min   GFR calc Af Amer >60 >60 mL/min    Comment: (NOTE) The eGFR has been calculated using the CKD EPI equation. This calculation has not been validated in all clinical situations. eGFR's persistently <60 mL/min signify possible Chronic Kidney Disease.    Anion gap 8 5 - 15  CBC     Status: Abnormal    Collection Time: 02/13/15  5:40 AM  Result Value Ref Range   WBC 5.9 4.0 - 10.5 K/uL   RBC 3.91 (L) 4.22 - 5.81 MIL/uL   Hemoglobin 12.7 (L) 13.0 - 17.0 g/dL   HCT 38.4 (L) 39.0 - 52.0 %   MCV 98.2 78.0 - 100.0 fL   MCH 32.5 26.0 - 34.0 pg   MCHC 33.1 30.0 - 36.0 g/dL   RDW 13.6 11.5 - 15.5 %   Platelets 70 (L) 150 - 400 K/uL    Comment: CONSISTENT WITH PREVIOUS RESULT   Dg Chest 2 View  02/13/2015  CLINICAL DATA:  History of traumatic brain injury and seizures, confusion and weakness for 1 week. Decline an activities of daily living. EXAM: CHEST  2 VIEW COMPARISON:  Chest radiograph February 06, 2015 in chest radiograph June 25, 2014 FINDINGS: Cardiomediastinal silhouette is normal. Linear densities in lung bases are similar. The lungs are otherwise clear without pleural effusions or focal consolidations. Trachea projects midline and there is no pneumothorax. Soft tissue planes and included osseous structures are non-suspicious. Mild approximate T6 moderate and T8 chronic appearing compression fractures. IMPRESSION: Bibasilar atelectasis/ scarring. Electronically Signed   By: Elon Alas M.D.   On: 02/13/2015 00:21   Ct Head Wo Contrast  02/12/2015  CLINICAL DATA:  Gait abnormality, confusion, history of traumatic brain injury EXAM: CT HEAD WITHOUT CONTRAST TECHNIQUE: Contiguous axial images were obtained from the base of the skull through the vertex without intravenous contrast. COMPARISON:  06/26/2014 FINDINGS: Again noted atrophy and ventriculomegaly in left parietal and left temporal lobe. Encephalomalacia in upper aspect of left parietal lobe again noted. No intracranial hemorrhage, mass effect or midline shift. Ventricular size is stable from prior exam. No acute cortical infarction. No mass lesion is noted on this unenhanced scan. IMPRESSION: 1. No acute intracranial abnormality. Stable atrophy in left parietal and left temporal lobe. Ventriculomegaly on the left side again noted.  No significant change. Electronically Signed   By: Lahoma Crocker M.D.   On: 02/12/2015 17:53  Assessment: 44 y.o. 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 Patient has prominent motor aphasia (old), is able to follow some simple commands, has bilateral intention tremor and perhaps impaired right HKS, and can not walk without assistance, no frank ataxia but has a tendency to circumduct the right leg (reportedly this is new). CT head without acute abnormality. VPA level therapeutic. LP attempted in the ED but unsuccessfully, however I don't think he needs a LP at his moment.   The neuro-exam seems to pinpoint more to cerebellar dysfunction, may be product of his reported alcohol abuse. Although VPA therapeutic, polypharmacy could be playing a role in his current presentation.   Ordered MRI brain without contrast to further investigate his syndrome.  PT. Will follow up.  Dorian Pod, MD [facility] [phone]  02/13/2015, 9:21 AM

## 2015-02-13 NOTE — ED Notes (Signed)
Emergency Contact Theone StanleyLester Wayne (father) [phone] Password: "RN"

## 2015-02-13 NOTE — H&P (Signed)
PCP: Ulyess Blossom, MD [facility]  Referring provider danise   Chief Complaint: Confusion and staggering walking   HPI: Hector [name] is a 44 y.o. male   has a past medical history of TBI (traumatic brain injury) (HCC); Seizure (HCC); DVT (deep venous thrombosis) (HCC); H/O renal calculi; Cirrhosis (HCC); Depression; GERD (gastroesophageal reflux disease); and H/O ETOH abuse.   Presented with confusion and generalized fatigue for the past 1 week he have decreased PO intake for past 4 days and has been having staggering gait. He has been tremulous unable to feed self. Was seen for this on the 13th have received IV fluids and was discharged to home but unfortunately his fatigue and staggering gait did not improve his father who stated he feels that his walking has gotten worse. Patient has been more confused and now has had severe tremors. Family endorses some constipation which they have treated with Maalox milk of magnesia and enemas we will as bowel movement being 2 days ago. Father thinks he has been urinating more.  Family has denied history of any fevers chills and no coughing no recent URI type of symptoms. Patient has not been endorsing any pain. Have not been having nausea or vomiting.  Patient with  past history of alcohol abuse resulting in cirrhosis.  Developed traumatic brain injury in April 2015 due to alcohol related seizure resulting in a fall resulting in subdural hematoma and subsequent symptomatic epilepsy. Patient have had in the past admissions for confusion secondary to dehydration due to dysphagia. As well as history of pneumonias resulting in sepsis. Patient at baseline is noncommunicative. But he is able to dress and feed himself which he is unable to do now.    In emergency department was noted to have creatinine 1.57 which had been stable for past 3-4 months Otherwise for blood cell count normal 6.1 with chills were noted to be  down to 85 which is somewhat below his baseline of around 120s in July of note patient does has history of cirrhosis. Of note ammonia level was 29. divalproex level was therapeutic.  UA showed no evidence of UTI ER provider has discussed case with neurology who recommended LP. ER provider has attempted LP but was unsuccessful 2 hospitalist was called for admission so that interventional radiology can perform LP in the morning.    His psychiatric  medications have required adjustments as patient has responded in the past with confusion and all combativeness to trial of different medications.   Hospitalist was called for admission fworsening confusion and unsteady gait  Review of Systems:    Pertinent positives include:  Confusion and unsteady gait  Constitutional:  No weight loss, night sweats, Fevers, chills, fatigue, weight loss  HEENT:  No headaches, Difficulty swallowing,Tooth/dental problems,Sore throat,  No sneezing, itching, ear ache, nasal congestion, post nasal drip,  Cardio-vascular:  No chest pain, Orthopnea, PND, anasarca, dizziness, palpitations.no Bilateral lower extremity swelling  GI:  No heartburn, indigestion, abdominal pain, nausea, vomiting, diarrhea, change in bowel habits, loss of appetite, melena, blood in stool, hematemesis Resp:  no shortness of breath at rest. No dyspnea on exertion, No excess mucus, no productive cough, No non-productive cough, No coughing up of blood.No change in color of mucus.No wheezing. Skin:  no rash or lesions. No jaundice GU:  no dysuria, change in color of urine, no urgency or frequency. No straining to urinate.  No flank pain.  Musculoskeletal:  No joint pain or  no joint swelling. No decreased range of motion. No back pain.  Psych:  No change in mood or affect. No depression or anxiety. No memory loss.  Neuro: no localizing neurological complaints, no tingling, no weakness, no double vision, no slurred speech,   Otherwise ROS  are negative except for above, 10 systems were reviewed  Past Medical History: Past Medical History  Diagnosis Date  . TBI (traumatic brain injury) (HCC)     07/2013, fall from standing post seizure  . Seizure (HCC)   . DVT (deep venous thrombosis) (HCC)     UE, LE DVT during hospitalization  . H/O renal calculi   . Cirrhosis (HCC)   . Depression   . GERD (gastroesophageal reflux disease)   . H/O ETOH abuse    Past Surgical History  Procedure Laterality Date  . Tracheostomy      feinstein  . Gastrostomy tube placement    . Removal of gastrostomy tube    . Brain surgery       Medications: Prior to Admission medications   Medication Sig Start Date End Date Taking? Authorizing Provider  amantadine (SYMMETREL) 100 MG capsule Take 100 mg by mouth 2 (two) times daily.   Yes Historical Provider, MD  bromocriptine (PARLODEL) 2.5 MG tablet Take 5 mg by mouth 2 (two) times daily.   Yes Historical Provider, MD  clonazePAM (KLONOPIN) 0.5 MG tablet Take 0.5 mg by mouth 2 (two) times daily as needed for anxiety (anxiety).    Yes Historical Provider, MD  divalproex (DEPAKOTE ER) 500 MG 24 hr tablet Take 1 tablet (500 mg total) by mouth 2 (two) times daily. Patient taking differently: Take 1,000 mg by mouth 2 (two) times daily.  03/20/14  Yes Drema DallasAdam R Jaffe, DO  docusate sodium (COLACE) 100 MG capsule Take 100 mg by mouth 2 (two) times daily.   Yes Historical Provider, MD  escitalopram (LEXAPRO) 10 MG tablet Take 10 mg by mouth daily.   Yes Historical Provider, MD  furosemide (LASIX) 20 MG tablet Take 20 mg by mouth daily.   Yes Historical Provider, MD  levETIRAcetam (KEPPRA) 250 MG tablet Take 500 mg by mouth 2 (two) times daily.   Yes Historical Provider, MD  Linaclotide Karlene Einstein(LINZESS) 145 MCG CAPS capsule Take 145 mcg by mouth daily.   Yes Historical Provider, MD  metoprolol tartrate (LOPRESSOR) 25 MG tablet Take 12.5 mg by mouth 2 (two) times daily.   Yes Historical Provider, MD  ondansetron  (ZOFRAN) 4 MG tablet Take 1 tablet (4 mg total) by mouth every 6 (six) hours. 12/10/13  Yes Junious SilkHannah Merrell, PA-C  oxyCODONE-acetaminophen (PERCOCET/ROXICET) 5-325 MG per tablet Take 1-2 tablets by mouth every 6 (six) hours as needed for moderate pain or severe pain. 01/22/14  Yes Mahima Pandey, MD  pantoprazole (PROTONIX) 40 MG tablet Take 40 mg by mouth daily.   Yes Historical Provider, MD  polyethylene glycol (MIRALAX / GLYCOLAX) packet Take 17 g by mouth daily as needed for mild constipation.   Yes Historical Provider, MD  QUEtiapine (SEROQUEL) 100 MG tablet Take 1 tablet (100 mg total) by mouth 3 (three) times daily. Patient taking differently: Take 100-200 mg by mouth 3 (three) times daily. 1 tab in the morning and afternoon, 2 tabs at bedtime 01/03/14  Yes Renae FickleMackenzie Short, MD  spironolactone (ALDACTONE) 50 MG tablet Take 50 mg by mouth daily.   Yes Historical Provider, MD  chlorhexidine (PERIDEX) 0.12 % solution 15 mLs by Mouth Rinse route 2 (two) times daily.  Patient not taking: Reported on 02/12/2015 06/27/14   Alison Murray, MD  Maltodextrin-Xanthan Gum (RESOURCE THICKENUP CLEAR) POWD Take 120 g by mouth as needed. Patient not taking: Reported on 11/22/2014 06/27/14   Alison Murray, MD    Allergies:   Allergies  Allergen Reactions  . Ativan [Lorazepam]     Pt has paradoxical effect with sedating medications.   . Lactulose Hives    Social History:  Ambulatory  independently  Lives at home With family   reports that he has been smoking.  He has never used smokeless tobacco. He reports that he does not drink alcohol or use illicit drugs.    Family History: family history includes Congestive Heart Failure in his brother; Hypercholesterolemia in his mother; Hypertension in his mother.    Physical Exam: Patient Vitals for the past 24 hrs:  BP Temp Temp src Pulse Resp SpO2  02/12/15 2337 133/76 mmHg - - 74 (!) 28 90 %  02/12/15 2130 117/66 mmHg - - - 21 -  02/12/15 2123 106/69 mmHg -  - 68 16 99 %  02/12/15 2120 111/62 mmHg - - 85 19 95 %  02/12/15 2030 98/65 mmHg - - - 17 -  02/12/15 2000 108/60 mmHg - - 65 19 100 %  02/12/15 1930 107/65 mmHg - - - 22 -  02/12/15 1832 97/63 mmHg - - 66 19 99 %  02/12/15 1657 97/62 mmHg 98.4 F (36.9 C) Oral 78 17 94 %    1. General:  in No Acute distress 2. Psychological: Alert but not  Oriented 3. Head/ENT:    Dry Mucous Membranes                          Head Non traumatic, neck supple                          Normal  Dentition 4. SKIN:  decreased Skin turgor,  Skin clean Dry and intact no rash 5. Heart: Regular rate and rhythm no Murmur, Rub or gallop 6. Lungs: Clear to auscultation bilaterally, no wheezes or crackles   7. Abdomen: Soft, non-tender, Non distended 8. Lower extremities: no clubbing, cyanosis, or edema 9. Neurologically residual right side weakness, chronic 10. MSK: Normal range of motion  body mass index is unknown because there is no weight on file.   Labs on Admission:   Results for orders placed or performed during the hospital encounter of 02/12/15 (from the past 24 hour(s))  CBG monitoring, ED     Status: None   Collection Time: 02/12/15  4:55 PM  Result Value Ref Range   Glucose-Capillary 91 65 - 99 mg/dL  Comprehensive metabolic panel     Status: Abnormal   Collection Time: 02/12/15  5:16 PM  Result Value Ref Range   Sodium 142 135 - 145 mmol/L   Potassium 4.6 3.5 - 5.1 mmol/L   Chloride 102 101 - 111 mmol/L   CO2 30 22 - 32 mmol/L   Glucose, Bld 110 (H) 65 - 99 mg/dL   BUN 24 (H) 6 - 20 mg/dL   Creatinine, Ser 8.11 (H) 0.61 - 1.24 mg/dL   Calcium 91.4 8.9 - 78.2 mg/dL   Total Protein 8.3 (H) 6.5 - 8.1 g/dL   Albumin 4.6 3.5 - 5.0 g/dL   AST 38 15 - 41 U/L   ALT 18 17 - 63 U/L   Alkaline Phosphatase  81 38 - 126 U/L   Total Bilirubin 1.0 0.3 - 1.2 mg/dL   GFR calc non Af Amer 51 (L) >60 mL/min   GFR calc Af Amer 59 (L) >60 mL/min   Anion gap 10 5 - 15  CBC with Differential     Status:  Abnormal   Collection Time: 02/12/15  5:16 PM  Result Value Ref Range   WBC 6.1 4.0 - 10.5 K/uL   RBC 4.46 4.22 - 5.81 MIL/uL   Hemoglobin 14.5 13.0 - 17.0 g/dL   HCT 40.9 81.1 - 91.4 %   MCV 97.8 78.0 - 100.0 fL   MCH 32.5 26.0 - 34.0 pg   MCHC 33.3 30.0 - 36.0 g/dL   RDW 78.2 95.6 - 21.3 %   Platelets 85 (L) 150 - 400 K/uL   Neutrophils Relative % 48 %   Neutro Abs 2.9 1.7 - 7.7 K/uL   Lymphocytes Relative 39 %   Lymphs Abs 2.4 0.7 - 4.0 K/uL   Monocytes Relative 10 %   Monocytes Absolute 0.6 0.1 - 1.0 K/uL   Eosinophils Relative 3 %   Eosinophils Absolute 0.2 0.0 - 0.7 K/uL   Basophils Relative 0 %   Basophils Absolute 0.0 0.0 - 0.1 K/uL  I-Stat CG4 Lactic Acid, ED (Not at [facility])     Status: None   Collection Time: 02/12/15  5:25 PM  Result Value Ref Range   Lactic Acid, Venous 1.12 0.5 - 2.0 mmol/L  Ammonia     Status: None   Collection Time: 02/12/15  6:01 PM  Result Value Ref Range   Ammonia 29 9 - 35 umol/L  Urinalysis, Routine w reflex microscopic (not at [facility])     Status: None   Collection Time: 02/12/15  7:12 PM  Result Value Ref Range   Color, Urine YELLOW YELLOW   APPearance CLEAR CLEAR   Specific Gravity, Urine 1.012 1.005 - 1.030   pH 6.5 5.0 - 8.0   Glucose, UA NEGATIVE NEGATIVE mg/dL   Hgb urine dipstick NEGATIVE NEGATIVE   Bilirubin Urine NEGATIVE NEGATIVE   Ketones, ur NEGATIVE NEGATIVE mg/dL   Protein, ur NEGATIVE NEGATIVE mg/dL   Urobilinogen, UA 1.0 0.0 - 1.0 mg/dL   Nitrite NEGATIVE NEGATIVE   Leukocytes, UA NEGATIVE NEGATIVE  Urine rapid drug screen (hosp performed)     Status: None   Collection Time: 02/12/15  7:12 PM  Result Value Ref Range   Opiates NONE DETECTED NONE DETECTED   Cocaine NONE DETECTED NONE DETECTED   Benzodiazepines NONE DETECTED NONE DETECTED   Amphetamines NONE DETECTED NONE DETECTED   Tetrahydrocannabinol NONE DETECTED NONE DETECTED   Barbiturates NONE DETECTED NONE DETECTED  I-Stat CG4 Lactic Acid, ED (Not at [facility])      Status: None   Collection Time: 02/12/15  7:57 PM  Result Value Ref Range   Lactic Acid, Venous 0.86 0.5 - 2.0 mmol/L  Valproic acid level     Status: None   Collection Time: 02/12/15  8:54 PM  Result Value Ref Range   Valproic Acid Lvl 98 50.0 - 100.0 ug/mL    UA  no evidence of UTI  Lab Results  Component Value Date   HGBA1C 6.2* 12/24/2013    Estimated Creatinine Clearance: 50.6 mL/min (by C-G formula based on Cr of 1.57).  BNP (last 3 results) No results for input(s): PROBNP in the last 8760 hours.  Other results:  I have pearsonaly reviewed this: ECG REPORT  Rate 71  Rhythm:  Normal sinus rhythm ST&T Change:  No ischemic change QTC 425   There were no vitals filed for this visit.   Cultures:    Component Value Date/Time   SDES SPUTUM 12/20/2013 1500   SPECREQUEST NONE 12/20/2013 1500   CULT  12/20/2013 1500    NORMAL OROPHARYNGEAL FLORA Performed at [facility]   REPTSTATUS 12/22/2013 FINAL 12/20/2013 1500     Radiological Exams on Admission: Ct Head Wo Contrast  02/12/2015  CLINICAL DATA:  Gait abnormality, confusion, history of traumatic brain injury EXAM: CT HEAD WITHOUT CONTRAST TECHNIQUE: Contiguous axial images were obtained from the base of the skull through the vertex without intravenous contrast. COMPARISON:  06/26/2014 FINDINGS: Again noted atrophy and ventriculomegaly in left parietal and left temporal lobe. Encephalomalacia in upper aspect of left parietal lobe again noted. No intracranial hemorrhage, mass effect or midline shift. Ventricular size is stable from prior exam. No acute cortical infarction. No mass lesion is noted on this unenhanced scan. IMPRESSION: 1. No acute intracranial abnormality. Stable atrophy in left parietal and left temporal lobe. Ventriculomegaly on the left side again noted. No significant change. Electronically Signed   By: Natasha Mead M.D.   On: 02/12/2015 17:53    Chart has been reviewed  Family not at   Bedside  plan of care was discussed with father [phone] over the PHONE  Assessment/Plan  44 year old gentleman with a history of alcohol abuse in the past resulting in cirrhosis and history of traumatic brain injury since 2015 resulting in subdural hematoma and epilepsy encephalopathy. Presents with worsening confusion and tremor.   Present on Admission:  . TBI (traumatic brain injury) [facility]) this is chronic resulting in epilepsy. Patient is on multiple medications. With worsening tremors and gait instability concerning for polypharmacy versus effects of dehydration. This symptoms has been progressive the past week or more. We'll have PT OT evaluation for safe discharge  . Encephalopathy acute will need neurology consult to assess medications for possible changes. Will order EEG. Hold off on father LPs at this point as per discussion with neurology Dr. Petra Kuba is aware Dehydration - patient decreased by mouth intake will administer IV fluids History of dysphagia. For now continue dysphagia diet and reevaluate once encephalopathy has resolved Prophylaxis: SCD   CODE STATUS:  FULL CODE  as per family  Disposition:  To home once workup is complete and patient is stable  Other plan as per orders.  I have spent a total of 65 min on this admission discussed case with neurology   Amairany Schumpert 02/13/2015, 12:02 AM  [facility]  Pager [phone]   after 2 AM please page floor coverage PA If 7AM-7PM, please contact the day team taking care of the patient  [email or web address]  Password [email or web address]

## 2015-02-14 ENCOUNTER — Telehealth: Payer: Self-pay | Admitting: Neurology

## 2015-02-14 DIAGNOSIS — S069X0S Unspecified intracranial injury without loss of consciousness, sequela: Secondary | ICD-10-CM

## 2015-02-14 DIAGNOSIS — G9340 Encephalopathy, unspecified: Secondary | ICD-10-CM | POA: Diagnosis not present

## 2015-02-14 DIAGNOSIS — R270 Ataxia, unspecified: Secondary | ICD-10-CM | POA: Diagnosis not present

## 2015-02-14 DIAGNOSIS — S069X0D Unspecified intracranial injury without loss of consciousness, subsequent encounter: Secondary | ICD-10-CM | POA: Diagnosis not present

## 2015-02-14 DIAGNOSIS — K7030 Alcoholic cirrhosis of liver without ascites: Secondary | ICD-10-CM

## 2015-02-14 DIAGNOSIS — R561 Post traumatic seizures: Secondary | ICD-10-CM

## 2015-02-14 DIAGNOSIS — R404 Transient alteration of awareness: Secondary | ICD-10-CM | POA: Diagnosis not present

## 2015-02-14 LAB — BASIC METABOLIC PANEL
Anion gap: 8 (ref 5–15)
BUN: 17 mg/dL (ref 6–20)
CHLORIDE: 104 mmol/L (ref 101–111)
CO2: 30 mmol/L (ref 22–32)
CREATININE: 1.13 mg/dL (ref 0.61–1.24)
Calcium: 9.5 mg/dL (ref 8.9–10.3)
GFR calc Af Amer: >60 mL/min (ref >60)
GFR calc non Af Amer: >60 mL/min (ref >60)
GLUCOSE: 85 mg/dL (ref 65–99)
POTASSIUM: 4.4 mmol/L (ref 3.5–5.1)
SODIUM: 142 mmol/L (ref 135–145)

## 2015-02-14 LAB — CBC
HEMATOCRIT: 42.2 % (ref 39.0–52.0)
Hemoglobin: 13.9 g/dL (ref 13.0–17.0)
MCH: 32.3 pg (ref 26.0–34.0)
MCHC: 32.9 g/dL (ref 30.0–36.0)
MCV: 97.9 fL (ref 78.0–100.0)
PLATELETS: 74 10*3/uL — AB (ref 150–400)
RBC: 4.31 MIL/uL (ref 4.22–5.81)
RDW: 13.4 % (ref 11.5–15.5)
WBC: 5.2 10*3/uL (ref 4.0–10.5)

## 2015-02-14 NOTE — Progress Notes (Signed)
[facility] PROGRESS NOTE  Hector [name] ZOX:[id number] DOB: 09/17/1970 DOA: 02/12/2015 PCP: Ulyess Blossom, MD  Assessment/Plan: 1-confusion/AMS: etiology unclear but suspected that might be secondary to polypharmacy -MRI w/o acute intracranial abnormalities -LP unsuccessful, and patient is afebrile, no WBC's elevation or with signs of infection currently -neurology is on board and will follow rec's -will continue IVF's  -follow PT/OT rec's  2-TBI/prior alcohol abuse and epilepsy: findings of chronic encephalomalacia on images studies -could be progression of brain damage from prior alcohol abuse -EEG no suggesting acute seizure activity -will continue current meds  3-dehydration: will continue IVF's  4-acute kidney injury: due to pre-renal azotemia and continue use of diuretics -continue holding furosemide -continue IVF's -renal function improving and Cr trending down  5-dysphagia: will follow dysphagia 3 diet and thin liquids  6-hx of cirrhosis: no icterus and no ascites on exam -will continue aldactone -once volume repleted will resume lasix as well -due to prior hx of alcohol abuse  7-depression anxiety: continue home psych regimen   Code Status: Full Family Communication: no family at bedside  Disposition Plan: to be determine    Consultants:  Neurology   Procedures:  See below for x-ray reports  Antibiotics:  None   HPI/Subjective: Feeling better, no fever and overall more oriented. Denies CP and is afebrile.  Objective: Filed Vitals:   02/14/15 1400  BP: 102/72  Pulse: 77  Temp: 97.4 F (36.3 C)  Resp: 20    Intake/Output Summary (Last 24 hours) at 02/14/15 1820 Last data filed at 02/14/15 0900  Gross per 24 hour  Intake    120 ml  Output      0 ml  Net    120 ml   There were no vitals filed for this visit.  Exam:   General:  Afebrile, able to follow commands, no acute distress. Positive essential and intentional  tremors appreciated  Cardiovascular: S1 and S2, no rubs or gallops  Respiratory: CTA bilaterally  Abdomen: soft, NT, ND, positive BS  Musculoskeletal: no edema, no cyanosis   Data Reviewed: Basic Metabolic Panel:  Recent Labs Lab 02/12/15 1716 02/13/15 0540 02/14/15 0520  NA 142 141 142  K 4.6 4.3 4.4  CL 102 107 104  CO2 GLUCOSE 110* 86 85  BUN 24* 20 17  CREATININE 1.57* 1.24 1.13  CALCIUM 10.1 8.9 9.5  MG  --  1.9  --   PHOS  --  3.8  --    Liver Function Tests:  Recent Labs Lab 02/12/15 1716 02/13/15 0540  AST 38 28  ALT 18 13*  ALKPHOS 81 63  BILITOT 1.0 0.8  PROT 8.3* 6.4*  ALBUMIN 4.6 3.4*    Recent Labs Lab 02/12/15 1801  AMMONIA 29   CBC:  Recent Labs Lab 02/12/15 1716 02/13/15 0540 02/14/15 0520  WBC 6.1 5.9 5.2  NEUTROABS 2.9  --   --   HGB 14.5 12.7* 13.9  HCT 43.6 38.4* 42.2  MCV 97.8 98.2 97.9  PLT 85* 70* 74*   ProBNP (last 3 results) No results for input(s): PROBNP in the last 8760 hours.  CBG:  Recent Labs Lab 02/12/15 1655  GLUCAP 91    Recent Results (from the past 240 hour(s))  MRSA PCR Screening     Status: None   Collection Time: 02/13/15  2:28 AM  Result Value Ref Range Status   MRSA by PCR NEGATIVE NEGATIVE Final    Comment:  The GeneXpert MRSA Assay (FDA approved for NASAL specimens only), is one component of a comprehensive MRSA colonization surveillance program. It is not intended to diagnose MRSA infection nor to guide or monitor treatment for MRSA infections.      Studies: Dg Chest 2 View  02/13/2015  CLINICAL DATA:  History of traumatic brain injury and seizures, confusion and weakness for 1 week. Decline an activities of daily living. EXAM: CHEST  2 VIEW COMPARISON:  Chest radiograph February 06, 2015 in chest radiograph June 25, 2014 FINDINGS: Cardiomediastinal silhouette is normal. Linear densities in lung bases are similar. The lungs are otherwise clear without pleural  effusions or focal consolidations. Trachea projects midline and there is no pneumothorax. Soft tissue planes and included osseous structures are non-suspicious. Mild approximate T6 moderate and T8 chronic appearing compression fractures. IMPRESSION: Bibasilar atelectasis/ scarring. Electronically Signed   By: Awilda Metroourtnay  Bloomer M.D.   On: 02/13/2015 00:21   Mr Brain Wo Contrast  02/13/2015  CLINICAL DATA:  Ataxia.  History of traumatic brain injury EXAM: MRI HEAD WITHOUT CONTRAST TECHNIQUE: Multiplanar, multiecho pulse sequences of the brain and surrounding structures were obtained without intravenous contrast. COMPARISON:  CT head 02/12/2015 FINDINGS: Prior craniotomy on the left. Encephalomalacia in the left temporal lobe extending into the frontal and parietal lobe is unchanged from prior studies. There is enlargement of the left lateral ventricle due to the encephalomalacia in the left hemisphere. These findings are stable. This may be due to prior infarct, trauma, or resection. Chronic blood products in the left subdural space. Generalized atrophy.  Negative for acute infarct. Negative for mass lesion Sinus mucosal disease disease with diffuse mucosal thickening. IMPRESSION: Chronic encephalomalacia in the left hemisphere unchanged from prior studies. Chronic subdural blood products on the left. There is chronic enlargement of left lateral ventricle No acute abnormality Electronically Signed   By: Marlan Palauharles  Clark M.D.   On: 02/13/2015 15:18    Scheduled Meds: . amantadine  100 mg Oral BID  . bromocriptine  5 mg Oral BID  . divalproex  1,000 mg Oral BID  . docusate sodium  100 mg Oral BID  . escitalopram  10 mg Oral Daily  . levETIRAcetam  500 mg Oral BID  . Linaclotide  145 mcg Oral Daily  . metoprolol tartrate  12.5 mg Oral BID  . pantoprazole  40 mg Oral Daily  . QUEtiapine  100 mg Oral BID  . QUEtiapine  200 mg Oral QHS  . spironolactone  50 mg Oral Daily   Continuous Infusions:   Active  Problems:   TBI (traumatic brain injury) (HCC)   Ataxia   Encephalopathy acute   Altered mental status    Time spent: 30 minutes    Vassie LollMadera, Keiyon Plack  [facility] Pager [phone]. If 7PM-7AM, please contact night-coverage at [email or web address], password [email or web address] 02/14/2015, 6:20 PM

## 2015-02-14 NOTE — Progress Notes (Signed)
OT Cancellation Note  Patient Details Name: Hector NianRobert W [name] MRN: [id number] DOB: 1970-12-23   Cancelled Treatment:    Reason Eval/Treat Not Completed: Patient at procedure or test/ unavailable - pt with another provider.  Will reattempt.   Angelene GiovanniConarpe, Iliza Blankenbeckler M  Shenay Torti Ocostaonarpe, OTR/L [phone]  02/14/2015, 11:29 AM

## 2015-02-14 NOTE — Telephone Encounter (Signed)
Pt/[facility] and will call to possibly reschedule appt at a later date

## 2015-02-15 DIAGNOSIS — S069X0D Unspecified intracranial injury without loss of consciousness, subsequent encounter: Secondary | ICD-10-CM | POA: Diagnosis not present

## 2015-02-15 DIAGNOSIS — K7030 Alcoholic cirrhosis of liver without ascites: Secondary | ICD-10-CM | POA: Diagnosis not present

## 2015-02-15 DIAGNOSIS — R270 Ataxia, unspecified: Secondary | ICD-10-CM | POA: Diagnosis not present

## 2015-02-15 DIAGNOSIS — G9340 Encephalopathy, unspecified: Secondary | ICD-10-CM | POA: Diagnosis not present

## 2015-02-15 DIAGNOSIS — E860 Dehydration: Secondary | ICD-10-CM | POA: Insufficient documentation

## 2015-02-15 DIAGNOSIS — N179 Acute kidney failure, unspecified: Secondary | ICD-10-CM

## 2015-02-15 MED ORDER — QUETIAPINE FUMARATE 100 MG PO TABS: 100.0000 mg | ORAL_TABLET | Freq: Three times a day (TID) | ORAL | Status: DC

## 2015-02-15 MED ORDER — CLONAZEPAM 0.5 MG PO TABS: 0.5000 mg | ORAL_TABLET | Freq: Two times a day (BID) | ORAL | Status: DC | PRN

## 2015-02-15 MED ORDER — OXYCODONE-ACETAMINOPHEN 5-325 MG PO TABS: 1.0000 | ORAL_TABLET | Freq: Four times a day (QID) | ORAL | Status: DC | PRN

## 2015-02-15 MED ORDER — DIVALPROEX SODIUM ER 500 MG PO TB24: 1000.0000 mg | ORAL_TABLET | Freq: Two times a day (BID) | ORAL | Status: DC

## 2015-02-15 NOTE — Discharge Summary (Signed)
Physician Discharge Summary  Osha Errico Balicki ZOX:[id number] DOB: 11-23-1970 DOA: 02/12/2015  PCP: Ulyess Blossom, MD  Admit date: 02/12/2015 Discharge date: 02/15/2015  Time spent: 35 minutes  Recommendations for Outpatient Follow-up:  1. CBC to follow platelets and Hgb count 2. Repeat BMET to follow electrolytes and renal function   Discharge Diagnoses:  Active Problems:   TBI (traumatic brain injury) (HCC)   Ataxia   Encephalopathy acute   Altered mental status Physical deconditioning  Cirrhosis  Anxiety/depression  Tremors   Discharge Condition: stable and improved. Patient discharge home with [facility] services. Will follow up with PCP and neurology service as an outpatient   Diet recommendation: heart healthy diet (to manage sodium intake)  Filed Weights   02/15/15 0900  Weight: 60.782 kg (134 lb)    History of present illness:  44 y.o. male   has a past medical history of TBI (traumatic brain injury) (HCC); Seizure (HCC); DVT (deep venous thrombosis) (HCC); H/O renal calculi; Cirrhosis (HCC); Depression; GERD (gastroesophageal reflux disease); and H/O ETOH abuse.  Presented with confusion and generalized fatigue for the past 1 week he have decreased PO intake for past 4 days and has been having staggering gait. He has been tremulous unable to feed self. Was seen for this on the 13th have received IV fluids and was discharged to home but unfortunately his fatigue and staggering gait did not improve his father who stated he feels that his walking has gotten worse. Patient has been more confused and now has had severe tremors. Family endorses some constipation which they have treated with Maalox milk of magnesia and enemas we will as bowel movement being 2 days ago. In emergency department was noted to have creatinine 1.57 which had been stable for past 3-4 months Otherwise for blood cell count normal 6.1 with chills were noted to be down to 85 which is somewhat below his  baseline of around 120s in July of note patient does has history of cirrhosis. Of note ammonia level was 29. divalproex level was therapeutic. UA showed no evidence of UTI  Hospital Course:  1-confusion/AMS: etiology unclear but suspected that might be secondary to polypharmacy -LP unsuccessful, and patient is afebrile, no WBC's elevation or with signs of infection currently. Was decided not to pursuit further LP attempts -neurology recommended MRI which demonstrated no acute abnormalities and his symptoms improved with fluid resuscitation and supportive care. rec's were to continue current medication regimen and ask to follow up with outpatient neurology -follow PT/OT rec's will set up [facility] services at discharge -at discharge AMS resolved and patient mentation at baseline   2-TBI/prior alcohol abuse and epilepsy: findings of chronic encephalomalacia on images studies -could be progression of brain damage from prior alcohol abuse -EEG no done as per neurology service will no change therapy  -will continue current meds  3-dehydration: resolved with IVF's resuscitation -family and patient instructed about importance for good hydration   4-acute kidney injury: due to pre-renal azotemia and continue use of diuretics -resolved with IVF's -BMET to be reassess at follow up visit  -will resume diuretics at discharge  5-dysphagia: will follow dysphagia 3 diet and thin liquids  6-hx of cirrhosis: no icterus and no ascites on exam -will continue aldactone and lasix -family advise on need for adequate hydration to prevent intravascular decrease perfusion  -due to prior hx of alcohol abuse -no overt bleeding  7-Depression/anxiety: continue home psych regimen -no SI, no hallucinations, no agitation  Procedures:  See below  for x-ray reports   Consultations:  Neurology   Discharge Exam: Filed Vitals:   02/15/15 0523  BP: 94/63  Pulse: 65  Temp: 97.5 F (36.4 C)  Resp: 18     General: Afebrile, able to follow commands, no acute distress. Improved tremors and barely appreciated at discharge   Cardiovascular: S1 and S2, no rubs or gallops  Respiratory: CTA bilaterally  Abdomen: soft, NT, ND, positive BS  Musculoskeletal: no edema, no cyanosis   Discharge Instructions   Discharge Instructions    Diet - low sodium heart healthy    Complete by:  As directed      Discharge instructions    Complete by:  As directed   Maintain adequate hydration Take medications as prescribed Arrange follow up with neurologist as an outpatient (in the next 2 weeks) Arrange follow up with PCP in 10 days          Current Discharge Medication List    CONTINUE these medications which have CHANGED   Details  clonazePAM (KLONOPIN) 0.5 MG tablet Take 1 tablet (0.5 mg total) by mouth every 12 (twelve) hours as needed for anxiety (anxiety).    divalproex (DEPAKOTE ER) 500 MG 24 hr tablet Take 2 tablets (1,000 mg total) by mouth 2 (two) times daily.    oxyCODONE-acetaminophen (PERCOCET/ROXICET) 5-325 MG tablet Take 1 tablet by mouth every 6 (six) hours as needed for moderate pain or severe pain.    QUEtiapine (SEROQUEL) 100 MG tablet Take 1-2 tablets (100-200 mg total) by mouth 3 (three) times daily. 1 tab in the morning and afternoon, 2 tabs at bedtime Qty: 90 tablet, Refills: 0      CONTINUE these medications which have NOT CHANGED   Details  amantadine (SYMMETREL) 100 MG capsule Take 100 mg by mouth 2 (two) times daily.    bromocriptine (PARLODEL) 2.5 MG tablet Take 5 mg by mouth 2 (two) times daily.    docusate sodium (COLACE) 100 MG capsule Take 100 mg by mouth 2 (two) times daily.    escitalopram (LEXAPRO) 10 MG tablet Take 10 mg by mouth daily.    furosemide (LASIX) 20 MG tablet Take 20 mg by mouth daily.    levETIRAcetam (KEPPRA) 250 MG tablet Take 500 mg by mouth 2 (two) times daily.    Linaclotide (LINZESS) 145 MCG CAPS capsule Take 145 mcg by mouth  daily.    metoprolol tartrate (LOPRESSOR) 25 MG tablet Take 12.5 mg by mouth 2 (two) times daily.    ondansetron (ZOFRAN) 4 MG tablet Take 1 tablet (4 mg total) by mouth every 6 (six) hours. Qty: 12 tablet, Refills: 0    pantoprazole (PROTONIX) 40 MG tablet Take 40 mg by mouth daily.    polyethylene glycol (MIRALAX / GLYCOLAX) packet Take 17 g by mouth daily as needed for mild constipation.    spironolactone (ALDACTONE) 50 MG tablet Take 50 mg by mouth daily.      STOP taking these medications     chlorhexidine (PERIDEX) 0.12 % solution      Maltodextrin-Xanthan Gum (RESOURCE THICKENUP CLEAR) POWD        Allergies  Allergen Reactions  . Ativan [Lorazepam]     Pt has paradoxical effect with sedating medications.   . Lactulose Hives   Follow-up Information    Follow up with MORGAN, [name], MD. Schedule an appointment as soon as possible for a visit in 10 days.   Specialty:  Family Medicine   Contact information:   [address] [place] [id number] [phone]       The results of significant diagnostics from this hospitalization (including imaging, microbiology, ancillary and laboratory) are listed below for reference.    Significant Diagnostic Studies: Dg Chest 2 View  02/13/2015  CLINICAL DATA:  History of traumatic brain injury and seizures, confusion and weakness for 1 week. Decline an activities of daily living. EXAM: CHEST  2 VIEW COMPARISON:  Chest radiograph February 06, 2015 in chest radiograph June 25, 2014 FINDINGS: Cardiomediastinal silhouette is normal. Linear densities in lung bases are similar. The lungs are otherwise clear without pleural effusions or focal consolidations. Trachea projects midline and there is no pneumothorax. Soft tissue planes and included osseous structures are non-suspicious. Mild approximate T6 moderate and T8 chronic appearing compression fractures. IMPRESSION: Bibasilar atelectasis/ scarring. Electronically Signed   By:  Awilda Metroourtnay  Bloomer M.D.   On: 02/13/2015 00:21   Ct Head Wo Contrast  02/12/2015  CLINICAL DATA:  Gait abnormality, confusion, history of traumatic brain injury EXAM: CT HEAD WITHOUT CONTRAST TECHNIQUE: Contiguous axial images were obtained from the base of the skull through the vertex without intravenous contrast. COMPARISON:  06/26/2014 FINDINGS: Again noted atrophy and ventriculomegaly in left parietal and left temporal lobe. Encephalomalacia in upper aspect of left parietal lobe again noted. No intracranial hemorrhage, mass effect or midline shift. Ventricular size is stable from prior exam. No acute cortical infarction. No mass lesion is noted on this unenhanced scan. IMPRESSION: 1. No acute intracranial abnormality. Stable atrophy in left parietal and left temporal lobe. Ventriculomegaly on the left side again noted. No significant change. Electronically Signed   By: Natasha MeadLiviu  Pop M.D.   On: 02/12/2015 17:53   Mr Brain Wo Contrast  02/13/2015  CLINICAL DATA:  Ataxia.  History of traumatic brain injury EXAM: MRI HEAD WITHOUT CONTRAST TECHNIQUE: Multiplanar, multiecho pulse sequences of the brain and surrounding structures were obtained without intravenous contrast. COMPARISON:  CT head 02/12/2015 FINDINGS: Prior craniotomy on the left. Encephalomalacia in the left temporal lobe extending into the frontal and parietal lobe is unchanged from prior studies. There is enlargement of the left lateral ventricle due to the encephalomalacia in the left hemisphere. These findings are stable. This may be due to prior infarct, trauma, or resection. Chronic blood products in the left subdural space. Generalized atrophy.  Negative for acute infarct. Negative for mass lesion Sinus mucosal disease disease with diffuse mucosal thickening. IMPRESSION: Chronic encephalomalacia in the left hemisphere unchanged from prior studies. Chronic subdural blood products on the left. There is chronic enlargement of left lateral  ventricle No acute abnormality Electronically Signed   By: Marlan Palauharles  Clark M.D.   On: 02/13/2015 15:18   Dg Abd Acute W/chest  02/06/2015  CLINICAL DATA:  Abdominal pain and weakness. Cirrhosis. Tobacco use. EXAM: DG ABDOMEN ACUTE W/ 1V CHEST COMPARISON:  11/22/2014 FINDINGS: Stable scarring stable scarring at the left lung base. Scarring or subsegmental atelectasis at the right lung base. Cardiac and mediastinal margins appear normal. No free intraperitoneal gas beneath the hemidiaphragms. No significant abnormal air- fluid levels in the upper abdomen. Prominent stool throughout the colon favors constipation. No dilated small bowel. IMPRESSION: 1.  Prominent stool throughout the colon favors constipation. 2. Left basilar scarring. Right basilar subsegmental atelectasis or scarring. Electronically Signed   By: Gaylyn RongWalter  Liebkemann M.D.   On: 02/06/2015 17:41    Microbiology: Recent Results (from the past 240 hour(s))  MRSA PCR Screening     Status: None   Collection Time:  02/13/15  2:28 AM  Result Value Ref Range Status   MRSA by PCR NEGATIVE NEGATIVE Final    Comment:        The GeneXpert MRSA Assay (FDA approved for NASAL specimens only), is one component of a comprehensive MRSA colonization surveillance program. It is not intended to diagnose MRSA infection nor to guide or monitor treatment for MRSA infections.      Labs: Basic Metabolic Panel:  Recent Labs Lab 02/12/15 1716 02/13/15 0540 02/14/15 0520  NA 142 141 142  K 4.6 4.3 4.4  CL 102 107 104  CO2 GLUCOSE 110* 86 85  BUN 24* 20 17  CREATININE 1.57* 1.24 1.13  CALCIUM 10.1 8.9 9.5  MG  --  1.9  --   PHOS  --  3.8  --    Liver Function Tests:  Recent Labs Lab 02/12/15 1716 02/13/15 0540  AST 38 28  ALT 18 13*  ALKPHOS 81 63  BILITOT 1.0 0.8  PROT 8.3* 6.4*  ALBUMIN 4.6 3.4*    Recent Labs Lab 02/12/15 1801  AMMONIA 29   CBC:  Recent Labs Lab 02/12/15 1716 02/13/15 0540 02/14/15 0520   WBC 6.1 5.9 5.2  NEUTROABS 2.9  --   --   HGB 14.5 12.7* 13.9  HCT 43.6 38.4* 42.2  MCV 97.8 98.2 97.9  PLT 85* 70* 74*   CBG:  Recent Labs Lab 02/12/15 1655  GLUCAP 91    Signed:  Vassie Loll  [facility] 02/15/2015, 2:04 PM

## 2015-02-15 NOTE — Care Management Note (Signed)
Case Management Note  Patient Details  Name: Hector NianRobert W [name] MRN: [id number] Date of Birth: Mar 07, 1971  Subjective/Objective:   Ataxia, Encephalopathy acute, Altered mental status                 Action/Plan: NCM spoke to pt and parents in room, Hector [name] # [phone]. [facility] Pt's father states pt has been to outpt PT several times, but his sessions are 30 mins each, and once they get him set it usually ends up being only 15 mins. Contacted [facility] and ordered RW for home. [facility]     Expected Discharge Date:  02/15/15               Expected Discharge Plan:  Home w Home Health Services  In-House Referral:     Discharge planning Services  CM Consult  Post Acute Care Choice:  Home Health Choice offered to:  Parent  DME Arranged:  Dan HumphreysWalker rolling DME Agency:  [facility]  HH Arranged:  [facility] Agency[facility]  [facility]  Status of Service:  Completed, signed off  Medicare Important Message Given:    Date Medicare IM Given:    Medicare IM give by:    Date Additional Medicare IM Given:    Additional Medicare Important Message give by:     If discussed at Long Length of Stay Meetings, dates discussed:    Additional Comments:  Hector [name], Hector Berkovich Ellen, RN 02/15/2015, 3:01 PM

## 2015-02-15 NOTE — Progress Notes (Signed)
Nurse reviewed discharge instructions with pt and his father.  Pt and father verbalized understanding of discharge instructions and follow up appointments. No concerns at time of discharge.

## 2015-02-17 ENCOUNTER — Ambulatory Visit: Payer: Self-pay | Admitting: Neurology

## 2015-02-17 NOTE — Progress Notes (Signed)
Occupational Therapy Evaluation Addendum:    02/13/15 0800  OT G-codes **NOT FOR INPATIENT CLASS**  Functional Limitation Self care  Self Care Current Status [phone]([id number]) CK  Self Care Goal Status ([id number]) Trinna BalloonJ  Tanetta Fuhriman, OTR/L [phone]

## 2015-02-18 ENCOUNTER — Encounter: Payer: Self-pay | Admitting: Neurology

## 2015-02-18 ENCOUNTER — Ambulatory Visit (INDEPENDENT_AMBULATORY_CARE_PROVIDER_SITE_OTHER): Payer: Medicaid Other | Admitting: Neurology

## 2015-02-18 VITALS — BP 98/72 | HR 77 | Wt 134.0 lb

## 2015-02-18 DIAGNOSIS — G40209 Localization-related (focal) (partial) symptomatic epilepsy and epileptic syndromes with complex partial seizures, not intractable, without status epilepticus: Secondary | ICD-10-CM | POA: Diagnosis not present

## 2015-02-18 DIAGNOSIS — S069X5D Unspecified intracranial injury with loss of consciousness greater than 24 hours with return to pre-existing conscious level, subsequent encounter: Secondary | ICD-10-CM | POA: Diagnosis not present

## 2015-02-18 DIAGNOSIS — R4182 Altered mental status, unspecified: Secondary | ICD-10-CM | POA: Diagnosis not present

## 2015-02-18 DIAGNOSIS — R251 Tremor, unspecified: Secondary | ICD-10-CM

## 2015-02-18 DIAGNOSIS — E860 Dehydration: Secondary | ICD-10-CM

## 2015-02-18 DIAGNOSIS — Z79899 Other long term (current) drug therapy: Secondary | ICD-10-CM

## 2015-02-18 NOTE — Progress Notes (Signed)
Chart sent.  
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
## 2015-02-18 NOTE — Patient Instructions (Addendum)
Plan is to get off of Keppra and depakote and instead be on Lamictal. 1.  Continue Keppra at current dose (500mg  twice daily) 2.  First, we will taper off of Depakote.  Take 500mg  in AM and 1000mg  at night for 7 days,         Then 500mg  twice daily for 7 days,         Then 500mg  at bedtime for 7 days, then STOP 3.  At that point, I want to see you back here for the next step 4.  I would discuss with Dr. Lequita HaltMorgan regarding stopping some medications which may be causing side effects.  Don't take the nausea medications if not nauseous. Ask why he is taking Seroquel at night.  If it is only to help sleep, then ask about stopping it and trying melatonin instead.

## 2015-03-18 ENCOUNTER — Ambulatory Visit (INDEPENDENT_AMBULATORY_CARE_PROVIDER_SITE_OTHER): Payer: Medicaid Other | Admitting: Neurology

## 2015-03-18 ENCOUNTER — Encounter: Payer: Self-pay | Admitting: Neurology

## 2015-03-18 VITALS — BP 96/68 | HR 60 | Wt 130.0 lb

## 2015-03-18 DIAGNOSIS — R251 Tremor, unspecified: Secondary | ICD-10-CM

## 2015-03-18 DIAGNOSIS — S069X5D Unspecified intracranial injury with loss of consciousness greater than 24 hours with return to pre-existing conscious level, subsequent encounter: Secondary | ICD-10-CM

## 2015-03-18 DIAGNOSIS — Z720 Tobacco use: Secondary | ICD-10-CM | POA: Diagnosis not present

## 2015-03-18 DIAGNOSIS — G40209 Localization-related (focal) (partial) symptomatic epilepsy and epileptic syndromes with complex partial seizures, not intractable, without status epilepticus: Secondary | ICD-10-CM | POA: Diagnosis not present

## 2015-03-18 MED ORDER — LAMOTRIGINE ER 200 MG PO TB24: 200.0000 mg | ORAL_TABLET | Freq: Every day | ORAL | Status: DC

## 2015-03-18 MED ORDER — LAMOTRIGINE ER 50 MG PO TB24: ORAL_TABLET | ORAL | Status: DC

## 2015-03-18 NOTE — Patient Instructions (Addendum)
1.  Lamotrigine XR (Lamictal)  Week 1&2: 50g daily Week 3&4: 100 daily Week 5 and thereafter  200 mg daily Call us if you notice a new rash  2.  Starting Week 6, check trough lamotrigine level 3.  Continue Keppra for now. 4.  Follow up in 6 to 7 weeks (about 5 to 7 days after lamotrigine level is drawn)

## 2015-03-18 NOTE — Progress Notes (Addendum)
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  Records, labs and CT of head reviewed.  He is accompanied by his father who provides some history.  UPDATE: The goal is to switch him from Keppra and Depakote to Lamictal.  I want to discontinue Keppra due to irritability and I want to discontinue Depakote due to worsening tremor.  Last month we tapered him off of Depakote while remaining on Keppra.  Since discontinuing Depakote, he is doing much better.  He is ambulating.  Tremors have improved and he is able to feed himself.  He is still agitated from time to time.  HISTORY: On 08/12/13, he had a seizure while in a convenience store.  He fell, hitting his head on the pavement.  He was brought to the ED at [facility].  CT of the head showed a large left subdural hematoma with subarachnoid blood over the left convexity with a 12 mm left to right shift.  He underwent emergent craniotomy.  He was in an induced coma for 4-5 weeks.  His hospital course was complicated by development of a DVT secondary to PICC, requiring heparin drip, VDRF status post extubation, and pneumonia.  He is still taking Xarelto.  He had a G-tube placed for nutrition.  He followed up with an neurologist, Dr. Caswell Corwin.  He was placed on Zoloft for agitation and outbursts.  He presented to the ED at [facility] on 12/06/13 for new shaking episodes with extreme agitation and combativeness.  It seemed to have occurred after taking a dose of risperidone.  CT of the head from 12/06/13, showed post-surgical changes, with interval clearing of left extra-axial fluid collection, mild increase in ventricular size (left greater than right), hypodense areas involving the left cerebrum and no midline  shift or hemorrhage.  He was found to have HCAP and sepsis and was treated with antibiotics.  He was started on Lexapro.  Keppra was switched to valproic acid.  He was just discharged from [facility] over the weekend for 1 week of confusion and generalized fatigue, staggering gait, tremulousness and decreased oral intake.  He had been experiencing constipation, which was treated with laxatives.  An LP was attempted, but unsuccessful.  Since he was afebrile without leukocytosis, further pursuit was not attempted.  He was found to have acute on chronic renal injury due to pre-renal azotemia and diuretics.  He was treated with IV fluids. Cr was stable at 1.57.  Depakote level was 98.  MRI of the brain showed no acute abnormalities.  Depakote ER was increased to  twice daily.  He also continues to take Keppra  twice daily, although it was supposed to have been discontinued last year.  He still exhibits tremors and he has reduced appetite.  There has been no seizures that parents are aware of.    He resides at [facility].  He has regained much more strength in the right upper extremity.  His mood is stable.  Recently, his PEG and trach was removed.  He has an unsteady gait but ambulates with other people around.  He has global aphasia.  He finished PT/OT and is currently still undergoing speech therapy.  He has had no further seizures.  He denies headache  He has a history of  alcoholism.  Prior to the accident, he had two other seizures over a 5-6 year period.  It was attributed to his drinking.  He was never taking an antiepileptic medication.  He has a tenth grade education.  Prior to the accident, he had his own apartment.  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  PAST MEDICAL HISTORY: Past Medical History  Diagnosis Date  . TBI (traumatic brain injury) (HCC)     07/2013, fall from standing post seizure  . Seizure (HCC)   . DVT (deep venous  thrombosis) (HCC)     UE, LE DVT during hospitalization  . H/O renal calculi   . Cirrhosis (HCC)   . Depression   . GERD (gastroesophageal reflux disease)   . H/O ETOH abuse     MEDICATIONS: Current Outpatient Prescriptions on File Prior to Visit  Medication Sig Dispense Refill  . amantadine (SYMMETREL) 100 MG capsule Take 100 mg by mouth 2 (two) times daily.    . bromocriptine (PARLODEL) 2.5 MG tablet Take 5 mg by mouth 2 (two) times daily.    . clonazePAM (KLONOPIN) 0.5 MG tablet Take 1 tablet (0.5 mg total) by mouth every 12 (twelve) hours as needed for anxiety (anxiety).    Marland Kitchen divalproex (DEPAKOTE ER) 500 MG 24 hr tablet Take 2 tablets (1,000 mg total) by mouth 2 (two) times daily.    Marland Kitchen docusate sodium (COLACE) 100 MG capsule Take 100 mg by mouth 2 (two) times daily.    Marland Kitchen escitalopram (LEXAPRO) 10 MG tablet Take 10 mg by mouth daily.    . furosemide (LASIX) 20 MG tablet Take 20 mg by mouth daily.    Marland Kitchen levETIRAcetam (KEPPRA) 250 MG tablet Take 500 mg by mouth 2 (two) times daily.    . Linaclotide (LINZESS) 145 MCG CAPS capsule Take 145 mcg by mouth daily.    . metoprolol tartrate (LOPRESSOR) 25 MG tablet Take 12.5 mg by mouth 2 (two) times daily.    . ondansetron (ZOFRAN) 4 MG tablet Take 1 tablet (4 mg total) by mouth every 6 (six) hours. 12 tablet 0  . oxyCODONE-acetaminophen (PERCOCET/ROXICET) 5-325 MG tablet Take 1 tablet by mouth every 6 (six) hours as needed for moderate pain or severe pain.    . pantoprazole (PROTONIX) 40 MG tablet Take 40 mg by mouth daily.    . polyethylene glycol (MIRALAX / GLYCOLAX) packet Take 17 g by mouth daily as needed for mild constipation.    . QUEtiapine (SEROQUEL) 100 MG tablet Take 1-2 tablets (100-200 mg total) by mouth 3 (three) times daily. 1 tab in the morning and afternoon, 2 tabs at bedtime 90 tablet 0  . spironolactone (ALDACTONE) 50 MG tablet Take 50 mg by mouth daily.     No current facility-administered medications on file prior to visit.     ALLERGIES: Allergies  Allergen Reactions  . Ativan [Lorazepam]     Pt has paradoxical effect with sedating medications.   . Lactulose Hives    FAMILY HISTORY: Family History  Problem Relation Age of Onset  . Congestive Heart Failure Brother     Deceased age 39  . Hypercholesterolemia Mother   . Hypertension Mother     SOCIAL HISTORY: Social History   Social History  . Marital Status: Single    Spouse Name: N/A  . Number of Children: N/A  . Years of Education: N/A   Occupational History  . Not on file.   Social History Main Topics  .  Smoking status: Current Some Day Smoker -- 0.25 packs/day for 20 years  . Smokeless tobacco: Never Used  . Alcohol Use: No  . Drug Use: No  . Sexual Activity: Not on file   Other Topics Concern  . Not on file   Social History Narrative    REVIEW OF SYSTEMS: Constitutional: No fevers, chills, or sweats, no generalized fatigue, change in appetite Eyes: No visual changes, double vision, eye pain Ear, nose and throat: No hearing loss, ear pain, nasal congestion, sore throat Cardiovascular: No chest pain, palpitations Respiratory:  No shortness of breath at rest or with exertion, wheezes GastrointestinaI: No nausea, vomiting, diarrhea, abdominal pain, fecal incontinence Genitourinary:  No dysuria, urinary retention or frequency Musculoskeletal:  No neck pain, back pain Integumentary: No rash, pruritus, skin lesions Neurological: as above Psychiatric: No depression, insomnia, anxiety Endocrine: No palpitations, fatigue, diaphoresis, mood swings, change in appetite, change in weight, increased thirst Hematologic/Lymphatic:  No anemia, purpura, petechiae. Allergic/Immunologic: no itchy/runny eyes, nasal congestion, recent allergic reactions, rashes  PHYSICAL EXAM: Filed Vitals:   03/18/15 0914  BP: 96/68  Pulse: 60   General: No acute distress.  Patient appears well-groomed.  normal body habitus. Head:   Normocephalic/atraumatic Eyes:  Fundoscopic exam unremarkable without vessel changes, exudates, hemorrhages or papilledema. Neck: supple, no paraspinal tenderness, full range of motion Heart:  Regular rate and rhythm Lungs:  Clear to auscultation bilaterally Back: No paraspinal tenderness Neurological Exam: alert, unable to assess orientation, recent and remote memory, fund of knowledge, attention and concentration. Speech fluent and not dysarthric, but unintelligible.  Expressive more than receptive aphasia.  Unable to name or repeat.  Follows some simple commands but not others.  Unable to write.  Right pupil 0.235mm larger than left pupil but both round and reactive to light.  Unable to assess CN V.  Blinks to threat bilaterally.  Otherwise, CN II-XII intact.  mild increased tone in right upper extremity, no fasciculations.  Motor 5-/5 on the right upper and lower extremities.  5/5 left side.  Unable to assess sensation.  DTR 3+ right upper and lower extremities.  2+ on left.  Postural and kinetic tremor of upper extremities, improved.  Wide-based gait.  IMPRESSION: Traumatic brain injury Traumatic SDH status post craniotomy Seizure disorder Tremor Tobacco abuse  PLAN: Continue Keppra for now Will start Lamictal XR titration from 50mg  daily to 200mg  daily over 5 weeks Check trough lamotrigine level on week 6 Follow up afterwards.  Instructed to monitor for rash Smoking cessation  15 minutes spent face to face with patient, over 50% spent discussing management and medication side effect.  Shon MilletAdam Paizleigh Wilds, DO  CC:  Darin EngelsMelvin Morgan, MD

## 2015-03-18 NOTE — Progress Notes (Signed)
Chart forwarded.  

## 2015-03-25 ENCOUNTER — Telehealth: Payer: Self-pay | Admitting: Neurology

## 2015-03-25 DIAGNOSIS — Z79899 Other long term (current) drug therapy: Secondary | ICD-10-CM

## 2015-03-25 MED ORDER — OXCARBAZEPINE 150 MG PO TABS: ORAL_TABLET | ORAL | Status: DC

## 2015-03-25 NOTE — Telephone Encounter (Signed)
Pt/Mother Hector [name]/ called concerning possible reaction to med/ Lamicta xr 50mg / has facial and mouth rashes/ call back @ [phone]

## 2015-03-25 NOTE — Telephone Encounter (Signed)
Medication sent in. Harriett SineNancy aware of instructions.

## 2015-03-25 NOTE — Telephone Encounter (Signed)
1.  Stop Lamictal 2.  Instead, we will start oxcarbazepine:  He is to take 150mg  twice daily for 7 days,  Then to take 300mg  twice daily. 3.  When he starts the oxcarbazepine 300mg  twice daily, he may discontinue Keppra.   4.  I want to check a BMP prior to follow up.

## 2015-03-25 NOTE — Telephone Encounter (Signed)
Pt has been on Lamictal for 6 days. Mother, Hector [name], calls to report a rash (which is a side effect listed on insert) that has developed around his mouth and on his face. Hector [name] is complaining of rash hurting. Hector [name] did note that she believes Hector [name] is more alert and staggering less on medication. Please advise.

## 2015-04-09 ENCOUNTER — Emergency Department (HOSPITAL_COMMUNITY)
Admission: EM | Admit: 2015-04-09 | Discharge: 2015-04-10 | Disposition: A | Discharge status: Home / Self Care | Payer: Medicaid Other | Attending: Emergency Medicine | Admitting: Emergency Medicine

## 2015-04-09 ENCOUNTER — Encounter (HOSPITAL_COMMUNITY): Payer: Self-pay | Admitting: Emergency Medicine

## 2015-04-09 DIAGNOSIS — Z87820 Personal history of traumatic brain injury: Secondary | ICD-10-CM | POA: Diagnosis not present

## 2015-04-09 DIAGNOSIS — K219 Gastro-esophageal reflux disease without esophagitis: Secondary | ICD-10-CM | POA: Insufficient documentation

## 2015-04-09 DIAGNOSIS — F17200 Nicotine dependence, unspecified, uncomplicated: Secondary | ICD-10-CM | POA: Diagnosis not present

## 2015-04-09 DIAGNOSIS — F329 Major depressive disorder, single episode, unspecified: Secondary | ICD-10-CM | POA: Insufficient documentation

## 2015-04-09 DIAGNOSIS — T421X5A Adverse effect of iminostilbenes, initial encounter: Secondary | ICD-10-CM | POA: Insufficient documentation

## 2015-04-09 DIAGNOSIS — Z86718 Personal history of other venous thrombosis and embolism: Secondary | ICD-10-CM | POA: Insufficient documentation

## 2015-04-09 DIAGNOSIS — R251 Tremor, unspecified: Secondary | ICD-10-CM | POA: Diagnosis not present

## 2015-04-09 DIAGNOSIS — Z87442 Personal history of urinary calculi: Secondary | ICD-10-CM | POA: Insufficient documentation

## 2015-04-09 DIAGNOSIS — Z79899 Other long term (current) drug therapy: Secondary | ICD-10-CM | POA: Insufficient documentation

## 2015-04-09 LAB — CBC WITH DIFFERENTIAL/PLATELET
BASOS ABS: 0.1 10*3/uL (ref 0.0–0.1)
BASOS PCT: 1 %
EOS ABS: 0.2 10*3/uL (ref 0.0–0.7)
EOS PCT: 2 %
HCT: 43.6 % (ref 39.0–52.0)
HEMOGLOBIN: 14.6 g/dL (ref 13.0–17.0)
LYMPHS ABS: 2.3 10*3/uL (ref 0.7–4.0)
Lymphocytes Relative: 30 %
MCH: 32.4 pg (ref 26.0–34.0)
MCHC: 33.5 g/dL (ref 30.0–36.0)
MCV: 96.7 fL (ref 78.0–100.0)
Monocytes Absolute: 0.8 10*3/uL (ref 0.1–1.0)
Monocytes Relative: 10 %
NEUTROS PCT: 57 %
Neutro Abs: 4.4 10*3/uL (ref 1.7–7.7)
PLATELETS: 132 10*3/uL — AB (ref 150–400)
RBC: 4.51 MIL/uL (ref 4.22–5.81)
RDW: 12.8 % (ref 11.5–15.5)
WBC: 7.8 10*3/uL (ref 4.0–10.5)

## 2015-04-09 LAB — COMPREHENSIVE METABOLIC PANEL
ALT: 20 U/L (ref 17–63)
ANION GAP: 7 (ref 5–15)
AST: 23 U/L (ref 15–41)
Albumin: 3.9 g/dL (ref 3.5–5.0)
Alkaline Phosphatase: 118 U/L (ref 38–126)
BUN: 19 mg/dL (ref 6–20)
CALCIUM: 9.7 mg/dL (ref 8.9–10.3)
CHLORIDE: 101 mmol/L (ref 101–111)
CO2: 31 mmol/L (ref 22–32)
Creatinine, Ser: 1.46 mg/dL — ABNORMAL HIGH (ref 0.61–1.24)
GFR calc non Af Amer: 56 mL/min — ABNORMAL LOW (ref >60)
Glucose, Bld: 98 mg/dL (ref 65–99)
POTASSIUM: 3.7 mmol/L (ref 3.5–5.1)
SODIUM: 139 mmol/L (ref 135–145)
Total Bilirubin: 0.4 mg/dL (ref 0.3–1.2)
Total Protein: 7.5 g/dL (ref 6.5–8.1)

## 2015-04-09 LAB — VALPROIC ACID LEVEL: Valproic Acid Lvl: <10 ug/mL — ABNORMAL LOW (ref 50.0–100.0)

## 2015-04-09 NOTE — ED Notes (Signed)
Pt from home per EMS, was called today after his family began to notice tremors starting yesterday.  He has a hx of TBI and is non-verbal w/ the exception of of "no, I don't know and dam".  Family on their way in.

## 2015-04-09 NOTE — ED Provider Notes (Signed)
CSN: [id number]     Arrival date & time 04/09/15  2125 History   First MD Initiated Contact with Patient 04/09/15 2156     Chief Complaint  Patient presents with  . Tremors     (Consider location/radiation/quality/duration/timing/severity/associated sxs/prior Treatment) HPI Comments: Patient with history of traumatic brain injury, followed by [facility] neurolog[facility] (Dr. Everlena CooperJaffe), recently discontinued multiple antiepileptics and switched to oxcarbazepine due to tremors and irritability, however per father only started taking these in the past few days despite being prescribed these medications 3 weeks ago -- presents with acute return of tremors today. Patient was discontinued off of Depakote with much improvement in his tremor symptoms. Patient has had 2 or 3 weeks of fluid movements without tremors. Symptoms suddenly returned today. Patient is ambulatory but staggers. He is noncommunicative at baseline and has not had any change in his mental status per the patient's father who is at bedside. No other medical complaints including fever, cough, shortness of breath. No other new medications. Patient has had tonic-clonic type seizures in the past and has not had any of these. Course is constant. Nothing makes symptoms better or worse.  The history is provided by medical records and a parent.    Past Medical History  Diagnosis Date  . TBI (traumatic brain injury) (HCC)     07/2013, fall from standing post seizure  . Seizure (HCC)   . DVT (deep venous thrombosis) (HCC)     UE, LE DVT during hospitalization  . H/O renal calculi   . Cirrhosis (HCC)   . Depression   . GERD (gastroesophageal reflux disease)   . H/O ETOH abuse    Past Surgical History  Procedure Laterality Date  . Tracheostomy      feinstein  . Gastrostomy tube placement    . Removal of gastrostomy tube    . Brain surgery     Family History  Problem Relation Age of Onset  . Congestive Heart Failure Brother     Deceased age 46   . Hypercholesterolemia Mother   . Hypertension Mother    Social History  Substance Use Topics  . Smoking status: Current Some Day Smoker -- 0.25 packs/day for 20 years  . Smokeless tobacco: Never Used  . Alcohol Use: No    Review of Systems  Unable to perform ROS: Patient nonverbal      Allergies  Ativan and Lactulose  Home Medications   Prior to Admission medications   Medication Sig Start Date End Date Taking? Authorizing Provider  amantadine (SYMMETREL) 100 MG capsule Take 100 mg by mouth 2 (two) times daily.    Historical Provider, MD  bromocriptine (PARLODEL) 2.5 MG tablet Take 5 mg by mouth 2 (two) times daily.    Historical Provider, MD  clonazePAM (KLONOPIN) 0.5 MG tablet Take 1 tablet (0.5 mg total) by mouth every 12 (twelve) hours as needed for anxiety (anxiety). 02/15/15   Vassie Lollarlos Madera, MD  divalproex (DEPAKOTE ER) 500 MG 24 hr tablet Take 2 tablets (1,000 mg total) by mouth 2 (two) times daily. 02/15/15   Vassie Lollarlos Madera, MD  docusate sodium (COLACE) 100 MG capsule Take 100 mg by mouth 2 (two) times daily.    Historical Provider, MD  escitalopram (LEXAPRO) 10 MG tablet Take 10 mg by mouth daily.    Historical Provider, MD  furosemide (LASIX) 20 MG tablet Take 20 mg by mouth daily.    Historical Provider, MD  LamoTRIgine 50 MG TB24 Take one tablet daily for 2 weeks,  then take two tablets daily for 2 weeks 03/18/15   Drema Dallas, DO  LamoTRIgine XR 200 MG TB24 Take 1 tablet (200 mg total) by mouth daily. 03/18/15   Drema Dallas, DO  levETIRAcetam (KEPPRA) 250 MG tablet Take 500 mg by mouth 2 (two) times daily.    Historical Provider, MD  Linaclotide Karlene Einstein) 145 MCG CAPS capsule Take 145 mcg by mouth daily.    Historical Provider, MD  metoprolol tartrate (LOPRESSOR) 25 MG tablet Take 12.5 mg by mouth 2 (two) times daily.    Historical Provider, MD  ondansetron (ZOFRAN) 4 MG tablet Take 1 tablet (4 mg total) by mouth every 6 (six) hours. 12/10/13   Junious Silk, PA-C   OXcarbazepine (TRILEPTAL) 150 MG tablet Take 1 tablet two times daily x 7 days, then take 2 tablets two times daily 03/25/15   Drema Dallas, DO  oxyCODONE-acetaminophen (PERCOCET/ROXICET) 5-325 MG tablet Take 1 tablet by mouth every 6 (six) hours as needed for moderate pain or severe pain. 02/15/15   Vassie Loll, MD  pantoprazole (PROTONIX) 40 MG tablet Take 40 mg by mouth daily.    Historical Provider, MD  polyethylene glycol (MIRALAX / GLYCOLAX) packet Take 17 g by mouth daily as needed for mild constipation.    Historical Provider, MD  QUEtiapine (SEROQUEL) 100 MG tablet Take 1-2 tablets (100-200 mg total) by mouth 3 (three) times daily. 1 tab in the morning and afternoon, 2 tabs at bedtime 02/15/15   Vassie Loll, MD  spironolactone (ALDACTONE) 50 MG tablet Take 50 mg by mouth daily.    Historical Provider, MD   BP 140/80 mmHg  Pulse 74  Temp(Src) 98.4 F (36.9 C) (Oral)  Resp 14  SpO2 99%   Physical Exam  Constitutional: He appears well-developed and well-nourished.  HENT:  Head: Normocephalic and atraumatic.  Mouth/Throat: Oropharynx is clear and moist.  Eyes: Conjunctivae are normal. Right eye exhibits no discharge. Left eye exhibits no discharge.  Neck: Normal range of motion. Neck supple.  Cardiovascular: Normal rate, regular rhythm and normal heart sounds.   No murmur heard. Pulmonary/Chest: Effort normal and breath sounds normal. No respiratory distress. He has no wheezes. He has no rales.  Abdominal: Soft. There is no tenderness.  Neurological: He is alert. He displays tremor. He exhibits normal muscle tone. GCS eye subscore is 4. GCS verbal subscore is 5. GCS motor subscore is 6.  Patient follows basic commands and is alert. He has a coarse tremor of his neck and extremities.  Skin: Skin is warm and dry.  Psychiatric: He has a normal mood and affect.  Nursing note and vitals reviewed.   ED Course  Procedures (including critical care time) Labs Review Labs Reviewed  - No data to display  Imaging Review No results found. I have personally reviewed and evaluated these images and lab results as part of my medical decision-making.   EKG Interpretation None       10:14 PM Patient seen and examined. Work-up initiated.   Vital signs reviewed and are as follows: BP 140/80 mmHg  Pulse 74  Temp(Src) 98.4 F (36.9 C) (Oral)  Resp 14  SpO2 99%  Case discussed with Dr. Elesa Massed. Workup reveals urinary tract infection, will start on Cipro, first dose given in emergency department.  I discussed results with the patient's father bedside. He is comfortable with discharge to home with close follow-up with Dr. Everlena Cooper. I have sent a note to Dr. Everlena Cooper as well, informing of ED  visit requesting close follow-up.  Encouraged family to return if symptoms worsen, if there is a change from baseline mental status, new or changing symptoms. Father verbalizes understanding and agrees with plan.  MDM   Final diagnoses:  Tremor   Patient with history of TBI, return of tremors after changing antiepileptics. Sounds like patient was prescribed oxcarbazepine several weeks ago, however only started this recently. This might potentially be exacerbating his symptoms. Medical workup was otherwise unrevealing, with the exception of urinary tract infection. Do not suspect sepsis/rigors as cause of symptoms. Do not suspect seizures as cause of symptoms. Depakote level is negative consistent with discontinuation of this medication. Patient has good neurology follow-up. Family is able to care for patient.    Renne Crigler, PA-C 04/10/15 0245  Layla Maw Ward, DO 04/10/15 9562

## 2015-04-10 ENCOUNTER — Telehealth: Payer: Self-pay

## 2015-04-10 LAB — URINE MICROSCOPIC-ADD ON

## 2015-04-10 LAB — URINALYSIS, ROUTINE W REFLEX MICROSCOPIC
Bilirubin Urine: NEGATIVE
Glucose, UA: NEGATIVE mg/dL
Hgb urine dipstick: NEGATIVE
KETONES UR: NEGATIVE mg/dL
NITRITE: POSITIVE — AB
PROTEIN: NEGATIVE mg/dL
Specific Gravity, Urine: 1.018 (ref 1.005–1.030)
pH: 6 (ref 5.0–8.0)

## 2015-04-10 MED ORDER — CIPROFLOXACIN HCL 500 MG PO TABS
500.0000 mg | ORAL_TABLET | Freq: Once | ORAL | Status: AC
  Administered 2015-04-10: 500 mg via ORAL
  Filled 2015-04-10: qty 1

## 2015-04-10 MED ORDER — CIPROFLOXACIN HCL 500 MG PO TABS: 500.0000 mg | ORAL_TABLET | Freq: Two times a day (BID) | ORAL | Status: DC

## 2015-04-10 NOTE — Telephone Encounter (Signed)
I suggest that he takes the 2:15 appointment tomorrow.  If the morning appointment cancels, he can take that.

## 2015-04-10 NOTE — Telephone Encounter (Signed)
Patient had to be transferred to E.D. Via EMS last night. Pt with constant jerks, parents thought he was seizing. Jerks have continued, eased up some. Father states E.R did not offer much infomration other than UTI. Has not started antibiotic yet, will pick up when pharmacy opens. Father would like to bring pt in. Open appointment at 2:15 tomorrow offered (and held) but father did not think that would be possible, was going to discuss with wife. Wanted to bring pt in first thing in the morning. Please advise.

## 2015-04-10 NOTE — Discharge Instructions (Signed)
Please read and follow all provided instructions.  Your diagnoses today include:  1. Tremor    Tests performed today include:  Blood counts and electrolytes - normal  Urine test - shows infection in urine  Vital signs. See below for your results today.   Medications prescribed:   Ciprofloxacin - antibiotic  You have been prescribed an antibiotic medicine: take the entire course of medicine even if you are feeling better. Stopping early can cause the antibiotic not to work.  Take any prescribed medications only as directed.  Home care instructions:  Follow any educational materials contained in this packet.  BE VERY CAREFUL not to take multiple medicines containing Tylenol (also called acetaminophen). Doing so can lead to an overdose which can damage your liver and cause liver failure and possibly death.   Follow-up instructions: Please follow-up with Dr. Everlena CooperJaffe as soon as possible.   Return instructions:   Please return to the Emergency Department if you experience worsening symptoms.  Return if you have weakness in your arms or legs, slurred speech, trouble walking or talking, confusion, or trouble with your balance.   Please return if you have any other emergent concerns.  Additional Information:  Your vital signs today were: BP 140/80 mmHg   Pulse 74   Temp(Src) 98.4 F (36.9 C) (Oral)   Resp 14   SpO2 99% If your blood pressure (BP) was elevated above 135/85 this visit, please have this repeated by your doctor within one month. --------------

## 2015-04-11 ENCOUNTER — Ambulatory Visit (INDEPENDENT_AMBULATORY_CARE_PROVIDER_SITE_OTHER): Payer: Medicaid Other | Admitting: Neurology

## 2015-04-11 ENCOUNTER — Encounter: Payer: Self-pay | Admitting: Neurology

## 2015-04-11 VITALS — BP 104/76 | HR 87 | Wt 133.0 lb

## 2015-04-11 DIAGNOSIS — G40209 Localization-related (focal) (partial) symptomatic epilepsy and epileptic syndromes with complex partial seizures, not intractable, without status epilepticus: Secondary | ICD-10-CM

## 2015-04-11 DIAGNOSIS — S069X5D Unspecified intracranial injury with loss of consciousness greater than 24 hours with return to pre-existing conscious level, subsequent encounter: Secondary | ICD-10-CM

## 2015-04-11 DIAGNOSIS — R251 Tremor, unspecified: Secondary | ICD-10-CM

## 2015-04-11 DIAGNOSIS — R4189 Other symptoms and signs involving cognitive functions and awareness: Secondary | ICD-10-CM | POA: Diagnosis not present

## 2015-04-11 DIAGNOSIS — Z720 Tobacco use: Secondary | ICD-10-CM

## 2015-04-11 NOTE — Progress Notes (Signed)
Chart forwarded.  

## 2015-04-11 NOTE — Progress Notes (Signed)
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  Records, labs and CT of head reviewed.  He is accompanied by his father who provides some history.  UPDATE: He was tapered off of Depakote and tremors had improved.  Last visit, he was started on Lamictal titration while continuing Keppra, however he developed a rash.  Lamictal was discontinued and he was started titration on oxcarbazepine on 03/25/15.  Once he was on 300mg  twice daily, Keppra was discontinued.  On 04/09/15, tremors suddenly recurred and he presented to the ED for evaluation.  It was described by his father as generalized jerking of his body.  He did not lose consciousness.  It lasted for several hours.  Na was 139.  UA was positive for nitrites, leukocytes, WBCs and bacteria.  He was afebrile.  Depakote level was undetectable.  He was started on Cipro for UTI.  It has since improved.  HISTORY: On 08/12/13, he had a seizure while in a convenience store.  He fell, hitting his head on the pavement.  He was brought to the ED at [facility].  CT of the head showed a large left subdural hematoma with subarachnoid blood over the left convexity with a 12 mm left to right shift.  He underwent emergent craniotomy.  He was in an induced coma for 4-5 weeks.  His hospital course was complicated by development of a DVT secondary to PICC, requiring heparin drip, VDRF status post extubation, and pneumonia.  He is still taking Xarelto.  He had a G-tube placed for nutrition.  He followed up with an neurologist, Dr. Caswell Corwin.  He was placed on Zoloft for agitation and outbursts.  He presented to the ED at [facility] on 12/06/13 for new shaking episodes with extreme agitation and combativeness.  It seemed to  have occurred after taking a dose of risperidone.  CT of the head from 12/06/13, showed post-surgical changes, with interval clearing of left extra-axial fluid collection, mild increase in ventricular size (left greater than right), hypodense areas involving the left cerebrum and no midline shift or hemorrhage.  He was found to have HCAP and sepsis and was treated with antibiotics.  He was started on Lexapro.  Keppra was switched to valproic acid.  He was just discharged from [facility] over the weekend for 1 week of confusion and generalized fatigue, staggering gait, tremulousness and decreased oral intake.  He had been experiencing constipation, which was treated with laxatives.  An LP was attempted, but unsuccessful.  Since he was afebrile without leukocytosis, further pursuit was not attempted.  He was found to have acute on chronic renal injury due to pre-renal azotemia and diuretics.  He was treated with IV fluids. Cr was stable at 1.57.  Depakote level was 98.  MRI of the brain showed no acute abnormalities.  Depakote ER was increased to 1000mg  twice daily.  He also continues to take Keppra 500mg  twice daily, although it was supposed to have been discontinued last year.  He still exhibits tremors and he has reduced appetite.  There has been no seizures that parents are aware of.    He resides at [facility].  He has regained much more strength in the right upper extremity.  His mood is stable.  Recently, his PEG  and trach was removed.  He has an unsteady gait but ambulates with other people around.  He has global aphasia.  He finished PT/OT and is currently still undergoing speech therapy.  He has had no further seizures.  He denies headache  He has a history of alcoholism.  Prior to the accident, he had two other seizures over a 5-6 year period.  It was attributed to his drinking.  He was never taking an antiepileptic medication.  He has a tenth grade education.  Prior to the  accident, he had his own apartment.  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  PAST MEDICAL HISTORY: Past Medical History  Diagnosis Date  . TBI (traumatic brain injury) (HCC)     07/2013, fall from standing post seizure  . Seizure (HCC)   . DVT (deep venous thrombosis) (HCC)     UE, LE DVT during hospitalization  . H/O renal calculi   . Cirrhosis (HCC)   . Depression   . GERD (gastroesophageal reflux disease)   . H/O ETOH abuse     MEDICATIONS: Current Outpatient Prescriptions on File Prior to Visit  Medication Sig Dispense Refill  . amantadine (SYMMETREL) 100 MG capsule Take 100 mg by mouth 2 (two) times daily.    . bromocriptine (PARLODEL) 2.5 MG tablet Take 5 mg by mouth 2 (two) times daily.    . ciprofloxacin (CIPRO) 500 MG tablet Take 1 tablet (500 mg total) by mouth 2 (two) times daily. 20 tablet 0  . clonazePAM (KLONOPIN) 0.5 MG tablet Take 1 tablet (0.5 mg total) by mouth every 12 (twelve) hours as needed for anxiety (anxiety).    Marland Kitchen. docusate sodium (COLACE) 100 MG capsule Take 100 mg by mouth 2 (two) times daily.    Marland Kitchen. escitalopram (LEXAPRO) 10 MG tablet Take 10 mg by mouth daily.    . furosemide (LASIX) 20 MG tablet Take 20 mg by mouth daily.    . Linaclotide (LINZESS) 145 MCG CAPS capsule Take 145 mcg by mouth daily.    . metoprolol tartrate (LOPRESSOR) 25 MG tablet Take 12.5 mg by mouth 2 (two) times daily.    . ondansetron (ZOFRAN) 4 MG tablet Take 1 tablet (4 mg total) by mouth every 6 (six) hours. (Patient taking differently: Take 4 mg by mouth every 8 (eight) hours as needed for nausea or vomiting. ) 12 tablet 0  . OXcarbazepine (TRILEPTAL) 150 MG tablet Take 1 tablet two times daily x 7 days, then take 2 tablets two times daily (Patient taking differently: Take 150 mg by mouth 2 (two) times daily. Take 1 tablet two times daily x 7 days, then take 2 tablets two times daily) 120 tablet 1  . pantoprazole (PROTONIX) 40 MG tablet Take 40 mg by mouth daily  as needed (for heartburn).     . polyethylene glycol (MIRALAX / GLYCOLAX) packet Take 17 g by mouth daily as needed for mild constipation.    Marland Kitchen. spironolactone (ALDACTONE) 50 MG tablet Take 50 mg by mouth daily.    . [DISCONTINUED] divalproex (DEPAKOTE ER) 500 MG 24 hr tablet Take 2 tablets (1,000 mg total) by mouth 2 (two) times daily.    . [DISCONTINUED] LamoTRIgine XR 200 MG TB24 Take 1 tablet (200 mg total) by mouth daily. 30 tablet 1  . [DISCONTINUED] QUEtiapine (SEROQUEL) 100 MG tablet Take 1-2 tablets (100-200 mg total) by mouth 3 (three) times daily. 1 tab in the morning and afternoon, 2 tabs at bedtime (Patient  taking differently: Take 100 mg by mouth daily as needed (for agitation). ) 90 tablet 0   No current facility-administered medications on file prior to visit.    ALLERGIES: Allergies  Allergen Reactions  . Ativan [Lorazepam] Other (See Comments)    Pt has paradoxical effect with sedating medications.   . Lactulose Hives    FAMILY HISTORY: Family History  Problem Relation Age of Onset  . Congestive Heart Failure Brother     Deceased age 23  . Hypercholesterolemia Mother   . Hypertension Mother     SOCIAL HISTORY: Social History   Social History  . Marital Status: Single    Spouse Name: N/A  . Number of Children: N/A  . Years of Education: N/A   Occupational History  . Not on file.   Social History Main Topics  . Smoking status: Current Some Day Smoker -- 0.25 packs/day for 20 years  . Smokeless tobacco: Never Used  . Alcohol Use: No  . Drug Use: No  . Sexual Activity: Not on file   Other Topics Concern  . Not on file   Social History Narrative    REVIEW OF SYSTEMS: Constitutional: No fevers, chills, or sweats, no generalized fatigue, change in appetite Eyes: No visual changes, double vision, eye pain Ear, nose and throat: No hearing loss, ear pain, nasal congestion, sore throat Cardiovascular: No chest pain, palpitations Respiratory:  No  shortness of breath at rest or with exertion, wheezes GastrointestinaI: No nausea, vomiting, diarrhea, abdominal pain, fecal incontinence Genitourinary:  No dysuria, urinary retention or frequency Musculoskeletal:  No neck pain, back pain Integumentary: No rash, pruritus, skin lesions Neurological: as above Psychiatric: No depression, insomnia, anxiety Endocrine: No palpitations, fatigue, diaphoresis, mood swings, change in appetite, change in weight, increased thirst Hematologic/Lymphatic:  No anemia, purpura, petechiae. Allergic/Immunologic: no itchy/runny eyes, nasal congestion, recent allergic reactions, rashes  PHYSICAL EXAM: Filed Vitals:   04/11/15 1428  BP: 104/76  Pulse: 87   General: No acute distress.  Patient appears well-groomed. Head:  Normocephalic/atraumatic Eyes:  Fundoscopic exam unremarkable without vessel changes, exudates, hemorrhages or papilledema. Neck: supple, no paraspinal tenderness, full range of motion Heart:  Regular rate and rhythm Lungs:  Clear to auscultation bilaterally Back: No paraspinal tenderness Neurological Exam: alert, unable to assess orientation, recent and remote memory, fund of knowledge, attention and concentration. Speech fluent and not dysarthric, but unintelligible.  Expressive more than receptive aphasia.  Unable to name or repeat.  Follows some simple commands but not others.  Unable to write.  Right pupil 0.21mm larger than left pupil but both round and reactive to light.  Unable to assess CN V.  Blinks to threat bilaterally.  Otherwise, CN II-XII intact.  mild increased tone in right upper extremity, no fasciculations.  Motor 5-/5 on the right upper and lower extremities.  5/5 left side.  Unable to assess sensation.  DTR 3+ right upper and lower extremities.  2+ on left.  Trace postural and kinetic tremor of upper extremities.  Wide-based gait.  IMPRESSION: Traumatic brain injury Traumatic SDH status post craniotomy Seizure  disorder Tremor Tobacco abuse  I don't think this jerking spell was a seizure, particularly since it was generalized for long period of time and he maintained consciousness.  I think it is likely behavioral or related to UTI  PLAN: 1.  Continue oxcarbazepine  twice daily 2.  May use clonazepam 0.5mg  if he has a severe episode of tremors 3.  Recheck BMP prior to follow up next  month. 4.  Smoking cessation   Shon Millet, DO  CC:  Darin Engels, MD

## 2015-04-11 NOTE — Patient Instructions (Signed)
I don't think the tremors were seizures.  It was likely related to the UTI and/or behavioral 1.  If he should have episode of tremors, then give him clonazepam 0.5mg  2.  Continue oxcarbazepine 300mg  twice daily. 3.  Follow up next month.  I want to check labs (BMP) prior to follow up.

## 2015-04-12 LAB — URINE CULTURE: Culture: >=100000

## 2015-04-14 ENCOUNTER — Telehealth (HOSPITAL_BASED_OUTPATIENT_CLINIC_OR_DEPARTMENT_OTHER): Payer: Self-pay | Admitting: Emergency Medicine

## 2015-04-14 ENCOUNTER — Other Ambulatory Visit: Payer: Self-pay | Admitting: Neurology

## 2015-04-14 NOTE — Telephone Encounter (Signed)
Post ED Visit - Positive Culture Follow-up  Culture report reviewed by antimicrobial stewardship pharmacist:  []  Hector [name], Pharm.D. [x]  Hector [name], Pharm.D., BCPS []  Hector [name], Pharm.D. []  Hector [name], Pharm.D., BCPS []  Hector [name], [address] BCPS, AAHIVP []  Hector [name], Pharm.D., BCPS, AAHIVP []  Hector [name], Pharm.D. []  Hector [name], [address]  Positive urine culture Treated with ciprofloxacin, organism sensitive to the same and no further patient follow-up is required at this time.  Hector [name], Hector [name] 04/14/2015, 1:10 PM

## 2015-04-29 ENCOUNTER — Telehealth: Payer: Self-pay

## 2015-04-29 NOTE — Telephone Encounter (Signed)
Patient is due for some lab work. Pt needs Lamictal level and BMP. Mom aware. Orders placed. Will come by sometime this week to have drawn.

## 2015-04-30 ENCOUNTER — Other Ambulatory Visit (INDEPENDENT_AMBULATORY_CARE_PROVIDER_SITE_OTHER): Payer: Medicaid Other

## 2015-04-30 DIAGNOSIS — Z79899 Other long term (current) drug therapy: Secondary | ICD-10-CM

## 2015-04-30 DIAGNOSIS — R251 Tremor, unspecified: Secondary | ICD-10-CM

## 2015-04-30 LAB — BASIC METABOLIC PANEL
BUN: 20 mg/dL (ref 6–23)
CALCIUM: 10.3 mg/dL (ref 8.4–10.5)
CO2: 31 mEq/L (ref 19–32)
CREATININE: 1.36 mg/dL (ref 0.40–1.50)
Chloride: 102 mEq/L (ref 96–112)
GFR: 59.84 mL/min — AB (ref >60.00)
GLUCOSE: 122 mg/dL — AB (ref 70–99)
Potassium: 3.9 mEq/L (ref 3.5–5.1)
SODIUM: 141 meq/L (ref 135–145)

## 2015-05-02 LAB — LAMOTRIGINE LEVEL

## 2015-05-09 ENCOUNTER — Ambulatory Visit (INDEPENDENT_AMBULATORY_CARE_PROVIDER_SITE_OTHER): Payer: Medicaid Other | Admitting: Neurology

## 2015-05-09 ENCOUNTER — Encounter: Payer: Self-pay | Admitting: Neurology

## 2015-05-09 VITALS — BP 88/50 | HR 111 | Wt 134.7 lb

## 2015-05-09 DIAGNOSIS — G253 Myoclonus: Secondary | ICD-10-CM

## 2015-05-09 DIAGNOSIS — S069X5D Unspecified intracranial injury with loss of consciousness greater than 24 hours with return to pre-existing conscious level, subsequent encounter: Secondary | ICD-10-CM | POA: Diagnosis not present

## 2015-05-09 DIAGNOSIS — F17200 Nicotine dependence, unspecified, uncomplicated: Secondary | ICD-10-CM

## 2015-05-09 DIAGNOSIS — G40209 Localization-related (focal) (partial) symptomatic epilepsy and epileptic syndromes with complex partial seizures, not intractable, without status epilepticus: Secondary | ICD-10-CM | POA: Diagnosis not present

## 2015-05-09 NOTE — Patient Instructions (Signed)
Continue oxcarbazepine 300mg  twice daily If shakes get too severe, he may take a Klonopin Follow up in 6 months

## 2015-05-09 NOTE — Progress Notes (Signed)
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  Records, labs and CT of head reviewed.  He is accompanied by his father who provides some history.  Labs reviewed.  UPDATE: He is taking oxcarbazepine 300mg  twice daily.  He uses clonazepam 0.5mg  as needed for episodes of tremor.  BMP from 04/30/15 was unremarkable.  He still has an occasional jerk but nothing severe.  He is doing well.  HISTORY: On 08/12/13, he had a seizure while in a convenience store.  He fell, hitting his head on the pavement.  He was brought to the ED at [facility].  CT of the head showed a large left subdural hematoma with subarachnoid blood over the left convexity with a 12 mm left to right shift.  He underwent emergent craniotomy.  He was in an induced coma for 4-5 weeks.  His hospital course was complicated by development of a DVT secondary to PICC, requiring heparin drip, VDRF status post extubation, and pneumonia.  He is still taking Xarelto.  He had a G-tube placed for nutrition.  He followed up with an neurologist, Dr. Caswell Corwin.  He was placed on Zoloft for agitation and outbursts.  He presented to the ED at [facility] on 12/06/13 for new shaking episodes with extreme agitation and combativeness.  It seemed to have occurred after taking a dose of risperidone.  CT of the head from 12/06/13, showed post-surgical changes, with interval clearing of left extra-axial fluid collection, mild increase in ventricular size (left greater than right), hypodense areas involving the left cerebrum and no midline shift or hemorrhage.  He was found to have HCAP and sepsis and was treated with antibiotics.    Depakote was discontinued due to tremor.  Keppra was discontinued due to irritability.   Lamictal was discontinued due to rash.  He resides at [facility].  He has regained much more strength in the right upper extremity.  His mood is stable.  Recently, his PEG and trach was removed.  He has an unsteady gait but ambulates with other people around.  He has global aphasia.  He finished PT/OT and is currently still undergoing speech therapy.  He has had no further seizures.  He denies headache  He has a history of alcoholism.  Prior to the accident, he had two other seizures over a 5-6 year period.  It was attributed to his drinking.  He was never taking an antiepileptic medication.  He has a tenth grade education.  Prior to the accident, he had his own apartment.  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  PAST MEDICAL HISTORY: Past Medical History  Diagnosis Date  . TBI (traumatic brain injury) (HCC)     07/2013, fall from standing post seizure  . Seizure (HCC)   . DVT (deep venous thrombosis) (HCC)     UE, LE DVT during hospitalization  . H/O renal calculi   . Cirrhosis (HCC)   . Depression   . GERD (gastroesophageal reflux disease)   . H/O ETOH abuse     MEDICATIONS: Current Outpatient Prescriptions on File Prior to Visit  Medication Sig Dispense Refill  . amantadine (SYMMETREL) 100 MG capsule Take 100 mg by mouth 2 (two) times daily.    . bromocriptine (PARLODEL) 2.5  MG tablet Take 5 mg by mouth 2 (two) times daily.    . clonazePAM (KLONOPIN) 0.5 MG tablet Take 1 tablet (0.5 mg total) by mouth every 12 (twelve) hours as needed for anxiety (anxiety).    Marland Kitchen docusate sodium (COLACE) 100 MG capsule Take 100 mg by mouth 2 (two) times daily.    Marland Kitchen escitalopram (LEXAPRO) 10 MG tablet Take 10 mg by mouth daily.    . furosemide (LASIX) 20 MG tablet Take 20 mg by mouth daily.    . metoprolol tartrate (LOPRESSOR) 25 MG tablet Take 12.5 mg by mouth 2 (two) times daily.    . ondansetron (ZOFRAN) 4 MG tablet Take 1 tablet (4 mg total) by mouth  every 6 (six) hours. (Patient taking differently: Take 4 mg by mouth every 8 (eight) hours as needed for nausea or vomiting. ) 12 tablet 0  . OXcarbazepine (TRILEPTAL) 150 MG tablet Take 1 tablet (150 mg total) by mouth 2 (two) times daily. 120 tablet 2  . pantoprazole (PROTONIX) 40 MG tablet Take 40 mg by mouth daily as needed (for heartburn).     . polyethylene glycol (MIRALAX / GLYCOLAX) packet Take 17 g by mouth daily as needed for mild constipation.    Marland Kitchen spironolactone (ALDACTONE) 50 MG tablet Take 50 mg by mouth daily.    . [DISCONTINUED] divalproex (DEPAKOTE ER) 500 MG 24 hr tablet Take 2 tablets (1,000 mg total) by mouth 2 (two) times daily.    . [DISCONTINUED] LamoTRIgine XR 200 MG TB24 Take 1 tablet (200 mg total) by mouth daily. 30 tablet 1   No current facility-administered medications on file prior to visit.    ALLERGIES: Allergies  Allergen Reactions  . Ativan [Lorazepam] Other (See Comments)    Pt has paradoxical effect with sedating medications.   . Lactulose Hives    FAMILY HISTORY: Family History  Problem Relation Age of Onset  . Congestive Heart Failure Brother     Deceased age 41  . Hypercholesterolemia Mother   . Hypertension Mother     SOCIAL HISTORY: Social History   Social History  . Marital Status: Single    Spouse Name: N/A  . Number of Children: N/A  . Years of Education: N/A   Occupational History  . Not on file.   Social History Main Topics  . Smoking status: Current Some Day Smoker -- 0.25 packs/day for 20 years  . Smokeless tobacco: Never Used  . Alcohol Use: No  . Drug Use: No  . Sexual Activity: Not on file   Other Topics Concern  . Not on file   Social History Narrative    REVIEW OF SYSTEMS: Constitutional: No fevers, chills, or sweats, no generalized fatigue, change in appetite Eyes: No visual changes, double vision, eye pain Ear, nose and throat: No hearing loss, ear pain, nasal congestion, sore throat Cardiovascular: No  chest pain, palpitations Respiratory:  No shortness of breath at rest or with exertion, wheezes GastrointestinaI: No nausea, vomiting, diarrhea, abdominal pain, fecal incontinence Genitourinary:  No dysuria, urinary retention or frequency Musculoskeletal:  No neck pain, back pain Integumentary: No rash, pruritus, skin lesions Neurological: as above Psychiatric: No depression, insomnia, anxiety Endocrine: No palpitations, fatigue, diaphoresis, mood swings, change in appetite, change in weight, increased thirst Hematologic/Lymphatic:  No anemia, purpura, petechiae. Allergic/Immunologic: no itchy/runny eyes, nasal congestion, recent allergic reactions, rashes  PHYSICAL EXAM: Filed Vitals:   05/09/15 0954  BP: 88/50  Pulse: 111   General: No acute distress.  Head:  Normocephalic/atraumatic Eyes:  Fundoscopic exam unremarkable without vessel changes, exudates, hemorrhages or papilledema. Neck: supple, no paraspinal tenderness, full range of motion Heart:  Regular rate and rhythm Lungs:  Clear to auscultation bilaterally Back: No paraspinal tenderness Neurological Exam: alert, unable to assess orientation, recent and remote memory, fund of knowledge, attention and concentration. Speech fluent and not dysarthric, but unintelligible.  Expressive more than receptive aphasia.  Unable to name or repeat.  Follows some simple commands but not others.  Unable to write.  Right pupil 0.225mm larger than left pupil but both round and reactive to light.  Unable to assess CN V.  Blinks to threat bilaterally.  Otherwise, CN II-XII intact.  mild increased tone in right upper extremity, no fasciculations.  Motor 5-/5 on the right upper and lower extremities.  5/5 left side.  Unable to assess sensation.  DTR 3+ right upper and lower extremities.  2+ on left.  Trace postural and kinetic tremor of upper extremities.  Wide-based gait.  IMPRESSION: Traumatic brain injury Traumatic SDH status post craniotomy Seizure  disorder Myoclonus Tobacco use  PLAN: Oxcarbazepine 300mg  twice dailyl Klonopin as needed for severe twitching/tremor Smoking cessation Follow up in 6 months.  15 minutes spent face to face with patient, over 50% spent discussing management.  Shon MilletAdam Dreyton Roessner, DO  CC: Darin EngelsMelvin Morgan, MD

## 2015-05-09 NOTE — Progress Notes (Signed)
Chart forwarded.  

## 2015-06-01 ENCOUNTER — Emergency Department (HOSPITAL_COMMUNITY): Payer: Medicaid Other

## 2015-06-01 ENCOUNTER — Observation Stay (HOSPITAL_COMMUNITY)
Admission: EM | Admit: 2015-06-01 | Discharge: 2015-06-02 | Disposition: A | Discharge status: Home / Self Care | Payer: Medicaid Other | Attending: Internal Medicine | Admitting: Internal Medicine

## 2015-06-01 ENCOUNTER — Encounter (HOSPITAL_COMMUNITY): Payer: Self-pay | Admitting: Emergency Medicine

## 2015-06-01 DIAGNOSIS — Z87820 Personal history of traumatic brain injury: Secondary | ICD-10-CM | POA: Insufficient documentation

## 2015-06-01 DIAGNOSIS — Z86718 Personal history of other venous thrombosis and embolism: Secondary | ICD-10-CM | POA: Insufficient documentation

## 2015-06-01 DIAGNOSIS — K219 Gastro-esophageal reflux disease without esophagitis: Secondary | ICD-10-CM | POA: Diagnosis not present

## 2015-06-01 DIAGNOSIS — Z79899 Other long term (current) drug therapy: Secondary | ICD-10-CM | POA: Diagnosis not present

## 2015-06-01 DIAGNOSIS — R569 Unspecified convulsions: Secondary | ICD-10-CM

## 2015-06-01 DIAGNOSIS — F17210 Nicotine dependence, cigarettes, uncomplicated: Secondary | ICD-10-CM | POA: Diagnosis not present

## 2015-06-01 DIAGNOSIS — K7460 Unspecified cirrhosis of liver: Secondary | ICD-10-CM | POA: Diagnosis not present

## 2015-06-01 DIAGNOSIS — I10 Essential (primary) hypertension: Secondary | ICD-10-CM | POA: Diagnosis not present

## 2015-06-01 DIAGNOSIS — F1010 Alcohol abuse, uncomplicated: Secondary | ICD-10-CM | POA: Diagnosis not present

## 2015-06-01 DIAGNOSIS — F329 Major depressive disorder, single episode, unspecified: Secondary | ICD-10-CM | POA: Insufficient documentation

## 2015-06-01 DIAGNOSIS — G40909 Epilepsy, unspecified, not intractable, without status epilepticus: Principal | ICD-10-CM | POA: Insufficient documentation

## 2015-06-01 LAB — CARBAMAZEPINE LEVEL, TOTAL: Carbamazepine Lvl: <2 ug/mL — ABNORMAL LOW (ref 4.0–12.0)

## 2015-06-01 LAB — URINALYSIS, ROUTINE W REFLEX MICROSCOPIC
BILIRUBIN URINE: NEGATIVE
Glucose, UA: NEGATIVE mg/dL
Hgb urine dipstick: NEGATIVE
Ketones, ur: NEGATIVE mg/dL
Leukocytes, UA: NEGATIVE
NITRITE: NEGATIVE
Protein, ur: NEGATIVE mg/dL
SPECIFIC GRAVITY, URINE: 1.015 (ref 1.005–1.030)
pH: 6 (ref 5.0–8.0)

## 2015-06-01 LAB — CBC WITH DIFFERENTIAL/PLATELET
BASOS PCT: 0 %
Basophils Absolute: 0 10*3/uL (ref 0.0–0.1)
EOS ABS: 0.2 10*3/uL (ref 0.0–0.7)
EOS PCT: 2 %
HCT: 40.8 % (ref 39.0–52.0)
HEMOGLOBIN: 13.9 g/dL (ref 13.0–17.0)
Lymphocytes Relative: 16 %
Lymphs Abs: 1.5 10*3/uL (ref 0.7–4.0)
MCH: 31.9 pg (ref 26.0–34.0)
MCHC: 34.1 g/dL (ref 30.0–36.0)
MCV: 93.6 fL (ref 78.0–100.0)
MONO ABS: 0.9 10*3/uL (ref 0.1–1.0)
MONOS PCT: 9 %
NEUTROS PCT: 73 %
Neutro Abs: 7.2 10*3/uL (ref 1.7–7.7)
PLATELETS: 122 10*3/uL — AB (ref 150–400)
RBC: 4.36 MIL/uL (ref 4.22–5.81)
RDW: 12.2 % (ref 11.5–15.5)
WBC: 9.8 10*3/uL (ref 4.0–10.5)

## 2015-06-01 LAB — BASIC METABOLIC PANEL
ANION GAP: 11 (ref 5–15)
BUN: 19 mg/dL (ref 6–20)
CHLORIDE: 102 mmol/L (ref 101–111)
CO2: 27 mmol/L (ref 22–32)
CREATININE: 1.35 mg/dL — AB (ref 0.61–1.24)
Calcium: 9.4 mg/dL (ref 8.9–10.3)
GFR calc non Af Amer: >60 mL/min (ref >60)
Glucose, Bld: 122 mg/dL — ABNORMAL HIGH (ref 65–99)
POTASSIUM: 3.4 mmol/L — AB (ref 3.5–5.1)
SODIUM: 140 mmol/L (ref 135–145)

## 2015-06-01 LAB — CBG MONITORING, ED: GLUCOSE-CAPILLARY: 133 mg/dL — AB (ref 65–99)

## 2015-06-01 MED ORDER — SODIUM CHLORIDE 0.9 % IV SOLN
75.0000 mL/h | INTRAVENOUS | Status: DC
  Administered 2015-06-02: 75 mL/h via INTRAVENOUS

## 2015-06-01 MED ORDER — OXYCODONE-ACETAMINOPHEN 5-325 MG PO TABS: 1.0000 | ORAL_TABLET | ORAL | Status: DC | PRN

## 2015-06-01 MED ORDER — OXCARBAZEPINE 150 MG PO TABS
150.0000 mg | ORAL_TABLET | Freq: Two times a day (BID) | ORAL | Status: DC
  Administered 2015-06-02: 150 mg via ORAL
  Filled 2015-06-01 (×3): qty 1

## 2015-06-01 MED ORDER — POLYETHYLENE GLYCOL 3350 17 G PO PACK: 17.0000 g | PACK | Freq: Every day | ORAL | Status: DC | PRN

## 2015-06-01 MED ORDER — LORAZEPAM 2 MG/ML IJ SOLN: 1.0000 mg | Freq: Once | INTRAMUSCULAR | Status: DC

## 2015-06-01 MED ORDER — CLONAZEPAM 0.5 MG PO TABS: 0.5000 mg | ORAL_TABLET | Freq: Two times a day (BID) | ORAL | Status: DC | PRN

## 2015-06-01 MED ORDER — QUETIAPINE FUMARATE 50 MG PO TABS
100.0000 mg | ORAL_TABLET | Freq: Every day | ORAL | Status: DC
  Administered 2015-06-02: 100 mg via ORAL
  Filled 2015-06-01: qty 4

## 2015-06-01 MED ORDER — SODIUM CHLORIDE 0.9 % IV SOLN
INTRAVENOUS | Status: AC
  Administered 2015-06-01: via INTRAVENOUS

## 2015-06-01 MED ORDER — PANTOPRAZOLE SODIUM 40 MG PO TBEC: 40.0000 mg | DELAYED_RELEASE_TABLET | Freq: Every day | ORAL | Status: DC | PRN

## 2015-06-01 MED ORDER — ONDANSETRON HCL 4 MG PO TABS: 4.0000 mg | ORAL_TABLET | Freq: Three times a day (TID) | ORAL | Status: DC | PRN

## 2015-06-01 MED ORDER — SODIUM CHLORIDE 0.9 % IV BOLUS (SEPSIS)
1000.0000 mL | Freq: Once | INTRAVENOUS | Status: AC
  Administered 2015-06-01: 1000 mL via INTRAVENOUS

## 2015-06-01 MED ORDER — METOPROLOL TARTRATE 12.5 MG HALF TABLET: 12.5000 mg | ORAL_TABLET | Freq: Two times a day (BID) | ORAL | Status: DC

## 2015-06-01 MED ORDER — BROMOCRIPTINE MESYLATE 2.5 MG PO TABS
5.0000 mg | ORAL_TABLET | Freq: Two times a day (BID) | ORAL | Status: DC
  Administered 2015-06-02: 5 mg via ORAL
  Filled 2015-06-01 (×4): qty 2

## 2015-06-01 MED ORDER — SPIRONOLACTONE 50 MG PO TABS
50.0000 mg | ORAL_TABLET | Freq: Every day | ORAL | Status: DC
  Administered 2015-06-02: 50 mg via ORAL
  Filled 2015-06-01: qty 1
  Filled 2015-06-01: qty 2

## 2015-06-01 MED ORDER — DOCUSATE SODIUM 100 MG PO CAPS
100.0000 mg | ORAL_CAPSULE | Freq: Two times a day (BID) | ORAL | Status: DC
  Administered 2015-06-02: 100 mg via ORAL
  Filled 2015-06-01 (×2): qty 1

## 2015-06-01 MED ORDER — ESCITALOPRAM OXALATE 10 MG PO TABS
10.0000 mg | ORAL_TABLET | Freq: Every day | ORAL | Status: DC
  Administered 2015-06-02: 10 mg via ORAL
  Filled 2015-06-01: qty 1

## 2015-06-01 MED ORDER — LINACLOTIDE 145 MCG PO CAPS
145.0000 ug | ORAL_CAPSULE | Freq: Every day | ORAL | Status: DC
  Administered 2015-06-02: 145 ug via ORAL
  Filled 2015-06-01: qty 1

## 2015-06-01 MED ORDER — ENOXAPARIN SODIUM 40 MG/0.4ML ~~LOC~~ SOLN
40.0000 mg | SUBCUTANEOUS | Status: DC
  Administered 2015-06-02: 40 mg via SUBCUTANEOUS
  Filled 2015-06-01 (×2): qty 0.4

## 2015-06-01 MED ORDER — FUROSEMIDE 20 MG PO TABS
20.0000 mg | ORAL_TABLET | Freq: Every day | ORAL | Status: DC
  Administered 2015-06-02: 20 mg via ORAL
  Filled 2015-06-01: qty 1

## 2015-06-01 MED ORDER — LACOSAMIDE 50 MG PO TABS
50.0000 mg | ORAL_TABLET | Freq: Two times a day (BID) | ORAL | Status: DC
  Administered 2015-06-02: 50 mg via ORAL
  Filled 2015-06-01: qty 1

## 2015-06-01 MED ORDER — AMANTADINE HCL 100 MG PO CAPS
100.0000 mg | ORAL_CAPSULE | Freq: Two times a day (BID) | ORAL | Status: DC
  Administered 2015-06-02: 100 mg via ORAL
  Filled 2015-06-01 (×3): qty 1

## 2015-06-01 MED ORDER — LEVETIRACETAM 500 MG PO TABS
1000.0000 mg | ORAL_TABLET | Freq: Two times a day (BID) | ORAL | Status: DC
  Administered 2015-06-02 (×2): 1000 mg via ORAL
  Filled 2015-06-01 (×4): qty 2

## 2015-06-01 MED ORDER — SODIUM CHLORIDE 0.9 % IV SOLN
100.0000 mg | Freq: Two times a day (BID) | INTRAVENOUS | Status: DC
  Administered 2015-06-01: 100 mg via INTRAVENOUS
  Filled 2015-06-01 (×2): qty 10

## 2015-06-01 NOTE — ED Notes (Signed)
Pt arrives from home via [facility].  EMS reports pt's family states pt had what looked like focal seizure followed by clonic tonic seizure.  EMS reports family reports seizure lasted about 3-4 minutes with minimal post-ictal symptoms.  EMS reports pt has expressive aphasia from TBI sustained during previous seizure.  Per EMS pt at baseline.  PT AOx4.

## 2015-06-01 NOTE — ED Notes (Signed)
Pt given urinal and asked to provide urine sample.  

## 2015-06-01 NOTE — H&P (Signed)
History and Physical  Hector [name] VQQ:[id number] DOB: November 22, 1970 DOA: 06/01/2015  PCP: Ulyess Blossom, MD   Chief Complaint: Seizure  History of Present Illness:  Patient is a 45 yo male with history of traumatic brain injury, seizure disorder, expressive aphasia who came here with cc of a seizure that he had around 6 pm tonight with LOC but w/o any injury or bowel/urine incontinence. He regained baseline mental status fairly quickly per dad. The patient can't express himself but he denies chest pain/fever/dyspena/N/V/D/C.  Review of Systems:  CONSTITUTIONAL:  .  No fever or chills. Eyes:  No visual changes.   ENT:    No epistaxis.   RESPIRATORY:  No cough.  No wheeze.  CARDIOVASCULAR:  No chest pains.  No palpitations. GASTROINTESTINAL:  No abdominal pain.   GENITOURINARY:  No urgency.  No frequency.  No dysuria MUSCULOSKELETAL:  No musculoskeletal pain.  NEUROLOGICAL: seizure. PSYCHIATRIC:  No depression. No anxiety. No suicidal ideation. SKIN:  No rashes.  No lesions.  No wounds. ENDOCRINE:  No unexplained weight loss.  No polydipsia.  No polyuria.  No polyphagia. HEMATOLOGIC:  No anemia.  No purpura.  No petechiae.  No bleeding.  ALLERGIC AND IMMUNOLOGIC:  No pruritus.  No swelling Other:  Past Medical and Surgical History:   Past Medical History  Diagnosis Date  . TBI (traumatic brain injury) (HCC)     07/2013, fall from standing post seizure  . Seizure (HCC)   . DVT (deep venous thrombosis) (HCC)     UE, LE DVT during hospitalization  . H/O renal calculi   . Cirrhosis (HCC)   . Depression   . GERD (gastroesophageal reflux disease)   . H/O ETOH abuse    Past Surgical History  Procedure Laterality Date  . Tracheostomy      feinstein  . Gastrostomy tube placement    . Removal of gastrostomy tube    . Brain surgery      Social History:   reports that he has been smoking.  He has never used smokeless tobacco. He reports that he drinks alcohol.  He reports that he does not use illicit drugs.   Allergies  Allergen Reactions  . Ativan [Lorazepam] Other (See Comments)    Pt has paradoxical effect with sedating medications.   . Lactulose Hives    Family History  Problem Relation Age of Onset  . Congestive Heart Failure Brother     Deceased age 50  . Hypercholesterolemia Mother   . Hypertension Mother       Prior to Admission medications   Medication Sig Start Date End Date Taking? Authorizing Provider  amantadine (SYMMETREL) 100 MG capsule Take 100 mg by mouth 2 (two) times daily.   Yes Historical Provider, MD  bromocriptine (PARLODEL) 2.5 MG tablet Take 5 mg by mouth 2 (two) times daily.   Yes Historical Provider, MD  clonazePAM (KLONOPIN) 0.5 MG tablet Take 1 tablet (0.5 mg total) by mouth every 12 (twelve) hours as needed for anxiety (anxiety). 02/15/15  Yes Vassie Loll, MD  docusate sodium (COLACE) 100 MG capsule Take 100 mg by mouth 2 (two) times daily.   Yes Historical Provider, MD  escitalopram (LEXAPRO) 10 MG tablet Take 10 mg by mouth daily.   Yes Historical Provider, MD  furosemide (LASIX) 20 MG tablet Take 20 mg by mouth daily.   Yes Historical Provider, MD  levETIRAcetam (KEPPRA) 1000 MG tablet Take 1 tablet by mouth 2 (two) times daily. 11/21/14 11/21/15 Yes  Historical Provider, MD  Linaclotide Karlene Einstein) 145 MCG CAPS capsule Take 145 mcg by mouth daily.   Yes Historical Provider, MD  metoprolol tartrate (LOPRESSOR) 25 MG tablet Take 12.5 mg by mouth 2 (two) times daily.   Yes Historical Provider, MD  ondansetron (ZOFRAN) 4 MG tablet Take 1 tablet (4 mg total) by mouth every 6 (six) hours. Patient taking differently: Take 4 mg by mouth every 8 (eight) hours as needed for nausea or vomiting.  12/10/13  Yes Junious Silk, PA-C  OXcarbazepine (TRILEPTAL) 150 MG tablet Take 1 tablet (150 mg total) by mouth 2 (two) times daily. 04/15/15  Yes Adam Mliss Fritz, DO  pantoprazole (PROTONIX) 40 MG tablet Take 40 mg by mouth daily  as needed (for heartburn).    Yes Historical Provider, MD  polyethylene glycol (MIRALAX / GLYCOLAX) packet Take 17 g by mouth daily as needed for mild constipation.   Yes Historical Provider, MD  QUEtiapine (SEROQUEL) 100 MG tablet Take 100 mg by mouth at bedtime.   Yes Historical Provider, MD  spironolactone (ALDACTONE) 50 MG tablet Take 50 mg by mouth daily.   Yes Historical Provider, MD  oxyCODONE-acetaminophen (PERCOCET/ROXICET) 5-325 MG tablet Take 1 tablet by mouth every 4 (four) hours as needed for severe pain. Reported on 06/01/2015    Historical Provider, MD    Physical Exam: BP 100/75 mmHg  Pulse 80  Temp(Src) 98.7 F (37.1 C) (Oral)  Resp 23  SpO2 94%  GENERAL : Well developed, well nourished, alert and cooperative, and appears to be in no acute distress. HEAD: normocephalic. EYES: PERRL, EOMI. NOSE: No nasal discharge. NECK: Neck supple CARDIAC: Normal S1 and S2. No S3, S4 or murmurs. Rhythm is regular. There is no peripheral edema. LUNGS: Clear to auscultation  ABDOMEN: Positive bowel sounds. Soft, nondistended, nontender. No guarding or rebound. No masses. NEUROLOGICAL: The mental examination revealed expressive aphasia. No new focal deficits per patient exam. Good strength throughout.  SKIN: Skin normal color           Labs on Admission:  Reviewed.   Radiological Exams on Admission: Dg Chest 2 View  06/01/2015  CLINICAL DATA:  Focal seizure follow by colonic tonic seizure lasting 3-4 minutes. History traumatic brain injury, seizure, cirrhosis and alcohol abuse. EXAM: CHEST  2 VIEW COMPARISON:  Chest radiograph February 13, 2015 FINDINGS: Cardiomediastinal silhouette is normal. Mild apical pleural thickening. Lung base scarring, similar to prior examination without pleural effusion or focal consolidation. No pneumothorax. Moderate mid thoracic compression fracture was present previously. IMPRESSION: LEFT lung base scarring without acute cardiopulmonary process.  Electronically Signed   By: Awilda Metro M.D.   On: 06/01/2015 21:40     Assessment/Plan  Seizure: Breakthrough likely due to sub therapeutic levels Patient complaint with medication Loaded with Keppra and Vimpat Continue Vimpat 50 mg BID and all other home meds per neurology consult ( per ER physician) Monitor for 24 hours  CKD: cr at baseline.    DVT prophylaxis: Cascade enoxaparin Consultants: Neuro Code Status: Full     Eston Esters M.D [facility]
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
## 2015-06-02 ENCOUNTER — Telehealth: Payer: Self-pay

## 2015-06-02 DIAGNOSIS — R569 Unspecified convulsions: Secondary | ICD-10-CM

## 2015-06-02 MED ORDER — OXCARBAZEPINE 150 MG PO TABS
150.0000 mg | ORAL_TABLET | Freq: Two times a day (BID) | ORAL | Status: DC
  Filled 2015-06-02: qty 1

## 2015-06-02 MED ORDER — OXCARBAZEPINE 300 MG PO TABS
300.0000 mg | ORAL_TABLET | Freq: Two times a day (BID) | ORAL | Status: DC
  Administered 2015-06-02: 300 mg via ORAL
  Filled 2015-06-02 (×2): qty 1

## 2015-06-02 MED ORDER — OXCARBAZEPINE 300 MG PO TABS: 300.0000 mg | ORAL_TABLET | Freq: Two times a day (BID) | ORAL | Status: DC

## 2015-06-02 MED ORDER — OXCARBAZEPINE 300 MG PO TABS
600.0000 mg | ORAL_TABLET | Freq: Once | ORAL | Status: DC
  Filled 2015-06-02: qty 2

## 2015-06-02 NOTE — Progress Notes (Signed)
Patient arrived to unit from ED at this time. Pt noted with expressive aphasia with simple nods/gestures. Safety precautions and orders reviewed at this time. TELE applied and confirmed. Sz pads applied to bedside rails. No other distress noted. Will continue to monitor.  Sim Boast, RN

## 2015-06-02 NOTE — Discharge Summary (Signed)
Hector [name], is a 45 y.o. male  DOB 1970-06-09  MRN [id number].  Admission date:  06/01/2015  Admitting Physician  Eston Esters, MD  Discharge Date:  06/02/2015   Primary MD  Ulyess Blossom, MD  Recommendations for primary care physician for things to follow:   Monitor seizure medication dosages and levels closely. Needs outpatient neurology follow-up within a week.   Admission Diagnosis  Seizures (HCC) [R56.9]   Discharge Diagnosis  Seizures (HCC) [R56.9]     Active Problems:   Seizure [facility])      Past Medical History  Diagnosis Date  . TBI (traumatic brain injury) (HCC)     07/2013, fall from standing post seizure  . Seizure (HCC)   . DVT (deep venous thrombosis) (HCC)     UE, LE DVT during hospitalization  . H/O renal calculi   . Cirrhosis (HCC)   . Depression   . GERD (gastroesophageal reflux disease)   . H/O ETOH abuse     Past Surgical History  Procedure Laterality Date  . Tracheostomy      feinstein  . Gastrostomy tube placement    . Removal of gastrostomy tube    . Brain surgery         HPI  from the history and physical done on the day of admission:    Patient is a 45 yo male with history of traumatic brain injury, seizure disorder, expressive aphasia who came here with cc of a seizure that he had around 6 pm tonight with LOC but w/o any injury or bowel/urine incontinence. He regained baseline mental status fairly quickly per dad. The patient can't express himself but he denies chest pain/fever/dyspena/N/V/D/C.   Hospital Course:     1. Breakthrough seizure in a patient with traumatic brain injury in the past with expressive aphasia. He was on Keppra and Trileptal, Trileptal levels were less than 3, I confirmed with his father who provides him with his medications that patient  was compliant with all his medications and was taking them as scheduled. He was seen by neurology here. Will be discharged on Keppra and higher than home dose Trileptal. Have given him 6 mg of Trileptal 1. Will follow with his primary neurologist within a week along with PCP will request of monitor compliance with antiseizure medications and monitor the level as appropriate. Patient is currently seizure free and appears to be back to his baseline with significant underlying expressive aphasia.  2. GERD. On PPI continue  3. Hypertension. Continue home regimen   Discharge Condition: Stable  Follow UP  Follow-up Information    Follow up with MORGAN, [name], MD. Schedule an appointment as soon as possible for a visit in 1 week.   Specialty:  Family Medicine   Contact information:   [address] [place] [address] [phone]       Follow up with Cira Servant, DO. Schedule an appointment as soon as possible for a visit in 1 week.   Specialty:  Neurology   Contact information:   [address] [place] [address] [phone]        Consults obtained - Neuro  Diet and Activity recommendation: See Discharge Instructions below  Discharge Instructions       Discharge Instructions    Diet - low sodium heart healthy    Complete by:  As directed      Discharge instructions    Complete by:  As directed   Do not drive, operating heavy machinery, perform activities at heights, swimming or participation in water activities or provide baby sitting services if your were admitted for syncope or siezures until you have seen by Primary MD or a Neurologist and advised to do so again.  Follow with Primary MD MORGAN, [name], MD in 3 days   Get CBC, CMP, 2 view Chest X ray checked  by Primary MD next visit.    Activity: As tolerated with Full fall precautions use walker/cane & assistance as needed   Disposition Home     Diet:   Heart Healthy     For Heart failure patients - Check your Weight same time everyday, if you gain over 2 pounds, or you develop in leg swelling, experience more shortness of breath or chest pain, call your Primary MD immediately. Follow Cardiac Low Salt Diet and 1.5 lit/day fluid restriction.   On your next visit with your primary care physician please Get Medicines reviewed and adjusted.   Please request your Prim.MD to go over all Hospital Tests and Procedure/Radiological results at the follow up, please get all Hospital records sent to your Prim MD by signing hospital release before you go home.   If you experience worsening of your admission symptoms, develop shortness of breath, life threatening emergency, suicidal or homicidal thoughts you must seek medical attention immediately by calling 911 or calling your MD immediately  if symptoms less severe.  You Must read complete instructions/literature along with all the possible adverse reactions/side effects for all the Medicines you take and that have been prescribed to you. Take any new Medicines after you have completely understood and accpet all the possible adverse reactions/side effects.   Do not drive when taking Pain medications.    Do not take more than prescribed Pain, Sleep and Anxiety Medications  Special Instructions: If you have smoked or chewed Tobacco  in the last 2 yrs please stop smoking, stop any regular Alcohol  and or any Recreational drug use.  Wear Seat belts while driving.   Please note  You were cared for by a hospitalist during your hospital stay. If you have any questions about your discharge medications or the care you received while you were in the hospital after you are discharged, you can call the unit and asked to speak with the hospitalist on call if the hospitalist that took care of you is not available. Once you are discharged, your primary care physician will handle any further medical issues. Please note that NO  REFILLS for any discharge medications will be authorized once you are discharged, as it is imperative that you return to your primary care physician (or establish a relationship with a primary care physician if you do not have one) for your aftercare needs so that they can reassess your need for medications and monitor your lab values.     Increase activity slowly    Complete by:  As directed  Discharge Medications       Medication List    TAKE these medications        amantadine 100 MG capsule  Commonly known as:  SYMMETREL  Take 100 mg by mouth 2 (two) times daily.     bromocriptine 2.5 MG tablet  Commonly known as:  PARLODEL  Take 5 mg by mouth 2 (two) times daily.     clonazePAM 0.5 MG tablet  Commonly known as:  KLONOPIN  Take 1 tablet (0.5 mg total) by mouth every 12 (twelve) hours as needed for anxiety (anxiety).     docusate sodium 100 MG capsule  Commonly known as:  COLACE  Take 100 mg by mouth 2 (two) times daily.     escitalopram 10 MG tablet  Commonly known as:  LEXAPRO  Take 10 mg by mouth daily.     furosemide 20 MG tablet  Commonly known as:  LASIX  Take 20 mg by mouth daily.     levETIRAcetam 1000 MG tablet  Commonly known as:  KEPPRA  Take 1 tablet by mouth 2 (two) times daily.     LINZESS 145 MCG Caps capsule  Generic drug:  Linaclotide  Take 145 mcg by mouth daily.     metoprolol tartrate 25 MG tablet  Commonly known as:  LOPRESSOR  Take 12.5 mg by mouth 2 (two) times daily.     ondansetron 4 MG tablet  Commonly known as:  ZOFRAN  Take 1 tablet (4 mg total) by mouth every 6 (six) hours.     Oxcarbazepine 300 MG tablet  Commonly known as:  TRILEPTAL  Take 1 tablet (300 mg total) by mouth 2 (two) times daily.     oxyCODONE-acetaminophen 5-325 MG tablet  Commonly known as:  PERCOCET/ROXICET  Take 1 tablet by mouth every 4 (four) hours as needed for severe pain. Reported on 06/01/2015     pantoprazole 40 MG tablet  Commonly  known as:  PROTONIX  Take 40 mg by mouth daily as needed (for heartburn).     polyethylene glycol packet  Commonly known as:  MIRALAX / GLYCOLAX  Take 17 g by mouth daily as needed for mild constipation.     QUEtiapine 100 MG tablet  Commonly known as:  SEROQUEL  Take 100 mg by mouth at bedtime.     spironolactone 50 MG tablet  Commonly known as:  ALDACTONE  Take 50 mg by mouth daily.        Major procedures and Radiology Reports - PLEASE review detailed and final reports for all details, in brief -    Dg Chest 2 View  06/01/2015  CLINICAL DATA:  Focal seizure follow by colonic tonic seizure lasting 3-4 minutes. History traumatic brain injury, seizure, cirrhosis and alcohol abuse. EXAM: CHEST  2 VIEW COMPARISON:  Chest radiograph February 13, 2015 FINDINGS: Cardiomediastinal silhouette is normal. Mild apical pleural thickening. Lung base scarring, similar to prior examination without pleural effusion or focal consolidation. No pneumothorax. Moderate mid thoracic compression fracture was present previously. IMPRESSION: LEFT lung base scarring without acute cardiopulmonary process. Electronically Signed   By: Awilda Metro M.D.   On: 06/01/2015 21:40    Micro Results      No results found for this or any previous visit (from the past 240 hour(s)).  Today   Subjective    Hewitt Shorts today denies any headache, no chest abdominal pain or weakness   Objective   Blood pressure 104/62, pulse 68, temperature 98 F (36.7  C), temperature source Oral, resp. rate 18, SpO2 96 %.   Intake/Output Summary (Last 24 hours) at 06/02/15 1036 Last data filed at 06/02/15 0742  Gross per 24 hour  Intake   1120 ml  Output    450 ml  Net    670 ml    Exam Awake Alert, underlying expressive aphasia, No new F.N deficits, Normal affect Mercedes.AT,PERRAL Supple Neck,No JVD, No cervical lymphadenopathy appriciated.  Symmetrical Chest wall movement, Good air movement bilaterally, CTAB RRR,No  Gallops,Rubs or new Murmurs, No Parasternal Heave +ve B.Sounds, Abd Soft, Non tender, No organomegaly appriciated, No rebound -guarding or rigidity. No Cyanosis, Clubbing or edema, No new Rash or bruise   Data Review   CBC w Diff: Lab Results  Component Value Date   WBC 9.8 06/01/2015   HGB 13.9 06/01/2015   HCT 40.8 06/01/2015   PLT 122* 06/01/2015   LYMPHOPCT 16 06/01/2015   MONOPCT 9 06/01/2015   EOSPCT 2 06/01/2015   BASOPCT 0 06/01/2015    CMP: Lab Results  Component Value Date   NA 140 06/01/2015   K 3.4* 06/01/2015   CL 102 06/01/2015   CO2 27 06/01/2015   BUN 19 06/01/2015   CREATININE 1.35* 06/01/2015   PROT 7.5 04/09/2015   ALBUMIN 3.9 04/09/2015   BILITOT 0.4 04/09/2015   ALKPHOS 118 04/09/2015   AST 23 04/09/2015   ALT 20 04/09/2015  .   Total Time in preparing paper work, data evaluation and todays exam - 35 minutes  Leroy Sea M.D on 06/02/2015 at 10:36 AM  [facility]   Office  [phone]

## 2015-06-02 NOTE — Telephone Encounter (Signed)
Ideally, he should be off of it.  But was he taking the oxcarbazepine correctly because it was not in his system.

## 2015-06-02 NOTE — ED Notes (Signed)
Notified by pharmacy that oral lacosamide is not due until 1000 06/02/15 and the [facility] will be updated later this am.

## 2015-06-02 NOTE — Consult Note (Signed)
NEURO HOSPITALIST CONSULT NOTE   Requestig physician: Dr. Thedore Mins   Reason for Consult: break through seizure with undetectable trileptal levels.   HPI:                                                                                                                                          MIKYLE [name] is an 45 y.o. male  with history of traumatic brain injury, seizure disorder, expressive aphasia who came here with cc of a seizure that he had around 6 pm tonight with LOC but w/o any injury or bowel/urine incontinence. He regained baseline mental status fairly quickly per dad. The patient can't express himself. Lamictal levels undetectable. Unclear if he has been taking his medication.   Past Medical History  Diagnosis Date  . TBI (traumatic brain injury) (HCC)     07/2013, fall from standing post seizure  . Seizure (HCC)   . DVT (deep venous thrombosis) (HCC)     UE, LE DVT during hospitalization  . H/O renal calculi   . Cirrhosis (HCC)   . Depression   . GERD (gastroesophageal reflux disease)   . H/O ETOH abuse     Past Surgical History  Procedure Laterality Date  . Tracheostomy      feinstein  . Gastrostomy tube placement    . Removal of gastrostomy tube    . Brain surgery      Family History  Problem Relation Age of Onset  . Congestive Heart Failure Brother     Deceased age 31  . Hypercholesterolemia Mother   . Hypertension Mother      Social History:  reports that he has been smoking.  He has never used smokeless tobacco. He reports that he drinks alcohol. He reports that he does not use illicit drugs.  Allergies  Allergen Reactions  . Ativan [Lorazepam] Other (See Comments)    Pt has paradoxical effect with sedating medications.   . Lactulose Hives    MEDICATIONS:                                                                                                                     Prior to Admission:  Prescriptions prior to admission   Medication Sig Dispense Refill Last Dose  . amantadine (SYMMETREL)  100 MG capsule Take 100 mg by mouth 2 (two) times daily.   06/01/2015 at Unknown time  . bromocriptine (PARLODEL) 2.5 MG tablet Take 5 mg by mouth 2 (two) times daily.   06/01/2015 at Unknown time  . clonazePAM (KLONOPIN) 0.5 MG tablet Take 1 tablet (0.5 mg total) by mouth every 12 (twelve) hours as needed for anxiety (anxiety).   06/01/2015 at Unknown time  . docusate sodium (COLACE) 100 MG capsule Take 100 mg by mouth 2 (two) times daily.   06/01/2015 at Unknown time  . escitalopram (LEXAPRO) 10 MG tablet Take 10 mg by mouth daily.   06/01/2015 at Unknown time  . furosemide (LASIX) 20 MG tablet Take 20 mg by mouth daily.   06/01/2015 at Unknown time  . levETIRAcetam (KEPPRA) 1000 MG tablet Take 1 tablet by mouth 2 (two) times daily.   06/01/2015 at Unknown time  . Linaclotide (LINZESS) 145 MCG CAPS capsule Take 145 mcg by mouth daily.   06/01/2015 at Unknown time  . metoprolol tartrate (LOPRESSOR) 25 MG tablet Take 12.5 mg by mouth 2 (two) times daily.   06/01/2015 at 0600  . ondansetron (ZOFRAN) 4 MG tablet Take 1 tablet (4 mg total) by mouth every 6 (six) hours. (Patient taking differently: Take 4 mg by mouth every 8 (eight) hours as needed for nausea or vomiting. ) 12 tablet 0 unknown  . OXcarbazepine (TRILEPTAL) 150 MG tablet Take 1 tablet (150 mg total) by mouth 2 (two) times daily. 120 tablet 2 06/01/2015 at Unknown time  . pantoprazole (PROTONIX) 40 MG tablet Take 40 mg by mouth daily as needed (for heartburn).    06/01/2015 at Unknown time  . polyethylene glycol (MIRALAX / GLYCOLAX) packet Take 17 g by mouth daily as needed for mild constipation.   06/01/2015 at Unknown time  . QUEtiapine (SEROQUEL) 100 MG tablet Take 100 mg by mouth at bedtime.   05/31/2015 at Unknown time  . spironolactone (ALDACTONE) 50 MG tablet Take 50 mg by mouth daily.   06/01/2015 at Unknown time  . oxyCODONE-acetaminophen (PERCOCET/ROXICET) 5-325 MG tablet Take 1 tablet by  mouth every 4 (four) hours as needed for severe pain. Reported on 06/01/2015   Not Taking at Unknown time   Scheduled: . sodium chloride   Intravenous STAT  . amantadine  100 mg Oral BID  . bromocriptine  5 mg Oral BID  . docusate sodium  100 mg Oral BID  . enoxaparin (LOVENOX) injection  40 mg Subcutaneous Q24H  . escitalopram  10 mg Oral Daily  . furosemide  20 mg Oral Daily  . lacosamide  50 mg Oral BID  . levETIRAcetam  1,000 mg Oral BID  . Linaclotide  145 mcg Oral Daily  . metoprolol tartrate  12.5 mg Oral BID  . OXcarbazepine  300 mg Oral BID  . QUEtiapine  100 mg Oral QHS  . spironolactone  50 mg Oral Daily     ROS:  History obtained from unobtainable from patient due to aphasia    Blood pressure 95/62, pulse 62, temperature 98.9 F (37.2 C), temperature source Oral, resp. rate 19, SpO2 96 %.   Neurologic Examination:                                                                                                      HEENT-  Normocephalic, no lesions, without obvious abnormality.  Normal external eye and conjunctiva.  Normal TM's bilaterally.  Normal auditory canals and external ears. Normal external nose, mucus membranes and septum.  Normal pharynx. Cardiovascular- S1, S2 normal, pulses palpable throughout   Lungs- chest clear, no wheezing, rales, normal symmetric air entry Abdomen- normal findings: bowel sounds normal Extremities- no edema Lymph-no adenopathy palpable Musculoskeletal-no joint tenderness, deformity or swelling Skin-warm and dry, no hyperpigmentation, vitiligo, or suspicious lesions  Neurological Examination Mental Status: Alert, with expressive aphasia.  Able to follow 3 step commands without difficulty. Cranial Nerves: II: Discs flat bilaterally; Visual fields grossly normal, pupils equal, round, reactive to light and  accommodation III,IV, VI: ptosis not present, extra-ocular motions intact bilaterally V,VII: smile asymmetric on the right, facial light touch sensation normal bilaterally VIII: hearing normal bilaterally IX,X: uvula rises symmetrically XI: bilateral shoulder shrug XII: midline tongue extension Motor: Right : Upper extremity   5/5    Left:     Upper extremity   5/5  Lower extremity   5/5     Lower extremity   5/5 Tone and bulk:normal tone throughout; no atrophy noted Sensory: Pinprick and light touch intact throughout, bilaterally Deep Tendon Reflexes: 2+ and symmetric throughout Plantars: Right: downgoing   Left: downgoing Cerebellar: normal finger-to-nose, and normal heel-to-shin test Gait: not tested      Lab Results: Basic Metabolic Panel:  Recent Labs Lab 06/01/15 2047  NA 140  K 3.4*  CL 102  CO2 27  GLUCOSE 122*  BUN 19  CREATININE 1.35*  CALCIUM 9.4    Liver Function Tests: No results for input(s): AST, ALT, ALKPHOS, BILITOT, PROT, ALBUMIN in the last 168 hours. No results for input(s): LIPASE, AMYLASE in the last 168 hours. No results for input(s): AMMONIA in the last 168 hours.  CBC:  Recent Labs Lab 06/01/15 2047  WBC 9.8  NEUTROABS 7.2  HGB 13.9  HCT 40.8  MCV 93.6  PLT 122*    Cardiac Enzymes: No results for input(s): CKTOTAL, CKMB, CKMBINDEX, TROPONINI in the last 168 hours.  Lipid Panel: No results for input(s): CHOL, TRIG, HDL, CHOLHDL, VLDL, LDLCALC in the last 168 hours.  CBG:  Recent Labs Lab 06/01/15 2033  GLUCAP 133*    Microbiology: Results for orders placed or performed during the hospital encounter of 04/09/15  Urine culture     Status: None   Collection Time: 04/09/15 12:29 AM  Result Value Ref Range Status   Specimen Description URINE, RANDOM  Final   Special Requests NONE  Final   Culture   Final    >=100,000 COLONIES/mL STAPHYLOCOCCUS SPECIES (COAGULASE NEGATIVE)   Report Status 04/12/2015 FINAL  Final  Organism ID, Bacteria STAPHYLOCOCCUS SPECIES (COAGULASE NEGATIVE)  Final      Susceptibility   Staphylococcus species (coagulase negative) - MIC*    CIPROFLOXACIN <=0.5 SENSITIVE Sensitive     GENTAMICIN <=0.5 SENSITIVE Sensitive     NITROFURANTOIN <=16 SENSITIVE Sensitive     OXACILLIN >=4 RESISTANT Resistant     TETRACYCLINE >=16 RESISTANT Resistant     VANCOMYCIN 4 SENSITIVE Sensitive     TRIMETH/SULFA <=10 SENSITIVE Sensitive     CLINDAMYCIN >=8 RESISTANT Resistant     RIFAMPIN <=0.5 SENSITIVE Sensitive     Inducible Clindamycin NEGATIVE Sensitive     * >=100,000 COLONIES/mL STAPHYLOCOCCUS SPECIES (COAGULASE NEGATIVE)    Coagulation Studies: No results for input(s): LABPROT, INR in the last 72 hours.  Imaging: Dg Chest 2 View  06/01/2015  CLINICAL DATA:  Focal seizure follow by colonic tonic seizure lasting 3-4 minutes. History traumatic brain injury, seizure, cirrhosis and alcohol abuse. EXAM: CHEST  2 VIEW COMPARISON:  Chest radiograph February 13, 2015 FINDINGS: Cardiomediastinal silhouette is normal. Mild apical pleural thickening. Lung base scarring, similar to prior examination without pleural effusion or focal consolidation. No pneumothorax. Moderate mid thoracic compression fracture was present previously. IMPRESSION: LEFT lung base scarring without acute cardiopulmonary process. Electronically Signed   By: Awilda Metro M.D.   On: 06/01/2015 21:40       Assessment and plan per attending neurologist  Felicie Morn PA-C [facility] [phone]  06/02/2015, 9:39 AM   Assessment/Plan: 45 YO with history of Traumatic brain injury, Traumatic SDH status post craniotomy, Seizure disorder presenting with break through seizure in setting of undetectable Trileptal. Likely breakthrough seizure in the setting of non-compliance.    Recommend:  1) Keppra 1 G BID 2) Trileptal 150 mg BID  3) follow up with Dr. Everlena Cooper out patient --neurology S/O

## 2015-06-02 NOTE — Telephone Encounter (Signed)
He was supposed to be taking oxcarbazepine  twice daily.  The blood test indicated he was not taking it.

## 2015-06-02 NOTE — Care Management Note (Signed)
Case Management Note  Patient Details  Name: YADIER BRAMHALL MRN: [id number] Date of Birth: 1970/10/07  Subjective/Objective:                    Action/Plan: Plan is for patient to discharge home with self care. No further needs per CM.   Expected Discharge Date:                  Expected Discharge Plan:  Home/Self Care  In-House Referral:     Discharge planning Services     Post Acute Care Choice:    Choice offered to:     DME Arranged:    DME Agency:     HH Arranged:    HH Agency:     Status of Service:  Completed, signed off  Medicare Important Message Given:    Date Medicare IM Given:    Medicare IM give by:    Date Additional Medicare IM Given:    Additional Medicare Important Message give by:     If discussed at Long Length of Stay Meetings, dates discussed:    Additional Comments:  Kermit Balo, RN 06/02/2015, 10:49 AM

## 2015-06-02 NOTE — Progress Notes (Signed)
Discharge instructions reviewed with patient/family. All questions answered at this time. Transport home by family.   Lorna Strother, RN 

## 2015-06-02 NOTE — Telephone Encounter (Signed)
Father called to let you know patient was at [facility] last night. States patinet had a seizure yesterday, and last night while at hospital. OXcarbazepine was increased to 300 mg BID, stating pt had not previously been on high enough dose. Father states he just wanted to check with you. Hospital made appointment for pt to come in and be seen on  06/23/15. Please advise.

## 2015-06-02 NOTE — Progress Notes (Signed)
Patient with discharge order to home. Family called and update plan. Spoke to patient's mother. His father is en route to see patient.   Sim Boast, RN

## 2015-06-02 NOTE — Telephone Encounter (Signed)
Hospital gave pt a 1000 mg of Keppra. Father wants to know if that's a problem, since he's supposed to be off of it.

## 2015-06-02 NOTE — Discharge Instructions (Signed)
Do not drive, operating heavy machinery, perform activities at heights, swimming or participation in water activities or provide baby sitting services if your were admitted for syncope or siezures until you have seen by Primary MD or a Neurologist and advised to do so again.  Follow with Primary MD MORGAN, [name], MD in 3 days   Get CBC, CMP, 2 view Chest X ray checked  by Primary MD next visit.    Activity: As tolerated with Full fall precautions use walker/cane & assistance as needed   Disposition Home     Diet:   Heart Healthy    For Heart failure patients - Check your Weight same time everyday, if you gain over 2 pounds, or you develop in leg swelling, experience more shortness of breath or chest pain, call your Primary MD immediately. Follow Cardiac Low Salt Diet and 1.5 lit/day fluid restriction.   On your next visit with your primary care physician please Get Medicines reviewed and adjusted.   Please request your Prim.MD to go over all Hospital Tests and Procedure/Radiological results at the follow up, please get all Hospital records sent to your Prim MD by signing hospital release before you go home.   If you experience worsening of your admission symptoms, develop shortness of breath, life threatening emergency, suicidal or homicidal thoughts you must seek medical attention immediately by calling 911 or calling your MD immediately  if symptoms less severe.  You Must read complete instructions/literature along with all the possible adverse reactions/side effects for all the Medicines you take and that have been prescribed to you. Take any new Medicines after you have completely understood and accpet all the possible adverse reactions/side effects.   Do not drive when taking Pain medications.    Do not take more than prescribed Pain, Sleep and Anxiety Medications  Special Instructions: If you have smoked or chewed Tobacco  in the last 2 yrs please stop smoking, stop any  regular Alcohol  and or any Recreational drug use.  Wear Seat belts while driving.   Please note  You were cared for by a hospitalist during your hospital stay. If you have any questions about your discharge medications or the care you received while you were in the hospital after you are discharged, you can call the unit and asked to speak with the hospitalist on call if the hospitalist that took care of you is not available. Once you are discharged, your primary care physician will handle any further medical issues. Please note that NO REFILLS for any discharge medications will be authorized once you are discharged, as it is imperative that you return to your primary care physician (or establish a relationship with a primary care physician if you do not have one) for your aftercare needs so that they can reassess your need for medications and monitor your lab values.

## 2015-06-03 MED ORDER — OXCARBAZEPINE 600 MG PO TABS: 600.0000 mg | ORAL_TABLET | Freq: Two times a day (BID) | ORAL | Status: DC

## 2015-06-03 NOTE — Telephone Encounter (Signed)
Attempted to reach. Line disconnected. Will attempt again in a few hours.

## 2015-06-03 NOTE — Telephone Encounter (Signed)
Spoke with pt's mother. She states she has been giving him the oxcarbazepine 300 mg bid. Made mother read bottle of medication with sig out loud to me. Was correct at 300 mg bid.

## 2015-06-03 NOTE — Addendum Note (Signed)
Addended by: Sheilah Mins A on: 06/03/2015 01:44 PM   Modules accepted: Orders

## 2015-06-03 NOTE — Telephone Encounter (Signed)
1.  He didn't do well with Keppra, so I would have him taper off.  I would have him take 1 tablet at bedtime for 7 days and then stop. 2.  If he has been taking oxcarbazepine  once in AM and once in PM, then we will increase dose to  in AM and  in PM

## 2015-06-03 NOTE — Telephone Encounter (Signed)
Pt only had 1 dose of Keppra at hosptial. Family was concerned because they knew he had ben taken off of it due to intolerance. New 600 mg RX of oxcarbazepine sent in. Mom aware.

## 2015-06-23 ENCOUNTER — Ambulatory Visit (INDEPENDENT_AMBULATORY_CARE_PROVIDER_SITE_OTHER): Payer: Medicaid Other | Admitting: Neurology

## 2015-06-23 ENCOUNTER — Encounter: Payer: Self-pay | Admitting: Neurology

## 2015-06-23 VITALS — BP 110/74 | HR 83 | Wt 138.0 lb

## 2015-06-23 DIAGNOSIS — R4701 Aphasia: Secondary | ICD-10-CM | POA: Diagnosis not present

## 2015-06-23 DIAGNOSIS — G40209 Localization-related (focal) (partial) symptomatic epilepsy and epileptic syndromes with complex partial seizures, not intractable, without status epilepticus: Secondary | ICD-10-CM | POA: Diagnosis not present

## 2015-06-23 DIAGNOSIS — S098XXA Other specified injuries of head, initial encounter: Secondary | ICD-10-CM

## 2015-06-23 DIAGNOSIS — Z720 Tobacco use: Secondary | ICD-10-CM

## 2015-06-23 NOTE — Progress Notes (Signed)
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  Hospital notes reviewed.  He is accompanied by his father who provides some history.  Labs reviewed.  UPDATE: He was admitted to [facility] on 06/01/15 for breakthrough seizure.  He did not have bowel/bladder incontinence.  He regained baseline fairly quickly.  UA was negative.  Trileptal level was less than 3, but his father maintains he was taking the medication as prescribed.  He was discharged on Keppra and continued Trileptal  twice daily.  Since he had side effect to Keppra, we discontinued it and increase Trileptal to  twice daily. He has been doing well.  No seizures.  He was takes clonazepam 0.5mg  for severe episodes of tremor.  For oropharyngeal dysphagia, he underwent esophageal dilation.  HISTORY: On 08/12/13, he had a seizure while in a convenience store.  He fell, hitting his head on the pavement.  He was brought to the ED at [facility].  CT of the head showed a large left subdural hematoma with subarachnoid blood over the left convexity with a 12 mm left to right shift.  He underwent emergent craniotomy.  He was in an induced coma for 4-5 weeks.  His hospital course was complicated by development of a DVT secondary to PICC, requiring heparin drip, VDRF status post extubation, and pneumonia.  He is still taking Xarelto.  He had a G-tube placed for nutrition.  He followed up with an neurologist, Dr. Caswell Corwin.  He was placed on Zoloft for agitation and outbursts.  He presented to the ED at [facility] on 12/06/13 for new shaking episodes with extreme agitation and combativeness.  It seemed to have occurred after taking a dose of risperidone.  CT of the head from 12/06/13, showed  post-surgical changes, with interval clearing of left extra-axial fluid collection, mild increase in ventricular size (left greater than right), hypodense areas involving the left cerebrum and no midline shift or hemorrhage.  He was found to have HCAP and sepsis and was treated with antibiotics.    He resides at [facility].  He has regained much more strength in the right upper extremity.  His mood is stable.  Recently, his PEG and trach was removed.  He has an unsteady gait but ambulates with other people around.  He has global aphasia.  He finished PT/OT and is currently still undergoing speech therapy.  He has had no further seizures.  He denies headache  He has a history of alcoholism.  Prior to the accident, he had two other seizures over a 5-6 year period.  It was attributed to his drinking.  He was never taking an antiepileptic medication.  He has a tenth grade education.  Prior to the accident, he had his own apartment.  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  Prior antiepileptic medications:  Depakote (tremor), Keppra (irritability), Lamictal (rash)  PAST MEDICAL HISTORY: Past Medical History  Diagnosis Date  . TBI (traumatic brain injury) (HCC)     07/2013, fall from standing post seizure  . Seizure (HCC)   . DVT (deep venous thrombosis) (HCC)     UE, LE DVT during hospitalization  . H/O renal calculi   . Cirrhosis (HCC)   . Depression   .  GERD (gastroesophageal reflux disease)   . H/O ETOH abuse     MEDICATIONS: Current Outpatient Prescriptions on File Prior to Visit  Medication Sig Dispense Refill  . amantadine (SYMMETREL) 100 MG capsule Take 100 mg by mouth 2 (two) times daily.    . bromocriptine (PARLODEL) 2.5 MG tablet Take 5 mg by mouth 2 (two) times daily.    . clonazePAM (KLONOPIN) 0.5 MG tablet Take 1 tablet (0.5 mg total) by mouth every 12 (twelve) hours as needed for anxiety (anxiety).    Marland Kitchen docusate sodium (COLACE) 100 MG  capsule Take 100 mg by mouth 2 (two) times daily.    Marland Kitchen escitalopram (LEXAPRO) 10 MG tablet Take 10 mg by mouth daily.    . furosemide (LASIX) 20 MG tablet Take 20 mg by mouth daily.    . Linaclotide (LINZESS) 145 MCG CAPS capsule Take 145 mcg by mouth daily.    . metoprolol tartrate (LOPRESSOR) 25 MG tablet Take 12.5 mg by mouth 2 (two) times daily.    . ondansetron (ZOFRAN) 4 MG tablet Take 1 tablet (4 mg total) by mouth every 6 (six) hours. (Patient taking differently: Take 4 mg by mouth every 8 (eight) hours as needed for nausea or vomiting. ) 12 tablet 0  . OXcarbazepine (TRILEPTAL) 300 MG tablet Take 1 tablet (300 mg total) by mouth 2 (two) times daily. 60 tablet 0  . oxcarbazepine (TRILEPTAL) 600 MG tablet Take 1 tablet (600 mg total) by mouth 2 (two) times daily. 60 tablet 1  . oxyCODONE-acetaminophen (PERCOCET/ROXICET) 5-325 MG tablet Take 1 tablet by mouth every 4 (four) hours as needed for severe pain. Reported on 06/01/2015    . pantoprazole (PROTONIX) 40 MG tablet Take 40 mg by mouth daily as needed (for heartburn).     . polyethylene glycol (MIRALAX / GLYCOLAX) packet Take 17 g by mouth daily as needed for mild constipation.    . QUEtiapine (SEROQUEL) 100 MG tablet Take 100 mg by mouth at bedtime.    Marland Kitchen spironolactone (ALDACTONE) 50 MG tablet Take 50 mg by mouth daily.    . [DISCONTINUED] divalproex (DEPAKOTE ER) 500 MG 24 hr tablet Take 2 tablets (1,000 mg total) by mouth 2 (two) times daily.    . [DISCONTINUED] LamoTRIgine XR 200 MG TB24 Take 1 tablet (200 mg total) by mouth daily. 30 tablet 1   No current facility-administered medications on file prior to visit.    ALLERGIES: Allergies  Allergen Reactions  . Ativan [Lorazepam] Other (See Comments)    Pt has paradoxical effect with sedating medications.   . Lactulose Hives    FAMILY HISTORY: Family History  Problem Relation Age of Onset  . Congestive Heart Failure Brother     Deceased age 21  . Hypercholesterolemia Mother    . Hypertension Mother     SOCIAL HISTORY: Social History   Social History  . Marital Status: Single    Spouse Name: N/A  . Number of Children: N/A  . Years of Education: N/A   Occupational History  . Not on file.   Social History Main Topics  . Smoking status: Current Some Day Smoker -- 0.25 packs/day for 20 years  . Smokeless tobacco: Never Used  . Alcohol Use: 0.0 oz/week    0 Standard drinks or equivalent per week  . Drug Use: No  . Sexual Activity: Not on file   Other Topics Concern  . Not on file   Social History Narrative    REVIEW OF SYSTEMS: Constitutional:  No fevers, chills, or sweats, no generalized fatigue, change in appetite Eyes: No visual changes, double vision, eye pain Ear, nose and throat: No hearing loss, ear pain, nasal congestion, sore throat Cardiovascular: No chest pain, palpitations Respiratory:  No shortness of breath at rest or with exertion, wheezes GastrointestinaI: No nausea, vomiting, diarrhea, abdominal pain, fecal incontinence Genitourinary:  No dysuria, urinary retention or frequency Musculoskeletal:  No neck pain, back pain Integumentary: No rash, pruritus, skin lesions Neurological: as above Psychiatric: No depression, insomnia, anxiety Endocrine: No palpitations, fatigue, diaphoresis, mood swings, change in appetite, change in weight, increased thirst Hematologic/Lymphatic:  No anemia, purpura, petechiae. Allergic/Immunologic: no itchy/runny eyes, nasal congestion, recent allergic reactions, rashes  PHYSICAL EXAM: Filed Vitals:   06/23/15 0746  BP: 110/74  Pulse: 83   General: No acute distress.  Patient appears well-groomed.  normal body habitus. Head:  Normocephalic/atraumatic Eyes:  Fundoscopic exam unremarkable without vessel changes, exudates, hemorrhages or papilledema. Neck: supple, no paraspinal tenderness, full range of motion Heart:  Regular rate and rhythm Lungs:  Clear to auscultation bilaterally Back: No  paraspinal tenderness Neurological Exam: alert, unable to assess orientation, recent and remote memory, fund of knowledge, attention and concentration. Speech fluent and not dysarthric, but unintelligible.  Expressive more than receptive aphasia.  Unable to name or repeat.  Follows some simple commands but not others.  Unable to write.  Right pupil 0.61mm larger than left pupil but both round and reactive to light.  Unable to assess CN V.  Blinks to threat bilaterally.  Otherwise, CN II-XII intact.  mild increased tone in right upper extremity, no fasciculations.  Motor 5-/5 on the right upper and lower extremities.  5/5 left side.  Unable to assess sensation.  DTR 3+ right upper and lower extremities.  2+ on left.  Trace postural and kinetic tremor of upper extremities.  Wide-based gait.  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  Shon Millet, DO  CC:  Darin Engels, MD

## 2015-06-23 NOTE — Progress Notes (Signed)
Chart forwarded.  

## 2015-06-23 NOTE — Patient Instructions (Addendum)
1.  Continue oxcarbazepine  twice daily 2.  Check trough level 3.  Follow up in 4 months. 4.  Stop smoking

## 2015-06-24 ENCOUNTER — Other Ambulatory Visit: Payer: Medicaid Other

## 2015-06-24 DIAGNOSIS — R4701 Aphasia: Secondary | ICD-10-CM

## 2015-06-24 DIAGNOSIS — S098XXA Other specified injuries of head, initial encounter: Secondary | ICD-10-CM

## 2015-06-24 DIAGNOSIS — G40209 Localization-related (focal) (partial) symptomatic epilepsy and epileptic syndromes with complex partial seizures, not intractable, without status epilepticus: Secondary | ICD-10-CM

## 2015-06-26 ENCOUNTER — Telehealth: Payer: Self-pay

## 2015-06-26 NOTE — Telephone Encounter (Signed)
It sounds vague.  We just increased the dose of his oxcarbazepine, so I would continue to monitor.  No changes at this time.

## 2015-06-26 NOTE — Telephone Encounter (Signed)
Seizure was unwitnessed, told mother about it afterwards. No lingering side effects from seizure noted.

## 2015-06-26 NOTE — Telephone Encounter (Signed)
Mother aware

## 2015-06-26 NOTE — Telephone Encounter (Signed)
Mother called and said he thinks Hector [name] had a seizure last night. He told her his hand was tingling and he feet drew up.  Please call her. [phone]

## 2015-06-26 NOTE — Telephone Encounter (Signed)
She is probably talking about the amantadine, which I did not prescribe.

## 2015-06-26 NOTE — Telephone Encounter (Signed)
Spoke with mother, verbalized understanding. Did ask about increasing his Parkinson's medication has he is still shaking. Please advise.

## 2015-06-27 LAB — CARBAMAZEPINE, FREE AND TOTAL
CARBAMAZEPINE METABOLITE -: 1.5 ug/mL (ref 0.2–2.0)
Carbamazepine Metabolite/: 0.6 ug/mL
Carbamazepine Metabolite: 0.9 ug/mL (ref 0.1–1.0)
Carbamazepine, Bound: 2.6 ug/mL
Carbamazepine, Free: 4.3 ug/mL — ABNORMAL HIGH (ref 1.0–3.0)
Carbamazepine, Total: 6.9 ug/mL (ref 4.0–12.0)

## 2015-07-23 ENCOUNTER — Emergency Department (HOSPITAL_COMMUNITY)
Admission: EM | Admit: 2015-07-23 | Discharge: 2015-07-23 | Disposition: A | Discharge status: Home / Self Care | Payer: Medicaid Other | Attending: Emergency Medicine | Admitting: Emergency Medicine

## 2015-07-23 ENCOUNTER — Encounter (HOSPITAL_COMMUNITY): Payer: Self-pay | Admitting: Emergency Medicine

## 2015-07-23 ENCOUNTER — Telehealth: Payer: Self-pay

## 2015-07-23 DIAGNOSIS — Z86718 Personal history of other venous thrombosis and embolism: Secondary | ICD-10-CM | POA: Insufficient documentation

## 2015-07-23 DIAGNOSIS — F329 Major depressive disorder, single episode, unspecified: Secondary | ICD-10-CM | POA: Insufficient documentation

## 2015-07-23 DIAGNOSIS — F17200 Nicotine dependence, unspecified, uncomplicated: Secondary | ICD-10-CM | POA: Insufficient documentation

## 2015-07-23 DIAGNOSIS — Z79899 Other long term (current) drug therapy: Secondary | ICD-10-CM | POA: Diagnosis not present

## 2015-07-23 DIAGNOSIS — Z87442 Personal history of urinary calculi: Secondary | ICD-10-CM | POA: Diagnosis not present

## 2015-07-23 DIAGNOSIS — K219 Gastro-esophageal reflux disease without esophagitis: Secondary | ICD-10-CM | POA: Insufficient documentation

## 2015-07-23 DIAGNOSIS — Z87828 Personal history of other (healed) physical injury and trauma: Secondary | ICD-10-CM | POA: Diagnosis not present

## 2015-07-23 DIAGNOSIS — G40909 Epilepsy, unspecified, not intractable, without status epilepticus: Secondary | ICD-10-CM | POA: Diagnosis not present

## 2015-07-23 DIAGNOSIS — R569 Unspecified convulsions: Secondary | ICD-10-CM

## 2015-07-23 LAB — BASIC METABOLIC PANEL
ANION GAP: 10 (ref 5–15)
BUN: 27 mg/dL — ABNORMAL HIGH (ref 6–20)
CALCIUM: 9.9 mg/dL (ref 8.9–10.3)
CHLORIDE: 105 mmol/L (ref 101–111)
CO2: 28 mmol/L (ref 22–32)
Creatinine, Ser: 1.52 mg/dL — ABNORMAL HIGH (ref 0.61–1.24)
GFR calc non Af Amer: 53 mL/min — ABNORMAL LOW (ref >60)
GLUCOSE: 116 mg/dL — AB (ref 65–99)
POTASSIUM: 4.6 mmol/L (ref 3.5–5.1)
Sodium: 143 mmol/L (ref 135–145)

## 2015-07-23 LAB — CBG MONITORING, ED: Glucose-Capillary: 139 mg/dL — ABNORMAL HIGH (ref 65–99)

## 2015-07-23 MED ORDER — OXCARBAZEPINE 300 MG/5ML PO SUSP: 1800.0000 mg | Freq: Two times a day (BID) | ORAL | Status: DC

## 2015-07-23 NOTE — ED Notes (Signed)
Bed: WA09 Expected date:  Expected time:  Means of arrival:  Comments: 45 yo M, witnessed seizure

## 2015-07-23 NOTE — Telephone Encounter (Signed)
Pt was out shopping with family, had another seizure. Mom says about 15 minutes long. EMS was called, [facility]. Mom just wanted you to know so you can review Hospital notes

## 2015-07-23 NOTE — ED Notes (Signed)
[facility] presents with a 45 yo male with a hx of TBI and seizures with a seizure today inside of family vehicle in back seat.  Pt had hx of seizures prior to TBI and had seizure/hit head on curb and had TBI around 2 years ago.  [facility] member [facility] that doctors have been trying to regulate seizure activity and has been on 3 different seizure meds over last month; they don't usually call EMS unless seizure activity last longer than 15 minutes and this episode did.  Pt was conscious upon [facility] arrival.  Pupils are usuallyunequal due to TBI but were non-reactive on this episode.  Pt usually has right sided weakness and right sided facial droop.  Pt cannot verbalize due to TBI but understands and follows commands

## 2015-07-23 NOTE — ED Notes (Signed)
Patient was alert, oriented and stable upon discharge. RN went over AVS and patient had no further questions.  

## 2015-07-23 NOTE — Discharge Instructions (Signed)
Drink plenty of fluids.  Follow up with your neurologist.  Epilepsy Epilepsy is a disorder in which a person has repeated seizures over time. A seizure is a release of abnormal electrical activity in the brain. Seizures can cause a change in attention, behavior, or the ability to remain awake and alert (altered mental status). Seizures often involve uncontrollable shaking (convulsions).  Most people with epilepsy lead normal lives. However, people with epilepsy are at an increased risk of falls, accidents, and injuries. Therefore, it is important to begin treatment right away. CAUSES  Epilepsy has many possible causes. Anything that disturbs the normal pattern of brain cell activity can lead to seizures. This may include:   Head injury.  Birth trauma.  High fever as a child.  Stroke.  Bleeding into or around the brain.  Certain drugs.  Prolonged low oxygen, such as what occurs after CPR efforts.  Abnormal brain development.  Certain illnesses, such as meningitis, encephalitis (brain infection), malaria, and other infections.  An imbalance of nerve signaling chemicals (neurotransmitters).  SIGNS AND SYMPTOMS  The symptoms of a seizure can vary greatly from one person to another. Right before a seizure, you may have a warning (aura) that a seizure is about to occur. An aura may include the following symptoms:  Fear or anxiety.  Nausea.  Feeling like the room is spinning (vertigo).  Vision changes, such as seeing flashing lights or spots. Common symptoms during a seizure include:  Abnormal sensations, such as an abnormal smell or a bitter taste in the mouth.   Sudden, general body stiffness.   Convulsions that involve rhythmic jerking of the face, arm, or leg on one or both sides.   Sudden change in consciousness.   Appearing to be awake but not responding.   Appearing to be asleep but cannot be awakened.   Grimacing, chewing, lip smacking, drooling, tongue  biting, or loss of bowel or bladder control. After a seizure, you may feel sleepy for a while. DIAGNOSIS  Your health care provider will ask about your symptoms and take a medical history. Descriptions from any witnesses to your seizures will be very helpful in the diagnosis. A physical exam, including a detailed neurological exam, is necessary. Various tests may be done, such as:   An electroencephalogram (EEG). This is a painless test of your brain waves. In this test, a diagram is created of your brain waves. These diagrams can be interpreted by a specialist.  An MRI of the brain.   A CT scan of the brain.   A spinal tap (lumbar puncture, LP).  Blood tests to check for signs of infection or abnormal blood chemistry. TREATMENT  There is no cure for epilepsy, but it is generally treatable. Once epilepsy is diagnosed, it is important to begin treatment as soon as possible. For most people with epilepsy, seizures can be controlled with medicines. The following may also be used:  A pacemaker for the brain (vagus nerve stimulator) can be used for people with seizures that are not well controlled by medicine.  Surgery on the brain. For some people, epilepsy eventually goes away. HOME CARE INSTRUCTIONS   Follow your health care provider's recommendations on driving and safety in normal activities.  Get enough rest. Lack of sleep can cause seizures.  Only take over-the-counter or prescription medicines as directed by your health care provider. Take any prescribed medicine exactly as directed.  Avoid any known triggers of your seizures.  Keep a seizure diary. Record what you  recall about any seizure, especially any possible trigger.   Make sure the people you live and work with know that you are prone to seizures. They should receive instructions on how to help you. In general, a witness to a seizure should:   Cushion your head and body.   Turn you on your side.   Avoid  unnecessarily restraining you.   Not place anything inside your mouth.   Call for emergency medical help if there is any question about what has occurred.   Follow up with your health care provider as directed. You may need regular blood tests to monitor the levels of your medicine.  SEEK MEDICAL CARE IF:   You develop signs of infection or other illness. This might increase the risk of a seizure.   You seem to be having more frequent seizures.   Your seizure pattern is changing.  SEEK IMMEDIATE MEDICAL CARE IF:   You have a seizure that does not stop after a few moments.   You have a seizure that causes any difficulty in breathing.   You have a seizure that results in a very severe headache.   You have a seizure that leaves you with the inability to speak or use a part of your body.    This information is not intended to replace advice given to you by your health care provider. Make sure you discuss any questions you have with your health care provider.   Document Released: 04/12/2005 Document Revised: 01/31/2013 Document Reviewed: 11/22/2012 Elsevier Interactive Patient Education [employer]

## 2015-07-23 NOTE — ED Provider Notes (Signed)
CSN: [id number]     Arrival date & time 07/23/15  1526 History   First MD Initiated Contact with Patient 07/23/15 1625     Chief Complaint  Patient presents with  . Seizures     (Consider location/radiation/quality/duration/timing/severity/associated sxs/prior Treatment) Patient is a 45 y.o. male presenting with seizures. The history is provided by the patient and a parent.  Seizures Seizure type:  Grand mal Initial focality:  None Episode characteristics: abnormal movements   Postictal symptoms: confusion   Return to baseline: yes   Severity:  Moderate Duration:  15 minutes Timing:  Once Progression:  Unchanged Recent head injury:  No recent head injuries  45 yo M With a chief complaint of a seizure. This happened once today. Lasted about 15 minutes. Tonic-clonic activity. Difficult his prior seizures. 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 They've tried multiple medications without improvement. Patient last seen his neurologist and is on Trileptal 600 mg twice a day. They deny any noncompliance. Denies any vomiting.  Past Medical History  Diagnosis Date  . TBI (traumatic brain injury) (HCC)     07/2013, fall from standing post seizure  . Seizure (HCC)   . DVT (deep venous thrombosis) (HCC)     UE, LE DVT during hospitalization  . H/O renal calculi   . Cirrhosis (HCC)   . Depression   . GERD (gastroesophageal reflux disease)   . H/O ETOH abuse    Past Surgical History  Procedure Laterality Date  . Tracheostomy      feinstein  . Gastrostomy tube placement    . Removal of gastrostomy tube    . Brain surgery     Family History  Problem Relation Age of Onset  . Congestive Heart Failure Brother     Deceased age 24  . Hypercholesterolemia Mother   . Hypertension Mother    Social History  Substance Use Topics  . Smoking status: Current Some Day Smoker -- 0.25 packs/day for 20 years  . Smokeless tobacco: Never  Used  . Alcohol Use: 0.0 oz/week    0 Standard drinks or equivalent per week    Review of Systems  Constitutional: Negative for fever and chills.  HENT: Negative for congestion and facial swelling.   Eyes: Negative for discharge and visual disturbance.  Respiratory: Negative for shortness of breath.   Cardiovascular: Negative for chest pain and palpitations.  Gastrointestinal: Negative for vomiting, abdominal pain and diarrhea.  Musculoskeletal: Negative for myalgias and arthralgias.  Skin: Negative for color change and rash.  Neurological: Positive for seizures. Negative for tremors, syncope and headaches.  Psychiatric/Behavioral: Negative for confusion and dysphoric mood.      Allergies  Ativan and Lactulose  Home Medications   Prior to Admission medications   Medication Sig Start Date End Date Taking? Authorizing Provider  amantadine (SYMMETREL) 100 MG capsule Take 100 mg by mouth 2 (two) times daily.   Yes Historical Provider, MD  bromocriptine (PARLODEL) 2.5 MG tablet Take 5 mg by mouth 2 (two) times daily.   Yes Historical Provider, MD  clonazePAM (KLONOPIN) 0.5 MG tablet Take 1 tablet (0.5 mg total) by mouth every 12 (twelve) hours as needed for anxiety (anxiety). 02/15/15  Yes Vassie Loll, MD  clonazePAM (KLONOPIN) 0.5 MG tablet Take 0.5 mg by mouth every 12 (twelve) hours as needed for anxiety.   Yes Historical Provider, MD  docusate sodium (COLACE) 100 MG capsule Take 100 mg by mouth 2 (  two) times daily. Reported on 07/23/2015   Yes Historical Provider, MD  ENSURE PLUS (ENSURE PLUS) LIQD Take 237 mLs by mouth 2 (two) times daily between meals.   Yes Historical Provider, MD  escitalopram (LEXAPRO) 10 MG tablet Take 10 mg by mouth daily.   Yes Historical Provider, MD  furosemide (LASIX) 20 MG tablet Take 20 mg by mouth daily.   Yes Historical Provider, MD  furosemide (LASIX) 40 MG tablet Take 20 mg by mouth daily.  07/02/15  Yes Historical Provider, MD  Linaclotide  Karlene Einstein(LINZESS) 145 MCG CAPS capsule Take 145 mcg by mouth daily.   Yes Historical Provider, MD  metoprolol tartrate (LOPRESSOR) 25 MG tablet Take 12.5 mg by mouth 2 (two) times daily.   Yes Historical Provider, MD  oxcarbazepine (TRILEPTAL) 600 MG tablet Take 1 tablet (600 mg total) by mouth 2 (two) times daily. 06/03/15  Yes Adam Mliss Fritz Jaffe, DO  QUEtiapine (SEROQUEL) 100 MG tablet Take 100 mg by mouth at bedtime.   Yes Historical Provider, MD  spironolactone (ALDACTONE) 50 MG tablet Take 50 mg by mouth daily.   Yes Historical Provider, MD  sulfamethoxazole-trimethoprim (BACTRIM DS,SEPTRA DS) 800-160 MG tablet Take 1 tablet by mouth 2 (two) times daily. ABT Start Date 07/23/15 & End Date 08/01/15.   Yes Historical Provider, MD  ondansetron (ZOFRAN) 4 MG tablet Take 1 tablet (4 mg total) by mouth every 6 (six) hours. Patient taking differently: Take 4 mg by mouth every 8 (eight) hours as needed for nausea or vomiting.  12/10/13   Junious SilkHannah Merrell, PA-C  OXcarbazepine (TRILEPTAL) 300 MG tablet Take 1 tablet (300 mg total) by mouth 2 (two) times daily. Patient not taking: Reported on 07/23/2015 06/02/15   Leroy SeaPrashant K Singh, MD  pantoprazole (PROTONIX) 40 MG tablet Take 40 mg by mouth daily as needed (for heartburn).     Historical Provider, MD  polyethylene glycol (MIRALAX / GLYCOLAX) packet Take 17 g by mouth daily as needed for mild constipation.    Historical Provider, MD   BP 102/79 mmHg  Pulse 76  Temp(Src) 97.6 F (36.4 C) (Oral)  Resp 20  SpO2 97% Physical Exam  Constitutional: He is oriented to person, place, and time. He appears well-developed and well-nourished.  HENT:  Head: Normocephalic and atraumatic.  Eyes: EOM are normal. Pupils are equal, round, and reactive to light.  Neck: Normal range of motion. Neck supple. No JVD present.  Cardiovascular: Normal rate and regular rhythm.  Exam reveals no gallop and no friction rub.   No murmur heard. Pulmonary/Chest: No respiratory distress. He has no  wheezes.  Abdominal: He exhibits no distension. There is no tenderness. There is no rebound and no guarding.  Musculoskeletal: Normal range of motion.  Neurological: He is alert and oriented to person, place, and time.  Some difficulty with speech  Skin: No rash noted. No pallor.  Psychiatric: He has a normal mood and affect. His behavior is normal.  Nursing note and vitals reviewed.   ED Course  Procedures (including critical care time) Labs Review Labs Reviewed  BASIC METABOLIC PANEL - Abnormal; Notable for the following:    Glucose, Bld 116 (*)    BUN 27 (*)    Creatinine, Ser 1.52 (*)    GFR calc non Af Amer 53 (*)    All other components within normal limits  CBG MONITORING, ED - Abnormal; Notable for the following:    Glucose-Capillary 139 (*)    All other components within normal limits  10-HYDROXYCARBAZEPINE  CBG MONITORING, ED    Imaging Review No results found. I have personally reviewed and evaluated these images and lab results as part of my medical decision-making.   EKG Interpretation None      MDM   Final diagnoses:  Seizure [facility])    45 yo M with a chief complaint of a seizure. Patient is currently back to his baseline. Family feels that he may be slightly dehydrated is not been taking in as much is normal. With history of dramatic brain injury will obtain a BMP to rule out hyponatremia.     No noted hyponatremia. Patient's creatinine is mildly above his baseline. Discussed with family will increase fluid intake.   I have discussed the diagnosis/risks/treatment options with the patient and family and believe the pt to be eligible for discharge home to follow-up with PCP. We also discussed returning to the ED immediately if new or worsening sx occur. We discussed the sx which are most concerning (e.g., sudden worsening pain, fever, inability to tolerate by mouth, continued seizures) that necessitate immediate return. Medications administered to the  patient during their visit and any new prescriptions provided to the patient are listed below.  Medications given during this visit Medications - No data to display  Discharge Medication List as of 07/23/2015  6:47 PM      The patient appears reasonably screen and/or stabilized for discharge and I doubt any other medical condition or other [facility] requiring further screening, evaluation, or treatment in the ED at this time prior to discharge.    Melene Plan, DO 07/24/15 1610

## 2015-07-24 MED ORDER — TOPIRAMATE 25 MG PO TABS: ORAL_TABLET | ORAL | Status: DC

## 2015-07-24 NOTE — Telephone Encounter (Signed)
I think we should add another antiepileptic medication in addition to the Trileptal. I would like to start Topamax.  Start 25mg  twice daily for 7 days  Then 50mg  twice daily for 7 days  Then 100mg  twice daily.  We will continue at this dose.  Possible side effects include: impaired thinking, sedation, paresthesias (numbness and tingling) and weight loss.  It may cause dehydration and there is a small risk for kidney stones, so make sure to stay hydrated with water during the day.  There is also a very small risk for glaucoma, so if you notice any change in your vision while taking this medication, see an ophthalmologist.    He should remain on the Trileptal 600mg  twice daily

## 2015-07-24 NOTE — Telephone Encounter (Signed)
Mother called again last night at 7:48 and spoke to answering service. States that they were sent home from E.R. After about 4 hours. He then had another seizure that lasted about 3 minutes. Unsure what to do. Answering Service's RN advised them to return to E.R. To seek treatment. Please advise.

## 2015-07-24 NOTE — Addendum Note (Signed)
Addended by: Sheilah MinsFOX, JADA A on: 07/24/2015 09:32 AM   Modules accepted: Orders

## 2015-07-24 NOTE — Telephone Encounter (Signed)
RX sent in. Spoke with father. He is aware. Will pick up today.

## 2015-07-24 NOTE — Telephone Encounter (Signed)
Noted.  I reviewed the ED note.  The trileptal level is still pending, so I want to see what the level is to determine next step.

## 2015-07-25 LAB — 10-HYDROXYCARBAZEPINE: TRILIPTAL/MTB(OXCARBAZEPIN): 18 ug/mL (ref 10–35)

## 2015-08-01 ENCOUNTER — Telehealth: Payer: Self-pay | Admitting: Neurology

## 2015-08-01 DIAGNOSIS — S069X5D Unspecified intracranial injury with loss of consciousness greater than 24 hours with return to pre-existing conscious level, subsequent encounter: Secondary | ICD-10-CM

## 2015-08-01 NOTE — Telephone Encounter (Signed)
Mother called about patient getting so upset and mean lately and is wondering if the Medication is causing this.  Please call her back .

## 2015-08-01 NOTE — Telephone Encounter (Signed)
Spoke with father. Within the last week pt has been getting upset very easy. He is not  Aggressive, but he is being belligerent, loud, etc. Would like to know if this is a possible side effect from Topamax, which he started on 07/24/15. Please advise.

## 2015-08-01 NOTE — Telephone Encounter (Signed)
Topamax is a mood stabilizing medication.  I would recommend referral to a psychiatrist to help us manage his mood changes.

## 2015-08-04 NOTE — Telephone Encounter (Signed)
Referral placed. Will call and have family call To set up an appointment, please call [phone].

## 2015-08-04 NOTE — Telephone Encounter (Signed)
Spoke with father. Will call to schedule. Also called back at fathers request and left number and message on VM for their future reference.

## 2015-08-11 ENCOUNTER — Telehealth: Payer: Self-pay | Admitting: Neurology

## 2015-08-11 MED ORDER — OXCARBAZEPINE 600 MG PO TABS: 600.0000 mg | ORAL_TABLET | Freq: Two times a day (BID) | ORAL | Status: DC

## 2015-08-11 NOTE — Telephone Encounter (Signed)
He should remain on the Trileptal 600mg  twice daily

## 2015-08-11 NOTE — Telephone Encounter (Signed)
Hector [name] the dad of Hector [name] [birth year]. Hector [name] called requesting a refill on his son's seizure medication (oxcarvazepine 600 MG). He uses [facility] [address] His contact number is [phone].  He has enough medicine for today only. Thank you

## 2015-08-11 NOTE — Telephone Encounter (Signed)
Done

## 2015-08-21 ENCOUNTER — Other Ambulatory Visit: Payer: Self-pay | Admitting: Neurology

## 2015-08-22 NOTE — Telephone Encounter (Signed)
Last OV: 06/23/15 Next OV: 10/22/15  Then 100mg  twice daily. We will continue at this dose. Then 100mg  twice daily. We will continue at this dose.

## 2015-09-10 ENCOUNTER — Telehealth: Payer: Self-pay

## 2015-09-10 NOTE — Telephone Encounter (Signed)
Pt's mom called, pt has expressed to his mother that all his insides are shaking all over. Advised mom to contact PCP while I sent message to provider for advise.

## 2015-09-10 NOTE — Telephone Encounter (Signed)
I think it is behavioral.  I don't think they are seizures.

## 2015-09-11 NOTE — Telephone Encounter (Signed)
Left message on father's vm.

## 2015-09-23 ENCOUNTER — Telehealth: Payer: Self-pay | Admitting: Neurology

## 2015-09-23 NOTE — Telephone Encounter (Signed)
Pt mother Harriett Sineancy called and needs to talk to someone about her son having seizures he had one in the middle of the night on Saturday please call [phone]

## 2015-09-23 NOTE — Telephone Encounter (Signed)
Patient's mom made aware. Note resent to Dr. Everlena CooperJaffe and Mel AlmondJada to address tomorrow.

## 2015-09-23 NOTE — Telephone Encounter (Signed)
Spoke with patient's mother. She states that patient "told her" he had a seizure in the middle of the night on Saturday night. This is the first seizure since March (mother reports). Patient has brain injury and can not speak. She states he make the shaking motion on Sunday morning and she asked if he had a seizure in the middle of the night and he shook his head yes. No one witnessed the seizure. I asked if this was different than on 09/10/15 when they called and reported that his insides felt like they were shaking. She states its different- that she believes he had a panic attack on that occasion. Patient was referred to psychiatry but never made an appt. Patient's mother states she didn't see a point in a psychiatry appt when the patient can't speak. I explained that they can still prescribe medications and manage anxiety and encouraged her to make that appt. She states patient has not missed any doses of medication and has not lost consciousness during these seizures - he will just have uncontrollable shaking. She is worried about him having an episode in the shower and slipping and falling. Please advise.

## 2015-09-23 NOTE — Telephone Encounter (Addendum)
Please have the family monitor him closely and consider using a shower chair for safety.   Please reassure the patient that Dr. Everlena CooperJaffe will be back in the office tomorrow and can discuss any medications changes, if needed.  He is already on relatively high doses of four different antiepileptic medications and it is unclear if these spells are truly seizures or abnormal movements.

## 2015-09-24 MED ORDER — LACOSAMIDE 50 MG PO TABS: 50.0000 mg | ORAL_TABLET | Freq: Two times a day (BID) | ORAL | Status: DC

## 2015-09-24 NOTE — Telephone Encounter (Signed)
Mom aware. Does [facility] [address]. RX sent in.

## 2015-09-24 NOTE — Telephone Encounter (Signed)
I don't think the shaking motion is seizure.  I'm also not too sure how reliable he is to tell us if he is having seizure.  I would recommend that one of his family members monitor him to determine if he is in fact having seizures.  I also would like to reiterate that he speak with his PCP regarding management for anxiety.  If they would like to treat for possible breakthrough seizures, we can start a third seizure medication, namely lacosamide 50mg  twice.

## 2015-10-21 ENCOUNTER — Telehealth: Payer: Self-pay

## 2015-10-21 NOTE — Telephone Encounter (Signed)
-----   Message from Richarda OverlieJada A Fox, [place] sent at 06/23/2015  8:01 AM EST ----- BMP

## 2015-10-21 NOTE — Telephone Encounter (Signed)
Pt is coming in tomorrow, and lab is closing early today. Will have to wait til pt is in office to have done..Marland Kitchen

## 2015-10-22 ENCOUNTER — Encounter: Payer: Self-pay | Admitting: Neurology

## 2015-10-22 ENCOUNTER — Ambulatory Visit (INDEPENDENT_AMBULATORY_CARE_PROVIDER_SITE_OTHER): Payer: Medicaid Other | Admitting: Neurology

## 2015-10-22 ENCOUNTER — Other Ambulatory Visit (INDEPENDENT_AMBULATORY_CARE_PROVIDER_SITE_OTHER): Payer: Medicaid Other

## 2015-10-22 VITALS — BP 124/62 | HR 63 | Ht 66.0 in | Wt 132.0 lb

## 2015-10-22 DIAGNOSIS — R4701 Aphasia: Secondary | ICD-10-CM | POA: Diagnosis not present

## 2015-10-22 DIAGNOSIS — Z720 Tobacco use: Secondary | ICD-10-CM | POA: Diagnosis not present

## 2015-10-22 DIAGNOSIS — R4189 Other symptoms and signs involving cognitive functions and awareness: Secondary | ICD-10-CM

## 2015-10-22 DIAGNOSIS — G40209 Localization-related (focal) (partial) symptomatic epilepsy and epileptic syndromes with complex partial seizures, not intractable, without status epilepticus: Secondary | ICD-10-CM

## 2015-10-22 DIAGNOSIS — S098XXA Other specified injuries of head, initial encounter: Secondary | ICD-10-CM

## 2015-10-22 DIAGNOSIS — S069X5D Unspecified intracranial injury with loss of consciousness greater than 24 hours with return to pre-existing conscious level, subsequent encounter: Secondary | ICD-10-CM

## 2015-10-22 LAB — BASIC METABOLIC PANEL
BUN: 22 mg/dL (ref 6–23)
CALCIUM: 9.8 mg/dL (ref 8.4–10.5)
CO2: 27 meq/L (ref 19–32)
CREATININE: 1.52 mg/dL — AB (ref 0.40–1.50)
Chloride: 107 mEq/L (ref 96–112)
GFR: 52.52 mL/min — AB (ref >60.00)
GLUCOSE: 95 mg/dL (ref 70–99)
Potassium: 3.8 mEq/L (ref 3.5–5.1)
Sodium: 142 mEq/L (ref 135–145)

## 2015-10-22 NOTE — Progress Notes (Signed)
Chart forwarded.  
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
## 2015-10-22 NOTE — Patient Instructions (Signed)
1.  We will continue oxcarbazepine 600mg  twice daily and topiramate 100mg  twice daily. 2.  Use clonazepam as needed for agitation or tremor spells 3.  Will check BMP 4.  Follow up in 6 months.

## 2015-10-23 ENCOUNTER — Telehealth: Payer: Self-pay

## 2015-10-23 NOTE — Telephone Encounter (Signed)
Message relayed to patient's mother. Verbalized understanding and denied questions.

## 2015-10-23 NOTE — Telephone Encounter (Signed)
-----   Message from Hector DallasAdam R Jaffe, DO sent at 10/23/2015  6:51 AM EDT ----- Labs look okay.  The creatinine is borderline elevated which may suggest mild dehydration, but it is nothing significant.  I would suggest to make sure he keeps hydrated.

## 2015-11-06 ENCOUNTER — Ambulatory Visit: Payer: Self-pay | Admitting: Neurology

## 2015-11-19 ENCOUNTER — Other Ambulatory Visit: Payer: Self-pay | Admitting: Neurology

## 2015-12-01 ENCOUNTER — Telehealth: Payer: Self-pay

## 2015-12-01 NOTE — Telephone Encounter (Signed)
Father, Konrad DoloresLester, called. Stated pt's mood is deteriorating . States he is suicidal. Advised father to see care at [facility]. Pt was given the 24-hour HelpLine at [phone], and was told that they do take walk-ins.

## 2016-01-06 IMAGING — US US RENAL
1 series · 14 of 25 positions shown · non-contrast
Comparison: None.

CLINICAL DATA: Right flank pain.

EXAM:
RENAL/URINARY TRACT ULTRASOUND COMPLETE

[Series 1: us renal · 0.18mm/px · 14 of 27 slices shown]
[im 1/27]
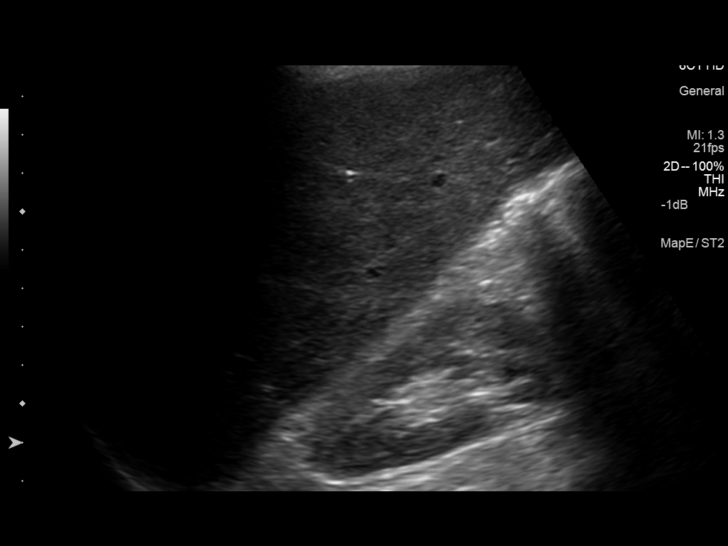
[im 3/27]
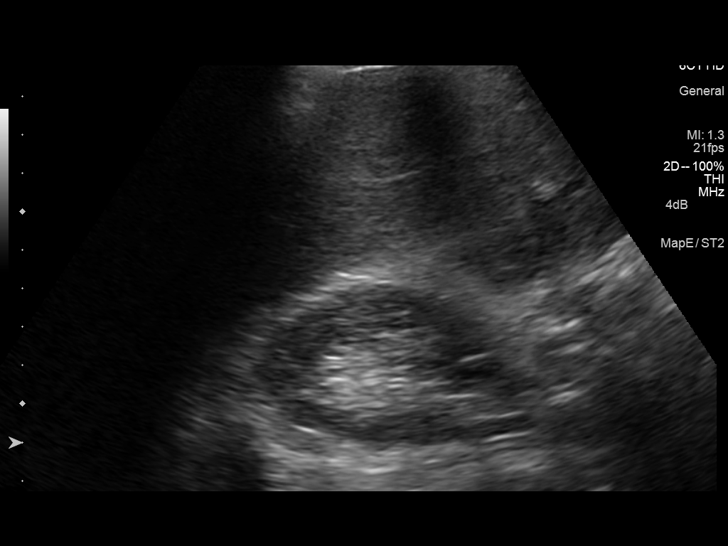
[im 5/27]
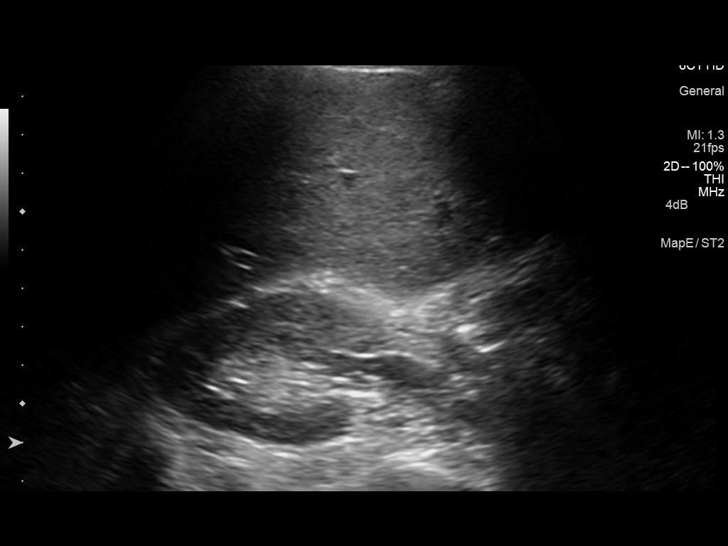
[im 7/27]
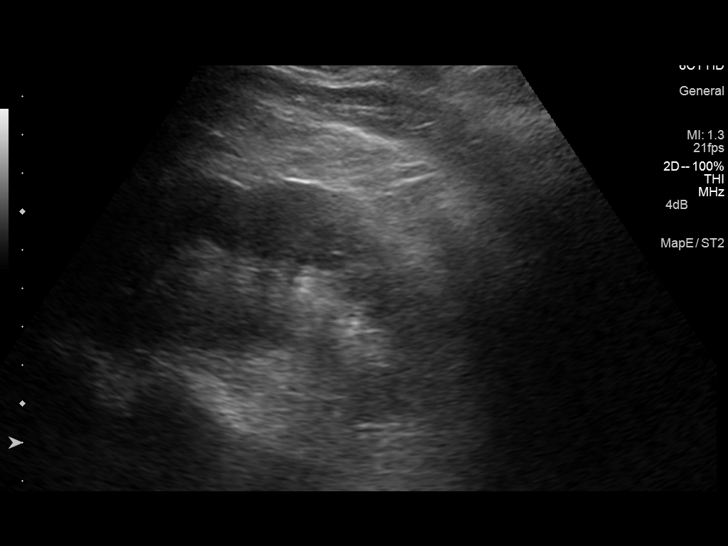
[im 9/27]
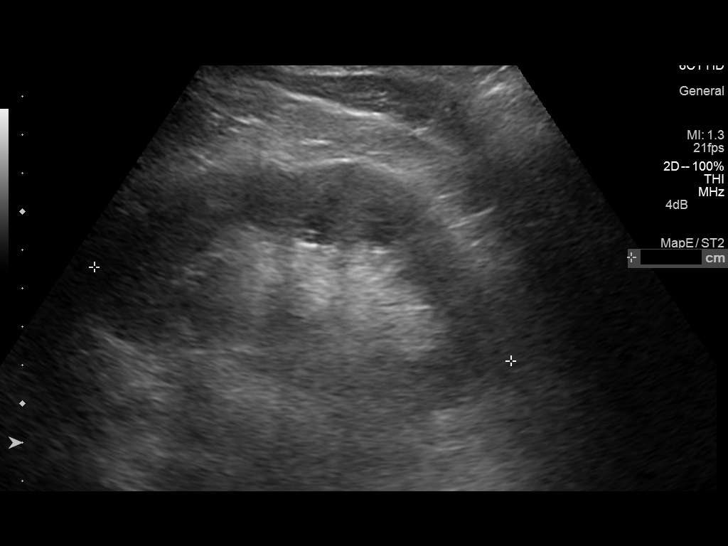
[im 10/27]
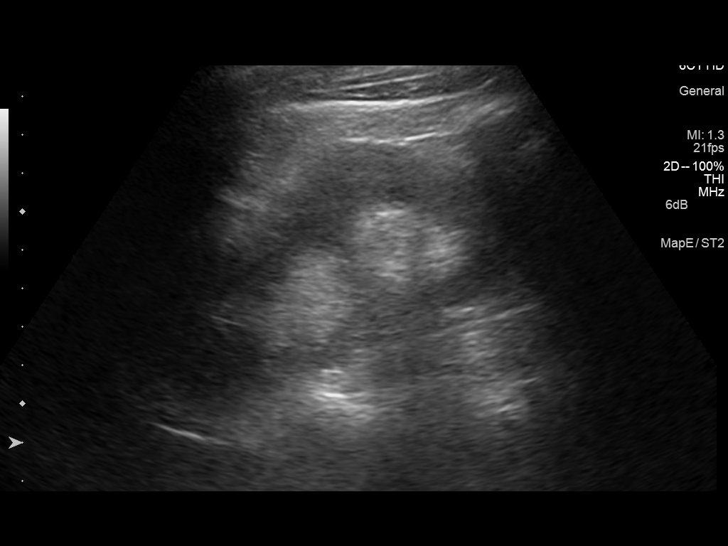
[im 12/27]
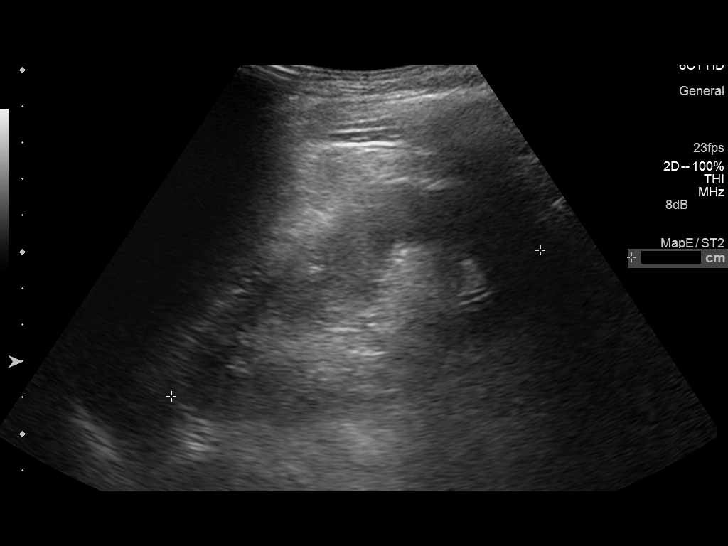
[im 15/27]
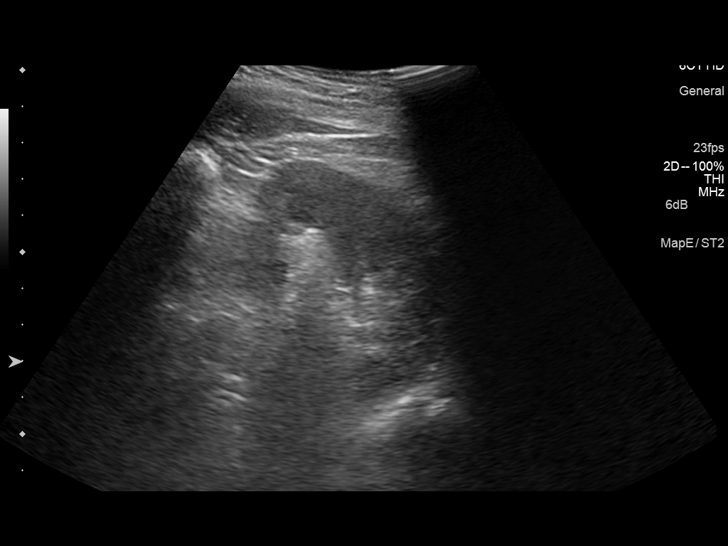
[im 17/27]
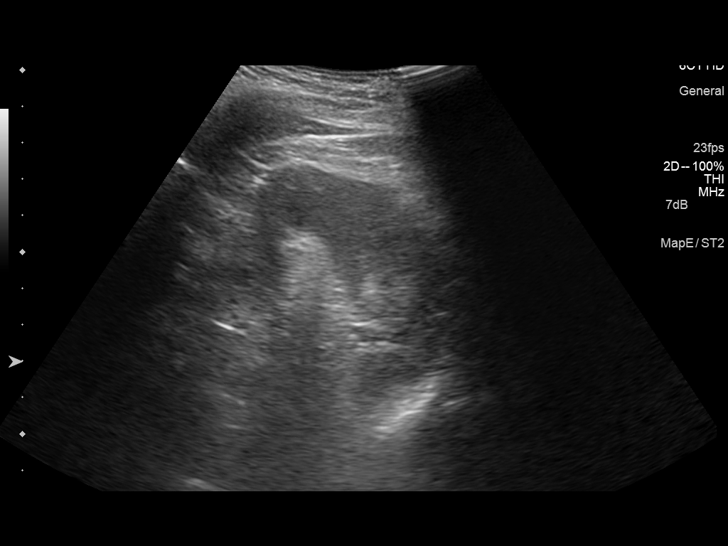
[im 18/27]
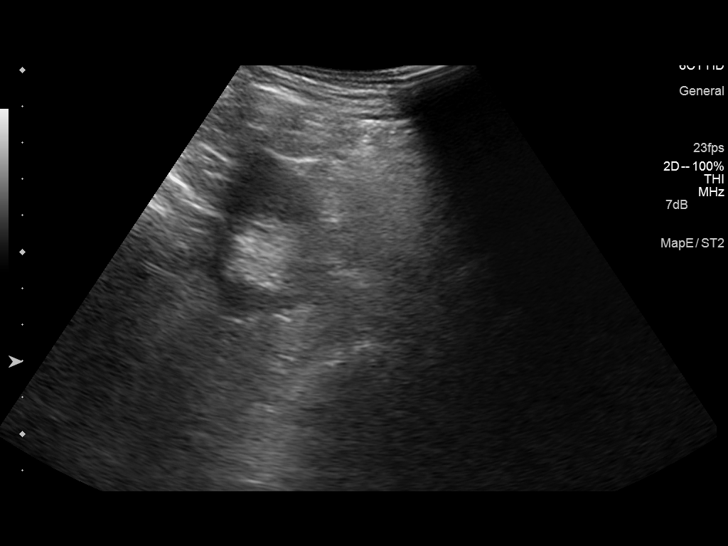
[im 20/27]
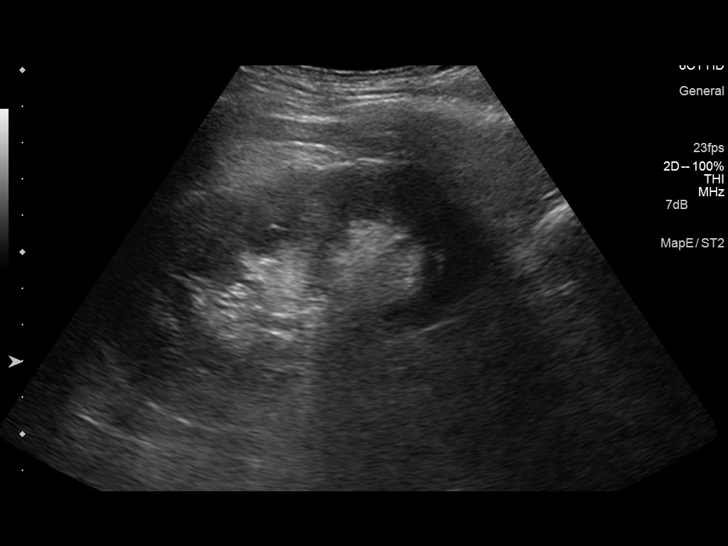
[im 22/27]
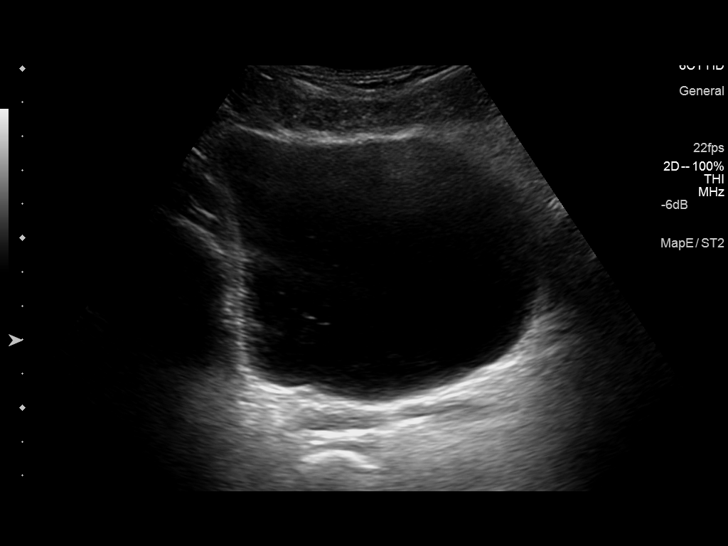
[im 24/27]
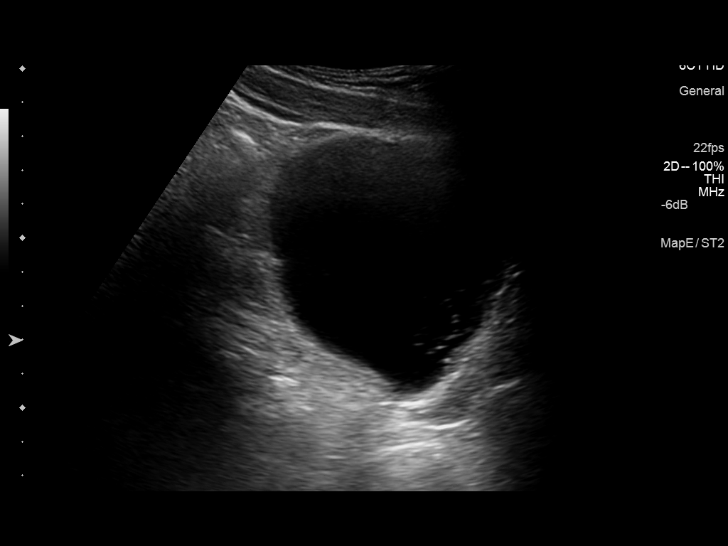
[im 27/27]
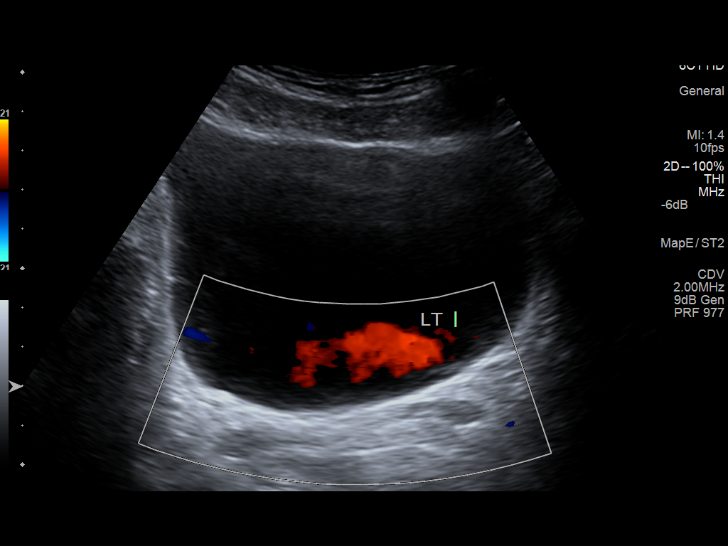

[14 of 25 positions shown; findings below may reference images not displayed]

FINDINGS: Right Kidney:

Length: 11.1 cm. Normal renal cortical thickness and echogenicity
without focal lesions or hydronephrosis.

Left Kidney:

Length: 10.9 cm. Normal renal cortical thickness and echogenicity
without focal lesions or hydronephrosis.

Bladder:

Normal.  Bilateral ureteral jets are documented.
IMPRESSION: Normal renal ultrasound examination.

## 2016-01-16 ENCOUNTER — Other Ambulatory Visit: Payer: Self-pay | Admitting: Neurology

## 2016-01-17 ENCOUNTER — Other Ambulatory Visit: Payer: Self-pay | Admitting: Neurology

## 2016-02-17 ENCOUNTER — Other Ambulatory Visit: Payer: Self-pay | Admitting: Neurology

## 2016-02-18 NOTE — Telephone Encounter (Signed)
RX sent to pharmacy  

## 2016-04-08 ENCOUNTER — Telehealth: Payer: Self-pay | Admitting: Neurology

## 2016-04-08 NOTE — Telephone Encounter (Signed)
Please see message from front desk. Attempted to reach family to gather more information x 2 with no answer.

## 2016-04-08 NOTE — Telephone Encounter (Signed)
He has known body jerking (myoclonus). Is this different?   He has clonazepam when he gets the jerking.    If this different?

## 2016-04-08 NOTE — Telephone Encounter (Signed)
Patient mother Harriett Sineancy called and states that patient is jerking really bad and maybe having small seizures. Patient is not sleeping please call [phone]

## 2016-04-09 NOTE — Telephone Encounter (Signed)
Same movement, mother states it is more constant.

## 2016-04-13 ENCOUNTER — Ambulatory Visit: Payer: Self-pay | Admitting: Neurology

## 2016-04-15 ENCOUNTER — Other Ambulatory Visit: Payer: Self-pay | Admitting: Neurology

## 2016-04-16 MED ORDER — LACOSAMIDE 50 MG PO TABS: 50.0000 mg | ORAL_TABLET | Freq: Two times a day (BID) | ORAL | 0 refills | Status: DC

## 2016-04-16 NOTE — Telephone Encounter (Signed)
RX sent for one refill. Per order do not exceed more than 4 refills before 05/23/16. Contacted pharmacy and patient has only got 3 refills on medication.

## 2016-04-22 ENCOUNTER — Ambulatory Visit: Payer: Self-pay | Admitting: Neurology

## 2016-05-07 NOTE — Telephone Encounter (Signed)
complete

## 2016-05-15 ENCOUNTER — Other Ambulatory Visit: Payer: Self-pay | Admitting: Neurology

## 2016-05-18 ENCOUNTER — Other Ambulatory Visit: Payer: Self-pay | Admitting: *Deleted

## 2016-05-18 MED ORDER — LACOSAMIDE 50 MG PO TABS: 50.0000 mg | ORAL_TABLET | Freq: Two times a day (BID) | ORAL | 2 refills | Status: DC

## 2016-05-18 MED ORDER — TOPIRAMATE 100 MG PO TABS: 100.0000 mg | ORAL_TABLET | Freq: Two times a day (BID) | ORAL | 2 refills | Status: DC

## 2016-05-18 NOTE — Telephone Encounter (Signed)
Rx sent with 2 refills.  

## 2016-08-07 ENCOUNTER — Other Ambulatory Visit: Payer: Self-pay | Admitting: Neurology

## 2016-08-09 ENCOUNTER — Telehealth: Payer: Self-pay | Admitting: Neurology

## 2016-08-09 NOTE — Telephone Encounter (Signed)
Received a call from PT's father regarding medication: Oxcarbazepine  Patient needs a refill of medication. Yes  Patient having side effects from medication. No  Patient calling to update [place] on medication. No

## 2016-08-10 MED ORDER — OXCARBAZEPINE 600 MG PO TABS: 600.0000 mg | ORAL_TABLET | Freq: Two times a day (BID) | ORAL | 0 refills | Status: DC

## 2016-08-10 NOTE — Telephone Encounter (Signed)
Spoke to father. Advised sent Rx to pharmacy confirmed on file. Advised would need to c/b and schedule appt also. Father verbalized understanding.

## 2016-08-15 ENCOUNTER — Other Ambulatory Visit: Payer: Self-pay | Admitting: Neurology

## 2016-08-16 NOTE — Telephone Encounter (Signed)
Patient father  wants to know why we would not fill medication and wants to talk to someone please call

## 2016-08-18 ENCOUNTER — Telehealth: Payer: Self-pay | Admitting: Neurology

## 2016-08-18 ENCOUNTER — Other Ambulatory Visit: Payer: Self-pay | Admitting: Neurology

## 2016-08-18 MED ORDER — LACOSAMIDE 50 MG PO TABS: 50.0000 mg | ORAL_TABLET | Freq: Two times a day (BID) | ORAL | 0 refills | Status: DC

## 2016-08-18 MED ORDER — OXCARBAZEPINE 600 MG PO TABS: 600.0000 mg | ORAL_TABLET | Freq: Two times a day (BID) | ORAL | 0 refills | Status: DC

## 2016-08-18 NOTE — Telephone Encounter (Signed)
Spoke with patient father and refilled vimpat and trileptal.  Patient has appointment scheduled.

## 2016-08-18 NOTE — Telephone Encounter (Signed)
Patient father aware rx is being filled at [facility].

## 2016-08-18 NOTE — Telephone Encounter (Signed)
PT called in regards to Vimpat and the pharmacy is saying the DR is not approving it/Dawn

## 2016-08-21 ENCOUNTER — Other Ambulatory Visit: Payer: Self-pay | Admitting: Neurology

## 2016-09-16 ENCOUNTER — Ambulatory Visit (INDEPENDENT_AMBULATORY_CARE_PROVIDER_SITE_OTHER): Payer: Medicaid Other | Admitting: Neurology

## 2016-09-16 ENCOUNTER — Encounter: Payer: Self-pay | Admitting: Neurology

## 2016-09-16 VITALS — BP 80/60 | HR 80 | Wt 123.0 lb

## 2016-09-16 DIAGNOSIS — R4701 Aphasia: Secondary | ICD-10-CM

## 2016-09-16 DIAGNOSIS — G40209 Localization-related (focal) (partial) symptomatic epilepsy and epileptic syndromes with complex partial seizures, not intractable, without status epilepticus: Secondary | ICD-10-CM | POA: Diagnosis not present

## 2016-09-16 DIAGNOSIS — Z720 Tobacco use: Secondary | ICD-10-CM | POA: Diagnosis not present

## 2016-09-16 DIAGNOSIS — S098XXA Other specified injuries of head, initial encounter: Secondary | ICD-10-CM | POA: Diagnosis not present

## 2016-09-16 NOTE — Patient Instructions (Signed)
1.  Continue oxcarbazepine 600mg  twice daily and topiramate 100mg  twice daily 2.  Continue clonazepam 3.  Follow up in 8 months.

## 2016-09-16 NOTE — Progress Notes (Addendum)
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  He is accompanied by his father who provides some history.  Labs reviewed.   UPDATE: No recent seizures since last visit.  No shaking spells. Trileptal 600mg  twice daily. Topamax 100mg  twice daily Vimpat 50mg  twice daily (ADDENDUM) He was takes clonazepam 0.5mg  for severe episodes of tremor.  06/11/16 Labs: CMP with Na 139, K 3.8, Cl 104, glucose 90, BUN 25, Cr 1.67, total bili 0.4, ALP 122, AST 17, ALT 11.   HISTORY: On 08/12/13, he had a seizure while in a convenience store.  He fell, hitting his head on the pavement.  He was brought to the ED at [facility].  CT of the head showed a large left subdural hematoma with subarachnoid blood over the left convexity with a 12 mm left to right shift.  He underwent emergent craniotomy.  He was in an induced coma for 4-5 weeks.  His hospital course was complicated by development of a DVT secondary to PICC, requiring heparin drip, VDRF status post extubation, and pneumonia.  He is still taking Xarelto.  He had a G-tube placed for nutrition.  He followed up with an neurologist, Dr. Caswell Corwin.  He was placed on Zoloft for agitation and outbursts.  He presented to the ED at [facility] on 12/06/13 for new shaking episodes with extreme agitation and combativeness.  It seemed to have occurred after taking a dose of risperidone.  CT of the head from 12/06/13, showed post-surgical changes, with interval clearing of left extra-axial fluid collection, mild increase in ventricular size (left greater than right), hypodense areas involving the left cerebrum and no midline shift or hemorrhage.  He was found to have HCAP and sepsis and was treated with antibiotics.     He resides  at [facility].  He has regained much more strength in the right upper extremity.  His mood is stable.  Recently, his PEG and trach was removed.  He has an unsteady gait but ambulates with other people around.  He has global aphasia.  He finished PT/OT and is currently still undergoing speech therapy.  He has had no further seizures.  He denies headache   He has a history of alcoholism.  Prior to the accident, he had two other seizures over a 5-6 year period.  It was attributed to his drinking.  He was never taking an antiepileptic medication.  He has a tenth grade education.  Prior to the accident, he had his own apartment.  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   Prior antiepileptic medications:  Depakote (tremor), Keppra (irritability), Lamictal (rash)  PAST MEDICAL HISTORY: Past Medical History:  Diagnosis Date  . Cirrhosis (HCC)   . Depression   . DVT (deep venous thrombosis) (HCC)    UE, LE DVT during hospitalization  . GERD (gastroesophageal reflux disease)   . H/O ETOH abuse   . H/O renal calculi   . Seizure (HCC)   . TBI (traumatic brain injury) (HCC)    07/2013, fall from standing post seizure    MEDICATIONS: Current Outpatient Prescriptions on File Prior to Visit  Medication Sig Dispense Refill  . amantadine (SYMMETREL) 100 MG capsule Take 100 mg by mouth 2 (two) times daily.    Marland Kitchen  bromocriptine (PARLODEL) 2.5 MG tablet Take 5 mg by mouth 2 (two) times daily.    . clonazePAM (KLONOPIN) 0.5 MG tablet Take 1 tablet (0.5 mg total) by mouth every 12 (twelve) hours as needed for anxiety (anxiety).    . clonazePAM (KLONOPIN) 0.5 MG tablet Take 0.5 mg by mouth every 12 (twelve) hours as needed for anxiety.    . docusate sodium (COLACE) 100 MG capsule Take 100 mg by mouth 2 (two) times daily. Reported on 07/23/2015    . ENSURE PLUS (ENSURE PLUS) LIQD Take 237 mLs by mouth 2 (two) times daily between meals.    Marland Kitchen escitalopram (LEXAPRO) 10 MG  tablet Take 10 mg by mouth daily.    . furosemide (LASIX) 20 MG tablet Take 20 mg by mouth daily.    . furosemide (LASIX) 40 MG tablet Take 20 mg by mouth daily.   0  . lacosamide (VIMPAT) 50 MG TABS tablet Take 1 tablet (50 mg total) by mouth 2 (two) times daily. 60 tablet 0  . Linaclotide (LINZESS) 145 MCG CAPS capsule Take 145 mcg by mouth daily.    . metoprolol tartrate (LOPRESSOR) 25 MG tablet Take 12.5 mg by mouth 2 (two) times daily.    . ondansetron (ZOFRAN) 4 MG tablet Take 1 tablet (4 mg total) by mouth every 6 (six) hours. (Patient taking differently: Take 4 mg by mouth every 8 (eight) hours as needed for nausea or vomiting. ) 12 tablet 0  . oxcarbazepine (TRILEPTAL) 600 MG tablet Take 1 tablet (600 mg total) by mouth 2 (two) times daily. 60 tablet 0  . pantoprazole (PROTONIX) 40 MG tablet Take 40 mg by mouth daily as needed (for heartburn).     . polyethylene glycol (MIRALAX / GLYCOLAX) packet Take 17 g by mouth daily as needed for mild constipation.    . QUEtiapine (SEROQUEL) 100 MG tablet Take 100 mg by mouth at bedtime.    Marland Kitchen spironolactone (ALDACTONE) 50 MG tablet Take 50 mg by mouth daily.    Marland Kitchen sulfamethoxazole-trimethoprim (BACTRIM DS,SEPTRA DS) 800-160 MG tablet Take 1 tablet by mouth 2 (two) times daily. ABT Start Date 07/23/15 & End Date 08/01/15.    . topiramate (TOPAMAX) 100 MG tablet TAKE 1 TABLET (100 MG TOTAL) BY MOUTH 2 (TWO) TIMES DAILY. 60 tablet 0  . [DISCONTINUED] divalproex (DEPAKOTE ER) 500 MG 24 hr tablet Take 2 tablets (1,000 mg total) by mouth 2 (two) times daily.    . [DISCONTINUED] LamoTRIgine XR 200 MG TB24 Take 1 tablet (200 mg total) by mouth daily. 30 tablet 1   No current facility-administered medications on file prior to visit.     ALLERGIES: Allergies  Allergen Reactions  . Ativan [Lorazepam] Other (See Comments)    Pt has paradoxical effect with sedating medications.   . Lactulose Hives    FAMILY HISTORY: Family History  Problem Relation Age of  Onset  . Hypercholesterolemia Mother   . Hypertension Mother   . Congestive Heart Failure Brother        Deceased age 46    SOCIAL HISTORY: Social History   Social History  . Marital status: Single    Spouse name: N/A  . Number of children: N/A  . Years of education: N/A   Occupational History  . Not on file.   Social History Main Topics  . Smoking status: Current Some Day Smoker    Packs/day: 0.25    Years: 20.00  . Smokeless tobacco: Never Used  . Alcohol use  0.0 oz/week  . Drug use: No  . Sexual activity: Not on file   Other Topics Concern  . Not on file   Social History Narrative  . No narrative on file    REVIEW OF SYSTEMS: Constitutional: No fevers, chills, or sweats, no generalized fatigue, change in appetite Eyes: No visual changes, double vision, eye pain Ear, nose and throat: No hearing loss, ear pain, nasal congestion, sore throat Cardiovascular: No chest pain, palpitations Respiratory:  No shortness of breath at rest or with exertion, wheezes GastrointestinaI: No nausea, vomiting, diarrhea, abdominal pain, fecal incontinence Genitourinary:  No dysuria, urinary retention or frequency Musculoskeletal:  No neck pain, back pain Integumentary: No rash, pruritus, skin lesions Neurological: as above Psychiatric: No depression, insomnia, anxiety Endocrine: No palpitations, fatigue, diaphoresis, mood swings, change in appetite, change in weight, increased thirst Hematologic/Lymphatic:  No purpura, petechiae. Allergic/Immunologic: no itchy/runny eyes, nasal congestion, recent allergic reactions, rashes  PHYSICAL EXAM: Vitals:   09/16/16 1121  BP: (!) 80/60  Pulse: 80   General: No acute distress.  Patient appears well-groomed.  normal body habitus. Head:  Normocephalic/atraumatic Eyes:  Fundi examined but not visualized Neck: supple, no paraspinal tenderness, full range of motion Heart:  Regular rate and rhythm Lungs:  Clear to auscultation  bilaterally Back: No paraspinal tenderness Neurological Exam: alert, unable to assess orientation, recent and remote memory, fund of knowledge, attention and concentration. Speech fluent and not dysarthric, but unintelligible.  Expressive more than receptive aphasia.  Unable to name or repeat.  Follows some simple commands but not others.  Unable to write.  Right pupil 0.265mm larger than left pupil but both round and reactive to light.  Unable to assess CN V.  Blinks to threat bilaterally.  Otherwise, CN II-XII intact.  mild increased tone in right upper extremity, no fasciculations.  Motor 5-/5 on the right upper and lower extremities.  5/5 left side.  Unable to assess sensation.  DTR 3+ right upper and lower extremities.  2+ on left.  Trace postural and kinetic tremor of upper extremities.  Wide-based gait.   IMPRESSION: 1.  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  Elevated serum Cr.  Fairly stable over the past 2 years (1.50s to 1.60s).  Being monitored by his other physicians. 4.  Tobacco abuse  PLAN: 1.  Continue oxcarbazepine 600mg  twice daily and topiramate 100mg  twice daily (ADDENDUM: as well as Vimpat 50mg  twice daily) 2.  Continue clonazepam for agitation and shaking spells/tremor 3.  Smoking cessation discussed. 4.  Monitor creatinine with PCP.  Keep hydrated. 5.  Follow up in 8 months.  17 minutes spent face to face with patient, over 50% spent discussing medication, labs, smoking cessation.  Shon MilletAdam Yanett Conkright, DO  CC:  Darin EngelsMelvin Morgan II, MD

## 2016-09-17 ENCOUNTER — Other Ambulatory Visit: Payer: Self-pay | Admitting: Neurology

## 2016-09-17 NOTE — Telephone Encounter (Signed)
Last office note states:  1.  Continue oxcarbazepine 600mg  twice daily and topiramate 100mg  twice daily 2.  Continue clonazepam for agitation and shaking spells/tremor  It does not mention this drug. Please advise.

## 2016-09-17 NOTE — Telephone Encounter (Signed)
Agree, I do not see any mention of this but there was prescription sent in April.  I will refill for 30-day supply and will ask Dr. Everlena CooperJaffe to clarify whether patient needs to be on this medication longterm.  Ledell Codrington K. Allena KatzPatel, DO

## 2016-09-21 ENCOUNTER — Other Ambulatory Visit: Payer: Self-pay | Admitting: Neurology

## 2016-09-22 ENCOUNTER — Telehealth: Payer: Self-pay | Admitting: Neurology

## 2016-09-22 NOTE — Telephone Encounter (Signed)
-----   Message from Drema DallasAdam R Jaffe, DO sent at 09/22/2016  7:41 AM EDT ----- Could we contact patient's father (or mother) and find out if Hector MaduroRobert has been taking the Vimpat in addition to Topamax and oxcarbazepine?  Thank you.

## 2016-09-22 NOTE — Telephone Encounter (Signed)
Spoke with patient's father and verified he is taking the Vimpat in addition to the Topamax and Oxcarbazepine.

## 2016-09-22 NOTE — Telephone Encounter (Signed)
Thank you for verifying.  We can provide refills if needed.

## 2016-10-14 ENCOUNTER — Ambulatory Visit: Payer: Self-pay | Admitting: Neurology

## 2016-10-14 ENCOUNTER — Other Ambulatory Visit: Payer: Self-pay | Admitting: Neurology

## 2016-10-17 ENCOUNTER — Other Ambulatory Visit: Payer: Self-pay | Admitting: Neurology

## 2016-10-18 ENCOUNTER — Other Ambulatory Visit: Payer: Self-pay | Admitting: *Deleted

## 2016-10-18 MED ORDER — LACOSAMIDE 50 MG PO TABS: 50.0000 mg | ORAL_TABLET | Freq: Two times a day (BID) | ORAL | 5 refills | Status: DC

## 2016-12-23 ENCOUNTER — Emergency Department (HOSPITAL_COMMUNITY)
Admission: EM | Admit: 2016-12-23 | Discharge: 2016-12-23 | Disposition: A | Discharge status: Home / Self Care | Payer: Medicaid Other | Attending: Emergency Medicine | Admitting: Emergency Medicine

## 2016-12-23 ENCOUNTER — Telehealth: Payer: Self-pay | Admitting: Neurology

## 2016-12-23 ENCOUNTER — Encounter (HOSPITAL_COMMUNITY): Payer: Self-pay | Admitting: Emergency Medicine

## 2016-12-23 DIAGNOSIS — Z5321 Procedure and treatment not carried out due to patient leaving prior to being seen by health care provider: Secondary | ICD-10-CM | POA: Insufficient documentation

## 2016-12-23 DIAGNOSIS — R5383 Other fatigue: Secondary | ICD-10-CM | POA: Insufficient documentation

## 2016-12-23 NOTE — Telephone Encounter (Signed)
[facility] He needs a refill on Bromocriptine. He said the pharmacy is needing a prior Authorization. His dad feels he may need a stronger dosage. He said he is still shaking. Please Advise. Thanks

## 2016-12-23 NOTE — ED Notes (Signed)
Per registration.  Pt left  

## 2016-12-23 NOTE — ED Triage Notes (Signed)
Patient brought by family and reports patient very weak and not eating.  Patient supposed to have a swallow test tomorrow. Patient having issues with abdomin but denies vomiting/diarrhea or urination.

## 2016-12-24 NOTE — Telephone Encounter (Signed)
Called and spoke with son, Konrad DoloresLester. He said another Dr Rx'd this and to disregard message, he has contacted the correct provider and they are handling the rqst.

## 2016-12-30 ENCOUNTER — Inpatient Hospital Stay (HOSPITAL_COMMUNITY)
Admission: EM | Admit: 2016-12-30 | Discharge: 2017-01-01 | DRG: 682 | Disposition: A | Discharge status: Home / Self Care | Payer: Medicaid Other | Attending: Internal Medicine | Admitting: Internal Medicine

## 2016-12-30 ENCOUNTER — Encounter (HOSPITAL_COMMUNITY): Payer: Self-pay

## 2016-12-30 ENCOUNTER — Observation Stay (HOSPITAL_COMMUNITY): Payer: Medicaid Other

## 2016-12-30 DIAGNOSIS — I951 Orthostatic hypotension: Secondary | ICD-10-CM | POA: Diagnosis present

## 2016-12-30 DIAGNOSIS — R1110 Vomiting, unspecified: Secondary | ICD-10-CM | POA: Insufficient documentation

## 2016-12-30 DIAGNOSIS — Z681 Body mass index (BMI) 19 or less, adult: Secondary | ICD-10-CM

## 2016-12-30 DIAGNOSIS — R627 Adult failure to thrive: Secondary | ICD-10-CM | POA: Diagnosis not present

## 2016-12-30 DIAGNOSIS — Z86718 Personal history of other venous thrombosis and embolism: Secondary | ICD-10-CM

## 2016-12-30 DIAGNOSIS — E861 Hypovolemia: Secondary | ICD-10-CM | POA: Diagnosis present

## 2016-12-30 DIAGNOSIS — N189 Chronic kidney disease, unspecified: Secondary | ICD-10-CM | POA: Diagnosis not present

## 2016-12-30 DIAGNOSIS — R112 Nausea with vomiting, unspecified: Secondary | ICD-10-CM | POA: Diagnosis not present

## 2016-12-30 DIAGNOSIS — E43 Unspecified severe protein-calorie malnutrition: Secondary | ICD-10-CM | POA: Diagnosis present

## 2016-12-30 DIAGNOSIS — E860 Dehydration: Secondary | ICD-10-CM | POA: Diagnosis not present

## 2016-12-30 DIAGNOSIS — E876 Hypokalemia: Secondary | ICD-10-CM | POA: Diagnosis present

## 2016-12-30 DIAGNOSIS — R1312 Dysphagia, oropharyngeal phase: Secondary | ICD-10-CM | POA: Diagnosis present

## 2016-12-30 DIAGNOSIS — Z8349 Family history of other endocrine, nutritional and metabolic diseases: Secondary | ICD-10-CM

## 2016-12-30 DIAGNOSIS — Z888 Allergy status to other drugs, medicaments and biological substances status: Secondary | ICD-10-CM

## 2016-12-30 DIAGNOSIS — F17210 Nicotine dependence, cigarettes, uncomplicated: Secondary | ICD-10-CM | POA: Diagnosis present

## 2016-12-30 DIAGNOSIS — N179 Acute kidney failure, unspecified: Secondary | ICD-10-CM | POA: Diagnosis present

## 2016-12-30 DIAGNOSIS — K219 Gastro-esophageal reflux disease without esophagitis: Secondary | ICD-10-CM | POA: Diagnosis present

## 2016-12-30 DIAGNOSIS — G40909 Epilepsy, unspecified, not intractable, without status epilepticus: Secondary | ICD-10-CM | POA: Diagnosis present

## 2016-12-30 DIAGNOSIS — D696 Thrombocytopenia, unspecified: Secondary | ICD-10-CM | POA: Diagnosis present

## 2016-12-30 DIAGNOSIS — Z87442 Personal history of urinary calculi: Secondary | ICD-10-CM

## 2016-12-30 DIAGNOSIS — N183 Chronic kidney disease, stage 3 (moderate): Secondary | ICD-10-CM | POA: Diagnosis present

## 2016-12-30 DIAGNOSIS — Z8249 Family history of ischemic heart disease and other diseases of the circulatory system: Secondary | ICD-10-CM

## 2016-12-30 DIAGNOSIS — Z87820 Personal history of traumatic brain injury: Secondary | ICD-10-CM

## 2016-12-30 LAB — URINALYSIS, ROUTINE W REFLEX MICROSCOPIC
Bilirubin Urine: NEGATIVE
Glucose, UA: NEGATIVE mg/dL
Hgb urine dipstick: NEGATIVE
Ketones, ur: NEGATIVE mg/dL
Leukocytes, UA: NEGATIVE
Nitrite: NEGATIVE
Protein, ur: NEGATIVE mg/dL
Specific Gravity, Urine: 1.005 (ref 1.005–1.030)
pH: 6 (ref 5.0–8.0)

## 2016-12-30 LAB — COMPREHENSIVE METABOLIC PANEL
ALT: 12 U/L — ABNORMAL LOW (ref 17–63)
AST: 22 U/L (ref 15–41)
Albumin: 3.9 g/dL (ref 3.5–5.0)
Alkaline Phosphatase: 101 U/L (ref 38–126)
Anion gap: 12 (ref 5–15)
BUN: 22 mg/dL — ABNORMAL HIGH (ref 6–20)
CO2: 28 mmol/L (ref 22–32)
Calcium: 8.6 mg/dL — ABNORMAL LOW (ref 8.9–10.3)
Chloride: 97 mmol/L — ABNORMAL LOW (ref 101–111)
Creatinine, Ser: 1.97 mg/dL — ABNORMAL HIGH (ref 0.61–1.24)
GFR calc Af Amer: 45 mL/min — ABNORMAL LOW (ref >60)
GFR calc non Af Amer: 38 mL/min — ABNORMAL LOW (ref >60)
Glucose, Bld: 137 mg/dL — ABNORMAL HIGH (ref 65–99)
Potassium: <2 mmol/L — CL (ref 3.5–5.1)
Sodium: 137 mmol/L (ref 135–145)
Total Bilirubin: 1 mg/dL (ref 0.3–1.2)
Total Protein: 7.1 g/dL (ref 6.5–8.1)

## 2016-12-30 LAB — CBC
HCT: 38 % — ABNORMAL LOW (ref 39.0–52.0)
Hemoglobin: 13.7 g/dL (ref 13.0–17.0)
MCH: 34 pg (ref 26.0–34.0)
MCHC: 36.1 g/dL — ABNORMAL HIGH (ref 30.0–36.0)
MCV: 94.3 fL (ref 78.0–100.0)
Platelets: 112 10*3/uL — ABNORMAL LOW (ref 150–400)
RBC: 4.03 MIL/uL — ABNORMAL LOW (ref 4.22–5.81)
RDW: 13.4 % (ref 11.5–15.5)
WBC: 6.7 10*3/uL (ref 4.0–10.5)

## 2016-12-30 LAB — BASIC METABOLIC PANEL
Anion gap: 8 (ref 5–15)
BUN: 18 mg/dL (ref 6–20)
CALCIUM: 8.7 mg/dL — AB (ref 8.9–10.3)
CO2: 27 mmol/L (ref 22–32)
CREATININE: 1.76 mg/dL — AB (ref 0.61–1.24)
Chloride: 107 mmol/L (ref 101–111)
GFR, EST AFRICAN AMERICAN: 51 mL/min — AB (ref >60)
GFR, EST NON AFRICAN AMERICAN: 44 mL/min — AB (ref >60)
GLUCOSE: 116 mg/dL — AB (ref 65–99)
POTASSIUM: 3.8 mmol/L (ref 3.5–5.1)
Sodium: 142 mmol/L (ref 135–145)

## 2016-12-30 LAB — LIPASE, BLOOD: Lipase: 56 U/L — ABNORMAL HIGH (ref 11–51)

## 2016-12-30 LAB — MAGNESIUM: Magnesium: 1.6 mg/dL — ABNORMAL LOW (ref 1.7–2.4)

## 2016-12-30 MED ORDER — QUETIAPINE FUMARATE 100 MG PO TABS
100.0000 mg | ORAL_TABLET | ORAL | Status: DC
  Administered 2016-12-31 – 2017-01-01 (×4): 100 mg via ORAL
  Filled 2016-12-30 (×4): qty 1

## 2016-12-30 MED ORDER — POTASSIUM CHLORIDE 10 MEQ/100ML IV SOLN
10.0000 meq | INTRAVENOUS | Status: AC
  Administered 2016-12-30 (×2): 10 meq via INTRAVENOUS
  Filled 2016-12-30 (×2): qty 100

## 2016-12-30 MED ORDER — AMANTADINE HCL 100 MG PO CAPS
100.0000 mg | ORAL_CAPSULE | Freq: Two times a day (BID) | ORAL | Status: DC
  Administered 2016-12-30 – 2017-01-01 (×4): 100 mg via ORAL
  Filled 2016-12-30 (×4): qty 1

## 2016-12-30 MED ORDER — VALPROATE SODIUM 500 MG/5ML IV SOLN
500.0000 mg | Freq: Two times a day (BID) | INTRAVENOUS | Status: DC
Start: 2016-12-30 — End: 2017-01-01
  Administered 2016-12-30 – 2017-01-01 (×4): 500 mg via INTRAVENOUS
  Filled 2016-12-30 (×4): qty 5

## 2016-12-30 MED ORDER — LINACLOTIDE 145 MCG PO CAPS
290.0000 ug | ORAL_CAPSULE | Freq: Every day | ORAL | Status: DC
  Administered 2016-12-31 – 2017-01-01 (×2): 290 ug via ORAL
  Filled 2016-12-30 (×2): qty 2

## 2016-12-30 MED ORDER — ESCITALOPRAM OXALATE 10 MG PO TABS
10.0000 mg | ORAL_TABLET | Freq: Every day | ORAL | Status: DC
  Administered 2016-12-31 – 2017-01-01 (×2): 10 mg via ORAL
  Filled 2016-12-30 (×2): qty 1

## 2016-12-30 MED ORDER — ONDANSETRON HCL 4 MG PO TABS: 4.0000 mg | ORAL_TABLET | Freq: Four times a day (QID) | ORAL | Status: DC | PRN

## 2016-12-30 MED ORDER — ONDANSETRON HCL 4 MG/2ML IJ SOLN: 4.0000 mg | Freq: Four times a day (QID) | INTRAMUSCULAR | Status: DC | PRN

## 2016-12-30 MED ORDER — BROMOCRIPTINE MESYLATE 2.5 MG PO TABS
5.0000 mg | ORAL_TABLET | Freq: Two times a day (BID) | ORAL | Status: DC
  Administered 2016-12-30 – 2017-01-01 (×4): 5 mg via ORAL
  Filled 2016-12-30 (×4): qty 2

## 2016-12-30 MED ORDER — TOPIRAMATE 100 MG PO TABS
100.0000 mg | ORAL_TABLET | Freq: Two times a day (BID) | ORAL | Status: DC
  Administered 2016-12-30 – 2017-01-01 (×4): 100 mg via ORAL
  Filled 2016-12-30 (×4): qty 1

## 2016-12-30 MED ORDER — ACETAMINOPHEN 325 MG PO TABS: 650.0000 mg | ORAL_TABLET | Freq: Four times a day (QID) | ORAL | Status: DC | PRN

## 2016-12-30 MED ORDER — OXCARBAZEPINE 300 MG PO TABS
600.0000 mg | ORAL_TABLET | Freq: Two times a day (BID) | ORAL | Status: DC
  Administered 2016-12-30 – 2017-01-01 (×4): 600 mg via ORAL
  Filled 2016-12-30 (×4): qty 2

## 2016-12-30 MED ORDER — SODIUM CHLORIDE 0.9 % IV BOLUS (SEPSIS)
1000.0000 mL | Freq: Once | INTRAVENOUS | Status: AC
  Administered 2016-12-30: 1000 mL via INTRAVENOUS

## 2016-12-30 MED ORDER — PANTOPRAZOLE SODIUM 40 MG IV SOLR
40.0000 mg | INTRAVENOUS | Status: DC
  Administered 2016-12-30 – 2016-12-31 (×2): 40 mg via INTRAVENOUS
  Filled 2016-12-30 (×2): qty 40

## 2016-12-30 MED ORDER — ONDANSETRON HCL 4 MG/2ML IJ SOLN
4.0000 mg | Freq: Once | INTRAMUSCULAR | Status: AC | PRN
  Administered 2016-12-30: 4 mg via INTRAVENOUS
  Filled 2016-12-30: qty 2

## 2016-12-30 MED ORDER — SODIUM CHLORIDE 0.9 % IV SOLN
50.0000 mg | Freq: Two times a day (BID) | INTRAVENOUS | Status: DC
  Administered 2016-12-30 – 2016-12-31 (×3): 50 mg via INTRAVENOUS
  Filled 2016-12-30 (×4): qty 5

## 2016-12-30 MED ORDER — METOPROLOL TARTRATE 25 MG PO TABS: 12.5000 mg | ORAL_TABLET | Freq: Two times a day (BID) | ORAL | Status: DC

## 2016-12-30 MED ORDER — MAGNESIUM SULFATE 2 GM/50ML IV SOLN
2.0000 g | Freq: Once | INTRAVENOUS | Status: AC
  Administered 2016-12-30: 2 g via INTRAVENOUS
  Filled 2016-12-30: qty 50

## 2016-12-30 MED ORDER — POTASSIUM CHLORIDE IN NACL 40-0.9 MEQ/L-% IV SOLN
INTRAVENOUS | Status: DC
  Administered 2016-12-30: 100 mL/h via INTRAVENOUS
  Administered 2016-12-31 – 2017-01-01 (×3): 125 mL/h via INTRAVENOUS
  Filled 2016-12-30 (×5): qty 1000

## 2016-12-30 MED ORDER — QUETIAPINE FUMARATE 100 MG PO TABS
200.0000 mg | ORAL_TABLET | Freq: Every day | ORAL | Status: DC
  Administered 2016-12-30 – 2016-12-31 (×2): 200 mg via ORAL
  Filled 2016-12-30 (×2): qty 2

## 2016-12-30 MED ORDER — POTASSIUM CHLORIDE 20 MEQ/15ML (10%) PO SOLN
40.0000 meq | Freq: Once | ORAL | Status: AC
  Administered 2016-12-30: 40 meq via ORAL
  Filled 2016-12-30: qty 30

## 2016-12-30 MED ORDER — ACETAMINOPHEN 650 MG RE SUPP: 650.0000 mg | Freq: Four times a day (QID) | RECTAL | Status: DC | PRN

## 2016-12-30 NOTE — ED Notes (Signed)
ED TO INPATIENT HANDOFF REPORT  Name/Age/Gender Hector [name] 46 y.o. male  Code Status    Code Status Orders        Start     Ordered   12/30/16 1439  Full code  Continuous     12/30/16 1439    Code Status History    Date Active Date Inactive Code Status Order ID Comments User Context   06/01/2015 11:51 PM 06/02/2015  4:01 PM Full Code [id number]  Gennaro Africa, MD ED   02/13/2015  2:07 AM 02/15/2015  5:20 PM Full Code [id number]  Toy Baker, MD Inpatient   06/25/2014 11:39 PM 06/27/2014  7:34 PM Full Code [id number]  Rise Patience, MD Inpatient   12/13/2013  8:56 AM 01/03/2014  3:54 PM Full Code [id number]  Toy Baker, MD Inpatient   12/12/2013 12:29 AM 12/13/2013  8:56 AM Full Code [id number]  [name], [name] ED      Home/SNF/Other Home  Chief Complaint Hypotension  Level of Care/Admitting Diagnosis ED Disposition    ED Disposition Condition Comment   Admit  Hospital Area: [facility] [[id number]]  Level of Care: Telemetry [5]  Admit to tele based on following criteria: Complex arrhythmia (Bradycardia/Tachycardia)  Diagnosis: Failure to thrive (0-17) [659935]  Admitting Physician: Rosita Fire [[id number]]  Attending Physician: Rosita Fire [[id number]]  PT Class (Do Not Modify): Observation [104]  PT Acc Code (Do Not Modify): Observation [10022]       Medical History Past Medical History:  Diagnosis Date  . Cirrhosis ([place])   . Depression   . DVT (deep venous thrombosis) ([facility])    UE, LE DVT during hospitalization  . GERD (gastroesophageal reflux disease)   . H/O ETOH abuse   . H/O renal calculi   . Seizure ([place])   . TBI (traumatic brain injury) ([place])    07/2013, fall from standing post seizure    Allergies Allergies  Allergen Reactions  . Ativan [Lorazepam] Other (See Comments)    Pt has paradoxical effect with sedating medications.   . Lactulose Hives    IV Location/Drains/Wounds Patient  Lines/Drains/Airways Status   Active Line/Drains/Airways    Name:   Placement date:   Placement time:   Site:   Days:   Peripheral IV 12/30/16 Left Antecubital  12/30/16        Antecubital    less than 1   Gastrostomy/Enterostomy PEG-jejunostomy LUQ          LUQ       Urethral Catheter Jordan Hawks  12/13/13    1500        1113          Labs/Imaging Results for orders placed or performed during the hospital encounter of 12/30/16 (from the past 48 hour(s))  Lipase, blood     Status: Abnormal   Collection Time: 12/30/16 11:52 AM  Result Value Ref Range   Lipase 56 (H) 11 - 51 U/L  Comprehensive metabolic panel     Status: Abnormal   Collection Time: 12/30/16 11:52 AM  Result Value Ref Range   Sodium 137 135 - 145 mmol/L   Potassium <2.0 (LL) 3.5 - 5.1 mmol/L    Comment: CRITICAL RESULT CALLED TO, READ BACK BY AND VERIFIED WITH: M.QUICK RN 12/30/2016 1255 BY J.ROCK    Chloride 97 (L) 101 - 111 mmol/L   CO2 28 22 - 32 mmol/L   Glucose, Bld 137 (H) 65 - 99 mg/dL   BUN 22 (H) 6 -  20 mg/dL   Creatinine, Ser 1.97 (H) 0.61 - 1.24 mg/dL   Calcium 8.6 (L) 8.9 - 10.3 mg/dL   Total Protein 7.1 6.5 - 8.1 g/dL   Albumin 3.9 3.5 - 5.0 g/dL   AST 22 15 - 41 U/L   ALT 12 (L) 17 - 63 U/L   Alkaline Phosphatase 101 38 - 126 U/L   Total Bilirubin 1.0 0.3 - 1.2 mg/dL   GFR calc non Af Amer 38 (L) >60 mL/min   GFR calc Af Amer 45 (L) >60 mL/min    Comment: (NOTE) The eGFR has been calculated using the CKD EPI equation. This calculation has not been validated in all clinical situations. eGFR's persistently <60 mL/min signify possible Chronic Kidney Disease.    Anion gap 12 5 - 15  CBC     Status: Abnormal   Collection Time: 12/30/16 11:52 AM  Result Value Ref Range   WBC 6.7 4.0 - 10.5 K/uL   RBC 4.03 (L) 4.22 - 5.81 MIL/uL   Hemoglobin 13.7 13.0 - 17.0 g/dL   HCT 38.0 (L) 39.0 - 52.0 %   MCV 94.3 78.0 - 100.0 fL   MCH 34.0 26.0 - 34.0 pg   MCHC 36.1 (H) 30.0 - 36.0 g/dL   RDW 13.4  11.5 - 15.5 %   Platelets 112 (L) 150 - 400 K/uL    Comment: REPEATED TO VERIFY SPECIMEN CHECKED FOR CLOTS PLATELET COUNT CONFIRMED BY SMEAR   Magnesium     Status: Abnormal   Collection Time: 12/30/16 11:52 AM  Result Value Ref Range   Magnesium 1.6 (L) 1.7 - 2.4 mg/dL   No results found.  Pending Labs [employer]    Start     Ordered   12/31/16 2595  Basic metabolic panel  Tomorrow morning,   R     12/30/16 1443   12/31/16 0500  CBC  Tomorrow morning,   R     12/30/16 1517   12/30/16 6387  Basic metabolic panel  Once,   R     12/30/16 1443   12/30/16 1152  Urinalysis, Routine w reflex microscopic  STAT,   STAT     12/30/16 1152      Vitals/Pain Today's Vitals   12/30/16 1150 12/30/16 1200 12/30/16 1300 12/30/16 1400  BP:  101/71 102/71 93/75  Pulse:  77 75 78  Resp:  (!) 23 15 19   Temp:      TempSrc:      SpO2:  98% 100% 100%  Weight: 123 lb (55.8 kg)       Isolation Precautions No active isolations  Medications Medications  potassium chloride 10 mEq in 100 mL IVPB (10 mEq Intravenous New Bag/Given 12/30/16 1400)  magnesium sulfate IVPB 2 g 50 mL (not administered)  QUEtiapine (SEROQUEL) tablet 100-200 mg (not administered)  amantadine (SYMMETREL) capsule 100 mg (not administered)  escitalopram (LEXAPRO) tablet 10 mg (not administered)  metoprolol tartrate (LOPRESSOR) tablet 12.5 mg (not administered)  bromocriptine (PARLODEL) tablet 5 mg (not administered)  linaclotide (LINZESS) capsule 290 mcg (not administered)  topiramate (TOPAMAX) tablet 100 mg (not administered)  Oxcarbazepine (TRILEPTAL) tablet 600 mg (not administered)  0.9 % NaCl with KCl 40 mEq / L  infusion (not administered)  acetaminophen (TYLENOL) tablet 650 mg (not administered)    Or  acetaminophen (TYLENOL) suppository 650 mg (not administered)  ondansetron (ZOFRAN) tablet 4 mg (not administered)    Or  ondansetron (ZOFRAN) injection 4 mg (not administered)  valproate (DEPACON)  500 mg  in dextrose 5 % 50 mL IVPB (not administered)  lacosamide (VIMPAT) 50 mg in sodium chloride 0.9 % 25 mL IVPB (not administered)  ondansetron (ZOFRAN) injection 4 mg (4 mg Intravenous Given 12/30/16 1358)  sodium chloride 0.9 % bolus 1,000 mL (0 mLs Intravenous Stopped 12/30/16 1354)  potassium chloride 20 MEQ/15ML (10%) solution 40 mEq (40 mEq Oral Given 12/30/16 1353)    Mobility non-ambulatory

## 2016-12-30 NOTE — ED Triage Notes (Signed)
Per EMS, patient from home, family reports patient has had decreased appetite x2 weeks and 1 episode of vomiting this morning. Hypotensive with EMS. 92/50. Hx TBI. Difficult to understand.  18g L AC NS with EMS  BP 102/58 CBG 130

## 2016-12-30 NOTE — ED Notes (Signed)
At this time PT unable to provide urine sample. Will continue to attempt

## 2016-12-30 NOTE — ED Notes (Signed)
Bed: WA04 Expected date:  Expected time:  Means of arrival:  Comments: 46 yo m weakness

## 2016-12-30 NOTE — H&P (Addendum)
History and Physical    Hector NianRobert W Hinger ONG:[id number] DOB: 04/29/1970 DOA: 12/30/2016  PCP: [name], [name], MD  Patient coming from:Home  Chief Complaint: Decreased oral intake and failure to thrive  HPI: Hector [name] is a 46 y.o. male with medical history significant of traumatic brain injury, seizure disorder, chronic kidney disease, anxiety depression, history of gastrostomy tube placement for feeding which was removed later when patient started eating well brought in by patient's father for the evaluation of not eating well associated with generalized weakness, dehydration. Patient's father reported that about 2 weeks ago he was able to ambulate and eating reasonably okay. Since then he is gradually declining and not really eating or drinking much. His weakness is gradually worsened. Patient has speech difficulty. Unable to obtain history from the patient therefore I called patient's father who is the caretaker for the patient. Denied fever, chills. No abdominal pain. Patient had good bowel movement this morning as per patient's father.  ED Course: On arrival patient was hypotensive to 90s with severe dehydration. Lab consistent with potassium less than 2, serum creatinine level elevated to 1.9 from 1.5 a year ago. Patient has chronic thrombocytopenia with platelet of 112. Repleted potassium and magnesium. Admitted for further evaluation.  Review of Systems: As per HPI otherwise 10 point review of systems negative.    Past Medical History:  Diagnosis Date  . Cirrhosis ([facility])   . Depression   . DVT (deep venous thrombosis) ([facility])    UE, LE DVT during hospitalization  . GERD (gastroesophageal reflux disease)   . H/O ETOH abuse   . H/O renal calculi   . Seizure ([facility])   . TBI (traumatic brain injury) ([facility])    07/2013, fall from standing post seizure    Past Surgical History:  Procedure Laterality Date  . BRAIN SURGERY    . GASTROSTOMY TUBE PLACEMENT    . REMOVAL OF  GASTROSTOMY TUBE    . TRACHEOSTOMY     feinstein    Social history: reports that he has been smoking Cigarettes.  He has a 5.00 pack-year smoking history. He has never used smokeless tobacco. He reports that he drinks alcohol. He reports that he does not use drugs.  Allergies  Allergen Reactions  . Ativan [Lorazepam] Other (See Comments)    Pt has paradoxical effect with sedating medications.   . Lactulose Hives    Family History  Problem Relation Age of Onset  . Hypercholesterolemia Mother   . Hypertension Mother   . Congestive Heart Failure Brother        Deceased age 48     Prior to Admission medications   Medication Sig Start Date End Date Taking? Authorizing Provider  amantadine (SYMMETREL) 100 MG capsule Take 100 mg by mouth 2 (two) times daily.   Yes [provider]  bromocriptine (PARLODEL) 2.5 MG tablet Take 5 mg by mouth 2 (two) times daily.   Yes [provider]  clonazePAM (KLONOPIN) 1 MG tablet Take 1 mg by mouth at bedtime. 12/24/16  Yes [provider]  divalproex (DEPAKOTE) 250 MG DR tablet Take 500 mg by mouth 2 (two) times daily.   Yes [provider]  docusate sodium (COLACE) 100 MG capsule Take 100 mg by mouth 2 (two) times daily. Reported on 07/23/2015   Yes [provider]  ENSURE PLUS (ENSURE PLUS) LIQD Take 237 mLs by mouth 2 (two) times daily between meals.   Yes [provider]  escitalopram (LEXAPRO) 10  MG tablet Take 10 mg by mouth daily.   Yes [provider]  furosemide (LASIX) 40 MG tablet Take 20 mg by mouth daily.  07/02/15  Yes [provider]  lacosamide (VIMPAT) 50 MG TABS tablet Take 1 tablet (50 mg total) by mouth 2 (two) times daily. 10/18/16  Yes Jaffe, Adam R, DO  LINZESS 290 MCG CAPS capsule Take 290 mcg by mouth daily. 11/20/16  Yes [provider]  metoprolol tartrate (LOPRESSOR) 25 MG tablet Take 12.5 mg by mouth 2 (two) times daily.   Yes [provider]   ondansetron (ZOFRAN) 4 MG tablet Take 1 tablet (4 mg total) by mouth every 6 (six) hours. Patient taking differently: Take 4 mg by mouth every 8 (eight) hours as needed for nausea or vomiting.  12/10/13  Yes Junious Silk, PA-C  oxcarbazepine (TRILEPTAL) 600 MG tablet TAKE 1 TABLET BY MOUTH TWICE A DAY 10/14/16  Yes Jaffe, Adam R, DO  pantoprazole (PROTONIX) 40 MG tablet Take 40 mg by mouth daily as needed (for heartburn).    Yes [provider]  QUEtiapine (SEROQUEL) 100 MG tablet Take 100-200 mg by mouth 3 (three) times daily. Take  by mouth at 10am,  by mouth at 1400, and  by mouth at bedtime   Yes [provider]  spironolactone (ALDACTONE) 25 MG tablet Take 25 mg by mouth daily.    Yes [provider]  topiramate (TOPAMAX) 100 MG tablet TAKE 1 TABLET BY MOUTH TWICE A DAY 09/22/16  Yes Jaffe, Adam R, DO  clonazePAM (KLONOPIN) 0.5 MG tablet Take 1 tablet (0.5 mg total) by mouth every 12 (twelve) hours as needed for anxiety (anxiety). Patient not taking: Reported on 12/30/2016 02/15/15   Vassie Loll, MD    Physical Exam: Vitals:   12/30/16 1150 12/30/16 1200 12/30/16 1300 12/30/16 1400  BP:  101/71 102/71 93/75  Pulse:  77 75 78  Resp:  (!) Temp:      TempSrc:      SpO2:  98% 100% 100%  Weight: 55.8 kg (123 lb)         Constitutional: Thin cachectic male lying on bed with dry mucous membranes Vitals:   12/30/16 1150 12/30/16 1200 12/30/16 1300 12/30/16 1400  BP:  101/71 102/71 93/75  Pulse:  77 75 78  Resp:  (!) Temp:      TempSrc:      SpO2:  98% 100% 100%  Weight: 55.8 kg (123 lb)      Eyes: PERRL ENMT: Mucous membranes areDry .  Neck: normal, supple Respiratory: clear to auscultation bilaterally, no wheezing, no crackles. Normal respiratory effort. No accessory muscle use.  Cardiovascular: Regular rate and rhythm, S1S2 Normal. . No extremity edema.   Abdomen: Soft, Non-tender, non-distended. Bowel sounds  positive.  Musculoskeletal: no clubbing / cyanosis.  Skin: no rashes, lesions, ulcers. No induration Neurologic: Alert awake but incomprehensible speech  Psychiatric: Unable to assess    Labs on Admission: I have personally reviewed following labs and imaging studies  CBC:  Recent Labs Lab 12/30/16 1152  WBC 6.7  HGB 13.7  HCT 38.0*  MCV 94.3  PLT 112*   Basic Metabolic Panel:  Recent Labs Lab 12/30/16 1152  NA 137  K <2.0*  CL 97*  CO2 28  GLUCOSE 137*  BUN 22*  CREATININE 1.97*  CALCIUM 8.6*  MG 1.6*   GFR: CrCl cannot be calculated (Unknown ideal weight.). Liver Function Tests:  Recent Labs Lab 12/30/16 1152  AST 22  ALT 12*  ALKPHOS 101  BILITOT 1.0  PROT 7.1  ALBUMIN 3.9    Recent Labs Lab 12/30/16 1152  LIPASE 56*   No results for input(s): AMMONIA in the last 168 hours. Coagulation Profile: No results for input(s): INR, PROTIME in the last 168 hours. Cardiac Enzymes: No results for input(s): CKTOTAL, CKMB, CKMBINDEX, TROPONINI in the last 168 hours. BNP (last 3 results) No results for input(s): PROBNP in the last 8760 hours. HbA1C: No results for input(s): HGBA1C in the last 72 hours. CBG: No results for input(s): GLUCAP in the last 168 hours. Lipid Profile: No results for input(s): CHOL, HDL, LDLCALC, TRIG, CHOLHDL, LDLDIRECT in the last 72 hours. Thyroid Function Tests: No results for input(s): TSH, T4TOTAL, FREET4, T3FREE, THYROIDAB in the last 72 hours. Anemia Panel: No results for input(s): VITAMINB12, FOLATE, FERRITIN, TIBC, IRON, RETICCTPCT in the last 72 hours. Urine analysis:    Component Value Date/Time   COLORURINE YELLOW 06/01/2015 2123   APPEARANCEUR CLEAR 06/01/2015 2123   LABSPEC 1.015 06/01/2015 2123   PHURINE 6.0 06/01/2015 2123   GLUCOSEU NEGATIVE 06/01/2015 2123   HGBUR NEGATIVE 06/01/2015 2123   BILIRUBINUR NEGATIVE 06/01/2015 2123   KETONESUR NEGATIVE 06/01/2015 2123   PROTEINUR NEGATIVE 06/01/2015 2123    UROBILINOGEN 1.0 02/12/2015 1912   NITRITE NEGATIVE 06/01/2015 2123   LEUKOCYTESUR NEGATIVE 06/01/2015 2123   Sepsis Labs: !!!!!!!!!!!!!!!!!!!!!!!!!!!!!!!!!!!!!!!!!!!! (procalcitonin:4,lacticidven:4) )No results found for this or any previous visit (from the past 240 hour(s)).   Radiological Exams on Admission: No results found.  EKG: Independently reviewed. Sinus rhythm with prolonged QT.  Assessment/Plan Active Problems:   Dehydration   Failure to thrive (0-17)  # Failure to thrive, decreased oral intake, dehydration: -Patient with history of traumatic brain injury and chronic encephalopathy. He also has a history of seizure disorder. He has gradual decline in his oral intake. Physical exam with severe dehydration and hypokalemia. -Patient had a feeding tube placed in the past. -Dietary referral, speech and swallow evaluation, care management referral -Ordered dysphagia diet -Encourage oral intake. I have discussed with the patient's father who is also her caregiver per the patient that if patient has difficulty swallowing and has remained malnourished he may benefit from tube feed. He verbalized understanding.  # Generalized weakness likely due to dehydration and severe hypokalemia: Hypomagnesemia: -Repleted potassium chloride and magnesium in the ER. At potassium in IV fluid. Continue supportive care. Monitor electrolytes.  #Vomiting: Abdomen exam benign. Patient has mild elevation in lipase with soft and nontender abdomen. Repeat lipase in the morning. Patient is on multiple antiepileptic medications. Check abdominal x-ray.  #Acute kidney injury on chronic kidney disease stage III: Likely hemodynamically mediated. Checking UA. Monitor BMP. On IV fluid. Holding diuretics.  #Chronic thrombocytopenia: No sign of bleeding. Monitor platelets.  #Seizure disorder: Resume home medication. Change depakote and vimpat to IV from because of decreased oral intake.  #Hypotension  due to dehydration/hypovolemia: Hold Lasix, Aldactone and metoprolol. Monitor blood pressure and heart rate closely.  DVT prophylaxis: SCD Code Status: Full code Family Communication: Discussed with the patient's father over the phone Disposition Plan: Telemetry Consults called: None Admission status: Observation   Dron Jaynie Collins MD [facility] Pager [phone]  If 7PM-7AM, please contact night-coverage [email or web address] Password [email or web address]  12/30/2016, 3:17 PM

## 2016-12-31 DIAGNOSIS — E876 Hypokalemia: Secondary | ICD-10-CM | POA: Diagnosis not present

## 2016-12-31 DIAGNOSIS — Z888 Allergy status to other drugs, medicaments and biological substances status: Secondary | ICD-10-CM | POA: Diagnosis not present

## 2016-12-31 DIAGNOSIS — E861 Hypovolemia: Secondary | ICD-10-CM | POA: Diagnosis present

## 2016-12-31 DIAGNOSIS — N183 Chronic kidney disease, stage 3 (moderate): Secondary | ICD-10-CM

## 2016-12-31 DIAGNOSIS — R627 Adult failure to thrive: Secondary | ICD-10-CM

## 2016-12-31 DIAGNOSIS — Z681 Body mass index (BMI) 19 or less, adult: Secondary | ICD-10-CM | POA: Diagnosis not present

## 2016-12-31 DIAGNOSIS — E860 Dehydration: Secondary | ICD-10-CM | POA: Diagnosis not present

## 2016-12-31 DIAGNOSIS — Z87442 Personal history of urinary calculi: Secondary | ICD-10-CM | POA: Diagnosis not present

## 2016-12-31 DIAGNOSIS — F17210 Nicotine dependence, cigarettes, uncomplicated: Secondary | ICD-10-CM | POA: Diagnosis present

## 2016-12-31 DIAGNOSIS — I9589 Other hypotension: Secondary | ICD-10-CM

## 2016-12-31 DIAGNOSIS — N189 Chronic kidney disease, unspecified: Secondary | ICD-10-CM | POA: Diagnosis not present

## 2016-12-31 DIAGNOSIS — R1312 Dysphagia, oropharyngeal phase: Secondary | ICD-10-CM | POA: Diagnosis not present

## 2016-12-31 DIAGNOSIS — D696 Thrombocytopenia, unspecified: Secondary | ICD-10-CM | POA: Diagnosis present

## 2016-12-31 DIAGNOSIS — I951 Orthostatic hypotension: Secondary | ICD-10-CM | POA: Diagnosis not present

## 2016-12-31 DIAGNOSIS — Z8349 Family history of other endocrine, nutritional and metabolic diseases: Secondary | ICD-10-CM | POA: Diagnosis not present

## 2016-12-31 DIAGNOSIS — Z8249 Family history of ischemic heart disease and other diseases of the circulatory system: Secondary | ICD-10-CM | POA: Diagnosis not present

## 2016-12-31 DIAGNOSIS — Z86718 Personal history of other venous thrombosis and embolism: Secondary | ICD-10-CM | POA: Diagnosis not present

## 2016-12-31 DIAGNOSIS — N179 Acute kidney failure, unspecified: Secondary | ICD-10-CM | POA: Diagnosis not present

## 2016-12-31 DIAGNOSIS — E43 Unspecified severe protein-calorie malnutrition: Secondary | ICD-10-CM | POA: Diagnosis present

## 2016-12-31 DIAGNOSIS — G40909 Epilepsy, unspecified, not intractable, without status epilepticus: Secondary | ICD-10-CM | POA: Diagnosis present

## 2016-12-31 DIAGNOSIS — K219 Gastro-esophageal reflux disease without esophagitis: Secondary | ICD-10-CM | POA: Diagnosis present

## 2016-12-31 DIAGNOSIS — R531 Weakness: Secondary | ICD-10-CM | POA: Diagnosis present

## 2016-12-31 DIAGNOSIS — Z87820 Personal history of traumatic brain injury: Secondary | ICD-10-CM | POA: Diagnosis not present

## 2016-12-31 LAB — LIPASE, BLOOD: LIPASE: 48 U/L (ref 11–51)

## 2016-12-31 LAB — BASIC METABOLIC PANEL
ANION GAP: 6 (ref 5–15)
BUN: 14 mg/dL (ref 6–20)
CALCIUM: 8.6 mg/dL — AB (ref 8.9–10.3)
CO2: 29 mmol/L (ref 22–32)
Chloride: 110 mmol/L (ref 101–111)
Creatinine, Ser: 1.43 mg/dL — ABNORMAL HIGH (ref 0.61–1.24)
GFR calc Af Amer: >60 mL/min (ref >60)
GFR calc non Af Amer: 57 mL/min — ABNORMAL LOW (ref >60)
Glucose, Bld: 90 mg/dL (ref 65–99)
Potassium: 3.6 mmol/L (ref 3.5–5.1)
SODIUM: 145 mmol/L (ref 135–145)

## 2016-12-31 LAB — CBC
HEMATOCRIT: 33.2 % — AB (ref 39.0–52.0)
HEMOGLOBIN: 11.5 g/dL — AB (ref 13.0–17.0)
MCH: 34 pg (ref 26.0–34.0)
MCHC: 34.6 g/dL (ref 30.0–36.0)
MCV: 98.2 fL (ref 78.0–100.0)
Platelets: 98 10*3/uL — ABNORMAL LOW (ref 150–400)
RBC: 3.38 MIL/uL — ABNORMAL LOW (ref 4.22–5.81)
RDW: 14.1 % (ref 11.5–15.5)
WBC: 4.9 10*3/uL (ref 4.0–10.5)

## 2016-12-31 MED ORDER — ENSURE ENLIVE PO LIQD
237.0000 mL | Freq: Two times a day (BID) | ORAL | Status: DC
  Administered 2016-12-31 – 2017-01-01 (×2): 237 mL via ORAL

## 2016-12-31 MED ORDER — SODIUM CHLORIDE 0.9 % IV BOLUS (SEPSIS)
250.0000 mL | Freq: Once | INTRAVENOUS | Status: AC
  Administered 2016-12-31: 250 mL via INTRAVENOUS

## 2016-12-31 MED ORDER — SODIUM CHLORIDE 0.9 % IV BOLUS (SEPSIS)
1000.0000 mL | Freq: Once | INTRAVENOUS | Status: AC
  Administered 2016-12-31: 1000 mL via INTRAVENOUS

## 2016-12-31 NOTE — Care Management Note (Signed)
Case Management Note  Patient Details  Name: Hector NianRobert W [name] MRN: [id number] Date of Birth: 05-23-1970  Subjective/Objective:                   46 y.o. male with medical history significant of traumatic brain injury, seizure disorder, chronic kidney disease, anxiety depression, history of gastrostomy tube placement for feeding which was removed later when patient started eating well brought in by patient's father for the evaluation of not eating well associated with generalized weakness, dehydration. Patient's father reported that about 2 weeks ago he was able to ambulate and eating reasonably okay. Since then he is gradually declining and not really eating or drinking much. His weakness is gradually worsened. Patient has speech difficulty. Unable to obtain history from the patient therefore I called patient's father who is the caretaker for the patient. Denied fever, chills. No abdominal pain. Patient had good bowel movement this morning as per patient's father.  ED Course: On arrival patient was hypotensive to 90s with severe dehydration. Lab consistent with potassium less than 2, serum creatinine level elevated to 1.9 from 1.5 a year ago. Patient has chronic thrombocytopenia with platelet of 112. Repleted potassium and magnesium.  Action/Plan: Date:  December 31, 2016 Chart reviewed for concurrent status and case management needs. Will continue to follow patient progress. Discharge Planning: following for needs Expected discharge date: [id number] Marcelle SmilingRhonda Davis, BSN, [place]3, [place]   [phone]  Expected Discharge Date:   (unknown)               Expected Discharge Plan:  Home/Self Care  In-House Referral:     Discharge planning Services  CM Consult  Post Acute Care Choice:    Choice offered to:     DME Arranged:    DME Agency:     HH Arranged:    HH Agency:     Status of Service:  In process, will continue to follow  If discussed at Long Length of Stay Meetings, dates discussed:     Additional Comments:  Golda AcreDavis, Rhonda Lynn, RN 12/31/2016, 9:03 AM

## 2016-12-31 NOTE — Evaluation (Signed)
Physical Therapy Evaluation Patient Details Name: Hector [name] MRN: [id number] DOB: 09-01-1970 Today's Date: 12/31/2016   History of Present Illness  46 yo male admitted with FTT, weakness. Hx of TBI, ETOH abuse, Sz, DVT, cirrhosis, chronic encephalopathy, g tube, anxiety, depression  Clinical Impression  On eval, pt required Min assist for mobility. He walked ~150 feet with intermittent use of the hallway handrail. Pt is unsteady and at risk for falls. No family present during session. Pt should be able to return home as long as he has assistance. Could benefit from a RW if he doesn't already have one.     Follow Up Recommendations Supervision/Assistance - 24 hour;Supervision for mobility/OOB    Equipment Recommendations  Rolling walker with 5" wheels (if pt doesn't already have one)    Recommendations for Other Services       Precautions / Restrictions Precautions Precautions: Fall Restrictions Weight Bearing Restrictions: No      Mobility  Bed Mobility Overal bed mobility: Needs Assistance Bed Mobility: Supine to Sit     Supine to sit: Supervision     General bed mobility comments: for safety, cues  Transfers Overall transfer level: Needs assistance   Transfers: Sit to/from Stand Sit to Stand: Min assist         General transfer comment: assist to steady  Ambulation/Gait Ambulation/Gait assistance: Min assist Ambulation Distance (Feet): 150 Feet Assistive device: None (intermittent use of hallway handrail) Gait Pattern/deviations: Step-through pattern;Narrow base of support;Staggering left;Staggering right;Drifts right/left     General Gait Details: unsteady.   Stairs            Wheelchair Mobility    Modified Rankin (Stroke Patients Only)       Balance Overall balance assessment: Needs assistance           Standing balance-Leahy Scale: Fair                               Pertinent Vitals/Pain Pain Assessment:  Faces Faces Pain Scale: No hurt    Home Living Family/patient expects to be discharged to:: Private residence Living Arrangements: Parent Available Help at Discharge: Family             Additional Comments: no family present during eval to provide info    Prior Function Level of Independence: Needs assistance   Gait / Transfers Assistance Needed: unsure of PLOF. no family present during eval           Hand Dominance        Extremity/Trunk Assessment   Upper Extremity Assessment Upper Extremity Assessment: Overall WFL for tasks assessed    Lower Extremity Assessment Lower Extremity Assessment: Generalized weakness (L foot/LE internal rotation noted)    Cervical / Trunk Assessment Cervical / Trunk Assessment: Normal  Communication   Communication: Expressive difficulties;Receptive difficulties  Cognition Arousal/Alertness: Awake/alert Behavior During Therapy: WFL for tasks assessed/performed Overall Cognitive Status: History of cognitive impairments - at baseline                                        General Comments      Exercises     Assessment/Plan    PT Assessment Patient needs continued PT services  PT Problem List Decreased mobility;Decreased balance;Decreased cognition       PT Treatment Interventions DME instruction;Gait training;Therapeutic activities;Therapeutic exercise;Patient/family education;Functional  mobility training;Balance training    PT Goals (Current goals can be found in the Care Plan section)  Acute Rehab PT Goals Patient Stated Goal: none stated PT Goal Formulation: Patient unable to participate in goal setting Time For Goal Achievement: 01/14/17 Potential to Achieve Goals: Good    Frequency Min 3X/week   Barriers to discharge        Co-evaluation               AM-PAC PT "6 Clicks" Daily Activity  Outcome Measure Difficulty turning over in bed (including adjusting bedclothes, sheets and  blankets)?: A Little Difficulty moving from lying on back to sitting on the side of the bed? : A Little Difficulty sitting down on and standing up from a chair with arms (e.g., wheelchair, bedside commode, etc,.)?: A Little Help needed moving to and from a bed to chair (including a wheelchair)?: A Little Help needed walking in hospital room?: A Little Help needed climbing 3-5 steps with a railing? : A Little 6 Click Score: 18    End of Session Equipment Utilized During Treatment: Gait belt Activity Tolerance: Patient tolerated treatment well Patient left: in chair;with call bell/phone within reach;with chair alarm set   PT Visit Diagnosis: Muscle weakness (generalized) (M62.81);Difficulty in walking, not elsewhere classified (R26.2)    Time: 1348-1410 PT Time Calculation (min) (ACUTE ONLY): 22 min   Charges:   PT Evaluation $PT Eval Low Complexity: 1 Low     PT G Codes:         [name] AlertJannie Hao Dion, MPT Pager: [phone]

## 2016-12-31 NOTE — Progress Notes (Signed)
   12/31/16 1449  PT Time Calculation  PT Start Time (ACUTE ONLY) 1348  PT Stop Time (ACUTE ONLY) 1410  PT Time Calculation (min) (ACUTE ONLY) 22 min  PT G-Codes **NOT FOR INPATIENT CLASS**  Functional Assessment Tool Used AM-PAC 6 Clicks Basic Mobility;Clinical judgement  Functional Limitation Mobility: Walking and moving around  Mobility: Walking and Moving Around Current Status (Z6109(G8978) CJ  Mobility: Walking and Moving Around Goal Status (U0454(G8979) CI  PT General Charges  $$ ACUTE PT VISIT 1 Visit  PT Evaluation  $PT Eval Low Complexity 1 Low   [name] AlertJannie Marqueta [name], MPT [phone]

## 2016-12-31 NOTE — Progress Notes (Signed)
Initial Nutrition Assessment  DOCUMENTATION CODES:   Underweight, Severe malnutrition in context of acute illness/injury  INTERVENTION:    Monitor and encourage PO intake  Ensure Enlive po BID, each supplement provides 350 kcal and 20 grams of protein  Magic cup TID with meals, each supplement provides 290 kcal and 9 grams of protein  If tube feeding warranted provide:  Osmolite 1.2  @ 60 ml/hr  30 ml Prostat Q24 hours Provides: 1828 kcals, 95 grams protein, 1180 ml free water. This meets 100% of calorie needs and 100% of protein needs.   NUTRITION DIAGNOSIS:   Malnutrition (Severe) related to acute illness, poor appetite as evidenced by 14% weight loss in 3 months, moderate depletion of body fat, severe depletion of muscle mass.  GOAL:   Patient will meet greater than or equal to 90% of their needs  MONITOR:   PO intake, Supplement acceptance, Labs, Weight trends  REASON FOR ASSESSMENT:   Consult Assessment of nutrition requirement/status  ASSESSMENT:   Pt with PMH of TBI, seizure disorder, CKD, alcohol abuse, cirrhosis, DVT, and hx of PEG tube which was removed later after PO intake progressed. Presents this admission with failure to thrive, decreased oral intake, and dehydration.   Pt unable to answer questions at this time. No family at bedside to obtain nutrition related history. MD notes: pt' s father reported that about 2 weeks ago he was able to ambulate and eating reasonably okay, and since has gradually declined. Pt noted to be on dys 2 diet related to swallowing difficulties. SLP was not able to evaluate at this time due to low BP readings. Pt consuming 8% average of last 3 meals. Pt had PEG tube previously, that was removed when PO intake began meeting energy requirements (unsure of date).  MD spoke with father regarding tube feeding as pt is malnourished and has trouble swallowing. Father verbalized understanding.   Records indicate pt has lost 14% of body wt  in 3 months. This percentage in this time frame is significant. Nutrition-Focused physical exam completed. Findings are moderate to severe fat depletion, severe muscle depletion, and no edema. RD to provide supplementation and encourage PO intake. If poor PO intake persist, recommend looking at nutrition support. See recommendations above.   Medications reviewed and include: NaCl with KCl @ 100 ml/hr, Depacon Labs reviewed: Creatinine 1.43 (H)   Diet Order:  DIET DYS 2 Room service appropriate? Yes; Fluid consistency: Thin  Skin:  Reviewed, no issues  Last BM:  12/30/16  Height:   Ht Readings from Last 1 Encounters:  10/22/15 5\' 6"  (1.676 m)    Weight:   Wt Readings from Last 1 Encounters:  12/30/16 114 lb 3.2 oz (51.8 kg)    Ideal Body Weight:  75.5 kg  BMI:  Body mass index is 18.43 kg/m.  Estimated Nutritional Needs:   Kcal:  1700-1900 (33- 37 kcal/kg)  Protein:  95-105 grams (1.8-2 g/kg)  Fluid:  >1.7 L/day  EDUCATION NEEDS:   No education needs identified at this time  Vanessa Kickarly Ahmaya Ostermiller RD, LDN Clinical Nutrition Pager # - [phone]

## 2016-12-31 NOTE — Progress Notes (Signed)
PROGRESS NOTE                                                                                                                                                                                                             Patient Demographics:    Hector [name], is a 46 y.o. male, DOB - [birth year], ZOX:[id number]  Admit date - 12/30/2016   Admitting Physician Dron Jaynie Collins, MD  Outpatient Primary MD for the patient is Ulyess Blossom, MD  LOS - 0  Outpatient Specialists:None  Chief Complaint  Patient presents with  . Hypotension  . Emesis       Brief Narrative   46 y.o. male with medical history significant of traumatic brain injury, seizure disorder, chronic kidney disease, anxiety depression, history of gastrostomy tube placement for feeding which was removed later when patient started eating well was brought to the ED by his father for poor by mouth intake with generalized weakness for past 2 weeks.  In the ED patient was found to be hypotensive with blood pressure in the 90s and severely dehydrated. Blood work showed severe hypokalemia with potassium <2, elevated creatinine and chronic thrombocytopenia.. Placed on observation.    Subjective:   Patient poorly communicative but reports having difficulty swallowing for which he is having poor by mouth intake.   Assessment  & Plan :    Principal problem Failure to thrive/ severe dehydration -due to poor by mouth intake for past 2 weeks. Patient reports having difficulty swallowing (points at his upper esophageal area stating food getting stuck there). SLP and dietitian consulted. -Still has low blood pressure but better with IV fluids. -Seen by PT and recommend home with 24-hour supervision and use of rolling walker.  Nausea and vomiting Abdominal exam and x-ray benign. Currently resolved.  Acute on chronic kidney disease stage III Prerenal secondary to  dehydration. Improved with IV fluids. Next on chronic, cytopenia Stable.  Hypotension Secondary to hypovolemia and dehydration. Held home blood pressure medications (Lasix, Aldactone and metoprolol). Continue aggressive IV hydration.   Severe hypokalemia Replenished aggressively. Improved in am lab. Low magnesium replenished.  Seizure disorder Continue home medications.     Code Status : Full code  Family Communication  : Father at bedside  Disposition Plan  : Home pending swallow study and clinical improvement   Barriers For  Discharge : Pending swallow study, hypotension  Consults  :  None  Procedures  : None    DVT Prophylaxis  : SCDs   Lab Results  Component Value Date   PLT 98 (L) 12/31/2016    Antibiotics  :  Anti-infectives    None        Objective:   Vitals:   12/30/16 2114 12/31/16 0531 12/31/16 1015 12/31/16 1323  BP: (!) 171/149 (!) 82/55 (!) 85/49 (!) 91/54  Pulse: 98 70  72  Resp: 20 20  18   Temp: (!) 97.4 F (36.3 C) 97.7 F (36.5 C)  98.1 F (36.7 C)  TempSrc: Oral Oral  Oral  SpO2: 96% 97%  97%  Weight:        Wt Readings from Last 3 Encounters:  12/30/16 51.8 kg (114 lb 3.2 oz)  09/16/16 55.8 kg (123 lb)  10/22/15 59.9 kg (132 lb)     Intake/Output Summary (Last 24 hours) at 12/31/16 1634 Last data filed at 12/31/16 1300  Gross per 24 hour  Intake          3201.67 ml  Output              350 ml  Net          2851.67 ml     Physical Exam  Gen: not in distressA poorly communicative  HEENT: no pallor,dry mucosa, supple neck  Chest: clear b/l, no added sounds CVS: N S1&S2, no murmurs, rubs or gallop GI: soft, NT, ND, BS+ Musculoskeletal: warm, no edema     Data Review:    CBC  Recent Labs Lab 12/30/16 1152 12/31/16 0519  WBC 6.7 4.9  HGB 13.7 11.5*  HCT 38.0* 33.2*  PLT 112* 98*  MCV 94.3 98.2  MCH 34.0 34.0  MCHC 36.1* 34.6  RDW 13.4 14.1    Chemistries   Recent Labs Lab 12/30/16 1152  12/30/16 1657 12/31/16 0519  NA 137 142 145  K <2.0* 3.8 3.6  CL 97* 107 110  CO2 28 27 29   GLUCOSE 137* 116* 90  BUN 22* 18 14  CREATININE 1.97* 1.76* 1.43*  CALCIUM 8.6* 8.7* 8.6*  MG 1.6*  --   --   AST 22  --   --   ALT 12*  --   --   ALKPHOS 101  --   --   BILITOT 1.0  --   --    ------------------------------------------------------------------------------------------------------------------ No results for input(s): CHOL, HDL, LDLCALC, TRIG, CHOLHDL, LDLDIRECT in the last 72 hours.  Lab Results  Component Value Date   HGBA1C 6.2 (H) 12/24/2013   ------------------------------------------------------------------------------------------------------------------ No results for input(s): TSH, T4TOTAL, T3FREE, THYROIDAB in the last 72 hours.  Invalid input(s): FREET3 ------------------------------------------------------------------------------------------------------------------ No results for input(s): VITAMINB12, FOLATE, FERRITIN, TIBC, IRON, RETICCTPCT in the last 72 hours.  Coagulation profile No results for input(s): INR, PROTIME in the last 168 hours.  No results for input(s): DDIMER in the last 72 hours.  Cardiac Enzymes No results for input(s): CKMB, TROPONINI, MYOGLOBIN in the last 168 hours.  Invalid input(s): CK ------------------------------------------------------------------------------------------------------------------ No results found for: BNP  Inpatient Medications  Scheduled Meds: . amantadine  100 mg Oral BID  . bromocriptine  5 mg Oral BID  . escitalopram  10 mg Oral Daily  . feeding supplement (ENSURE ENLIVE)  237 mL Oral BID BM  . linaclotide  290 mcg Oral Daily  . oxcarbazepine  600 mg Oral BID  . pantoprazole (PROTONIX) IV  40 mg  Intravenous Q24H  . QUEtiapine  100 mg Oral 2 times per day  . QUEtiapine  200 mg Oral QHS  . topiramate  100 mg Oral BID   Continuous Infusions: . 0.9 % NaCl with KCl 40 mEq / L 100 mL/hr (12/30/16 1703)   . lacosamide (VIMPAT) IV Stopped (12/31/16 1021)  . valproate sodium Stopped (12/31/16 1130)   PRN Meds:.acetaminophen **OR** acetaminophen, ondansetron **OR** ondansetron (ZOFRAN) IV  Micro Results No results found for this or any previous visit (from the past 240 hour(s)).  Radiology Reports Dg Abd 2 Views  Result Date: 12/30/2016 CLINICAL DATA:  Decreased appetite for 2 weeks. EXAM: ABDOMEN - 2 VIEW COMPARISON:  February 06, 2015 FINDINGS: The bowel gas pattern is normal. There is no evidence of free air. No radio-opaque calculi or other significant radiographic abnormality is seen. IMPRESSION: No bowel obstruction or free air. Electronically Signed   By: Sherian Rein M.D.   On: 12/30/2016 15:42    Time Spent in minutes  25   Eddie North M.D on 12/31/2016 at 4:34 PM  Between 7am to 7pm - Pager - [phone]  After 7pm go to [email or web address] - password [facility]  [facility] -  Office  [phone]

## 2016-12-31 NOTE — Progress Notes (Signed)
SLP Cancellation Note  Patient Details Name: Hector NianRobert W [name] MRN: [id number] DOB: [birth year]   Cancelled treatment:       Reason Eval/Treat Not Completed: Medical issues which prohibited therapy. Per RN, pt blood pressure is currently 85/49, and requests holding swallow evaluation at this time. Will recheck at a later time when pt more appropriate.  Arrie Borrelli B. Murvin NatalBueche, [facility] Joseph CountyMSP, CCC-SLP Speech Language Pathologist [phone]  Leigh AuroraBueche, [name] 12/31/2016, 10:22 AM

## 2016-12-31 NOTE — Progress Notes (Signed)
OT Cancellation Note  Patient Details Name: Hector NianRobert W [name] MRN: [id number] DOB: Apr 23, 1971   Cancelled Treatment:    Reason Eval/Treat Not Completed: Other (comment)  Noted low BP this day and pt having a procedure. Will check on pt later in day or next day.  Lise AuerLori [place], [place] [phone]  Einar CrowEDDING, Jearlean Demauro D 12/31/2016, 11:53 AM

## 2017-01-01 DIAGNOSIS — I951 Orthostatic hypotension: Secondary | ICD-10-CM

## 2017-01-01 DIAGNOSIS — E43 Unspecified severe protein-calorie malnutrition: Secondary | ICD-10-CM

## 2017-01-01 DIAGNOSIS — R1312 Dysphagia, oropharyngeal phase: Secondary | ICD-10-CM

## 2017-01-01 DIAGNOSIS — N189 Chronic kidney disease, unspecified: Secondary | ICD-10-CM

## 2017-01-01 LAB — BASIC METABOLIC PANEL
ANION GAP: 4 — AB (ref 5–15)
BUN: 9 mg/dL (ref 6–20)
CHLORIDE: 119 mmol/L — AB (ref 101–111)
CO2: 23 mmol/L (ref 22–32)
Calcium: 8.3 mg/dL — ABNORMAL LOW (ref 8.9–10.3)
Creatinine, Ser: 1.13 mg/dL (ref 0.61–1.24)
GFR calc Af Amer: >60 mL/min (ref >60)
GLUCOSE: 81 mg/dL (ref 65–99)
POTASSIUM: 3.8 mmol/L (ref 3.5–5.1)
Sodium: 146 mmol/L — ABNORMAL HIGH (ref 135–145)

## 2017-01-01 MED ORDER — PANTOPRAZOLE SODIUM 40 MG PO TBEC: 40.0000 mg | DELAYED_RELEASE_TABLET | Freq: Every day | ORAL | Status: DC

## 2017-01-01 MED ORDER — LACOSAMIDE 50 MG PO TABS
50.0000 mg | ORAL_TABLET | Freq: Two times a day (BID) | ORAL | Status: DC
  Administered 2017-01-01: 50 mg via ORAL
  Filled 2017-01-01: qty 1

## 2017-01-01 MED ORDER — DIVALPROEX SODIUM 250 MG PO DR TAB: 500.0000 mg | DELAYED_RELEASE_TABLET | Freq: Two times a day (BID) | ORAL | Status: DC

## 2017-01-01 NOTE — Progress Notes (Signed)
Pt discharged to home. DC instructions given with father at bedside. Father voiced understanding of importance to stop taking lasix and spironolactone. Pt left unit in wheelchair pushed by Kristin,rn and accompanied by dad. Left in good condition.  No concerns voiced.  Derinda SisVera Stamatia Masri,rn.

## 2017-01-01 NOTE — Discharge Summary (Signed)
Physician Discharge Summary  Belia HemanRobert W Hildebrandt WUJ:[id number] DOB: 1970/11/18 DOA: 12/30/2016  PCP: [name], [name], MD  Admit date: 12/30/2016 Discharge date: 01/01/2017  Admitted From: Home Disposition:  Home  Recommendations for Outpatient Follow-up:  1. Follow up with PCP in 1-2 weeks   Home Health:PT Equipment/Devices: Rolling walker  Discharge Condition: Fair CODE STATUS: Full code Diet recommendation: Dysphagia level III with thin liquid and nutritional supplement.     Discharge Diagnoses:  Principal problem Orthostatic hypotension  Active Problems:   Protein-calorie malnutrition, severe (HCC)   Acute kidney injury superimposed on CKD (HCC)   Dehydration   Failure to thrive in adult   Oropharyngeal dysphagia   Severe Hypokalemia   Hypomagnesemia  Brief narrative/history of present illness Please refer to admission H&P for details, in brief,46 y.o.malewith medical history significant of traumatic brain injury, seizure disorder, chronic kidney disease, anxiety depression, history of gastrostomy tube placement for feeding which was removed later when patient started eating well was brought to the ED by his father for poor by mouth intake with generalized weakness for past 2 weeks.  In the ED patient was found to be hypotensive with blood pressure in the 90s and severely dehydrated. Blood work showed severe hypokalemia with potassium <2, elevated creatinine and chronic thrombocytopenia.. Patient admitted to hospitalist service.  Hospital course  Principal problem  Orthostatic hypotension.  secondary to severe dehydration. Blood pressure improved with IV fluids but systolic BP still in the 90s. I will hold his Lasix and Aldactone upon discharge but resume his metoprolol. Follow-up with PCP in 1 week.  Failure to thrive/ severe dehydration -due to poor by mouth intake for past 2 weeks. Patient reports having difficulty swallowing . -Blood pressure improved with IV  fluids. -Seen by PT and recommend home with 24-hour supervision and use of rolling walker. Will arrange home health PT.  Acute on chronic kidney disease stage III Prerenal secondary to dehydration. Resolved with IV fluids. Hold Lasix and Aldactone upon discharge.  Oropharyngeal dysphagia. Seen by SLP and recommend dysphagia level III diet with thin liquids. Appears that patient was not thickening liquids to nectar consistency at home which was contributing to his dysphagia. Patient has been tolerating dysphagia diet while in the hospital. Dietary instructions provided to patient's father. If patient has persistent symptoms he will need outpatient MBS.  Severe protein calorie malnutrition Dietitian consult appreciated and recommend continuing ensure twice a day and Magic cup 3 times a day with meals.  Severe hypokalemia Replenished aggressively. Improved with repeat labs.. Low magnesium replenished.   Chronic thrombocytopenia Stable.   Nausea and vomiting Abdominal exam and x-ray benign. Currently resolved.   Seizure disorder Continue home medications.  Patient clinically stable to be discharged home with outpatient follow-up.  Family Communication  :  discussed with father on the phone Disposition Plan  : Home   Consults  :  None  Procedures  : None    Discharge Instructions   Allergies as of 01/01/2017      Reactions   Ativan [lorazepam] Other (See Comments)   Pt has paradoxical effect with sedating medications.    Lactulose Hives      Medication List    STOP taking these medications   furosemide 40 MG tablet Commonly known as:  LASIX   spironolactone 25 MG tablet Commonly known as:  ALDACTONE     TAKE these medications   amantadine 100 MG capsule Commonly known as:  SYMMETREL Take 100 mg by mouth 2 (two)  times daily.   bromocriptine 2.5 MG tablet Commonly known as:  PARLODEL Take 5 mg by mouth 2 (two) times daily.   clonazePAM 1 MG  tablet Commonly known as:  KLONOPIN Take 1 mg by mouth at bedtime. What changed:  Another medication with the same name was removed. Continue taking this medication, and follow the directions you see here.   divalproex 250 MG DR tablet Commonly known as:  DEPAKOTE Take 500 mg by mouth 2 (two) times daily.   docusate sodium 100 MG capsule Commonly known as:  COLACE Take 100 mg by mouth 2 (two) times daily. Reported on 07/23/2015   ENSURE PLUS Liqd Take 237 mLs by mouth 2 (two) times daily between meals.   escitalopram 10 MG tablet Commonly known as:  LEXAPRO Take 10 mg by mouth daily.   lacosamide 50 MG Tabs tablet Commonly known as:  VIMPAT Take 1 tablet (50 mg total) by mouth 2 (two) times daily.   LINZESS 290 MCG Caps capsule Generic drug:  linaclotide Take 290 mcg by mouth daily.   metoprolol tartrate 25 MG tablet Commonly known as:  LOPRESSOR Take 12.5 mg by mouth 2 (two) times daily.   ondansetron 4 MG tablet Commonly known as:  ZOFRAN Take 1 tablet (4 mg total) by mouth every 6 (six) hours. What changed:  when to take this  reasons to take this   oxcarbazepine 600 MG tablet Commonly known as:  TRILEPTAL TAKE 1 TABLET BY MOUTH TWICE A DAY   pantoprazole 40 MG tablet Commonly known as:  PROTONIX Take 40 mg by mouth daily as needed (for heartburn).   QUEtiapine 100 MG tablet Commonly known as:  SEROQUEL Take 100-200 mg by mouth 3 (three) times daily. Take  by mouth at 10am,  by mouth at 1400, and  by mouth at bedtime   topiramate 100 MG tablet Commonly known as:  TOPAMAX TAKE 1 TABLET BY MOUTH TWICE A DAY      Follow-up Information    Teola Bradley II, MD. Schedule an appointment as soon as possible for a visit in 1 week(s).   Specialty:  Family Medicine Contact information: [address] [place] [address] [phone]          Allergies  Allergen Reactions  . Ativan [Lorazepam] Other (See Comments)    Pt has  paradoxical effect with sedating medications.   . Lactulose Hives      Procedures/Studies: Dg Abd 2 Views  Result Date: 12/30/2016 CLINICAL DATA:  Decreased appetite for 2 weeks. EXAM: ABDOMEN - 2 VIEW COMPARISON:  February 06, 2015 FINDINGS: The bowel gas pattern is normal. There is no evidence of free air. No radio-opaque calculi or other significant radiographic abnormality is seen. IMPRESSION: No bowel obstruction or free air. Electronically Signed   By: Sherian Rein M.D.   On: 12/30/2016 15:42       Subjective: Denies difficulty swallowing. Blood pressure improved.  Discharge Exam: Vitals:   01/01/17 0530 01/01/17 1241  BP: (!) 98/58 97/69  Pulse: 64 (!) 105  Resp: 18 18  Temp: 97.8 F (36.6 C) (!) 97.5 F (36.4 C)  SpO2: 99% 94%   Vitals:   12/31/16 1323 12/31/16 2055 01/01/17 0530 01/01/17 1241  BP: (!) 91/54 (!) 91/56 (!) 98/58 97/69  Pulse: 72 72 64 (!) 105  Resp: Temp: 98.1 F (36.7 C) 98.2 F (36.8 C) 97.8 F (36.6 C) (!) 97.5 F (36.4 C)  TempSrc: Oral Oral  Oral Oral  SpO2: 97% 100% 99% 94%  Weight:        Gen: not in distress, poorly communicative  HEENT: no pallor, moist mucosa, supple neck  Chest: clear b/l, no added sounds CVS: N S1&S2, no murmurs GI: soft, NT, ND, BS+ Musculoskeletal: warm, no edema    The results of significant diagnostics from this hospitalization (including imaging, microbiology, ancillary and laboratory) are listed below for reference.     Microbiology: No results found for this or any previous visit (from the past 240 hour(s)).   Labs: BNP (last 3 results) No results for input(s): BNP in the last 8760 hours. Basic Metabolic Panel:  Recent Labs Lab 12/30/16 1152 12/30/16 1657 12/31/16 0519 01/01/17 0646  NA 137 142 145 146*  K <2.0* 3.8 3.6 3.8  CL 97* 107 110 119*  CO2 GLUCOSE 137* 116* 90 81  BUN 22* CREATININE 1.97* 1.76* 1.43* 1.13  CALCIUM 8.6* 8.7* 8.6* 8.3*  MG  1.6*  --   --   --    Liver Function Tests:  Recent Labs Lab 12/30/16 1152  AST 22  ALT 12*  ALKPHOS 101  BILITOT 1.0  PROT 7.1  ALBUMIN 3.9    Recent Labs Lab 12/30/16 1152 12/31/16 0519  LIPASE 56* 48   No results for input(s): AMMONIA in the last 168 hours. CBC:  Recent Labs Lab 12/30/16 1152 12/31/16 0519  WBC 6.7 4.9  HGB 13.7 11.5*  HCT 38.0* 33.2*  MCV 94.3 98.2  PLT 112* 98*   Cardiac Enzymes: No results for input(s): CKTOTAL, CKMB, CKMBINDEX, TROPONINI in the last 168 hours. BNP: Invalid input(s): POCBNP CBG: No results for input(s): GLUCAP in the last 168 hours. D-Dimer No results for input(s): DDIMER in the last 72 hours. Hgb A1c No results for input(s): HGBA1C in the last 72 hours. Lipid Profile No results for input(s): CHOL, HDL, LDLCALC, TRIG, CHOLHDL, LDLDIRECT in the last 72 hours. Thyroid function studies No results for input(s): TSH, T4TOTAL, T3FREE, THYROIDAB in the last 72 hours.  Invalid input(s): FREET3 Anemia work up No results for input(s): VITAMINB12, FOLATE, FERRITIN, TIBC, IRON, RETICCTPCT in the last 72 hours. Urinalysis    Component Value Date/Time   COLORURINE YELLOW 12/30/2016 1500   APPEARANCEUR CLEAR 12/30/2016 1500   LABSPEC 1.005 12/30/2016 1500   PHURINE 6.0 12/30/2016 1500   GLUCOSEU NEGATIVE 12/30/2016 1500   HGBUR NEGATIVE 12/30/2016 1500   BILIRUBINUR NEGATIVE 12/30/2016 1500   KETONESUR NEGATIVE 12/30/2016 1500   PROTEINUR NEGATIVE 12/30/2016 1500   UROBILINOGEN 1.0 02/12/2015 1912   NITRITE NEGATIVE 12/30/2016 1500   LEUKOCYTESUR NEGATIVE 12/30/2016 1500   Sepsis Labs Invalid input(s): PROCALCITONIN,  WBC,  LACTICIDVEN Microbiology No results found for this or any previous visit (from the past 240 hour(s)).   Time coordinating discharge: Over 30 minutes  SIGNED:   Eddie North, MD  [facility] 01/01/2017, 3:35 PM Pager   If 7PM-7AM, please contact  night-coverage [email or web address] Password [email or web address]

## 2017-01-01 NOTE — Progress Notes (Signed)
PHARMACIST - PHYSICIAN COMMUNICATION  DR:   Gonzella Lexhungel et al  CONCERNING: IV to Oral Route Change Policy  RECOMMENDATION: This patient is receiving vimpat, depakote, and protonix by the intravenous route.  Based on criteria approved by the Pharmacy and Therapeutics Committee, the intravenous medication(s) is/are being converted to the equivalent oral dose form(s).   DESCRIPTION: These criteria include:  The patient is eating (either orally or via tube) and/or has been taking other orally administered medications for a least 24 hours  The patient has no evidence of active gastrointestinal bleeding or impaired GI absorption (gastrectomy, short bowel, patient on TNA or NPO).  If you have questions about this conversion, please contact the Pharmacy Department  []   [phone]( [phone] )  [facility] []   [phone]( [phone] )  [facility] []   [phone]( [phone] )  [facility] []   [phone]( [phone] )  [facility] [x]   [phone]( [phone] )  [facility]  Herby AbrahamMichelle T. Falen Lehrmann, [place].  01/01/2017 11:18 AM

## 2017-01-01 NOTE — Care Management Note (Signed)
Received referral to assist pt with HHPT. [facility]. Per Vaughan BastaJermaine, pt's dx won't qualify him for HHPT per Medicaid criteria.

## 2017-01-01 NOTE — Discharge Instructions (Signed)
Dysphagia Diet Level 3, Mechanically Advanced °The dysphagia level 3 diet includes foods that are soft, moist, and can be chopped into 1-inch chunks. This diet is helpful for people with mild swallowing difficulties. It reduces the risk of food getting caught in the windpipe, trachea, or lungs. °What do I need to know about this diet? °· You may eat foods that are soft and moist. °· If you were on the dysphagia level 1 or level 2 diets, you may eat any of the foods included on those lists. °· Avoid foods that are dry, hard, sticky, chewy, coarse, and crunchy. Also avoid large cuts of food. °· Take small bites. Each bite should contain 1 inch or less of food. °· Thicken liquids if instructed by your health care provider. Follow your health care provider's instructions on how to do this and to what consistency. °· See your dietitian or speech language pathologist regularly for help with your dietary changes. °What foods can I eat? °Grains °Moist breads without nuts or seeds. Biscuits, muffins, pancakes, and waffles well-moistened with syrup, jelly, margarine, or butter. Smooth cereals with plenty of milk to moisten them. Moist bread stuffing. Moist rice. °Vegetables °All cooked, soft vegetables. Shredded lettuce. Tender fried potatoes. °Fruits °All canned and cooked fruits. Soft, peeled fresh fruits, such as peaches, nectarines, kiwis, cantaloupe, honeydew melon, and watermelon without seeds. Soft berries, such as strawberries. °Meat and Other Protein Sources °Moist ground or finely diced or sliced meats. Solid, tender cuts of meat. Meatloaf. Hamburger with a bun. Sausage patty. Deli thin-sliced lunch meat. Chicken, egg, or tuna salad sandwich. Sloppy joe. Moist fish. Eggs prepared any way. Casseroles with small chunks of meats, ground meats, or tender meats. °Dairy °Cheese spreads without coarse large chunks. Shredded cheese. Cheese slices. Cottage cheese. Milk at the right texture. Smooth frappes. Yogurt without  nuts or coconut. Ask your health care provider whether you can have frozen desserts (such as malts or milk shakes) and thin liquids. °Sweets/Desserts °Soft, smooth, moist desserts. Non-chewy, smooth candy. Jam. Jelly. Honey. Preserves. Ask your health care provider whether you can have frozen desserts. °Fats and Oils °Butter. Oils. Margarine. Mayonnaise. Gravy. Spreads. °Other °All seasonings and sweeteners. All sauces without large chunks. °The items listed above may not be a complete list of recommended foods or beverages. Contact your dietitian for more options. °What foods are not recommended? °Grains °Coarse or dry cereals. Dry breads. Toast. Crackers. Tough, crusty breads, such French bread and baguettes. Tough, crisp fried potatoes. Potato skins. Dry bread stuffing. Granola. Popcorn. Chips. °Vegetables °All raw vegetables except shredded lettuce. Cooked corn. Rubbery or stiff cooked vegetables. Stringy vegetables, such as celery. °Fruits °Hard fruits that are difficult to chew, such as apples or pears. Stringy, high-pulp fruits, such as pineapple, papaya, or mango. Fruits with tough skins, such as grapes. Coconut. All dried fruits. Fruit leather. Fruit roll-ups. Fruit snacks. °Meat and Other Protein Sources °Dry or tough meats or poultry. Dry fish. Fish with bones. Peanut butter. All nuts and seeds. °Dairy °Any with nuts, seeds, chocolate chips, dried fruit, coconut, or pineapple. °Sweets/Desserts °Dry cakes. Chewy or dry cookies. Any with nuts, seeds, dry fruits, coconut, pineapple, or anything dry, sticky, or hard. Chewy caramel. Licorice. Taffy-type candies. Ask your health care provider whether you can have frozen desserts. °Fats and Oils °Any with chunks, nuts, seeds, or pineapple. Olives. Pickles. °Other °Soups with tough or large chunks of meats, poultry, or vegetables. Corn or clam chowder. °The items listed above may not be a   complete list of foods and beverages to avoid. Contact your dietitian for  more information. °This information is not intended to replace advice given to you by your health care provider. Make sure you discuss any questions you have with your health care provider. °Document Released: 04/12/2005 Document Revised: 09/18/2015 Document Reviewed: 03/26/2013 °Elsevier Interactive Patient Education © 2018 [employer] Inc[employer] ° °

## 2017-01-01 NOTE — Evaluation (Signed)
Occupational Therapy Evaluation Patient Details Name: Hector NianRobert W [name] MRN: [id number] DOB: June 24, 1970 Today's Date: 01/01/2017    History of Present Illness 46 yo male admitted with FTT, weakness. Hx of TBI, ETOH abuse, Sz, DVT, cirrhosis, chronic encephalopathy, g tube, anxiety, depression   Clinical Impression   Pt was admitted for the above.  No family present, so unsure of PLOF.  Pt was unsteady and needed occasional min A walking to bathroom; he declined RW.  Pt did not follow all multimodal cues and he is in a different environment, so eval was limited. Will focus on balance activities to increase safety during adls/bathroom transfers    Follow Up Recommendations  Supervision/Assistance - 24 hour    Equipment Recommendations  None recommended by OT    Recommendations for Other Services       Precautions / Restrictions Precautions Precautions: Fall Restrictions Weight Bearing Restrictions: No      Mobility Bed Mobility         Supine to sit: Supervision;HOB elevated     General bed mobility comments: supervision back to bed, HOB elevated  Transfers   Equipment used: None   Sit to Stand: Min assist         General transfer comment: light steadying assistance    Balance             Standing balance-Leahy Scale: Fair                             ADL either performed or assessed with clinical judgement   ADL Overall ADL's : Needs assistance/impaired     Grooming: Wash/dry hands;Supervision/safety;Standing                   Toilet Transfer: Min guard;Minimal assistance;Ambulation;Comfort height toilet   Toileting- Clothing Manipulation and Hygiene: Supervision/safety;Sitting/lateral lean         General ADL Comments: steadying assistance at times. Pt declined use of RW.  He performed limited ADLs--unable to understand me when I asked him to place socks back on--resisted hand over hand guiding and did not follow multimodal  cues.  Based on clinical reasoning, pt needs min A for UB adls and max A for LB adls.  Pt is outside of his normal routine and may do much better in his own environment     Vision         Perception Perception Comments: pt turned around to avoid tangling IV line without cues   Praxis      Pertinent Vitals/Pain Pain Assessment: Faces Pain Score: 0-No pain Faces Pain Scale: No hurt     Hand Dominance     Extremity/Trunk Assessment Upper Extremity Assessment Upper Extremity Assessment: Overall WFL for tasks assessed (functionally)           Communication Communication Communication: Receptive difficulties;Expressive difficulties.  Pt grunts a lot and points to make needs known.  Says "no" and "I know"    Cognition Arousal/Alertness: Awake/alert Behavior During Therapy: WFL for tasks assessed/performed Overall Cognitive Status: No family/caregiver present to determine baseline cognitive functioning                                 General Comments: pt with expressive and receptive difficulties.  Unable to assess cognition;no family available.  Does not follow multimodal cues consistently.   General Comments       Exercises  Shoulder Instructions      Home Living Family/patient expects to be discharged to:: Private residence Living Arrangements: Parent Available Help at Discharge: Family                             Additional Comments: per RN. No family present.  Pt unable to answer questions      Prior Functioning/Environment          Comments: unsure of PLOF        OT Problem List: Impaired balance (sitting and/or standing);Decreased cognition;Decreased safety awareness      OT Treatment/Interventions: Self-care/ADL training;Patient/family education;Balance training    OT Goals(Current goals can be found in the care plan section) Acute Rehab OT Goals Patient Stated Goal: none stated OT Goal Formulation: Patient unable to  participate in goal setting Time For Goal Achievement: 01/08/17 Potential to Achieve Goals: Fair ADL Goals Pt Will Transfer to Toilet: with supervision;ambulating;regular height toilet Additional ADL Goal #1: pt will not have any LOB during dynamic standing ADL tasks  OT Frequency: Min 2X/week   Barriers to D/C:            Co-evaluation              AM-PAC PT "6 Clicks" Daily Activity     Outcome Measure Help from another person eating meals?: None Help from another person taking care of personal grooming?: A Little Help from another person toileting, which includes using toliet, bedpan, or urinal?: A Little Help from another person bathing (including washing, rinsing, drying)?: A Lot Help from another person to put on and taking off regular upper body clothing?: A Little Help from another person to put on and taking off regular lower body clothing?: A Lot 6 Click Score: 17   End of Session Nurse Communication:  (pt had BM)  Activity Tolerance: Patient tolerated treatment well Patient left: in bed;with call bell/phone within reach;with bed alarm set  OT Visit Diagnosis: Unsteadiness on feet (R26.81)                Time: 4098-1191 OT Time Calculation (min): 18 min Charges:  OT General Charges $OT Visit: 1 Visit OT Evaluation $OT Eval Moderate Complexity: 1 Mod G-Codes:     [place], OTR/L [phone] 01/01/2017  Hector [name] 01/01/2017, 9:20 AM

## 2017-01-01 NOTE — Evaluation (Signed)
Clinical/Bedside Swallow Evaluation Patient Details  Name: Hector [name] MRN: [id number] Date of Birth: [birth year]  Today's Date: 01/01/2017 Time: SLP Start Time (ACUTE ONLY): 1430 SLP Stop Time (ACUTE ONLY): 1500 SLP Time Calculation (min) (ACUTE ONLY): 30 min  Past Medical History:  Past Medical History:  Diagnosis Date  . Cirrhosis (HCC)   . Depression   . DVT (deep venous thrombosis) (HCC)    UE, LE DVT during hospitalization  . GERD (gastroesophageal reflux disease)   . H/O ETOH abuse   . H/O renal calculi   . Seizure (HCC)   . TBI (traumatic brain injury) (HCC)    07/2013, fall from standing post seizure   Past Surgical History:  Past Surgical History:  Procedure Laterality Date  . BRAIN SURGERY    . GASTROSTOMY TUBE PLACEMENT    . REMOVAL OF GASTROSTOMY TUBE    . TRACHEOSTOMY     feinstein   HPI:  Patient is a 46 y.o. male with PMH: TBI, ETOH abuse, seizure disorder, chronic kidney disease, anxiety, depression, h/o gastronomy tube placement which was removed when patient started exhibiting improved PO intake, who was brought to hospital by his father secondary to dehydration, generalized weakness, with decline in ambulation and PO intake over the course of the two weeks prior to admission.   Assessment / Plan / Recommendation Clinical Impression  Patient exhibits a mild oral and mild-moderate pharyngeal dysphagia chacterized by swallow initiation delays, delayed coughing and throat clearing with large volume sips of thin liquids via straw and cup sips, and mild decreased in mastication and oral manipulation of regular solid textures. Patient had an MBS 06/26/14, with recommendation at that time being Dys 3 (mechanical soft) solids and nectar thick liquids. Patient was unable to provide history and no family present, however suspect that patient was not thickening liquids to nectar consistency at home, and as current admission is for generalized weakness, reduced oral intake,  do not feel necessary to alter diet at this time. Recommendation is for father (who is caregiver for patient) monitor patient at home and if throat clearing and coughing persists or worsens during PO intake, will need to bring patient in for an Outpatient MBS.  SLP Visit Diagnosis: Dysphagia, oropharyngeal phase (R13.12)    Aspiration Risk  Mild aspiration risk    Diet Recommendation Dysphagia 3 (Mech soft);Thin liquid   Liquid Administration via: Cup Medication Administration: Crushed with puree Supervision: Intermittent supervision to cue for compensatory strategies Compensations: Minimize environmental distractions;Slow rate;Small sips/bites    Other  Recommendations Oral Care Recommendations: Oral care BID   Follow up Recommendations Other (comment) (If patient has persistent coughing/throat clearing when eating/drinking, will need Outpatient MBS)      Frequency and Duration min 1 x/week  1 week       Prognosis Prognosis for Safe Diet Advancement: Good      Swallow Study   General Date of Onset: 12/30/16 HPI: Patient is a 46 y.o. male with PMH: TBI, ETOH abuse, seizure disorder, chronic kidney disease, anxiety, depression, h/o gastronomy tube placement which was removed when patient started exhibiting improved PO intake, who was brought to hospital by his father secondary to dehydration, generalized weakness, with decline in ambulation and PO intake over the course of the two weeks prior to admission. Type of Study: Bedside Swallow Evaluation Previous Swallow Assessment: 06/26/14: MBSS recommended Dys 3, nectar thick liquids Diet Prior to this Study: Dysphagia 2 (chopped);Thin liquids Temperature Spikes Noted: No Respiratory Status: Room air History of  Recent Intubation: No Behavior/Cognition: Alert;Cooperative;Confused;Requires cueing Oral Cavity Assessment: Within Functional Limits Oral Care Completed by SLP: No Oral Cavity - Dentition: Adequate natural dentition Vision:  Functional for self-feeding Self-Feeding Abilities: Able to feed self Patient Positioning: Upright in bed Baseline Vocal Quality: Normal Volitional Cough: Cognitively unable to elicit Volitional Swallow: Unable to elicit    Oral/Motor/Sensory Function Overall Oral Motor/Sensory Function: Within functional limits   Ice Chips Ice chips: Not tested   Thin Liquid Thin Liquid: Impaired Presentation: Cup;Straw;Self Fed Pharyngeal  Phase Impairments: Suspected delayed Swallow    Nectar Thick Nectar Thick Liquid: Not tested   Honey Thick Honey Thick Liquid: Not tested   Puree Puree: Within functional limits   Solid   GO   Solid: Impaired Oral Phase Impairments: Impaired mastication Oral Phase Functional Implications: Prolonged oral transit        Hector NevinJohn T. Joselyne [name], MA, CCC-SLP 01/01/17 3:12 PM

## 2017-01-16 NOTE — ED Provider Notes (Signed)
[facility]-EMERGENCY DEPT Provider Note   CSN: [id number] Arrival date & time: 12/30/16  1128     History   Chief Complaint Chief Complaint  Patient presents with  . Hypotension  . Emesis    HPI Hector [name] is a 46 y.o. male.  HPI  46 y.o. male with medical history significant of traumatic brain injury, seizure disorder, chronic kidney disease, anxiety depression, history of gastrostomy tube placement for feeding which was removed later when patient started eating well brought in by patient's father for the evaluation of not eating well associated with generalized weakness, dehydration. Patient's father reported that about 2 weeks ago he was able to ambulate and eating reasonably okay. Since then he is gradually declining and not really eating or drinking much. His weakness is gradually worsened.    Past Medical History:  Diagnosis Date  . Cirrhosis (HCC)   . Depression   . DVT (deep venous thrombosis) (HCC)    UE, LE DVT during hospitalization  . GERD (gastroesophageal reflux disease)   . H/O ETOH abuse   . H/O renal calculi   . Seizure (HCC)   . TBI (traumatic brain injury) (HCC)    07/2013, fall from standing post seizure    Patient Active Problem List   Diagnosis Date Noted  . Oropharyngeal dysphagia 01/01/2017  . Orthostatic hypotension 01/01/2017  . Failure to thrive in adult 12/31/2016  . Vomiting   . Seizure (HCC) 06/01/2015  . Alcoholic cirrhosis of liver without ascites (HCC)   . Dehydration   . Encephalopathy acute 02/13/2015  . Altered mental status   . Ataxia 02/12/2015  . Localization-related symptomatic epilepsy and epileptic syndromes with complex partial seizures, not intractable, without status epilepticus (HCC) 08/16/2014  . Tobacco abuse 08/16/2014  . Acute kidney injury superimposed on CKD (HCC) 06/25/2014  . Dysphagia 06/25/2014  . Centrilobular emphysema (HCC) 01/27/2014  . Tracheostomy in place [facility]) 01/27/2014  . Encounter for family  conference with patient present 01/23/2014  . Dvt femoral (deep venous thrombosis) (HCC) 01/05/2014  . Protein-calorie malnutrition, severe (HCC) 01/05/2014  . Alcohol abuse 01/05/2014  . Acute respiratory failure with hypoxia (HCC) 12/20/2013  . Delirium due to general medical condition 12/13/2013  . HCAP (healthcare-associated pneumonia) 12/13/2013  . Sepsis (HCC) 12/13/2013  . TBI (traumatic brain injury) (HCC) 12/12/2013    Past Surgical History:  Procedure Laterality Date  . BRAIN SURGERY    . GASTROSTOMY TUBE PLACEMENT    . REMOVAL OF GASTROSTOMY TUBE    . TRACHEOSTOMY     feinstein       Home Medications    Prior to Admission medications   Medication Sig Start Date End Date Taking? Authorizing Provider  amantadine (SYMMETREL) 100 MG capsule Take 100 mg by mouth 2 (two) times daily.   Yes [provider]  bromocriptine (PARLODEL) 2.5 MG tablet Take 5 mg by mouth 2 (two) times daily.   Yes [provider]  clonazePAM (KLONOPIN) 1 MG tablet Take 1 mg by mouth at bedtime. 12/24/16  Yes [provider]  divalproex (DEPAKOTE) 250 MG DR tablet Take 500 mg by mouth 2 (two) times daily.   Yes [provider]  docusate sodium (COLACE) 100 MG capsule Take 100 mg by mouth 2 (two) times daily. Reported on 07/23/2015   Yes [provider]  ENSURE PLUS (ENSURE PLUS) LIQD Take 237 mLs by mouth 2 (two) times daily between meals.   Yes [provider]  escitalopram (LEXAPRO) 10 MG tablet  Take 10 mg by mouth daily.   Yes [provider]  lacosamide (VIMPAT) 50 MG TABS tablet Take 1 tablet (50 mg total) by mouth 2 (two) times daily. 10/18/16  Yes Jaffe, Adam R, DO  LINZESS 290 MCG CAPS capsule Take 290 mcg by mouth daily. 11/20/16  Yes [provider]  metoprolol tartrate (LOPRESSOR) 25 MG tablet Take 12.5 mg by mouth 2 (two) times daily.   Yes [provider]  ondansetron (ZOFRAN) 4 MG tablet Take 1 tablet (4 mg  total) by mouth every 6 (six) hours. Patient taking differently: Take 4 mg by mouth every 8 (eight) hours as needed for nausea or vomiting.  12/10/13  Yes Junious Silk, PA-C  oxcarbazepine (TRILEPTAL) 600 MG tablet TAKE 1 TABLET BY MOUTH TWICE A DAY 10/14/16  Yes Jaffe, Adam R, DO  pantoprazole (PROTONIX) 40 MG tablet Take 40 mg by mouth daily as needed (for heartburn).    Yes [provider]  QUEtiapine (SEROQUEL) 100 MG tablet Take 100-200 mg by mouth 3 (three) times daily. Take  by mouth at 10am,  by mouth at 1400, and  by mouth at bedtime   Yes [provider]  topiramate (TOPAMAX) 100 MG tablet TAKE 1 TABLET BY MOUTH TWICE A DAY 09/22/16  Yes Drema Dallas, DO    Family History Family History  Problem Relation Age of Onset  . Hypercholesterolemia Mother   . Hypertension Mother   . Congestive Heart Failure Brother        Deceased age 24    Social History Social History  Substance Use Topics  . Smoking status: Current Some Day Smoker    Packs/day: 0.25    Years: 20.00    Types: Cigarettes  . Smokeless tobacco: Never Used  . Alcohol use 0.0 oz/week     Allergies   Ativan [lorazepam] and Lactulose   Review of Systems Review of Systems  All systems reviewed and negative, other than as noted in HPI.  Physical Exam Updated Vital Signs BP 97/69 (BP Location: Right Arm)   Pulse (!) 105   Temp (!) 97.5 F (36.4 C) (Oral)   Resp 18   Wt 51.8 kg (114 lb 3.2 oz)   SpO2 94%   BMI 18.43 kg/m   Physical Exam  Constitutional: He appears well-developed and well-nourished. No distress.  cachectic  HENT:  Head: Normocephalic and atraumatic.  Eyes: Conjunctivae are normal. Right eye exhibits no discharge. Left eye exhibits no discharge.  Neck: Neck supple.  Cardiovascular: Normal rate, regular rhythm and normal heart sounds.  Exam reveals no gallop and no friction rub.   No murmur heard. Pulmonary/Chest: Effort normal and breath sounds  normal. No respiratory distress.  Abdominal: Soft. He exhibits no distension. There is no tenderness.  Musculoskeletal: He exhibits no edema or tenderness.  Neurological: He is alert.  Expressive aphasia. Responds to questions but I cannot clearly understand him.   Skin: Skin is warm and dry.  Psychiatric: He has a normal mood and affect. His behavior is normal. Thought content normal.  Nursing note and vitals reviewed.    ED Treatments / Results  Labs (all labs ordered are listed, but only abnormal results are displayed) Labs Reviewed  LIPASE, BLOOD - Abnormal; Notable for the following:       Result Value   Lipase 56 (*)    All other components within normal limits  COMPREHENSIVE METABOLIC PANEL - Abnormal; Notable for the following:    Potassium <2.0 (*)  Chloride 97 (*)    Glucose, Bld 137 (*)    BUN 22 (*)    Creatinine, Ser 1.97 (*)    Calcium 8.6 (*)    ALT 12 (*)    GFR calc non Af Amer 38 (*)    GFR calc Af Amer 45 (*)    All other components within normal limits  CBC - Abnormal; Notable for the following:    RBC 4.03 (*)    HCT 38.0 (*)    MCHC 36.1 (*)    Platelets 112 (*)    All other components within normal limits  MAGNESIUM - Abnormal; Notable for the following:    Magnesium 1.6 (*)    All other components within normal limits  BASIC METABOLIC PANEL - Abnormal; Notable for the following:    Glucose, Bld 116 (*)    Creatinine, Ser 1.76 (*)    Calcium 8.7 (*)    GFR calc non Af Amer 44 (*)    GFR calc Af Amer 51 (*)    All other components within normal limits  CBC - Abnormal; Notable for the following:    RBC 3.38 (*)    Hemoglobin 11.5 (*)    HCT 33.2 (*)    Platelets 98 (*)    All other components within normal limits  BASIC METABOLIC PANEL - Abnormal; Notable for the following:    Creatinine, Ser 1.43 (*)    Calcium 8.6 (*)    GFR calc non Af Amer 57 (*)    All other components within normal limits  BASIC METABOLIC PANEL - Abnormal; Notable  for the following:    Sodium 146 (*)    Chloride 119 (*)    Calcium 8.3 (*)    Anion gap 4 (*)    All other components within normal limits  URINALYSIS, ROUTINE W REFLEX MICROSCOPIC  LIPASE, BLOOD    EKG  EKG Interpretation  Date/Time:  Thursday December 30 2016 13:03:46 EDT Ventricular Rate:  73 PR Interval:    QRS Duration: 102 QT Interval:  477 QTC Calculation: 526 R Axis:   72 Text Interpretation:  Sinus rhythm Multiform ventricular premature complexes Borderline T wave abnormalities Prolonged QT interval Confirmed by Raeford Razor [phone]) on 12/30/2016 2:23:43 PM       Radiology No results found.  Procedures Procedures (including critical care time)  Medications Ordered in ED Medications  potassium chloride 10 mEq in 100 mL IVPB (10 mEq Intravenous New Bag/Given 12/30/16 1400)  ondansetron (ZOFRAN) injection 4 mg (4 mg Intravenous Given 12/30/16 1358)  sodium chloride 0.9 % bolus 1,000 mL (0 mLs Intravenous Stopped 12/30/16 1354)  potassium chloride 20 MEQ/15ML (10%) solution 40 mEq (40 mEq Oral Given 12/30/16 1353)  magnesium sulfate IVPB 2 g 50 mL (0 g Intravenous Stopped 12/30/16 1847)  potassium chloride 10 mEq in 100 mL IVPB (0 mEq Intravenous Stopped 12/31/16 0026)  sodium chloride 0.9 % bolus 250 mL (250 mLs Intravenous New Bag/Given 12/31/16 0616)  sodium chloride 0.9 % bolus 1,000 mL (0 mLs Intravenous Stopped 12/31/16 1200)     Initial Impression / Assessment and Plan / ED Course  I have reviewed the triage vital signs and the nursing notes.  Pertinent labs & imaging results that were available during my care of the patient were reviewed by me and considered in my medical decision making (see chart for details).     46 year old male with anorexia for the past 2 weeks. Progress weight loss. History of TBI makes  it difficult to get much further history. Significant hypokalemia with potassium less than 2. Admit  Final Clinical Impressions(s) / ED Diagnoses   Final  diagnoses:  Vomiting  Hypokalemia Dysphagia  New Prescriptions Discharge Medication List as of 01/01/2017  4:21 PM       Raeford Razor, MD 01/16/17 [phone]

## 2017-02-16 ENCOUNTER — Telehealth: Payer: Self-pay | Admitting: Neurology

## 2017-02-16 NOTE — Telephone Encounter (Signed)
Spoke with Hector [name], he feels like either something needs to be added or increased to help with the shaking. Advsd him I will be returning the call tomorrow.

## 2017-02-16 NOTE — Telephone Encounter (Signed)
Patient's dad called regarding him shaking a lot and unable to take his medication due to shaking so bad. He has been shaking for about a week and a half. His Dad said that he was on three different Seizure medications. Please Call. Thanks

## 2017-02-17 NOTE — Telephone Encounter (Signed)
Attempted to reach Pts father, call rang several times, then disconnected, will try again later

## 2017-02-17 NOTE — Telephone Encounter (Signed)
My suspicion is that the shaking may be due to anxiety rather than seizure.  I would like his parents to record the shaking on their phone and bring it to the office so I may look at it.

## 2017-02-22 NOTE — Telephone Encounter (Signed)
Called and spoke with Hector [name], he said he will try to record an episode and bring it in soon.

## 2017-02-25 ENCOUNTER — Inpatient Hospital Stay (HOSPITAL_COMMUNITY)
Admission: EM | Admit: 2017-02-25 | Discharge: 2017-02-28 | DRG: 391 | Disposition: A | Discharge status: Home Health | Payer: Medicaid Other | Attending: Internal Medicine | Admitting: Internal Medicine

## 2017-02-25 ENCOUNTER — Emergency Department (HOSPITAL_COMMUNITY): Payer: Medicaid Other

## 2017-02-25 ENCOUNTER — Encounter (HOSPITAL_COMMUNITY): Payer: Self-pay | Admitting: Emergency Medicine

## 2017-02-25 DIAGNOSIS — R627 Adult failure to thrive: Secondary | ICD-10-CM | POA: Diagnosis present

## 2017-02-25 DIAGNOSIS — Z79899 Other long term (current) drug therapy: Secondary | ICD-10-CM

## 2017-02-25 DIAGNOSIS — Z888 Allergy status to other drugs, medicaments and biological substances status: Secondary | ICD-10-CM

## 2017-02-25 DIAGNOSIS — Z87820 Personal history of traumatic brain injury: Secondary | ICD-10-CM

## 2017-02-25 DIAGNOSIS — A419 Sepsis, unspecified organism: Secondary | ICD-10-CM

## 2017-02-25 DIAGNOSIS — F17210 Nicotine dependence, cigarettes, uncomplicated: Secondary | ICD-10-CM | POA: Diagnosis present

## 2017-02-25 DIAGNOSIS — E43 Unspecified severe protein-calorie malnutrition: Secondary | ICD-10-CM | POA: Diagnosis present

## 2017-02-25 DIAGNOSIS — E876 Hypokalemia: Secondary | ICD-10-CM | POA: Diagnosis not present

## 2017-02-25 DIAGNOSIS — E860 Dehydration: Secondary | ICD-10-CM | POA: Diagnosis present

## 2017-02-25 DIAGNOSIS — Z681 Body mass index (BMI) 19 or less, adult: Secondary | ICD-10-CM

## 2017-02-25 DIAGNOSIS — R9431 Abnormal electrocardiogram [ECG] [EKG]: Secondary | ICD-10-CM | POA: Diagnosis not present

## 2017-02-25 DIAGNOSIS — R4701 Aphasia: Secondary | ICD-10-CM | POA: Diagnosis not present

## 2017-02-25 DIAGNOSIS — R1110 Vomiting, unspecified: Secondary | ICD-10-CM

## 2017-02-25 DIAGNOSIS — R6510 Systemic inflammatory response syndrome (SIRS) of non-infectious origin without acute organ dysfunction: Secondary | ICD-10-CM

## 2017-02-25 DIAGNOSIS — S098XXA Other specified injuries of head, initial encounter: Secondary | ICD-10-CM | POA: Diagnosis not present

## 2017-02-25 DIAGNOSIS — N179 Acute kidney failure, unspecified: Secondary | ICD-10-CM | POA: Diagnosis present

## 2017-02-25 DIAGNOSIS — K219 Gastro-esophageal reflux disease without esophagitis: Secondary | ICD-10-CM | POA: Diagnosis present

## 2017-02-25 DIAGNOSIS — R680 Hypothermia, not associated with low environmental temperature: Secondary | ICD-10-CM | POA: Diagnosis present

## 2017-02-25 DIAGNOSIS — A084 Viral intestinal infection, unspecified: Principal | ICD-10-CM | POA: Diagnosis present

## 2017-02-25 DIAGNOSIS — R5381 Other malaise: Secondary | ICD-10-CM

## 2017-02-25 DIAGNOSIS — G40909 Epilepsy, unspecified, not intractable, without status epilepticus: Secondary | ICD-10-CM | POA: Diagnosis present

## 2017-02-25 DIAGNOSIS — I4581 Long QT syndrome: Secondary | ICD-10-CM | POA: Diagnosis present

## 2017-02-25 DIAGNOSIS — Z86718 Personal history of other venous thrombosis and embolism: Secondary | ICD-10-CM

## 2017-02-25 DIAGNOSIS — F329 Major depressive disorder, single episode, unspecified: Secondary | ICD-10-CM | POA: Diagnosis present

## 2017-02-25 LAB — COMPREHENSIVE METABOLIC PANEL
ALK PHOS: 106 U/L (ref 38–126)
ALT: 9 U/L — ABNORMAL LOW (ref 17–63)
ANION GAP: 12 (ref 5–15)
AST: 18 U/L (ref 15–41)
Albumin: 4 g/dL (ref 3.5–5.0)
BUN: 20 mg/dL (ref 6–20)
CALCIUM: 8.9 mg/dL (ref 8.9–10.3)
CO2: 27 mmol/L (ref 22–32)
Chloride: 101 mmol/L (ref 101–111)
Creatinine, Ser: 2.13 mg/dL — ABNORMAL HIGH (ref 0.61–1.24)
GFR, EST AFRICAN AMERICAN: 41 mL/min — AB (ref >60)
GFR, EST NON AFRICAN AMERICAN: 35 mL/min — AB (ref >60)
Glucose, Bld: 153 mg/dL — ABNORMAL HIGH (ref 65–99)
Potassium: 2.3 mmol/L — CL (ref 3.5–5.1)
SODIUM: 140 mmol/L (ref 135–145)
Total Bilirubin: 0.7 mg/dL (ref 0.3–1.2)
Total Protein: 7 g/dL (ref 6.5–8.1)

## 2017-02-25 LAB — LIPASE, BLOOD: LIPASE: 35 U/L (ref 11–51)

## 2017-02-25 LAB — URINALYSIS, ROUTINE W REFLEX MICROSCOPIC
BACTERIA UA: NONE SEEN
Bilirubin Urine: NEGATIVE
Glucose, UA: NEGATIVE mg/dL
HGB URINE DIPSTICK: NEGATIVE
KETONES UR: NEGATIVE mg/dL
NITRITE: NEGATIVE
PROTEIN: NEGATIVE mg/dL
Specific Gravity, Urine: 1.01 (ref 1.005–1.030)
Squamous Epithelial / LPF: NONE SEEN
pH: 6 (ref 5.0–8.0)

## 2017-02-25 LAB — CBC
HCT: 37.5 % — ABNORMAL LOW (ref 39.0–52.0)
HEMOGLOBIN: 12.9 g/dL — AB (ref 13.0–17.0)
MCH: 34.2 pg — ABNORMAL HIGH (ref 26.0–34.0)
MCHC: 34.4 g/dL (ref 30.0–36.0)
MCV: 99.5 fL (ref 78.0–100.0)
PLATELETS: 113 10*3/uL — AB (ref 150–400)
RBC: 3.77 MIL/uL — AB (ref 4.22–5.81)
RDW: 13.8 % (ref 11.5–15.5)
WBC: 7.4 10*3/uL (ref 4.0–10.5)

## 2017-02-25 LAB — VALPROIC ACID LEVEL

## 2017-02-25 LAB — I-STAT CG4 LACTIC ACID, ED
Lactic Acid, Venous: 0.92 mmol/L (ref 0.5–1.9)
Lactic Acid, Venous: 0.6 mmol/L (ref 0.5–1.9)

## 2017-02-25 LAB — AMMONIA: Ammonia: 22 umol/L (ref 9–35)

## 2017-02-25 LAB — MAGNESIUM: MAGNESIUM: 1.5 mg/dL — AB (ref 1.7–2.4)

## 2017-02-25 MED ORDER — POTASSIUM CHLORIDE 10 MEQ/100ML IV SOLN
10.0000 meq | Freq: Once | INTRAVENOUS | Status: AC
  Administered 2017-02-25: 10 meq via INTRAVENOUS
  Filled 2017-02-25: qty 100

## 2017-02-25 MED ORDER — POTASSIUM CHLORIDE 10 MEQ/100ML IV SOLN
10.0000 meq | INTRAVENOUS | Status: AC
  Administered 2017-02-25 (×4): 10 meq via INTRAVENOUS
  Filled 2017-02-25 (×4): qty 100

## 2017-02-25 MED ORDER — MAGNESIUM SULFATE 50 % IJ SOLN: 1.5000 g | Freq: Once | INTRAMUSCULAR | Status: DC

## 2017-02-25 MED ORDER — PANTOPRAZOLE SODIUM 40 MG PO TBEC
40.0000 mg | DELAYED_RELEASE_TABLET | Freq: Every day | ORAL | Status: DC
  Administered 2017-02-26 – 2017-02-28 (×3): 40 mg via ORAL
  Filled 2017-02-25 (×3): qty 1

## 2017-02-25 MED ORDER — ENOXAPARIN SODIUM 40 MG/0.4ML ~~LOC~~ SOLN
40.0000 mg | SUBCUTANEOUS | Status: DC
  Administered 2017-02-25 – 2017-02-27 (×3): 40 mg via SUBCUTANEOUS
  Filled 2017-02-25 (×3): qty 0.4

## 2017-02-25 MED ORDER — PIPERACILLIN-TAZOBACTAM 3.375 G IVPB 30 MIN
3.3750 g | Freq: Once | INTRAVENOUS | Status: AC
  Administered 2017-02-25: 3.375 g via INTRAVENOUS
  Filled 2017-02-25: qty 50

## 2017-02-25 MED ORDER — POTASSIUM CHLORIDE 20 MEQ/15ML (10%) PO SOLN
40.0000 meq | Freq: Once | ORAL | Status: DC
  Filled 2017-02-25: qty 30

## 2017-02-25 MED ORDER — QUETIAPINE FUMARATE 100 MG PO TABS
100.0000 mg | ORAL_TABLET | Freq: Two times a day (BID) | ORAL | Status: DC
  Administered 2017-02-26 – 2017-02-28 (×6): 100 mg via ORAL
  Filled 2017-02-25 (×6): qty 1

## 2017-02-25 MED ORDER — BROMOCRIPTINE MESYLATE 2.5 MG PO TABS
5.0000 mg | ORAL_TABLET | Freq: Two times a day (BID) | ORAL | Status: DC
  Administered 2017-02-25 – 2017-02-28 (×6): 5 mg via ORAL
  Filled 2017-02-25 (×6): qty 2

## 2017-02-25 MED ORDER — SODIUM CHLORIDE 0.9 % IV BOLUS (SEPSIS)
500.0000 mL | Freq: Once | INTRAVENOUS | Status: AC
  Administered 2017-02-25: 500 mL via INTRAVENOUS

## 2017-02-25 MED ORDER — QUETIAPINE FUMARATE 200 MG PO TABS
200.0000 mg | ORAL_TABLET | Freq: Every day | ORAL | Status: DC
  Administered 2017-02-25 – 2017-02-27 (×3): 200 mg via ORAL
  Filled 2017-02-25 (×3): qty 1

## 2017-02-25 MED ORDER — THIAMINE HCL 100 MG/ML IJ SOLN
100.0000 mg | Freq: Once | INTRAMUSCULAR | Status: AC
  Administered 2017-02-25: 100 mg via INTRAVENOUS
  Filled 2017-02-25: qty 2

## 2017-02-25 MED ORDER — ESCITALOPRAM OXALATE 10 MG PO TABS
10.0000 mg | ORAL_TABLET | Freq: Every day | ORAL | Status: DC
  Administered 2017-02-26 – 2017-02-28 (×3): 10 mg via ORAL
  Filled 2017-02-25 (×3): qty 1

## 2017-02-25 MED ORDER — LACTATED RINGERS IV SOLN
INTRAVENOUS | Status: AC
  Administered 2017-02-25: 23:00:00 via INTRAVENOUS

## 2017-02-25 MED ORDER — TOPIRAMATE 100 MG PO TABS
100.0000 mg | ORAL_TABLET | Freq: Two times a day (BID) | ORAL | Status: DC
  Administered 2017-02-25 – 2017-02-28 (×6): 100 mg via ORAL
  Filled 2017-02-25 (×6): qty 1

## 2017-02-25 MED ORDER — POLYETHYLENE GLYCOL 3350 17 G PO PACK
17.0000 g | PACK | Freq: Every day | ORAL | Status: DC
  Administered 2017-02-26 – 2017-02-28 (×2): 17 g via ORAL
  Filled 2017-02-25 (×3): qty 1

## 2017-02-25 MED ORDER — SODIUM CHLORIDE 0.9 % IV BOLUS (SEPSIS)
1000.0000 mL | Freq: Once | INTRAVENOUS | Status: AC
  Administered 2017-02-25: 1000 mL via INTRAVENOUS

## 2017-02-25 MED ORDER — LINACLOTIDE 145 MCG PO CAPS
290.0000 ug | ORAL_CAPSULE | Freq: Every day | ORAL | Status: DC
  Administered 2017-02-26 – 2017-02-28 (×3): 290 ug via ORAL
  Filled 2017-02-25 (×3): qty 2

## 2017-02-25 MED ORDER — QUETIAPINE FUMARATE 100 MG PO TABS: 100.0000 mg | ORAL_TABLET | Freq: Three times a day (TID) | ORAL | Status: DC

## 2017-02-25 MED ORDER — LACOSAMIDE 50 MG PO TABS
50.0000 mg | ORAL_TABLET | Freq: Two times a day (BID) | ORAL | Status: DC
  Administered 2017-02-25 – 2017-02-28 (×6): 50 mg via ORAL
  Filled 2017-02-25 (×6): qty 1

## 2017-02-25 MED ORDER — OXCARBAZEPINE 300 MG PO TABS
600.0000 mg | ORAL_TABLET | Freq: Two times a day (BID) | ORAL | Status: DC
  Administered 2017-02-25 – 2017-02-28 (×6): 600 mg via ORAL
  Filled 2017-02-25 (×6): qty 2

## 2017-02-25 MED ORDER — CLONAZEPAM 1 MG PO TABS
1.0000 mg | ORAL_TABLET | Freq: Every day | ORAL | Status: DC
  Administered 2017-02-25 – 2017-02-27 (×3): 1 mg via ORAL
  Filled 2017-02-25 (×3): qty 1

## 2017-02-25 MED ORDER — SODIUM CHLORIDE 0.9 % IV BOLUS (SEPSIS)
250.0000 mL | Freq: Once | INTRAVENOUS | Status: AC
  Administered 2017-02-25: 250 mL via INTRAVENOUS

## 2017-02-25 MED ORDER — VANCOMYCIN HCL IN DEXTROSE 1-5 GM/200ML-% IV SOLN
1000.0000 mg | Freq: Once | INTRAVENOUS | Status: AC
  Administered 2017-02-25: 1000 mg via INTRAVENOUS
  Filled 2017-02-25: qty 200

## 2017-02-25 MED ORDER — MAGNESIUM SULFATE 2 GM/50ML IV SOLN
2.0000 g | Freq: Once | INTRAVENOUS | Status: AC
  Administered 2017-02-25: 2 g via INTRAVENOUS
  Filled 2017-02-25: qty 50

## 2017-02-25 MED ORDER — AMANTADINE HCL 100 MG PO CAPS
100.0000 mg | ORAL_CAPSULE | Freq: Two times a day (BID) | ORAL | Status: DC
  Administered 2017-02-25 – 2017-02-28 (×6): 100 mg via ORAL
  Filled 2017-02-25 (×6): qty 1

## 2017-02-25 NOTE — ED Notes (Signed)
Pt refusing bair hugger. Pt temperature increased to 97.71F. Took off Lawyerbair hugger and put on regular blankets.

## 2017-02-25 NOTE — Progress Notes (Signed)
A consult was received from an ED physician for vanc/Zosyn per pharmacy dosing.  The patient's profile has been reviewed for ht/wt/allergies/indication/available labs.   A one time order has been placed for vanc 1g and Zosyn 3.375g.  Further antibiotics/pharmacy consults should be ordered by admitting physician if indicated.                       Thank you, Berkley HarveyLegge, Chany Woolworth Marshall 02/25/2017  1:35 PM

## 2017-02-25 NOTE — H&P (Addendum)
History and Physical  Hector [name] ZOX:[id number] DOB: [birth year] DOA: 02/25/2017  Referring physician: EDP PCP: Ulyess Blossom, MD   Chief Complaint: Nausea vomiting, progressive weakness  HPI: Hector [name] is a 46 y.o. male   History of traumatic brain injury, seizure, expressive aphasia is brought to the ED due to generalized weakness, decreased appetite dehydration reported vomiting at home and one episode of diarrhea,   ED course: On presentation he was found hypothermia temperature 94.5, blood pressure  low normal  , sbp around low 100, no tachycardia, no hypoxia.  CBC showed mild anemia, hemoglobin 12.9 ,mild thrombocytopenia, platelet 113 which is close to baseline.  BMP is significant for hypokalemia potassium 2.3, hypomagnesemia magnesium 1.5, elevated BUN 20 and creatinine creatinine 2.13, normal lactic acid.  UA remarkable chest x-ray and KUB unremarkable. Due to hypothermia, there is a concern of infection, blood culture done in the ED, he is given Vanco and Zosyn in the ED.  hospitalist called to further manage the patient.   Patient is aphasic, does not seem to be in distress, not able to provide detailed history.  Review of Systems:  Detail per HPI, Review of systems are otherwise negative  Past Medical History:  Diagnosis Date  . Cirrhosis (HCC)   . Depression   . DVT (deep venous thrombosis) (HCC)    UE, LE DVT during hospitalization  . GERD (gastroesophageal reflux disease)   . H/O ETOH abuse   . H/O renal calculi   . Seizure (HCC)   . TBI (traumatic brain injury) (HCC)    07/2013, fall from standing post seizure   Past Surgical History:  Procedure Laterality Date  . BRAIN SURGERY    . GASTROSTOMY TUBE PLACEMENT    . REMOVAL OF GASTROSTOMY TUBE    . TRACHEOSTOMY     feinstein   Social History:  reports that he has been smoking Cigarettes.  He has a 5.00 pack-year smoking history. He has never used smokeless tobacco. He reports that he drinks  alcohol. He reports that he does not use drugs. Patient lives at home , dependent for ADL's  Allergies  Allergen Reactions  . Ativan [Lorazepam] Other (See Comments)    Pt has paradoxical effect with sedating medications.   . Lactulose Hives    Family History  Problem Relation Age of Onset  . Hypercholesterolemia Mother   . Hypertension Mother   . Congestive Heart Failure Brother        Deceased age 68      Prior to Admission medications   Medication Sig Start Date End Date Taking? Authorizing Provider  amantadine (SYMMETREL) 100 MG capsule Take 100 mg by mouth 2 (two) times daily.    [provider]  bromocriptine (PARLODEL) 2.5 MG tablet Take 5 mg by mouth 2 (two) times daily.    [provider]  clonazePAM (KLONOPIN) 1 MG tablet Take 1 mg by mouth at bedtime. 12/24/16   [provider]  divalproex (DEPAKOTE) 250 MG DR tablet Take 500 mg by mouth 2 (two) times daily.    [provider]  docusate sodium (COLACE) 100 MG capsule Take 100 mg by mouth 2 (two) times daily. Reported on 07/23/2015    [provider]  ENSURE PLUS (ENSURE PLUS) LIQD Take 237 mLs by mouth 2 (two) times daily between meals.    [provider]  escitalopram (LEXAPRO) 10 MG tablet Take 10 mg by mouth daily.    [provider]  lacosamide (  VIMPAT) 50 MG TABS tablet Take 1 tablet (50 mg total) by mouth 2 (two) times daily. 10/18/16   Drema Dallas, DO  LINZESS 290 MCG CAPS capsule Take 290 mcg by mouth daily. 11/20/16   [provider]  metoprolol tartrate (LOPRESSOR) 25 MG tablet Take 12.5 mg by mouth 2 (two) times daily.    [provider]  ondansetron (ZOFRAN) 4 MG tablet Take 1 tablet (4 mg total) by mouth every 6 (six) hours. Patient taking differently: Take 4 mg by mouth every 8 (eight) hours as needed for nausea or vomiting.  12/10/13   Junious Silk, PA-C  oxcarbazepine (TRILEPTAL) 600 MG tablet TAKE 1 TABLET BY MOUTH TWICE A DAY  10/14/16   Jaffe, Adam R, DO  pantoprazole (PROTONIX) 40 MG tablet Take 40 mg by mouth daily as needed (for heartburn).     [provider]  QUEtiapine (SEROQUEL) 100 MG tablet Take 100-200 mg by mouth 3 (three) times daily. Take 100mg  by mouth at 10am, 100mg  by mouth at 1400, and 200mg  by mouth at bedtime    [provider]  topiramate (TOPAMAX) 100 MG tablet TAKE 1 TABLET BY MOUTH TWICE A DAY 09/22/16   Drema Dallas, DO    Physical Exam: BP 94/63   Pulse 77   Temp (!) 97.4 F (36.3 C) (Oral)   Resp (!) 21   Wt 51.7 kg (114 lb)   SpO2 100%   BMI 18.40 kg/m   General:  Frail, chronically ill, thin, alert, aphasic Eyes: PERRL ENT: unremarkable Neck: supple, no JVD Cardiovascular: RRR Respiratory: CTABL Abdomen: soft/ND/ND, positive bowel sounds Skin: no rash Musculoskeletal:  No edema Psychiatric: calm/cooperative Neurologic: alert, aphasic, not following commands          Labs on Admission:  Basic Metabolic Panel:  Recent Labs Lab 02/25/17 1225 02/25/17 1415  NA 140  --   K 2.3*  --   CL 101  --   CO2 27  --   GLUCOSE 153*  --   BUN 20  --   CREATININE 2.13*  --   CALCIUM 8.9  --   MG  --  1.5*   Liver Function Tests:  Recent Labs Lab 02/25/17 1225  AST 18  ALT 9*  ALKPHOS 106  BILITOT 0.7  PROT 7.0  ALBUMIN 4.0    Recent Labs Lab 02/25/17 1225  LIPASE 35    Recent Labs Lab 02/25/17 1415  AMMONIA 22   CBC:  Recent Labs Lab 02/25/17 1225  WBC 7.4  HGB 12.9*  HCT 37.5*  MCV 99.5  PLT 113*   Cardiac Enzymes: No results for input(s): CKTOTAL, CKMB, CKMBINDEX, TROPONINI in the last 168 hours.  BNP (last 3 results) No results for input(s): BNP in the last 8760 hours.  ProBNP (last 3 results) No results for input(s): PROBNP in the last 8760 hours.  CBG: No results for input(s): GLUCAP in the last 168 hours.  Radiological Exams on Admission: Ct Head Wo Contrast  Result Date: 02/25/2017 CLINICAL DATA:  Initial  evaluation for acute altered mental status. EXAM: CT HEAD WITHOUT CONTRAST TECHNIQUE: Contiguous axial images were obtained from the base of the skull through the vertex without intravenous contrast. COMPARISON:  Prior MRI from 02/13/2015. FINDINGS: Brain: Chronic encephalomalacia within the left frontotemporal region again seen, stable from previous. Associated ex vacuo dilatation of the left lateral ventricle, also unchanged. No acute intracranial hemorrhage. No evidence for acute large vessel territory infarct. No mass lesion or  midline shift. No mass effect. No appreciable extra-axial fluid collection. Vascular: No worrisome hyperdense vessel. Skull: Scalp soft tissues demonstrate no acute abnormality. Postoperative changes from prior left frontal craniotomy noted. No acute calvarial fracture. Sinuses/Orbits: Globes and orbital soft tissues within normal limits. Visualized paranasal sinuses and mastoid air cells are clear. Other: None. IMPRESSION: 1. No acute intracranial process. 2. Chronic left frontotemporal encephalomalacia with associated ex vacuo dilatation of the left lateral ventricle, stable. 3. Sequelae of prior left frontal craniotomy, unchanged. Electronically Signed   By: Rise MuBenjamin  McClintock M.D.   On: 02/25/2017 15:14   Dg Chest Port 1 View  Result Date: 02/25/2017 CLINICAL DATA:  Low-grade fever.  Altered mental status. EXAM: PORTABLE CHEST 1 VIEW COMPARISON:  Chest x-ray dated June 01, 2015. FINDINGS: The cardiomediastinal silhouette is normal in size. Normal pulmonary vascularity. Stable scarring of the peripheral left lower lung. No focal consolidation, pleural effusion, or pneumothorax. No acute osseous abnormality. IMPRESSION: No active disease. Electronically Signed   By: Obie DredgeWilliam T Derry M.D.   On: 02/25/2017 14:49    EKG: Independently reviewed.  Sinus rhythm, no acute ST-T changes, QTC prolonged at 638  Assessment/Plan Present on Admission: **None**  Nausea/  vomiting -multiple documentation regarding history of oropharyngeal dysphagia and esophageal dysphagia and  prior history of vomiting that required ED visit -No active nausea vomiting in the ED, KUB unremarkable -Keep n.p.o. except ice chips and sips with meds,  continue PPI, PRN antiemetics  Dehydration/ hypokalemia/ hypomagnesemia /acute renal failure/ low normal bp Hold home meds Lasix /spironolactone ( not sure why patient is on these meds, though there is documentation of h/o cirrhosis), hold home meds lopressor.  - continue IV hydration ,replace K and mag.  QTC prolongation Due to electrolyte abnormality Keep K greater than 4, mag greater than 2, keep on telemetry, repeat EKG in a.m.  Hypothermia likely due to dehydration, poor nutrition status.  Body temperature is improved with Bair hugger. Patient has no fever ,no leukocytosis, lactic acid within normal limits. ua and cxr unremarkable. will hold off on further antibiotics, f/u on culture result Check am cortisol level.   history of traumatic brain injury and seizure, continue home meds seizure precaution  Severe malnutrition, nutrition consult  Failure to thrive : PT eval    DVT prophylaxis: lovenox  Consultants: none  Code Status: full   Family Communication:  Patient   Disposition Plan: med tele, obs  Time spent: 75mins  Lalisa Kiehn MD, PhD [facility] Pager [phone] If 7PM-7AM, please contact night-coverage at [email or web address], password [email or web address]

## 2017-02-25 NOTE — ED Provider Notes (Signed)
[facility]-EMERGENCY DEPT Provider Note   CSN: [id number] Arrival date & time: 02/25/17  1205   Level 5 caveat patient noncommunicative history is obtained from patient's father  History   Chief Complaint Chief Complaint  Patient presents with  . Emesis    HPI Hector [name] is a 46 y.o. male.  Patient has had diminished appetite and has had generalized weakness progressively worsening over the past 2 weeks.  Today he was too weak to get up from bed.  He had 5 episodes of vomiting today and one episode of diarrhea.  Diarrhea and emesis were nonbloody.  He has had no cough.  No other associated symptoms.  No treatment prior to coming here.  Brought by EMS received saline 500 mL bolus prior to coming here  HPI  Past Medical History:  Diagnosis Date  . Cirrhosis (HCC)   . Depression   . DVT (deep venous thrombosis) (HCC)    UE, LE DVT during hospitalization  . GERD (gastroesophageal reflux disease)   . H/O ETOH abuse   . H/O renal calculi   . Seizure (HCC)   . TBI (traumatic brain injury) (HCC)    07/2013, fall from standing post seizure    Patient Active Problem List   Diagnosis Date Noted  . Oropharyngeal dysphagia 01/01/2017  . Orthostatic hypotension 01/01/2017  . Failure to thrive in adult 12/31/2016  . Vomiting   . Seizure (HCC) 06/01/2015  . Alcoholic cirrhosis of liver without ascites (HCC)   . Dehydration   . Encephalopathy acute 02/13/2015  . Altered mental status   . Ataxia 02/12/2015  . Localization-related symptomatic epilepsy and epileptic syndromes with complex partial seizures, not intractable, without status epilepticus (HCC) 08/16/2014  . Tobacco abuse 08/16/2014  . Acute kidney injury superimposed on CKD (HCC) 06/25/2014  . Dysphagia 06/25/2014  . Centrilobular emphysema (HCC) 01/27/2014  . Tracheostomy in place [facility]) 01/27/2014  . Encounter for family conference with patient present 01/23/2014  . Dvt femoral (deep venous  thrombosis) (HCC) 01/05/2014  . Protein-calorie malnutrition, severe (HCC) 01/05/2014  . Alcohol abuse 01/05/2014  . Acute respiratory failure with hypoxia (HCC) 12/20/2013  . Delirium due to general medical condition 12/13/2013  . HCAP (healthcare-associated pneumonia) 12/13/2013  . Sepsis (HCC) 12/13/2013  . TBI (traumatic brain injury) (HCC) 12/12/2013    Past Surgical History:  Procedure Laterality Date  . BRAIN SURGERY    . GASTROSTOMY TUBE PLACEMENT    . REMOVAL OF GASTROSTOMY TUBE    . TRACHEOSTOMY     feinstein       Home Medications    Prior to Admission medications   Medication Sig Start Date End Date Taking? Authorizing Provider  amantadine (SYMMETREL) 100 MG capsule Take 100 mg by mouth 2 (two) times daily.    [provider]  bromocriptine (PARLODEL) 2.5 MG tablet Take 5 mg by mouth 2 (two) times daily.    [provider]  clonazePAM (KLONOPIN) 1 MG tablet Take 1 mg by mouth at bedtime. 12/24/16   [provider]  divalproex (DEPAKOTE) 250 MG DR tablet Take 500 mg by mouth 2 (two) times daily.    [provider]  docusate sodium (COLACE) 100 MG capsule Take 100 mg by mouth 2 (two) times daily. Reported on 07/23/2015    [provider]  ENSURE PLUS (ENSURE PLUS) LIQD Take 237 mLs by mouth 2 (two) times daily between meals.    [provider]  escitalopram (LEXAPRO) 10 MG tablet  Take 10 mg by mouth daily.    [provider]  lacosamide (VIMPAT) 50 MG TABS tablet Take 1 tablet (50 mg total) by mouth 2 (two) times daily. 10/18/16   Drema Dallas, DO  LINZESS 290 MCG CAPS capsule Take 290 mcg by mouth daily. 11/20/16   [provider]  metoprolol tartrate (LOPRESSOR) 25 MG tablet Take 12.5 mg by mouth 2 (two) times daily.    [provider]  ondansetron (ZOFRAN) 4 MG tablet Take 1 tablet (4 mg total) by mouth every 6 (six) hours. Patient taking differently: Take 4 mg by mouth every 8 (eight)  hours as needed for nausea or vomiting.  12/10/13   Junious Silk, PA-C  oxcarbazepine (TRILEPTAL) 600 MG tablet TAKE 1 TABLET BY MOUTH TWICE A DAY 10/14/16   Jaffe, Adam R, DO  pantoprazole (PROTONIX) 40 MG tablet Take 40 mg by mouth daily as needed (for heartburn).     [provider]  QUEtiapine (SEROQUEL) 100 MG tablet Take 100-200 mg by mouth 3 (three) times daily. Take 100mg  by mouth at 10am, 100mg  by mouth at 1400, and 200mg  by mouth at bedtime    [provider]  topiramate (TOPAMAX) 100 MG tablet TAKE 1 TABLET BY MOUTH TWICE A DAY 09/22/16   Drema Dallas, DO    Family History Family History  Problem Relation Age of Onset  . Hypercholesterolemia Mother   . Hypertension Mother   . Congestive Heart Failure Brother        Deceased age 4    Social History Social History  Substance Use Topics  . Smoking status: Current Some Day Smoker    Packs/day: 0.25    Years: 20.00    Types: Cigarettes  . Smokeless tobacco: Never Used  . Alcohol use 0.0 oz/week     Allergies   Ativan [lorazepam] and Lactulose   Review of Systems Review of Systems  Unable to perform ROS: Other  Gastrointestinal: Positive for diarrhea and vomiting.  Neurological: Positive for weakness.  Noncommunicative since  brain injury years ago  Physical Exam Updated Vital Signs BP 104/63   Pulse 74   Temp (!) 94.5 F (34.7 C) (Rectal)   Resp 16   Wt 51.7 kg (114 lb)   SpO2 96%   BMI 18.40 kg/m   Physical Exam  Constitutional:  Chronically ill-appearing  HENT:  Head: Normocephalic and atraumatic.  Mucous membranes dry.  No facial asymmetry  Eyes: Pupils are equal, round, and reactive to light. Conjunctivae are normal.  Neck: Neck supple. No tracheal deviation present. No thyromegaly present.  No signs of meningitis  Cardiovascular: Normal rate and regular rhythm.   No murmur heard. Pulmonary/Chest: Effort normal and breath sounds normal.  Abdominal: Soft. Bowel sounds are  normal. He exhibits no distension. There is no tenderness.  Genitourinary: Penis normal.  Genitourinary Comments: Normal male genitalia  Musculoskeletal: Normal range of motion. He exhibits no edema or tenderness.  Neurological: He is alert. Coordination normal.  Simple commands moves all extremities.n=  Skin: Skin is warm and dry. Capillary refill takes less than 2 seconds. No rash noted.  Psychiatric: He has a normal mood and affect.  Nursing note and vitals reviewed.    ED Treatments / Results  Labs (all labs ordered are listed, but only abnormal results are displayed) Labs Reviewed  COMPREHENSIVE METABOLIC PANEL - Abnormal; Notable for the following:       Result Value   Potassium 2.3 (*)    Glucose,  Bld 153 (*)    Creatinine, Ser 2.13 (*)    ALT 9 (*)    GFR calc non Af Amer 35 (*)    GFR calc Af Amer 41 (*)    All other components within normal limits  CBC - Abnormal; Notable for the following:    RBC 3.77 (*)    Hemoglobin 12.9 (*)    HCT 37.5 (*)    MCH 34.2 (*)    Platelets 113 (*)    All other components within normal limits  CULTURE, BLOOD (ROUTINE X 2)  CULTURE, BLOOD (ROUTINE X 2)  LIPASE, BLOOD  VALPROIC ACID LEVEL  MAGNESIUM  I-STAT CG4 LACTIC ACID, ED    EKG  EKG Interpretation  Date/Time:  Friday February 25 2017 13:28:41 EDT Ventricular Rate:  70 PR Interval:    QRS Duration: 97 QT Interval:  591 QTC Calculation: 638 R Axis:   86 Text Interpretation:  Sinus rhythm Prolonged QT interval No significant change since last tracing Confirmed by Doug Sou [phone]) on 02/25/2017 2:18:33 PM      Results for orders placed or performed during the hospital encounter of 02/25/17  Lipase, blood  Result Value Ref Range   Lipase 35 11 - 51 U/L  Comprehensive metabolic panel  Result Value Ref Range   Sodium 140 135 - 145 mmol/L   Potassium 2.3 (LL) 3.5 - 5.1 mmol/L   Chloride 101 101 - 111 mmol/L   CO2 27 22 - 32 mmol/L   Glucose, Bld 153 (H) 65 -  99 mg/dL   BUN 20 6 - 20 mg/dL   Creatinine, Ser 5.62 (H) 0.61 - 1.24 mg/dL   Calcium 8.9 8.9 - 13.0 mg/dL   Total Protein 7.0 6.5 - 8.1 g/dL   Albumin 4.0 3.5 - 5.0 g/dL   AST 18 15 - 41 U/L   ALT 9 (L) 17 - 63 U/L   Alkaline Phosphatase 106 38 - 126 U/L   Total Bilirubin 0.7 0.3 - 1.2 mg/dL   GFR calc non Af Amer 35 (L) >60 mL/min   GFR calc Af Amer 41 (L) >60 mL/min   Anion gap 12 5 - 15  CBC  Result Value Ref Range   WBC 7.4 4.0 - 10.5 K/uL   RBC 3.77 (L) 4.22 - 5.81 MIL/uL   Hemoglobin 12.9 (L) 13.0 - 17.0 g/dL   HCT 86.5 (L) 78.4 - 69.6 %   MCV 99.5 78.0 - 100.0 fL   MCH 34.2 (H) 26.0 - 34.0 pg   MCHC 34.4 30.0 - 36.0 g/dL   RDW 29.5 28.4 - 13.2 %   Platelets 113 (L) 150 - 400 K/uL  Valproic acid level  Result Value Ref Range   Valproic Acid Lvl <10 (L) 50.0 - 100.0 ug/mL  Magnesium  Result Value Ref Range   Magnesium 1.5 (L) 1.7 - 2.4 mg/dL  Ammonia  Result Value Ref Range   Ammonia 22 9 - 35 umol/L  I-Stat CG4 Lactic Acid, ED  (not at  [facility])  Result Value Ref Range   Lactic Acid, Venous 0.92 0.5 - 1.9 mmol/L   Ct Head Wo Contrast  Result Date: 02/25/2017 CLINICAL DATA:  Initial evaluation for acute altered mental status. EXAM: CT HEAD WITHOUT CONTRAST TECHNIQUE: Contiguous axial images were obtained from the base of the skull through the vertex without intravenous contrast. COMPARISON:  Prior MRI from 02/13/2015. FINDINGS: Brain: Chronic encephalomalacia within the left frontotemporal region again seen, stable from previous. Associated ex  vacuo dilatation of the left lateral ventricle, also unchanged. No acute intracranial hemorrhage. No evidence for acute large vessel territory infarct. No mass lesion or midline shift. No mass effect. No appreciable extra-axial fluid collection. Vascular: No worrisome hyperdense vessel. Skull: Scalp soft tissues demonstrate no acute abnormality. Postoperative changes from prior left frontal craniotomy noted. No acute calvarial  fracture. Sinuses/Orbits: Globes and orbital soft tissues within normal limits. Visualized paranasal sinuses and mastoid air cells are clear. Other: None. IMPRESSION: 1. No acute intracranial process. 2. Chronic left frontotemporal encephalomalacia with associated ex vacuo dilatation of the left lateral ventricle, stable. 3. Sequelae of prior left frontal craniotomy, unchanged. Electronically Signed   By: Rise Mu M.D.   On: 02/25/2017 15:14   Dg Chest Port 1 View  Result Date: 02/25/2017 CLINICAL DATA:  Low-grade fever.  Altered mental status. EXAM: PORTABLE CHEST 1 VIEW COMPARISON:  Chest x-ray dated June 01, 2015. FINDINGS: The cardiomediastinal silhouette is normal in size. Normal pulmonary vascularity. Stable scarring of the peripheral left lower lung. No focal consolidation, pleural effusion, or pneumothorax. No acute osseous abnormality. IMPRESSION: No active disease. Electronically Signed   By: Obie Dredge M.D.   On: 02/25/2017 14:49   Dg Abd 2 Views  Result Date: 02/25/2017 CLINICAL DATA:  N/V/D today; smoker EXAM: ABDOMEN - 2 VIEW COMPARISON:  12/30/2016 FINDINGS: Normal bowel gas pattern.  No free air. Mild increased stool in the rectosigmoid. Generalized increased bowel gas partly obscures the soft tissues. Allowing for this, no evidence of renal or ureteral stones. No soft tissue abnormality. Skeletal structures are unremarkable. IMPRESSION: 1. No acute findings.  No evidence of bowel obstruction or free air. Electronically Signed   By: Amie Portland M.D.   On: 02/25/2017 16:26   Radiology No results found.  Procedures Procedures (including critical care time)  Medications Ordered in ED Medications  piperacillin-tazobactam (ZOSYN) IVPB 3.375 g (not administered)  vancomycin (VANCOCIN) IVPB 1000 mg/200 mL premix (not administered)  sodium chloride 0.9 % bolus 1,000 mL (not administered)    And  sodium chloride 0.9 % bolus 500 mL (not administered)    And  sodium  chloride 0.9 % bolus 250 mL (not administered)  potassium chloride 10 mEq in 100 mL IVPB (not administered)  thiamine (B-1) injection 100 mg (not administered)     Initial Impression / Assessment and Plan / ED Course  I have reviewed the triage vital signs and the nursing notes.  Pertinent labs & imaging results that were available during my care of the patient were reviewed by me and considered in my medical decision making (see chart for details).    Code sepsis called based on hypothermia.  Source of infection unclear At 3:40 PM patient is more alert and interactive after treatment with intravenous fluids and antibiotics and with passive rewarming withBair huugar.  Hospitalist physician Dr.Xu Myrtie Neither will arrange for overnight stay to telemetry.  Intravenous magnesium and intravenous potassium supplementation ordered.  Worsening renal function and acute kidney injury likely secondary to dehydration Sepsis - Repeat Assessment  Performed at:    340 pm  Vitals     Blood pressure 94/63, pulse 77, temperature (!) 97.4 F (36.3 C), temperature source Oral, resp. rate (!) 21, weight 51.7 kg (114 lb), SpO2 100 %.  Heart:     Regular rate and rhythm  Lungs:    CTA  Capillary Refill:   <2 sec  Peripheral Pulse:   Radial pulse palpable  Skin:     Normal  Color   Final Clinical Impressions(s) / ED Diagnoses  Diagnosis #1 sepsis #2 hypothermia #3 vomiting and diarrhea #4 acute kidney injury #5 hypokalemia #6 hypomagnesemia CRITICAL CARE Performed by: Doug SouJACUBOWITZ,Vernida Mcnicholas Total critical care time: 35 minutes Critical care time was exclusive of separately billable procedures and treating other patients. Critical care was necessary to treat or prevent imminent or life-threatening deterioration. Critical care was time spent personally by me on the following activities: development of treatment plan with patient and/or surrogate as well as nursing, discussions with consultants, evaluation of  patient's response to treatment, examination of patient, obtaining history from patient or surrogate, ordering and performing treatments and interventions, ordering and review of laboratory studies, ordering and review of radiographic studies, pulse oximetry and re-evaluation of patient's condition. Final diagnoses:  None    New Prescriptions New Prescriptions   No medications on file     Doug SouJacubowitz, Marx Doig, MD 02/25/17 [phone]

## 2017-02-25 NOTE — ED Notes (Signed)
Patient transported to X-ray 

## 2017-02-25 NOTE — ED Notes (Signed)
Pt will be going to room 1409 once the room has been cleaned. Please call Cristy for report [phone] at 18:40

## 2017-02-25 NOTE — ED Notes (Signed)
Bed: [id number] Expected date:  Expected time:  Means of arrival:  Comments: 46 yo m n/v

## 2017-02-25 NOTE — ED Notes (Signed)
ED TO INPATIENT HANDOFF REPORT  Name/Age/Gender Hector [name] 46 y.o. male  Code Status Code Status History    Date Active Date Inactive Code Status Order ID Comments User Context   12/30/2016  2:39 PM 01/01/2017  7:41 PM Full Code [id number]  Rosita Fire, MD ED   06/01/2015 11:51 PM 06/02/2015  4:01 PM Full Code [id number]  Gennaro Africa, MD ED   02/13/2015  2:07 AM 02/15/2015  5:20 PM Full Code [id number]  Toy Baker, MD Inpatient   06/25/2014 11:39 PM 06/27/2014  7:34 PM Full Code [id number]  Rise Patience, MD Inpatient   12/13/2013  8:56 AM 01/03/2014  3:54 PM Full Code [id number]  Toy Baker, MD Inpatient   12/12/2013 12:29 AM 12/13/2013  8:56 AM Full Code [id number]  [name], [name] ED      Home/SNF/Other Home  Chief Complaint Nausea; Emesis  Level of Care/Admitting Diagnosis ED Disposition    ED Disposition Condition [place]: [facility] [[id number]]  Level of Care: Telemetry [5]  Admit to tele based on following criteria: Complex arrhythmia (Bradycardia/Tachycardia)  Diagnosis: FTT (failure to thrive) in adult [637858]  Admitting Physician: Florencia Reasons [[id number]]  Attending Physician: Florencia Reasons [[id number]]  PT Class (Do Not Modify): Observation [104]  PT Acc Code (Do Not Modify): Observation [10022]       Medical History Past Medical History:  Diagnosis Date  . Cirrhosis ([place])   . Depression   . DVT (deep venous thrombosis) ([facility])    UE, LE DVT during hospitalization  . GERD (gastroesophageal reflux disease)   . H/O ETOH abuse   . H/O renal calculi   . Seizure ([place])   . 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 Ativan [Lorazepam] Other (See Comments)    Pt has paradoxical effect with sedating medications.   . Lactulose Hives    IV Location/Drains/Wounds Patient Lines/Drains/Airways Status   Active  Line/Drains/Airways    Name:   Placement date:   Placement time:   Site:   Days:   Peripheral IV 02/25/17 Left Antecubital  02/25/17        Antecubital    less than 1   Peripheral IV 02/25/17 Right Antecubital  02/25/17    1415    Antecubital    less than 1          Labs/Imaging Results for orders placed or performed during the hospital encounter of 02/25/17 (from the past 48 hour(s))  Lipase, blood     Status: None   Collection Time: 02/25/17 12:25 PM  Result Value Ref Range   Lipase 35 11 - 51 U/L  Comprehensive metabolic panel     Status: Abnormal   Collection Time: 02/25/17 12:25 PM  Result Value Ref Range   Sodium 140 135 - 145 mmol/L   Potassium 2.3 (LL) 3.5 - 5.1 mmol/L    Comment: CRITICAL RESULT CALLED TO, READ BACK BY AND VERIFIED WITH: M.QUICK RN [id number] A.QUIZON    Chloride 101 101 - 111 mmol/L   CO2 27 22 - 32 mmol/L   Glucose, Bld 153 (H) 65 - 99 mg/dL   BUN 20 6 - 20 mg/dL   Creatinine, Ser 2.13 (H) 0.61 - 1.24 mg/dL   Calcium 8.9 8.9 - 10.3 mg/dL   Total Protein 7.0 6.5 - 8.1 g/dL   Albumin 4.0 3.5 - 5.0  g/dL   AST 18 15 - 41 U/L   ALT 9 (L) 17 - 63 U/L   Alkaline Phosphatase 106 38 - 126 U/L   Total Bilirubin 0.7 0.3 - 1.2 mg/dL   GFR calc non Af Amer 35 (L) >60 mL/min   GFR calc Af Amer 41 (L) >60 mL/min    Comment: (NOTE) The eGFR has been calculated using the CKD EPI equation. This calculation has not been validated in all clinical situations. eGFR's persistently <60 mL/min signify possible Chronic Kidney Disease.    Anion gap 12 5 - 15  CBC     Status: Abnormal   Collection Time: 02/25/17 12:25 PM  Result Value Ref Range   WBC 7.4 4.0 - 10.5 K/uL   RBC 3.77 (L) 4.22 - 5.81 MIL/uL   Hemoglobin 12.9 (L) 13.0 - 17.0 g/dL   HCT 37.5 (L) 39.0 - 52.0 %   MCV 99.5 78.0 - 100.0 fL   MCH 34.2 (H) 26.0 - 34.0 pg   MCHC 34.4 30.0 - 36.0 g/dL   RDW 13.8 11.5 - 15.5 %   Platelets 113 (L) 150 - 400 K/uL    Comment: SPECIMEN CHECKED FOR  CLOTS REPEATED TO VERIFY PLATELET COUNT CONFIRMED BY SMEAR   Valproic acid level     Status: Abnormal   Collection Time: 02/25/17  2:15 PM  Result Value Ref Range   Valproic Acid Lvl <10 (L) 50.0 - 100.0 ug/mL    Comment: RESULTS CONFIRMED BY MANUAL DILUTION  Magnesium     Status: Abnormal   Collection Time: 02/25/17  2:15 PM  Result Value Ref Range   Magnesium 1.5 (L) 1.7 - 2.4 mg/dL  Ammonia     Status: None   Collection Time: 02/25/17  2:15 PM  Result Value Ref Range   Ammonia 22 9 - 35 umol/L  I-Stat CG4 Lactic Acid, ED  (not at  [facility])     Status: None   Collection Time: 02/25/17  2:31 PM  Result Value Ref Range   Lactic Acid, Venous 0.92 0.5 - 1.9 mmol/L  Urinalysis, Routine w reflex microscopic     Status: Abnormal   Collection Time: 02/25/17  4:55 PM  Result Value Ref Range   Color, Urine YELLOW YELLOW   APPearance CLEAR CLEAR   Specific Gravity, Urine 1.010 1.005 - 1.030   pH 6.0 5.0 - 8.0   Glucose, UA NEGATIVE NEGATIVE mg/dL   Hgb urine dipstick NEGATIVE NEGATIVE   Bilirubin Urine NEGATIVE NEGATIVE   Ketones, ur NEGATIVE NEGATIVE mg/dL   Protein, ur NEGATIVE NEGATIVE mg/dL   Nitrite NEGATIVE NEGATIVE   Leukocytes, UA TRACE (A) NEGATIVE   RBC / HPF 0-5 0 - 5 RBC/hpf   WBC, UA 0-5 0 - 5 WBC/hpf   Bacteria, UA NONE SEEN NONE SEEN   Squamous Epithelial / LPF NONE SEEN NONE SEEN   Hyaline Casts, UA PRESENT    Ct Head Wo Contrast  Result Date: 02/25/2017 CLINICAL DATA:  Initial evaluation for acute altered mental status. EXAM: CT HEAD WITHOUT CONTRAST TECHNIQUE: Contiguous axial images were obtained from the base of the skull through the vertex without intravenous contrast. COMPARISON:  Prior MRI from 02/13/2015. FINDINGS: Brain: Chronic encephalomalacia within the left frontotemporal region again seen, stable from previous. Associated ex vacuo dilatation of the left lateral ventricle, also unchanged. No acute intracranial hemorrhage. No evidence for acute large vessel  territory infarct. No mass lesion or midline shift. No mass effect. No appreciable extra-axial fluid collection.  Vascular: No worrisome hyperdense vessel. Skull: Scalp soft tissues demonstrate no acute abnormality. Postoperative changes from prior left frontal craniotomy noted. No acute calvarial fracture. Sinuses/Orbits: Globes and orbital soft tissues within normal limits. Visualized paranasal sinuses and mastoid air cells are clear. Other: None. IMPRESSION: 1. No acute intracranial process. 2. Chronic left frontotemporal encephalomalacia with associated ex vacuo dilatation of the left lateral ventricle, stable. 3. Sequelae of prior left frontal craniotomy, unchanged. Electronically Signed   By: Jeannine Boga M.D.   On: 02/25/2017 15:14   Dg Chest Port 1 View  Result Date: 02/25/2017 CLINICAL DATA:  Low-grade fever.  Altered mental status. EXAM: PORTABLE CHEST 1 VIEW COMPARISON:  Chest x-ray dated June 01, 2015. FINDINGS: The cardiomediastinal silhouette is normal in size. Normal pulmonary vascularity. Stable scarring of the peripheral left lower lung. No focal consolidation, pleural effusion, or pneumothorax. No acute osseous abnormality. IMPRESSION: No active disease. Electronically Signed   By: Titus Dubin M.D.   On: 02/25/2017 14:49   Dg Abd 2 Views  Result Date: 02/25/2017 CLINICAL DATA:  N/V/D today; smoker EXAM: ABDOMEN - 2 VIEW COMPARISON:  12/30/2016 FINDINGS: Normal bowel gas pattern.  No free air. Mild increased stool in the rectosigmoid. Generalized increased bowel gas partly obscures the soft tissues. Allowing for this, no evidence of renal or ureteral stones. No soft tissue abnormality. Skeletal structures are unremarkable. IMPRESSION: 1. No acute findings.  No evidence of bowel obstruction or free air. Electronically Signed   By: Lajean Manes M.D.   On: 02/25/2017 16:26    Pending Labs [employer]    Start     Ordered   02/25/17 1331  Blood Culture (routine x 2)   BLOOD CULTURE X 2,   STAT     02/25/17 1332   Signed and Held  HIV antibody (Routine Testing)  Once,   R     Signed and Held   Signed and Held  CBC  (enoxaparin (LOVENOX)    CrCl >/= 30 ml/min)  Once,   R    Comments:  Baseline for enoxaparin therapy IF NOT ALREADY DRAWN.  Notify MD if PLT < 100 K.    Signed and Held   Signed and Held  Creatinine, serum  (enoxaparin (LOVENOX)    CrCl >/= 30 ml/min)  Once,   R    Comments:  Baseline for enoxaparin therapy IF NOT ALREADY DRAWN.    Signed and Held   Signed and Held  Creatinine, serum  (enoxaparin (LOVENOX)    CrCl >/= 30 ml/min)  Weekly,   R    Comments:  while on enoxaparin therapy    Signed and Held   Signed and Held  CBC  Tomorrow morning,   R     Signed and Held   Signed and Held  Comprehensive metabolic panel  Tomorrow morning,   R     Signed and Held   Signed and Held  Magnesium  Tomorrow morning,   R     Signed and Held      Vitals/Pain Today's Vitals   02/25/17 1445 02/25/17 1515 02/25/17 1545 02/25/17 1600  BP: 98/65 95/66 94/63  100/63  Pulse:    71  Resp: 19 18 (!) 21 15  Temp:      TempSrc:      SpO2:    100%  Weight:        Isolation Precautions No active isolations  Medications Medications  piperacillin-tazobactam (ZOSYN) IVPB 3.375 g (0 g Intravenous Stopped  02/25/17 1456)  vancomycin (VANCOCIN) IVPB 1000 mg/200 mL premix (0 mg Intravenous Stopped 02/25/17 1526)  sodium chloride 0.9 % bolus 1,000 mL (0 mLs Intravenous Stopped 02/25/17 1432)    And  sodium chloride 0.9 % bolus 500 mL (0 mLs Intravenous Stopped 02/25/17 1429)    And  sodium chloride 0.9 % bolus 250 mL (0 mLs Intravenous Stopped 02/25/17 1424)  potassium chloride 10 mEq in 100 mL IVPB (0 mEq Intravenous Stopped 02/25/17 1505)  thiamine (B-1) injection 100 mg (100 mg Intravenous Given 02/25/17 1405)  magnesium sulfate IVPB 2 g 50 mL (0 g Intravenous Stopped 02/25/17 1659)    Mobility bedbound

## 2017-02-25 NOTE — ED Triage Notes (Signed)
Per EMS. Pt reports n/v since 0600 with a small amount of diarrhea. No blood in emesis or stool. Pt lives with father due to hx of TBI (pt axO 4x, slurred speech at baseline). EMS gave 500ml NS and 4mg  zofran en route. Pt still feels nauseated.

## 2017-02-25 NOTE — ED Notes (Signed)
K+ is 2.3, MD Freida BusmanAllen and RN Keslar notified

## 2017-02-25 NOTE — ED Notes (Signed)
Patient has urinal at bedside. Patient is aware we need urine.

## 2017-02-26 ENCOUNTER — Encounter (HOSPITAL_COMMUNITY): Payer: Self-pay

## 2017-02-26 DIAGNOSIS — R112 Nausea with vomiting, unspecified: Secondary | ICD-10-CM | POA: Diagnosis not present

## 2017-02-26 DIAGNOSIS — R6510 Systemic inflammatory response syndrome (SIRS) of non-infectious origin without acute organ dysfunction: Secondary | ICD-10-CM | POA: Diagnosis present

## 2017-02-26 DIAGNOSIS — R627 Adult failure to thrive: Secondary | ICD-10-CM

## 2017-02-26 DIAGNOSIS — Z79899 Other long term (current) drug therapy: Secondary | ICD-10-CM | POA: Diagnosis not present

## 2017-02-26 DIAGNOSIS — Z888 Allergy status to other drugs, medicaments and biological substances status: Secondary | ICD-10-CM | POA: Diagnosis not present

## 2017-02-26 DIAGNOSIS — N179 Acute kidney failure, unspecified: Secondary | ICD-10-CM

## 2017-02-26 DIAGNOSIS — R197 Diarrhea, unspecified: Secondary | ICD-10-CM

## 2017-02-26 DIAGNOSIS — R569 Unspecified convulsions: Secondary | ICD-10-CM | POA: Diagnosis not present

## 2017-02-26 DIAGNOSIS — K219 Gastro-esophageal reflux disease without esophagitis: Secondary | ICD-10-CM | POA: Diagnosis present

## 2017-02-26 DIAGNOSIS — Z681 Body mass index (BMI) 19 or less, adult: Secondary | ICD-10-CM | POA: Diagnosis not present

## 2017-02-26 DIAGNOSIS — E876 Hypokalemia: Secondary | ICD-10-CM | POA: Diagnosis present

## 2017-02-26 DIAGNOSIS — A084 Viral intestinal infection, unspecified: Secondary | ICD-10-CM | POA: Diagnosis present

## 2017-02-26 DIAGNOSIS — E43 Unspecified severe protein-calorie malnutrition: Secondary | ICD-10-CM | POA: Diagnosis present

## 2017-02-26 DIAGNOSIS — Z86718 Personal history of other venous thrombosis and embolism: Secondary | ICD-10-CM | POA: Diagnosis not present

## 2017-02-26 DIAGNOSIS — Z8719 Personal history of other diseases of the digestive system: Secondary | ICD-10-CM

## 2017-02-26 DIAGNOSIS — I4581 Long QT syndrome: Secondary | ICD-10-CM | POA: Diagnosis present

## 2017-02-26 DIAGNOSIS — G40909 Epilepsy, unspecified, not intractable, without status epilepticus: Secondary | ICD-10-CM | POA: Diagnosis present

## 2017-02-26 DIAGNOSIS — F329 Major depressive disorder, single episode, unspecified: Secondary | ICD-10-CM | POA: Diagnosis present

## 2017-02-26 DIAGNOSIS — R5381 Other malaise: Secondary | ICD-10-CM | POA: Diagnosis not present

## 2017-02-26 DIAGNOSIS — E860 Dehydration: Secondary | ICD-10-CM | POA: Diagnosis present

## 2017-02-26 DIAGNOSIS — F17210 Nicotine dependence, cigarettes, uncomplicated: Secondary | ICD-10-CM | POA: Diagnosis present

## 2017-02-26 DIAGNOSIS — R680 Hypothermia, not associated with low environmental temperature: Secondary | ICD-10-CM | POA: Diagnosis present

## 2017-02-26 DIAGNOSIS — Z87820 Personal history of traumatic brain injury: Secondary | ICD-10-CM | POA: Diagnosis not present

## 2017-02-26 LAB — BLOOD CULTURE ID PANEL (REFLEXED)
Acinetobacter baumannii: NOT DETECTED
CANDIDA ALBICANS: NOT DETECTED
CANDIDA GLABRATA: NOT DETECTED
CANDIDA PARAPSILOSIS: NOT DETECTED
CANDIDA TROPICALIS: NOT DETECTED
Candida krusei: NOT DETECTED
ENTEROBACTERIACEAE SPECIES: NOT DETECTED
Enterobacter cloacae complex: NOT DETECTED
Enterococcus species: NOT DETECTED
Escherichia coli: NOT DETECTED
HAEMOPHILUS INFLUENZAE: NOT DETECTED
KLEBSIELLA OXYTOCA: NOT DETECTED
KLEBSIELLA PNEUMONIAE: NOT DETECTED
Listeria monocytogenes: NOT DETECTED
METHICILLIN RESISTANCE: NOT DETECTED
Neisseria meningitidis: NOT DETECTED
Proteus species: NOT DETECTED
Pseudomonas aeruginosa: NOT DETECTED
STREPTOCOCCUS PYOGENES: NOT DETECTED
Serratia marcescens: NOT DETECTED
Staphylococcus aureus (BCID): NOT DETECTED
Staphylococcus species: DETECTED — AB
Streptococcus agalactiae: NOT DETECTED
Streptococcus pneumoniae: NOT DETECTED
Streptococcus species: NOT DETECTED

## 2017-02-26 LAB — CBC
HCT: 30.9 % — ABNORMAL LOW (ref 39.0–52.0)
Hemoglobin: 10.4 g/dL — ABNORMAL LOW (ref 13.0–17.0)
MCH: 33.8 pg (ref 26.0–34.0)
MCHC: 33.7 g/dL (ref 30.0–36.0)
MCV: 100.3 fL — ABNORMAL HIGH (ref 78.0–100.0)
PLATELETS: 90 10*3/uL — AB (ref 150–400)
RBC: 3.08 MIL/uL — AB (ref 4.22–5.81)
RDW: 14 % (ref 11.5–15.5)
WBC: 8.4 10*3/uL (ref 4.0–10.5)

## 2017-02-26 LAB — COMPREHENSIVE METABOLIC PANEL
ALBUMIN: 2.9 g/dL — AB (ref 3.5–5.0)
ALT: 8 U/L — AB (ref 17–63)
ANION GAP: 5 (ref 5–15)
AST: 14 U/L — ABNORMAL LOW (ref 15–41)
Alkaline Phosphatase: 83 U/L (ref 38–126)
BUN: 16 mg/dL (ref 6–20)
CHLORIDE: 110 mmol/L (ref 101–111)
CO2: 26 mmol/L (ref 22–32)
CREATININE: 1.33 mg/dL — AB (ref 0.61–1.24)
Calcium: 8.2 mg/dL — ABNORMAL LOW (ref 8.9–10.3)
GFR calc non Af Amer: >60 mL/min (ref >60)
Glucose, Bld: 100 mg/dL — ABNORMAL HIGH (ref 65–99)
Potassium: 2.7 mmol/L — CL (ref 3.5–5.1)
SODIUM: 141 mmol/L (ref 135–145)
TOTAL PROTEIN: 5.4 g/dL — AB (ref 6.5–8.1)
Total Bilirubin: 0.8 mg/dL (ref 0.3–1.2)

## 2017-02-26 LAB — CORTISOL: Cortisol, Plasma: 7.6 ug/dL

## 2017-02-26 LAB — MAGNESIUM: MAGNESIUM: 2 mg/dL (ref 1.7–2.4)

## 2017-02-26 MED ORDER — POTASSIUM CHLORIDE CRYS ER 20 MEQ PO TBCR
40.0000 meq | EXTENDED_RELEASE_TABLET | ORAL | Status: AC
  Administered 2017-02-26 (×2): 40 meq via ORAL
  Filled 2017-02-26 (×2): qty 2

## 2017-02-26 MED ORDER — LACTATED RINGERS IV SOLN
INTRAVENOUS | Status: DC
  Administered 2017-02-26: 23:00:00 via INTRAVENOUS

## 2017-02-26 MED ORDER — SODIUM CHLORIDE 0.9 % IV SOLN
INTRAVENOUS | Status: DC
  Administered 2017-02-26: 11:00:00 via INTRAVENOUS
  Filled 2017-02-26 (×4): qty 1000

## 2017-02-26 NOTE — Progress Notes (Signed)
CRITICAL VALUE ALERT  Critical Value:  K 2.7  Date & Time Notied:  11/3 [id number]  Provider Notified: Burnadette PeterLynch  Orders Received/Actions taken:

## 2017-02-26 NOTE — Progress Notes (Signed)
PHARMACY - PHYSICIAN COMMUNICATION CRITICAL VALUE ALERT - BLOOD CULTURE IDENTIFICATION (BCID)  Results for orders placed or performed during the hospital encounter of 02/25/17  Blood Culture ID Panel (Reflexed) (Collected: 02/25/2017  2:15 PM)  Result Value Ref Range   Enterococcus species NOT DETECTED NOT DETECTED   Listeria monocytogenes NOT DETECTED NOT DETECTED   Staphylococcus species DETECTED (A) NOT DETECTED   Staphylococcus aureus NOT DETECTED NOT DETECTED   Methicillin resistance NOT DETECTED NOT DETECTED   Streptococcus species NOT DETECTED NOT DETECTED   Streptococcus agalactiae NOT DETECTED NOT DETECTED   Streptococcus pneumoniae NOT DETECTED NOT DETECTED   Streptococcus pyogenes NOT DETECTED NOT DETECTED   Acinetobacter baumannii NOT DETECTED NOT DETECTED   Enterobacteriaceae species NOT DETECTED NOT DETECTED   Enterobacter cloacae complex NOT DETECTED NOT DETECTED   Escherichia coli NOT DETECTED NOT DETECTED   Klebsiella oxytoca NOT DETECTED NOT DETECTED   Klebsiella pneumoniae NOT DETECTED NOT DETECTED   Proteus species NOT DETECTED NOT DETECTED   Serratia marcescens NOT DETECTED NOT DETECTED   Haemophilus influenzae NOT DETECTED NOT DETECTED   Neisseria meningitidis NOT DETECTED NOT DETECTED   Pseudomonas aeruginosa NOT DETECTED NOT DETECTED   Candida albicans NOT DETECTED NOT DETECTED   Candida glabrata NOT DETECTED NOT DETECTED   Candida krusei NOT DETECTED NOT DETECTED   Candida parapsilosis NOT DETECTED NOT DETECTED   Candida tropicalis NOT DETECTED NOT DETECTED    Name of physician (or Provider) Contacted: texted Dr. Gwenlyn PerkingMadera (no call back).  1 of 4 bottles with staph species, not Aureus, not MR, possible contaminant.  Changes to prescribed antibiotics required: none Herby AbrahamMichelle T. Eleasha Cataldo, Pharm.D. [phone] 02/26/2017 12:00 PM

## 2017-02-26 NOTE — Evaluation (Signed)
Physical Therapy Evaluation Patient Details Name: Hector [name] MRN: [id number] DOB: 30-Sep-1970 Today's Date: 02/26/2017   History of Present Illness  46 yo male with history of traumatic brain injury, seizure, expressive aphasia is brought to the ED due to generalized weakness  Clinical Impression  Pt is likely at baseline level and should be okay to discharge to home at prior level without PT follow up     Follow Up Recommendations No PT follow up    Equipment Recommendations  None recommended by PT    Recommendations for Other Services       Precautions / Restrictions Precautions Precautions: Fall Restrictions Weight Bearing Restrictions: No      Mobility  Bed Mobility Overal bed mobility: Independent                Transfers Overall transfer level: Independent                  Ambulation/Gait Ambulation/Gait assistance: Modified independent (Device/Increase time) Ambulation Distance (Feet): 100 Feet Assistive device: Rolling walker (2 wheeled)       General Gait Details: also walked 50  feet with supervision without device and has mild increase tone in left leg and flexion in arm   Stairs            Wheelchair Mobility    Modified Rankin (Stroke Patients Only)       Balance                                             Pertinent Vitals/Pain Pain Assessment: No/denies pain    Home Living Family/patient expects to be discharged to:: Private residence Living Arrangements: Parent                    Prior Function Level of Independence: Needs assistance   Gait / Transfers Assistance Needed: unsure     Comments: Pt indicates he has a RW at home and uses it sometimes, but also sometimes walks without      Hand Dominance        Extremity/Trunk Assessment        Lower Extremity Assessment Lower Extremity Assessment: LLE deficits/detail RLE Deficits / Details:  (appeard WFL) LLE Deficits /  Details: mild clonus, appears to have mild increase in abnormal muscle tone during        Communication   Communication: Expressive difficulties  Cognition Arousal/Alertness: Awake/alert Behavior During Therapy: WFL for tasks assessed/performed                                          General Comments General comments (skin integrity, edema, etc.): Pt indicated some general fatigue, but he is likely at baseline level of mobility     Exercises     Assessment/Plan    PT Assessment Patent does not need any further PT services  PT Problem List         PT Treatment Interventions      PT Goals (Current goals can be found in the Care Plan section)       Frequency     Barriers to discharge        Co-evaluation               AM-PAC PT "  6 Clicks" Daily Activity  Outcome Measure Difficulty turning over in bed (including adjusting bedclothes, sheets and blankets)?: None Difficulty moving from lying on back to sitting on the side of the bed? : None Difficulty sitting down on and standing up from a chair with arms (e.g., wheelchair, bedside commode, etc,.)?: None Help needed moving to and from a bed to chair (including a wheelchair)?: A Little Help needed walking in hospital room?: A Little Help needed climbing 3-5 steps with a railing? : A Little 6 Click Score: 21    End of Session Equipment Utilized During Treatment: Gait belt Activity Tolerance: Patient tolerated treatment well Patient left: in bed;with bed alarm set Nurse Communication: Mobility status PT Visit Diagnosis: Unsteadiness on feet (R26.81)    Time: [id number] PT Time Calculation (min) (ACUTE ONLY): 25 min   Charges:   PT Evaluation $PT Eval Low Complexity: 1 Low     PT G Codes:   PT G-Codes **NOT FOR INPATIENT CLASS** Functional Assessment Tool Used: AM-PAC 6 Clicks Basic Mobility Functional Limitation: Mobility: Walking and moving around Mobility: Walking and Moving Around  Current Status ([id number](G8978): At least 20 percent but less than 40 percent impaired, limited or restricted Mobility: Walking and Moving Around Goal Status [phone](G8979): At least 20 percent but less than 40 percent impaired, limited or restricted Mobility: Walking and Moving Around Discharge Status [phone](G8980): At least 20 percent but less than 40 percent impaired, limited or restricted    Rosey Batheresa K. Manson PasseyBrown, PT   Donnetta HailBrown, Diquan Kassis Krall 02/26/2017, 12:59 PM

## 2017-02-26 NOTE — Progress Notes (Signed)
[facility] PROGRESS NOTE  Hector [name] ZOX:[id number] DOB: 23-May-1970 DOA: 02/25/2017 PCP: Ulyess Blossom, MD  Interim summary and HPI History of traumatic brain injury, seizure, expressive aphasia is brought to the ED due to generalized weakness, decreased appetite dehydration reported vomiting at home and  Diarrhea. Admitted due to failure to thrive and to further evaluate possibility of underlying systemic infection.  Assessment/Plan: 1-nausea, vomiting and diarrhea -most likely viral gastroenteritis -will continue IVF's and supportive care -electrolytes repletion and PRN antiemetics -no fever and normal WBC's today -CT abd and pelvis non impressive for acute abnormalities.  2-dehydration -will continue IVF"s  3-QTC prolongation  -continue monitoring on telemetry -correct/repelte electrolytes   4-hx of traumatic brain injury and seizure -no seizure appreciated -will continue home antiepileptic regimen   5-severe malnutrition  -advance diet and continue feeding supplements  6-hx of cirrhosis  -no ascites seen -BP still soft -continue holding b-blocker and diuretics '  7-acute renal failure -from dehydration; pre-renal azotemia -continue IVF's -Cr trending down   8-SIRS -resolving with supportive care and fluid resuscitation. -will follow clinical response   Code Status: Full Family Communication: no family at bedside  Disposition Plan: back home at discharge; will continue electrolytes repletion and IVF's. Advance diet and follow final culture results.    Consultants:  None   Procedures:  See below for x-ray reports   Antibiotics:  None   HPI/Subjective: Afebrile, no CP, no SOB, no nausea, no vomiting.  Objective: Vitals:   02/26/17 1900 02/26/17 2048  BP: (!) 89/54 (!) 84/57  Pulse: 65 64  Resp:  16  Temp:  98.2 F (36.8 C)  SpO2:  98%    Intake/Output Summary (Last 24 hours) at 02/26/17 2251 Last data filed at 02/26/17  1843  Gross per 24 hour  Intake              140 ml  Output              250 ml  Net             -110 ml   Filed Weights   02/25/17 1351 02/25/17 1939  Weight: 51.7 kg (114 lb) 47.9 kg (105 lb 9.6 oz)    Exam:   General: afebrile, no CP, no abd pain. Patient denies nausea and vomiting. Having loose stools overnight per nursing staff reports.  Cardiovascular: S1 and S2, no rubs, no gallosp  Respiratory: good air movement, no wheezing, no crackles  Abdomen: soft, no distension, no tenderness, positive BS  Musculoskeletal: no edema, no cyanosis, no clubbing   Data Reviewed: Basic Metabolic Panel:  Recent Labs Lab 02/25/17 1225 02/25/17 1415 02/26/17 0543  NA 140  --  141  K 2.3*  --  2.7*  CL 101  --  110  CO2 27  --  26  GLUCOSE 153*  --  100*  BUN 20  --  16  CREATININE 2.13*  --  1.33*  CALCIUM 8.9  --  8.2*  MG  --  1.5* 2.0   Liver Function Tests:  Recent Labs Lab 02/25/17 1225 02/26/17 0543  AST 18 14*  ALT 9* 8*  ALKPHOS 106 83  BILITOT 0.7 0.8  PROT 7.0 5.4*  ALBUMIN 4.0 2.9*    Recent Labs Lab 02/25/17 1225  LIPASE 35    Recent Labs Lab 02/25/17 1415  AMMONIA 22   CBC:  Recent Labs Lab 02/25/17 1225 02/26/17 0543  WBC 7.4 8.4  HGB 12.9* 10.4*  HCT 37.5* 30.9*  MCV 99.5 100.3*  PLT 113* 90*    Recent Results (from the past 240 hour(s))  Blood Culture (routine x 2)     Status: None (Preliminary result)   Collection Time: 02/25/17  1:58 PM  Result Value Ref Range Status   Specimen Description BLOOD RIGHT ANTECUBITAL  Final   Special Requests   Final    BOTTLES DRAWN AEROBIC AND ANAEROBIC Blood Culture results may not be optimal due to an excessive volume of blood received in culture bottles   Culture   Final    NO GROWTH < 24 HOURS Performed at [facility], [address], [place], [place] [address]    Report Status PENDING  Incomplete  Blood Culture (routine x 2)     Status: None (Preliminary result)   Collection  Time: 02/25/17  2:15 PM  Result Value Ref Range Status   Specimen Description BLOOD RIGHT HAND  Final   Special Requests   Final    BOTTLES DRAWN AEROBIC ONLY Blood Culture results may not be optimal due to an inadequate volume of blood received in culture bottles   Culture  Setup Time   Final    GRAM POSITIVE COCCI IN CLUSTERS AEROBIC BOTTLE ONLY CRITICAL RESULT CALLED TO, READ BACK BY AND VERIFIED WITH: Oneta Rack AT 1133 02/26/17 BY L BENFIELD Performed at [facility], [address] [place], [place] [address]    Culture GRAM POSITIVE COCCI  Final   Report Status PENDING  Incomplete  Blood Culture ID Panel (Reflexed)     Status: Abnormal   Collection Time: 02/25/17  2:15 PM  Result Value Ref Range Status   Enterococcus species NOT DETECTED NOT DETECTED Final   Listeria monocytogenes NOT DETECTED NOT DETECTED Final   Staphylococcus species DETECTED (A) NOT DETECTED Final    Comment: Methicillin (oxacillin) susceptible coagulase negative staphylococcus. Possible blood culture contaminant (unless isolated from more than one blood culture draw or clinical case suggests pathogenicity). No antibiotic treatment is indicated for blood  culture contaminants. CRITICAL RESULT CALLED TO, READ BACK BY AND VERIFIED WITH: M BELL,PHARMD AT 1133 02/26/17 BY L BENFIELD    Staphylococcus aureus NOT DETECTED NOT DETECTED Final   Methicillin resistance NOT DETECTED NOT DETECTED Final   Streptococcus species NOT DETECTED NOT DETECTED Final   Streptococcus agalactiae NOT DETECTED NOT DETECTED Final   Streptococcus pneumoniae NOT DETECTED NOT DETECTED Final   Streptococcus pyogenes NOT DETECTED NOT DETECTED Final   Acinetobacter baumannii NOT DETECTED NOT DETECTED Final   Enterobacteriaceae species NOT DETECTED NOT DETECTED Final   Enterobacter cloacae complex NOT DETECTED NOT DETECTED Final   Escherichia coli NOT DETECTED NOT DETECTED Final   Klebsiella oxytoca NOT DETECTED NOT DETECTED Final    Klebsiella pneumoniae NOT DETECTED NOT DETECTED Final   Proteus species NOT DETECTED NOT DETECTED Final   Serratia marcescens NOT DETECTED NOT DETECTED Final   Haemophilus influenzae NOT DETECTED NOT DETECTED Final   Neisseria meningitidis NOT DETECTED NOT DETECTED Final   Pseudomonas aeruginosa NOT DETECTED NOT DETECTED Final   Candida albicans NOT DETECTED NOT DETECTED Final   Candida glabrata NOT DETECTED NOT DETECTED Final   Candida krusei NOT DETECTED NOT DETECTED Final   Candida parapsilosis NOT DETECTED NOT DETECTED Final   Candida tropicalis NOT DETECTED NOT DETECTED Final    Comment: Performed at [facility], [address] [place], [place] [address]     Studies: Ct Head Wo Contrast  Result Date: 02/25/2017 CLINICAL DATA:  Initial evaluation for acute altered mental status. EXAM: CT HEAD WITHOUT CONTRAST TECHNIQUE: Contiguous axial images were obtained from the base of the skull through the vertex without intravenous contrast. COMPARISON:  Prior MRI from 02/13/2015. FINDINGS: Brain: Chronic encephalomalacia within the left frontotemporal region again seen, stable from previous. Associated ex vacuo dilatation of the left lateral ventricle, also unchanged. No acute intracranial hemorrhage. No evidence for acute large vessel territory infarct. No mass lesion or midline shift. No mass effect. No appreciable extra-axial fluid collection. Vascular: No worrisome hyperdense vessel. Skull: Scalp soft tissues demonstrate no acute abnormality. Postoperative changes from prior left frontal craniotomy noted. No acute calvarial fracture. Sinuses/Orbits: Globes and orbital soft tissues within normal limits. Visualized paranasal sinuses and mastoid air cells are clear. Other: None. IMPRESSION: 1. No acute intracranial process. 2. Chronic left frontotemporal encephalomalacia with associated ex vacuo dilatation of the left lateral ventricle, stable. 3. Sequelae of prior left frontal craniotomy,  unchanged. Electronically Signed   By: Rise MuBenjamin  McClintock M.D.   On: 02/25/2017 15:14   Dg Chest Port 1 View  Result Date: 02/25/2017 CLINICAL DATA:  Low-grade fever.  Altered mental status. EXAM: PORTABLE CHEST 1 VIEW COMPARISON:  Chest x-ray dated June 01, 2015. FINDINGS: The cardiomediastinal silhouette is normal in size. Normal pulmonary vascularity. Stable scarring of the peripheral left lower lung. No focal consolidation, pleural effusion, or pneumothorax. No acute osseous abnormality. IMPRESSION: No active disease. Electronically Signed   By: Obie DredgeWilliam T Derry M.D.   On: 02/25/2017 14:49   Dg Abd 2 Views  Result Date: 02/25/2017 CLINICAL DATA:  N/V/D today; smoker EXAM: ABDOMEN - 2 VIEW COMPARISON:  12/30/2016 FINDINGS: Normal bowel gas pattern.  No free air. Mild increased stool in the rectosigmoid. Generalized increased bowel gas partly obscures the soft tissues. Allowing for this, no evidence of renal or ureteral stones. No soft tissue abnormality. Skeletal structures are unremarkable. IMPRESSION: 1. No acute findings.  No evidence of bowel obstruction or free air. Electronically Signed   By: Amie Portlandavid  Ormond M.D.   On: 02/25/2017 16:26    Scheduled Meds: . amantadine  100 mg Oral BID  . bromocriptine  5 mg Oral BID  . clonazePAM  1 mg Oral QHS  . enoxaparin (LOVENOX) injection  40 mg Subcutaneous Q24H  . escitalopram  10 mg Oral Daily  . lacosamide  50 mg Oral BID  . linaclotide  290 mcg Oral Daily  . oxcarbazepine  600 mg Oral BID  . pantoprazole  40 mg Oral Daily  . polyethylene glycol  17 g Oral Daily  . potassium chloride  40 mEq Oral Once  . QUEtiapine  100 mg Oral BID  . QUEtiapine  200 mg Oral QHS  . topiramate  100 mg Oral BID   Continuous Infusions: . lactated ringers    . sodium chloride 0.9 % 1,000 mL with potassium chloride 40 mEq infusion 100 mL/hr at 02/26/17 2058    Active Problems:   FTT (failure to thrive) in adult    Time spent: 30  minutes    Vassie LollMadera, Matrice Herro  [facility] Pager [phone]. If 7PM-7AM, please contact night-coverage at [email or web address], password [email or web address] 02/26/2017, 10:51 PM  LOS: 0 days

## 2017-02-27 DIAGNOSIS — E860 Dehydration: Secondary | ICD-10-CM

## 2017-02-27 DIAGNOSIS — E876 Hypokalemia: Secondary | ICD-10-CM

## 2017-02-27 LAB — BASIC METABOLIC PANEL
ANION GAP: 6 (ref 5–15)
BUN: 10 mg/dL (ref 6–20)
CO2: 23 mmol/L (ref 22–32)
Calcium: 8.7 mg/dL — ABNORMAL LOW (ref 8.9–10.3)
Chloride: 117 mmol/L — ABNORMAL HIGH (ref 101–111)
Creatinine, Ser: 1.18 mg/dL (ref 0.61–1.24)
GFR calc Af Amer: >60 mL/min (ref >60)
GFR calc non Af Amer: >60 mL/min (ref >60)
GLUCOSE: 79 mg/dL (ref 65–99)
Potassium: 4 mmol/L (ref 3.5–5.1)
Sodium: 146 mmol/L — ABNORMAL HIGH (ref 135–145)

## 2017-02-27 LAB — MAGNESIUM: Magnesium: 1.8 mg/dL (ref 1.7–2.4)

## 2017-02-27 LAB — PHOSPHORUS: Phosphorus: 2.3 mg/dL — ABNORMAL LOW (ref 2.5–4.6)

## 2017-02-27 LAB — OCCULT BLOOD X 1 CARD TO LAB, STOOL: FECAL OCCULT BLD: NEGATIVE

## 2017-02-27 LAB — HIV ANTIBODY (ROUTINE TESTING W REFLEX): HIV Screen 4th Generation wRfx: NONREACTIVE

## 2017-02-27 MED ORDER — ENSURE ENLIVE PO LIQD
237.0000 mL | Freq: Three times a day (TID) | ORAL | Status: DC
  Administered 2017-02-27 – 2017-02-28 (×4): 237 mL via ORAL

## 2017-02-27 MED ORDER — POTASSIUM CHLORIDE IN NACL 40-0.9 MEQ/L-% IV SOLN
INTRAVENOUS | Status: DC
  Administered 2017-02-27 – 2017-02-28 (×3): 100 mL/h via INTRAVENOUS
  Filled 2017-02-27 (×3): qty 1000

## 2017-02-27 NOTE — Progress Notes (Signed)
[facility] PROGRESS NOTE  Hector [name] WUJ:[id number] DOB: 1970-10-14 DOA: 02/25/2017 PCP: Ulyess Blossom, MD  Interim summary and HPI History of traumatic brain injury, seizure, expressive aphasia is brought to the ED due to generalized weakness, decreased appetite dehydration reported vomiting at home and  Diarrhea. Admitted due to failure to thrive and to further evaluate possibility of underlying systemic infection.  Assessment/Plan: 1-nausea, vomiting and diarrhea -most likely viral gastroenteritis; will advance diet and if able to tolerated patient can be discharged home in the morning. -Continue IV fluids and supportive care -will continue electrolytes repletion and PRN antiemetics -no fever and normal WBC's -CT abd and pelvis non impressive for acute abnormalities.  2-dehydration -will continue IVF"s -We will now advance diet and assess patient ability to eat.  3-QTC prolongation  -continue monitoring on telemetry -correct/replete electrolytes as needed  4-hx of traumatic brain injury and seizure -no seizure appreciated -will continue home antiepileptic regimen  -Continue seizure precaution  5-severe malnutrition  -advance diet and continue feeding supplements -Appreciate assistance and recommendation from nutritional service  6-hx of cirrhosis  -no ascites seen -BP still soft -will continue holding b-blocker and diuretics   7-acute renal failure -from dehydration; pre-renal azotemia -Cr has continued trending down and is essentially back to normal -Repeat basic metabolic panel in the morning.  8-SIRS -resolved with supportive care and fluid resuscitation. -will follow clinical response  -Will fully advance his diet.  9-1 out of 4 blood culture positive -Staph coagulase-negative -Most likely a contaminant -Continue monitoring off antibiotics  Code Status: Full Family Communication: no family at bedside  Disposition Plan: back to home at  discharge; will continue electrolytes repletion and IVF's. Advance diet and follow final culture results (so far no growth, except for 1/4 blood cx's, which might represent a contaminant).    Consultants:  None   Procedures:  See below for x-ray reports   Antibiotics:  None   HPI/Subjective: No fever, no chest pain, no shortness of breath, no nausea, no vomiting.  Patient reports feeling better and would like to have his diet advanced.  Objective: Vitals:   02/27/17 0526 02/27/17 1424  BP: 92/61 (!) 83/56  Pulse: 75 (!) 57  Resp: 16 16  Temp: (!) 97.4 F (36.3 C) 98 F (36.7 C)  SpO2: 100% 98%    Intake/Output Summary (Last 24 hours) at 02/27/2017 1815 Last data filed at 02/27/2017 1425 Gross per 24 hour  Intake 3005.83 ml  Output -  Net 3005.83 ml   Filed Weights   02/25/17 1351 02/25/17 1939  Weight: 51.7 kg (114 lb) 47.9 kg (105 lb 9.6 oz)    Exam:   General: No fever, no chest pain, no shortness of breath.  Patient reports no abdominal pain.  Patient denies nausea and vomiting. No loose stools.  Cardiovascular: S1 and S2, no rubs, no gallops, no murmur.   Respiratory: Good air movement, no wheezing, no crackles, normal respiratory effort.    Abdomen: Soft, no distention, no tenderness, positive bowel sounds.    Musculoskeletal: No edema, no cyanosis, no clubbing.    Data Reviewed: Basic Metabolic Panel: Recent Labs  Lab 02/25/17 1225 02/25/17 1415 02/26/17 0543 02/27/17 0524  NA 140  --  141 146*  K 2.3*  --  2.7* 4.0  CL 101  --  110 117*  CO2 27  --  26 23  GLUCOSE 153*  --  100* 79  BUN 20  --  16 10  CREATININE 2.13*  --  1.33* 1.18  CALCIUM 8.9  --  8.2* 8.7*  MG  --  1.5* 2.0 1.8  PHOS  --   --   --  2.3*   Liver Function Tests: Recent Labs  Lab 02/25/17 1225 02/26/17 0543  AST 18 14*  ALT 9* 8*  ALKPHOS 106 83  BILITOT 0.7 0.8  PROT 7.0 5.4*  ALBUMIN 4.0 2.9*   Recent Labs  Lab 02/25/17 1225  LIPASE 35   Recent Labs   Lab 02/25/17 1415  AMMONIA 22   CBC: Recent Labs  Lab 02/25/17 1225 02/26/17 0543  WBC 7.4 8.4  HGB 12.9* 10.4*  HCT 37.5* 30.9*  MCV 99.5 100.3*  PLT 113* 90*    Recent Results (from the past 240 hour(s))  Blood Culture (routine x 2)     Status: None (Preliminary result)   Collection Time: 02/25/17  1:58 PM  Result Value Ref Range Status   Specimen Description BLOOD RIGHT ANTECUBITAL  Final   Special Requests   Final    BOTTLES DRAWN AEROBIC AND ANAEROBIC Blood Culture results may not be optimal due to an excessive volume of blood received in culture bottles   Culture   Final    NO GROWTH 2 DAYS Performed at [facility], [address] [place], [place] [id number]    Report Status PENDING  Incomplete  Blood Culture (routine x 2)     Status: Abnormal (Preliminary result)   Collection Time: 02/25/17  2:15 PM  Result Value Ref Range Status   Specimen Description BLOOD RIGHT HAND  Final   Special Requests   Final    BOTTLES DRAWN AEROBIC ONLY Blood Culture results may not be optimal due to an inadequate volume of blood received in culture bottles   Culture  Setup Time   Final    GRAM POSITIVE COCCI IN CLUSTERS AEROBIC BOTTLE ONLY CRITICAL RESULT CALLED TO, READ BACK BY AND VERIFIED WITH: Oneta RackM BELL,PHARMD AT 1133 02/26/17 BY L BENFIELD Performed at [facility], [address] [place], [place] [address]    Culture STAPHYLOCOCCUS SPECIES (COAGULASE NEGATIVE) (A)  Final   Report Status PENDING  Incomplete  Blood Culture ID Panel (Reflexed)     Status: Abnormal   Collection Time: 02/25/17  2:15 PM  Result Value Ref Range Status   Enterococcus species NOT DETECTED NOT DETECTED Final   Listeria monocytogenes NOT DETECTED NOT DETECTED Final   Staphylococcus species DETECTED (A) NOT DETECTED Final    Comment: Methicillin (oxacillin) susceptible coagulase negative staphylococcus. Possible blood culture contaminant (unless isolated from more than one blood culture draw  or clinical case suggests pathogenicity). No antibiotic treatment is indicated for blood  culture contaminants. CRITICAL RESULT CALLED TO, READ BACK BY AND VERIFIED WITH: M BELL,PHARMD AT 1133 02/26/17 BY L BENFIELD    Staphylococcus aureus NOT DETECTED NOT DETECTED Final   Methicillin resistance NOT DETECTED NOT DETECTED Final   Streptococcus species NOT DETECTED NOT DETECTED Final   Streptococcus agalactiae NOT DETECTED NOT DETECTED Final   Streptococcus pneumoniae NOT DETECTED NOT DETECTED Final   Streptococcus pyogenes NOT DETECTED NOT DETECTED Final   Acinetobacter baumannii NOT DETECTED NOT DETECTED Final   Enterobacteriaceae species NOT DETECTED NOT DETECTED Final   Enterobacter cloacae complex NOT DETECTED NOT DETECTED Final   Escherichia coli NOT DETECTED NOT DETECTED Final   Klebsiella oxytoca NOT DETECTED NOT DETECTED Final   Klebsiella pneumoniae NOT DETECTED NOT DETECTED Final   Proteus species NOT DETECTED NOT  DETECTED Final   Serratia marcescens NOT DETECTED NOT DETECTED Final   Haemophilus influenzae NOT DETECTED NOT DETECTED Final   Neisseria meningitidis NOT DETECTED NOT DETECTED Final   Pseudomonas aeruginosa NOT DETECTED NOT DETECTED Final   Candida albicans NOT DETECTED NOT DETECTED Final   Candida glabrata NOT DETECTED NOT DETECTED Final   Candida krusei NOT DETECTED NOT DETECTED Final   Candida parapsilosis NOT DETECTED NOT DETECTED Final   Candida tropicalis NOT DETECTED NOT DETECTED Final    Comment: Performed at [facility], [address] [place], [place] [address]     Studies: No results found.  Scheduled Meds: . amantadine  100 mg Oral BID  . bromocriptine  5 mg Oral BID  . clonazePAM  1 mg Oral QHS  . enoxaparin (LOVENOX) injection  40 mg Subcutaneous Q24H  . escitalopram  10 mg Oral Daily  . feeding supplement (ENSURE ENLIVE)  237 mL Oral TID BM  . lacosamide  50 mg Oral BID  . linaclotide  290 mcg Oral Daily  . oxcarbazepine  600 mg  Oral BID  . pantoprazole  40 mg Oral Daily  . polyethylene glycol  17 g Oral Daily  . potassium chloride  40 mEq Oral Once  . QUEtiapine  100 mg Oral BID  . QUEtiapine  200 mg Oral QHS  . topiramate  100 mg Oral BID   Continuous Infusions: . 0.9 % NaCl with KCl 40 mEq / L 100 mL/hr (02/27/17 1457)    Active Problems:   FTT (failure to thrive) in adult    Time spent: 30 minutes    Vassie Loll  [facility] Pager [phone]. If 7PM-7AM, please contact night-coverage at [email or web address], password [facility] 02/27/2017, 6:15 PM  LOS: 1 day

## 2017-02-27 NOTE — Progress Notes (Signed)
Initial Nutrition Assessment  DOCUMENTATION CODES:   Severe malnutrition in context of chronic illness, Underweight  INTERVENTION:   -Provide Ensure Enlive po TID, each supplement provides 350 kcal and 20 grams of protein -Switched diet order to mechanical soft diet (dysphagia 3 diet) given difficulty swallowing  If PO intake remains poor, may need to consider nutrition support.  RD will continue to monitor  NUTRITION DIAGNOSIS:   Severe Malnutrition related to dysphagia, chronic illness(h/o TBI) as evidenced by percent weight loss, severe fat depletion, moderate muscle depletion.  GOAL:   Patient will meet greater than or equal to 90% of their needs  MONITOR:   PO intake, Supplement acceptance, Weight trends, Labs  REASON FOR ASSESSMENT:   Consult Assessment of nutrition requirement/status  ASSESSMENT:   46 y.o. male History of traumatic brain injury, seizure, expressive aphasia is brought to the ED due to generalized weakness, decreased appetite dehydration reported vomiting at home and one episode of diarrhea,   Pt in room with no family at bedside. Pt not able to answer nutrition related questions. Pt able to nod head yes or no but verbally states no to every question. Nutrition ambassador brought patient a clear liquid tray and pt was unable to verbalize name and birth date.  Pt with history of difficult swallowing, last SLP evaluation in September 2018 recommended dysphagia 3 diet.  Pt consumed 15% of his clear liquid tray yesterday. Pt on a GI soft diet now, will switch to mechanical soft diet (dysphagia 3).   Per chart review, pt has lost 18 lb since 5/24 (15% wt loss x 5 months, significant for time frame).   Medications: Protonix tablet daily, Miralax packet daily Labs reviewed: Elevated Na Low Phos Mg WNL   NUTRITION - FOCUSED PHYSICAL EXAM:    Most Recent Value  Orbital Region  No depletion  Upper Arm Region  Severe depletion  Thoracic and Lumbar  Region  Unable to assess  Buccal Region  No depletion  Temple Region  Mild depletion  Clavicle Bone Region  Moderate depletion  Clavicle and Acromion Bone Region  Moderate depletion  Scapular Bone Region  Moderate depletion  Dorsal Hand  Moderate depletion  Patellar Region  Unable to assess  Anterior Thigh Region  Unable to assess  Posterior Calf Region  Unable to assess  Edema (RD Assessment)  None      Diet Order:  DIET DYS 3 Room service appropriate? Yes with Assist; Fluid consistency: Thin  EDUCATION NEEDS:   Not appropriate for education at this time  Skin:  Skin Assessment: Reviewed RN Assessment  Last BM:  11/4  Height:   Ht Readings from Last 1 Encounters:  10/22/15 5\' 6"  (1.676 m)    Weight:   Wt Readings from Last 1 Encounters:  02/25/17 105 lb 9.6 oz (47.9 kg)    Ideal Body Weight:  64.5 kg  BMI:  Body mass index is 17.04 kg/m.  Estimated Nutritional Needs:   Kcal:  1400-1600  Protein:  60-70g  Fluid:  1.6L/day  Tilda FrancoLindsey Annalycia Done, MS, RD, LDN [facility] Clinical Dietitian Pager: [phone] After Hours Pager: [phone]

## 2017-02-28 DIAGNOSIS — E876 Hypokalemia: Secondary | ICD-10-CM

## 2017-02-28 DIAGNOSIS — R5381 Other malaise: Secondary | ICD-10-CM

## 2017-02-28 LAB — CULTURE, BLOOD (ROUTINE X 2)

## 2017-02-28 MED ORDER — ENSURE ENLIVE PO LIQD: 237.0000 mL | Freq: Three times a day (TID) | ORAL | 12 refills | Status: DC

## 2017-02-28 MED ORDER — POTASSIUM CHLORIDE ER 20 MEQ PO TBCR: 20.0000 meq | EXTENDED_RELEASE_TABLET | Freq: Every day | ORAL | 0 refills | Status: DC

## 2017-02-28 MED ORDER — ONDANSETRON 8 MG PO TBDP: 8.0000 mg | ORAL_TABLET | Freq: Three times a day (TID) | ORAL | 0 refills | Status: DC | PRN

## 2017-02-28 NOTE — Discharge Summary (Signed)
Physician Discharge Summary  Hector [name] XBM:[id number] DOB: 07-04-1970 DOA: 02/25/2017  PCP: Ulyess Blossom, MD  Admit date: 02/25/2017 Discharge date: 02/28/2017  Time spent: 35 minutes  Recommendations for Outpatient Follow-up:  1. Please repeat BMET and Mg level to follow electrolytes and renal function  2. Reassess volume status and BP; re-initiate diuretics and b-blockers if needed   Discharge Diagnoses:  Active Problems:   SIRS (systemic inflammatory response syndrome) (HCC)   FTT (failure to thrive) in adult   Hypokalemia   Hypomagnesemia   Physical deconditioning traumatic brain injury Seizure disorder   Discharge Condition: stable and improved. Discharge home with home health services.  Diet recommendation: low sodium diet   Filed Weights   02/25/17 1351 02/25/17 1939  Weight: 51.7 kg (114 lb) 47.9 kg (105 lb 9.6 oz)    History of present illness:  History of traumatic brain injury,seizure,expressive aphasia is brought to the ED due to generalized weakness, decreased appetite dehydration reported vomiting at home and  Diarrhea. Admitted due to failure to thrive and to further evaluate possibility of underlying systemic infection.  Hospital Course:  1-nausea, vomiting and diarrhea -most likely viral gastroenteritis; symptoms essentially resolved with supportive care -no growth on cultures -asymptomatic now and hemodynamically stable. -diet advance and tolerated; even still have some poor appetite -advise to maintain good hydration -will continue PRN antiemetics -no fever and normal WBC's -CT abd and pelvis non impressive for acute abnormalities.  2-dehydration -resolved with IVF"s -diet advance and tolerated -nutritional service recommended ensure TID.  3-QTC prolongation  -stable and without significant prolongation on telemetry after electrolytes repleted  4-hx of traumatic brain injury and seizure -no seizure appreciated -will continue  home antiepileptic regimen   5-severe malnutrition  -advance diet and continue feeding supplements -Appreciate assistance and recommendation from nutritional service  6-hx of cirrhosis  -no ascites seen -BP still soft -will continue holding b-blocker and diuretics until follow up with PCP -advise to follow low sodium diet   7-acute renal failure -from dehydration; pre-renal azotemia -Cr back to normal after IVF's given  -Repeat basic metabolic panel at follow up to assess renal function trend.  8-SIRS -resolved with supportive care and fluid resuscitation. -no antibiotics needed   9-1 out of 4 blood culture positive -Staph coagulase-negative -Most likely a contaminant -Continue monitoring off antibiotics  Procedures:  See below for x-ray reports   Consultations:  None   Discharge Exam: Vitals:   02/27/17 2205 02/28/17 0608  BP: (!) 90/51 98/63  Pulse: 61 60  Resp: 16 16  Temp: (!) 97.5 F (36.4 C) 98.4 F (36.9 C)  SpO2: 100% 99%     General: No fever, no chest pain, no shortness of breath.  Patient reports no abdominal pain.  Patient denies nausea and vomiting. No further loose stools.  Cardiovascular: S1 and S2, no rubs, no gallops, no murmur.   Respiratory: Good air movement, no wheezing, no crackles, normal respiratory effort.    Abdomen: Soft, no distention, no tenderness, positive bowel sounds.    Musculoskeletal: No edema, no cyanosis, no clubbing.     Discharge Instructions   Discharge Instructions    Diet - low sodium heart healthy   Complete by:  As directed    Discharge instructions   Complete by:  As directed    Take medications as prescribed Please arrange follow up with PCP in 10 days Maintain adequate hydration and increase oral intake. Stop spironolactone, metoprolol and lasix until follow up with PCP.  Current Discharge Medication List    START taking these medications   Details  feeding supplement, ENSURE ENLIVE,  (ENSURE ENLIVE) LIQD Take 237 mLs 3 (three) times daily between meals by mouth. Qty: 237 mL, Refills: 12    ondansetron (ZOFRAN ODT) 8 MG disintegrating tablet Take 1 tablet (8 mg total) every 8 (eight) hours as needed by mouth for nausea or vomiting. Qty: 20 tablet, Refills: 0    potassium chloride 20 MEQ TBCR Take 20 mEq daily by mouth. Qty: 30 tablet, Refills: 0      CONTINUE these medications which have NOT CHANGED   Details  amantadine (SYMMETREL) 100 MG capsule TAKE ONE CAPSULE BY MOUTH TWICE A DAY    bromocriptine (PARLODEL) 2.5 MG tablet Take 5 mg by mouth 2 (two) times daily.    clonazePAM (KLONOPIN) 1 MG tablet Take 1 mg by mouth at bedtime. Refills: 1    escitalopram (LEXAPRO) 10 MG tablet TAKE 1 TABLET BY MOUTH EVERY DAY    lacosamide (VIMPAT) 50 MG TABS tablet Take 1 tablet (50 mg total) by mouth 2 (two) times daily. Qty: 60 tablet, Refills: 5    LINZESS 290 MCG CAPS capsule Take 290 mcg by mouth daily. Refills: 1    oxcarbazepine (TRILEPTAL) 600 MG tablet TAKE 1 TABLET BY MOUTH TWICE A DAY Qty: 60 tablet, Refills: 5    pantoprazole (PROTONIX) 40 MG tablet Take 40 mg by mouth daily.     polyethylene glycol (MIRALAX / GLYCOLAX) packet Take 17 g by mouth daily as needed for moderate constipation.    QUEtiapine (SEROQUEL) 100 MG tablet Take 100-200 mg by mouth 3 (three) times daily. Take 100mg  by mouth at 10am, 100mg  by mouth at 1400, and 200mg  by mouth at bedtime    topiramate (TOPAMAX) 100 MG tablet TAKE 1 TABLET BY MOUTH TWICE A DAY Qty: 60 tablet, Refills: 5      STOP taking these medications     docusate sodium (COLACE) 100 MG capsule      furosemide (LASIX) 40 MG tablet      metoprolol tartrate (LOPRESSOR) 25 MG tablet      ondansetron (ZOFRAN) 4 MG tablet      spironolactone (ALDACTONE) 50 MG tablet      ondansetron (ZOFRAN) 4 MG tablet        Allergies  Allergen Reactions  . Ativan [Lorazepam] Other (See Comments)    Pt has paradoxical  effect with sedating medications.   . Lactulose Hives   Follow-up Information    [name], [name], MD. Schedule an appointment as soon as possible for a visit in 10 day(s).   Specialty:  Family Medicine Contact information: [address] [place] [id number] [phone]           The results of significant diagnostics from this hospitalization (including imaging, microbiology, ancillary and laboratory) are listed below for reference.    Significant Diagnostic Studies: Ct Head Wo Contrast  Result Date: 02/25/2017 CLINICAL DATA:  Initial evaluation for acute altered mental status. EXAM: CT HEAD WITHOUT CONTRAST TECHNIQUE: Contiguous axial images were obtained from the base of the skull through the vertex without intravenous contrast. COMPARISON:  Prior MRI from 02/13/2015. FINDINGS: Brain: Chronic encephalomalacia within the left frontotemporal region again seen, stable from previous. Associated ex vacuo dilatation of the left lateral ventricle, also unchanged. No acute intracranial hemorrhage. No evidence for acute large vessel territory infarct. No mass lesion or midline shift. No mass effect. No appreciable extra-axial fluid collection. Vascular:  No worrisome hyperdense vessel. Skull: Scalp soft tissues demonstrate no acute abnormality. Postoperative changes from prior left frontal craniotomy noted. No acute calvarial fracture. Sinuses/Orbits: Globes and orbital soft tissues within normal limits. Visualized paranasal sinuses and mastoid air cells are clear. Other: None. IMPRESSION: 1. No acute intracranial process. 2. Chronic left frontotemporal encephalomalacia with associated ex vacuo dilatation of the left lateral ventricle, stable. 3. Sequelae of prior left frontal craniotomy, unchanged. Electronically Signed   By: Rise Mu M.D.   On: 02/25/2017 15:14   Dg Chest Port 1 View  Result Date: 02/25/2017 CLINICAL DATA:  Low-grade fever.  Altered mental status. EXAM:  PORTABLE CHEST 1 VIEW COMPARISON:  Chest x-ray dated June 01, 2015. FINDINGS: The cardiomediastinal silhouette is normal in size. Normal pulmonary vascularity. Stable scarring of the peripheral left lower lung. No focal consolidation, pleural effusion, or pneumothorax. No acute osseous abnormality. IMPRESSION: No active disease. Electronically Signed   By: Obie Dredge M.D.   On: 02/25/2017 14:49   Dg Abd 2 Views  Result Date: 02/25/2017 CLINICAL DATA:  N/V/D today; smoker EXAM: ABDOMEN - 2 VIEW COMPARISON:  12/30/2016 FINDINGS: Normal bowel gas pattern.  No free air. Mild increased stool in the rectosigmoid. Generalized increased bowel gas partly obscures the soft tissues. Allowing for this, no evidence of renal or ureteral stones. No soft tissue abnormality. Skeletal structures are unremarkable. IMPRESSION: 1. No acute findings.  No evidence of bowel obstruction or free air. Electronically Signed   By: Amie Portland M.D.   On: 02/25/2017 16:26    Microbiology: Recent Results (from the past 240 hour(s))  Blood Culture (routine x 2)     Status: None (Preliminary result)   Collection Time: 02/25/17  1:58 PM  Result Value Ref Range Status   Specimen Description BLOOD RIGHT ANTECUBITAL  Final   Special Requests   Final    BOTTLES DRAWN AEROBIC AND ANAEROBIC Blood Culture results may not be optimal due to an excessive volume of blood received in culture bottles   Culture   Final    NO GROWTH 3 DAYS Performed at [facility], [address] [place], [place] [address]    Report Status PENDING  Incomplete  Blood Culture (routine x 2)     Status: Abnormal   Collection Time: 02/25/17  2:15 PM  Result Value Ref Range Status   Specimen Description BLOOD RIGHT HAND  Final   Special Requests   Final    BOTTLES DRAWN AEROBIC ONLY Blood Culture results may not be optimal due to an inadequate volume of blood received in culture bottles   Culture  Setup Time   Final    GRAM POSITIVE COCCI IN  CLUSTERS AEROBIC BOTTLE ONLY CRITICAL RESULT CALLED TO, READ BACK BY AND VERIFIED WITH: M BELL,PHARMD AT 1133 02/26/17 BY L BENFIELD    Culture (A)  Final    STAPHYLOCOCCUS SPECIES (COAGULASE NEGATIVE) THE SIGNIFICANCE OF ISOLATING THIS ORGANISM FROM A SINGLE SET OF BLOOD CULTURES WHEN MULTIPLE SETS ARE DRAWN IS UNCERTAIN. PLEASE NOTIFY THE MICROBIOLOGY DEPARTMENT WITHIN ONE WEEK IF SPECIATION AND SENSITIVITIES ARE REQUIRED. Performed at [facility], [address], [place], [place] [address]    Report Status 02/28/2017 FINAL  Final  Blood Culture ID Panel (Reflexed)     Status: Abnormal   Collection Time: 02/25/17  2:15 PM  Result Value Ref Range Status   Enterococcus species NOT DETECTED NOT DETECTED Final   Listeria monocytogenes NOT DETECTED NOT DETECTED Final  Staphylococcus species DETECTED (A) NOT DETECTED Final    Comment: Methicillin (oxacillin) susceptible coagulase negative staphylococcus. Possible blood culture contaminant (unless isolated from more than one blood culture draw or clinical case suggests pathogenicity). No antibiotic treatment is indicated for blood  culture contaminants. CRITICAL RESULT CALLED TO, READ BACK BY AND VERIFIED WITH: M BELL,PHARMD AT 1133 02/26/17 BY L BENFIELD    Staphylococcus aureus NOT DETECTED NOT DETECTED Final   Methicillin resistance NOT DETECTED NOT DETECTED Final   Streptococcus species NOT DETECTED NOT DETECTED Final   Streptococcus agalactiae NOT DETECTED NOT DETECTED Final   Streptococcus pneumoniae NOT DETECTED NOT DETECTED Final   Streptococcus pyogenes NOT DETECTED NOT DETECTED Final   Acinetobacter baumannii NOT DETECTED NOT DETECTED Final   Enterobacteriaceae species NOT DETECTED NOT DETECTED Final   Enterobacter cloacae complex NOT DETECTED NOT DETECTED Final   Escherichia coli NOT DETECTED NOT DETECTED Final   Klebsiella oxytoca NOT DETECTED NOT DETECTED Final   Klebsiella pneumoniae NOT DETECTED NOT DETECTED Final    Proteus species NOT DETECTED NOT DETECTED Final   Serratia marcescens NOT DETECTED NOT DETECTED Final   Haemophilus influenzae NOT DETECTED NOT DETECTED Final   Neisseria meningitidis NOT DETECTED NOT DETECTED Final   Pseudomonas aeruginosa NOT DETECTED NOT DETECTED Final   Candida albicans NOT DETECTED NOT DETECTED Final   Candida glabrata NOT DETECTED NOT DETECTED Final   Candida krusei NOT DETECTED NOT DETECTED Final   Candida parapsilosis NOT DETECTED NOT DETECTED Final   Candida tropicalis NOT DETECTED NOT DETECTED Final    Comment: Performed at [facility], [address] [place], [place] [address]     Labs: Basic Metabolic Panel: Recent Labs  Lab 02/25/17 1225 02/25/17 1415 02/26/17 0543 02/27/17 0524  NA 140  --  141 146*  K 2.3*  --  2.7* 4.0  CL 101  --  110 117*  CO2 27  --  26 23  GLUCOSE 153*  --  100* 79  BUN 20  --  16 10  CREATININE 2.13*  --  1.33* 1.18  CALCIUM 8.9  --  8.2* 8.7*  MG  --  1.5* 2.0 1.8  PHOS  --   --   --  2.3*   Liver Function Tests: Recent Labs  Lab 02/25/17 1225 02/26/17 0543  AST 18 14*  ALT 9* 8*  ALKPHOS 106 83  BILITOT 0.7 0.8  PROT 7.0 5.4*  ALBUMIN 4.0 2.9*   Recent Labs  Lab 02/25/17 1225  LIPASE 35   Recent Labs  Lab 02/25/17 1415  AMMONIA 22   CBC: Recent Labs  Lab 02/25/17 1225 02/26/17 0543  WBC 7.4 8.4  HGB 12.9* 10.4*  HCT 37.5* 30.9*  MCV 99.5 100.3*  PLT 113* 90*    Signed:  Vassie Loll MD.  [facility] 02/28/2017, 2:44 PM

## 2017-02-28 NOTE — Progress Notes (Signed)
Patient and family given discharge, follow up, and medication instructions, verbalized understanding, IVs x 2 and telemetry removed, personal belongings and prescription with patient, family to transport home

## 2017-02-28 NOTE — Progress Notes (Addendum)
Spoke with pt and father at bedside concerning HH needs. Pt and father agreed with HH/ [facility]. Referral given to [facility].

## 2017-03-02 LAB — CULTURE, BLOOD (ROUTINE X 2): CULTURE: NO GROWTH

## 2017-03-11 ENCOUNTER — Other Ambulatory Visit: Payer: Self-pay | Admitting: Neurology

## 2017-03-11 ENCOUNTER — Other Ambulatory Visit: Payer: Self-pay | Admitting: *Deleted

## 2017-03-11 MED ORDER — TOPIRAMATE 100 MG PO TABS: 100.0000 mg | ORAL_TABLET | Freq: Two times a day (BID) | ORAL | 5 refills | Status: DC

## 2017-03-11 NOTE — Telephone Encounter (Signed)
Pt's father said he needs a prescription for Topamax called in

## 2017-04-17 ENCOUNTER — Other Ambulatory Visit: Payer: Self-pay

## 2017-04-17 ENCOUNTER — Inpatient Hospital Stay (HOSPITAL_COMMUNITY)
Admission: EM | Admit: 2017-04-17 | Discharge: 2017-04-29 | DRG: 091 | Disposition: A | Discharge status: Home Health | Payer: Medicaid Other | Attending: Nephrology | Admitting: Nephrology

## 2017-04-17 ENCOUNTER — Encounter (HOSPITAL_COMMUNITY): Payer: Self-pay

## 2017-04-17 ENCOUNTER — Emergency Department (HOSPITAL_COMMUNITY): Payer: Medicaid Other

## 2017-04-17 DIAGNOSIS — Z681 Body mass index (BMI) 19 or less, adult: Secondary | ICD-10-CM

## 2017-04-17 DIAGNOSIS — E870 Hyperosmolality and hypernatremia: Secondary | ICD-10-CM | POA: Diagnosis present

## 2017-04-17 DIAGNOSIS — R569 Unspecified convulsions: Secondary | ICD-10-CM

## 2017-04-17 DIAGNOSIS — E876 Hypokalemia: Secondary | ICD-10-CM | POA: Diagnosis present

## 2017-04-17 DIAGNOSIS — G251 Drug-induced tremor: Principal | ICD-10-CM | POA: Diagnosis present

## 2017-04-17 DIAGNOSIS — G252 Other specified forms of tremor: Secondary | ICD-10-CM

## 2017-04-17 DIAGNOSIS — G40909 Epilepsy, unspecified, not intractable, without status epilepticus: Secondary | ICD-10-CM | POA: Diagnosis present

## 2017-04-17 DIAGNOSIS — J690 Pneumonitis due to inhalation of food and vomit: Secondary | ICD-10-CM | POA: Diagnosis not present

## 2017-04-17 DIAGNOSIS — F17210 Nicotine dependence, cigarettes, uncomplicated: Secondary | ICD-10-CM | POA: Diagnosis present

## 2017-04-17 DIAGNOSIS — R296 Repeated falls: Secondary | ICD-10-CM | POA: Diagnosis present

## 2017-04-17 DIAGNOSIS — R251 Tremor, unspecified: Secondary | ICD-10-CM

## 2017-04-17 DIAGNOSIS — R0902 Hypoxemia: Secondary | ICD-10-CM

## 2017-04-17 DIAGNOSIS — F419 Anxiety disorder, unspecified: Secondary | ICD-10-CM | POA: Diagnosis present

## 2017-04-17 DIAGNOSIS — T43595A Adverse effect of other antipsychotics and neuroleptics, initial encounter: Secondary | ICD-10-CM | POA: Diagnosis present

## 2017-04-17 DIAGNOSIS — R4701 Aphasia: Secondary | ICD-10-CM | POA: Diagnosis present

## 2017-04-17 DIAGNOSIS — F329 Major depressive disorder, single episode, unspecified: Secondary | ICD-10-CM | POA: Diagnosis present

## 2017-04-17 DIAGNOSIS — B349 Viral infection, unspecified: Secondary | ICD-10-CM | POA: Diagnosis present

## 2017-04-17 DIAGNOSIS — T43225A Adverse effect of selective serotonin reuptake inhibitors, initial encounter: Secondary | ICD-10-CM | POA: Diagnosis present

## 2017-04-17 DIAGNOSIS — R1312 Dysphagia, oropharyngeal phase: Secondary | ICD-10-CM | POA: Diagnosis present

## 2017-04-17 DIAGNOSIS — D696 Thrombocytopenia, unspecified: Secondary | ICD-10-CM | POA: Diagnosis present

## 2017-04-17 DIAGNOSIS — T428X5A Adverse effect of antiparkinsonism drugs and other central muscle-tone depressants, initial encounter: Secondary | ICD-10-CM | POA: Diagnosis present

## 2017-04-17 DIAGNOSIS — Z8249 Family history of ischemic heart disease and other diseases of the circulatory system: Secondary | ICD-10-CM

## 2017-04-17 DIAGNOSIS — K219 Gastro-esophageal reflux disease without esophagitis: Secondary | ICD-10-CM | POA: Diagnosis present

## 2017-04-17 DIAGNOSIS — R0600 Dyspnea, unspecified: Secondary | ICD-10-CM

## 2017-04-17 DIAGNOSIS — E440 Moderate protein-calorie malnutrition: Secondary | ICD-10-CM | POA: Diagnosis present

## 2017-04-17 DIAGNOSIS — Z86718 Personal history of other venous thrombosis and embolism: Secondary | ICD-10-CM

## 2017-04-17 DIAGNOSIS — R109 Unspecified abdominal pain: Secondary | ICD-10-CM

## 2017-04-17 DIAGNOSIS — D539 Nutritional anemia, unspecified: Secondary | ICD-10-CM | POA: Diagnosis present

## 2017-04-17 DIAGNOSIS — N183 Chronic kidney disease, stage 3 (moderate): Secondary | ICD-10-CM | POA: Diagnosis present

## 2017-04-17 DIAGNOSIS — Z87820 Personal history of traumatic brain injury: Secondary | ICD-10-CM

## 2017-04-17 DIAGNOSIS — N179 Acute kidney failure, unspecified: Secondary | ICD-10-CM | POA: Diagnosis present

## 2017-04-17 DIAGNOSIS — J9601 Acute respiratory failure with hypoxia: Secondary | ICD-10-CM | POA: Diagnosis not present

## 2017-04-17 DIAGNOSIS — J449 Chronic obstructive pulmonary disease, unspecified: Secondary | ICD-10-CM | POA: Diagnosis present

## 2017-04-17 DIAGNOSIS — K7030 Alcoholic cirrhosis of liver without ascites: Secondary | ICD-10-CM | POA: Diagnosis present

## 2017-04-17 LAB — COMPREHENSIVE METABOLIC PANEL
ALT: 20 U/L (ref 17–63)
AST: 31 U/L (ref 15–41)
Albumin: 3.9 g/dL (ref 3.5–5.0)
Alkaline Phosphatase: 101 U/L (ref 38–126)
Anion gap: 7 (ref 5–15)
BUN: 29 mg/dL — ABNORMAL HIGH (ref 6–20)
CHLORIDE: 115 mmol/L — AB (ref 101–111)
CO2: 22 mmol/L (ref 22–32)
CREATININE: 1.54 mg/dL — AB (ref 0.61–1.24)
Calcium: 9.1 mg/dL (ref 8.9–10.3)
GFR calc non Af Amer: 52 mL/min — ABNORMAL LOW (ref >60)
GFR, EST AFRICAN AMERICAN: 60 mL/min — AB (ref >60)
Glucose, Bld: 104 mg/dL — ABNORMAL HIGH (ref 65–99)
POTASSIUM: 4.1 mmol/L (ref 3.5–5.1)
Sodium: 144 mmol/L (ref 135–145)
TOTAL PROTEIN: 7 g/dL (ref 6.5–8.1)
Total Bilirubin: 0.6 mg/dL (ref 0.3–1.2)

## 2017-04-17 LAB — MAGNESIUM: MAGNESIUM: 2.2 mg/dL (ref 1.7–2.4)

## 2017-04-17 LAB — LIPASE, BLOOD: LIPASE: 29 U/L (ref 11–51)

## 2017-04-17 LAB — I-STAT CG4 LACTIC ACID, ED: LACTIC ACID, VENOUS: 2.6 mmol/L — AB (ref 0.5–1.9)

## 2017-04-17 LAB — CBG MONITORING, ED: GLUCOSE-CAPILLARY: 104 mg/dL — AB (ref 65–99)

## 2017-04-17 MED ORDER — SODIUM CHLORIDE 0.9 % IV BOLUS (SEPSIS)
1000.0000 mL | Freq: Once | INTRAVENOUS | Status: AC
  Administered 2017-04-17: 1000 mL via INTRAVENOUS

## 2017-04-17 NOTE — ED Notes (Addendum)
Pt has a hx of TBI, pt has tremors present, sometimes more violent then others. He is alert during these instances of more violent tremors.He repeats words in a stutter like fashion. Still able to communicate very basic things. He stated he is not in any pain and the tremors started after he was taken off of his lexapro. He is able to follow commands. He is alert to himself, location and situation.

## 2017-04-17 NOTE — ED Notes (Signed)
Theone StanleyLester Kainz dad [phone]

## 2017-04-17 NOTE — ED Provider Notes (Signed)
[facility]-EMERGENCY DEPT Provider Note   CSN: [id number] Arrival date & time: 04/17/17  2045     History   Chief Complaint Chief Complaint  Patient presents with  . Seizures    HPI Hector [name] is a 46 y.o. male.  The history is provided by a parent and medical records. The history is limited by the condition of the patient. No language interpreter was used.  Neurologic Problem  This is a recurrent problem. The current episode started yesterday. The problem occurs hourly. The problem has not changed since onset.Pertinent negatives include no chest pain, no abdominal pain, no headaches and no shortness of breath. Nothing aggravates the symptoms. Nothing relieves the symptoms. He has tried nothing for the symptoms.   LVL 5 Caveat for TBI and expressive aphasia  Past Medical History:  Diagnosis Date  . Cirrhosis (HCC)   . Depression   . DVT (deep venous thrombosis) (HCC)    UE, LE DVT during hospitalization  . GERD (gastroesophageal reflux disease)   . H/O ETOH abuse   . H/O renal calculi   . Seizure (HCC)   . TBI (traumatic brain injury) (HCC)    07/2013, fall from standing post seizure    Patient Active Problem List   Diagnosis Date Noted  . Hypokalemia   . Hypomagnesemia   . Physical deconditioning   . FTT (failure to thrive) in adult 02/25/2017  . Oropharyngeal dysphagia 01/01/2017  . Orthostatic hypotension 01/01/2017  . Failure to thrive in adult 12/31/2016  . Vomiting   . Seizure (HCC) 06/01/2015  . Alcoholic cirrhosis of liver without ascites (HCC)   . Dehydration   . Encephalopathy acute 02/13/2015  . Altered mental status   . Ataxia 02/12/2015  . Localization-related symptomatic epilepsy and epileptic syndromes with complex partial seizures, not intractable, without status epilepticus (HCC) 08/16/2014  . Tobacco abuse 08/16/2014  . Acute kidney injury superimposed on CKD (HCC) 06/25/2014  . Dysphagia 06/25/2014  .  Centrilobular emphysema (HCC) 01/27/2014  . Tracheostomy in place [facility]) 01/27/2014  . Encounter for family conference with patient present 01/23/2014  . Dvt femoral (deep venous thrombosis) (HCC) 01/05/2014  . Protein-calorie malnutrition, severe (HCC) 01/05/2014  . Alcohol abuse 01/05/2014  . Acute respiratory failure with hypoxia (HCC) 12/20/2013  . Delirium due to general medical condition 12/13/2013  . HCAP (healthcare-associated pneumonia) 12/13/2013  . SIRS (systemic inflammatory response syndrome) (HCC) 12/13/2013  . TBI (traumatic brain injury) (HCC) 12/12/2013    Past Surgical History:  Procedure Laterality Date  . BRAIN SURGERY    . GASTROSTOMY TUBE PLACEMENT    . REMOVAL OF GASTROSTOMY TUBE    . TRACHEOSTOMY     feinstein       Home Medications    Prior to Admission medications   Medication Sig Start Date End Date Taking? Authorizing Provider  amantadine (SYMMETREL) 100 MG capsule TAKE ONE CAPSULE BY MOUTH TWICE A DAY 01/06/17   [provider]  bromocriptine (PARLODEL) 2.5 MG tablet Take 5 mg by mouth 2 (two) times daily.    [provider]  clonazePAM (KLONOPIN) 1 MG tablet Take 1 mg by mouth at bedtime. 12/24/16   [provider]  escitalopram (LEXAPRO) 10 MG tablet TAKE 1 TABLET BY MOUTH EVERY DAY 01/03/17   [provider]  feeding supplement, ENSURE ENLIVE, (ENSURE ENLIVE) LIQD Take 237 mLs 3 (three) times daily between meals by mouth. 02/28/17   Vassie Loll, MD  lacosamide (VIMPAT) 50 MG TABS tablet  Take 1 tablet (50 mg total) by mouth 2 (two) times daily. 10/18/16   Drema DallasJaffe, Adam R, DO  LINZESS 290 MCG CAPS capsule Take 290 mcg by mouth daily. 11/20/16   [provider]  ondansetron (ZOFRAN ODT) 8 MG disintegrating tablet Take 1 tablet (8 mg total) every 8 (eight) hours as needed by mouth for nausea or vomiting. 02/28/17   Vassie LollMadera, Carlos, MD  oxcarbazepine (TRILEPTAL) 600 MG tablet TAKE 1 TABLET BY MOUTH TWICE A DAY  10/14/16   Everlena CooperJaffe, Adam R, DO  pantoprazole (PROTONIX) 40 MG tablet Take 40 mg by mouth daily.     [provider]  polyethylene glycol (MIRALAX / GLYCOLAX) packet Take 17 g by mouth daily as needed for moderate constipation.    [provider]  potassium chloride 20 MEQ TBCR Take 20 mEq daily by mouth. 02/28/17   Vassie LollMadera, Carlos, MD  QUEtiapine (SEROQUEL) 100 MG tablet Take 100-200 mg by mouth 3 (three) times daily. Take 100mg  by mouth at 10am, 100mg  by mouth at 1400, and 200mg  by mouth at bedtime    [provider]  topiramate (TOPAMAX) 100 MG tablet Take 1 tablet (100 mg total) 2 (two) times daily by mouth. 03/11/17   Drema DallasJaffe, Adam R, DO    Family History Family History  Problem Relation Age of Onset  . Hypercholesterolemia Mother   . Hypertension Mother   . Congestive Heart Failure Brother        Deceased age 48    Social History Social History   Tobacco Use  . Smoking status: Current Some Day Smoker    Packs/day: 0.25    Years: 20.00    Pack years: 5.00    Types: Cigarettes  . Smokeless tobacco: Never Used  Substance Use Topics  . Alcohol use: Yes    Alcohol/week: 0.0 oz  . Drug use: No     Allergies   Ativan [lorazepam] and Lactulose   Review of Systems Review of Systems  Unable to perform ROS: Patient nonverbal  Respiratory: Negative for shortness of breath.   Cardiovascular: Negative for chest pain.  Gastrointestinal: Negative for abdominal pain.  Neurological: Positive for tremors. Negative for headaches.     Physical Exam Updated Vital Signs BP 108/62 (BP Location: Left Arm)   Pulse 90   Temp 97.6 F (36.4 C) (Oral)   Resp 20   Ht 5\' 10"  (1.778 m)   Wt 47.6 kg (105 lb)   SpO2 97%   BMI 15.07 kg/m   Physical Exam  Constitutional: He appears well-developed and well-nourished. No distress.  HENT:  Head: Normocephalic and atraumatic.  Nose: Nose normal.  Mouth/Throat: Oropharynx is clear and moist. No oropharyngeal exudate.    Eyes: Conjunctivae and EOM are normal. Pupils are equal, round, and reactive to light.  Cardiovascular: Normal rate and intact distal pulses.  No murmur heard. Pulmonary/Chest: Effort normal. No stridor. No respiratory distress. He has no wheezes. He has no rales. He exhibits no tenderness.  Abdominal: Soft. Bowel sounds are normal. He exhibits no distension. There is no tenderness.  Musculoskeletal: He exhibits no tenderness.  Neurological: He is alert. No sensory deficit. He exhibits abnormal muscle tone (shaking in all extremitieis).  Skin: Skin is warm. Capillary refill takes less than 2 seconds. He is not diaphoretic. No erythema. No pallor.  Psychiatric: He has a normal mood and affect.  Nursing note and vitals reviewed.    ED Treatments / Results  Labs (all labs ordered are listed, but only  abnormal results are displayed) Labs Reviewed  CBC WITH DIFFERENTIAL/PLATELET - Abnormal; Notable for the following components:      Result Value   RBC 3.33 (*)    Hemoglobin 11.0 (*)    HCT 33.8 (*)    MCV 101.5 (*)    Platelets 113 (*)    All other components within normal limits  COMPREHENSIVE METABOLIC PANEL - Abnormal; Notable for the following components:   Chloride 115 (*)    Glucose, Bld 104 (*)    BUN 29 (*)    Creatinine, Ser 1.54 (*)    GFR calc non Af Amer 52 (*)    GFR calc Af Amer 60 (*)    All other components within normal limits  CBG MONITORING, ED - Abnormal; Notable for the following components:   Glucose-Capillary 104 (*)    All other components within normal limits  I-STAT CG4 LACTIC ACID, ED - Abnormal; Notable for the following components:   Lactic Acid, Venous 2.60 (*)    All other components within normal limits  URINE CULTURE  LIPASE, BLOOD  URINALYSIS, ROUTINE W REFLEX MICROSCOPIC  MAGNESIUM  CK  I-STAT CG4 LACTIC ACID, ED    EKG  EKG Interpretation None       Radiology Ct Head Wo Contrast  Result Date: 04/17/2017 CLINICAL DATA:  Acute  onset of tremors and stuttering. EXAM: CT HEAD WITHOUT CONTRAST TECHNIQUE: Contiguous axial images were obtained from the base of the skull through the vertex without intravenous contrast. COMPARISON:  CT of the head performed 02/25/2017 FINDINGS: Brain: No evidence of acute infarction, hemorrhage, hydrocephalus, extra-axial collection or mass lesion/mass effect. There is chronic ex vacuo dilatation of the left lateral ventricle, reflecting remote infarct. Underlying encephalomalacia is noted at the left parietal and temporal lobes. Small chronic infarcts are noted at the inferior frontal lobes bilaterally. The brainstem and fourth ventricle are within normal limits. No mass effect or midline shift is seen. Vascular: No hyperdense vessel or unexpected calcification. Skull: There is no evidence of fracture; a left parietal craniotomy flap is noted. Sinuses/Orbits: The orbits are within normal limits. Inspissated mucus is noted at the right maxillary sinus. The remaining paranasal sinuses and mastoid air cells are well-aerated. Other: No significant soft tissue abnormalities are seen. IMPRESSION: 1. No acute intracranial pathology seen on CT. 2. 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 Underlying encephalomalacia at the left parietal and temporal lobes. Overlying postoperative change noted. 3. 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 Electronically Signed   By: Roanna RaiderJeffery  Chang M.D.   On: 04/17/2017 23:25   Dg Chest Portable 1 View  Result Date: 04/17/2017 CLINICAL DATA:  Tremors and possible infection EXAM: PORTABLE CHEST 1 VIEW COMPARISON:  Chest radiograph 02/25/2017 FINDINGS: The heart size and mediastinal contours are within normal limits. Both lungs are clear. The visualized skeletal structures are unremarkable. IMPRESSION: No active disease. Electronically Signed   By: Deatra RobinsonKevin  Herman M.D.   On: 04/17/2017 22:26    Procedures Procedures  (including critical care time)  Medications Ordered in ED Medications  sodium chloride 0.9 % bolus 1,000 mL (0 mLs Intravenous Stopped 04/17/17 2300)     Initial Impression / Assessment and Plan / ED Course  I have reviewed the triage vital signs and the nursing notes.  Pertinent labs & imaging results that were available during my care of the patient were reviewed by me and considered in my medical decision making (see chart for details).  SIMS LADAY is a 46 y.o. male with a past medical history significant for seizures, traumatic brain injury, recent admission for acute kidney injury/electrolyte/abnormalies/SIRS, emphysema, cirrhosis, and prior DVT who presents for shaking episodes.  According to the patient's father who was called on the phone, patient has been having a several shaking episodes over the last 2 days.  He reports that patient has been agitated and having a full body shaking spells that do not seem similar to prior seizures.  The patient is alert and oriented during the shaking spells.  The father reports that these are similar to when the patient had kidney problems and electrolyte abnormalities.  The father reports that the patient has not had significant cough, congestion, vomiting, urinary symptoms, or GI symptoms.  The father reports that the patient has fallen several times in the last 10 days but is unsure about head injury.  Patient is unable to answer any questions given his expressive aphasia.   On exam, patient is shaking all over including his legs.  Patient is following commands with all extremities.  Patient's lungs are clear and abdomen is nontender.  Chest is nontender.  No evidence of trauma on initial exam.  No back tenderness.  Based on patient's symptoms, I am concerned that his shaking may be similar to prior when it was secondary to dehydration, kidney function worsening, and electrolyte problems.  Patient will have workup for occult infection as  well as new injury to his head given the recent falls.  Patient shaking does not appear seizure-like but is rhythmic.  Partial focal seizures are considered.  Patient will be given fluids given his recent acute kidney injury.  Initial laboratory testing was overall reassuring aside from evidence of recurrent acute kidney injury.  CBC showed no leukocytosis with mild anemia.  Lactic acid was initially elevated but after fluids improved.  Urinalysis showed no infection.  Lipase and magnesium were unremarkable.  Metabolic panel showed his potassium and sodium were within normal limits.  Next  CT head showed no new intracranial abnormality.  Chest x-ray shows no pneumonia.  Next  Due to the intense shaking, CK was ordered to look for rhabdo.  Patient is awaiting reassessment after fluids and the CK testing.  Care transferred to Dr. Rhunette Croft while awaiting reassessment.  Patient is found to have elevated CK, or if patient continues to have shaking spells with AK I and dehydration, patient may need admission for continued rehydration.  If symptoms have improved and patient appears well, would consider discharge home.  Care transferred in stable condition.   Final Clinical Impressions(s) / ED Diagnoses   Final diagnoses:  Shaking     Clinical Impression: 1. Shaking     Disposition: Awaiting reassessment, care transferred to Dr. Ernestene Mention, Canary Brim, MD 04/18/17 [phone]

## 2017-04-17 NOTE — ED Notes (Signed)
Bed: WA16 Expected date:  Expected time:  Means of arrival:  Comments: seizure 

## 2017-04-17 NOTE — ED Notes (Signed)
Father=Lester Pain [phone]

## 2017-04-17 NOTE — ED Triage Notes (Signed)
For 2 days increasing seizure activity per family last about 2-5 no LOC just shakes more alert and at norm per family.

## 2017-04-17 NOTE — ED Notes (Signed)
I gave critical I Stat CG4 result to MD Tegeler 

## 2017-04-18 ENCOUNTER — Encounter (HOSPITAL_COMMUNITY): Payer: Self-pay | Admitting: Internal Medicine

## 2017-04-18 ENCOUNTER — Other Ambulatory Visit: Payer: Self-pay

## 2017-04-18 ENCOUNTER — Inpatient Hospital Stay (HOSPITAL_COMMUNITY)
Admit: 2017-04-18 | Discharge: 2017-04-18 | Disposition: A | Discharge status: Home / Self Care | Payer: Medicaid Other | Attending: Internal Medicine | Admitting: Internal Medicine

## 2017-04-18 DIAGNOSIS — G252 Other specified forms of tremor: Secondary | ICD-10-CM | POA: Diagnosis present

## 2017-04-18 DIAGNOSIS — N179 Acute kidney failure, unspecified: Secondary | ICD-10-CM

## 2017-04-18 DIAGNOSIS — D539 Nutritional anemia, unspecified: Secondary | ICD-10-CM | POA: Diagnosis present

## 2017-04-18 DIAGNOSIS — R413 Other amnesia: Secondary | ICD-10-CM | POA: Diagnosis not present

## 2017-04-18 DIAGNOSIS — R0902 Hypoxemia: Secondary | ICD-10-CM | POA: Diagnosis not present

## 2017-04-18 DIAGNOSIS — J9601 Acute respiratory failure with hypoxia: Secondary | ICD-10-CM | POA: Diagnosis not present

## 2017-04-18 DIAGNOSIS — E870 Hyperosmolality and hypernatremia: Secondary | ICD-10-CM | POA: Diagnosis present

## 2017-04-18 DIAGNOSIS — K219 Gastro-esophageal reflux disease without esophagitis: Secondary | ICD-10-CM | POA: Diagnosis present

## 2017-04-18 DIAGNOSIS — R251 Tremor, unspecified: Secondary | ICD-10-CM | POA: Diagnosis present

## 2017-04-18 DIAGNOSIS — Z681 Body mass index (BMI) 19 or less, adult: Secondary | ICD-10-CM | POA: Diagnosis not present

## 2017-04-18 DIAGNOSIS — Z87820 Personal history of traumatic brain injury: Secondary | ICD-10-CM | POA: Diagnosis not present

## 2017-04-18 DIAGNOSIS — D696 Thrombocytopenia, unspecified: Secondary | ICD-10-CM | POA: Diagnosis present

## 2017-04-18 DIAGNOSIS — R296 Repeated falls: Secondary | ICD-10-CM | POA: Diagnosis present

## 2017-04-18 DIAGNOSIS — Z8249 Family history of ischemic heart disease and other diseases of the circulatory system: Secondary | ICD-10-CM | POA: Diagnosis not present

## 2017-04-18 DIAGNOSIS — E440 Moderate protein-calorie malnutrition: Secondary | ICD-10-CM | POA: Diagnosis present

## 2017-04-18 DIAGNOSIS — R569 Unspecified convulsions: Secondary | ICD-10-CM | POA: Diagnosis not present

## 2017-04-18 DIAGNOSIS — K7030 Alcoholic cirrhosis of liver without ascites: Secondary | ICD-10-CM | POA: Diagnosis present

## 2017-04-18 DIAGNOSIS — K7200 Acute and subacute hepatic failure without coma: Secondary | ICD-10-CM | POA: Diagnosis not present

## 2017-04-18 DIAGNOSIS — Z86718 Personal history of other venous thrombosis and embolism: Secondary | ICD-10-CM | POA: Diagnosis not present

## 2017-04-18 DIAGNOSIS — G40909 Epilepsy, unspecified, not intractable, without status epilepticus: Secondary | ICD-10-CM | POA: Diagnosis present

## 2017-04-18 DIAGNOSIS — F329 Major depressive disorder, single episode, unspecified: Secondary | ICD-10-CM | POA: Diagnosis present

## 2017-04-18 DIAGNOSIS — N183 Chronic kidney disease, stage 3 (moderate): Secondary | ICD-10-CM | POA: Diagnosis not present

## 2017-04-18 DIAGNOSIS — R4701 Aphasia: Secondary | ICD-10-CM | POA: Diagnosis present

## 2017-04-18 DIAGNOSIS — F419 Anxiety disorder, unspecified: Secondary | ICD-10-CM | POA: Diagnosis present

## 2017-04-18 DIAGNOSIS — J9621 Acute and chronic respiratory failure with hypoxia: Secondary | ICD-10-CM | POA: Diagnosis not present

## 2017-04-18 DIAGNOSIS — J449 Chronic obstructive pulmonary disease, unspecified: Secondary | ICD-10-CM | POA: Diagnosis present

## 2017-04-18 DIAGNOSIS — B349 Viral infection, unspecified: Secondary | ICD-10-CM | POA: Diagnosis present

## 2017-04-18 DIAGNOSIS — G251 Drug-induced tremor: Secondary | ICD-10-CM | POA: Diagnosis present

## 2017-04-18 DIAGNOSIS — J690 Pneumonitis due to inhalation of food and vomit: Secondary | ICD-10-CM | POA: Diagnosis not present

## 2017-04-18 DIAGNOSIS — E876 Hypokalemia: Secondary | ICD-10-CM | POA: Diagnosis present

## 2017-04-18 DIAGNOSIS — F17210 Nicotine dependence, cigarettes, uncomplicated: Secondary | ICD-10-CM | POA: Diagnosis present

## 2017-04-18 LAB — BASIC METABOLIC PANEL
Anion gap: 10 (ref 5–15)
BUN: 26 mg/dL — AB (ref 6–20)
CALCIUM: 9.2 mg/dL (ref 8.9–10.3)
CO2: 18 mmol/L — AB (ref 22–32)
Chloride: 115 mmol/L — ABNORMAL HIGH (ref 101–111)
Creatinine, Ser: 1.48 mg/dL — ABNORMAL HIGH (ref 0.61–1.24)
GFR calc Af Amer: >60 mL/min (ref >60)
GFR, EST NON AFRICAN AMERICAN: 54 mL/min — AB (ref >60)
GLUCOSE: 83 mg/dL (ref 65–99)
POTASSIUM: 3.8 mmol/L (ref 3.5–5.1)
Sodium: 143 mmol/L (ref 135–145)

## 2017-04-18 LAB — CBC WITH DIFFERENTIAL/PLATELET
BASOS ABS: 0 10*3/uL (ref 0.0–0.1)
BASOS PCT: 0 %
Band Neutrophils: 4 %
Basophils Absolute: 0 10*3/uL (ref 0.0–0.1)
Basophils Relative: 0 %
Blasts: 0 %
EOS PCT: 0 %
EOS PCT: 1 %
Eosinophils Absolute: 0 10*3/uL (ref 0.0–0.7)
Eosinophils Absolute: 0.1 10*3/uL (ref 0.0–0.7)
HCT: 33.8 % — ABNORMAL LOW (ref 39.0–52.0)
HEMATOCRIT: 33.4 % — AB (ref 39.0–52.0)
Hemoglobin: 11 g/dL — ABNORMAL LOW (ref 13.0–17.0)
Hemoglobin: 11.1 g/dL — ABNORMAL LOW (ref 13.0–17.0)
LYMPHS ABS: 1 10*3/uL (ref 0.7–4.0)
LYMPHS PCT: 11 %
LYMPHS PCT: 19 %
Lymphs Abs: 1.5 10*3/uL (ref 0.7–4.0)
MCH: 33 pg (ref 26.0–34.0)
MCH: 33.9 pg (ref 26.0–34.0)
MCHC: 32.5 g/dL (ref 30.0–36.0)
MCHC: 33.2 g/dL (ref 30.0–36.0)
MCV: 101.5 fL — AB (ref 78.0–100.0)
MCV: 102.1 fL — AB (ref 78.0–100.0)
MONO ABS: 1 10*3/uL (ref 0.1–1.0)
MONO ABS: 1 10*3/uL (ref 0.1–1.0)
MONOS PCT: 12 %
Metamyelocytes Relative: 0 %
Monocytes Relative: 11 %
Myelocytes: 0 %
NEUTROS ABS: 7.3 10*3/uL (ref 1.7–7.7)
NEUTROS ABS: 5.5 10*3/uL (ref 1.7–7.7)
NEUTROS PCT: 74 %
NRBC: 0 /100{WBCs}
Neutrophils Relative %: 68 %
OTHER: 0 %
PLATELETS: 113 10*3/uL — AB (ref 150–400)
PLATELETS: 109 10*3/uL — AB (ref 150–400)
Promyelocytes Absolute: 0 %
RBC: 3.33 MIL/uL — ABNORMAL LOW (ref 4.22–5.81)
RBC: 3.27 MIL/uL — ABNORMAL LOW (ref 4.22–5.81)
RDW: 13.5 % (ref 11.5–15.5)
RDW: 13.5 % (ref 11.5–15.5)
WBC: 9.3 10*3/uL (ref 4.0–10.5)
WBC: 8.1 10*3/uL (ref 4.0–10.5)

## 2017-04-18 LAB — HEPATIC FUNCTION PANEL
ALK PHOS: 113 U/L (ref 38–126)
ALT: 21 U/L (ref 17–63)
AST: 39 U/L (ref 15–41)
Albumin: 4.2 g/dL (ref 3.5–5.0)
BILIRUBIN DIRECT: 0.1 mg/dL (ref 0.1–0.5)
BILIRUBIN INDIRECT: 0.9 mg/dL (ref 0.3–0.9)
Total Bilirubin: 1 mg/dL (ref 0.3–1.2)
Total Protein: 7.4 g/dL (ref 6.5–8.1)

## 2017-04-18 LAB — IRON AND TIBC
Iron: 113 ug/dL (ref 45–182)
SATURATION RATIOS: 44 % — AB (ref 17.9–39.5)
TIBC: 258 ug/dL (ref 250–450)
UIBC: 145 ug/dL

## 2017-04-18 LAB — RETICULOCYTES
RBC.: 3.27 MIL/uL — AB (ref 4.22–5.81)
RETIC COUNT ABSOLUTE: 22.9 10*3/uL (ref 19.0–186.0)
RETIC CT PCT: 0.7 % (ref 0.4–3.1)

## 2017-04-18 LAB — I-STAT CG4 LACTIC ACID, ED: LACTIC ACID, VENOUS: 1.04 mmol/L (ref 0.5–1.9)

## 2017-04-18 LAB — CK: Total CK: 121 U/L (ref 49–397)

## 2017-04-18 LAB — VITAMIN B12: Vitamin B-12: 326 pg/mL (ref 180–914)

## 2017-04-18 LAB — URINALYSIS, ROUTINE W REFLEX MICROSCOPIC
BILIRUBIN URINE: NEGATIVE
GLUCOSE, UA: NEGATIVE mg/dL
Hgb urine dipstick: NEGATIVE
KETONES UR: NEGATIVE mg/dL
Leukocytes, UA: NEGATIVE
NITRITE: NEGATIVE
PH: 7 (ref 5.0–8.0)
Protein, ur: NEGATIVE mg/dL
Specific Gravity, Urine: 1.013 (ref 1.005–1.030)

## 2017-04-18 LAB — FOLATE: FOLATE: 15.5 ng/mL (ref >5.9)

## 2017-04-18 LAB — FERRITIN: FERRITIN: 108 ng/mL (ref 24–336)

## 2017-04-18 LAB — AMMONIA: AMMONIA: 41 umol/L — AB (ref 9–35)

## 2017-04-18 MED ORDER — LACOSAMIDE 50 MG PO TABS
50.0000 mg | ORAL_TABLET | Freq: Two times a day (BID) | ORAL | Status: DC
  Administered 2017-04-18 – 2017-04-29 (×23): 50 mg via ORAL
  Filled 2017-04-18 (×23): qty 1

## 2017-04-18 MED ORDER — LINACLOTIDE 145 MCG PO CAPS
290.0000 ug | ORAL_CAPSULE | Freq: Every day | ORAL | Status: DC
  Administered 2017-04-18: 290 ug via ORAL
  Filled 2017-04-18: qty 2

## 2017-04-18 MED ORDER — BROMOCRIPTINE MESYLATE 2.5 MG PO TABS
5.0000 mg | ORAL_TABLET | Freq: Two times a day (BID) | ORAL | Status: DC
  Administered 2017-04-18 – 2017-04-29 (×23): 5 mg via ORAL
  Filled 2017-04-18 (×24): qty 2

## 2017-04-18 MED ORDER — ACETAMINOPHEN 325 MG PO TABS
650.0000 mg | ORAL_TABLET | Freq: Four times a day (QID) | ORAL | Status: DC | PRN
  Administered 2017-04-19 – 2017-04-22 (×2): 650 mg via ORAL
  Filled 2017-04-18 (×2): qty 2

## 2017-04-18 MED ORDER — QUETIAPINE FUMARATE 100 MG PO TABS
100.0000 mg | ORAL_TABLET | ORAL | Status: DC
  Administered 2017-04-18 – 2017-04-19 (×3): 100 mg via ORAL
  Filled 2017-04-18 (×3): qty 1

## 2017-04-18 MED ORDER — TOPIRAMATE 100 MG PO TABS
100.0000 mg | ORAL_TABLET | Freq: Two times a day (BID) | ORAL | Status: DC
  Administered 2017-04-18 – 2017-04-29 (×23): 100 mg via ORAL
  Filled 2017-04-18 (×25): qty 1

## 2017-04-18 MED ORDER — ENOXAPARIN SODIUM 40 MG/0.4ML ~~LOC~~ SOLN
40.0000 mg | SUBCUTANEOUS | Status: DC
  Administered 2017-04-18 – 2017-04-21 (×3): 40 mg via SUBCUTANEOUS
  Filled 2017-04-18 (×5): qty 0.4

## 2017-04-18 MED ORDER — CLONAZEPAM 0.5 MG PO TABS
0.5000 mg | ORAL_TABLET | Freq: Once | ORAL | Status: AC
  Administered 2017-04-18: 0.5 mg via ORAL
  Filled 2017-04-18: qty 1

## 2017-04-18 MED ORDER — PANTOPRAZOLE SODIUM 40 MG PO TBEC
40.0000 mg | DELAYED_RELEASE_TABLET | Freq: Every day | ORAL | Status: DC
  Administered 2017-04-18 – 2017-04-29 (×12): 40 mg via ORAL
  Filled 2017-04-18 (×13): qty 1

## 2017-04-18 MED ORDER — ONDANSETRON 8 MG PO TBDP
8.0000 mg | ORAL_TABLET | Freq: Three times a day (TID) | ORAL | Status: DC | PRN
  Filled 2017-04-18: qty 1

## 2017-04-18 MED ORDER — POLYETHYLENE GLYCOL 3350 17 G PO PACK: 17.0000 g | PACK | Freq: Every day | ORAL | Status: DC | PRN

## 2017-04-18 MED ORDER — OXCARBAZEPINE 300 MG PO TABS
600.0000 mg | ORAL_TABLET | Freq: Two times a day (BID) | ORAL | Status: DC
  Administered 2017-04-18: 300 mg via ORAL
  Administered 2017-04-18: 600 mg via ORAL
  Administered 2017-04-18: 300 mg via ORAL
  Administered 2017-04-19 – 2017-04-29 (×21): 600 mg via ORAL
  Filled 2017-04-18 (×25): qty 2

## 2017-04-18 MED ORDER — QUETIAPINE FUMARATE 100 MG PO TABS
200.0000 mg | ORAL_TABLET | Freq: Every day | ORAL | Status: DC
  Administered 2017-04-18: 200 mg via ORAL
  Filled 2017-04-18: qty 2

## 2017-04-18 MED ORDER — ENSURE ENLIVE PO LIQD
237.0000 mL | Freq: Three times a day (TID) | ORAL | Status: DC
  Administered 2017-04-18 – 2017-04-29 (×22): 237 mL via ORAL

## 2017-04-18 MED ORDER — CLONAZEPAM 1 MG PO TABS
1.0000 mg | ORAL_TABLET | Freq: Every day | ORAL | Status: DC
  Administered 2017-04-18 – 2017-04-28 (×11): 1 mg via ORAL
  Filled 2017-04-18 (×5): qty 1
  Filled 2017-04-18: qty 2
  Filled 2017-04-18 (×4): qty 1
  Filled 2017-04-18: qty 2

## 2017-04-18 MED ORDER — ESCITALOPRAM OXALATE 10 MG PO TABS
10.0000 mg | ORAL_TABLET | Freq: Every day | ORAL | Status: DC
  Administered 2017-04-18 – 2017-04-19 (×2): 10 mg via ORAL
  Filled 2017-04-18 (×2): qty 1

## 2017-04-18 MED ORDER — ONDANSETRON HCL 4 MG PO TABS: 4.0000 mg | ORAL_TABLET | Freq: Four times a day (QID) | ORAL | Status: DC | PRN

## 2017-04-18 MED ORDER — ACETAMINOPHEN 650 MG RE SUPP: 650.0000 mg | Freq: Four times a day (QID) | RECTAL | Status: DC | PRN

## 2017-04-18 MED ORDER — AMANTADINE HCL 100 MG PO CAPS
100.0000 mg | ORAL_CAPSULE | Freq: Two times a day (BID) | ORAL | Status: DC
  Administered 2017-04-18 – 2017-04-29 (×23): 100 mg via ORAL
  Filled 2017-04-18 (×24): qty 1

## 2017-04-18 MED ORDER — ONDANSETRON HCL 4 MG/2ML IJ SOLN
4.0000 mg | Freq: Four times a day (QID) | INTRAMUSCULAR | Status: DC | PRN
  Administered 2017-04-23 – 2017-04-24 (×2): 4 mg via INTRAVENOUS
  Filled 2017-04-18 (×2): qty 2

## 2017-04-18 NOTE — ED Notes (Signed)
Pt is unable to feed himself or hold a cup. Normally someone helps him eat and drink.  Pt had a cup of water that the RN held for him.

## 2017-04-18 NOTE — Procedures (Signed)
  [facility] Sierra Spargo A. Gerilyn Pilgrimoonquah, MD     [email or web address]           HISTORY: This is a 46 year old man who presents with altered mental status and abnormal movements worrisome for convulsive seizures.  MEDICATIONS: Scheduled Meds: Continuous Infusions: PRN Meds:.  Prior to Admission medications   Medication Sig Start Date End Date Taking? Authorizing Provider  amantadine (SYMMETREL) 100 MG capsule TAKE ONE CAPSULE BY MOUTH TWICE A DAY 01/06/17   [provider]  bromocriptine (PARLODEL) 2.5 MG tablet Take 5 mg by mouth 2 (two) times daily.    [provider]  clonazePAM (KLONOPIN) 1 MG tablet Take 1 mg by mouth at bedtime. 12/24/16   [provider]  escitalopram (LEXAPRO) 10 MG tablet TAKE 1 TABLET BY MOUTH EVERY DAY 01/03/17   [provider]  feeding supplement, ENSURE ENLIVE, (ENSURE ENLIVE) LIQD Take 237 mLs 3 (three) times daily between meals by mouth. 02/28/17   Vassie LollMadera, Carlos, MD  lacosamide (VIMPAT) 50 MG TABS tablet Take 1 tablet (50 mg total) by mouth 2 (two) times daily. 10/18/16   Drema DallasJaffe, Adam R, DO  LINZESS 290 MCG CAPS capsule Take 290 mcg by mouth daily. 11/20/16   [provider]  ondansetron (ZOFRAN ODT) 8 MG disintegrating tablet Take 1 tablet (8 mg total) every 8 (eight) hours as needed by mouth for nausea or vomiting. 02/28/17   Vassie LollMadera, Carlos, MD  oxcarbazepine (TRILEPTAL) 600 MG tablet TAKE 1 TABLET BY MOUTH TWICE A DAY 10/14/16   Everlena CooperJaffe, Adam R, DO  pantoprazole (PROTONIX) 40 MG tablet Take 40 mg by mouth daily.     [provider]  polyethylene glycol (MIRALAX / GLYCOLAX) packet Take 17 g by mouth daily as needed for moderate constipation.    [provider]  potassium chloride 20 MEQ TBCR Take 20 mEq daily by mouth. Patient not taking: Reported on 04/17/2017 02/28/17   Vassie LollMadera, Carlos, MD  QUEtiapine (SEROQUEL) 100 MG tablet Take 100-200 mg by mouth 3 (three) times daily. Take 100mg  by mouth at  10am, 100mg  by mouth at 1400, and 200mg  by mouth at bedtime    [provider]  topiramate (TOPAMAX) 100 MG tablet Take 1 tablet (100 mg total) 2 (two) times daily by mouth. 03/11/17   Everlena CooperJaffe, Adam R, DO      ANALYSIS: A 16 channel recording using standard 10 20 measurements is conducted for 22 minutes.   There is a well-formed posterior dominant rhythm of 8.5 hertz which attenuates with eye opening.  There is beta activity observed in the frontal areas.  Awake and drowsy activities are observed.  Photic stimulation and hyperventilation were not carried out.  There is frequent jerking movements associated with high amplitude activity but no clear associated epileptiform changes.  These are thought to be movement artifacts.  There is no focal or lateralized slowing.   IMPRESSION: 1.  This recording is essentially unremarkable.  The recording shows frequent movement artifacts without electrographic epileptiform correlates.      Israa Caban A. Gerilyn Pilgrimoonquah, M.D.  Diplomate, Biomedical engineerAmerican Board of Psychiatry and Neurology ( Neurology).

## 2017-04-18 NOTE — ED Notes (Signed)
ED TO INPATIENT HANDOFF REPORT  Name/Age/Gender Hector [name] 46 y.o. male  Code Status    Code Status Orders  (From admission, onward)        Start     Ordered   04/18/17 0657  Full code  Continuous     04/18/17 0657    Code Status History    Date Active Date Inactive Code Status Order ID Comments User Context   02/25/2017 20:23 02/28/2017 18:38 Full Code [id number]  Florencia Reasons, MD Inpatient   12/30/2016 14:39 01/01/2017 19:41 Full Code [id number]  Rosita Fire, MD ED   06/01/2015 23:51 06/02/2015 16:01 Full Code [id number]  Gennaro Africa, MD ED   02/13/2015 02:07 02/15/2015 17:20 Full Code [id number]  Toy Baker, MD Inpatient   06/25/2014 23:39 06/27/2014 19:34 Full Code [id number]  Rise Patience, MD Inpatient   12/13/2013 08:56 01/03/2014 15:54 Full Code [id number]  Toy Baker, MD Inpatient   12/12/2013 00:29 12/13/2013 08:56 Full Code [id number]  [name], [name] ED      Home/SNF/Other Home  Chief Complaint Seizures   Level of Care/Admitting Diagnosis ED Disposition    ED Disposition Condition Comment   [facility] Area: [facility] [[id number]]  Level of Care: Telemetry [5]  Admit to tele based on following criteria: Monitor QTC interval  Diagnosis: Coarse tremors [854627]  Admitting Physician: Rise Patience [phone]  Attending Physician: Rise Patience Lei.Right  PT Class (Do Not Modify): Observation [104]  PT Acc Code (Do Not Modify): Observation [10022]       Medical History Past Medical History:  Diagnosis Date  . Cirrhosis ([place])   . Depression   . DVT (deep venous thrombosis) ([facility])    UE, LE DVT during hospitalization  . GERD (gastroesophageal reflux disease)   . H/O ETOH abuse   . H/O renal calculi   . Seizure ([place])   . 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 Ativan [Lorazepam] Other (See Comments)    Pt has  paradoxical effect with sedating medications.   . Lactulose Hives    IV Location/Drains/Wounds Patient Lines/Drains/Airways Status   Active Line/Drains/Airways    Name:   Placement date:   Placement time:   Site:   Days:   Peripheral IV 04/17/17 Left Forearm   04/17/17    2053    Forearm   1          Labs/Imaging Results for orders placed or performed during the hospital encounter of 04/17/17 (from the past 48 hour(s))  CBG monitoring, ED     Status: Abnormal   Collection Time: 04/17/17  9:33 PM  Result Value Ref Range   Glucose-Capillary 104 (H) 65 - 99 mg/dL  CBC with Differential     Status: Abnormal   Collection Time: 04/17/17 10:11 PM  Result Value Ref Range   WBC 9.3 4.0 - 10.5 K/uL   RBC 3.33 (L) 4.22 - 5.81 MIL/uL   Hemoglobin 11.0 (L) 13.0 - 17.0 g/dL   HCT 33.8 (L) 39.0 - 52.0 %   MCV 101.5 (H) 78.0 - 100.0 fL   MCH 33.0 26.0 - 34.0 pg   MCHC 32.5 30.0 - 36.0 g/dL   RDW 13.5 11.5 - 15.5 %   Platelets 113 (L) 150 - 400 K/uL   Neutrophils Relative % 74 %   Lymphocytes Relative 11 %   Monocytes Relative 11 %  Eosinophils Relative 0 %   Basophils Relative 0 %   Band Neutrophils 4 %   Metamyelocytes Relative 0 %   Myelocytes 0 %   Promyelocytes Absolute 0 %   Blasts 0 %   nRBC 0 0 /100 WBC   Other 0 %   Neutro Abs 7.3 1.7 - 7.7 K/uL   Lymphs Abs 1.0 0.7 - 4.0 K/uL   Monocytes Absolute 1.0 0.1 - 1.0 K/uL   Eosinophils Absolute 0.0 0.0 - 0.7 K/uL   Basophils Absolute 0.0 0.0 - 0.1 K/uL   Smear Review MORPHOLOGY UNREMARKABLE   Comprehensive metabolic panel     Status: Abnormal   Collection Time: 04/17/17 10:11 PM  Result Value Ref Range   Sodium 144 135 - 145 mmol/L   Potassium 4.1 3.5 - 5.1 mmol/L   Chloride 115 (H) 101 - 111 mmol/L   CO2 22 22 - 32 mmol/L   Glucose, Bld 104 (H) 65 - 99 mg/dL   BUN 29 (H) 6 - 20 mg/dL   Creatinine, Ser 1.54 (H) 0.61 - 1.24 mg/dL   Calcium 9.1 8.9 - 10.3 mg/dL   Total Protein 7.0 6.5 - 8.1 g/dL   Albumin 3.9 3.5 - 5.0  g/dL   AST 31 15 - 41 U/L   ALT 20 17 - 63 U/L   Alkaline Phosphatase 101 38 - 126 U/L   Total Bilirubin 0.6 0.3 - 1.2 mg/dL   GFR calc non Af Amer 52 (L) >60 mL/min   GFR calc Af Amer 60 (L) >60 mL/min    Comment: (NOTE) The eGFR has been calculated using the CKD EPI equation. This calculation has not been validated in all clinical situations. eGFR's persistently <60 mL/min signify possible Chronic Kidney Disease.    Anion gap 7 5 - 15  Lipase, blood     Status: None   Collection Time: 04/17/17 10:11 PM  Result Value Ref Range   Lipase 29 11 - 51 U/L  Urinalysis, Routine w reflex microscopic     Status: None   Collection Time: 04/17/17 10:11 PM  Result Value Ref Range   Color, Urine YELLOW YELLOW   APPearance CLEAR CLEAR   Specific Gravity, Urine 1.013 1.005 - 1.030   pH 7.0 5.0 - 8.0   Glucose, UA NEGATIVE NEGATIVE mg/dL   Hgb urine dipstick NEGATIVE NEGATIVE   Bilirubin Urine NEGATIVE NEGATIVE   Ketones, ur NEGATIVE NEGATIVE mg/dL   Protein, ur NEGATIVE NEGATIVE mg/dL   Nitrite NEGATIVE NEGATIVE   Leukocytes, UA NEGATIVE NEGATIVE  Magnesium     Status: None   Collection Time: 04/17/17 10:11 PM  Result Value Ref Range   Magnesium 2.2 1.7 - 2.4 mg/dL  I-Stat CG4 Lactic Acid, ED     Status: Abnormal   Collection Time: 04/17/17 10:24 PM  Result Value Ref Range   Lactic Acid, Venous 2.60 (HH) 0.5 - 1.9 mmol/L   Comment NOTIFIED PHYSICIAN   CK     Status: None   Collection Time: 04/17/17 10:24 PM  Result Value Ref Range   Total CK 121 49 - 397 U/L  I-Stat CG4 Lactic Acid, ED     Status: None   Collection Time: 04/18/17 12:11 AM  Result Value Ref Range   Lactic Acid, Venous 1.04 0.5 - 1.9 mmol/L  Ammonia     Status: Abnormal   Collection Time: 04/18/17  2:25 AM  Result Value Ref Range   Ammonia 41 (H) 9 - 35 umol/L   Ct  Head Wo Contrast  Result Date: 04/17/2017 CLINICAL DATA:  Acute onset of tremors and stuttering. EXAM: CT HEAD WITHOUT CONTRAST TECHNIQUE:  Contiguous axial images were obtained from the base of the skull through the vertex without intravenous contrast. COMPARISON:  CT of the head performed 02/25/2017 FINDINGS: Brain: No evidence of acute infarction, hemorrhage, hydrocephalus, extra-axial collection or mass lesion/mass effect. There is chronic ex vacuo dilatation of the left lateral ventricle, reflecting remote infarct. Underlying encephalomalacia is noted at the left parietal and temporal lobes. Small chronic infarcts are noted at the inferior frontal lobes bilaterally. The brainstem and fourth ventricle are within normal limits. No mass effect or midline shift is seen. Vascular: No hyperdense vessel or unexpected calcification. Skull: There is no evidence of fracture; a left parietal craniotomy flap is noted. Sinuses/Orbits: The orbits are within normal limits. Inspissated mucus is noted at the right maxillary sinus. The remaining paranasal sinuses and mastoid air cells are well-aerated. Other: No significant soft tissue abnormalities are seen. IMPRESSION: 1. No acute intracranial pathology seen on CT. 2. 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 Underlying encephalomalacia at the left parietal and temporal lobes. Overlying postoperative change noted. 3. 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 Electronically Signed   By: Garald Balding M.D.   On: 04/17/2017 23:25   Dg Chest Portable 1 View  Result Date: 04/17/2017 CLINICAL DATA:  Tremors and possible infection EXAM: PORTABLE CHEST 1 VIEW COMPARISON:  Chest radiograph 02/25/2017 FINDINGS: The heart size and mediastinal contours are within normal limits. Both lungs are clear. The visualized skeletal structures are unremarkable. IMPRESSION: No active disease. Electronically Signed   By: Ulyses Jarred M.D.   On: 04/17/2017 22:26    Pending Labs Unresulted Labs (From admission, onward)   Start     Ordered   04/25/17 0500   Creatinine, serum  (enoxaparin (LOVENOX)    CrCl >/= 30 ml/min)  Weekly,   R    Comments:  while on enoxaparin therapy    04/18/17 0657   04/18/17 0657  Hepatic function panel  Once,   R     04/18/17 0657   04/18/17 5176  Basic metabolic panel  Once,   R     04/18/17 0657   04/18/17 0657  CBC WITH DIFFERENTIAL  Once,   R     04/18/17 0657   04/18/17 0656  CBC  (enoxaparin (LOVENOX)    CrCl >/= 30 ml/min)  Once,   R    Comments:  Baseline for enoxaparin therapy IF NOT ALREADY DRAWN.  Notify MD if PLT < 100 K.    04/18/17 0657   04/17/17 2154  Urine culture  STAT,   STAT     04/17/17 2154      Vitals/Pain Today's Vitals   04/17/17 2159 04/17/17 2337 04/18/17 0145 04/18/17 0418  BP: (!) 103/56 109/70 (!) 97/40 101/62  Pulse: 94 86 81 87  Resp: (!) 22 (!) _0 Temp:  98.8 F (37.1 C) 98.5 F (36.9 C)   TempSrc:  Oral Oral   SpO2: 99% 98% 96% 95%  Weight:      Height:      PainSc:        Isolation Precautions No active isolations  Medications Medications  amantadine (SYMMETREL) capsule 100 mg (not administered)  bromocriptine (PARLODEL) tablet 5 mg (not administered)  clonazePAM (KLONOPIN) tablet 1 mg (not administered)  feeding supplement (ENSURE ENLIVE) (ENSURE ENLIVE) liquid 237  mL (not administered)  lacosamide (VIMPAT) tablet 50 mg (not administered)  linaclotide (LINZESS) capsule 290 mcg (not administered)  ondansetron (ZOFRAN-ODT) disintegrating tablet 8 mg (not administered)  Oxcarbazepine (TRILEPTAL) tablet 600 mg (not administered)  pantoprazole (PROTONIX) EC tablet 40 mg (not administered)  polyethylene glycol (MIRALAX / GLYCOLAX) packet 17 g (not administered)  QUEtiapine (SEROQUEL) tablet 100-200 mg (not administered)  topiramate (TOPAMAX) tablet 100 mg (not administered)  acetaminophen (TYLENOL) tablet 650 mg (not administered)    Or  acetaminophen (TYLENOL) suppository 650 mg (not administered)  ondansetron (ZOFRAN) tablet 4 mg (not administered)     Or  ondansetron (ZOFRAN) injection 4 mg (not administered)  enoxaparin (LOVENOX) injection 40 mg (not administered)  sodium chloride 0.9 % bolus 1,000 mL (0 mLs Intravenous Stopped 04/17/17 2300)  clonazePAM (KLONOPIN) tablet 0.5 mg (0.5 mg Oral Given 04/18/17 0620)    Mobility ambulatory

## 2017-04-18 NOTE — ED Notes (Signed)
Pt refused to let this RN place him back on the monitor and to place the BP cuff back onto his arm. This RN explained why it was important to monitor him and he still refused.

## 2017-04-18 NOTE — Progress Notes (Signed)
Offsite EEG completed, results pending. 

## 2017-04-18 NOTE — Progress Notes (Addendum)
Patient ID: Hector NianRobert W [name], male   DOB: [birth year], 46 y.o.   MRN: [id number] Patient was admitted early this morning for tremors.  Neurology was consulted.  Patient seen and examined at bedside.  Medical records including this morning's H&P was reviewed by myself.  Awaiting neurology recommendations.  Repeat a.m. Labs.  Will order ultrasound to rule out ascites in this patient with questionable history of cirrhosis.  Addendum: Spoke to neurology/Dr. Wilford CornerArora who recommended that psychiatry be consulted because of patient being on multiple psychiatric medications which would worsen tremors.  Notified on-call psychiatrist Dr. Lolly MustacheArfeen; patient will be seen by Psychiatry tomorrow as per him.

## 2017-04-18 NOTE — ED Provider Notes (Addendum)
  Physical Exam  BP 109/70 (BP Location: Left Arm)   Pulse 86   Temp 98.8 F (37.1 C) (Oral)   Resp (!) 21   Ht 5\' 10"  (1.778 m)   Wt 47.6 kg (105 lb)   SpO2 98%   BMI 15.07 kg/m   Physical Exam  ED Course/Procedures     Procedures  MDM    Assuming care of patient from Dr. Julieanne Mansonegler.   Patient in the ED for seizure like activity. He has hx of TBI, seizures. Workup thus far shows no acute findings, except for Cr elevation.  Concerning findings is that pt is having pretty persistent shaking/ tremors and  Important pending results are CK. CT head is neg.  According to Dr. Julieanne Mansonegler, plan is to admit patient.   Patient had no complains, no concerns from the nursing side. Will continue to monitor.    Derwood KaplanNanavati, Kayveon Lennartz, MD 04/18/17 0022   2:33 AM CK is normal.  I spoke with Mr. Theone StanleyLester Radle, patient's father. He informed me that patient has had increasing shaking over the past few days.  Patient has history of seizures which are similar.  Patient has no LOC.  HE reports that intermittently patient will just start having the shaking spells, each episode lasting for a few minutes.  Further chart review suggests that patient was on multiple medications that were stopped during last ED visit.  Also patient was on Depakote at one point which was stopped.  Patient is currently on clonazepam for shaking episode.  It appears that patient has shaking episodes on top of his seizure disorder.  Patient also has history of liver cirrhosis.  We will order ammonia.  On my exam patient is having myoclonic jerking of all 4 extremities.  Patient is not able to give me any history.  Family reports that mental status is at baseline.     Derwood KaplanNanavati, Rayansh Herbst, MD 04/18/17 0236  5:03 AM I spoke with Dr. Laurence SlateAroor, Neurology about this persistent movement. Patient's ammonia level is high, and Dr. Laurence SlateAroor reported to me that high ammonia certainly can be causative.  I think it would be better for patient to  be admitted.  Patient probably needs to be treated as hepatic encephalopathy.  Neurology can be consulted during the daytime to get their recommendations on medical adjustment.  Social work also appears to be more involved.    Derwood KaplanNanavati, Mckynzi Cammon, MD 04/18/17 [phone]

## 2017-04-18 NOTE — ED Notes (Signed)
Assigned 1516 @7 :01

## 2017-04-18 NOTE — Consult Note (Signed)
Neurology Consultation  Reason for Consult: tremors Referring Physician: Dr Toniann Fail  CC: worsening tremors  History is obtained from:Chart  HPI: Hector [name] is a 46 y.o. 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 Everlena Cooper over at [facility] neurology, who presented to the emergency room for evaluation of shaking episodes.  He lives in a facility and his father was called on the phone saying that he has been having several shaking episodes over the last couple of days.  There was report that the patient was agitated and having full body shaking spells, similar to his seizures in the past.  Patient was reportedly alert and oriented during the shaking spells.  According to the chart review, the father had reported to the ER physicians that these episodes are similar to what happened and the patient had kidney problems and electrolyte abnormalities in the past.  There is no report of preceding fevers, chills, cough, urinary frequency or urgency or burning or diarrhea or GI symptoms.  Patient has been having increasing falls over the past 7-10 days.  Patient is unable to provide any history secondary to his expressive aphasia.  Upon chart review, and the examination of the ER, patient was following all commands and was generally shaky in all 4 extremities.  He was awake during these episodes. His current home medications are amantadine, bromocriptine, clonazepam, Lexapro, Vimpat 50 mg twice daily, Linzess, Zofran, Trileptal 600 mg twice daily, Protonix, MiraLAX, Seroquel 100 mg at 10 AM, 100 mg at 2 PM and 200 mg at bedtime, Topamax 100 mg twice daily.  ROS: Unable to obtain due to altered mental status.   Past Medical History:  Diagnosis Date  . Cirrhosis ([facility])   . Depression   . DVT (deep venous thrombosis) ([facility])    UE, LE DVT during  hospitalization  . GERD (gastroesophageal reflux disease)   . H/O ETOH abuse   . H/O renal calculi   . Seizure ([facility])   . TBI (traumatic brain injury) ([facility])    07/2013, fall from standing post seizure    Family History  Problem Relation Age of Onset  . Hypercholesterolemia Mother   . Hypertension Mother   . Congestive Heart Failure Brother        Deceased age 64    Social History:   reports that he has been smoking cigarettes.  He has a 5.00 pack-year smoking history. he has never used smokeless tobacco. He reports that he drinks alcohol. He reports that he does not use drugs.  Medications  Current Facility-Administered Medications:  .  acetaminophen (TYLENOL) tablet 650 mg, 650 mg, Oral, Q6H PRN **OR** acetaminophen (TYLENOL) suppository 650 mg, 650 mg, Rectal, Q6H PRN, Eduard Clos, MD .  amantadine (SYMMETREL) capsule 100 mg, 100 mg, Oral, BID, Eduard Clos, MD, 100 mg at 04/18/17 0945 .  bromocriptine (PARLODEL) tablet 5 mg, 5 mg, Oral, BID, Eduard Clos, MD, 5 mg at 04/18/17 0946 .  clonazePAM (KLONOPIN) tablet 1 mg, 1 mg, Oral, QHS, Midge Minium N, MD .  enoxaparin (LOVENOX) injection 40 mg, 40 mg, Subcutaneous, Q24H, Eduard Clos, MD, 40 mg at 04/18/17 0946 .  escitalopram (LEXAPRO) tablet 10 mg, 10 mg, Oral, Daily, Eduard Clos, MD, 10 mg at 04/18/17 0945 .  feeding supplement (ENSURE ENLIVE) (ENSURE ENLIVE) liquid 237 mL, 237 mL, Oral, TID BM, Eduard Clos,  MD, 237 mL at 04/18/17 0957 .  lacosamide (VIMPAT) tablet 50 mg, 50 mg, Oral, BID, Eduard ClosKakrakandy, Arshad N, MD, 50 mg at 04/18/17 0945 .  linaclotide (LINZESS) capsule 290 mcg, 290 mcg, Oral, Daily, Eduard ClosKakrakandy, Arshad N, MD, 290 mcg at 04/18/17 0946 .  ondansetron (ZOFRAN) tablet 4 mg, 4 mg, Oral, Q6H PRN **OR** ondansetron (ZOFRAN) injection 4 mg, 4 mg, Intravenous, Q6H PRN, Eduard ClosKakrakandy, Arshad N, MD .  Oxcarbazepine (TRILEPTAL) tablet 600 mg, 600 mg, Oral, BID, Eduard ClosKakrakandy,  Arshad N, MD, 300 mg at 04/18/17 1031 .  pantoprazole (PROTONIX) EC tablet 40 mg, 40 mg, Oral, Daily, Eduard ClosKakrakandy, Arshad N, MD, 40 mg at 04/18/17 0945 .  polyethylene glycol (MIRALAX / GLYCOLAX) packet 17 g, 17 g, Oral, Daily PRN, Eduard ClosKakrakandy, Arshad N, MD .  QUEtiapine (SEROQUEL) tablet 100 mg, 100 mg, Oral, 2 times per day, Eduard ClosKakrakandy, Arshad N, MD, 100 mg at 04/18/17 1014 .  QUEtiapine (SEROQUEL) tablet 200 mg, 200 mg, Oral, QHS, Aleda GranaZeigler, Dustin G, RPH .  topiramate (TOPAMAX) tablet 100 mg, 100 mg, Oral, BID, Eduard ClosKakrakandy, Arshad N, MD, 100 mg at 04/18/17 0945   Exam: Current vital signs: BP (!) 143/106 (BP Location: Right Arm)   Pulse 88   Temp 97.6 F (36.4 C) (Oral)   Resp 20   Ht 5\' 10"  (1.778 m)   Wt 47.6 kg (105 lb)   SpO2 94%   BMI 15.07 kg/m   Vital signs in last 24 hours: Temp:  [97.6 F (36.4 C)-98.8 F (37.1 C)] 97.6 F (36.4 C) (12/24 0846) Pulse Rate:  [80-94] 88 (12/24 0846) Resp:  [16-22] 20 (12/24 0846) BP: (97-152)/(40-114) 143/106 (12/24 0846) SpO2:  [94 %-99 %] 94 % (12/24 0846) Weight:  [47.6 kg (105 lb)] 47.6 kg (105 lb) (12/23 2054) GENERAL: Awake, alert in NAD HEENT: - Normocephalic and atraumatic, dry mm, no LN++, no Thyromegally LUNGS - Clear to auscultation bilaterally with no wheezes CV - S1S2 RRR, no m/r/g, equal pulses bilaterally. ABDOMEN - Soft, nontender, nondistended with normoactive BS Ext: warm, well perfused, intact peripheral pulses, no edema  NEURO:  Mental Status: Awake alert, oriented to self. Language: He is able to follow simple commands but has severe expressive aphasia.  Unable to name, repeat.  Speech is unintelligible.   Cranial Nerves: PERRL. EOMI, visual fields full to threat, right-sided facial asymmetry with right facial drooping of the lower face Motor: Increased tone in all 4 extremities.  He has resting tremor and intention tremor, large amplitude in all 4 extremities.  He also has spontaneous nonrhythmic movements in all 4  extremities Sensation- Intact to light touch bilaterally Coordination: Dysmetric finger nose finger.  Bradykinetic in both hands. Gait- deferred  Labs I have reviewed labs in epic and the results pertinent to this consultation are: Hemoglobin 11.1, platelet count 109, BUN 26, creatinine 1.48, GFR 54, AST ALT normal. Ammonia level elevated at 44.  Prior ammonia levels were within normal range.  CBC    Component Value Date/Time   WBC 8.1 04/18/2017 0745   RBC 3.27 (L) 04/18/2017 0745   RBC 3.27 (L) 04/18/2017 0745   HGB 11.1 (L) 04/18/2017 0745   HCT 33.4 (L) 04/18/2017 0745   PLT 109 (L) 04/18/2017 0745   MCV 102.1 (H) 04/18/2017 0745   MCH 33.9 04/18/2017 0745   MCHC 33.2 04/18/2017 0745   RDW 13.5 04/18/2017 0745   LYMPHSABS 1.5 04/18/2017 0745   MONOABS 1.0 04/18/2017 0745   EOSABS 0.1 04/18/2017 0745   BASOSABS  0.0 04/18/2017 0745    CMP     Component Value Date/Time   NA 143 04/18/2017 0745   K 3.8 04/18/2017 0745   CL 115 (H) 04/18/2017 0745   CO2 18 (L) 04/18/2017 0745   GLUCOSE 83 04/18/2017 0745   BUN 26 (H) 04/18/2017 0745   CREATININE 1.48 (H) 04/18/2017 0745   CALCIUM 9.2 04/18/2017 0745   PROT 7.4 04/18/2017 0745   ALBUMIN 4.2 04/18/2017 0745   AST 39 04/18/2017 0745   ALT 21 04/18/2017 0745   ALKPHOS 113 04/18/2017 0745   BILITOT 1.0 04/18/2017 0745   GFRNONAA 54 (L) 04/18/2017 0745   GFRAA >60 04/18/2017 0745   Imaging I have reviewed the images obtained: CT-scan of the brain showed no acute intracranial pathology.  There is chronic ex vacuo dilatation of the left lateral ventricle suggestive of a remote infarct with underlying encephalomalacia at the left parietal and temporal lobes and overlying postoperative changes.  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  Assessment:  46 year old right-handed man with past medical history of seizure disorders alcoholism and cirrhosis with a seizure disorder that started status post TBI  subdural hematoma, patient of Dr. Everlena CooperJaffe, presenting for worsening shaking episodes all over as well as frequent falls. He has not lost consciousness during any of these episodes and is generally shaky even at rest. On my examination, he has increased tone in all 4 extremities with resting and action tremor as well as expressive aphasia which is severe and mild receptive aphasia as well-based on Dr. Moises BloodJaffe's prior notes, this is nearly at baseline. His tone is increased and he also has a slight right facial droop. All of these findings could reflect worsening of his TBI related symptoms in the setting of acute toxic metabolic derangements. Another important consideration in this gentleman's case is possible side effects of drugs and drug interactions including serotonin syndrome. He is on Lexapro which is SSRI.  He is also on Seroquel which can cause a lot of extrapyramidal side effects including restlessness, tremors and increased tone.  Less common side effect of medications like Linzess or tremulousness as well. Another offending agent could be from Bromocryptine causing ataxia as well as involuntary movements. All these medication side effects could also have been exacerbated because of the underlying acute kidney injury.  Impression: Evaluate for toxic metabolic encephalopathy Evaluate for serotonin syndrome Polypharmacy  Recommendations: -I would recommend obtaining an MRI to ensure that there is no structural abnormality. -I would also recommend obtaining a routine EEG -I would recommend a pharmacy consult for optimization of his medications.  I would recommend decreasing the dose of Lexapro as well as Seroquel. -I would also recommend decreasing or discontinuing Linzess. -Decrease dose of bromocriptine. -Psychiatry consultation for the optimization of medications -We will follow-up with you after the MRI and EEG are completed.  Please call with questions.  -- Milon DikesAshish Malayla Granberry, MD [facility]uro[facility]ist [phone] If 7pm to 7am, please call on call as listed on AMION.

## 2017-04-18 NOTE — H&P (Addendum)
History and Physical    Hector [name] NFA:[id number] DOB: 1970/11/22 DOA: 04/17/2017  PCP: [name], [name], MD  Patient coming from: Home.  Chief Complaint: Tremors.  HPI: Hector [name] is a 46 y.o. male with history of traumatic brain injury, seizure disorder, alcoholic liver cirrhosis was brought to the ER by patient's father after patient was found to have increasing more than usual for the last 1 week.  Patient also had difficulty walking.  Did not have any change in medications except that his Lasix and spironolactone was discontinued during last admission in November.  Patient has not had any nausea vomiting abdominal pain diarrhea chest pain shortness of breath fever chills.  ED Course: In the ER on exam patient had coarse tremors involving all extremities.  Patient  has not lost consciousness during these episodes.  As per the father patient also had not lost consciousness at home also.  Is following commands.  Moves all extremities.  Patient is a phasic at baseline due to traumatic brain injury.  CT of the head was done which did not show anything acute.  Lab work show creatinine increased from recent past.  Lactate also was elevated.  Following which patient's lactate improved.  Patient was given 1 L fluid bolus.  Ammonia level is around 41.  Patient is allergic to lactulose.  Patient is admitted for further observation.  Review of Systems: As per HPI, rest all negative.   Past Medical History:  Diagnosis Date  . Cirrhosis (HCC)   . Depression   . DVT (deep venous thrombosis) (HCC)    UE, LE DVT during hospitalization  . GERD (gastroesophageal reflux disease)   . H/O ETOH abuse   . H/O renal calculi   . Seizure (HCC)   . TBI (traumatic brain injury) (HCC)    07/2013, fall from standing post seizure    Past Surgical History:  Procedure Laterality Date  . BRAIN SURGERY    . GASTROSTOMY TUBE PLACEMENT    . REMOVAL OF GASTROSTOMY TUBE    . TRACHEOSTOMY     feinstein     reports that he has been smoking cigarettes.  He has a 5.00 pack-year smoking history. he has never used smokeless tobacco. He reports that he drinks alcohol. He reports that he does not use drugs.  Allergies  Allergen Reactions  . Ativan [Lorazepam] Other (See Comments)    Pt has paradoxical effect with sedating medications.   . Lactulose Hives    Family History  Problem Relation Age of Onset  . Hypercholesterolemia Mother   . Hypertension Mother   . Congestive Heart Failure Brother        Deceased age 48    Prior to Admission medications   Medication Sig Start Date End Date Taking? Authorizing Provider  amantadine (SYMMETREL) 100 MG capsule TAKE ONE CAPSULE BY MOUTH TWICE A DAY 01/06/17  Yes [provider]  bromocriptine (PARLODEL) 2.5 MG tablet Take 5 mg by mouth 2 (two) times daily.   Yes [provider]  clonazePAM (KLONOPIN) 1 MG tablet Take 1 mg by mouth at bedtime. 12/24/16  Yes [provider]  escitalopram (LEXAPRO) 10 MG tablet TAKE 1 TABLET BY MOUTH EVERY DAY 01/03/17  Yes [provider]  feeding supplement, ENSURE ENLIVE, (ENSURE ENLIVE) LIQD Take 237 mLs 3 (three) times daily between meals by mouth. 02/28/17  Yes Vassie LollMadera, Carlos, MD  lacosamide (VIMPAT) 50 MG TABS tablet Take 1 tablet (50 mg total) by mouth  2 (two) times daily. 10/18/16  Yes Jaffe, Adam R, DO  LINZESS 290 MCG CAPS capsule Take 290 mcg by mouth daily. 11/20/16  Yes [provider]  ondansetron (ZOFRAN ODT) 8 MG disintegrating tablet Take 1 tablet (8 mg total) every 8 (eight) hours as needed by mouth for nausea or vomiting. 02/28/17  Yes Vassie Loll, MD  oxcarbazepine (TRILEPTAL) 600 MG tablet TAKE 1 TABLET BY MOUTH TWICE A DAY 10/14/16  Yes Jaffe, Adam R, DO  pantoprazole (PROTONIX) 40 MG tablet Take 40 mg by mouth daily.    Yes [provider]  polyethylene glycol (MIRALAX / GLYCOLAX) packet Take 17 g by mouth daily as needed for moderate  constipation.   Yes [provider]  QUEtiapine (SEROQUEL) 100 MG tablet Take 100-200 mg by mouth 3 (three) times daily. Take 100mg  by mouth at 10am, 100mg  by mouth at 1400, and 200mg  by mouth at bedtime   Yes [provider]  topiramate (TOPAMAX) 100 MG tablet Take 1 tablet (100 mg total) 2 (two) times daily by mouth. 03/11/17  Yes Jaffe, Adam R, DO  potassium chloride 20 MEQ TBCR Take 20 mEq daily by mouth. Patient not taking: Reported on 04/17/2017 02/28/17   Vassie Loll, MD    Physical Exam: Vitals:   04/17/17 2159 04/17/17 2337 04/18/17 0145 04/18/17 0418  BP: (!) 103/56 109/70 (!) 97/40 101/62  Pulse: 94 86 81 87  Resp: (!) 22 (!) 21 17 20   Temp:  98.8 F (37.1 C) 98.5 F (36.9 C)   TempSrc:  Oral Oral   SpO2: 99% 98% 96% 95%  Weight:      Height:          Constitutional: Moderately built and nourished. Vitals:   04/17/17 2159 04/17/17 2337 04/18/17 0145 04/18/17 0418  BP: (!) 103/56 109/70 (!) 97/40 101/62  Pulse: 94 86 81 87  Resp: (!) 22 (!) 21 17 20   Temp:  98.8 F (37.1 C) 98.5 F (36.9 C)   TempSrc:  Oral Oral   SpO2: 99% 98% 96% 95%  Weight:      Height:       Eyes: Anicteric no pallor. ENMT: No discharge from the ears eyes nose or mouth. Neck: No mass felt.  No neck rigidity. Respiratory: No rhonchi or crepitations. Cardiovascular: S1-S2 heard no murmurs appreciated. Abdomen: Soft nontender bowel sounds present. Musculoskeletal: No edema.  No joint effusion. Skin: No rash.  Skin appears warm.   Neurologic: Alert awake patient is at baseline aphasic.  Follows commands moves all extremities has episodes of tremors which involves all extremities.  Pupils are reacting to light. Psychiatric: Patient is at baseline aphasic.   Labs on Admission: I have personally reviewed following labs and imaging studies  CBC: Recent Labs  Lab 04/17/17 2211  WBC 9.3  NEUTROABS 7.3  HGB 11.0*  HCT 33.8*  MCV 101.5*  PLT 113*   Basic Metabolic  Panel: Recent Labs  Lab 04/17/17 2211  NA 144  K 4.1  CL 115*  CO2 22  GLUCOSE 104*  BUN 29*  CREATININE 1.54*  CALCIUM 9.1  MG 2.2   GFR: Estimated Creatinine Clearance: 39.5 mL/min (A) (by C-G formula based on SCr of 1.54 mg/dL (H)). Liver Function Tests: Recent Labs  Lab 04/17/17 2211  AST 31  ALT 20  ALKPHOS 101  BILITOT 0.6  PROT 7.0  ALBUMIN 3.9   Recent Labs  Lab 04/17/17 2211  LIPASE 29   Recent Labs  Lab 04/18/17  0225  AMMONIA 41*   Coagulation Profile: No results for input(s): INR, PROTIME in the last 168 hours. Cardiac Enzymes: Recent Labs  Lab 04/17/17 2224  CKTOTAL 121   BNP (last 3 results) No results for input(s): PROBNP in the last 8760 hours. HbA1C: No results for input(s): HGBA1C in the last 72 hours. CBG: Recent Labs  Lab 04/17/17 2133  GLUCAP 104*   Lipid Profile: No results for input(s): CHOL, HDL, LDLCALC, TRIG, CHOLHDL, LDLDIRECT in the last 72 hours. Thyroid Function Tests: No results for input(s): TSH, T4TOTAL, FREET4, T3FREE, THYROIDAB in the last 72 hours. Anemia Panel: No results for input(s): VITAMINB12, FOLATE, FERRITIN, TIBC, IRON, RETICCTPCT in the last 72 hours. Urine analysis:    Component Value Date/Time   COLORURINE YELLOW 04/17/2017 2211   APPEARANCEUR CLEAR 04/17/2017 2211   LABSPEC 1.013 04/17/2017 2211   PHURINE 7.0 04/17/2017 2211   GLUCOSEU NEGATIVE 04/17/2017 2211   HGBUR NEGATIVE 04/17/2017 2211   BILIRUBINUR NEGATIVE 04/17/2017 2211   KETONESUR NEGATIVE 04/17/2017 2211   PROTEINUR NEGATIVE 04/17/2017 2211   UROBILINOGEN 1.0 02/12/2015 1912   NITRITE NEGATIVE 04/17/2017 2211   LEUKOCYTESUR NEGATIVE 04/17/2017 2211   Sepsis Labs: @LABRCNTIP (procalcitonin:4,lacticidven:4) )No results found for this or any previous visit (from the past 240 hour(s)).   Radiological Exams on Admission: Ct Head Wo Contrast  Result Date: 04/17/2017 CLINICAL DATA:  Acute onset of tremors and stuttering. EXAM: CT  HEAD WITHOUT CONTRAST TECHNIQUE: Contiguous axial images were obtained from the base of the skull through the vertex without intravenous contrast. COMPARISON:  CT of the head performed 02/25/2017 FINDINGS: Brain: No evidence of acute infarction, hemorrhage, hydrocephalus, extra-axial collection or mass lesion/mass effect. There is chronic ex vacuo dilatation of the left lateral ventricle, reflecting remote infarct. Underlying encephalomalacia is noted at the left parietal and temporal lobes. Small chronic infarcts are noted at the inferior frontal lobes bilaterally. The brainstem and fourth ventricle are within normal limits. No mass effect or midline shift is seen. Vascular: No hyperdense vessel or unexpected calcification. Skull: There is no evidence of fracture; a left parietal craniotomy flap is noted. Sinuses/Orbits: The orbits are within normal limits. Inspissated mucus is noted at the right maxillary sinus. The remaining paranasal sinuses and mastoid air cells are well-aerated. Other: No significant soft tissue abnormalities are seen. IMPRESSION: 1. No acute intracranial pathology seen on CT. 2. 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 Underlying encephalomalacia at the left parietal and temporal lobes. Overlying postoperative change noted. 3. 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 Electronically Signed   By: Roanna Raider M.D.   On: 04/17/2017 23:25   Dg Chest Portable 1 View  Result Date: 04/17/2017 CLINICAL DATA:  Tremors and possible infection EXAM: PORTABLE CHEST 1 VIEW COMPARISON:  Chest radiograph 02/25/2017 FINDINGS: The heart size and mediastinal contours are within normal limits. Both lungs are clear. The visualized skeletal structures are unremarkable. IMPRESSION: No active disease. Electronically Signed   By: Deatra Robinson M.D.   On: 04/17/2017 22:26     Assessment/Plan Principal Problem:   Coarse tremors Active  Problems:   Alcoholic cirrhosis of liver without ascites (HCC)   Seizure (HCC)   ARF (acute renal failure) (HCC)    1. Tremors -I have consulted neurologist for further recommendations.  Patient is on clonazepam and also on antiseizure medications.  Tremors look like most likely myoclonic.  Does not look like patient has active seizures.  Will wait for further  recommendations by neurologist. 2. Liver cirrhosis -patient's Lasix and spironolactone was discontinued last month for dehydration.  Patient's creatinine is increased from baseline so will hold off any diuretics for now.  Patient did receive 1 L fluid bolus.  Ammonia level is mildly elevated.  Since patient is allergic to lactulose I have requested oncoming hospitalist to discuss with gastroenterologist for other alternatives.  Follow ammonia levels. 3. Acute on chronic renal failure -creatinine has increased from baseline.  Received 1 L fluid bolus.  Recheck metabolic panel.  Lactic acid is improved. 4. Seizure disorder on Vimpat Topamax and Trileptal.  Trileptal levels pending. 5. Macrocytic anemia -check anemia panel. 6. History of depression continue home medications.  EKG pending.   DVT prophylaxis: Lovenox. Code Status: Full code. Family Communication: Patient's father. Disposition Plan: Home. Consults called: Neurology. Admission status: Observation.   Eduard ClosArshad N Jerian Morais MD [facility] Pager [phone].  If 7PM-7AM, please contact night-coverage [email or web address] Password [email or web address]  04/18/2017, 6:57 AM

## 2017-04-19 ENCOUNTER — Inpatient Hospital Stay (HOSPITAL_COMMUNITY): Payer: Medicaid Other

## 2017-04-19 DIAGNOSIS — F17210 Nicotine dependence, cigarettes, uncomplicated: Secondary | ICD-10-CM

## 2017-04-19 DIAGNOSIS — R413 Other amnesia: Secondary | ICD-10-CM

## 2017-04-19 DIAGNOSIS — N183 Chronic kidney disease, stage 3 (moderate): Secondary | ICD-10-CM

## 2017-04-19 DIAGNOSIS — G252 Other specified forms of tremor: Secondary | ICD-10-CM

## 2017-04-19 DIAGNOSIS — R569 Unspecified convulsions: Secondary | ICD-10-CM

## 2017-04-19 LAB — CBC WITH DIFFERENTIAL/PLATELET
Basophils Absolute: 0 10*3/uL (ref 0.0–0.1)
Basophils Relative: 0 %
EOS ABS: 0 10*3/uL (ref 0.0–0.7)
EOS PCT: 0 %
HCT: 32.6 % — ABNORMAL LOW (ref 39.0–52.0)
HEMOGLOBIN: 10.6 g/dL — AB (ref 13.0–17.0)
LYMPHS PCT: 11 %
Lymphs Abs: 1.3 10*3/uL (ref 0.7–4.0)
MCH: 33.1 pg (ref 26.0–34.0)
MCHC: 32.5 g/dL (ref 30.0–36.0)
MCV: 101.9 fL — AB (ref 78.0–100.0)
MONO ABS: 1.2 10*3/uL — AB (ref 0.1–1.0)
Monocytes Relative: 10 %
NEUTROS PCT: 79 %
Neutro Abs: 9 10*3/uL — ABNORMAL HIGH (ref 1.7–7.7)
PLATELETS: 103 10*3/uL — AB (ref 150–400)
RBC: 3.2 MIL/uL — AB (ref 4.22–5.81)
RDW: 13.3 % (ref 11.5–15.5)
WBC: 11.5 10*3/uL — AB (ref 4.0–10.5)

## 2017-04-19 LAB — COMPREHENSIVE METABOLIC PANEL
ALK PHOS: 121 U/L (ref 38–126)
ALT: 24 U/L (ref 17–63)
AST: 35 U/L (ref 15–41)
Albumin: 4 g/dL (ref 3.5–5.0)
Anion gap: 5 (ref 5–15)
BUN: 36 mg/dL — AB (ref 6–20)
CHLORIDE: 116 mmol/L — AB (ref 101–111)
CO2: 24 mmol/L (ref 22–32)
CREATININE: 1.43 mg/dL — AB (ref 0.61–1.24)
Calcium: 9.4 mg/dL (ref 8.9–10.3)
GFR calc Af Amer: >60 mL/min (ref >60)
GFR calc non Af Amer: 57 mL/min — ABNORMAL LOW (ref >60)
Glucose, Bld: 120 mg/dL — ABNORMAL HIGH (ref 65–99)
Potassium: 4 mmol/L (ref 3.5–5.1)
SODIUM: 145 mmol/L (ref 135–145)
Total Bilirubin: 1 mg/dL (ref 0.3–1.2)
Total Protein: 7.2 g/dL (ref 6.5–8.1)

## 2017-04-19 LAB — URINE CULTURE: Culture: NO GROWTH

## 2017-04-19 LAB — MAGNESIUM: Magnesium: 2.3 mg/dL (ref 1.7–2.4)

## 2017-04-19 MED ORDER — ESCITALOPRAM OXALATE 10 MG PO TABS
5.0000 mg | ORAL_TABLET | Freq: Every day | ORAL | Status: DC
  Administered 2017-04-20 – 2017-04-29 (×10): 5 mg via ORAL
  Filled 2017-04-19 (×10): qty 1

## 2017-04-19 MED ORDER — LORAZEPAM 2 MG/ML IJ SOLN: 0.5000 mg | Freq: Once | INTRAMUSCULAR | Status: DC

## 2017-04-19 NOTE — Progress Notes (Signed)
Pt has episode of violent tremors lasting approximately one minute. Pt was responsive during episode, did not appear to be postictal once tremors stopped. Vitals signs taken and read as follows: BP: 118/57 O2 saturation: 95% Pulse 109 Respirations: 22 Temperature: 98.7 axillary. Will continue to monitor pt closely at this time.

## 2017-04-19 NOTE — Consult Note (Signed)
[facility] Face-to-Face Psychiatry Consult   Reason for Consult:  Tremors Referring Physician:  Dr Rory [name] Patient Identification: Hector [name] MRN:  [id number] Principal Diagnosis: Coarse tremors Diagnosis:   Patient Active Problem List   Diagnosis Date Noted  . Coarse tremors [G25.2] 04/18/2017  . ARF (acute renal failure) ([place]) [N17.9] 04/18/2017  . Hypokalemia [E87.6]   . Hypomagnesemia [E83.42]   . Physical deconditioning [R53.81]   . FTT (failure to thrive) in adult [R62.7] 02/25/2017  . Oropharyngeal dysphagia [R13.12] 01/01/2017  . Orthostatic hypotension [I95.1] 01/01/2017  . Failure to thrive in adult [R62.7] 12/31/2016  . Vomiting [R11.10]   . Seizure ([place]) [R56.9] 06/01/2015  . Alcoholic cirrhosis of liver without ascites ([place]) [K70.30]   . Dehydration [E86.0]   . Encephalopathy acute [G93.40] 02/13/2015  . Altered mental status [R41.82]   . Ataxia [R27.0] 02/12/2015  . Localization-related symptomatic epilepsy and epileptic syndromes with complex partial seizures, not intractable, without status epilepticus ([place]) [G40.209] 08/16/2014  . Tobacco abuse [Z72.0] 08/16/2014  . Acute kidney injury superimposed on CKD ([place]) [N17.9, N18.9] 06/25/2014  . Dysphagia [R13.10] 06/25/2014  . Centrilobular emphysema ([place]) [J43.2] 01/27/2014  . Tracheostomy in place [facility]) [Z93.0] 01/27/2014  . Encounter for family conference with patient present [Z71.89] 01/23/2014  . Dvt femoral (deep venous thrombosis) ([place]) [I82.419] 01/05/2014  . Protein-calorie malnutrition, severe ([place]) [E43] 01/05/2014  . Alcohol abuse [F10.10] 01/05/2014  . Acute respiratory failure with hypoxia ([place]) [J96.01] 12/20/2013  . Delirium due to general medical condition [F05] 12/13/2013  . HCAP (healthcare-associated pneumonia) [J18.9] 12/13/2013  . SIRS (systemic inflammatory response syndrome) (HCC) [R65.10] 12/13/2013  . TBI (traumatic brain injury) [facility]) [S06.9X9A] 12/12/2013    Total Time spent with patient:  30 minutes  Subjective:   Hector [name] is a 46 y.o. male patient admitted with worsening tremors.  HPI: Patient is 46 year old Caucasian man with past medical history of seizure disorder alcoholism cirrhosis, status post TBI with subdural hematoma admitted due to worsening of tremors.  Patient seen by neurology.  Chart reviewed and I spoke to his father.  Father mentioned that his tremor has been worsening for the past few days.  Patient has TBI in 2015 which was resulted after an a seizure episode and patient hit his head.  Patient is taking Seroquel, Lexapro.  Patient is unable to provide any history and most of the information was obtained through his father and electronic medical record.  Father mentioned that he has tremors for a long time but lately these are getting worse.  He is taking Seroquel Klonopin Lexapro and multiple seizures medication.  Her father is not aware that if he is seeing any psychiatrist.  As per him he was given psychiatric medication from his medical doctor because of agitation and anxiety.  There has been no changes in his psychiatric medication in recent months.  Patient has a aphasia but he was able to follow all commands and his father mentioned that he can understand certain things.  He was noticed shaking all his extremities.  His current medicines are Trileptal, Zofran, Protonix, Seroquel, Topamax, Klonopin, bromocriptine, Lexapro, Vimpat.  His comprehensive metabolic panel shows chloride 116, glucose 120, BUN 16 and creatinine 1.43.  CBC shows WBC count 11.5, RBC 3.2 hematocrit 32 and platelet 103.  Denies any suicidal thoughts or any hallucination.  He knows that he is in the hospital but unable to provide any more relevant information.  Past Psychiatric History: Patient is taking Seroquel Lexapro since 2015 after TBI.  These medicines are given by his primary care physician.  There is no history of suicidal attempt or psychosis however as per father he has history of  agitation and anxiety.  Risk to Self: Is patient at risk for suicide?: No Risk to Others:   Prior Inpatient Therapy:   Prior Outpatient Therapy:    Past Medical History:  Past Medical History:  Diagnosis Date  . Cirrhosis ([place])   . Depression   . DVT (deep venous thrombosis) (HCC)    UE, LE DVT during hospitalization  . GERD (gastroesophageal reflux disease)   . H/O ETOH abuse   . H/O renal calculi   . Seizure ([place])   . TBI (traumatic brain injury) ([place])    07/2013, fall from standing post seizure    Past Surgical History:  Procedure Laterality Date  . BRAIN SURGERY    . GASTROSTOMY TUBE PLACEMENT    . REMOVAL OF GASTROSTOMY TUBE    . TRACHEOSTOMY     feinstein   Family History:  Family History  Problem Relation Age of Onset  . Hypercholesterolemia Mother   . Hypertension Mother   . Congestive Heart Failure Brother        Deceased age 71   Family Psychiatric  History: Reviewed Social History:  Social History   Substance and Sexual Activity  Alcohol Use Yes  . Alcohol/week: 0.0 oz     Social History   Substance and Sexual Activity  Drug Use No    Social History   Socioeconomic History  . Marital status: Single    Spouse name: None  . Number of children: None  . Years of education: None  . Highest education level: None  Social Needs  . Financial resource strain: None  . Food insecurity - worry: None  . Food insecurity - inability: None  . Transportation needs - medical: None  . Transportation needs - non-medical: None  Occupational History  . None  Tobacco Use  . Smoking status: Current Some Day Smoker    Packs/day: 0.25    Years: 20.00    Pack years: 5.00    Types: Cigarettes  . Smokeless tobacco: Never Used  Substance and Sexual Activity  . Alcohol use: Yes    Alcohol/week: 0.0 oz  . Drug use: No  . Sexual activity: None  Other Topics Concern  . None  Social History Narrative  . None   Additional Social History:    Allergies:    Allergies  Allergen Reactions  . Ativan [Lorazepam] Other (See Comments)    Pt has paradoxical effect with sedating medications.   . Lactulose Hives    Labs:  Results for orders placed or performed during the hospital encounter of 04/17/17 (from the past 48 hour(s))  CBG monitoring, ED     Status: Abnormal   Collection Time: 04/17/17  9:33 PM  Result Value Ref Range   Glucose-Capillary 104 (H) 65 - 99 mg/dL  CBC with Differential     Status: Abnormal   Collection Time: 04/17/17 10:11 PM  Result Value Ref Range   WBC 9.3 4.0 - 10.5 K/uL   RBC 3.33 (L) 4.22 - 5.81 MIL/uL   Hemoglobin 11.0 (L) 13.0 - 17.0 g/dL   HCT 33.8 (L) 39.0 - 52.0 %   MCV 101.5 (H) 78.0 - 100.0 fL   MCH 33.0 26.0 - 34.0 pg   MCHC 32.5 30.0 - 36.0 g/dL   RDW 13.5 11.5 - 15.5 %   Platelets 113 (L) 150 -  400 K/uL   Neutrophils Relative % 74 %   Lymphocytes Relative 11 %   Monocytes Relative 11 %   Eosinophils Relative 0 %   Basophils Relative 0 %   Band Neutrophils 4 %   Metamyelocytes Relative 0 %   Myelocytes 0 %   Promyelocytes Absolute 0 %   Blasts 0 %   nRBC 0 0 /100 WBC   Other 0 %   Neutro Abs 7.3 1.7 - 7.7 K/uL   Lymphs Abs 1.0 0.7 - 4.0 K/uL   Monocytes Absolute 1.0 0.1 - 1.0 K/uL   Eosinophils Absolute 0.0 0.0 - 0.7 K/uL   Basophils Absolute 0.0 0.0 - 0.1 K/uL   Smear Review MORPHOLOGY UNREMARKABLE   Comprehensive metabolic panel     Status: Abnormal   Collection Time: 04/17/17 10:11 PM  Result Value Ref Range   Sodium 144 135 - 145 mmol/L   Potassium 4.1 3.5 - 5.1 mmol/L   Chloride 115 (H) 101 - 111 mmol/L   CO2 22 22 - 32 mmol/L   Glucose, Bld 104 (H) 65 - 99 mg/dL   BUN 29 (H) 6 - 20 mg/dL   Creatinine, Ser 1.54 (H) 0.61 - 1.24 mg/dL   Calcium 9.1 8.9 - 10.3 mg/dL   Total Protein 7.0 6.5 - 8.1 g/dL   Albumin 3.9 3.5 - 5.0 g/dL   AST 31 15 - 41 U/L   ALT 20 17 - 63 U/L   Alkaline Phosphatase 101 38 - 126 U/L   Total Bilirubin 0.6 0.3 - 1.2 mg/dL   GFR calc non Af Amer 52 (L)  >60 mL/min   GFR calc Af Amer 60 (L) >60 mL/min    Comment: (NOTE) The eGFR has been calculated using the CKD EPI equation. This calculation has not been validated in all clinical situations. eGFR's persistently <60 mL/min signify possible Chronic Kidney Disease.    Anion gap 7 5 - 15  Lipase, blood     Status: None   Collection Time: 04/17/17 10:11 PM  Result Value Ref Range   Lipase 29 11 - 51 U/L  Urinalysis, Routine w reflex microscopic     Status: None   Collection Time: 04/17/17 10:11 PM  Result Value Ref Range   Color, Urine YELLOW YELLOW   APPearance CLEAR CLEAR   Specific Gravity, Urine 1.013 1.005 - 1.030   pH 7.0 5.0 - 8.0   Glucose, UA NEGATIVE NEGATIVE mg/dL   Hgb urine dipstick NEGATIVE NEGATIVE   Bilirubin Urine NEGATIVE NEGATIVE   Ketones, ur NEGATIVE NEGATIVE mg/dL   Protein, ur NEGATIVE NEGATIVE mg/dL   Nitrite NEGATIVE NEGATIVE   Leukocytes, UA NEGATIVE NEGATIVE  Urine culture     Status: None   Collection Time: 04/17/17 10:11 PM  Result Value Ref Range   Specimen Description URINE, CLEAN CATCH    Special Requests NONE    Culture      NO GROWTH Performed at [facility], [address] [place], [place] [address]    Report Status 04/19/2017 FINAL   Magnesium     Status: None   Collection Time: 04/17/17 10:11 PM  Result Value Ref Range   Magnesium 2.2 1.7 - 2.4 mg/dL  I-Stat CG4 Lactic Acid, ED     Status: Abnormal   Collection Time: 04/17/17 10:24 PM  Result Value Ref Range   Lactic Acid, Venous 2.60 (HH) 0.5 - 1.9 mmol/L   Comment NOTIFIED PHYSICIAN   CK     Status:  None   Collection Time: 04/17/17 10:24 PM  Result Value Ref Range   Total CK 121 49 - 397 U/L  I-Stat CG4 Lactic Acid, ED     Status: None   Collection Time: 04/18/17 12:11 AM  Result Value Ref Range   Lactic Acid, Venous 1.04 0.5 - 1.9 mmol/L  Ammonia     Status: Abnormal   Collection Time: 04/18/17  2:25 AM  Result Value Ref Range   Ammonia 41 (H) 9 - 35 umol/L  Hepatic  function panel     Status: None   Collection Time: 04/18/17  7:45 AM  Result Value Ref Range   Total Protein 7.4 6.5 - 8.1 g/dL   Albumin 4.2 3.5 - 5.0 g/dL   AST 39 15 - 41 U/L   ALT 21 17 - 63 U/L   Alkaline Phosphatase 113 38 - 126 U/L   Total Bilirubin 1.0 0.3 - 1.2 mg/dL   Bilirubin, Direct 0.1 0.1 - 0.5 mg/dL   Indirect Bilirubin 0.9 0.3 - 0.9 mg/dL  Basic metabolic panel     Status: Abnormal   Collection Time: 04/18/17  7:45 AM  Result Value Ref Range   Sodium 143 135 - 145 mmol/L   Potassium 3.8 3.5 - 5.1 mmol/L   Chloride 115 (H) 101 - 111 mmol/L   CO2 18 (L) 22 - 32 mmol/L   Glucose, Bld 83 65 - 99 mg/dL   BUN 26 (H) 6 - 20 mg/dL   Creatinine, Ser 1.48 (H) 0.61 - 1.24 mg/dL   Calcium 9.2 8.9 - 10.3 mg/dL   GFR calc non Af Amer 54 (L) >60 mL/min   GFR calc Af Amer >60 >60 mL/min    Comment: (NOTE) The eGFR has been calculated using the CKD EPI equation. This calculation has not been validated in all clinical situations. eGFR's persistently <60 mL/min signify possible Chronic Kidney Disease.    Anion gap 10 5 - 15  CBC WITH DIFFERENTIAL     Status: Abnormal   Collection Time: 04/18/17  7:45 AM  Result Value Ref Range   WBC 8.1 4.0 - 10.5 K/uL    Comment: CORRECTED ON 12/24 AT 0816: PREVIOUSLY REPORTED AS 8.3   RBC 3.27 (L) 4.22 - 5.81 MIL/uL    Comment: CORRECTED ON 12/24 AT 0816: PREVIOUSLY REPORTED AS 3.30   Hemoglobin 11.1 (L) 13.0 - 17.0 g/dL    Comment: CORRECTED ON 12/24 AT 0816: PREVIOUSLY REPORTED AS 11.0   HCT 33.4 (L) 39.0 - 52.0 %    Comment: CORRECTED ON 12/24 AT 0816: PREVIOUSLY REPORTED AS 33.7   MCV 102.1 (H) 78.0 - 100.0 fL   MCH 33.9 26.0 - 34.0 pg    Comment: CORRECTED ON 12/24 AT 0816: PREVIOUSLY REPORTED AS 33.3   MCHC 33.2 30.0 - 36.0 g/dL    Comment: CORRECTED ON 12/24 AT 0816: PREVIOUSLY REPORTED AS 32.6   RDW 13.5 11.5 - 15.5 %   Platelets 109 (L) 150 - 400 K/uL    Comment: REPEATED TO VERIFY SPECIMEN CHECKED FOR CLOTS PLATELET COUNT  CONFIRMED BY SMEAR    Neutrophils Relative % 68 %   Neutro Abs 5.5 1.7 - 7.7 K/uL    Comment: CORRECTED ON 12/24 AT 0816: PREVIOUSLY REPORTED AS 5.6   Lymphocytes Relative 19 %   Lymphs Abs 1.5 0.7 - 4.0 K/uL    Comment: CORRECTED ON 12/24 AT 0816: PREVIOUSLY REPORTED AS 1.6   Monocytes Relative 12 %   Monocytes Absolute 1.0 0.1 -  1.0 K/uL   Eosinophils Relative 1 %   Eosinophils Absolute 0.1 0.0 - 0.7 K/uL   Basophils Relative 0 %   Basophils Absolute 0.0 0.0 - 0.1 K/uL  Reticulocytes     Status: Abnormal   Collection Time: 04/18/17  7:45 AM  Result Value Ref Range   Retic Ct Pct 0.7 0.4 - 3.1 %    Comment: CORRECTED ON 12/24 AT 0816: PREVIOUSLY REPORTED AS 0.8   RBC. 3.27 (L) 4.22 - 5.81 MIL/uL    Comment: CORRECTED ON 12/24 AT 0816: PREVIOUSLY REPORTED AS 3.30   Retic Count, Absolute 22.9 19.0 - 186.0 K/uL    Comment: CORRECTED ON 12/24 AT 5573: PREVIOUSLY REPORTED AS 26.4  Vitamin B12     Status: None   Collection Time: 04/18/17  8:48 AM  Result Value Ref Range   Vitamin B-12 326 180 - 914 pg/mL    Comment: (NOTE) This assay is not validated for testing neonatal or myeloproliferative syndrome specimens for Vitamin B12 levels. Performed at [facility], [place] [address], [place], [place] [address]   Folate     Status: None   Collection Time: 04/18/17  8:48 AM  Result Value Ref Range   Folate 15.5 >5.9 ng/mL    Comment: Performed at [facility] [address], [place], [place] [address]  Iron and TIBC     Status: Abnormal   Collection Time: 04/18/17  8:48 AM  Result Value Ref Range   Iron 113 45 - 182 ug/dL   TIBC 258 250 - 450 ug/dL   Saturation Ratios 44 (H) 17.9 - 39.5 %   UIBC 145 ug/dL    Comment: Performed at [facility], [place] [address], [place], [place] [address]  Ferritin     Status: None   Collection Time: 04/18/17  8:48 AM  Result Value Ref Range   Ferritin 108 24 - 336 ng/mL    Comment: Performed at [facility] [address] [place], [place] [address]  CBC with Differential/Platelet     Status: Abnormal   Collection Time: 04/19/17  5:34 AM  Result Value Ref Range   WBC 11.5 (H) 4.0 - 10.5 K/uL   RBC 3.20 (L) 4.22 - 5.81 MIL/uL   Hemoglobin 10.6 (L) 13.0 - 17.0 g/dL   HCT 32.6 (L) 39.0 - 52.0 %   MCV 101.9 (H) 78.0 - 100.0 fL   MCH 33.1 26.0 - 34.0 pg   MCHC 32.5 30.0 - 36.0 g/dL   RDW 13.3 11.5 - 15.5 %   Platelets 103 (L) 150 - 400 K/uL    Comment: CONSISTENT WITH PREVIOUS RESULT   Neutrophils Relative % 79 %   Lymphocytes Relative 11 %   Monocytes Relative 10 %   Eosinophils Relative 0 %   Basophils Relative 0 %   Neutro Abs 9.0 (H) 1.7 - 7.7 K/uL   Lymphs Abs 1.3 0.7 - 4.0 K/uL   Monocytes Absolute 1.2 (H) 0.1 - 1.0 K/uL   Eosinophils Absolute 0.0 0.0 - 0.7 K/uL   Basophils Absolute 0.0 0.0 - 0.1 K/uL   Smear Review MORPHOLOGY UNREMARKABLE   Comprehensive metabolic panel     Status: Abnormal   Collection Time: 04/19/17  5:34 AM  Result Value Ref Range   Sodium 145 135 - 145 mmol/L   Potassium 4.0 3.5 - 5.1 mmol/L   Chloride 116 (H) 101 - 111 mmol/L   CO2 24 22 - 32 mmol/L  Glucose, Bld 120 (H) 65 - 99 mg/dL   BUN 36 (H) 6 - 20 mg/dL   Creatinine, Ser 1.43 (H) 0.61 - 1.24 mg/dL   Calcium 9.4 8.9 - 10.3 mg/dL   Total Protein 7.2 6.5 - 8.1 g/dL   Albumin 4.0 3.5 - 5.0 g/dL   AST 35 15 - 41 U/L   ALT 24 17 - 63 U/L   Alkaline Phosphatase 121 38 - 126 U/L   Total Bilirubin 1.0 0.3 - 1.2 mg/dL   GFR calc non Af Amer 57 (L) >60 mL/min   GFR calc Af Amer >60 >60 mL/min    Comment: (NOTE) The eGFR has been calculated using the CKD EPI equation. This calculation has not been validated in all clinical situations. eGFR's persistently <60 mL/min signify possible Chronic Kidney Disease.    Anion gap 5 5 - 15  Magnesium     Status: None   Collection Time: 04/19/17  5:34 AM  Result Value Ref Range   Magnesium 2.3 1.7 - 2.4 mg/dL    Current Facility-Administered Medications  Medication Dose  Route Frequency Provider Last Rate Last Dose  . acetaminophen (TYLENOL) tablet 650 mg  650 mg Oral Q6H PRN Rise Patience, MD       Or  . acetaminophen (TYLENOL) suppository 650 mg  650 mg Rectal Q6H PRN Rise Patience, MD      . amantadine (SYMMETREL) capsule 100 mg  100 mg Oral BID Rise Patience, MD   100 mg at 04/19/17 0851  . bromocriptine (PARLODEL) tablet 5 mg  5 mg Oral BID Rise Patience, MD   5 mg at 04/19/17 0850  . clonazePAM (KLONOPIN) tablet 1 mg  1 mg Oral QHS Rise Patience, MD   1 mg at 04/18/17 2109  . enoxaparin (LOVENOX) injection 40 mg  40 mg Subcutaneous Q24H Rise Patience, MD   40 mg at 04/18/17 0946  . escitalopram (LEXAPRO) tablet 10 mg  10 mg Oral Daily Rise Patience, MD   10 mg at 04/19/17 0854  . feeding supplement (ENSURE ENLIVE) (ENSURE ENLIVE) liquid 237 mL  237 mL Oral TID BM Rise Patience, MD   237 mL at 04/19/17 0852  . lacosamide (VIMPAT) tablet 50 mg  50 mg Oral BID Rise Patience, MD   50 mg at 04/19/17 9244  . LORazepam (ATIVAN) injection 0.5 mg  0.5 mg Intravenous Once Alekh, Kshitiz, MD      . ondansetron (ZOFRAN) tablet 4 mg  4 mg Oral Q6H PRN Rise Patience, MD       Or  . ondansetron [facility]) injection 4 mg  4 mg Intravenous Q6H PRN Rise Patience, MD      . Oxcarbazepine (TRILEPTAL) tablet 600 mg  600 mg Oral BID Rise Patience, MD   600 mg at 04/19/17 0848  . pantoprazole (PROTONIX) EC tablet 40 mg  40 mg Oral Daily Rise Patience, MD   40 mg at 04/19/17 6286  . polyethylene glycol (MIRALAX / GLYCOLAX) packet 17 g  17 g Oral Daily PRN Rise Patience, MD      . QUEtiapine (SEROQUEL) tablet 100 mg  100 mg Oral 2 times per day Rise Patience, MD   100 mg at 04/19/17 0900  . QUEtiapine (SEROQUEL) tablet 200 mg  200 mg Oral QHS Berton Mount, RPH   200 mg at 04/18/17 2109  . topiramate (TOPAMAX) tablet 100 mg  100 mg  Oral BID Rise Patience, MD   100 mg at  04/19/17 6004    Musculoskeletal: Strength & Muscle Tone: increased Gait & Station: Lying on his bed Patient leans: N/A  Psychiatric Specialty Exam: Physical Exam  Review of Systems  Neurological: Positive for tremors.  Psychiatric/Behavioral: Positive for memory loss.    Blood pressure (!) 118/51, pulse (!) 109, temperature 98.7 F (37.1 C), temperature source Axillary, resp. rate (!) 22, height 5' 10"  (1.778 m), weight 47.6 kg (105 lb), SpO2 95 %.Body mass index is 15.07 kg/m.  General Appearance: In hospital PJs  Eye Contact:  Fair  Speech:  A aphasia  Volume:  Decreased  Mood:  Anxious  Affect:  Restricted  Thought Process:  Descriptions of Associations: Loose  Orientation:  Other:  Alert and oriented to self  Thought Content:  Denies suicidal thoughts or paranoia by head nodding  Suicidal Thoughts:  No  Homicidal Thoughts:  No  Memory:  Immediate;   Fair Recent;   Poor Remote;   Poor  Judgement:  Fair  Insight:  Fair  Psychomotor Activity:  Increased, Restlessness and Tremor  Concentration:  Concentration: Fair and Attention Span: Poor  Recall:  Poor  Fund of Knowledge:  Poor  Language:  Poor  Akathisia:  Tremors in both extremities  Handed:  Right  AIMS (if indicated):     Assets:  Desire for Improvement Social Support  ADL's:  Impaired  Cognition:  Impaired,  Moderate  Sleep:        Treatment Plan Summary: Medication management Patient is on Seroquel and Lexapro however it is unclear why it was given.  As per chart he was given for anxiety and agitation.  He is taking these medication for more than 3 years.  Unlikely causing serotonin syndrome however consider discontinue Seroquel dose since patient is no more agitated at this time.  Consider Depakote to help with mood lability if needed.  Consider reducing Lexapro 5 mg to see if tremors improve.  Consultation liaison services will follow him. Disposition: No evidence of imminent risk to self or others at  present.   Patient does not meet criteria for psychiatric inpatient admission. Supportive therapy provided about ongoing stressors. Discussed crisis plan, support from social network, calling 911, coming to the Emergency Department, and calling Suicide Hotline.  Kathlee Nations, MD 04/19/2017 10:27 AM

## 2017-04-19 NOTE — Progress Notes (Signed)
[name], male   DOB: 1970/05/26, 46 y.o.   MRN: [id number]  PROGRESS NOTE    Hector [name]  WUJ:[id number] DOB: 1970/05/26 DOA: 04/17/2017 PCP: Hector [name], [name], MD   Brief Narrative:  46 year old male with history of traumatic brain injury, seizure disorder, question of alcohol liver cirrhosis presented with worsening tremors. Neurology was consulted.  Assessment & Plan:   Principal Problem:   Coarse tremors Active Problems:   Alcoholic cirrhosis of liver without ascites (HCC)   Seizure (HCC)   ARF (acute renal failure) (HCC)    1. Worsening Tremors - questionable cause: Myoclonic versus side effect of various medications including antipsychotic medications. Neurology following. MRI pending as per neurology recommendations. EEG did not show any active seizures.   Linzess discontinued as per neurology recommendations. Patient continues to have tremors. Psychiatry consultation pending as per neurology recommendations. 2. Question of Liver cirrhosis -ultrasound of abdomen is pending. It is unclear if the patient really has cirrhosis as prior imaging studies don't mention that. Patient is not on any diuretics 3. Acute on chronic kidney disease stage III  -monitor creatinine. Creatinine is probably at baseline  4. Seizure disorder on Vimpat Topamax and Trileptal.   neurology following 5. Macrocytic anemia -hemoglobin stable 6. Thrombocytopenia-questionable cause. Stable 7. History of depression- continue home medications. Psychiatry evaluation pending   DVT prophylaxis: Lovenox Code Status:  Full Family Communication:  Spoke to patient's father Disposition Plan: Depends on clinical outcome  Consultants: Neurology and psychiatry  Procedures: EEG on 04/18/2017 was unremarkable  Antimicrobials: None Subjective: Patient seen and examined at bedside. He is awake but doesn't send answer any questions. He continues to have whole body tremors. No overnight fever  or vomiting  Objective: Vitals:   04/18/17 0846 04/18/17 1920 04/19/17 0418 04/19/17 0626  BP: (!) 143/106 (!) 132/94 (!) 154/117 (!) 118/51  Pulse: 88 79 (!) 104 (!) 109  Resp: 20 19 18  (!) 22  Temp: 97.6 F (36.4 C) 98.1 F (36.7 C) 98.6 F (37 C) 98.7 F (37.1 C)  TempSrc: Oral Oral Oral Axillary  SpO2: 94% 96% 94% 95%  Weight:      Height:        Intake/Output Summary (Last 24 hours) at 04/19/2017 1009 Last data filed at 04/19/2017 0818 Gross per 24 hour  Intake 860 ml  Output -  Net 860 ml   Filed Weights   04/17/17 2054  Weight: 47.6 kg (105 lb)    Examination:  General exam: Appears older than stated age. Awake with continuous whole body tremors Respiratory system: Bilateral decreased breath sound at bases Cardiovascular system: S1 & S2 heard, tachycardic  Gastrointestinal system: Abdomen is nondistended, soft and nontender. Normal bowel sounds heard. Extremities: No cyanosis, clubbing, edema   Data Reviewed: I have personally reviewed following labs and imaging studies  CBC: Recent Labs  Lab 04/17/17 2211 04/18/17 0745 04/19/17 0534  WBC 9.3 8.1 11.5*  NEUTROABS 7.3 5.5 9.0*  HGB 11.0* 11.1* 10.6*  HCT 33.8* 33.4* 32.6*  MCV 101.5* 102.1* 101.9*  PLT 113* 109* 103*   Basic Metabolic Panel: Recent Labs  Lab 04/17/17 2211 04/18/17 0745 04/19/17 0534  NA 144 143 145  K 4.1 3.8 4.0  CL 115* 115* 116*  CO2 22 18* 24  GLUCOSE 104* 83 120*  BUN 29* 26* 36*  CREATININE 1.54* 1.48* 1.43*  CALCIUM 9.1 9.2 9.4  MG 2.2  --  2.3   GFR: Estimated Creatinine Clearance: 42.5 mL/min (  A) (by C-G formula based on SCr of 1.43 mg/dL (H)). Liver Function Tests: Recent Labs  Lab 04/17/17 2211 04/18/17 0745 04/19/17 0534  AST 31 39 35  ALT 20 21 24   ALKPHOS 101 113 121  BILITOT 0.6 1.0 1.0  PROT 7.0 7.4 7.2  ALBUMIN 3.9 4.2 4.0   Recent Labs  Lab 04/17/17 2211  LIPASE 29   Recent Labs  Lab 04/18/17 0225  AMMONIA 41*   Coagulation  Profile: No results for input(s): INR, PROTIME in the last 168 hours. Cardiac Enzymes: Recent Labs  Lab 04/17/17 2224  CKTOTAL 121   BNP (last 3 results) No results for input(s): PROBNP in the last 8760 hours. HbA1C: No results for input(s): HGBA1C in the last 72 hours. CBG: Recent Labs  Lab 04/17/17 2133  GLUCAP 104*   Lipid Profile: No results for input(s): CHOL, HDL, LDLCALC, TRIG, CHOLHDL, LDLDIRECT in the last 72 hours. Thyroid Function Tests: No results for input(s): TSH, T4TOTAL, FREET4, T3FREE, THYROIDAB in the last 72 hours. Anemia Panel: Recent Labs    04/18/17 0745 04/18/17 0848  VITAMINB12  --  326  FOLATE  --  15.5  FERRITIN  --  108  TIBC  --  258  IRON  --  113  RETICCTPCT 0.7  --    Sepsis Labs: Recent Labs  Lab 04/17/17 2224 04/18/17 0011  LATICACIDVEN 2.60* 1.04    Recent Results (from the past 240 hour(s))  Urine culture     Status: None   Collection Time: 04/17/17 10:11 PM  Result Value Ref Range Status   Specimen Description URINE, CLEAN CATCH  Final   Special Requests NONE  Final   Culture   Final    NO GROWTH Performed at [facility], [address] [place], [place] [address]    Report Status 04/19/2017 FINAL  Final         Radiology Studies: Ct Head Wo Contrast  Result Date: 04/17/2017 CLINICAL DATA:  Acute onset of tremors and stuttering. EXAM: CT HEAD WITHOUT CONTRAST TECHNIQUE: Contiguous axial images were obtained from the base of the skull through the vertex without intravenous contrast. COMPARISON:  CT of the head performed 02/25/2017 FINDINGS: Brain: No evidence of acute infarction, hemorrhage, hydrocephalus, extra-axial collection or mass lesion/mass effect. There is chronic ex vacuo dilatation of the left lateral ventricle, reflecting remote infarct. Underlying encephalomalacia is noted at the left parietal and temporal lobes. Small chronic infarcts are noted at the inferior frontal lobes bilaterally. The  brainstem and fourth ventricle are within normal limits. No mass effect or midline shift is seen. Vascular: No hyperdense vessel or unexpected calcification. Skull: There is no evidence of fracture; a left parietal craniotomy flap is noted. Sinuses/Orbits: The orbits are within normal limits. Inspissated mucus is noted at the right maxillary sinus. The remaining paranasal sinuses and mastoid air cells are well-aerated. Other: No significant soft tissue abnormalities are seen. IMPRESSION: 1. No acute intracranial pathology seen on CT. 2. 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 Underlying encephalomalacia at the left parietal and temporal lobes. Overlying postoperative change noted. 3. 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 Electronically Signed   By: Roanna RaiderJeffery  Chang M.D.   On: 04/17/2017 23:25   Dg Chest Portable 1 View  Result Date: 04/17/2017 CLINICAL DATA:  Tremors and possible infection EXAM: PORTABLE CHEST 1 VIEW COMPARISON:  Chest radiograph 02/25/2017 FINDINGS: The heart size and mediastinal contours are within normal limits.  Both lungs are clear. The visualized skeletal structures are unremarkable. IMPRESSION: No active disease. Electronically Signed   By: Deatra Robinson M.D.   On: 04/17/2017 22:26        Scheduled Meds: . amantadine  100 mg Oral BID  . bromocriptine  5 mg Oral BID  . clonazePAM  1 mg Oral QHS  . enoxaparin (LOVENOX) injection  40 mg Subcutaneous Q24H  . escitalopram  10 mg Oral Daily  . feeding supplement (ENSURE ENLIVE)  237 mL Oral TID BM  . lacosamide  50 mg Oral BID  . oxcarbazepine  600 mg Oral BID  . pantoprazole  40 mg Oral Daily  . QUEtiapine  100 mg Oral 2 times per day  . QUEtiapine  200 mg Oral QHS  . topiramate  100 mg Oral BID   Continuous Infusions:   LOS: 1 day        Glade Lloyd, MD [facility] Pager [phone]  If 7PM-7AM, please contact  night-coverage [email or web address] Password [email or web address] 04/19/2017, 10:09 AM

## 2017-04-20 ENCOUNTER — Encounter (HOSPITAL_COMMUNITY)
Admission: EM | Disposition: A | Discharge status: Home Health | Payer: Self-pay | Source: Home / Self Care | Attending: Internal Medicine

## 2017-04-20 ENCOUNTER — Encounter (HOSPITAL_COMMUNITY): Payer: Self-pay | Admitting: Certified Registered Nurse Anesthetist

## 2017-04-20 ENCOUNTER — Inpatient Hospital Stay (HOSPITAL_COMMUNITY): Payer: Medicaid Other | Admitting: Certified Registered Nurse Anesthetist

## 2017-04-20 ENCOUNTER — Inpatient Hospital Stay (HOSPITAL_COMMUNITY): Payer: Medicaid Other

## 2017-04-20 HISTORY — PX: RADIOLOGY WITH ANESTHESIA: SHX6223

## 2017-04-20 LAB — CBC WITH DIFFERENTIAL/PLATELET
BASOS PCT: 0 %
Basophils Absolute: 0 10*3/uL (ref 0.0–0.1)
EOS PCT: 0 %
Eosinophils Absolute: 0 10*3/uL (ref 0.0–0.7)
HEMATOCRIT: 33.1 % — AB (ref 39.0–52.0)
Hemoglobin: 10.7 g/dL — ABNORMAL LOW (ref 13.0–17.0)
LYMPHS ABS: 1.2 10*3/uL (ref 0.7–4.0)
Lymphocytes Relative: 9 %
MCH: 32.8 pg (ref 26.0–34.0)
MCHC: 32.3 g/dL (ref 30.0–36.0)
MCV: 101.5 fL — AB (ref 78.0–100.0)
MONOS PCT: 10 %
Monocytes Absolute: 1.4 10*3/uL — ABNORMAL HIGH (ref 0.1–1.0)
NEUTROS ABS: 11 10*3/uL — AB (ref 1.7–7.7)
Neutrophils Relative %: 81 %
Platelets: 113 10*3/uL — ABNORMAL LOW (ref 150–400)
RBC: 3.26 MIL/uL — AB (ref 4.22–5.81)
RDW: 13.4 % (ref 11.5–15.5)
WBC: 13.6 10*3/uL — ABNORMAL HIGH (ref 4.0–10.5)

## 2017-04-20 LAB — COMPREHENSIVE METABOLIC PANEL
ALT: 38 U/L (ref 17–63)
AST: 58 U/L — AB (ref 15–41)
Albumin: 4.1 g/dL (ref 3.5–5.0)
Alkaline Phosphatase: 127 U/L — ABNORMAL HIGH (ref 38–126)
Anion gap: 8 (ref 5–15)
BILIRUBIN TOTAL: 0.7 mg/dL (ref 0.3–1.2)
BUN: 42 mg/dL — AB (ref 6–20)
CO2: 23 mmol/L (ref 22–32)
Calcium: 9.6 mg/dL (ref 8.9–10.3)
Chloride: 121 mmol/L — ABNORMAL HIGH (ref 101–111)
Creatinine, Ser: 1.61 mg/dL — ABNORMAL HIGH (ref 0.61–1.24)
GFR calc Af Amer: 57 mL/min — ABNORMAL LOW (ref >60)
GFR, EST NON AFRICAN AMERICAN: 49 mL/min — AB (ref >60)
Glucose, Bld: 116 mg/dL — ABNORMAL HIGH (ref 65–99)
POTASSIUM: 3.8 mmol/L (ref 3.5–5.1)
Sodium: 152 mmol/L — ABNORMAL HIGH (ref 135–145)
TOTAL PROTEIN: 7.7 g/dL (ref 6.5–8.1)

## 2017-04-20 LAB — MAGNESIUM: MAGNESIUM: 2.4 mg/dL (ref 1.7–2.4)

## 2017-04-20 SURGERY — MRI WITH ANESTHESIA
Anesthesia: General

## 2017-04-20 MED ORDER — ROCURONIUM BROMIDE 100 MG/10ML IV SOLN
INTRAVENOUS | Status: DC | PRN
  Administered 2017-04-20: 30 mg via INTRAVENOUS

## 2017-04-20 MED ORDER — DEXTROSE 5 % IV SOLN
INTRAVENOUS | Status: DC
  Administered 2017-04-20 – 2017-04-22 (×4): via INTRAVENOUS

## 2017-04-20 MED ORDER — FENTANYL CITRATE (PF) 100 MCG/2ML IJ SOLN
INTRAMUSCULAR | Status: AC
  Filled 2017-04-20: qty 2

## 2017-04-20 MED ORDER — LACTATED RINGERS IV SOLN
INTRAVENOUS | Status: DC
  Administered 2017-04-20 (×2): via INTRAVENOUS

## 2017-04-20 MED ORDER — PROPOFOL 10 MG/ML IV BOLUS
INTRAVENOUS | Status: DC | PRN
  Administered 2017-04-20: 120 mg via INTRAVENOUS

## 2017-04-20 MED ORDER — FENTANYL CITRATE (PF) 250 MCG/5ML IJ SOLN
INTRAMUSCULAR | Status: AC
  Filled 2017-04-20: qty 5

## 2017-04-20 MED ORDER — LIDOCAINE HCL (CARDIAC) 20 MG/ML IV SOLN
INTRAVENOUS | Status: DC | PRN
  Administered 2017-04-20: 60 mg via INTRAVENOUS

## 2017-04-20 MED ORDER — DEXAMETHASONE SODIUM PHOSPHATE 4 MG/ML IJ SOLN
INTRAMUSCULAR | Status: DC | PRN
  Administered 2017-04-20: 5 mg via INTRAVENOUS

## 2017-04-20 MED ORDER — MIDAZOLAM HCL 2 MG/2ML IJ SOLN
INTRAMUSCULAR | Status: AC
  Administered 2017-04-20: 1 mg via INTRAVENOUS
  Filled 2017-04-20: qty 2

## 2017-04-20 MED ORDER — SUGAMMADEX SODIUM 200 MG/2ML IV SOLN
INTRAVENOUS | Status: DC | PRN
  Administered 2017-04-20: 100 mg via INTRAVENOUS

## 2017-04-20 MED ORDER — FENTANYL CITRATE (PF) 100 MCG/2ML IJ SOLN
INTRAMUSCULAR | Status: DC | PRN
  Administered 2017-04-20: 50 ug via INTRAVENOUS

## 2017-04-20 MED ORDER — ONDANSETRON HCL 4 MG/2ML IJ SOLN
INTRAMUSCULAR | Status: DC | PRN
Start: 2017-04-20 — End: 2017-04-20
  Administered 2017-04-20: 4 mg via INTRAVENOUS

## 2017-04-20 MED ORDER — PROMETHAZINE HCL 25 MG/ML IJ SOLN: 6.2500 mg | INTRAMUSCULAR | Status: DC | PRN

## 2017-04-20 MED ORDER — FENTANYL CITRATE (PF) 100 MCG/2ML IJ SOLN
25.0000 ug | INTRAMUSCULAR | Status: DC | PRN
  Administered 2017-04-20: 25 ug via INTRAVENOUS

## 2017-04-20 MED ORDER — MIDAZOLAM HCL 2 MG/2ML IJ SOLN
INTRAMUSCULAR | Status: AC
  Filled 2017-04-20: qty 2

## 2017-04-20 MED ORDER — MIDAZOLAM HCL 2 MG/2ML IJ SOLN
1.0000 mg | INTRAMUSCULAR | Status: AC | PRN
  Administered 2017-04-20 (×2): 1 mg via INTRAVENOUS

## 2017-04-20 NOTE — Progress Notes (Signed)
Patient place in medical waist  restraint at 0200 secondary to pulling out iv, removal of iv, attempting to get out of bed

## 2017-04-20 NOTE — Progress Notes (Signed)
MD ordered MRI [facility] at [facility].  Consent was obtained.  [facility] was called.   Report given to [facility], Scientist, clinical (histocompatibility and immunogenetics)CRNA.  [facility] transferred [facility] to [facility] for procedure.

## 2017-04-20 NOTE — Transfer of Care (Addendum)
Immediate Anesthesia Transfer of Care Note  Patient: Hector [name]  Procedure(s) Performed: MRI WITH ANESTHESIA OF THE BRAIN (N/A )  Patient Location: PACU  Anesthesia Type:General  Level of Consciousness: awake, patient cooperative and confused  Airway & Oxygen Therapy: Patient Spontanous Breathing and Patient connected to face mask  Post-op Assessment: Report given to RN and Post -op Vital signs reviewed and stable  Post vital signs: Reviewed and stable  Last Vitals:  Vitals:   04/20/17 1944 04/20/17 1959  BP: (!) 164/93 (!) 184/171  Pulse: 92 91  Resp: (!) 33 (!) 33  Temp: 36.7 C   SpO2: 92% 90%    Last Pain:  Vitals:   04/20/17 1944  TempSrc:   PainSc: 0-No pain         Complications: No apparent anesthesia complications

## 2017-04-20 NOTE — Addendum Note (Signed)
Addendum  created 04/20/17 2207 by Tillman AbideHawkins, Kagan Mutchler B, CRNA   Visit diagnoses modified

## 2017-04-20 NOTE — Anesthesia Preprocedure Evaluation (Addendum)
Anesthesia Evaluation  Patient identified by MRN, date of birth, ID band Patient confused  General Assessment Comment:Hector [name] is a 46 y.o. 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 Everlena CooperJaffe [facility], who presented to the emergency room for evaluation of shaking episodes  Reviewed: Allergy & Precautions, NPO status , Patient's Chart, lab work & pertinent test results  Airway Mallampati: II  TM Distance: <3 FB Neck ROM: Limited    Dental no notable dental hx. (+) Dental Advisory Given   Pulmonary COPD, Current Smoker,    Pulmonary exam normal breath sounds clear to auscultation       Cardiovascular + DVT  Normal cardiovascular exam Rhythm:Regular Rate:Normal     Neuro/Psych Seizures - (with tremor), Poorly Controlled,  Depression TBI with SDH negative psych ROS   GI/Hepatic negative GI ROS, Neg liver ROS, GERD  ,  Endo/Other  negative endocrine ROS  Renal/GU Renal InsufficiencyRenal disease  negative genitourinary   Musculoskeletal negative musculoskeletal ROS (+)   Abdominal   Peds negative pediatric ROS (+)  Hematology negative hematology ROS (+) Hb 10.7, plt 113k   Anesthesia Other Findings   Reproductive/Obstetrics negative OB ROS                          Anesthesia Physical Anesthesia Plan  ASA: IV  Anesthesia Plan: General   Post-op Pain Management:    Induction: Intravenous  PONV Risk Score and Plan: 1 and Ondansetron  Airway Management Planned: Oral ETT and Video Laryngoscope Planned  Additional Equipment:   Intra-op Plan:   Post-operative Plan: Extubation in OR  Informed Consent: I have reviewed the patients History and Physical, chart, labs and discussed the procedure including the risks,  benefits and alternatives for the proposed anesthesia with the patient or authorized representative who has indicated his/her understanding and acceptance.   Dental advisory given  Plan Discussed with: CRNA and Surgeon  Anesthesia Plan Comments:       Anesthesia Quick Evaluation

## 2017-04-20 NOTE — Anesthesia Postprocedure Evaluation (Addendum)
Anesthesia Post Note  Patient: Hector [name]  Procedure(s) Performed: MRI WITH ANESTHESIA OF THE BRAIN (N/A )     Patient location during evaluation: PACU Anesthesia Type: General Post-procedure mental status: back to baseline mental status. Pain management: pain level controlled Vital Signs Assessment: post-procedure vital signs reviewed and stable Respiratory status: spontaneous breathing, nonlabored ventilation, respiratory function stable and patient connected to nasal cannula oxygen Cardiovascular status: blood pressure returned to baseline and stable Postop Assessment: no apparent nausea or vomiting Anesthetic complications: no    Last Vitals:  Vitals:   04/20/17 2015 04/20/17 2030  BP: 110/67   Pulse: 90 82  Resp: (!) 28 (!) 32  Temp:    SpO2: 96% 97%    Last Pain:  Vitals:   04/20/17 1944  TempSrc:   PainSc: 0-No pain                 Malessa Zartman,W. EDMOND

## 2017-04-20 NOTE — Progress Notes (Signed)
[name], male   DOB: 08/23/1970, 46 y.o.   MRN: [id number]  PROGRESS NOTE    Hector [name]  OZH:[id number] DOB: 06-11-1970 DOA: 04/17/2017 PCP: Ulyess Blossom, MD   Brief Narrative:  46 year old male with history of traumatic brain injury, seizure disorder, question of alcohol liver cirrhosis presented with worsening tremors. Neurology was consulted. Psychiatry was also consulted.  Assessment & Plan:   Principal Problem:   Coarse tremors Active Problems:   Alcoholic cirrhosis of liver without ascites (HCC)   Seizure (HCC)   ARF (acute renal failure) (HCC)    1. Worsening Tremors - questionable cause: Myoclonic versus side effect of various medications including antipsychotic medications. Neurology following. MRI pending as per neurology recommendations. As per the MRI department, patient will need to be transferred to [facility] for the MRI under sedation. Spoke to neurology/Dr. Wilford Corner about the MRI planned. We will try to arrange for patient to be transported to [facility] for MRI. EEG did not show any active seizures.   Linzess discontinued as per neurology recommendations. Patient continues to have tremors. Psychiatry consultation appreciated. Seroquel discontinued as per psychiatry recommendations and dose of Lexapro decreased as well as per their recommendations.  2. Question of Liver cirrhosis -doubt that the patient has cirrhosis of liver. ultrasound of abdomen did not show cirrhotic liver. Patient is not on any diuretics. Outpatient follow-up with primary care provider 3. Acute on chronic kidney disease stage III  -monitor creatinine. Creatinine is probably at baseline  4. Seizure disorder on Vimpat Topamax and Trileptal.   neurology following 5. Macrocytic anemia -hemoglobin stable 6. Thrombocytopenia-questionable cause. Stable 7. Leukocytosis-probably reactive. Repeat a.m. labs 8. History of depression- medications changes as per psychiatry  recommendations. Outpatient follow-up with psychiatry    DVT prophylaxis: Lovenox Code Status:  Full Family Communication:  Spoke to patient's father Disposition Plan: Depends on clinical outcome  Consultants: Neurology and psychiatry  Procedures: EEG on 04/18/2017 was unremarkable  Antimicrobials: None Subjective: Patient seen and examined at bedside. He is awake but doesn't send answer any questions. He continues to have whole body tremors. No overnight fever or vomiting  Objective: Vitals:   04/19/17 0626 04/19/17 1355 04/19/17 2053 04/20/17 0309  BP: (!) 118/51 111/64 109/60 96/64  Pulse: (!) 109 98 95 100  Resp: (!) 22 19 20 18   Temp: 98.7 F (37.1 C) 98.3 F (36.8 C) 97.9 F (36.6 C) 98.6 F (37 C)  TempSrc: Axillary Axillary Oral Axillary  SpO2: 95% 97% 94% 94%  Weight:    45.1 kg (99 lb 6.8 oz)  Height:        Intake/Output Summary (Last 24 hours) at 04/20/2017 1042 Last data filed at 04/20/2017 1033 Gross per 24 hour  Intake 1200 ml  Output 700 ml  Net 500 ml   Filed Weights   04/17/17 2054 04/20/17 0309  Weight: 47.6 kg (105 lb) 45.1 kg (99 lb 6.8 oz)    Examination:  General exam:  Awake with continuous whole body tremors Respiratory system: Bilateral decreased breath sound at bases Cardiovascular system: S1 & S2 heard, rate controlled  Gastrointestinal system: Abdomen is nondistended, soft and nontender. Normal bowel sounds heard. Extremities: No cyanosis, clubbing, edema   Data Reviewed: I have personally reviewed following labs and imaging studies  CBC: Recent Labs  Lab 04/17/17 2211 04/18/17 0745 04/19/17 0534 04/20/17 0441  WBC 9.3 8.1 11.5* 13.6*  NEUTROABS 7.3 5.5 9.0* 11.0*  HGB 11.0* 11.1* 10.6* 10.7*  HCT 33.8* 33.4* 32.6* 33.1*  MCV 101.5* 102.1* 101.9* 101.5*  PLT 113* 109* 103* 113*   Basic Metabolic Panel: Recent Labs  Lab 04/17/17 2211 04/18/17 0745 04/19/17 0534 04/20/17 0441  NA 144 143 145 152*  K 4.1 3.8 4.0  3.8  CL 115* 115* 116* 121*  CO2 22 18* 24 23  GLUCOSE 104* 83 120* 116*  BUN 29* 26* 36* 42*  CREATININE 1.54* 1.48* 1.43* 1.61*  CALCIUM 9.1 9.2 9.4 9.6  MG 2.2  --  2.3 2.4   GFR: Estimated Creatinine Clearance: 35.8 mL/min (A) (by C-G formula based on SCr of 1.61 mg/dL (H)). Liver Function Tests: Recent Labs  Lab 04/17/17 2211 04/18/17 0745 04/19/17 0534 04/20/17 0441  AST 31 39 35 58*  ALT 20 21 24  38  ALKPHOS 101 113 121 127*  BILITOT 0.6 1.0 1.0 0.7  PROT 7.0 7.4 7.2 7.7  ALBUMIN 3.9 4.2 4.0 4.1   Recent Labs  Lab 04/17/17 2211  LIPASE 29   Recent Labs  Lab 04/18/17 0225  AMMONIA 41*   Coagulation Profile: No results for input(s): INR, PROTIME in the last 168 hours. Cardiac Enzymes: Recent Labs  Lab 04/17/17 2224  CKTOTAL 121   BNP (last 3 results) No results for input(s): PROBNP in the last 8760 hours. HbA1C: No results for input(s): HGBA1C in the last 72 hours. CBG: Recent Labs  Lab 04/17/17 2133  GLUCAP 104*   Lipid Profile: No results for input(s): CHOL, HDL, LDLCALC, TRIG, CHOLHDL, LDLDIRECT in the last 72 hours. Thyroid Function Tests: No results for input(s): TSH, T4TOTAL, FREET4, T3FREE, THYROIDAB in the last 72 hours. Anemia Panel: Recent Labs    04/18/17 0745 04/18/17 0848  VITAMINB12  --  326  FOLATE  --  15.5  FERRITIN  --  108  TIBC  --  258  IRON  --  113  RETICCTPCT 0.7  --    Sepsis Labs: Recent Labs  Lab 04/17/17 2224 04/18/17 0011  LATICACIDVEN 2.60* 1.04    Recent Results (from the past 240 hour(s))  Urine culture     Status: None   Collection Time: 04/17/17 10:11 PM  Result Value Ref Range Status   Specimen Description URINE, CLEAN CATCH  Final   Special Requests NONE  Final   Culture   Final    NO GROWTH Performed at [facility], [address] [place], [place] [address]    Report Status 04/19/2017 FINAL  Final         Radiology Studies: Kore[place] Abdomen Complete  Result Date:  04/19/2017 CLINICAL DATA:  Abdominal pain and elevated ammonia. EXAM: ABDOMEN ULTRASOUND COMPLETE COMPARISON:  None. FINDINGS: Gallbladder: Status post cholecystectomy.  Surgically absent. Common bile duct: Diameter: 4 mm. Liver: The liver demonstrates coarse echotexture and mildly increased echogenicity, likely reflecting steatosis. No overt cirrhotic contour abnormalities or focal lesions are identified. There is no evidence of intrahepatic biliary ductal dilatation. Portal vein is patent on color Doppler imaging with normal direction of blood flow towards the liver. IVC: No abnormality visualized. Pancreas: Visualized portion unremarkable. Spleen: Size and appearance within normal limits. Right Kidney: Length: 9.1 cm. Echogenicity within normal limits. No mass or hydronephrosis visualized. Left Kidney: Length: 10.7 cm. Echogenicity within normal limits. No mass or hydronephrosis visualized. Abdominal aorta: No aneurysm visualized. Other findings: None. IMPRESSION: Coarse echotexture and mildly increased echogenicity of the liver likely reflects steatosis. There is no evidence of overt cirrhosis, hepatic mass or biliary obstruction. Electronically Signed  By: Irish LackGlenn  Yamagata M.D.   On: 04/19/2017 16:43        Scheduled Meds: . amantadine  100 mg Oral BID  . bromocriptine  5 mg Oral BID  . clonazePAM  1 mg Oral QHS  . enoxaparin (LOVENOX) injection  40 mg Subcutaneous Q24H  . escitalopram  5 mg Oral Daily  . feeding supplement (ENSURE ENLIVE)  237 mL Oral TID BM  . lacosamide  50 mg Oral BID  . LORazepam  0.5 mg Intravenous Once  . oxcarbazepine  600 mg Oral BID  . pantoprazole  40 mg Oral Daily  . topiramate  100 mg Oral BID   Continuous Infusions: . dextrose       LOS: 2 days        Glade LloydKshitiz Ismahan Lippman, MD [facility] Pager [phone]  If 7PM-7AM, please contact night-coverage [email or web address] Password [email or web address] 04/20/2017, 10:42 AM

## 2017-04-20 NOTE — Progress Notes (Signed)
    04/20/17 0300  What Happened  Was fall witnessed? No  Was patient injured? No  Patient found on floor  Found by Staff-comment Grayling Congress(T Hadlei Stitt RN)  Stated prior activity other (comment) (removed waist restraint, fell to floor)  Follow Up  MD notified Donnamarie PoagK Kirby NP  Time MD notified 0300  Progress note created (see row info) Yes

## 2017-04-21 ENCOUNTER — Encounter (HOSPITAL_COMMUNITY): Payer: Self-pay | Admitting: Radiology

## 2017-04-21 LAB — BASIC METABOLIC PANEL
Anion gap: 9 (ref 5–15)
BUN: 42 mg/dL — AB (ref 6–20)
CHLORIDE: 118 mmol/L — AB (ref 101–111)
CO2: 22 mmol/L (ref 22–32)
Calcium: 9.1 mg/dL (ref 8.9–10.3)
Creatinine, Ser: 1.57 mg/dL — ABNORMAL HIGH (ref 0.61–1.24)
GFR calc Af Amer: 59 mL/min — ABNORMAL LOW (ref >60)
GFR calc non Af Amer: 51 mL/min — ABNORMAL LOW (ref >60)
Glucose, Bld: 99 mg/dL (ref 65–99)
POTASSIUM: 3.4 mmol/L — AB (ref 3.5–5.1)
SODIUM: 149 mmol/L — AB (ref 135–145)

## 2017-04-21 LAB — CBC
HEMATOCRIT: 32.8 % — AB (ref 39.0–52.0)
HEMOGLOBIN: 10.7 g/dL — AB (ref 13.0–17.0)
MCH: 33.2 pg (ref 26.0–34.0)
MCHC: 32.6 g/dL (ref 30.0–36.0)
MCV: 101.9 fL — ABNORMAL HIGH (ref 78.0–100.0)
Platelets: 126 10*3/uL — ABNORMAL LOW (ref 150–400)
RBC: 3.22 MIL/uL — ABNORMAL LOW (ref 4.22–5.81)
RDW: 13.3 % (ref 11.5–15.5)
WBC: 9.3 10*3/uL (ref 4.0–10.5)

## 2017-04-21 LAB — MAGNESIUM: MAGNESIUM: 2.6 mg/dL — AB (ref 1.7–2.4)

## 2017-04-21 LAB — 10-HYDROXYCARBAZEPINE: Triliptal/MTB(Oxcarbazepin): 40 ug/mL — ABNORMAL HIGH (ref 10–35)

## 2017-04-21 MED ORDER — POTASSIUM CHLORIDE CRYS ER 20 MEQ PO TBCR
60.0000 meq | EXTENDED_RELEASE_TABLET | Freq: Once | ORAL | Status: AC
  Administered 2017-04-21: 60 meq via ORAL
  Filled 2017-04-21: qty 3

## 2017-04-21 NOTE — Progress Notes (Addendum)
Chart review progress note  MRI completed.  Reviewed.  No acute abnormalities. EEG also unremarkable.  Plan: Follow up with outpatient neurology. Treatment of toxic metabolic derangements per primary team as you are. Appreciate psychiatry input and medication adjustments.  Please call neurology with questions. Plan to relayed to the primary hospitalist.  -- Milon DikesAshish Janvi Ammar, MD [facility] [phone] If 7pm to 7am, please call on call as listed on AMION.

## 2017-04-21 NOTE — Progress Notes (Signed)
[name], male   DOB: Jan 25, 1971, 46 y.o.   MRN: [id number]  PROGRESS NOTE    Hector [name]  [id number] DOB: Jan 25, 1971 DOA: 04/17/2017 PCP: [name], [name], MD   Brief Narrative:  46 year old male with history of traumatic brain injury, seizure disorder, question of alcohol liver cirrhosis presented with worsening tremors. Neurology was consulted. Psychiatry was also consulted.   Assessment & Plan:   Principal Problem:   Coarse tremors Active Problems:   Alcoholic cirrhosis of liver without ascites (HCC)   Seizure (HCC)   ARF (acute renal failure) (HCC)    1. Worsening Tremors - questionable cause: Myoclonic versus side effect of various medications including antipsychotic medications. Neurology following. MRI done yesterday showed no acute abnormality. EEG did not show any active seizures.  Spoke to call neurologist/Dr. Wilford CornerArora on phone today who  recommended outpatient follow-up with the primary neurologist.   Linzess discontinued as per neurology recommendations. Patient continues to have tremors, may be slightly better. Psychiatry consultation appreciated. Seroquel discontinued as per psychiatry recommendations and dose of Lexapro decreased as well as per their recommendations.  2. Question of Liver cirrhosis -doubt that the patient has cirrhosis of liver. ultrasound of abdomen did not show cirrhotic liver. Patient is not on any diuretics. Outpatient follow-up with primary care provider 3. Acute on chronic kidney disease stage III  -monitor creatinine. Creatinine is probably at baseline  4. Seizure disorder on Vimpat Topamax and Trileptal.  Outpatient follow-up with neurology 5. Macrocytic anemia -hemoglobin stable 6. Thrombocytopenia-questionable cause. Stable 7. Leukocytosis-probably reactive.  Resolved 8. History of depression- medications changes as per psychiatry recommendations. Outpatient follow-up with psychiatry  9. Hypokalemia- Replace.  Repeat  a.m. Labs 10. Poor oral intake-continue IV fluids for today.  Repeat a.m. labs   DVT prophylaxis: Lovenox Code Status:  Full Family Communication:  Spoke to patient's father on phone Disposition Plan: Probable discharge home tomorrow  Consultants: Neurology and psychiatry  Procedures: EEG on 04/18/2017 was unremarkable  Antimicrobials: None Subjective: Patient seen and examined at bedside. He is awake but doesn't answer any questions. He continues to have whole body tremors; but slightly better than yesterday. No overnight fever or vomiting  Objective: Vitals:   04/20/17 2045 04/20/17 2136 04/21/17 0511 04/21/17 1100  BP: 102/72 (!) 83/35 (!) 86/56 97/62  Pulse: 90 87 85 82  Resp: (!) 27 (!) 25    Temp: 99.1 F (37.3 C) 98 F (36.7 C) 97.6 F (36.4 C)   TempSrc:   Oral   SpO2: 92%  91%   Weight:   47.9 kg (105 lb 9.6 oz)   Height:        Intake/Output Summary (Last 24 hours) at 04/21/2017 1341 Last data filed at 04/21/2017 1258 Gross per 24 hour  Intake 1929.25 ml  Output 825 ml  Net 1104.25 ml   Filed Weights   04/20/17 0309 04/20/17 1618 04/21/17 0511  Weight: 45.1 kg (99 lb 6.8 oz) 44.9 kg (99 lb) 47.9 kg (105 lb 9.6 oz)    Examination:  General exam:  Awake; continuous whole body tremors Respiratory system: Bilateral decreased breath sound at bases Cardiovascular system: S1 & S2 heard, rate controlled  Gastrointestinal system: Abdomen is nondistended, soft and nontender. Normal bowel sounds heard. Extremities: No cyanosis, clubbing, edema   Data Reviewed: I have personally reviewed following labs and imaging studies  CBC: Recent Labs  Lab 04/17/17 2211 04/18/17 0745 04/19/17 0534 04/20/17 0441 04/21/17 1039  WBC 9.3 8.1 11.5*  13.6* 9.3  NEUTROABS 7.3 5.5 9.0* 11.0*  --   HGB 11.0* 11.1* 10.6* 10.7* 10.7*  HCT 33.8* 33.4* 32.6* 33.1* 32.8*  MCV 101.5* 102.1* 101.9* 101.5* 101.9*  PLT 113* 109* 103* 113* 126*   Basic Metabolic Panel: Recent  Labs  Lab 04/17/17 2211 04/18/17 0745 04/19/17 0534 04/20/17 0441 04/21/17 1039  NA 144 143 145 152* 149*  K 4.1 3.8 4.0 3.8 3.4*  CL 115* 115* 116* 121* 118*  CO2 22 18* 24 23 22   GLUCOSE 104* 83 120* 116* 99  BUN 29* 26* 36* 42* 42*  CREATININE 1.54* 1.48* 1.43* 1.61* 1.57*  CALCIUM 9.1 9.2 9.4 9.6 9.1  MG 2.2  --  2.3 2.4 2.6*   GFR: Estimated Creatinine Clearance: 39 mL/min (A) (by C-G formula based on SCr of 1.57 mg/dL (H)). Liver Function Tests: Recent Labs  Lab 04/17/17 2211 04/18/17 0745 04/19/17 0534 04/20/17 0441  AST 31 39 35 58*  ALT 20 21 24  38  ALKPHOS 101 113 121 127*  BILITOT 0.6 1.0 1.0 0.7  PROT 7.0 7.4 7.2 7.7  ALBUMIN 3.9 4.2 4.0 4.1   Recent Labs  Lab 04/17/17 2211  LIPASE 29   Recent Labs  Lab 04/18/17 0225  AMMONIA 41*   Coagulation Profile: No results for input(s): INR, PROTIME in the last 168 hours. Cardiac Enzymes: Recent Labs  Lab 04/17/17 2224  CKTOTAL 121   BNP (last 3 results) No results for input(s): PROBNP in the last 8760 hours. HbA1C: No results for input(s): HGBA1C in the last 72 hours. CBG: Recent Labs  Lab 04/17/17 2133  GLUCAP 104*   Lipid Profile: No results for input(s): CHOL, HDL, LDLCALC, TRIG, CHOLHDL, LDLDIRECT in the last 72 hours. Thyroid Function Tests: No results for input(s): TSH, T4TOTAL, FREET4, T3FREE, THYROIDAB in the last 72 hours. Anemia Panel: No results for input(s): VITAMINB12, FOLATE, FERRITIN, TIBC, IRON, RETICCTPCT in the last 72 hours. Sepsis Labs: Recent Labs  Lab 04/17/17 2224 04/18/17 0011  LATICACIDVEN 2.60* 1.04    Recent Results (from the past 240 hour(s))  Urine culture     Status: None   Collection Time: 04/17/17 10:11 PM  Result Value Ref Range Status   Specimen Description URINE, CLEAN CATCH  Final   Special Requests NONE  Final   Culture   Final    NO GROWTH Performed at [facility], [address], [place], [place] [address]    Report Status 04/19/2017  FINAL  Final         Radiology Studies: Mr Brain Wo Contrast  Result Date: 04/20/2017 CLINICAL DATA:  Worsening tremors. History of traumatic brain injury, seizures, cirrhosis, encephalopathy, alcohol abuse. EXAM: MRI HEAD WITHOUT CONTRAST TECHNIQUE: Multiplanar, multiecho pulse sequences of the brain and surrounding structures were obtained without intravenous contrast. COMPARISON:  CT HEAD April 17, 2017 and MRI of the head February 21, 2015 FINDINGS: BRAIN: No reduced diffusion to suggest acute ischemia or postictal state. LEFT frontotemporal parietal encephalomalacia associated with susceptibility artifact with LEFT temporal porencephaly, LEFT hippocampal volume loss. Ex vacuo dilatation LEFT lateral ventricle. Atrophic LEFT cerebral peduncle volume loss compatible with wallerian degeneration. No hydrocephalus. No midline shift, mass effect or masses. Chronic small LEFT middle cranial fossa subdural collection. VASCULAR: Normal major intracranial vascular flow voids present at skull base. SKULL AND UPPER CERVICAL SPINE: No abnormal sellar expansion. No suspicious calvarial bone marrow signal. Craniocervical junction maintained. SINUSES/ORBITS: The mastoid air-cells and included paranasal sinuses are well-aerated. The included ocular  globes and orbital contents are non-suspicious. OTHER: None. IMPRESSION: 1. No acute intracranial process. 2. 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 Electronically Signed   By: Awilda Metroourtnay  Bloomer M.D.   On: 04/20/2017 20:08   [place] Abdomen Complete  Result Date: 04/19/2017 CLINICAL DATA:  Abdominal pain and elevated ammonia. EXAM: ABDOMEN ULTRASOUND COMPLETE COMPARISON:  None. FINDINGS: Gallbladder: Status post cholecystectomy.  Surgically absent. Common bile duct: Diameter: 4 mm. Liver: The liver demonstrates coarse echotexture and mildly increased  echogenicity, likely reflecting steatosis. No overt cirrhotic contour abnormalities or focal lesions are identified. There is no evidence of intrahepatic biliary ductal dilatation. Portal vein is patent on color Doppler imaging with normal direction of blood flow towards the liver. IVC: No abnormality visualized. Pancreas: Visualized portion unremarkable. Spleen: Size and appearance within normal limits. Right Kidney: Length: 9.1 cm. Echogenicity within normal limits. No mass or hydronephrosis visualized. Left Kidney: Length: 10.7 cm. Echogenicity within normal limits. No mass or hydronephrosis visualized. Abdominal aorta: No aneurysm visualized. Other findings: None. IMPRESSION: Coarse echotexture and mildly increased echogenicity of the liver likely reflects steatosis. There is no evidence of overt cirrhosis, hepatic mass or biliary obstruction. Electronically Signed   By: Irish LackGlenn  Yamagata M.D.   On: 04/19/2017 16:43        Scheduled Meds: . amantadine  100 mg Oral BID  . bromocriptine  5 mg Oral BID  . clonazePAM  1 mg Oral QHS  . enoxaparin (LOVENOX) injection  40 mg Subcutaneous Q24H  . escitalopram  5 mg Oral Daily  . feeding supplement (ENSURE ENLIVE)  237 mL Oral TID BM  . lacosamide  50 mg Oral BID  . LORazepam  0.5 mg Intravenous Once  . oxcarbazepine  600 mg Oral BID  . pantoprazole  40 mg Oral Daily  . potassium chloride  60 mEq Oral Once  . topiramate  100 mg Oral BID   Continuous Infusions: . dextrose 75 mL/hr at 04/20/17 2257  . lactated ringers 10 mL/hr at 04/20/17 1713     LOS: 3 days        Glade LloydKshitiz Loribeth Katich, MD [facility] Pager [phone]  If 7PM-7AM, please contact night-coverage [email or web address] Password [email or web address] 04/21/2017, 1:41 PM

## 2017-04-22 DIAGNOSIS — K7030 Alcoholic cirrhosis of liver without ascites: Secondary | ICD-10-CM

## 2017-04-22 LAB — BASIC METABOLIC PANEL
Anion gap: 6 (ref 5–15)
BUN: 34 mg/dL — AB (ref 6–20)
CHLORIDE: 121 mmol/L — AB (ref 101–111)
CO2: 21 mmol/L — AB (ref 22–32)
CREATININE: 1.4 mg/dL — AB (ref 0.61–1.24)
Calcium: 9.1 mg/dL (ref 8.9–10.3)
GFR calc Af Amer: >60 mL/min (ref >60)
GFR calc non Af Amer: 58 mL/min — ABNORMAL LOW (ref >60)
Glucose, Bld: 108 mg/dL — ABNORMAL HIGH (ref 65–99)
Potassium: 3.6 mmol/L (ref 3.5–5.1)
Sodium: 148 mmol/L — ABNORMAL HIGH (ref 135–145)

## 2017-04-22 LAB — CBC WITH DIFFERENTIAL/PLATELET
Basophils Absolute: 0 10*3/uL (ref 0.0–0.1)
Basophils Relative: 0 %
Eosinophils Absolute: 0.1 10*3/uL (ref 0.0–0.7)
Eosinophils Relative: 1 %
HEMATOCRIT: 30.9 % — AB (ref 39.0–52.0)
HEMOGLOBIN: 10.1 g/dL — AB (ref 13.0–17.0)
LYMPHS ABS: 1.4 10*3/uL (ref 0.7–4.0)
Lymphocytes Relative: 16 %
MCH: 33.1 pg (ref 26.0–34.0)
MCHC: 32.7 g/dL (ref 30.0–36.0)
MCV: 101.3 fL — AB (ref 78.0–100.0)
MONOS PCT: 12 %
Monocytes Absolute: 1.1 10*3/uL — ABNORMAL HIGH (ref 0.1–1.0)
NEUTROS ABS: 6.2 10*3/uL (ref 1.7–7.7)
NEUTROS PCT: 71 %
Platelets: 125 10*3/uL — ABNORMAL LOW (ref 150–400)
RBC: 3.05 MIL/uL — ABNORMAL LOW (ref 4.22–5.81)
RDW: 13.3 % (ref 11.5–15.5)
WBC: 8.8 10*3/uL (ref 4.0–10.5)

## 2017-04-22 LAB — MAGNESIUM: Magnesium: 2.2 mg/dL (ref 1.7–2.4)

## 2017-04-22 MED ORDER — RIFAXIMIN 550 MG PO TABS
550.0000 mg | ORAL_TABLET | Freq: Two times a day (BID) | ORAL | Status: DC
  Administered 2017-04-22 – 2017-04-23 (×2): 550 mg via ORAL
  Filled 2017-04-22 (×2): qty 1

## 2017-04-22 MED ORDER — POLYETHYLENE GLYCOL 3350 17 G PO PACK
17.0000 g | PACK | Freq: Two times a day (BID) | ORAL | Status: DC
  Administered 2017-04-22 – 2017-04-29 (×12): 17 g via ORAL
  Filled 2017-04-22 (×13): qty 1

## 2017-04-22 NOTE — Progress Notes (Signed)
Physical Therapy Treatment Patient Details Name: Hector NianRobert W [name] MRN: [id number] DOB: 12/29/1970 Today's Date: 04/22/2017    History of Present Illness 46 year old male with history of traumatic brain injury, seizure disorder, question of alcohol liver cirrhosis presented with worsening tremors    PT Comments    The patient's father present for support of the patient to participate in therapy which the patient did. The patient presents with ballistic movement and tremors constantly. The patient ambulated x 50' with a Rw and 2 assist for balance. The patient's father  Agrees with pursuing SNF as the patient's functional status is far from baseline in that patient was ambulatory without assistance prior to worsening tremors. Pt admitted with above diagnosis. Pt currently with functional limitations due to the deficits listed below (see PT Problem List).  Pt will benefit from skilled PT to increase their independence and safety with mobility to allow discharge to the venue listed below.      Follow Up Recommendations  SNF     Equipment Recommendations  None recommended by PT    Recommendations for Other Services       Precautions / Restrictions Precautions Precautions: Fall Precaution Comments: can be agitated Restrictions Weight Bearing Restrictions: No    Mobility  Bed Mobility Overal bed mobility: Needs Assistance Bed Mobility: Supine to Sit;Sit to Supine     Supine to sit: Min guard Sit to supine: Min guard   General bed mobility comments: patient is ballistic with his movements, cooperative for sitting up, flailed self back into bed.  Transfers Overall transfer level: Needs assistance Equipment used: Rolling walker (2 wheeled) Transfers: Sit to/from Stand Sit to Stand: Mod assist         General transfer comment: steady assist as patient is ballistic and ataxic in all extremities and trunk  Ambulation/Gait Ambulation/Gait assistance: Mod assist;+2 physical  assistance;+2 safety/equipment Ambulation Distance (Feet): 50 Feet Assistive device: Rolling walker (2 wheeled) Gait Pattern/deviations: Step-to pattern;Ataxic;Staggering right;Staggering left;Trunk flexed     General Gait Details: noted left leg is  less controlled and adducts and inverts at the ankle.Marland Kitchen. Requires much assistance for stability and for guiding the RW.    Stairs            Wheelchair Mobility    Modified Rankin (Stroke Patients Only)       Balance Overall balance assessment: Needs assistance Sitting-balance support: Feet supported;Bilateral upper extremity supported Sitting balance-Leahy Scale: Poor Sitting balance - Comments: noted trunk and extrmitiy tremors   Standing balance support: During functional activity;Bilateral upper extremity supported Standing balance-Leahy Scale: Zero                              Cognition Arousal/Alertness: Awake/alert Behavior During Therapy: WFL for tasks assessed/performed Overall Cognitive Status: Difficult to assess                                 General Comments: per father, the patient is able to express needs prior to the worsening of tremors. the patient followed simple commands.      Exercises      General Comments        Pertinent Vitals/Pain Pain Assessment: Faces Pain Score: 0-No pain Faces Pain Scale: No hurt    Home Living Family/patient expects to be discharged to:: Unsure  Additional Comments: lives with father. Has been able to perform most adls prior to this admission, ?supervision/min guard    Prior Function            PT Goals (current goals can now be found in the care plan section) Acute Rehab PT Goals Patient Stated Goal: fathers:  get back to PLOF Progress towards PT goals: Progressing toward goals    Frequency    Min 2X/week      PT Plan Discharge plan needs to be updated;Current plan remains appropriate     Co-evaluation              AM-PAC PT "6 Clicks" Daily Activity  Outcome Measure  Difficulty turning over in bed (including adjusting bedclothes, sheets and blankets)?: A Lot Difficulty moving from lying on back to sitting on the side of the bed? : A Lot Difficulty sitting down on and standing up from a chair with arms (e.g., wheelchair, bedside commode, etc,.)?: Unable Help needed moving to and from a bed to chair (including a wheelchair)?: Total   Help needed climbing 3-5 steps with a railing? : Total 6 Click Score: 7    End of Session Equipment Utilized During Treatment: Gait belt Activity Tolerance: Patient tolerated treatment well Patient left: in bed;with call bell/phone within reach;with family/visitor present;with nursing/sitter in room Nurse Communication: Mobility status PT Visit Diagnosis: Difficulty in walking, not elsewhere classified (R26.2)     Time: [id number] PT Time Calculation (min) (ACUTE ONLY): 16 min  Charges:  $Gait Training: 8-22 mins                    G Codes:         Rada HayHill, Mareli Antunes Elizabeth 04/22/2017, 1:41 PM  Blanchard KelchKaren Eyvonne Burchfield PT [phone]

## 2017-04-22 NOTE — Progress Notes (Signed)
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 Neurology was consulted. Psychiatry was also consulted.      Assessment & Plan:  Principal Problem:   Coarse tremors Active Problems:   Alcoholic cirrhosis of liver without ascites (HCC)   Seizure (HCC)   ARF (acute renal failure) (HCC)   Tremor The best understanding I can get from father/primary caregiver report and from previous notes is that the patient was having clonic jerking episodes that were progrssively worse and more frequent and that interfered with ADLs as well as ataxia.    He was evaluated by neurology, who found he had a normal brain MRI (under anesthesia), and normal EEG.  They recommended decreasing doses of bromocriptine, Lexapro, Seroquel, and discontinuing Linzess, as all of these could contribute to tremor.  If needed, all but Linzess could be restarted gradually, and one at a time.  Ultimately, if not from medications, it was suspected that the patient had had some dehydration or a viral syndrome that caused some decompensation of his baseline aphasia, ataxia and tremor in the setting of polypharmacy.  He was evaluated by PT and recommended to go to SNF for rehab. - Linzezz sotpped -Seroquel stopped -Continue lower dose LExapro - PT eval  Cirrhosis The patient has a long history of cirrhosis, follows with [facility]. Marcelene [facility]ammie [facility].  He is intolerant or allergic to lactulose.  on admission he was noted to have high ammonia.  Clinically hepatic encephalopathy was obviously hard to discern given his baseline cognitive function.  I discussed treatment of hepatic  encephalopathy with [facility], Dr. Aline AugustHolmes, who felt a suboptimal alternative that might be suitable would be starting rifaximin and titrating MiraLAX to have 2-3 soft BM per day. -Start MiraLAX and rifaximin  Hypernatremia From altered mental statusa nd poor access to free water -D5 at 75 cc/hr -BMP in AM  Seizure disorder No seizures in the hospital -Continue Vimpat, Topamax, Trileptal  Macrocytic anemia Stable  Thrombocytopenia Mild, unclear etiology  Depression -Decrease Lexapro dose, stopped Seroquel  Other medicaitons -Continue clonazepam -Continue bromocriptine -Continue amantadine -Continue PPI    Code Status: FULL Family Communication: Father Disposition Plan: LNow that work up is unremarkable, tremors are improved, likely to SNF in 1-2 days when able to take PO, sodium improved   Consultants:   Neurology  Psychiatry  Procedures:   MRI brain  EEG  Antimicrobials:   None   Subjective: A phasic, only able to respond in 1 word answers.  Seems to be alert to my presence.     Objective: Vitals:   04/21/17 1446 04/21/17 2215 04/22/17 0551 04/22/17 1412  BP: 95/84 98/66 (!) 104/59 (!) 104/53  Pulse: 84 85 70 72  Resp:  18 18 18   Temp: 98.9 F (37.2 C) 98.5 F (36.9 C)  98.9 F (37.2 C)  TempSrc: Axillary Oral    SpO2: 94% 95% 98% 95%  Weight:      Height:        Intake/Output Summary (Last 24 hours) at 04/22/2017 2011 Last data filed at 04/22/2017 1700 Gross per 24 hour  Intake 2255 ml  Output 1175 ml  Net 1080  ml   Filed Weights   04/20/17 0309 04/20/17 1618 04/21/17 0511  Weight: 45.1 kg (99 lb 6.8 oz) 44.9 kg (99 lb) 47.9 kg (105 lb 9.6 oz)    Examination: General appearance: Thin adult male, awake but aphasic.  In mitts.   HEENT: Anicteric, conjunctiva pink, lids and lashes normal. No nasal deformity, discharge, epistaxis.  Lipsdry and dry OP membranes.  Old trach scar. Skin: Warm and dry.  No jaundice.  No suspicious rashes or  lesions. Cardiac: RRR, nl S1-S2, no murmurs appreciated.  Capillary refill is brisk.  JVP normal  No LE edema.  Radial pulses 2+ and symmetric. Respiratory: Normal respiratory rate and rhythm.  CTAB without rales or wheezes. Abdomen: Abdomen soft.  Voluntary guarding, no TTP. No ascites, distension, hepatosplenomegaly.   MSK: No deformities or effusions. Neuro: Awake and alert to me, but not able to answer any questions, states "Research scientist (physical sciences)" only.  Seems t moves all extremities, I do not note a right weakness, although he doesn not follow commands consistently. Psych: Unable to assess.    Data Reviewed: I have personally reviewed following labs and imaging studies:  CBC: Recent Labs  Lab 04/17/17 2211 04/18/17 0745 04/19/17 0534 04/20/17 0441 04/21/17 1039 04/22/17 0539  WBC 9.3 8.1 11.5* 13.6* 9.3 8.8  NEUTROABS 7.3 5.5 9.0* 11.0*  --  6.2  HGB 11.0* 11.1* 10.6* 10.7* 10.7* 10.1*  HCT 33.8* 33.4* 32.6* 33.1* 32.8* 30.9*  MCV 101.5* 102.1* 101.9* 101.5* 101.9* 101.3*  PLT 113* 109* 103* 113* 126* 125*   Basic Metabolic Panel: Recent Labs  Lab 04/17/17 2211 04/18/17 0745 04/19/17 0534 04/20/17 0441 04/21/17 1039 04/22/17 0539  NA 144 143 145 152* 149* 148*  K 4.1 3.8 4.0 3.8 3.4* 3.6  CL 115* 115* 116* 121* 118* 121*  CO2 22 18* 24 23 22  21*  GLUCOSE 104* 83 120* 116* 99 108*  BUN 29* 26* 36* 42* 42* 34*  CREATININE 1.54* 1.48* 1.43* 1.61* 1.57* 1.40*  CALCIUM 9.1 9.2 9.4 9.6 9.1 9.1  MG 2.2  --  2.3 2.4 2.6* 2.2   GFR: Estimated Creatinine Clearance: 43.7 mL/min (A) (by C-G formula based on SCr of 1.4 mg/dL (H)). Liver Function Tests: Recent Labs  Lab 04/17/17 2211 04/18/17 0745 04/19/17 0534 04/20/17 0441  AST 31 39 35 58*  ALT 20 21 24  38  ALKPHOS 101 113 121 127*  BILITOT 0.6 1.0 1.0 0.7  PROT 7.0 7.4 7.2 7.7  ALBUMIN 3.9 4.2 4.0 4.1   Recent Labs  Lab 04/17/17 2211  LIPASE 29   Recent Labs  Lab 04/18/17 0225  AMMONIA 41*   Coagulation  Profile: No results for input(s): INR, PROTIME in the last 168 hours. Cardiac Enzymes: Recent Labs  Lab 04/17/17 2224  CKTOTAL 121   BNP (last 3 results) No results for input(s): PROBNP in the last 8760 hours. HbA1C: No results for input(s): HGBA1C in the last 72 hours. CBG: Recent Labs  Lab 04/17/17 2133  GLUCAP 104*   Lipid Profile: No results for input(s): CHOL, HDL, LDLCALC, TRIG, CHOLHDL, LDLDIRECT in the last 72 hours. Thyroid Function Tests: No results for input(s): TSH, T4TOTAL, FREET4, T3FREE, THYROIDAB in the last 72 hours. Anemia Panel: No results for input(s): VITAMINB12, FOLATE, FERRITIN, TIBC, IRON, RETICCTPCT in the last 72 hours. Urine analysis:    Component Value Date/Time   COLORURINE YELLOW 04/17/2017 2211   APPEARANCEUR CLEAR 04/17/2017 2211   LABSPEC 1.013 04/17/2017 2211   PHURINE 7.0 04/17/2017  2211   GLUCOSEU NEGATIVE 04/17/2017 2211   HGBUR NEGATIVE 04/17/2017 2211   BILIRUBINUR NEGATIVE 04/17/2017 2211   KETONESUR NEGATIVE 04/17/2017 2211   PROTEINUR NEGATIVE 04/17/2017 2211   UROBILINOGEN 1.0 02/12/2015 1912   NITRITE NEGATIVE 04/17/2017 2211   LEUKOCYTESUR NEGATIVE 04/17/2017 2211   Sepsis Labs: @LABRCNTIP (procalcitonin:4,lacticacidven:4)  ) Recent Results (from the past 240 hour(s))  Urine culture     Status: None   Collection Time: 04/17/17 10:11 PM  Result Value Ref Range Status   Specimen Description URINE, CLEAN CATCH  Final   Special Requests NONE  Final   Culture   Final    NO GROWTH Performed at [facility], [address] [place], [place] [address]    Report Status 04/19/2017 FINAL  Final         Radiology Studies: No results found.      Scheduled Meds: . amantadine  100 mg Oral BID  . bromocriptine  5 mg Oral BID  . clonazePAM  1 mg Oral QHS  . escitalopram  5 mg Oral Daily  . feeding supplement (ENSURE ENLIVE)  237 mL Oral TID BM  . lacosamide  50 mg Oral BID  . oxcarbazepine  600 mg Oral BID  .  pantoprazole  40 mg Oral Daily  . polyethylene glycol  17 g Oral BID  . rifaximin  550 mg Oral BID  . topiramate  100 mg Oral BID   Continuous Infusions: . dextrose 75 mL/hr at 04/22/17 1610  . lactated ringers 10 mL/hr at 04/20/17 1713     LOS: 4 days    Time spent: 45 minutes    Alberteen Samhristopher P Danford, MD [facility] 04/22/2017, 12:15 PM     Pager [phone] --- please page though AMION:  [email or web address] Password [email or web address] If 7PM-7AM, please contact night-coverage

## 2017-04-22 NOTE — Evaluation (Signed)
Occupational Therapy Evaluation Patient Details Name: Hector [name] MRN: [id number] DOB: 06-13-1970 Today's Date: [name]    History of Present Illness 46 year old male with history of traumatic brain injury, seizure disorder, question of alcohol liver cirrhosis presented with worsening tremors   Clinical Impression   Pt was admitted for the above.  At baseline, his father reports that he mostly completes adls on his own.  He self-limited adl participation but was able to drink with set up, eat mashed potatoes with set up and comb his hair with min A.  Will follow in acute setting with set up to min A level goals.  Pt did not get OOB nor reach don sock     Follow Up Recommendations  SNF    Equipment Recommendations  (to be further assessed)    Recommendations for Other Services       Precautions / Restrictions Precautions Precautions: Fall Precaution Comments: can be agitated.  He cannot form words, but grunts and arm movement is animated  Restrictions Weight Bearing Restrictions: No      Mobility Bed Mobility                  Transfers                      Balance                                           ADL either performed or assessed with clinical judgement   ADL Overall ADL's : Needs assistance/impaired Eating/Feeding: Minimal assistance;Bed level   Grooming: Brushing hair;Minimal assistance;Sitting   Upper Body Bathing: Moderate assistance;Sitting   Lower Body Bathing: Maximal assistance;Sit to/from stand;+2 for safety/equipment   Upper Body Dressing : Moderate assistance;Sitting   Lower Body Dressing: Maximal assistance;Sit to/from stand;+2 for safety/equipment                 General ADL Comments: limited participation in ADLs. Pt did not want to eat, but he drank a few sips of tea and ensure:  cup with lid.  Pt able to eat one bite of mashed potatoes with spoon, including scooping.  Based on clinical  judgment:  pt will need assistance with fork.  Pt self-limited what he would do.  Much of assessment is based on clinical judgment (outside of eating and grooming)     Vision         Perception     Praxis      Pertinent Vitals/Pain Pain Assessment: Faces Pain Score: 0-No pain     Hand Dominance Right   Extremity/Trunk Assessment Upper Extremity Assessment RUE Deficits / Details: lifts both arms above head; good grips;  Tremors present but pt able to use hands functionally.  Gets near nose when asked to touch nose.  Pt is missing 5th digit on R hand           Communication Communication Communication: Expressive difficulties   Cognition Arousal/Alertness: Awake/alert Behavior During Therapy: WFL for tasks assessed/performed                                       General Comments       Exercises     Shoulder Instructions      Home Living Family/patient expects to be  discharged to:: Unsure                                 Additional Comments: lives with father. Has been able to perform most adls prior to this admission, ?supervision/min guard      Prior Functioning/Environment                   OT Problem List: Decreased strength;Decreased activity tolerance;Decreased knowledge of use of DME or AE;Impaired UE functional use(balance NT; Hector [name] NT)      OT Treatment/Interventions: Self-care/ADL training;DME and/or AE instruction;Visual/perceptual remediation/compensation;Patient/family education;Therapeutic activities    OT Goals(Current goals can be found in the care plan section) Acute Rehab OT Goals Patient Stated Goal: fathers:  get back to PLOF OT Goal Formulation: With patient/family Potential to Achieve Goals: Good ADL Goals Pt Will Perform Eating: with set-up;sitting Pt Will Perform Grooming: with set-up;sitting Additional ADL Goal #1: pt will complete UB adls with set up seated Additional ADL Goal #2: Pt will complete  LB adls with min A, sit to stand  OT Frequency: Min 2X/week   Barriers to D/C:            Co-evaluation              AM-PAC PT "6 Clicks" Daily Activity     Outcome Measure Help from another person eating meals?: A Little Help from another person taking care of personal grooming?: A Little Help from another person toileting, which includes using toliet, bedpan, or urinal?: A Lot Help from another person bathing (including washing, rinsing, drying)?: A Lot Help from another person to put on and taking off regular upper body clothing?: A Lot Help from another person to put on and taking off regular lower body clothing?: A Lot 6 Click Score: 14   End of Session    Activity Tolerance: (self limits participation) Patient left: in bed;with call bell/phone within reach;with nursing/sitter in room  OT Visit Diagnosis: Muscle weakness (generalized) (M62.81);Other symptoms and signs involving the nervous system (R29.898)                Time: [id number] OT Time Calculation (min): 14 min Charges:  OT General Charges $OT Visit: 1 Visit OT Evaluation $OT Eval Low Complexity: 1 Low G-Codes:     Hector [name], OTR/L [phone] [name]  Hector [name], 1:18 PM

## 2017-04-22 NOTE — Progress Notes (Addendum)
Initial Nutrition Assessment  DOCUMENTATION CODES:   Severe malnutrition in context of chronic illness, Underweight  INTERVENTION:   Ensure Enlive po TID, each supplement provides 350 kcal and 20 grams of protein  NUTRITION DIAGNOSIS:   Severe Malnutrition related to chronic illness(TBI) as evidenced by 19.5% weight loss in 7 months, moderate fat depletion, severe fat depletion, moderate muscle depletion, severe muscle depletion.  GOAL:   Patient will meet greater than or equal to 90% of their needs  MONITOR:   PO intake, Supplement acceptance, Weight trends, Labs  REASON FOR ASSESSMENT:   Other (Comment)(low BMI)   ASSESSMENT:   Pt with PMH significant for TBI, seizures, and expressive aphasia. Recently admitted for generalized weakness and dehydration. Presents this admission with worsening tremors with questionable cause.    Pt unable to answer questions. Able to nod head yes or no but verbally states no to every question. No family at bedside to provide further diet information. Pt's eating has been off and on this admission. Spoke with nurse who reports pt has great appetite upon admission but it has slowly declined each day. Meal completions charted as 25% average for his last 8 meals. Unsure if pt is consuming supplementation at this time. Pt has history of swallowing issues, but has remained on regular diet with thin liquids this stay.   Pt noted to have weighed 123 lb in May 2018 and 99 lb this admission. This shows a 19.5 % wt loss in 7 months which is significant.   Nutrition-Focused physical exam completed. Unsure of pt's mobility at this time. Bilateral lower extremity depletions could be the result of immobility and poor intake.   Medications reviewed and include: D5 @ 75 ml/hr Labs reviewed: Na 148 (H) Cl 121 (H) BUN 34 (H) Creatinine 1.40 (H) ammonia 41 (H)  NUTRITION - FOCUSED PHYSICAL EXAM:  Findings are moderate to severe fat depletion, moderate to severe  muscle depletion, and mild edema.   Diet Order:  Diet regular Room service appropriate? Yes; Fluid consistency: Thin  EDUCATION NEEDS:   Not appropriate for education at this time  Skin:  Skin Assessment: Reviewed RN Assessment  Last BM:  PTA  Height:   Ht Readings from Last 1 Encounters:  04/20/17 5\' 10"  (1.778 m)    Weight:   Wt Readings from Last 1 Encounters:  04/21/17 105 lb 9.6 oz (47.9 kg)    Ideal Body Weight:  75.5 kg  BMI:  Body mass index is 15.15 kg/m.  Estimated Nutritional Needs:   Kcal:  1400-1600 kcal/day  Protein:  60-70 g/day  Fluid:  >1.4 L/day    Vanessa Kickarly Branko Steeves RD, LDN Clinical Nutrition Pager # [phone]

## 2017-04-22 NOTE — Evaluation (Signed)
Physical Therapy Evaluation Patient Details Name: Hector NianRobert W [name] MRN: [id number] DOB: Jun 27, 1970 Today's Date: 04/22/2017   History of Present Illness  46 year old male with history of traumatic brain injury, seizure disorder, question of alcohol liver cirrhosis presented with worsening tremors  Clinical Impression  The patient did not cooperate in evaluation for mobility today. No family present for information  Relating  Patient's prior level of function. Unsure of PT indication if patient is unable to participate. Last admission, patient ambulated x 100' with RW. Pt admitted with above diagnosis. Pt currently with functional limitations due to the deficits listed below (see PT Problem List).  Pt will benefit from skilled PT to increase their independence and safety with mobility to allow discharge to the venue listed below.       Follow Up Recommendations (unceertain of baseline)    Equipment Recommendations  None recommended by PT    Recommendations for Other Services       Precautions / Restrictions Precautions Precautions: Fall Precaution Comments: can be agitated      Mobility  Bed Mobility               General bed mobility comments: could not get patient to participate, pushes himself up in the bed using both hands  Transfers                 General transfer comment: patient did not participate, threw covers back over the legs  Ambulation/Gait             General Gait Details: unable to assess  Stairs            Wheelchair Mobility    Modified Rankin (Stroke Patients Only)       Balance                                             Pertinent Vitals/Pain Pain Assessment: Faces Faces Pain Scale: No hurt    Home Living Family/patient expects to be discharged to:: Private residence Living Arrangements: Parent               Additional Comments: per Charity fundraiserN. No family present.  Pt unable to answer questions     Prior Function           Comments: unsure, patient does not answer     Hand Dominance        Extremity/Trunk Assessment   Upper Extremity Assessment Upper Extremity Assessment: RUE deficits/detail;LUE deficits/detail RUE Deficits / Details: unable to assess due to MS, does raise arms, in mits LUE Deficits / Details: same    Lower Extremity Assessment Lower Extremity Assessment: RLE deficits/detail;LLE deficits/detail RLE Deficits / Details: legs are extended mostly, LLE Deficits / Details: same       Communication   Communication: Expressive difficulties;Receptive difficulties  Cognition Arousal/Alertness: Awake/alert Behavior During Therapy: Restless                                   General Comments: beating hands onself, noted constnat tremors of all extremities      General Comments      Exercises     Assessment/Plan    PT Assessment Patient needs continued PT services  PT Problem List Decreased cognition;Decreased mobility;Decreased activity tolerance       PT Treatment  Interventions Gait training;Functional mobility training;Cognitive remediation    PT Goals (Current goals can be found in the Care Plan section)  Acute Rehab PT Goals PT Goal Formulation: Patient unable to participate in goal setting Time For Goal Achievement: 05/06/17 Potential to Achieve Goals: Fair    Frequency Min 2X/week   Barriers to discharge   uncertain    Co-evaluation               AM-PAC PT "6 Clicks" Daily Activity  Outcome Measure Difficulty turning over in bed (including adjusting bedclothes, sheets and blankets)?: Unable Difficulty moving from lying on back to sitting on the side of the bed? : Unable Difficulty sitting down on and standing up from a chair with arms (e.g., wheelchair, bedside commode, etc,.)?: Unable Help needed moving to and from a bed to chair (including a wheelchair)?: Total Help needed walking in hospital room?:  Total Help needed climbing 3-5 steps with a railing? : Total 6 Click Score: 6    End of Session   Activity Tolerance: Treatment limited secondary to agitation Patient left: in bed;with call bell/phone within reach;with bed alarm set;with nursing/sitter in room Nurse Communication: Mobility status PT Visit Diagnosis: Difficulty in walking, not elsewhere classified (R26.2)    Time: [id number] PT Time Calculation (min) (ACUTE ONLY): 10 min   Charges:   PT Evaluation $PT Eval Low Complexity: 1 Low     PT G CodesBlanchard Kelch:        Vendetta Pittinger PT [phone]   Rada HayHill, Shadasia [place] Elizabeth 04/22/2017, 9:10 AM

## 2017-04-22 NOTE — Progress Notes (Signed)
Waist restraint removed at 0930 12/28

## 2017-04-22 NOTE — Progress Notes (Signed)
Date: April 22, 2017 Treyshon Buchanon, BSN, RN3, CCM [phone] Chart and notes review for patient progress and needs. Will follow for case management and discharge needs. Next review date: [id number] 

## 2017-04-23 ENCOUNTER — Inpatient Hospital Stay (HOSPITAL_COMMUNITY): Payer: Medicaid Other

## 2017-04-23 DIAGNOSIS — K7200 Acute and subacute hepatic failure without coma: Secondary | ICD-10-CM

## 2017-04-23 LAB — CBC
HCT: 31.3 % — ABNORMAL LOW (ref 39.0–52.0)
Hemoglobin: 10.2 g/dL — ABNORMAL LOW (ref 13.0–17.0)
MCH: 32.3 pg (ref 26.0–34.0)
MCHC: 32.6 g/dL (ref 30.0–36.0)
MCV: 99.1 fL (ref 78.0–100.0)
PLATELETS: 123 10*3/uL — AB (ref 150–400)
RBC: 3.16 MIL/uL — ABNORMAL LOW (ref 4.22–5.81)
RDW: 12.9 % (ref 11.5–15.5)
WBC: 7.7 10*3/uL (ref 4.0–10.5)

## 2017-04-23 LAB — BASIC METABOLIC PANEL
ANION GAP: 6 (ref 5–15)
BUN: 28 mg/dL — AB (ref 6–20)
CALCIUM: 9 mg/dL (ref 8.9–10.3)
CO2: 21 mmol/L — ABNORMAL LOW (ref 22–32)
Chloride: 117 mmol/L — ABNORMAL HIGH (ref 101–111)
Creatinine, Ser: 1.14 mg/dL (ref 0.61–1.24)
GFR calc Af Amer: >60 mL/min (ref >60)
GLUCOSE: 109 mg/dL — AB (ref 65–99)
Potassium: 3.3 mmol/L — ABNORMAL LOW (ref 3.5–5.1)
Sodium: 144 mmol/L (ref 135–145)

## 2017-04-23 LAB — BLOOD GAS, ARTERIAL
Acid-base deficit: 7.1 mmol/L — ABNORMAL HIGH (ref 0.0–2.0)
BICARBONATE: 17 mmol/L — AB (ref 20.0–28.0)
Drawn by: 257701
O2 Content: 3 L/min
O2 Saturation: 89.9 %
PH ART: 7.351 (ref 7.350–7.450)
Patient temperature: 98.6
pCO2 arterial: 31.6 mmHg — ABNORMAL LOW (ref 32.0–48.0)
pO2, Arterial: 65.2 mmHg — ABNORMAL LOW (ref 83.0–108.0)

## 2017-04-23 MED ORDER — POTASSIUM CHLORIDE 20 MEQ/15ML (10%) PO SOLN
40.0000 meq | Freq: Two times a day (BID) | ORAL | Status: AC
  Administered 2017-04-23 – 2017-04-24 (×2): 40 meq via ORAL
  Filled 2017-04-23 (×2): qty 30

## 2017-04-23 MED ORDER — AMPICILLIN-SULBACTAM SODIUM 3 (2-1) G IJ SOLR
3.0000 g | Freq: Four times a day (QID) | INTRAMUSCULAR | Status: DC
  Administered 2017-04-23 – 2017-04-29 (×23): 3 g via INTRAVENOUS
  Filled 2017-04-23 (×25): qty 3

## 2017-04-23 MED ORDER — POTASSIUM CHLORIDE CRYS ER 20 MEQ PO TBCR
40.0000 meq | EXTENDED_RELEASE_TABLET | Freq: Once | ORAL | Status: AC
  Administered 2017-04-23: 40 meq via ORAL
  Filled 2017-04-23: qty 2

## 2017-04-23 NOTE — Progress Notes (Signed)
PROGRESS NOTE    Hector [name]  [id number] DOB: 1970-05-21 DOA: 04/17/2017 PCP: [name], [name], MD      Brief Narrative:  46 year old male with history of traumatic brain injury with SDH and subsequent seizure disorder, now aphasic and with some residual right sided weakness, and alcoholic cirrhosis followed by [facility], no varices who presented with worsening tremors or clonic jerking. Neurology was consulted. Psychiatry was also consulted.      Assessment & Plan:  Principal Problem:   Coarse tremors Active Problems:   Alcoholic cirrhosis of liver without ascites (HCC)   Seizure (HCC)   ARF (acute renal failure) (HCC)   Tremor The best understanding I can get from father/primary caregiver report and from previous notes is that the patient was having clonic jerking episodes that were progrssively worse and more frequent and that interfered with ADLs as well as ataxia.    He was evaluated by neurology, who found he had a normal brain MRI (under anesthesia), and normal EEG.  They recommended decreasing doses of bromocriptine, Lexapro, Seroquel, and discontinuing Linzess, as all of these could contribute to tremor.  If needed, all but Linzess could be restarted gradually, and one at a time.  Ultimately, if not from medications, it was suspected that the patient had had some dehydration or a viral syndrome that caused some decompensation of his baseline aphasia, ataxia and tremor in the setting of polypharmacy.  He was evaluated by PT and recommended to go to SNF for rehab. - Linzezz sotpped -Seroquel stopped -Continue lower dose LExapro - PT eval  Cirrhosis The patient has a long history of cirrhosis, follows with Dr. Marcelene ButteHammie from [facility].  He is intolerant or allergic to lactulose.  on admission he was noted to have high ammonia.  Clinically hepatic encephalopathy was obviously hard to discern given his baseline cognitive function.  I discussed treatment of hepatic  encephalopathy with [facility], Dr. Aline AugustHolmes, who felt a suboptimal alternative that might be suitable would be starting rifaximin and titrating MiraLAX to have 2-3 soft BM per day. -Start MiraLAX and titrate to 2-3 BM per day -Hold rifaximin for now, start if needed if no success with Miralax  Hypokalemia Mild -Supplement K  Hypernatremia Improved -Stop fluids -BMP in AM  Seizure disorder No seizures in the hospital -Continue Vimpat, Topamax, Trileptal  Macrocytic anemia Stable  Thrombocytopenia Mild, unclear etiology  Depression -Decrease Lexapro dose, stopped Seroquel  Other medicaitons -Continue clonazepam -Continue bromocriptine -Continue amantadine -Continue PPI    Code Status: FULL Family Communication: Father Disposition Plan: LNow that work up is unremarkable, tremors are improved, likely to SNF in 1-2 days when able to take PO, sodium improved   Consultants:   Neurology  Psychiatry  Procedures:   MRI brain  EEG  Antimicrobials:   None   Subjective: A phasic, only able to respond in 1 word answers.  Seems to be alert to my presence.     Objective: Vitals:   04/22/17 1412 04/22/17 2057 04/23/17 0620 04/23/17 0659  BP: (!) 104/53 100/66 (!) 100/54   Pulse: 72 79 73   Resp: 18 19 17    Temp: 98.9 F (37.2 C) 98.2 F (36.8 C) 97.8 F (36.6 C)   TempSrc:  Oral Oral   SpO2: 95% 95% 93%   Weight:    46.5 kg (102 lb 8.2 oz)  Height:        Intake/Output Summary (Last 24 hours) at 04/23/2017 1407 Last data filed at 04/23/2017  1327 Gross per 24 hour  Intake 1175 ml  Output 1050 ml  Net 125 ml   Filed Weights   04/20/17 1618 04/21/17 0511 04/23/17 0659  Weight: 44.9 kg (99 lb) 47.9 kg (105 lb 9.6 oz) 46.5 kg (102 lb 8.2 oz)    Examination: General appearance: Thin adult male, awake but aphasic.  In mitts.   HEENT: Anicteric, conjunctiva pink, lids and lashes normal. No nasal deformity, discharge, epistaxis.  Lipsdry and dry OP  membranes.  Old trach scar. Skin: Warm and dry.  No jaundice.  No suspicious rashes or lesions. Cardiac: RRR, nl S1-S2, no murmurs appreciated.  Capillary refill is brisk.  JVP normal  No LE edema.  Radial pulses 2+ and symmetric. Respiratory: Normal respiratory rate and rhythm.  CTAB without rales or wheezes. Abdomen: Abdomen soft.  Voluntary guarding, no TTP. No ascites, distension, hepatosplenomegaly.   MSK: No deformities or effusions. Neuro: Awake and alert to me, but not able to answer any questions, states "Research scientist (physical sciences)" only.  Seems t moves all extremities, I do not note a right weakness, although he doesn not follow commands consistently. Psych: Unable to assess.    Data Reviewed: I have personally reviewed following labs and imaging studies:  CBC: Recent Labs  Lab 04/17/17 2211 04/18/17 0745 04/19/17 0534 04/20/17 0441 04/21/17 1039 04/22/17 0539 04/23/17 0602  WBC 9.3 8.1 11.5* 13.6* 9.3 8.8 7.7  NEUTROABS 7.3 5.5 9.0* 11.0*  --  6.2  --   HGB 11.0* 11.1* 10.6* 10.7* 10.7* 10.1* 10.2*  HCT 33.8* 33.4* 32.6* 33.1* 32.8* 30.9* 31.3*  MCV 101.5* 102.1* 101.9* 101.5* 101.9* 101.3* 99.1  PLT 113* 109* 103* 113* 126* 125* 123*   Basic Metabolic Panel: Recent Labs  Lab 04/17/17 2211  04/19/17 0534 04/20/17 0441 04/21/17 1039 04/22/17 0539 04/23/17 0602  NA 144   < > 145 152* 149* 148* 144  K 4.1   < > 4.0 3.8 3.4* 3.6 3.3*  CL 115*   < > 116* 121* 118* 121* 117*  CO2 22   < > 24 23 22  21* 21*  GLUCOSE 104*   < > 120* 116* 99 108* 109*  BUN 29*   < > 36* 42* 42* 34* 28*  CREATININE 1.54*   < > 1.43* 1.61* 1.57* 1.40* 1.14  CALCIUM 9.1   < > 9.4 9.6 9.1 9.1 9.0  MG 2.2  --  2.3 2.4 2.6* 2.2  --    < > = values in this interval not displayed.   GFR: Estimated Creatinine Clearance: 52.1 mL/min (by C-G formula based on SCr of 1.14 mg/dL). Liver Function Tests: Recent Labs  Lab 04/17/17 2211 04/18/17 0745 04/19/17 0534 04/20/17 0441  AST 31 39 35 58*  ALT 20 21  24  38  ALKPHOS 101 113 121 127*  BILITOT 0.6 1.0 1.0 0.7  PROT 7.0 7.4 7.2 7.7  ALBUMIN 3.9 4.2 4.0 4.1   Recent Labs  Lab 04/17/17 2211  LIPASE 29   Recent Labs  Lab 04/18/17 0225  AMMONIA 41*   Coagulation Profile: No results for input(s): INR, PROTIME in the last 168 hours. Cardiac Enzymes: Recent Labs  Lab 04/17/17 2224  CKTOTAL 121   BNP (last 3 results) No results for input(s): PROBNP in the last 8760 hours. HbA1C: No results for input(s): HGBA1C in the last 72 hours. CBG: Recent Labs  Lab 04/17/17 2133  GLUCAP 104*   Lipid Profile: No results for input(s): CHOL, HDL, LDLCALC, TRIG, CHOLHDL,  LDLDIRECT in the last 72 hours. Thyroid Function Tests: No results for input(s): TSH, T4TOTAL, FREET4, T3FREE, THYROIDAB in the last 72 hours. Anemia Panel: No results for input(s): VITAMINB12, FOLATE, FERRITIN, TIBC, IRON, RETICCTPCT in the last 72 hours. Urine analysis:    Component Value Date/Time   COLORURINE YELLOW 04/17/2017 2211   APPEARANCEUR CLEAR 04/17/2017 2211   LABSPEC 1.013 04/17/2017 2211   PHURINE 7.0 04/17/2017 2211   GLUCOSEU NEGATIVE 04/17/2017 2211   HGBUR NEGATIVE 04/17/2017 2211   BILIRUBINUR NEGATIVE 04/17/2017 2211   KETONESUR NEGATIVE 04/17/2017 2211   PROTEINUR NEGATIVE 04/17/2017 2211   UROBILINOGEN 1.0 02/12/2015 1912   NITRITE NEGATIVE 04/17/2017 2211   LEUKOCYTESUR NEGATIVE 04/17/2017 2211   Sepsis Labs: @LABRCNTIP (procalcitonin:4,lacticacidven:4)  ) Recent Results (from the past 240 hour(s))  Urine culture     Status: None   Collection Time: 04/17/17 10:11 PM  Result Value Ref Range Status   Specimen Description URINE, CLEAN CATCH  Final   Special Requests NONE  Final   Culture   Final    NO GROWTH Performed at [facility], [address] [place], [place] [address]    Report Status 04/19/2017 FINAL  Final         Radiology Studies: No results found.      Scheduled Meds: . amantadine  100 mg Oral BID    . bromocriptine  5 mg Oral BID  . clonazePAM  1 mg Oral QHS  . escitalopram  5 mg Oral Daily  . feeding supplement (ENSURE ENLIVE)  237 mL Oral TID BM  . lacosamide  50 mg Oral BID  . oxcarbazepine  600 mg Oral BID  . pantoprazole  40 mg Oral Daily  . polyethylene glycol  17 g Oral BID  . rifaximin  550 mg Oral BID  . topiramate  100 mg Oral BID   Continuous Infusions: . lactated ringers 10 mL/hr at 04/20/17 1713     LOS: 5 days    Time spent: 15 minutes    Alberteen Samhristopher P Danford, MD [facility] 04/23/2017, 12:15 PM     Pager [phone] --- please page though AMION:  [email or web address] If 7PM-7AM, please contact night-coverage

## 2017-04-23 NOTE — Progress Notes (Addendum)
Called to bedside for change in patient status.  All of a sudden a few minutes ago, patient became suddenly very agitated, threw off blankets and gown, tried to get on all fours, was gesticulating and pointing and babbling.    BP 130/80   Pulse 80s   Resp 40   SpO2 84%   BMI 14.71 kg/m   Appeared pale, agitated.  Aware of me, similar mentation to previous.  Only able to babble "Research scientist (physical sciences)da da da" as usual, not able to reliably confirm pain in any location, nor dyspnea.  Only that he seems "sick".  Lungs with some rales.  Heart regular.      Assessment: Unclear event Aspiration?  PE?  Nausea?  MI? Obtain ECG, CXR and ABG.       ADDENDUM:  CXR shows new right airspace disease.  ABG shows pO2 64 on 3L.    Start Unasyn Trend CBC Pulmonary toilet Supplemental O2

## 2017-04-23 NOTE — Progress Notes (Signed)
P02 on ABG (3 lpm Shannon) 65.2. RT called RN to increase to 4 lpm and check Sp02 as follow up- RN agrees.

## 2017-04-23 NOTE — Progress Notes (Signed)
Pharmacy Antibiotic Note  Hector NianRobert W [name] is a 46 y.o. male admitted on 04/17/2017 with aspiration pneumonia.  Pharmacy has been consulted for Unasyn dosing.  Plan:  Unasyn 3 gr IV q6h  Monitor clinical course, renal function, cultures as available    Height: 5\' 10"  (177.8 cm) Weight: 102 lb 8.2 oz (46.5 kg) IBW/kg (Calculated) : 73  Temp (24hrs), Avg:98.4 F (36.9 C), Min:97.8 F (36.6 C), Max:99.3 F (37.4 C)  Recent Labs  Lab 04/17/17 2224 04/18/17 0011  04/19/17 0534 04/20/17 0441 04/21/17 1039 04/22/17 0539 04/23/17 0602  WBC  --   --    < > 11.5* 13.6* 9.3 8.8 7.7  CREATININE  --   --    < > 1.43* 1.61* 1.57* 1.40* 1.14  LATICACIDVEN 2.60* 1.04  --   --   --   --   --   --    < > = values in this interval not displayed.    Estimated Creatinine Clearance: 52.1 mL/min (by C-G formula based on SCr of 1.14 mg/dL).    Allergies  Allergen Reactions  . Ativan [Lorazepam] Other (See Comments)    Pt has paradoxical effect with sedating medications.   . Lactulose Hives    Antimicrobials this admission: 12/29 Unasyn >>    Dose adjustments this admission: --  Microbiology results: ---  Thank you for allowing pharmacy to be a part of this patient's care.  Adalberto ColeNikola Nyasiah Moffet, PharmD, BCPS Pager [phone] 04/23/2017 4:41 PM

## 2017-04-24 LAB — OSMOLALITY, URINE: OSMOLALITY UR: 435 mosm/kg (ref 300–900)

## 2017-04-24 LAB — CBC
HCT: 31.7 % — ABNORMAL LOW (ref 39.0–52.0)
HEMOGLOBIN: 10.3 g/dL — AB (ref 13.0–17.0)
MCH: 32.5 pg (ref 26.0–34.0)
MCHC: 32.5 g/dL (ref 30.0–36.0)
MCV: 100 fL (ref 78.0–100.0)
Platelets: 143 10*3/uL — ABNORMAL LOW (ref 150–400)
RBC: 3.17 MIL/uL — AB (ref 4.22–5.81)
RDW: 13.1 % (ref 11.5–15.5)
WBC: 9.8 10*3/uL (ref 4.0–10.5)

## 2017-04-24 LAB — BASIC METABOLIC PANEL
Anion gap: 8 (ref 5–15)
BUN: 26 mg/dL — AB (ref 6–20)
CHLORIDE: 122 mmol/L — AB (ref 101–111)
CO2: 21 mmol/L — ABNORMAL LOW (ref 22–32)
Calcium: 9 mg/dL (ref 8.9–10.3)
Creatinine, Ser: 1.34 mg/dL — ABNORMAL HIGH (ref 0.61–1.24)
GFR calc Af Amer: >60 mL/min (ref >60)
GFR calc non Af Amer: >60 mL/min (ref >60)
GLUCOSE: 115 mg/dL — AB (ref 65–99)
POTASSIUM: 4.3 mmol/L (ref 3.5–5.1)
Sodium: 151 mmol/L — ABNORMAL HIGH (ref 135–145)

## 2017-04-24 LAB — SODIUM, URINE, RANDOM: Sodium, Ur: 42 mmol/L

## 2017-04-24 MED ORDER — RIFAXIMIN 200 MG PO TABS
400.0000 mg | ORAL_TABLET | Freq: Two times a day (BID) | ORAL | Status: DC
  Administered 2017-04-24 – 2017-04-29 (×10): 400 mg via ORAL
  Filled 2017-04-24 (×10): qty 2

## 2017-04-24 NOTE — Progress Notes (Addendum)
LCSW consulted: Disposition and possible placement (SNF)  LCSW called patient's father and chart was reviewed. LCSW left message for father to call back as he was unable to be reached. Awaiting call back to discuss disposition and needs.    Will follow up.    11:01 AM LCSW received call back from patient's father. Explained consult and father is interested in SNF, however once educated on how Medicaid and payor source works, father reports he is unable give up Medicaid check and does not want him to stay for 30 days.  LCSW voiced understanding and explained the difference between Medicaid and medicare payors. LCSW offered resources for home health and possibity of 1-2 visits if CM was able to locate agency, however father reports that was not helpful last time,thus declined.  Plan: DC back home with father. Father not interested in SNF placement.   Hector EmoryHannah Sandralee Tarkington LCSW, MSW Clinical Social Work: Optician, dispensingystem [employer] Coverage for :  [phone]

## 2017-04-24 NOTE — Progress Notes (Signed)
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 Neurology was consulted. Psychiatry was also consulted.      Assessment & Plan:  Principal Problem:   Coarse tremors Active Problems:   Alcoholic cirrhosis of liver without ascites (HCC)   Seizure (HCC)   ARF (acute renal failure) (HCC)   Tremor The best understanding I can get from father/primary caregiver report and from previous notes is that the patient was having clonic jerking episodes that were progrssively worse and more frequent and that interfered with ADLs as well as ataxia.    He was evaluated by neurology, who found he had a normal brain MRI (under anesthesia), and normal EEG.  They recommended decreasing doses of bromocriptine, Lexapro, Seroquel, and discontinuing Linzess, as all of these could contribute to tremor.  If needed, all but Linzess could be restarted gradually, and one at a time.  Ultimately, if not from medications, it was suspected that the patient had had some dehydration or a viral syndrome that caused some decompensation of his baseline aphasia, ataxia and tremor in the setting of polypharmacy.  He was evaluated by PT and recommended to go to SNF for rehab. -Linzess stopped -Seroquel stopped -Continue lower dose Lexapro -PT eval  Cirrhosis The patient has a long history of cirrhosis, follows with Dr. Marcelene ButteHammie from [facility].  He is intolerant or allergic to lactulose.  on admission he was noted to have high ammonia.  Clinically hepatic encephalopathy was obviously hard to discern given his baseline cognitive function.  I discussed treatment of hepatic  encephalopathy with [facility], Dr. Aline AugustHolmes, who felt a suboptimal alternative that might be suitable would be starting rifaximin and titrating MiraLAX to have 2-3 soft BM per day. -Continue MiraLAX and titrate to 2-3 BM per day -Start rifaximin given only 1 BM today on Miralax BID  Aspiration pneumonia Event yesterday where patient panicked, vomited, appeared very dyspneic, was hypoxic by ABG.  Chest x-ray obtained and showed new Right sided opacities.  Very difficult to tell what happened due to patient aphasia.   -Continue Unasyn, plan for 5 day course, ending Jan 3  Hypokalemia Mild -Supplement K  Hypernatremia Worse again, chronic. -Encourage free water -Check urine studies -BMP in AM  Seizure disorder No seizures in the hospital -Continue Vimpat, Topamax, Trileptal  Macrocytic anemia Stable  Thrombocytopenia Mild, unclear etiology  Depression -Decrease Lexapro dose, stopped Seroquel  Other medicaitons -Continue clonazepam -Continue bromocriptine -Continue amantadine -Continue PPI    Code Status: FULL Family Communication: Father Disposition Plan:  Family have declined SNF.  If able to tolerate Miralax and Rifaximin, likely home Monday with HHPT.   Consultants:   Neurology  Psychiatry  Procedures:   MRI brain  EEG  Antimicrobials:   None   Subjective: A phasic, only able to respond in 1 word answers.  Seems to be alert to my presence.     Objective: Vitals:   04/23/17 1300 04/23/17 2212 04/24/17 0558 04/24/17 1506  BP: (!) 112/55 (!) 101/55 102/62 97/69  Pulse: 81 90 84 91  Resp: (!) 26 (!) 23 (!) 28 (!) 25  Temp: 99.3 F (37.4 C) 98.3 F (36.8  C) 98 F (36.7 C) 99.9 F (37.7 C)  TempSrc: Oral Axillary Oral Oral  SpO2: 94% 96% 97% 92%  Weight:      Height:        Intake/Output Summary (Last 24 hours) at 04/24/2017 2027 Last data filed at 04/24/2017 1744 Gross per 24 hour  Intake 200 ml  Output 1300 ml  Net -1100 ml   Filed  Weights   04/20/17 1618 04/21/17 0511 04/23/17 0659  Weight: 44.9 kg (99 lb) 47.9 kg (105 lb 9.6 oz) 46.5 kg (102 lb 8.2 oz)    Examination: General appearance: Thin adult male, awake but aphasic.  In mitts.   HEENT: Anicteric, conjunctiva pink, lids and lashes normal. No nasal deformity, discharge, epistaxis.  Lips dry and dry OP membranes.  Old trach scar. Skin: Warm and dry.  No jaundice.  No suspicious rashes or lesions. Cardiac: RRR, nl S1-S2, no murmurs appreciated.  Capillary refill is brisk.  JVP normal  No LE edema.  Radial pulses 2+ and symmetric. Respiratory: Normal respiratory rate and rhythm.  CTAB without rales or wheezes. Abdomen: Abdomen soft.  Voluntary guarding, no TTP. No ascites, distension, hepatosplenomegaly.   MSK: No deformities or effusions. Neuro: Awake and alert to me, but not able to answer any questions, states "Research scientist (physical sciences)da da da" only.  Seems t moves all extremities, I do not note a right weakness, although he doesn not follow commands consistently. Psych: Unable to assess.    Data Reviewed: I have personally reviewed following labs and imaging studies:  CBC: Recent Labs  Lab 04/17/17 2211 04/18/17 0745 04/19/17 0534 04/20/17 0441 04/21/17 1039 04/22/17 0539 04/23/17 0602 04/24/17 0553  WBC 9.3 8.1 11.5* 13.6* 9.3 8.8 7.7 9.8  NEUTROABS 7.3 5.5 9.0* 11.0*  --  6.2  --   --   HGB 11.0* 11.1* 10.6* 10.7* 10.7* 10.1* 10.2* 10.3*  HCT 33.8* 33.4* 32.6* 33.1* 32.8* 30.9* 31.3* 31.7*  MCV 101.5* 102.1* 101.9* 101.5* 101.9* 101.3* 99.1 100.0  PLT 113* 109* 103* 113* 126* 125* 123* 143*   Basic Metabolic Panel: Recent Labs  Lab 04/17/17 2211  04/19/17 0534 04/20/17 0441 04/21/17 1039 04/22/17 0539 04/23/17 0602 04/24/17 0553  NA 144   < > 145 152* 149* 148* 144 151*  K 4.1   < > 4.0 3.8 3.4* 3.6 3.3* 4.3  CL 115*   < > 116* 121* 118* 121* 117* 122*  CO2 22   < > 24 23 22  21* 21* 21*  GLUCOSE 104*   < > 120* 116* 99 108* 109* 115*  BUN 29*   < > 36*  42* 42* 34* 28* 26*  CREATININE 1.54*   < > 1.43* 1.61* 1.57* 1.40* 1.14 1.34*  CALCIUM 9.1   < > 9.4 9.6 9.1 9.1 9.0 9.0  MG 2.2  --  2.3 2.4 2.6* 2.2  --   --    < > = values in this interval not displayed.   GFR: Estimated Creatinine Clearance: 44.3 mL/min (A) (by C-G formula based on SCr of 1.34 mg/dL (H)). Liver Function Tests: Recent Labs  Lab 04/17/17 2211 04/18/17 0745 04/19/17 0534 04/20/17 0441  AST 31 39 35 58*  ALT 20 21 24  38  ALKPHOS 101 113 121 127*  BILITOT 0.6 1.0 1.0 0.7  PROT 7.0 7.4 7.2 7.7  ALBUMIN 3.9 4.2 4.0 4.1   Recent Labs  Lab 04/17/17 2211  LIPASE 29   Recent Labs  Lab 04/18/17 0225  AMMONIA 41*  Coagulation Profile: No results for input(s): INR, PROTIME in the last 168 hours. Cardiac Enzymes: Recent Labs  Lab 04/17/17 2224  CKTOTAL 121   BNP (last 3 results) No results for input(s): PROBNP in the last 8760 hours. HbA1C: No results for input(s): HGBA1C in the last 72 hours. CBG: Recent Labs  Lab 04/17/17 2133  GLUCAP 104*   Lipid Profile: No results for input(s): CHOL, HDL, LDLCALC, TRIG, CHOLHDL, LDLDIRECT in the last 72 hours. Thyroid Function Tests: No results for input(s): TSH, T4TOTAL, FREET4, T3FREE, THYROIDAB in the last 72 hours. Anemia Panel: No results for input(s): VITAMINB12, FOLATE, FERRITIN, TIBC, IRON, RETICCTPCT in the last 72 hours. Urine analysis:    Component Value Date/Time   COLORURINE YELLOW 04/17/2017 2211   APPEARANCEUR CLEAR 04/17/2017 2211   LABSPEC 1.013 04/17/2017 2211   PHURINE 7.0 04/17/2017 2211   GLUCOSEU NEGATIVE 04/17/2017 2211   HGBUR NEGATIVE 04/17/2017 2211   BILIRUBINUR NEGATIVE 04/17/2017 2211   KETONESUR NEGATIVE 04/17/2017 2211   PROTEINUR NEGATIVE 04/17/2017 2211   UROBILINOGEN 1.0 02/12/2015 1912   NITRITE NEGATIVE 04/17/2017 2211   LEUKOCYTESUR NEGATIVE 04/17/2017 2211   Sepsis Labs: @LABRCNTIP (procalcitonin:4,lacticacidven:4)  ) Recent Results (from the past 240  hour(s))  Urine culture     Status: None   Collection Time: 04/17/17 10:11 PM  Result Value Ref Range Status   Specimen Description URINE, CLEAN CATCH  Final   Special Requests NONE  Final   Culture   Final    NO GROWTH Performed at [facility], [address] [place], [place] [address]    Report Status 04/19/2017 FINAL  Final         Radiology Studies: Dg Chest Port 1 View  Result Date: 04/23/2017 CLINICAL DATA:  Dyspnea. EXAM: PORTABLE CHEST 1 VIEW COMPARISON:  04/17/2017 FINDINGS: Normal heart size. No pleural effusion or edema. Interval development of multifocal airspace densities within the right upper and lower lung zones. Left lung appears clear. IMPRESSION: 1. Right lung airspace opacities compatible with pneumonia. Electronically Signed   By: Signa Kell M.D.   On: 04/23/2017 16:15        Scheduled Meds: . amantadine  100 mg Oral BID  . bromocriptine  5 mg Oral BID  . clonazePAM  1 mg Oral QHS  . escitalopram  5 mg Oral Daily  . feeding supplement (ENSURE ENLIVE)  237 mL Oral TID BM  . lacosamide  50 mg Oral BID  . oxcarbazepine  600 mg Oral BID  . pantoprazole  40 mg Oral Daily  . polyethylene glycol  17 g Oral BID  . rifaximin  400 mg Oral BID  . topiramate  100 mg Oral BID   Continuous Infusions: . ampicillin-sulbactam (UNASYN) IV Stopped (04/24/17 1823)  . lactated ringers 10 mL/hr at 04/20/17 1713     LOS: 6 days    Time spent: 15 minutes    Alberteen Sam, MD [facility] 04/24/2017, 12:15 PM     Pager [phone] --- please page though AMION:  [email or web address] Password [email or web address] If 7PM-7AM, please contact night-coverage

## 2017-04-25 ENCOUNTER — Inpatient Hospital Stay (HOSPITAL_COMMUNITY): Payer: Medicaid Other

## 2017-04-25 DIAGNOSIS — E870 Hyperosmolality and hypernatremia: Secondary | ICD-10-CM

## 2017-04-25 DIAGNOSIS — J690 Pneumonitis due to inhalation of food and vomit: Secondary | ICD-10-CM

## 2017-04-25 DIAGNOSIS — J9621 Acute and chronic respiratory failure with hypoxia: Secondary | ICD-10-CM

## 2017-04-25 LAB — BASIC METABOLIC PANEL
Anion gap: 7 (ref 5–15)
BUN: 27 mg/dL — AB (ref 6–20)
CHLORIDE: 124 mmol/L — AB (ref 101–111)
CO2: 22 mmol/L (ref 22–32)
Calcium: 9.3 mg/dL (ref 8.9–10.3)
Creatinine, Ser: 1.18 mg/dL (ref 0.61–1.24)
GFR calc Af Amer: >60 mL/min (ref >60)
GFR calc non Af Amer: >60 mL/min (ref >60)
GLUCOSE: 131 mg/dL — AB (ref 65–99)
POTASSIUM: 3.8 mmol/L (ref 3.5–5.1)
Sodium: 153 mmol/L — ABNORMAL HIGH (ref 135–145)

## 2017-04-25 LAB — CBC
HEMATOCRIT: 33.7 % — AB (ref 39.0–52.0)
Hemoglobin: 10.9 g/dL — ABNORMAL LOW (ref 13.0–17.0)
MCH: 32.4 pg (ref 26.0–34.0)
MCHC: 32.3 g/dL (ref 30.0–36.0)
MCV: 100.3 fL — AB (ref 78.0–100.0)
Platelets: 157 10*3/uL (ref 150–400)
RBC: 3.36 MIL/uL — ABNORMAL LOW (ref 4.22–5.81)
RDW: 13.6 % (ref 11.5–15.5)
WBC: 9.2 10*3/uL (ref 4.0–10.5)

## 2017-04-25 LAB — AMMONIA: AMMONIA: 28 umol/L (ref 9–35)

## 2017-04-25 MED ORDER — DEXTROSE 5 % IV SOLN
INTRAVENOUS | Status: DC
  Administered 2017-04-25 – 2017-04-26 (×2): via INTRAVENOUS

## 2017-04-25 NOTE — Progress Notes (Signed)
Rechecked pts o2 saturation and found to be 80%; increased O2 to 3L/Min via [place] with O2 sats raising to 98%. Alerted pts nurse/charge nurse.

## 2017-04-25 NOTE — Progress Notes (Addendum)
Physical Therapy Treatment Patient Details Name: Hector NianRobert W [name] MRN: [name] DOB: 01-May-1970 Today's Date: 04/25/2017    History of Present Illness 46 year old male with history of traumatic brain injury, seizure disorder, question of alcohol liver cirrhosis presented with worsening tremors    PT Comments    Assisted OOB to amb to bathroom with very unsteady gait gait noted increased RR rate 38 and WOB.  Trial RA decreased to 73% so applied 2 lts with rest and MAX VC's to slow breathing (anxiety) while sitting on toilet.  Unable to walk pt in hallway as prior did 50 feet due to respiratory distress and WOB.  Returned to bed and monitored until RR decreased to normal and )2 sats returned to 90's on 2 lts.  Reported to RN.    Follow Up Recommendations  SNF(per chart review family plans to take py back home)     Equipment Recommendations  None recommended by PT    Recommendations for Other Services       Precautions / Restrictions Precautions Precautions: Fall Precaution Comments: ataxia Restrictions Weight Bearing Restrictions: No    Mobility  Bed Mobility Overal bed mobility: Needs Assistance Bed Mobility: Supine to Sit;Sit to Supine     Supine to sit: Min guard Sit to supine: Min guard   General bed mobility comments: patient is ballistic with his movements, cooperative for sitting up, flailed self back into bed.       Transfers Overall transfer level: Needs assistance Equipment used: Rolling walker (2 wheeled);None Transfers: Sit to/from UGI CorporationStand;Stand Pivot Transfers Sit to Stand: Mod assist         General transfer comment: steady assist as patient is ballistic and ataxic in all extremities and trunk  Ambulation/Gait   Ambulation Distance (Feet): 16 Feet(8 feet x 2 to and from bathroom only) Assistive device: Rolling walker (2 wheeled) Gait Pattern/deviations: Step-to pattern;Ataxic;Staggering right;Staggering left;Trunk flexed Gait velocity: VC's to  decrease speed to attempt to increase control   General Gait Details: noted left leg is  less controlled and adducts and inverts at the ankle.Marland Kitchen. Requires much assistance for stability and for guiding the RW.  HIGH FALL RISK   Stairs            Wheelchair Mobility    Modified Rankin (Stroke Patients Only)       Balance                                            Cognition Arousal/Alertness: Awake/alert Behavior During Therapy: WFL for tasks assessed/performed Overall Cognitive Status: Difficult to assess                                 General Comments: following commands but unable to appropriately express needs (words finding)      Exercises      General Comments        Pertinent Vitals/Pain Pain Assessment: No/denies pain    Home Living                      Prior Function            PT Goals (current goals can now be found in the care plan section) Progress towards PT goals: Progressing toward goals    Frequency    Min 2X/week  PT Plan Discharge plan needs to be updated;Current plan remains appropriate    Co-evaluation              AM-PAC PT "6 Clicks" Daily Activity  Outcome Measure  Difficulty turning over in bed (including adjusting bedclothes, sheets and blankets)?: A Lot Difficulty moving from lying on back to sitting on the side of the bed? : A Lot Difficulty sitting down on and standing up from a chair with arms (e.g., wheelchair, bedside commode, etc,.)?: Unable Help needed moving to and from a bed to chair (including a wheelchair)?: Total Help needed walking in hospital room?: Total Help needed climbing 3-5 steps with a railing? : Total 6 Click Score: 8    End of Session Equipment Utilized During Treatment: Gait belt Activity Tolerance: Patient tolerated treatment well Patient left: in bed;with call bell/phone within reach;with family/visitor present;with nursing/sitter in  room Nurse Communication: Mobility status PT Visit Diagnosis: Difficulty in walking, not elsewhere classified (R26.2)     Time: [id number] PT Time Calculation (min) (ACUTE ONLY): 28 min  Charges:  $Gait Training: 8-22 mins $Therapeutic Activity: 8-22 mins                    G Codes:       Felecia ShellingLori Emidio Warrell  PTA [facility]  Acute  Rehab Pager      [phone]

## 2017-04-25 NOTE — Progress Notes (Addendum)
[facility] PROGRESS NOTE  Hector NianRobert W [name] [id number] DOB: 10/16/1970 DOA: 04/17/2017  PCP: [name], [name], MD  Brief History/Interval Summary: 46 year old Caucasian male with a past medical history of traumatic brain injury with dural hematoma and subsequent seizure disorder now with aphasia and residual right-sided weakness, alcoholic cirrhosis presented with worsening MRSA and clonic jerking.  Neurology and psychiatry were consulted.  Consultants: Neurology and psychiatry  Procedures: EEG  Antibiotics: None  Subjective/Interval History: Difficult to comprehend what the patient is trying to say.  ROS: Unable to do due to his aphasia  Objective:  Vital Signs  Vitals:   04/24/17 1506 04/24/17 2100 04/25/17 0543 04/25/17 1246  BP: 97/69 103/62 114/60   Pulse: 91 87 73 96  Resp: (!) 25 20 19    Temp: 99.9 F (37.7 C) 97.6 F (36.4 C) 97.9 F (36.6 C)   TempSrc: Oral Oral Oral   SpO2: 92% 95% 100% (!) 73%  Weight:      Height:        Intake/Output Summary (Last 24 hours) at 04/25/2017 1450 Last data filed at 04/25/2017 1000 Gross per 24 hour  Intake 500 ml  Output 1050 ml  Net -550 ml   Filed Weights   04/20/17 1618 04/21/17 0511 04/23/17 0659  Weight: 44.9 kg (99 lb) 47.9 kg (105 lb 9.6 oz) 46.5 kg (102 lb 8.2 oz)    General appearance: alert, cooperative, appears stated age and no distress Head: Normocephalic, without obvious abnormality, atraumatic Resp: Diminished air entry at the bases.  Few crackles.  No wheezing.  No rhonchi.  Normal effort at rest. Cardio: regular rate and rhythm, S1, S2 normal, no murmur, click, rub or gallop GI: soft, non-tender; bowel sounds normal; no masses,  no organomegaly Extremities: extremities normal, atraumatic, no cyanosis or edema Neurologic: No new focal deficits except as known previously  Lab Results:  Data Reviewed: I have personally reviewed following labs and imaging studies  CBC: Recent Labs    Lab 04/19/17 0534 04/20/17 0441 04/21/17 1039 04/22/17 0539 04/23/17 0602 04/24/17 0553 04/25/17 0614  WBC 11.5* 13.6* 9.3 8.8 7.7 9.8 9.2  NEUTROABS 9.0* 11.0*  --  6.2  --   --   --   HGB 10.6* 10.7* 10.7* 10.1* 10.2* 10.3* 10.9*  HCT 32.6* 33.1* 32.8* 30.9* 31.3* 31.7* 33.7*  MCV 101.9* 101.5* 101.9* 101.3* 99.1 100.0 100.3*  PLT 103* 113* 126* 125* 123* 143* 157    Basic Metabolic Panel: Recent Labs  Lab 04/19/17 0534 04/20/17 0441 04/21/17 1039 04/22/17 0539 04/23/17 0602 04/24/17 0553 04/25/17 0614  NA 145 152* 149* 148* 144 151* 153*  K 4.0 3.8 3.4* 3.6 3.3* 4.3 3.8  CL 116* 121* 118* 121* 117* 122* 124*  CO2 24 23 22  21* 21* 21* 22  GLUCOSE 120* 116* 99 108* 109* 115* 131*  BUN 36* 42* 42* 34* 28* 26* 27*  CREATININE 1.43* 1.61* 1.57* 1.40* 1.14 1.34* 1.18  CALCIUM 9.4 9.6 9.1 9.1 9.0 9.0 9.3  MG 2.3 2.4 2.6* 2.2  --   --   --     GFR: Estimated Creatinine Clearance: 50.4 mL/min (by C-G formula based on SCr of 1.18 mg/dL).  Liver Function Tests: Recent Labs  Lab 04/19/17 0534 04/20/17 0441  AST 35 58*  ALT 24 38  ALKPHOS 121 127*  BILITOT 1.0 0.7  PROT 7.2 7.7  ALBUMIN 4.0 4.1     Recent Labs  Lab 04/25/17 0614  AMMONIA 28  Recent Results (from the past 240 hour(s))  Urine culture     Status: None   Collection Time: 04/17/17 10:11 PM  Result Value Ref Range Status   Specimen Description URINE, CLEAN CATCH  Final   Special Requests NONE  Final   Culture   Final    NO GROWTH Performed at [facility], [address] [place], [place] [address]    Report Status 04/19/2017 FINAL  Final      Radiology Studies: Dg Chest Port 1 View  Result Date: 04/23/2017 CLINICAL DATA:  Dyspnea. EXAM: PORTABLE CHEST 1 VIEW COMPARISON:  04/17/2017 FINDINGS: Normal heart size. No pleural effusion or edema. Interval development of multifocal airspace densities within the right upper and lower lung zones. Left lung appears clear. IMPRESSION: 1.  Right lung airspace opacities compatible with pneumonia. Electronically Signed   By: Signa Kellaylor  Stroud M.D.   On: 04/23/2017 16:15     Medications:  Scheduled: . amantadine  100 mg Oral BID  . bromocriptine  5 mg Oral BID  . clonazePAM  1 mg Oral QHS  . escitalopram  5 mg Oral Daily  . feeding supplement (ENSURE ENLIVE)  237 mL Oral TID BM  . lacosamide  50 mg Oral BID  . oxcarbazepine  600 mg Oral BID  . pantoprazole  40 mg Oral Daily  . polyethylene glycol  17 g Oral BID  . rifaximin  400 mg Oral BID  . topiramate  100 mg Oral BID   Continuous: . ampicillin-sulbactam (UNASYN) IV Stopped (04/25/17 1338)  . dextrose 50 mL/hr at 04/25/17 1055   YQM:[id number]:acetaminophen **OR** acetaminophen, ondansetron **OR** ondansetron (ZOFRAN) IV  Assessment/Plan:  Principal Problem:   Coarse tremors Active Problems:   Alcoholic cirrhosis of liver without ascites (HCC)   Seizure (HCC)   ARF (acute renal failure) (HCC)    Aspiration pneumonia with acute hypoxic respiratory failure Patient has had 2 episodes where it appears that he aspirated while eating.  Patient is on Unasyn currently.  Had another episode of hypoxia this morning.  We will repeat chest x-ray.  Continue oxygen.  Monitor closely.  Tremors He was evaluated by neurology.  Underwent brain MRI which did not show any acute process.  Left-sided encephalomalacia was noted.  EEG was also unremarkable.  Neurology recommended decreasing doses of bromocriptine, Lexapro, Seroquel, and discontinuing Linzess, as all of these could contribute to tremor.  If needed, all but Linzess could be restarted gradually, and one at a time.  Symptoms could also have been caused by viral syndrome.  Seems to have improved.  Liver cirrhosis The patient has a long history of cirrhosis, follows with Dr. Marcelene ButteHammie from [facility].  He is intolerant or allergic to lactulose. On admission he was noted to have high ammonia.  Clinically hepatic encephalopathy was obviously  hard to discern given his baseline cognitive function. This was discussed with [facility], Dr. Aline AugustHolmes, who felt a suboptimal alternative that might be suitable would be starting rifaximin and titrating MiraLAX to have 2-3 soft BM per day.  Patient was started on MiraLAX and rifaximin.  Hypernatremia Due to poor free water intake.  Sodium level has climbed again today.  He has been started on D5 infusion.  Nursing to encourage patient to consume water.  However in view of aspiration we will first have him evaluated by speech therapy.  Hypokalemia Repleted  Seizure disorder No seizures in the hospital. Continue Vimpat, Topamax, Trileptal.  Macrocytic anemia Stable.  No evidence for overt  bleeding.  Thrombocytopenia Etiology unclear.  Counts have improved.  Depression Decrease Lexapro dose, stopped Seroquel  DVT Prophylaxis: SCDs    Code Status: Full code Family Communication: No family at bedside Disposition Plan: Physical therapy recommends skilled nursing facility.  Family apparently wants to take him home. Patient is not ready for discharge.     LOS: 7 days   Osvaldo Shipper  [facility] Pager [phone] 04/25/2017, 2:50 PM  If 7PM-7AM, please contact night-coverage at [email or web address], password [facility]

## 2017-04-25 NOTE — Progress Notes (Signed)
Order for swallow evaluation received, given h/o sensorimotor deficits with his dysphagia and aspiration - recommend to proceed with MBS on 04/27/2017.   Hector Burnetamara Yareli Carthen, MS [facility] SLP [phone]

## 2017-04-26 DIAGNOSIS — R0902 Hypoxemia: Secondary | ICD-10-CM

## 2017-04-26 LAB — BASIC METABOLIC PANEL
Anion gap: 6 (ref 5–15)
BUN: 22 mg/dL — ABNORMAL HIGH (ref 6–20)
CALCIUM: 8.9 mg/dL (ref 8.9–10.3)
CO2: 21 mmol/L — ABNORMAL LOW (ref 22–32)
CREATININE: 1.23 mg/dL (ref 0.61–1.24)
Chloride: 119 mmol/L — ABNORMAL HIGH (ref 101–111)
Glucose, Bld: 107 mg/dL — ABNORMAL HIGH (ref 65–99)
Potassium: 3.4 mmol/L — ABNORMAL LOW (ref 3.5–5.1)
SODIUM: 146 mmol/L — AB (ref 135–145)

## 2017-04-26 LAB — CBC
HCT: 30.5 % — ABNORMAL LOW (ref 39.0–52.0)
Hemoglobin: 10 g/dL — ABNORMAL LOW (ref 13.0–17.0)
MCH: 32.5 pg (ref 26.0–34.0)
MCHC: 32.8 g/dL (ref 30.0–36.0)
MCV: 99 fL (ref 78.0–100.0)
PLATELETS: 161 10*3/uL (ref 150–400)
RBC: 3.08 MIL/uL — ABNORMAL LOW (ref 4.22–5.81)
RDW: 13.6 % (ref 11.5–15.5)
WBC: 9.3 10*3/uL (ref 4.0–10.5)

## 2017-04-26 MED ORDER — POTASSIUM CHLORIDE 10 MEQ/100ML IV SOLN
10.0000 meq | INTRAVENOUS | Status: AC
  Administered 2017-04-26 (×2): 10 meq via INTRAVENOUS
  Filled 2017-04-26 (×2): qty 100

## 2017-04-26 MED ORDER — POTASSIUM CHLORIDE 10 MEQ/100ML IV SOLN
10.0000 meq | INTRAVENOUS | Status: AC
  Administered 2017-04-26 (×2): 10 meq via INTRAVENOUS
  Filled 2017-04-26 (×4): qty 100

## 2017-04-26 NOTE — Progress Notes (Signed)
Pharmacy Antibiotic Note  Hector [name] is a 47 y.o. male admitted on 04/17/2017 with aspiration pneumonia.  Pharmacy has been consulted for Unasyn dosing.  04/26/2017 Scr 1.23, CrCl ~ 6753mls/min WBC WNL afebrile  Plan:  Continue Unasyn 3 gr IV q6h  Monitor clinical course, renal function, cultures as available    Height: 5\' 10"  (177.8 cm) Weight: 112 lb 3.4 oz (50.9 kg) IBW/kg (Calculated) : 73  Temp (24hrs), Avg:98.5 F (36.9 C), Min:98.1 F (36.7 C), Max:98.7 F (37.1 C)  Recent Labs  Lab 04/22/17 0539 04/23/17 0602 04/24/17 0553 04/25/17 0614 04/26/17 0604  WBC 8.8 7.7 9.8 9.2 9.3  CREATININE 1.40* 1.14 1.34* 1.18 1.23    Estimated Creatinine Clearance: 52.9 mL/min (by C-G formula based on SCr of 1.23 mg/dL).    Allergies  Allergen Reactions  . Ativan [Lorazepam] Other (See Comments)    Pt has paradoxical effect with sedating medications.   . Lactulose Hives    Antimicrobials this admission: 12/29 Unasyn >>    Dose adjustments this admission: --  Microbiology results: ---  Thank you for allowing pharmacy to be a part of this patient's care.  Arley PhenixEllen Fernandez Kenley RPh 04/26/2017, 10:50 AM Pager [phone]

## 2017-04-26 NOTE — Progress Notes (Addendum)
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  Neurology and psychiatry were consulted.  Consultants: Neurology and psychiatry  Procedures: EEG  Antibiotics: None  Subjective/Interval History: Difficult to comprehend what the patient is trying to say.  ROS: Unable to do due to his aphasia  Objective:  Vital Signs  Vitals:   04/25/17 1400 04/25/17 1533 04/25/17 2125 04/26/17 0607  BP:  115/64 (!) 110/54 117/64  Pulse:  79 79 71  Resp:   18 18  Temp:  98.7 F (37.1 C) 98.1 F (36.7 C) 98.7 F (37.1 C)  TempSrc:  Oral Axillary Oral  SpO2: 98% 98% 97% 98%  Weight:    50.9 kg (112 lb 3.4 oz)  Height:        Intake/Output Summary (Last 24 hours) at 04/26/2017 1014 Last data filed at 04/26/2017 0928 Gross per 24 hour  Intake 1925 ml  Output 100 ml  Net 1825 ml   Filed Weights   04/21/17 0511 04/23/17 0659 04/26/17 0607  Weight: 47.9 kg (105 lb 9.6 oz) 46.5 kg (102 lb 8.2 oz) 50.9 kg (112 lb 3.4 oz)    General appearance: Awake alert.  Does not appear to be in any distress. Resp: Diminished air entry at bases.  Few crackles.  No wheezing. Cardio: S1-S2 normal regular.  No S3-S4.  No rubs murmurs of bruit GI: Abdomen is soft.  Nontender nondistended.  Bowel sounds are present.  No masses organomegaly Extremities: No edema Neurologic: No new focal deficits except as known previously  Lab Results:  Data Reviewed: I have personally reviewed following labs and imaging studies  CBC: Recent Labs  Lab 04/20/17 0441  04/22/17 0539 04/23/17 0602 04/24/17 0553 04/25/17 0614 04/26/17 0604  WBC 13.6*   <  > 8.8 7.7 9.8 9.2 9.3  NEUTROABS 11.0*  --  6.2  --   --   --   --   HGB 10.7*   < > 10.1* 10.2* 10.3* 10.9* 10.0*  HCT 33.1*   < > 30.9* 31.3* 31.7* 33.7* 30.5*  MCV 101.5*   < > 101.3* 99.1 100.0 100.3* 99.0  PLT 113*   < > 125* 123* 143* 157 161   < > = values in this interval not displayed.    Basic Metabolic Panel: Recent Labs  Lab 04/20/17 0441 04/21/17 1039 04/22/17 0539 04/23/17 0602 04/24/17 0553 04/25/17 0614 04/26/17 0604  NA 152* 149* 148* 144 151* 153* 146*  K 3.8 3.4* 3.6 3.3* 4.3 3.8 3.4*  CL 121* 118* 121* 117* 122* 124* 119*  CO2 23 22 21* 21* 21* 22 21*  GLUCOSE 116* 99 108* 109* 115* 131* 107*  BUN 42* 42* 34* 28* 26* 27* 22*  CREATININE 1.61* 1.57* 1.40* 1.14 1.34* 1.18 1.23  CALCIUM 9.6 9.1 9.1 9.0 9.0 9.3 8.9  MG 2.4 2.6* 2.2  --   --   --   --     GFR: Estimated Creatinine Clearance: 52.9 mL/min (by C-G formula based on SCr of 1.23 mg/dL).  Liver Function Tests: Recent Labs  Lab 04/20/17 0441  AST 58*  ALT 38  ALKPHOS 127*  BILITOT 0.7  PROT 7.7  ALBUMIN 4.1  Recent Labs  Lab 04/25/17 0614  AMMONIA 28     Recent Results (from the past 240 hour(s))  Urine culture     Status: None   Collection Time: 04/17/17 10:11 PM  Result Value Ref Range Status   Specimen Description URINE, CLEAN CATCH  Final   Special Requests NONE  Final   Culture   Final    NO GROWTH Performed at [facility], [address], [place], [place] [address]    Report Status 04/19/2017 FINAL  Final      Radiology Studies: Dg Chest Port 1 View  Result Date: 04/25/2017 CLINICAL DATA:  Hypoxia EXAM: PORTABLE CHEST 1 VIEW COMPARISON:  04/23/2017 FINDINGS: Cardiac shadow is stable. The lungs are well aerated bilaterally. Patchy infiltrates are again noted within the right lung stable from the prior study. No acute bony abnormality is seen. IMPRESSION: Stable right-sided infiltrates.  The Electronically Signed   By: Alcide Clever M.D.   On: 04/25/2017 15:20      Medications:  Scheduled: . amantadine  100 mg Oral BID  . bromocriptine  5 mg Oral BID  . clonazePAM  1 mg Oral QHS  . escitalopram  5 mg Oral Daily  . feeding supplement (ENSURE ENLIVE)  237 mL Oral TID BM  . lacosamide  50 mg Oral BID  . oxcarbazepine  600 mg Oral BID  . pantoprazole  40 mg Oral Daily  . polyethylene glycol  17 g Oral BID  . rifaximin  400 mg Oral BID  . topiramate  100 mg Oral BID   Continuous: . ampicillin-sulbactam (UNASYN) IV 3 g (04/26/17 0510)  . dextrose 50 mL/hr at 04/25/17 1055  . potassium chloride     UEA:[id number] **OR** acetaminophen, ondansetron **OR** ondansetron (ZOFRAN) IV  Assessment/Plan:  Principal Problem:   Coarse tremors Active Problems:   Alcoholic cirrhosis of liver without ascites (HCC)   Seizure (HCC)   ARF (acute renal failure) (HCC)    Aspiration pneumonia with acute hypoxic respiratory failure Patient has had 2 episodes where it appears that he aspirated while eating.  Patient is on Unasyn currently.  Had another episode of hypoxia on 12/31.  Chest x-ray showed stable findings.  Patient seen by speech therapy and plan is to do modified barium swallow tomorrow.    Tremors He was evaluated by neurology.  Underwent brain MRI which did not show any acute process.  Left-sided encephalomalacia was noted.  EEG was also unremarkable.  Neurology recommended decreasing doses of bromocriptine, Lexapro, Seroquel, and discontinuing Linzess, as all of these could contribute to tremor.  If needed, all but Linzess could be restarted gradually, and one at a time.  Symptoms could also have been caused by viral syndrome.  Seems to have improved.  Liver cirrhosis The patient has a long history of cirrhosis, follows with Dr. Marcelene Butte from [place].  He is intolerant or allergic to lactulose. On admission he was noted to have high ammonia.  Clinically hepatic encephalopathy was obviously hard to discern given his baseline cognitive function.  This was discussed with [facility], Dr. Aline August, who felt a suboptimal alternative that might be suitable would be starting rifaximin and titrating MiraLAX to have 2-3 soft BM per day.  Patient was started on MiraLAX and rifaximin.  Seems to be stable.  Hypernatremia Due to poor free water intake.  Sodium level increased over the last few days.  Patient was placed back on a D5 infusion.  Sodium is improved this morning.  Continue to encourage free water intake.  But we will also wait for his swallow evaluation to be completed.    Hypokalemia Replete.  Seizure disorder No seizures in the hospital. Continue Vimpat, Topamax, Trileptal.  Macrocytic anemia Stable.  No evidence for overt bleeding.  Thrombocytopenia Etiology unclear.  Counts have improved.  Depression Decrease Lexapro dose, stopped Seroquel  DVT Prophylaxis: SCDs    Code Status: Full code Family Communication: No family at bedside Disposition Plan: Physical therapy recommends skilled nursing facility.  Family apparently wants to take him home. Patient is not ready for discharge.     LOS: 8 days   Osvaldo ShipperGokul Mindi Akerson  [facility] Pager [phone] 04/26/2017, 10:14 AM  If 7PM-7AM, please contact night-coverage at [email or web address], password [email or web address]

## 2017-04-26 NOTE — Progress Notes (Signed)
Patient ate well yesterday but has not been interested in meals at all today. He has had a few drinks and an ensure.

## 2017-04-27 ENCOUNTER — Inpatient Hospital Stay (HOSPITAL_COMMUNITY): Payer: Medicaid Other

## 2017-04-27 LAB — CBC
HCT: 30.3 % — ABNORMAL LOW (ref 39.0–52.0)
HEMOGLOBIN: 10.2 g/dL — AB (ref 13.0–17.0)
MCH: 33.2 pg (ref 26.0–34.0)
MCHC: 33.7 g/dL (ref 30.0–36.0)
MCV: 98.7 fL (ref 78.0–100.0)
PLATELETS: 184 10*3/uL (ref 150–400)
RBC: 3.07 MIL/uL — AB (ref 4.22–5.81)
RDW: 13.2 % (ref 11.5–15.5)
WBC: 7.5 10*3/uL (ref 4.0–10.5)

## 2017-04-27 LAB — BASIC METABOLIC PANEL
Anion gap: 6 (ref 5–15)
BUN: 17 mg/dL (ref 6–20)
CHLORIDE: 115 mmol/L — AB (ref 101–111)
CO2: 20 mmol/L — ABNORMAL LOW (ref 22–32)
CREATININE: 1.14 mg/dL (ref 0.61–1.24)
Calcium: 8.7 mg/dL — ABNORMAL LOW (ref 8.9–10.3)
Glucose, Bld: 107 mg/dL — ABNORMAL HIGH (ref 65–99)
POTASSIUM: 4.2 mmol/L (ref 3.5–5.1)
Sodium: 141 mmol/L (ref 135–145)

## 2017-04-27 LAB — MAGNESIUM: Magnesium: 1.9 mg/dL (ref 1.7–2.4)

## 2017-04-27 MED ORDER — RESOURCE THICKENUP CLEAR PO POWD
ORAL | Status: DC | PRN
  Filled 2017-04-27: qty 125

## 2017-04-27 NOTE — Progress Notes (Signed)
Pt did not eat any meals on 04/26/17 despite frequent offers.  Pt did take PM meds with a few sips of Ensure or water but coughs more  after drinking water as opposed to thickened ( with miralax) ensure.  Pt was sitting upright & refusing to try taking meds with pudding or applesauce

## 2017-04-27 NOTE — Progress Notes (Signed)
SLP will conduct MBS today given pt h/o dysphagia and current concerns for respiratory issues.  Pt tentatively on schedule for 11 am today, RN informed.  Thanks. Donavan Burnetamara Rickelle Sylvestre, MS [facility] SLP [phone]

## 2017-04-27 NOTE — Progress Notes (Signed)
Modified Barium Swallow Progress Note  Patient Details  Name: Hector [name] MRN: [id number] Date of Birth: December 17, 1970  Today's Date: 04/27/2017  Modified Barium Swallow completed.  Full report located under Chart Review in the Imaging Section.  Brief recommendations include the following:  Clinical Impression  Pt presents with mild oral and moderate pharyngeal dysphagia nearly [facility]  He continues with SILENT +aspiration of secretions c/b wet voice that does not clear with cued cough/throat clear or dry swallow.  Pt takes large boluses - which contributes largely to his aspiration risk.  He demonstrates decreased oral control resulting in premature spillage of barium into pharynx uncontrolled.  No aspiration or deep laryngeal penetration of nectar, honey, pudding or cracker.  Pharyngeal residuals noted with both solids/liquids that mixes with secretions.  Cued dry swallows effectively decreased residuals.  Pt chronically cleared his throat throughout exam after initial swallow of barium - ? if due to sensate to secretion aspiration.   Pt with suspected aspiration of thin when taking barium tablet - at least deep penetration.   Following the exam, pt overtly coughing with thin water - much improved tolerance clinically of thicker water.   Using live video, SLP educated pt to findings and reinforced effective compensation strategies.       Swallow Evaluation Recommendations       SLP Diet Recommendations: Dysphagia 3 (Mech soft) solids;Nectar thick liquid;Other (Comment);Free water protocol after oral care(free water between meals after mouth care)   Liquid Administration via: Cup;Straw   Medication Administration: Whole meds with puree   Supervision: Patient able to self feed   Compensations: Slow rate;Small sips/bites;Multiple dry swallows after each bite/sip   Postural Changes: Remain semi-upright after after feeds/meals (Comment);Seated upright at 90  degrees   Oral Care Recommendations: Oral care BID      Hector Burnetamara Iden Stripling, MS [facility] SLP [phone]   Hector [name], Hector [name] 04/27/2017,12:26 PM

## 2017-04-27 NOTE — Progress Notes (Signed)
PT Cancellation Note  Patient Details Name: Hector NianRobert W [name] MRN: [id number] DOB: Jun 19, 1970   Cancelled Treatment:     MBSS in am then working with OT.  Will attempt tomorrow.   Felecia ShellingLori Trenna Kiely  PTA [facility]  Acute  Rehab Pager      [phone]

## 2017-04-27 NOTE — Progress Notes (Signed)
Occupational Therapy Treatment Patient Details Name: Hector NianRobert W [name] MRN: [id number] DOB: March 02, 1971 Today's Date: 04/27/2017    History of present illness 47 year old male with history of traumatic brain injury, seizure disorder, question of alcohol liver cirrhosis presented with worsening tremors   OT comments  Pt only agreeable to sitting EOB today. Performed UB exercises  Follow Up Recommendations  SNF;Supervision/Assistance - 24 hour(per chart, family plans to take pt home)    Equipment Recommendations  (? 3:1)    Recommendations for Other Services      Precautions / Restrictions Precautions Precautions: Fall Precaution Comments: ataxia Restrictions Weight Bearing Restrictions: No       Mobility Bed Mobility         Supine to sit: Min guard Sit to supine: Min guard   General bed mobility comments: for safety  Transfers                      Balance                                           ADL either performed or assessed with clinical judgement   ADL                                         General ADL Comments: pt sat EOB and performed UE exercises.  He did not want to stand/get up to chair today.  Pt now on modified diet after MBS (nectar thick)       Vision       Perception     Praxis      Cognition Arousal/Alertness: Awake/alert Behavior During Therapy: WFL for tasks assessed/performed Overall Cognitive Status: Difficult to assess                                 General Comments: follows commands        Exercises Exercises: Other exercises Other Exercises Other Exercises: 5 reps AROM FF, Abduction and elbow flexion   Shoulder Instructions       General Comments      Pertinent Vitals/ Pain       Pain Assessment: Faces Faces Pain Scale: Hurts little more Pain Location: mouth Pain Descriptors / Indicators: Grimacing Pain Intervention(s): Limited activity within patient's  tolerance;Monitored during session;Premedicated before session;Repositioned;Patient requesting pain meds-RN notified  Home Living                                          Prior Functioning/Environment              Frequency           Progress Toward Goals  OT Goals(current goals can now be found in the care plan section)  Progress towards OT goals: Progressing toward goals     Plan      Co-evaluation                 AM-PAC PT "6 Clicks" Daily Activity     Outcome Measure   Help from another person eating meals?: A Little Help from another person taking care of personal grooming?:  A Little Help from another person toileting, which includes using toliet, bedpan, or urinal?: A Lot Help from another person bathing (including washing, rinsing, drying)?: A Lot Help from another person to put on and taking off regular upper body clothing?: A Lot Help from another person to put on and taking off regular lower body clothing?: A Lot 6 Click Score: 14    End of Session        Activity Tolerance Patient tolerated treatment well   Patient Left in bed;with call bell/phone within reach   Nurse Communication          Time: 6962-9528 OT Time Calculation (min): 22 min  Charges: OT General Charges $OT Visit: 1 Visit OT Treatments $Self Care/Home Management : 8-22 mins  Marica Otter, OTR/L [phone] 04/27/2017   Hector [name] 04/27/2017, 12:43 PM

## 2017-04-27 NOTE — Progress Notes (Addendum)
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  Neurology and psychiatry were consulted.  She was improving but then aspirated.  Swallow evaluation has been ordered.  Patient started on IV antibiotics.    Consultants: Neurology and psychiatry  Procedures: EEG  Antibiotics: Unasyn  Subjective/Interval History: Difficult to comprehend what the patient is stating.  He does not appear to be in any discomfort.  ROS: Unable to do due to his aphasia  Objective:  Vital Signs  Vitals:   04/26/17 0607 04/26/17 1315 04/26/17 2127 04/27/17 0646  BP: 117/64 112/61 (!) 104/58 (!) 96/54  Pulse: 71 65 61 62  Resp: 18 18 19 18   Temp: 98.7 F (37.1 C) 98.2 F (36.8 C) 98.4 F (36.9 C) 98.7 F (37.1 C)  TempSrc: Oral Oral Oral Oral  SpO2: 98% 100% 97% 99%  Weight: 50.9 kg (112 lb 3.4 oz)   50.8 kg (112 lb)  Height:        Intake/Output Summary (Last 24 hours) at 04/27/2017 0947 Last data filed at 04/27/2017 0815 Gross per 24 hour  Intake 720 ml  Output 1750 ml  Net -1030 ml   Filed Weights   04/23/17 0659 04/26/17 0607 04/27/17 0646  Weight: 46.5 kg (102 lb 8.2 oz) 50.9 kg (112 lb 3.4 oz) 50.8 kg (112 lb)    General appearance: Awake alert.  Does not appear to be in any distress. Resp: Diminished air entry at the bases.  crackles are heard.  No wheezing. Cardio: S1-S2 is normal regular.  No S3-S4.  No rubs murmurs or bruits GI: Abdomen remains soft.  Nontender nondistended.  Bowel sounds are present.  No masses organomegaly  Extremities: No edema Neurologic: No new focal deficits except as known previously  Lab  Results:  Data Reviewed: I have personally reviewed following labs and imaging studies  CBC: Recent Labs  Lab 04/22/17 0539 04/23/17 0602 04/24/17 0553 04/25/17 0614 04/26/17 0604 04/27/17 0536  WBC 8.8 7.7 9.8 9.2 9.3 7.5  NEUTROABS 6.2  --   --   --   --   --   HGB 10.1* 10.2* 10.3* 10.9* 10.0* 10.2*  HCT 30.9* 31.3* 31.7* 33.7* 30.5* 30.3*  MCV 101.3* 99.1 100.0 100.3* 99.0 98.7  PLT 125* 123* 143* 157 161 184    Basic Metabolic Panel: Recent Labs  Lab 04/21/17 1039 04/22/17 0539 04/23/17 0602 04/24/17 0553 04/25/17 0614 04/26/17 0604 04/27/17 0536  NA 149* 148* 144 151* 153* 146* 141  K 3.4* 3.6 3.3* 4.3 3.8 3.4* 4.2  CL 118* 121* 117* 122* 124* 119* 115*  CO2 22 21* 21* 21* 22 21* 20*  GLUCOSE 99 108* 109* 115* 131* 107* 107*  BUN 42* 34* 28* 26* 27* 22* 17  CREATININE 1.57* 1.40* 1.14 1.34* 1.18 1.23 1.14  CALCIUM 9.1 9.1 9.0 9.0 9.3 8.9 8.7*  MG 2.6* 2.2  --   --   --   --  1.9    GFR: Estimated Creatinine Clearance: 56.9 mL/min (by C-G formula based on SCr of 1.14 mg/dL).   Recent Labs  Lab 04/25/17 [phone]  AMMONIA 28     Recent Results (from the past 240 hour(s))  Urine culture  Status: None   Collection Time: 04/17/17 10:11 PM  Result Value Ref Range Status   Specimen Description URINE, CLEAN CATCH  Final   Special Requests NONE  Final   Culture   Final    NO GROWTH Performed at [facility], [address] [place], [place] [address]    Report Status 04/19/2017 FINAL  Final      Radiology Studies: Dg Chest Port 1 View  Result Date: 04/25/2017 CLINICAL DATA:  Hypoxia EXAM: PORTABLE CHEST 1 VIEW COMPARISON:  04/23/2017 FINDINGS: Cardiac shadow is stable. The lungs are well aerated bilaterally. Patchy infiltrates are again noted within the right lung stable from the prior study. No acute bony abnormality is seen. IMPRESSION: Stable right-sided infiltrates.  The Electronically Signed   By: Alcide CleverMark  Lukens M.D.   On: 04/25/2017 15:20      Medications:  Scheduled: . amantadine  100 mg Oral BID  . bromocriptine  5 mg Oral BID  . clonazePAM  1 mg Oral QHS  . escitalopram  5 mg Oral Daily  . feeding supplement (ENSURE ENLIVE)  237 mL Oral TID BM  . lacosamide  50 mg Oral BID  . oxcarbazepine  600 mg Oral BID  . pantoprazole  40 mg Oral Daily  . polyethylene glycol  17 g Oral BID  . rifaximin  400 mg Oral BID  . topiramate  100 mg Oral BID   Continuous: . ampicillin-sulbactam (UNASYN) IV Stopped (04/27/17 0100)  . dextrose 10 mL/hr at 04/27/17 [id number]   WGN:[id number]:acetaminophen **OR** acetaminophen, ondansetron **OR** ondansetron (ZOFRAN) IV  Assessment/Plan:  Principal Problem:   Coarse tremors Active Problems:   Alcoholic cirrhosis of liver without ascites (HCC)   Seizure (HCC)   ARF (acute renal failure) (HCC)    Aspiration pneumonia with acute hypoxic respiratory failure Patient has had 2 episodes where it appears that he aspirated while eating.  Patient is on Unasyn currently.  Had another episode of hypoxia on 12/31.  Chest x-ray showed stable findings.  Patient seen by speech therapy and plan is to do modified barium swallow today.    Tremors He was evaluated by neurology.  Underwent brain MRI which did not show any acute process.  Left-sided encephalomalacia was noted.  EEG was also unremarkable.  Neurology recommended decreasing doses of bromocriptine, Lexapro, Seroquel, and discontinuing Linzess, as all of these could contribute to tremor.  If needed, all but Linzess could be restarted gradually, and one at a time.  Symptoms could also have been caused by viral syndrome.  Seems to have improved.  Liver cirrhosis The patient has a long history of cirrhosis, follows [facility].  He is intolerant or allergic to lactulose. On admission he was noted to have high ammonia.  Clinically hepatic encephalopathy was obviously hard to discern given his baseline cognitive function. This was discussed with  [facility], Dr. Aline AugustHolmes by previous rounding doctors, who felt a suboptimal alternative that might be suitable would be starting rifaximin and titrating MiraLAX to have 2-3 soft BM per day.  Patient was started on MiraLAX and rifaximin.  Seems to be stable.  Hypernatremia Due to poor free water intake.  Patient sodium level initially improved with D5 infusion.  This was stopped within sodium level started climbing again.  Patient was started back on D5 infusion.  Sodium has improved.  We will cut back the D5 water rate to 10 mL/h.  Patient undergo swallow evaluation today.  Repeat labs tomorrow.  Hypokalemia Repleted.  Seizure disorder No seizures in the hospital. Continue Vimpat, Topamax, Trileptal.  Macrocytic anemia Stable.  No evidence for overt bleeding.  Hemoglobin is stable.  Thrombocytopenia Etiology unclear.  Counts have improved.  Depression Decreased Lexapro dose, stopped Seroquel  DVT Prophylaxis: SCDs    Code Status: Full code Family Communication: No family at bedside Disposition Plan: Physical therapy recommends skilled nursing facility.  Family apparently wants to take him home.  Patient not ready for discharge today.  Swallow evaluation to be completed today.  Then we will need to change him to oral antibiotics.  Sodium level needs to remain stable as well.    LOS: 9 days   Osvaldo Shipper  [facility] Pager [phone] 04/27/2017, 9:47 AM  If 7PM-7AM, please contact night-coverage at [email or web address], password [facility]

## 2017-04-28 DIAGNOSIS — R0902 Hypoxemia: Secondary | ICD-10-CM

## 2017-04-28 LAB — BASIC METABOLIC PANEL
Anion gap: 5 (ref 5–15)
BUN: 17 mg/dL (ref 6–20)
CALCIUM: 8.7 mg/dL — AB (ref 8.9–10.3)
CO2: 21 mmol/L — AB (ref 22–32)
CREATININE: 1.15 mg/dL (ref 0.61–1.24)
Chloride: 116 mmol/L — ABNORMAL HIGH (ref 101–111)
GFR calc non Af Amer: >60 mL/min (ref >60)
Glucose, Bld: 120 mg/dL — ABNORMAL HIGH (ref 65–99)
Potassium: 4.3 mmol/L (ref 3.5–5.1)
SODIUM: 142 mmol/L (ref 135–145)

## 2017-04-28 NOTE — Progress Notes (Signed)
Physical Therapy Treatment Patient Details Name: Hector NianRobert W [name] MRN: [id number] DOB: [birth year] Today's Date: 04/28/2017    History of Present Illness 47 year old male with history of traumatic brain injury, seizure disorder, question of alcohol liver cirrhosis presented with worsening tremors    PT Comments    Pt AxOx 3 following all commands but present with expressive aphagia.  He responds with head gestures and some mixed matched words and often says "I don't know" with ease.  Assisted OOB to amb a total of 120 feet but need to sit half way at 60 feet due to dyspnea with increased RR and sats decreased to 87% on 2 lts.   Unsteady gait but much improved ataxia.  Still would not rec he amb by himself.  HIGH FALL RISK.    Follow Up Recommendations  SNF     Equipment Recommendations  None recommended by PT    Recommendations for Other Services       Precautions / Restrictions Precautions Precautions: Fall Precaution Comments: old TBI with expressive aphagia Restrictions Weight Bearing Restrictions: No    Mobility  Bed Mobility Overal bed mobility: Needs Assistance Bed Mobility: Supine to Sit;Sit to Supine     Supine to sit: Min guard Sit to supine: Min guard   General bed mobility comments: able to self perform with increased time  Transfers Overall transfer level: Needs assistance Equipment used: Rolling walker (2 wheeled);None Transfers: Sit to/from [employer]Stand;Stand Pivot Transfers Sit to Stand: Min assist;Mod assist Stand pivot transfers: Min assist;Mod assist       General transfer comment: 50% VC's on safety with turns and negociating around obsticles  Ambulation/Gait Ambulation/Gait assistance: Mod assist;+2 safety/equipment(recliner to follow ) Ambulation Distance (Feet): 120 Feet(60 feet x 2 one sitting rest break) Assistive device: Rolling walker (2 wheeled) Gait Pattern/deviations: Step-to pattern;Ataxic;Staggering right;Staggering left;Trunk flexed Gait  velocity: VC's to decrease speed and slow RR   General Gait Details: ataxia is improved.  Pt able to self guide walker with only <25% assist to safely navigate around obsticles.  Mild unsteady gait with B LE weakness L>R.  Limited distance due to 3/4 dyspnea.  Amb on 2 lts at rest 96% with lowest 87%.  VC's for purse lip breathing as pt is 100% a mouth breather.  HIGH FALL RISK would not rec he amb by himself.     Stairs            Wheelchair Mobility    Modified Rankin (Stroke Patients Only)       Balance                                            Cognition Arousal/Alertness: Awake/alert Behavior During Therapy: WFL for tasks assessed/performed Overall Cognitive Status: Within Functional Limits for tasks assessed                                        Exercises      General Comments        Pertinent Vitals/Pain Pain Assessment: No/denies pain    Home Living                      Prior Function            PT Goals (current goals can now  be found in the care plan section) Progress towards PT goals: Progressing toward goals    Frequency    Min 2X/week      PT Plan Discharge plan needs to be updated;Current plan remains appropriate    Co-evaluation              AM-PAC PT "6 Clicks" Daily Activity  Outcome Measure  Difficulty turning over in bed (including adjusting bedclothes, sheets and blankets)?: A Lot Difficulty moving from lying on back to sitting on the side of the bed? : A Lot Difficulty sitting down on and standing up from a chair with arms (e.g., wheelchair, bedside commode, etc,.)?: Unable Help needed moving to and from a bed to chair (including a wheelchair)?: Total Help needed walking in hospital room?: Total Help needed climbing 3-5 steps with a railing? : Total 6 Click Score: 8    End of Session Equipment Utilized During Treatment: Gait belt Activity Tolerance: Patient tolerated  treatment well Patient left: in bed;with call bell/phone within reach;with family/visitor present;with nursing/sitter in room Nurse Communication: Mobility status PT Visit Diagnosis: Difficulty in walking, not elsewhere classified (R26.2)     Time: 1355-1420 PT Time Calculation (min) (ACUTE ONLY): 25 min  Charges:  $Gait Training: 8-22 mins $Therapeutic Activity: 8-22 mins                    G Codes:       {Hendryx Ricke  PTA [facility]  Acute  Rehab Pager      [phone]

## 2017-04-28 NOTE — Progress Notes (Addendum)
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  Neurology and psychiatry were consulted. Patient developed aspiration pneumonia and is started on antibiotics.  Assessment & Plan:   # Aspiration pneumonia with acute hypoxic respiratory failure: Continue IV Unasyn, wean oxygen to room air. Continue supportive care and breathing treatment. -Likely transition to oral antibiotics tomorrow. Still having very poor oral intake.  #Tremor: Evaluated by neurologist. Underwent  brain MRI we did not show any acute process.Left-sided encephalomalacia was noted.  EEG was also unremarkable.  Neurology recommended decreasing doses of bromocriptine, Lexapro, Seroquel, and discontinuing Linzess, as all of these could contribute to tremor. If needed, all but Linzess could be restarted gradually, and one at a time. -stable.  # History of liver cirrhosis: Continue Xifaxan. Recommended to follow up with [facility] GI physician at [facility].  # Hypernatremia due to poor oral intake. Serum sodium level improved. Discontinue dextrose IV.  #Moderate protein calorie malnutrition: Patient with poor oral intake and has oropharyngeal dysphagia. Currently on dysphagia level III diet. Still having poor oral intake. Encouraged and needed support during feeding. Discontinue IV dextrose today.  #Seizure disorder: No seizure in the hospital. Continue Vimpat, Topamax and Trileptal. Recommend outpatient follow-up.  #Thrombocytopenia: Improved now.  #Acute kidney injury: Improving.  PT OT recommended a skilled nursing home. Patient requires full support. I consulted social worker for safe  discharge plan. If family declined nursing home then may need home care services on discharge. Case manager referred.  DVT prophylaxis:SCD Code Status:FC Family Communication: discussed with the patient's father on phone. Disposition Plan: Likely discharge to SNF vs home with home care services in 1-2 days.    Consultants:   Neurology  Psychiatrist  Procedures: EEG Antimicrobials: Unasyn  Subjective: Seen and examined at bedside. Denies nausea vomiting chest pain or shortness of breath. Review of systems Limited.  Objective: Vitals:   04/27/17 2142 04/28/17 0500 04/28/17 0613 04/28/17 1421  BP: (!) 92/53  (!) 92/55 101/63  Pulse: 77  70 66  Resp: 18  18 18   Temp: 97.8 F (36.6 C)  97.9 F (36.6 C) 97.6 F (36.4 C)  TempSrc: Oral  Oral Oral  SpO2: 96%  98% 100%  Weight:  50.7 kg (111 lb 12.4 oz)    Height:        Intake/Output Summary (Last 24 hours) at 04/28/2017 1512 Last data filed at 04/28/2017 1400 Gross per 24 hour  Intake 930 ml  Output 1476 ml  Net -546 ml   Filed Weights   04/26/17 0607 04/27/17 0646 04/28/17 0500  Weight: 50.9 kg (112 lb 3.4 oz) 50.8 kg (112 lb) 50.7 kg (111 lb 12.4 oz)    Examination:  General exam: Appears calm and comfortable  Respiratory system: Clear to auscultation. Respiratory effort normal. No wheezing or crackle Cardiovascular system: S1 & S2 heard, RRR.  No pedal edema. Gastrointestinal system: Abdomen is nondistended, soft and nontender. Normal bowel sounds heard. Central nervous system: Alert awake. Skin: No rashes, lesions or ulcers Psychiatry: Difficult to assess    Data Reviewed: I have personally reviewed following labs and imaging studies  CBC: Recent Labs  Lab 04/22/17 0539 04/23/17 0602 04/24/17 9562 04/25/17 1308 04/26/17 6578  04/27/17 0536  WBC 8.8 7.7 9.8 9.2 9.3 7.5  NEUTROABS 6.2  --   --   --   --   --   HGB 10.1* 10.2* 10.3* 10.9* 10.0* 10.2*  HCT 30.9* 31.3* 31.7* 33.7* 30.5* 30.3*  MCV 101.3*  99.1 100.0 100.3* 99.0 98.7  PLT 125* 123* 143* 157 161 184   Basic Metabolic Panel: Recent Labs  Lab 04/22/17 0539  04/24/17 0553 04/25/17 0614 04/26/17 0604 04/27/17 0536 04/28/17 0548  NA 148*   < > 151* 153* 146* 141 142  K 3.6   < > 4.3 3.8 3.4* 4.2 4.3  CL 121*   < > 122* 124* 119* 115* 116*  CO2 21*   < > 21* 22 21* 20* 21*  GLUCOSE 108*   < > 115* 131* 107* 107* 120*  BUN 34*   < > 26* 27* 22* 17 17  CREATININE 1.40*   < > 1.34* 1.18 1.23 1.14 1.15  CALCIUM 9.1   < > 9.0 9.3 8.9 8.7* 8.7*  MG 2.2  --   --   --   --  1.9  --    < > = values in this interval not displayed.   GFR: Estimated Creatinine Clearance: 56.3 mL/min (by C-G formula based on SCr of 1.15 mg/dL). Liver Function Tests: No results for input(s): AST, ALT, ALKPHOS, BILITOT, PROT, ALBUMIN in the last 168 hours. No results for input(s): LIPASE, AMYLASE in the last 168 hours. Recent Labs  Lab 04/25/17 0614  AMMONIA 28   Coagulation Profile: No results for input(s): INR, PROTIME in the last 168 hours. Cardiac Enzymes: No results for input(s): CKTOTAL, CKMB, CKMBINDEX, TROPONINI in the last 168 hours. BNP (last 3 results) No results for input(s): PROBNP in the last 8760 hours. HbA1C: No results for input(s): HGBA1C in the last 72 hours. CBG: No results for input(s): GLUCAP in the last 168 hours. Lipid Profile: No results for input(s): CHOL, HDL, LDLCALC, TRIG, CHOLHDL, LDLDIRECT in the last 72 hours. Thyroid Function Tests: No results for input(s): TSH, T4TOTAL, FREET4, T3FREE, THYROIDAB in the last 72 hours. Anemia Panel: No results for input(s): VITAMINB12, FOLATE, FERRITIN, TIBC, IRON, RETICCTPCT in the last 72 hours. Sepsis Labs: No results for input(s): PROCALCITON, LATICACIDVEN in the last 168 hours.  No results found for this or any previous visit (from the past 240 hour(s)).       Radiology Studies: Dg Swallowing Func-speech Pathology  Result Date: 04/27/2017 Objective Swallowing  Evaluation: Type of Study: MBS-Modified Barium Swallow Study  Patient Details Name: Hector [name] MRN: [id number] Date of Birth: 20-Aug-1970 Today's Date: 04/27/2017 Time: SLP Start Time (ACUTE ONLY): 1110 -SLP Stop Time (ACUTE ONLY): 1135 SLP Time Calculation (min) (ACUTE ONLY): 25 min Past Medical History: Past Medical History: Diagnosis Date . Cirrhosis (HCC)  . Depression  . DVT (deep venous thrombosis) (HCC)   UE, LE DVT during hospitalization . GERD (gastroesophageal reflux disease)  . H/O ETOH abuse  . H/O renal calculi  . Seizure (HCC)  . TBI (traumatic brain injury) (HCC)   07/2013, fall from standing post seizure Past Surgical History: Past Surgical History: Procedure Laterality Date . BRAIN SURGERY   . GASTROSTOMY TUBE PLACEMENT   . RADIOLOGY WITH ANESTHESIA N/A 04/20/2017  Procedure: MRI WITH ANESTHESIA OF THE BRAIN;  Surgeon: Radiologist, Medication, MD;  Location: [facility] OR;  Service: Radiology;  Laterality: N/A; . REMOVAL OF GASTROSTOMY TUBE   . TRACHEOSTOMY    feinstein HPI: 47 yo  with h/o TBI, seizures, ? ETOH use, aphasia, [facility] tremors and "stuttering" 04/17/2017.  Pt noted to have respiratory difficulties while in hospital with concerns for dysphagia.  Intial CXR 04/17/2017 clear but CXR 12/29 and most recent xray 12/31 show right sided infiltrate- stable.  Swallow evaluation ordered. MBS order placed due to pt h/o dysphagia/aspiration.   Subjective: pt awake in chair Assessment / Plan / Recommendation CHL IP CLINICAL IMPRESSIONS 04/27/2017 Clinical Impression Pt presents with mild oral and moderate pharyngeal dysphagia nearly [facility]  He continues with SILENT +aspiration of secretions c/b wet voice that does not clear with cued cough/throat clear or dry swallow.  Pt takes large boluses - which contributes largely to his aspiration risk.  He demonstrates decreased oral control resulting in premature spillage of barium into pharynx uncontrolled.   No aspiration or deep laryngeal penetration of nectar, honey, pudding or cracker.  Pharyngeal residuals noted with both solids/liquids that mixes with secretions.  Cued dry swallows effectively decreased residuals.  Pt chronically cleared his throat throughout exam after initial swallow of barium - ? if due to sensate to secretion aspiration.   Pt with suspected aspiration of thin when taking barium tablet - at least deep penetration.   Following the exam, pt overtly coughing with thin water - much improved tolerance clinically of thicker water.   Using live video, SLP educated pt to findings and reinforced effective compensation strategies.     SLP Visit Diagnosis Dysphagia, oropharyngeal phase (R13.12) Attention and concentration deficit following -- Frontal lobe and executive function deficit following -- Impact on safety and function Moderate aspiration risk   CHL IP TREATMENT RECOMMENDATION 04/27/2017 Treatment Recommendations Therapy as outlined in treatment plan below   Prognosis 04/27/2017 Prognosis for Safe Diet Advancement Guarded Barriers to Reach Goals Time post onset;Cognitive deficits Barriers/Prognosis Comment -- CHL IP DIET RECOMMENDATION 04/27/2017 SLP Diet Recommendations Dysphagia 3 (Mech soft) solids;Nectar thick liquid;Other (Comment);Free water protocol after oral care Liquid Administration via Cup;Straw Medication Administration Whole meds with puree Compensations Slow rate;Small sips/bites;Multiple dry swallows after each bite/sip Postural Changes Remain semi-upright after after feeds/meals (Comment);Seated upright at 90 degrees   CHL IP OTHER RECOMMENDATIONS 04/27/2017 Recommended Consults -- Oral Care Recommendations Oral care BID Other Recommendations --   CHL IP FOLLOW UP RECOMMENDATIONS 04/27/2017 Follow up Recommendations Other (comment)   CHL IP FREQUENCY AND DURATION 04/27/2017 Speech Therapy Frequency (ACUTE ONLY) min 2x/week Treatment Duration 1 week      CHL IP ORAL PHASE 04/27/2017 Oral Phase  Impaired Oral - Pudding Teaspoon -- Oral - Pudding Cup -- Oral - Honey Teaspoon -- Oral - Honey Cup -- Oral - Nectar Teaspoon -- Oral - Nectar Cup Decreased bolus cohesion;Delayed oral transit;Piecemeal swallowing Oral - Nectar Straw -- Oral - Thin Teaspoon -- Oral - Thin Cup Decreased bolus cohesion;Delayed oral transit;Piecemeal swallowing Oral - Thin Straw Decreased bolus cohesion;Delayed oral transit;Piecemeal swallowing Oral - Puree Decreased bolus cohesion;Delayed oral transit;Piecemeal swallowing Oral - Mech Soft Delayed oral transit;Decreased bolus cohesion;Piecemeal swallowing Oral - Regular -- Oral - Multi-Consistency -- Oral - Pill Delayed oral transit;Decreased bolus cohesion;Piecemeal swallowing Oral Phase - Comment --  CHL IP PHARYNGEAL PHASE 04/27/2017 Pharyngeal Phase Impaired Pharyngeal- Pudding Teaspoon -- Pharyngeal -- Pharyngeal- Pudding Cup -- Pharyngeal -- Pharyngeal- Honey Teaspoon -- Pharyngeal -- Pharyngeal- Honey Cup -- Pharyngeal -- Pharyngeal- Nectar Teaspoon -- Pharyngeal -- Pharyngeal- Nectar Cup Reduced laryngeal elevation;Reduced airway/laryngeal closure;Reduced epiglottic inversion;Pharyngeal residue - valleculae;Pharyngeal residue - pyriform;Reduced  pharyngeal peristalsis Pharyngeal -- Pharyngeal- Nectar Straw -- Pharyngeal -- Pharyngeal- Thin Teaspoon -- Pharyngeal -- Pharyngeal- Thin Cup Reduced laryngeal elevation;Reduced pharyngeal peristalsis;Reduced airway/laryngeal closure;Pharyngeal residue - valleculae;Pharyngeal residue - pyriform;Penetration/Apiration after swallow Pharyngeal Material enters airway, remains ABOVE vocal cords then ejected out Pharyngeal- Thin Straw Reduced epiglottic inversion;Reduced anterior laryngeal mobility;Reduced laryngeal elevation;Reduced pharyngeal peristalsis;Reduced airway/laryngeal closure;Reduced tongue base retraction;Penetration/Apiration after swallow Pharyngeal Material enters airway, remains ABOVE vocal cords then ejected out Pharyngeal-  Puree Reduced laryngeal elevation;Reduced airway/laryngeal closure;Pharyngeal residue - valleculae Pharyngeal -- Pharyngeal- Mechanical Soft Reduced laryngeal elevation;Reduced airway/laryngeal closure;Pharyngeal residue - valleculae Pharyngeal -- Pharyngeal- Regular -- Pharyngeal -- Pharyngeal- Multi-consistency -- Pharyngeal -- Pharyngeal- Pill Reduced anterior laryngeal mobility;Reduced laryngeal elevation;Reduced pharyngeal peristalsis;Reduced airway/laryngeal closure;Reduced tongue base retraction Pharyngeal -- Pharyngeal Comment multiple swallows, cued "strong swallow" helpful, pt chronically throat clearing once MBS commensed  CHL IP CERVICAL ESOPHAGEAL PHASE 04/27/2017 Cervical Esophageal Phase Impaired Pudding Teaspoon -- Pudding Cup -- Honey Teaspoon -- Honey Cup -- Nectar Teaspoon -- Nectar Cup -- Nectar Straw -- Thin Teaspoon -- Thin Cup -- Thin Straw -- Puree -- Mechanical Soft -- Regular -- Multi-consistency -- Pill -- Cervical Esophageal Comment appearance of barium mixed with secretions accumulated at pyriform sinuses Donavan Burnet, MS [facility] SLP [phone] 04/27/2017, 12:26 PM                   Scheduled Meds: . amantadine  100 mg Oral BID  . bromocriptine  5 mg Oral BID  . clonazePAM  1 mg Oral QHS  . escitalopram  5 mg Oral Daily  . feeding supplement (ENSURE ENLIVE)  237 mL Oral TID BM  . lacosamide  50 mg Oral BID  . oxcarbazepine  600 mg Oral BID  . pantoprazole  40 mg Oral Daily  . polyethylene glycol  17 g Oral BID  . rifaximin  400 mg Oral BID  . topiramate  100 mg Oral BID   Continuous Infusions: . ampicillin-sulbactam (UNASYN) IV Stopped (04/28/17 1135)  . dextrose 10 mL/hr at 04/27/17 4540     LOS: 10 days    Nanetta Wiegman Jaynie Collins, MD [facility] Pager [phone]  If 7PM-7AM, please contact night-coverage [email or web address] Password [email or web address] 04/28/2017, 3:12 PM

## 2017-04-28 NOTE — Progress Notes (Signed)
  Speech Language Pathology Treatment: Dysphagia  Patient Details Name: Hector NianRobert W Petree MRN: [id number] DOB: 1970-07-26 Today's Date: 04/28/2017 Time: [id number] SLP Time Calculation (min) (ACUTE ONLY): 10 min  Assessment / Plan / Recommendation Clinical Impression  Intake remains poor with pt only consuming portion of his magic cup yesterday.  Today SLP thickened soda for pt = which he readily consumed via straw.  Immediate throat clearing noted with all intake - however this was observed during MBS when pt had NOT aspirated.  He has chronic secretion aspiration which likely contributes to throat clearing symptoms.  Pt reflexively conducts multiple swallows with boluses but will require extra time to conduct - Recommend continue diet with full supervision initially to maximize swallow safety and intake - making sure to allow him extra time for reflexive dry swallows.  No family present - SlP educated pt and posted signs in room but pt's understanding impaired due to his aphasia from TBI.  Will follow up for family education.      HPI HPI: 47 yo with h/o TBI, seizures, ? ETOH use, aphasia, [facility]ors and "stuttering" 04/17/2017.  Pt noted to have respiratory difficulties while in hospital with concerns for dysphagia.  Intial CXR 04/17/2017 clear but CXR 12/29 and most recent xray 12/31 show right sided infiltrate- stable.  Swallow evaluation ordered. MBS order placed due to pt h/o dysphagia/aspiration.  MBS completed and pt diet modified to maxmize swallow safety and comfort with intake.  Today pt seen to assess po tolerance and reinforce helpful strategies as well as educate family that may be present.       SLP Plan  Continue with current plan of care       Recommendations  Diet recommendations: Dysphagia 3 (mechanical soft);Nectar-thick liquid(free water between meals) Liquids provided via: Straw;Cup Medication Administration: Whole meds with puree Supervision:  Full supervision/cueing for compensatory strategies(to help control rate as pt is impulsive) Compensations: Minimize environmental distractions;Slow rate;Small sips/bites;Multiple dry swallows after each bite/sip(pt conducts reflexive dry swallows) Postural Changes and/or Swallow Maneuvers: Seated upright 90 degrees;Upright 30-60 min after meal                Oral Care Recommendations: Oral care QID Follow up Recommendations: (tbd) SLP Visit Diagnosis: Dysphagia, oropharyngeal phase (R13.12) Plan: Continue with current plan of care       GO                Chales AbrahamsKimball, Seena Ritacco Ann 04/28/2017, 8:40 AM Donavan Burnetamara Chaela Branscum, MS [facility] SLP [phone]

## 2017-04-29 DIAGNOSIS — J690 Pneumonitis due to inhalation of food and vomit: Secondary | ICD-10-CM

## 2017-04-29 MED ORDER — ESCITALOPRAM OXALATE 5 MG PO TABS: 5.0000 mg | ORAL_TABLET | Freq: Every day | ORAL | 0 refills | Status: DC

## 2017-04-29 MED ORDER — RIFAXIMIN 200 MG PO TABS: 400.0000 mg | ORAL_TABLET | Freq: Two times a day (BID) | ORAL | 0 refills | Status: DC

## 2017-04-29 NOTE — Progress Notes (Signed)
Pharmacy Antibiotic Note  Hector [name] is a 47 y.o. male admitted on 04/17/2017 with aspiration pneumonia.  Pharmacy has been consulted for Unasyn dosing.  04/29/2017 Day #6 amp/sulbactam  Renal: SCr WNL  WBC WNL  afebrile  Plan:  Unasyn 3 gm IV q6h  Consider stopping antibiotics after 1/5 dose  Monitor clinical course, renal function, cultures as available    Height: 5\' 10"  (177.8 cm) Weight: 111 lb 12.4 oz (50.7 kg) IBW/kg (Calculated) : 73  Temp (24hrs), Avg:97.8 F (36.6 C), Min:97.6 F (36.4 C), Max:98 F (36.7 C)  Recent Labs  Lab 04/23/17 0602 04/24/17 0553 04/25/17 0614 04/26/17 0604 04/27/17 0536 04/28/17 0548  WBC 7.7 9.8 9.2 9.3 7.5  --   CREATININE 1.14 1.34* 1.18 1.23 1.14 1.15    Estimated Creatinine Clearance: 56.3 mL/min (by C-G formula based on SCr of 1.15 mg/dL).    Allergies  Allergen Reactions  . Ativan [Lorazepam] Other (See Comments)    Pt has paradoxical effect with sedating medications.   . Lactulose Hives    Antimicrobials this admission:  12/29 Unasyn >>   Dose adjustments this admission:   Microbiology results:  12/23 UCx: NG  Thank you for allowing pharmacy to be a part of this patient's care.  Juliette Alcideustin Tyara Dassow, PharmD, BCPS.   Pager: [phone] 04/29/2017 10:39 AM

## 2017-04-29 NOTE — Progress Notes (Signed)
The Xifaxan was not approved by [employer] insurance company.  I called and spoke with patient's father to follow up with GI in 1 weeks.  He verbalized understanding.  I will remove Xifaxan from the medication list.

## 2017-04-29 NOTE — Discharge Summary (Addendum)
Physician Discharge Summary  Hector [name] ZOX:[id number] DOB: April 07, 1971 DOA: 04/17/2017  PCP: Ulyess Blossom, MD  Admit date: 04/17/2017 Discharge date: 04/29/2017  Admitted From:home Disposition: home with home care (patient's father declined SNF)  Recommendations for Outpatient Follow-up:  1. Follow up with PCP in 1-2 weeks 2. Please obtain BMP/CBC in one week   Home Health:yes Equipment/Devices:none Discharge Condition:stable CODE STATUS:full code Diet recommendation: Diet recommendations: Regular;Thin liquid Liquids provided via: Straw;Cup Medication Administration: Whole meds with puree Supervision: Patient able to self feed Compensations: Minimize environmental distractions;Slow rate;Small sips/bites;Multiple dry swallows after each bite/sip(pt conducts reflexive dry swallows) Postural Changes and/or Swallow Maneuvers: Seated upright 90 degrees;Upright 30-60 min after meal  Brief/Interim Summary: 47 year-old Caucasian male with a past medical history of traumatic brain injury with dural hematoma and 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 Neurology and psychiatry were consulted. Patient developed aspiration pneumonia and is started on antibiotics.  # Aspiration pneumonia with acute hypoxic respiratory failure: Completed a week of IV Unasyn.  Currently on room air.  No chest pain or shortness of breath.  Aspiration precaution.  Patient is currently on dysphagia level 3 diet.  Outpatient swallow evaluation recommended and ordered on discharge.  #Tremor likely polypharmacy related: Evaluated by neurologist. Underwent  brain MRI we did not show any acute process.Left-sided encephalomalacia was noted. EEG was also unremarkable. Neurology recommended discontinuation of some of the medication and dose reduction.  -Patient with no further tremor.  Mental  status on baseline.  # History of liver cirrhosis: Continue Xifaxan. Recommended to follow up with his GI physician at [facility]  As per prior provider'ss progress note from 1/2: It was discussed with [facility], Dr. Aline August.  Recommended to follow-up with his GI physician for further evaluation. -xifaxan didn't approve from the insurance -US liver with likely steotosis, no evidence of overt cirrhosis.  # Hypernatremia due to poor oral intake. Serum sodium level improved.  #Moderate protein calorie malnutrition: Patient with poor oral intake and has oropharyngeal dysphagia. Currently on dysphagia level III diet.   #Seizure disorder: No seizure in the hospital.  Continue home medication.  #Thrombocytopenia: Improved now.  #Acute kidney injury: Improving.  Patient is clinically improved and around baseline.  He needs PT OT and care at home.  PT OT recommended SNF however patient's father declined and wanted to attend the patient home.  Home care services ordered.  I have discussed with the patient's father yesterday and today.  He needs to follow-up with GI and PCP outpatient.  Discharge Diagnoses:  Principal Problem:   Coarse tremors Active Problems:   Alcoholic cirrhosis of liver without ascites (HCC)   Seizure (HCC)   ARF (acute renal failure) (HCC)   Hypoxia   Aspiration pneumonia due to gastric secretions [facility])    Discharge Instructions  Discharge Instructions    Call MD for:  difficulty breathing, headache or visual disturbances   Complete by:  As directed    Call MD for:  extreme fatigue   Complete by:  As directed    Call MD for:  hives   Complete by:  As directed    Call MD for:  persistant dizziness or light-headedness   Complete by:  As directed    Call MD for:  persistant nausea and vomiting   Complete by:  As directed    Call MD for:  severe uncontrolled pain   Complete by:  As directed    Call MD for:  temperature >100.4   Complete by:  As directed    Diet -  low sodium heart healthy   Complete by:  As directed    Diet recommendations: Regular;Thin liquid Liquids provided via: Straw;Cup Medication Administration: Whole meds with puree Supervision: Patient able to self feed Compensations: Minimize environmental distractions;Slow rate;Small sips/bites;Multiple dry swallows after each bite/sip(pt conducts reflexive dry swallows) Postural Changes and/or Swallow Maneuvers: Seated upright 90 degrees;Upright 30-60 min after meal   Increase activity slowly   Complete by:  As directed      Allergies as of 04/29/2017      Reactions   Ativan [lorazepam] Other (See Comments)   Pt has paradoxical effect with sedating medications.    Lactulose Hives      Medication List    STOP taking these medications   LINZESS 290 MCG Caps capsule Generic drug:  linaclotide   Potassium Chloride ER 20 MEQ Tbcr   QUEtiapine 100 MG tablet Commonly known as:  SEROQUEL     TAKE these medications   amantadine 100 MG capsule Commonly known as:  SYMMETREL TAKE ONE CAPSULE BY MOUTH TWICE A DAY   bromocriptine 2.5 MG tablet Commonly known as:  PARLODEL Take 5 mg by mouth 2 (two) times daily.   clonazePAM 1 MG tablet Commonly known as:  KLONOPIN Take 1 mg by mouth at bedtime.   escitalopram 5 MG tablet Commonly known as:  LEXAPRO Take 1 tablet (5 mg total) by mouth daily. Start taking on:  04/30/2017 What changed:    medication strength  how much to take   feeding supplement (ENSURE ENLIVE) Liqd Take 237 mLs 3 (three) times daily between meals by mouth.   lacosamide 50 MG Tabs tablet Commonly known as:  VIMPAT Take 1 tablet (50 mg total) by mouth 2 (two) times daily.   ondansetron 8 MG disintegrating tablet Commonly known as:  ZOFRAN ODT Take 1 tablet (8 mg total) every 8 (eight) hours as needed by mouth for nausea or vomiting.   oxcarbazepine 600 MG tablet Commonly known as:  TRILEPTAL TAKE 1 TABLET BY MOUTH TWICE A DAY   pantoprazole 40 MG  tablet Commonly known as:  PROTONIX Take 40 mg by mouth daily.   polyethylene glycol packet Commonly known as:  MIRALAX / GLYCOLAX Take 17 g by mouth daily as needed for moderate constipation.   topiramate 100 MG tablet Commonly known as:  TOPAMAX Take 1 tablet (100 mg total) 2 (two) times daily by mouth.      Follow-up Information    Ulyess Blossom, MD. Schedule an appointment as soon as possible for a visit in 1 week(s).   Specialty:  Family Medicine Contact information: [address] [place] [address] [phone]        [facility] Follow up.   Specialty:  Home Health Services Contact information: [address] [place] [address] [phone]          Allergies  Allergen Reactions  . Ativan [Lorazepam] Other (See Comments)    Pt has paradoxical effect with sedating medications.   . Lactulose Hives    Consultations: Neurology Psychiatrist  Procedures/Studies: EEG  Subjective: Seen and examined at bedside.  Mental status around baseline.  Denies headache, dizziness, nausea vomiting.  Following simple commands.  Review of systems limited.  Discussed with the patient's father over the phone.  Discharge Exam: Vitals:   04/29/17 0443 04/29/17 1100  BP: Marland Kitchen)  109/50   Pulse: 62   Resp: 18   Temp: 98 F (36.7 C)   SpO2: 100% 100%   Vitals:   04/28/17 1421 04/28/17 2222 04/29/17 0443 04/29/17 1100  BP: 101/63 103/65 (!) 109/50   Pulse: 66 70 62   Resp: 18 18 18    Temp: 97.6 F (36.4 C) 97.8 F (36.6 C) 98 F (36.7 C)   TempSrc: Oral Oral Oral   SpO2: 100% 100% 100% 100%  Weight:      Height:        General: Pt is alert, awake, not in acute distress Cardiovascular: RRR, S1/S2 +, no rubs, no gallops Respiratory: CTA bilaterally, no wheezing, no rhonchi Abdominal: Soft, NT, ND, bowel sounds + Extremities: no edema, no cyanosis    The results of significant diagnostics from this hospitalization  (including imaging, microbiology, ancillary and laboratory) are listed below for reference.     Microbiology: No results found for this or any previous visit (from the past 240 hour(s)).   Labs: BNP (last 3 results) No results for input(s): BNP in the last 8760 hours. Basic Metabolic Panel: Recent Labs  Lab 04/24/17 0553 04/25/17 0614 04/26/17 0604 04/27/17 0536 04/28/17 0548  NA 151* 153* 146* 141 142  K 4.3 3.8 3.4* 4.2 4.3  CL 122* 124* 119* 115* 116*  CO2 21* 22 21* 20* 21*  GLUCOSE 115* 131* 107* 107* 120*  BUN 26* 27* 22* 17 17  CREATININE 1.34* 1.18 1.23 1.14 1.15  CALCIUM 9.0 9.3 8.9 8.7* 8.7*  MG  --   --   --  1.9  --    Liver Function Tests: No results for input(s): AST, ALT, ALKPHOS, BILITOT, PROT, ALBUMIN in the last 168 hours. No results for input(s): LIPASE, AMYLASE in the last 168 hours. Recent Labs  Lab 04/25/17 0614  AMMONIA 28   CBC: Recent Labs  Lab 04/23/17 0602 04/24/17 0553 04/25/17 0614 04/26/17 0604 04/27/17 0536  WBC 7.7 9.8 9.2 9.3 7.5  HGB 10.2* 10.3* 10.9* 10.0* 10.2*  HCT 31.3* 31.7* 33.7* 30.5* 30.3*  MCV 99.1 100.0 100.3* 99.0 98.7  PLT 123* 143* 157 161 184   Cardiac Enzymes: No results for input(s): CKTOTAL, CKMB, CKMBINDEX, TROPONINI in the last 168 hours. BNP: Invalid input(s): POCBNP CBG: No results for input(s): GLUCAP in the last 168 hours. D-Dimer No results for input(s): DDIMER in the last 72 hours. Hgb A1c No results for input(s): HGBA1C in the last 72 hours. Lipid Profile No results for input(s): CHOL, HDL, LDLCALC, TRIG, CHOLHDL, LDLDIRECT in the last 72 hours. Thyroid function studies No results for input(s): TSH, T4TOTAL, T3FREE, THYROIDAB in the last 72 hours.  Invalid input(s): FREET3 Anemia work up No results for input(s): VITAMINB12, FOLATE, FERRITIN, TIBC, IRON, RETICCTPCT in the last 72 hours. Urinalysis    Component Value Date/Time   COLORURINE YELLOW 04/17/2017 2211   APPEARANCEUR CLEAR  04/17/2017 2211   LABSPEC 1.013 04/17/2017 2211   PHURINE 7.0 04/17/2017 2211   GLUCOSEU NEGATIVE 04/17/2017 2211   HGBUR NEGATIVE 04/17/2017 2211   BILIRUBINUR NEGATIVE 04/17/2017 2211   KETONESUR NEGATIVE 04/17/2017 2211   PROTEINUR NEGATIVE 04/17/2017 2211   UROBILINOGEN 1.0 02/12/2015 1912   NITRITE NEGATIVE 04/17/2017 2211   LEUKOCYTESUR NEGATIVE 04/17/2017 2211   Sepsis Labs Invalid input(s): PROCALCITONIN,  WBC,  LACTICIDVEN Microbiology No results found for this or any previous visit (from the past 240 hour(s)).   Time coordinating discharge: 33 minutes  SIGNED:   Maxie Barb,  MD  [facility] 04/29/2017, 5:22 PM  If 7PM-7AM, please contact night-coverage [email or web address] Password [email or web address]

## 2017-04-29 NOTE — Progress Notes (Signed)
Discharge instructions and medications discussed with patient's dad.  All questions answered. AVS given to patient's dad.

## 2017-04-29 NOTE — Progress Notes (Addendum)
  Speech Language Pathology Treatment:    Patient Details Name: Lavone NianRobert W Fyock MRN: [id number] DOB: [birth year] Today's Date: 04/29/2017 Time: [id number] SLP Time Calculation (min) (ACUTE ONLY): 19 min  Assessment / Plan / Recommendation Clinical Impression  Pt today with much improved mentation, good tolerance of po intake that he will accept.  SLP observed pt consuming soda (8 ounces) and 3 cracker boluses- no overt indication of aspiration with throat clearing continuing (noted during MBS without aspiration/penetration).  Pt with improved timing of swallow and thus recommend advance diet to regular/thin.  Instructed and assisted pt to importance of oral care -brushing teeth/gums for airway protection given his aspiration of secretions.  Also observed pt using flutter valve and advised continued use for respiratory muscle strengthening/airway protection.    Wrote instructions for pt to share with his family.   No SLP follow up needed as all education completed and suspect pt's swallow is at baseline at this time.    HPI HPI: 47 yo with h/o TBI, seizures, ? ETOH use, aphasia, [facility] with worsening tremors and "stuttering" 04/17/2017.  Pt noted to have respiratory difficulties while in hospital with concerns for dysphagia.  Intial CXR 04/17/2017 clear but CXR 12/29 and most recent xray 12/31 show right sided infiltrate- stable.  Swallow evaluation ordered. MBS order placed due to pt h/o dysphagia/aspiration.  MBS completed and pt diet modified to maxmize swallow safety and comfort with intake.  Today pt seen to assess po tolerance and reinforce helpful strategies as well as educate family that may be present.       SLP Plan  Continue with current plan of care       Recommendations  Diet recommendations: Regular;Thin liquid Liquids provided via: Straw;Cup Medication Administration: Whole meds with puree Supervision: Patient able to self feed Compensations: Minimize environmental  distractions;Slow rate;Small sips/bites;Multiple dry swallows after each bite/sip(pt conducts reflexive dry swallows) Postural Changes and/or Swallow Maneuvers: Seated upright 90 degrees;Upright 30-60 min after meal                Oral Care Recommendations: Oral care QID Follow up Recommendations: (tbd) SLP Visit Diagnosis: Dysphagia, oropharyngeal phase (R13.12) Plan: Continue with current plan of care       GO                Chales AbrahamsKimball, Indea Dearman Ann 04/29/2017, 11:49 AM Donavan Burnetamara Chalene Treu, MS [facility] SLP [phone]

## 2017-04-29 NOTE — Care Management Note (Signed)
Case Management Note  Patient Details  Name: Hector [name] MRN: [id number] Date of Birth: 02-26-1971  Subjective/Objective:     47 yo admitted with Coarse Tremors. Hx of TBI              Action/Plan: From home with father. SNF was declined at this time. This CM met with pt and father at bedside to offer choice for home health services. They state he has used [facility] in the past and would like to use them again. [facility] rep contacted for referral. Pt states he has a RW at home and does not want a 3in1.   Expected Discharge Date:  04/29/17               Expected Discharge Plan:  [place]  In-House Referral:     Discharge planning Services  CM Consult  Post Acute Care Choice:  Home Health Choice offered to:  Patient, Parent  DME Arranged:    DME Agency:     HH Arranged:  PT, Speech Therapy, Social Work, Nurse's Aide [place] Agency:  [place]  Status of Service:  In process, will continue to follow  If discussed at Long Length of Stay Meetings, dates discussed:    Additional CommentsLynnell Catalan, RN 04/29/2017, 2:07 PM  [phone]

## 2017-04-29 NOTE — Progress Notes (Addendum)
NUTRITION FOLLOW UP NOTE  DOCUMENTATION CODES:   Severe malnutrition in context of chronic illness, Underweight  INTERVENTION:   Continue Ensure Enlive (thickened to appropriate consistency) po TID, each supplement provides 350 kcal and 20 grams of protein  NUTRITION DIAGNOSIS:   Severe Malnutrition related to chronic illness(TBI) as evidenced by percent weight loss, moderate fat depletion, severe fat depletion, moderate muscle depletion, severe muscle depletion.  Ongoing   GOAL:   Patient will meet greater than or equal to 90% of their needs  Progressing   MONITOR:   PO intake, Supplement acceptance, Weight trends, Labs  ASSESSMENT:   Pt with PMH significant for TBI, seizures, and expressive aphasia. Recently admitted for generalized weakness and dehydration. Presents this admission with worsening tremors with questionable cause.   01/04 s/p SLP evaluation, recommended advance to regular diet with thin liquids 01/02 s/p MBS, recommended dysphagia III with nectar thick liquids  Discussed pt with RN, she is thickening Ensure to Nectar consistency.  Pt unable to answer RD questions at follow-up.  Per chart intake remains poor, pt consuming between 0-50% of meals at this time.  Weight trending up since last RD visit. Per chart pt net positive 2.5 L since admission.  Pt with hypernatremia, sodium level has improved and is WNL now, IV dextrose was discontinued.  Labs reviewed Medications reviewed; Protonix, Miralax  Diet Order:  DIET DYS 3 Room service appropriate? Yes; Fluid consistency: Nectar Thick  EDUCATION NEEDS:   Not appropriate for education at this time  Skin:  Skin Assessment: Reviewed RN Assessment  Last BM:  04/28/17  Height:   Ht Readings from Last 1 Encounters:  04/20/17 5\' 10"  (1.778 m)    Weight:   Wt Readings from Last 1 Encounters:  04/28/17 111 lb 12.4 oz (50.7 kg)    Ideal Body Weight:  75.5 kg  BMI:  Body mass index is 16.04  kg/m.  Estimated Nutritional Needs:   Kcal:  1400-1600 kcal/day  Protein:  60-70 g/day  Fluid:  >1.4 L/day  Fransisca KaufmannAllison Ioannides, MS, RDN, LDN 04/29/2017 12:17 PM

## 2017-05-03 ENCOUNTER — Encounter: Payer: Self-pay | Admitting: Physical Medicine & Rehabilitation

## 2017-05-03 ENCOUNTER — Other Ambulatory Visit: Payer: Self-pay | Admitting: Neurology

## 2017-05-19 ENCOUNTER — Ambulatory Visit: Payer: Medicaid Other | Admitting: Neurology

## 2017-05-19 ENCOUNTER — Encounter: Payer: Self-pay | Admitting: Neurology

## 2017-05-19 VITALS — BP 144/88 | HR 111 | Ht 68.5 in | Wt 104.2 lb

## 2017-05-19 DIAGNOSIS — R251 Tremor, unspecified: Secondary | ICD-10-CM | POA: Diagnosis not present

## 2017-05-19 DIAGNOSIS — S098XXA Other specified injuries of head, initial encounter: Secondary | ICD-10-CM | POA: Diagnosis not present

## 2017-05-19 DIAGNOSIS — G40209 Localization-related (focal) (partial) symptomatic epilepsy and epileptic syndromes with complex partial seizures, not intractable, without status epilepticus: Secondary | ICD-10-CM | POA: Diagnosis not present

## 2017-05-19 DIAGNOSIS — R4701 Aphasia: Secondary | ICD-10-CM | POA: Diagnosis not present

## 2017-05-19 MED ORDER — PRIMIDONE 50 MG PO TABS: 50.0000 mg | ORAL_TABLET | Freq: Every day | ORAL | 5 refills | Status: DC

## 2017-05-19 MED ORDER — TOPIRAMATE 25 MG PO TABS: ORAL_TABLET | ORAL | 0 refills | Status: DC

## 2017-05-19 MED ORDER — LACOSAMIDE 100 MG PO TABS: 100.0000 mg | ORAL_TABLET | Freq: Two times a day (BID) | ORAL | 5 refills | Status: DC

## 2017-05-19 NOTE — Patient Instructions (Addendum)
1.  Topiramate may be causing decreased appetite and weight loss.  Therefore, we will taper off of it.  I will prescribe 25mg  tablets:   Take 2 tablets twice daily for 7 days   Then 1 tablet twice daily for 7 days   Then STOP 2.  In the meantime, we will increase lacosamide to 100mg  twice daily (new prescription sent). 3.  To help with tremor, we will start primidone 50mg  at bedtime. 4.  Continue oxcarbazepine 600mg  twice daily. 5.  Follow up in 3 months.

## 2017-05-19 NOTE — Progress Notes (Signed)
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  He is accompanied by his father who provides some history.  Labs reviewed.   UPDATE: Trileptal 600mg  twice daily. Topamax 100mg  twice daily Vimpat 50mg  twice daily (ADDENDUM) He was takes clonazepam 0.5mg  for severe episodes of tremor.  Over the past few months, he has had several hospitalizations.  He was admitted to [facility] in September for severe protein calorie malnutrition with failure to thrive, acute on chronic kidney disease and orthostatic hypotension.  He was readmitted in November for acute gastroenteritis.  Tremors have gotten worse.  They did not respond to increased bromocriptine.  In December, he was again admitted to the hospital for worsening weakness, as well as worsening tremor and jerking movement.  Brain MRI revealed left hemispheric encephalomalacia but no acute findings.  EEG did not reveal any epileptiform discharges or seizures.  Suspecting polypharmacy, Linzess and Seroquel were discontinued.  Lexapro was reduced to 5mg  daily.  He still has tremors.  His appetite continues to be poor and he has lost weight.  He is weak.    HISTORY: On 08/12/13, he had a seizure while in a convenience store.  He fell, hitting his head on the pavement.  He was brought to the ED at [facility].  CT of the head showed a large left subdural hematoma with subarachnoid blood over the left convexity with a 12 mm left to right shift.  He underwent emergent craniotomy.  He was in an induced coma for 4-5 weeks.  His hospital course was complicated by development of a DVT secondary to PICC, requiring heparin drip, VDRF status post extubation, and pneumonia.  He is still taking Xarelto.  He had a G-tube placed for nutrition.  He  followed up with an neurologist, Dr. Caswell Corwin.  He was placed on Zoloft for agitation and outbursts.  He presented to the ED at [facility] on 12/06/13 for new shaking episodes with extreme agitation and combativeness.  It seemed to have occurred after taking a dose of risperidone.  CT of the head from 12/06/13, showed post-surgical changes, with interval clearing of left extra-axial fluid collection, mild increase in ventricular size (left greater than right), hypodense areas involving the left cerebrum and no midline shift or hemorrhage.  He was found to have HCAP and sepsis and was treated with antibiotics.     He resides at [facility].  He has regained much more strength in the right upper extremity.  His mood is stable.  Recently, his PEG and trach was removed.  He has an unsteady gait but ambulates with other people around.  He has global aphasia.  He finished PT/OT and is currently still undergoing speech therapy.  He has had no further seizures.  He denies headache   He has a history of alcoholism.  Prior to the accident, he had two other seizures over a 5-6 year period.  It was attributed to his drinking.  He was never taking an antiepileptic medication.  He has a tenth grade education.  Prior to the accident, he had his own apartment.  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   Prior antiepileptic medications:  Depakote (tremor), Keppra (irritability), Lamictal (rash)  PAST MEDICAL HISTORY: Past Medical History:  Diagnosis Date  . Cirrhosis (HCC)   . Depression   . DVT (deep venous thrombosis) (HCC)    UE, LE DVT during hospitalization  . GERD (gastroesophageal reflux disease)   . H/O ETOH abuse   . H/O renal calculi   . Seizure (HCC)   . TBI (traumatic brain injury) (HCC)    07/2013, fall from standing post seizure    MEDICATIONS: Current Outpatient Medications on File Prior to Visit  Medication Sig Dispense  Refill  . amantadine (SYMMETREL) 100 MG capsule TAKE ONE CAPSULE BY MOUTH TWICE A DAY    . bromocriptine (PARLODEL) 2.5 MG tablet Take 5 mg by mouth 2 (two) times daily.    . clonazePAM (KLONOPIN) 1 MG tablet Take 1 mg by mouth at bedtime.  1  . escitalopram (LEXAPRO) 5 MG tablet Take 1 tablet (5 mg total) by mouth daily. 30 tablet 0  . feeding supplement, ENSURE ENLIVE, (ENSURE ENLIVE) LIQD Take 237 mLs 3 (three) times daily between meals by mouth. 237 mL 12  . ondansetron (ZOFRAN ODT) 8 MG disintegrating tablet Take 1 tablet (8 mg total) every 8 (eight) hours as needed by mouth for nausea or vomiting. 20 tablet 0  . oxcarbazepine (TRILEPTAL) 600 MG tablet TAKE 1 TABLET BY MOUTH TWICE A DAY 60 tablet 5  . pantoprazole (PROTONIX) 40 MG tablet Take 40 mg by mouth daily.     . polyethylene glycol (MIRALAX / GLYCOLAX) packet Take 17 g by mouth daily as needed for moderate constipation.    . [DISCONTINUED] LamoTRIgine XR 200 MG TB24 Take 1 tablet (200 mg total) by mouth daily. 30 tablet 1   No current facility-administered medications on file prior to visit.     ALLERGIES: Allergies  Allergen Reactions  . Ativan [Lorazepam] Other (See Comments)    Pt has paradoxical effect with sedating medications.   . Lactulose Hives    FAMILY HISTORY: Family History  Problem Relation Age of Onset  . Hypercholesterolemia Mother   . Hypertension Mother   . Congestive Heart Failure Brother        Deceased age 49    SOCIAL HISTORY: Social History   Socioeconomic History  . Marital status: Single    Spouse name: Not on file  . Number of children: Not on file  . Years of education: Not on file  . Highest education level: Not on file  Social Needs  . Financial resource strain: Not on file  . Food insecurity - worry: Not on file  . Food insecurity - inability: Not on file  . Transportation needs - medical: Not on file  . Transportation needs - non-medical: Not on file  Occupational History  .  Not on file  Tobacco Use  . Smoking status: Former Smoker    Packs/day: 0.25    Years: 20.00    Pack years: 5.00    Types: Cigarettes    Last attempt to quit: 04/17/2017    Years since quitting: 0.0  . Smokeless tobacco: Never Used  Substance and Sexual Activity  . Alcohol use: Yes    Alcohol/week: 0.0 oz  . Drug use: No  . Sexual activity: Not on file  Other Topics Concern  . Not on file  Social History Narrative  . Not on file    REVIEW OF SYSTEMS: Constitutional: No fevers, chills, or sweats, no generalized fatigue, change in appetite Eyes: No visual changes, double vision, eye pain Ear, nose and throat: No hearing loss, ear pain, nasal  congestion, sore throat Cardiovascular: No chest pain, palpitations Respiratory:  No shortness of breath at rest or with exertion, wheezes GastrointestinaI: No nausea, vomiting, diarrhea, abdominal pain, fecal incontinence Genitourinary:  No dysuria, urinary retention or frequency Musculoskeletal:  No neck pain, back pain Integumentary: No rash, pruritus, skin lesions Neurological: as above Psychiatric: No depression, insomnia, anxiety Endocrine: No palpitations, fatigue, diaphoresis, mood swings, change in appetite, change in weight, increased thirst Hematologic/Lymphatic:  No purpura, petechiae. Allergic/Immunologic: no itchy/runny eyes, nasal congestion, recent allergic reactions, rashes  PHYSICAL EXAM: Vitals:   05/19/17 1114  BP: (!) 144/88  Pulse: (!) 111  SpO2: (!) 88%   General: No acute distress.  Patient appears well-groomed.  thin body habitus. Head:  Normocephalic/atraumatic Eyes:  Fundi examined but not visualized Neck: supple, no paraspinal tenderness, full range of motion Heart:  Regular rate and rhythm Lungs:  Clear to auscultation bilaterally Back: No paraspinal tenderness Neurological Exam: alert, unable to assess orientation, recent and remote memory, fund of knowledge, attention and concentration. Speech fluent  and not dysarthric, but unintelligible.  Expressive more than receptive aphasia.  Unable to name or repeat.  Follows some simple commands but not others.  Unable to write.  Right pupil 0.9mm larger than left pupil but both round and reactive to light.  Unable to assess CN V.  Blinks to threat bilaterally.  Otherwise, CN II-XII intact.  mild increased tone in right upper extremity, no fasciculations.  Motor 5-/5 on the right upper and lower extremities.  5/5 left side.  Unable to assess sensation.  DTR 3+ right upper and lower extremities.  2+ on left.  Postural and kinetic tremor of upper extremities.  Wide-based gait.  IMPRESSION: 1.  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  Tremor.  It did not improve with bromocriptine, topiramate or discontinuation of Depakote or other neuroleptic medication.  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  The topiramate may be contributing to decreased appetite and weight loss.  We will taper off topiramate to 50mg  twice daily for 1 week, then 25mg  twice daily for 1 week, then stop. 2.  We will increase lacosamide to 100mg  twice daily. 3.  He is to continue oxcarbazepine 600mg  twice daily. 4.  To address tremor, we will start primidone 50mg  at bedtime 5.  Follow up in 3 months.  25 minutes spent face to face with patient, over 50% spent discussing diagnosis and management.  Shon Millet, DO  CC:  Misty Stanley, [place]  Delbert Harness, MD

## 2017-05-24 ENCOUNTER — Emergency Department (HOSPITAL_COMMUNITY)
Admission: EM | Admit: 2017-05-24 | Discharge: 2017-05-25 | Disposition: A | Discharge status: Home / Self Care | Payer: Medicaid Other | Attending: Emergency Medicine | Admitting: Emergency Medicine

## 2017-05-24 ENCOUNTER — Emergency Department (HOSPITAL_COMMUNITY): Payer: Medicaid Other

## 2017-05-24 ENCOUNTER — Encounter (HOSPITAL_COMMUNITY): Payer: Self-pay | Admitting: *Deleted

## 2017-05-24 ENCOUNTER — Telehealth: Payer: Self-pay | Admitting: Neurology

## 2017-05-24 DIAGNOSIS — S069X5D Unspecified intracranial injury with loss of consciousness greater than 24 hours with return to pre-existing conscious level, subsequent encounter: Secondary | ICD-10-CM | POA: Diagnosis not present

## 2017-05-24 DIAGNOSIS — S069X9A Unspecified intracranial injury with loss of consciousness of unspecified duration, initial encounter: Secondary | ICD-10-CM | POA: Diagnosis present

## 2017-05-24 DIAGNOSIS — Z87891 Personal history of nicotine dependence: Secondary | ICD-10-CM | POA: Insufficient documentation

## 2017-05-24 DIAGNOSIS — F329 Major depressive disorder, single episode, unspecified: Secondary | ICD-10-CM | POA: Diagnosis not present

## 2017-05-24 DIAGNOSIS — Z79899 Other long term (current) drug therapy: Secondary | ICD-10-CM | POA: Diagnosis not present

## 2017-05-24 DIAGNOSIS — Z86718 Personal history of other venous thrombosis and embolism: Secondary | ICD-10-CM | POA: Insufficient documentation

## 2017-05-24 DIAGNOSIS — R451 Restlessness and agitation: Secondary | ICD-10-CM | POA: Insufficient documentation

## 2017-05-24 DIAGNOSIS — S069XAA Unspecified intracranial injury with loss of consciousness status unknown, initial encounter: Secondary | ICD-10-CM | POA: Diagnosis present

## 2017-05-24 DIAGNOSIS — R443 Hallucinations, unspecified: Secondary | ICD-10-CM | POA: Diagnosis present

## 2017-05-24 DIAGNOSIS — R413 Other amnesia: Secondary | ICD-10-CM | POA: Diagnosis not present

## 2017-05-24 HISTORY — DX: Tremor, unspecified: R25.1

## 2017-05-24 LAB — TROPONIN I: Troponin I: <0.03 ng/mL (ref <0.03)

## 2017-05-24 LAB — CBC WITH DIFFERENTIAL/PLATELET
Basophils Absolute: 0 10*3/uL (ref 0.0–0.1)
Basophils Relative: 0 %
EOS ABS: 0.2 10*3/uL (ref 0.0–0.7)
EOS PCT: 3 %
HCT: 37.1 % — ABNORMAL LOW (ref 39.0–52.0)
HEMOGLOBIN: 12.5 g/dL — AB (ref 13.0–17.0)
LYMPHS ABS: 1 10*3/uL (ref 0.7–4.0)
Lymphocytes Relative: 13 %
MCH: 32.1 pg (ref 26.0–34.0)
MCHC: 33.7 g/dL (ref 30.0–36.0)
MCV: 95.1 fL (ref 78.0–100.0)
MONOS PCT: 11 %
Monocytes Absolute: 0.8 10*3/uL (ref 0.1–1.0)
Neutro Abs: 5.3 10*3/uL (ref 1.7–7.7)
Neutrophils Relative %: 73 %
Platelets: 121 10*3/uL — ABNORMAL LOW (ref 150–400)
RBC: 3.9 MIL/uL — ABNORMAL LOW (ref 4.22–5.81)
RDW: 12.4 % (ref 11.5–15.5)
WBC: 7.3 10*3/uL (ref 4.0–10.5)

## 2017-05-24 LAB — COMPREHENSIVE METABOLIC PANEL
ALK PHOS: 144 U/L — AB (ref 38–126)
ALT: 19 U/L (ref 17–63)
AST: 21 U/L (ref 15–41)
Albumin: 4.2 g/dL (ref 3.5–5.0)
Anion gap: 7 (ref 5–15)
BILIRUBIN TOTAL: 0.3 mg/dL (ref 0.3–1.2)
BUN: 18 mg/dL (ref 6–20)
CALCIUM: 10 mg/dL (ref 8.9–10.3)
CO2: 25 mmol/L (ref 22–32)
Chloride: 109 mmol/L (ref 101–111)
Creatinine, Ser: 1.31 mg/dL — ABNORMAL HIGH (ref 0.61–1.24)
GFR calc Af Amer: >60 mL/min (ref >60)
Glucose, Bld: 104 mg/dL — ABNORMAL HIGH (ref 65–99)
Potassium: 4 mmol/L (ref 3.5–5.1)
Sodium: 141 mmol/L (ref 135–145)
TOTAL PROTEIN: 8.3 g/dL — AB (ref 6.5–8.1)

## 2017-05-24 LAB — SALICYLATE LEVEL

## 2017-05-24 LAB — URINALYSIS, ROUTINE W REFLEX MICROSCOPIC
Bilirubin Urine: NEGATIVE
GLUCOSE, UA: NEGATIVE mg/dL
HGB URINE DIPSTICK: NEGATIVE
Ketones, ur: NEGATIVE mg/dL
Leukocytes, UA: NEGATIVE
Nitrite: NEGATIVE
PH: 5 (ref 5.0–8.0)
Protein, ur: NEGATIVE mg/dL
SPECIFIC GRAVITY, URINE: 1.013 (ref 1.005–1.030)

## 2017-05-24 LAB — RAPID URINE DRUG SCREEN, HOSP PERFORMED
AMPHETAMINES: NOT DETECTED
BARBITURATES: POSITIVE — AB
Benzodiazepines: NOT DETECTED
Cocaine: NOT DETECTED
Opiates: NOT DETECTED
TETRAHYDROCANNABINOL: NOT DETECTED

## 2017-05-24 LAB — ETHANOL

## 2017-05-24 LAB — LACTIC ACID, PLASMA: LACTIC ACID, VENOUS: 0.9 mmol/L (ref 0.5–1.9)

## 2017-05-24 LAB — ACETAMINOPHEN LEVEL

## 2017-05-24 MED ORDER — QUETIAPINE FUMARATE 25 MG PO TABS
25.0000 mg | ORAL_TABLET | Freq: Two times a day (BID) | ORAL | Status: DC
  Administered 2017-05-24 – 2017-05-25 (×2): 25 mg via ORAL
  Filled 2017-05-24 (×3): qty 1

## 2017-05-24 MED ORDER — BROMOCRIPTINE MESYLATE 2.5 MG PO TABS
5.0000 mg | ORAL_TABLET | Freq: Two times a day (BID) | ORAL | Status: DC
  Administered 2017-05-24 – 2017-05-25 (×2): 5 mg via ORAL
  Filled 2017-05-24 (×2): qty 2

## 2017-05-24 MED ORDER — AMANTADINE HCL 100 MG PO CAPS
100.0000 mg | ORAL_CAPSULE | Freq: Two times a day (BID) | ORAL | Status: DC
  Administered 2017-05-24 – 2017-05-25 (×3): 100 mg via ORAL
  Filled 2017-05-24 (×3): qty 1

## 2017-05-24 MED ORDER — TOPIRAMATE 25 MG PO TABS
50.0000 mg | ORAL_TABLET | Freq: Two times a day (BID) | ORAL | Status: DC
  Administered 2017-05-24 – 2017-05-25 (×3): 50 mg via ORAL
  Filled 2017-05-24 (×3): qty 2

## 2017-05-24 MED ORDER — CLONAZEPAM 1 MG PO TABS
1.0000 mg | ORAL_TABLET | Freq: Once | ORAL | Status: AC
  Administered 2017-05-24: 1 mg via ORAL
  Filled 2017-05-24: qty 1

## 2017-05-24 MED ORDER — POLYETHYLENE GLYCOL 3350 17 G PO PACK
17.0000 g | PACK | Freq: Every day | ORAL | Status: DC | PRN
  Filled 2017-05-24: qty 1

## 2017-05-24 MED ORDER — CLONAZEPAM 1 MG PO TABS
1.0000 mg | ORAL_TABLET | Freq: Every day | ORAL | Status: DC
  Administered 2017-05-24: 1 mg via ORAL
  Filled 2017-05-24: qty 1

## 2017-05-24 MED ORDER — CLINDAMYCIN HCL 150 MG PO CAPS
150.0000 mg | ORAL_CAPSULE | Freq: Every day | ORAL | Status: DC
  Administered 2017-05-24 – 2017-05-25 (×2): 150 mg via ORAL
  Filled 2017-05-24 (×2): qty 1

## 2017-05-24 MED ORDER — ONDANSETRON 8 MG PO TBDP: 8.0000 mg | ORAL_TABLET | Freq: Three times a day (TID) | ORAL | Status: DC | PRN

## 2017-05-24 MED ORDER — LACOSAMIDE 50 MG PO TABS
100.0000 mg | ORAL_TABLET | Freq: Two times a day (BID) | ORAL | Status: DC
  Administered 2017-05-24 – 2017-05-25 (×2): 100 mg via ORAL
  Filled 2017-05-24 (×2): qty 2

## 2017-05-24 MED ORDER — SODIUM CHLORIDE 0.9 % IV BOLUS (SEPSIS)
1000.0000 mL | Freq: Once | INTRAVENOUS | Status: AC
  Administered 2017-05-24: 1000 mL via INTRAVENOUS

## 2017-05-24 MED ORDER — OXCARBAZEPINE 300 MG PO TABS
600.0000 mg | ORAL_TABLET | Freq: Two times a day (BID) | ORAL | Status: DC
  Administered 2017-05-24 – 2017-05-25 (×3): 600 mg via ORAL
  Filled 2017-05-24 (×3): qty 2

## 2017-05-24 MED ORDER — PANTOPRAZOLE SODIUM 40 MG PO TBEC
40.0000 mg | DELAYED_RELEASE_TABLET | Freq: Every day | ORAL | Status: DC
  Administered 2017-05-24 – 2017-05-25 (×2): 40 mg via ORAL
  Filled 2017-05-24 (×2): qty 1

## 2017-05-24 MED ORDER — ESCITALOPRAM OXALATE 10 MG PO TABS
5.0000 mg | ORAL_TABLET | Freq: Every day | ORAL | Status: DC
  Administered 2017-05-24 – 2017-05-25 (×2): 5 mg via ORAL
  Filled 2017-05-24 (×2): qty 1

## 2017-05-24 MED ORDER — ENSURE ENLIVE PO LIQD
237.0000 mL | Freq: Three times a day (TID) | ORAL | Status: DC
  Administered 2017-05-24: 237 mL via ORAL
  Filled 2017-05-24 (×5): qty 237

## 2017-05-24 NOTE — ED Notes (Signed)
Pt up in hallway. Walked back to bed and verbally redirected.

## 2017-05-24 NOTE — ED Notes (Addendum)
Patient sitting up in bed watching television. Pt pleasant and cooperative with care. Pt able to voice yes or no and nods his head to communicate. Warm blankets offered and given upon request. No distress noted. Sitter at bedside for safety.

## 2017-05-24 NOTE — Telephone Encounter (Signed)
Dr Everlena CooperJaffe spoke with ER staff.

## 2017-05-24 NOTE — ED Triage Notes (Signed)
Patient lives at home with his father, hx of TBI from several years ago, "nonverbal unless in pain"; father sts he was pulling at things in the air and picking at his skin, pt gets agitated.  Medication change a few days ago, increased per ems.  cbg 106

## 2017-05-24 NOTE — Telephone Encounter (Signed)
Pt's father called and said pt is hallucinating and would like a call back, said there was a medication change

## 2017-05-24 NOTE — ED Notes (Signed)
Bed: WA31 Expected date:  Expected time:  Means of arrival:  Comments: EMS-hallucinations 

## 2017-05-24 NOTE — ED Notes (Signed)
PATIENTS FATHER Mr. Hillery JacksHargett [phone] EDP pls call with update

## 2017-05-24 NOTE — ED Notes (Signed)
Pt in CT.

## 2017-05-24 NOTE — BH Assessment (Signed)
Assessment Note  Hector [name] is an 47 y.o. male who came to [facility] by father after having increased agitation and apparent visual hallucinations per dad's report. Pt is non verbal due to a TBI that he sustained 4 years ago. Pt had a seizure in a convenience store and hit his head causing him to be in an induced coma for 3 months. Pt has not been able to speak since this event and his father has been taking care of his daily needs. Pt has noticeable tremors and is extremely unsteady. Pt has a history of alcoholism but has not drank in over a year per Dad's report. Dad states that he does not have any psychiatric history prior to the TBI. On assessment pt does appear to be picking at his arms and in the air as if he sees something that isn't there. He has been calm and cooperative for staff and has not shown any agitation. Dad states that his seroquel was discontinued when he was released from the hospital a couple of weeks ago which could be contributing to the symptoms. Elta Guadeloupe NP will make medication adjustments as needed and observe overnight for a reevaluation by psychiatry in the AM. Pt does not appear to be at risk of hurting himself or others and has no history of this.   Diagnosis: TBI, Aphasia   Past Medical History:  Past Medical History:  Diagnosis Date  . Cirrhosis (HCC)   . Depression   . DVT (deep venous thrombosis) (HCC)    UE, LE DVT during hospitalization  . GERD (gastroesophageal reflux disease)   . H/O ETOH abuse   . H/O renal calculi   . Seizure (HCC)   . TBI (traumatic brain injury) (HCC)    07/2013, fall from standing post seizure  . Tremor     Past Surgical History:  Procedure Laterality Date  . BRAIN SURGERY    . GASTROSTOMY TUBE PLACEMENT    . RADIOLOGY WITH ANESTHESIA N/A 04/20/2017   Procedure: MRI WITH ANESTHESIA OF THE BRAIN;  Surgeon: Radiologist, Medication, MD;  Location: [facility] OR;  Service: Radiology;  Laterality: N/A;  . REMOVAL OF GASTROSTOMY TUBE     . TRACHEOSTOMY     feinstein    Family History:  Family History  Problem Relation Age of Onset  . Hypercholesterolemia Mother   . Hypertension Mother   . Congestive Heart Failure Brother        Deceased age 34    Social History:  reports that he quit smoking about 5 weeks ago. His smoking use included cigarettes. He has a 5.00 pack-year smoking history. he has never used smokeless tobacco. He reports that he drinks alcohol. He reports that he does not use drugs.  Additional Social History:  Alcohol / Drug Use History of alcohol / drug use?: Yes Substance #1 Name of Substance 1: Alcohol  1 - Age of First Use: unknown 1 - Frequency: pt was a chronic alcoholic 1 - Last Use / Amount: 1 year ago   CIWA: CIWA-Ar BP: 106/77 Pulse Rate: 70 COWS:    Allergies:  Allergies  Allergen Reactions  . Ativan [Lorazepam] Other (See Comments)    Pt has paradoxical effect with sedating medications.   . Lactulose Hives    Home Medications:  (Not in a hospital admission)  OB/GYN Status:  No LMP for male patient.  General Assessment Data Location of Assessment: [facility] ED TTS Assessment: In system Is this a Tele or Face-to-Face Assessment?: Face-to-Face  Is this an Initial Assessment or a Re-assessment for this encounter?: Initial Assessment Marital status: Single Is patient pregnant?: No Pregnancy Status: No Living Arrangements: Parent Can pt return to current living arrangement?: No Admission Status: Voluntary Is patient capable of signing voluntary admission?: No Referral Source: Self/Family/Friend Insurance type: medicaid     Crisis Care Plan Living Arrangements: Parent Legal Guardian: Other:(possibly father) Name of Psychiatrist: None- pt has neurologist Name of Therapist: None  Education Status Is patient currently in school?: No  Risk to self with the past 6 months Suicidal Ideation: No Has patient been a risk to self within the past 6 months prior to admission? :  No Suicidal Intent: No Has patient had any suicidal intent within the past 6 months prior to admission? : No Is patient at risk for suicide?: No Suicidal Plan?: No Has patient had any suicidal plan within the past 6 months prior to admission? : No Access to Means: No What has been your use of drugs/alcohol within the last 12 months?: none Previous Attempts/Gestures: No How many times?: 0 Other Self Harm Risks: none Triggers for Past Attempts: None known Intentional Self Injurious Behavior: None Family Suicide History: No Recent stressful life event(s): Recent negative physical changes, Trauma (Comment) Persecutory voices/beliefs?: No Depression: Yes Substance abuse history and/or treatment for substance abuse?: Yes Suicide prevention information given to non-admitted patients: Not applicable  Risk to Others within the past 6 months Homicidal Ideation: No Does patient have any lifetime risk of violence toward others beyond the six months prior to admission? : No Thoughts of Harm to Others: No Current Homicidal Intent: No Current Homicidal Plan: No Access to Homicidal Means: No Identified Victim: None History of harm to others?: No Assessment of Violence: None Noted Violent Behavior Description: none Does patient have access to weapons?: No Criminal Charges Pending?: No Does patient have a court date: No Is patient on probation?: No  Psychosis Hallucinations: None noted Delusions: None noted  Mental Status Report Appearance/Hygiene: Disheveled Eye Contact: Fair Motor Activity: Tremors Speech: Incoherent Level of Consciousness: Quiet/awake Mood: Depressed Affect: Appropriate to circumstance Anxiety Level: Moderate Thought Processes: Unable to Assess Judgement: Unable to Assess Orientation: Place, Person, Time, Situation Obsessive Compulsive Thoughts/Behaviors: Unable to Assess  Cognitive Functioning Concentration: Unable to Assess Memory: Recent Intact, Remote  Intact IQ: Average Insight: Unable to Assess Impulse Control: Unable to Assess Appetite: Poor Weight Loss: 0 Weight Gain: 0 Sleep: Decreased Total Hours of Sleep: (father states that he is not sleeping) Vegetative Symptoms: Staying in bed  ADLScreening [facility] Assessment Services) Patient's cognitive ability adequate to safely complete daily activities?: No Patient able to express need for assistance with ADLs?: No Independently performs ADLs?: No  Prior Inpatient Therapy Prior Inpatient Therapy: No  Prior Outpatient Therapy Prior Outpatient Therapy: No Does patient have an ACCT team?: No Does patient have Intensive In-House Services?  : No Does patient have [facility] services? : No Does patient have P4CC services?: No  ADL Screening (condition at time of admission) Patient's cognitive ability adequate to safely complete daily activities?: No Is the patient deaf or have difficulty hearing?: No Does the patient have difficulty seeing, even when wearing glasses/contacts?: No Does the patient have difficulty concentrating, remembering, or making decisions?: Yes Patient able to express need for assistance with ADLs?: No Does the patient have difficulty dressing or bathing?: Yes Independently performs ADLs?: No Communication: Needs assistance Is this a change from baseline?: Pre-admission baseline Dressing (OT): Needs assistance Is this a change from baseline?:  Pre-admission baseline Grooming: Needs assistance Is this a change from baseline?: Pre-admission baseline Feeding: Needs assistance Is this a change from baseline?: Pre-admission baseline Bathing: Needs assistance Is this a change from baseline?: Pre-admission baseline Toileting: Needs assistance Is this a change from baseline?: Pre-admission baseline In/Out Bed: Needs assistance Is this a change from baseline?: Pre-admission baseline Walks in Home: Needs assistance Is this a change from baseline?: Pre-admission  baseline Does the patient have difficulty walking or climbing stairs?: Yes Weakness of Legs: Both Weakness of Arms/Hands: Both       Abuse/Neglect Assessment (Assessment to be complete while patient is alone) Abuse/Neglect Assessment Can Be Completed: Unable to assess, patient is non-responsive or altered mental status Values / Beliefs Cultural Requests During Hospitalization: None Spiritual Requests During Hospitalization: None Consults Spiritual Care Consult Needed: No      Additional Information 1:1 In Past 12 Months?: No CIRT Risk: No Elopement Risk: No Does patient have medical clearance?: Yes     Disposition:  Disposition Initial Assessment Completed for this Encounter: Yes Disposition of Patient: Other dispositions Other disposition(s): (adjust medications and monitor overnight)  On Site Evaluation by:  Donetta Potts, LCAS  Reviewed with Physician:  Elta Guadeloupe NP   Lanice Shirts Louiza Moor 05/24/2017 2:52 PM

## 2017-05-24 NOTE — ED Notes (Signed)
Pt becoming agitated and trying to leave. Provider made aware.

## 2017-05-24 NOTE — ED Notes (Signed)
Pt trying to get out of bed. Verbally redirected.

## 2017-05-24 NOTE — ED Provider Notes (Signed)
[facility]-EMERGENCY DEPT Provider Note   CSN: [id number] Arrival date & time: 05/24/17  0947     History   Chief Complaint Chief Complaint  Patient presents with  . Hallucinations    HPI Hector [name] is a 47 y.o. male.  The history is provided by a caregiver and a relative. The history is limited by the condition of the patient (Pt non-verbal).  Pt was seen at 1020. Per pt's father: Father states pt has hx of TBI, aphasia. Pt has been getting agitated and "picking and pulling at his skin and things in the air." Pt was evaluated by his Neuro Dr. Everlena CooperJaffe on 05/19/2017 and had topamax decreased, lacosamide.increased, and primidone started. Pt's symptoms began shortly after these meds changes. No reported fevers, vomiting/diarrhea, falls.    Past Medical History:  Diagnosis Date  . Cirrhosis (HCC)   . Depression   . DVT (deep venous thrombosis) (HCC)    UE, LE DVT during hospitalization  . GERD (gastroesophageal reflux disease)   . H/O ETOH abuse   . H/O renal calculi   . Seizure (HCC)   . TBI (traumatic brain injury) (HCC)    07/2013, fall from standing post seizure  . Tremor     Patient Active Problem List   Diagnosis Date Noted  . Aspiration pneumonia due to gastric secretions (HCC)   . Hypoxia   . Coarse tremors 04/18/2017  . ARF (acute renal failure) (HCC) 04/18/2017  . Hypokalemia   . Hypomagnesemia   . Physical deconditioning   . FTT (failure to thrive) in adult 02/25/2017  . Oropharyngeal dysphagia 01/01/2017  . Orthostatic hypotension 01/01/2017  . Failure to thrive in adult 12/31/2016  . Vomiting   . Seizure (HCC) 06/01/2015  . Alcoholic cirrhosis of liver without ascites (HCC)   . Dehydration   . Encephalopathy acute 02/13/2015  . Altered mental status   . Ataxia 02/12/2015  . Localization-related symptomatic epilepsy and epileptic syndromes with complex partial seizures, not intractable, without status epilepticus (HCC) 08/16/2014   . Tobacco abuse 08/16/2014  . Acute kidney injury superimposed on CKD (HCC) 06/25/2014  . Dysphagia 06/25/2014  . Centrilobular emphysema (HCC) 01/27/2014  . Tracheostomy in place [facility](HCC) 01/27/2014  . Encounter for family conference with patient present 01/23/2014  . Dvt femoral (deep venous thrombosis) (HCC) 01/05/2014  . Protein-calorie malnutrition, severe (HCC) 01/05/2014  . Alcohol abuse 01/05/2014  . Acute respiratory failure with hypoxia (HCC) 12/20/2013  . Delirium due to general medical condition 12/13/2013  . HCAP (healthcare-associated pneumonia) 12/13/2013  . SIRS (systemic inflammatory response syndrome) (HCC) 12/13/2013  . TBI (traumatic brain injury) (HCC) 12/12/2013    Past Surgical History:  Procedure Laterality Date  . BRAIN SURGERY    . GASTROSTOMY TUBE PLACEMENT    . RADIOLOGY WITH ANESTHESIA N/A 04/20/2017   Procedure: MRI WITH ANESTHESIA OF THE BRAIN;  Surgeon: Radiologist, Medication, MD;  Location: [facility] OR;  Service: Radiology;  Laterality: N/A;  . REMOVAL OF GASTROSTOMY TUBE    . TRACHEOSTOMY     feinstein       Home Medications    Prior to Admission medications   Medication Sig Start Date End Date Taking? Authorizing Provider  amantadine (SYMMETREL) 100 MG capsule TAKE ONE CAPSULE BY MOUTH TWICE A DAY 01/06/17   [provider]  bromocriptine (PARLODEL) 2.5 MG tablet Take 5 mg by mouth 2 (two) times daily.    [provider]  clonazePAM (KLONOPIN) 1 MG tablet Take 1 mg  by mouth at bedtime. 12/24/16   [provider]  escitalopram (LEXAPRO) 5 MG tablet Take 1 tablet (5 mg total) by mouth daily. 04/30/17   Maxie Barb, MD  feeding supplement, ENSURE ENLIVE, (ENSURE ENLIVE) LIQD Take 237 mLs 3 (three) times daily between meals by mouth. 02/28/17   Vassie Loll, MD  Lacosamide 100 MG TABS Take 1 tablet (100 mg total) by mouth 2 (two) times daily. 05/19/17   Everlena Cooper, Adam R, DO  ondansetron (ZOFRAN ODT) 8 MG disintegrating  tablet Take 1 tablet (8 mg total) every 8 (eight) hours as needed by mouth for nausea or vomiting. 02/28/17   Vassie Loll, MD  oxcarbazepine (TRILEPTAL) 600 MG tablet TAKE 1 TABLET BY MOUTH TWICE A DAY 05/03/17   Jaffe, Adam R, DO  pantoprazole (PROTONIX) 40 MG tablet Take 40 mg by mouth daily.     [provider]  polyethylene glycol (MIRALAX / GLYCOLAX) packet Take 17 g by mouth daily as needed for moderate constipation.    [provider]  primidone (MYSOLINE) 50 MG tablet Take 1 tablet (50 mg total) by mouth at bedtime. 05/19/17   Drema Dallas, DO  topiramate (TOPAMAX) 25 MG tablet Take 2 tablets twice daily for 7 days, then 1 tablet twice daily for 7 days, then STOP 05/19/17   Everlena Cooper, Adam R, DO  LamoTRIgine XR 200 MG TB24 Take 1 tablet (200 mg total) by mouth daily. 03/18/15 04/10/15  Drema Dallas, DO    Family History Family History  Problem Relation Age of Onset  . Hypercholesterolemia Mother   . Hypertension Mother   . Congestive Heart Failure Brother        Deceased age 53    Social History Social History   Tobacco Use  . Smoking status: Former Smoker    Packs/day: 0.25    Years: 20.00    Pack years: 5.00    Types: Cigarettes    Last attempt to quit: 04/17/2017    Years since quitting: 0.1  . Smokeless tobacco: Never Used  Substance Use Topics  . Alcohol use: Yes    Alcohol/week: 0.0 oz  . Drug use: No     Allergies   Ativan [lorazepam] and Lactulose   Review of Systems Review of Systems  Unable to perform ROS: Patient nonverbal     Physical Exam Updated Vital Signs BP 106/77   Pulse 70   Temp 98.3 F (36.8 C) (Axillary)   Resp 18   SpO2 100%   Physical Exam 1025: Physical examination:  Nursing notes reviewed; Vital signs and O2 SAT reviewed;  Constitutional: Well developed, Well nourished, Well hydrated, In no acute distress; Head:  Normocephalic, atraumatic; Eyes: EOMI, PERRL, No scleral icterus; ENMT: Mouth and pharynx normal,  Mucous membranes moist; Neck: Supple, Full range of motion, No lymphadenopathy; Cardiovascular: Regular rate and rhythm, No gallop; Respiratory: Breath sounds clear & equal bilaterally, No wheezes. Normal respiratory effort/excursion; Chest: Nontender, Movement normal; Abdomen: Soft, Nontender, Nondistended, Normal bowel sounds; Genitourinary: No CVA tenderness; Extremities: Pulses normal, No tenderness, No edema, No calf edema or asymmetry.; Neuro: Awake, alert, aphasic per hx. No facial droop. Pt moves all extremities spontaneously on stretcher without apparent gross focal motor deficits.; Skin: Color normal, Warm, Dry.; Psych:  Affect flat, calm/cooperative.    ED Treatments / Results  Labs (all labs ordered are listed, but only abnormal results are displayed)   EKG  EKG Interpretation None       Radiology   Procedures  Procedures (including critical care time)  Medications Ordered in ED Medications  sodium chloride 0.9 % bolus 1,000 mL (not administered)     Initial Impression / Assessment and Plan / ED Course  I have reviewed the triage vital signs and the nursing notes.  Pertinent labs & imaging results that were available during my care of the patient were reviewed by me and considered in my medical decision making (see chart for details).  MDM Reviewed: previous chart, nursing note and vitals Reviewed previous: labs and ECG Interpretation: labs, ECG, x-ray and CT scan    ED ECG REPORT   Date: 05/24/2017  Rate: 71  Rhythm: normal sinus rhythm  QRS Axis: normal  Intervals: normal  ST/T Wave abnormalities: normal  Conduction Disutrbances:none  Narrative Interpretation:   Old EKG Reviewed: unchanged; no significant changes from previous EKG dated 02/28/2017. I have personally reviewed the EKG tracing and agree with the computerized printout as noted.   Results for orders placed or performed during the hospital encounter of 05/24/17  Comprehensive metabolic  panel  Result Value Ref Range   Sodium 141 135 - 145 mmol/L   Potassium 4.0 3.5 - 5.1 mmol/L   Chloride 109 101 - 111 mmol/L   CO2 25 22 - 32 mmol/L   Glucose, Bld 104 (H) 65 - 99 mg/dL   BUN 18 6 - 20 mg/dL   Creatinine, Ser 5.40 (H) 0.61 - 1.24 mg/dL   Calcium 98.1 8.9 - 19.1 mg/dL   Total Protein 8.3 (H) 6.5 - 8.1 g/dL   Albumin 4.2 3.5 - 5.0 g/dL   AST 21 15 - 41 U/L   ALT 19 17 - 63 U/L   Alkaline Phosphatase 144 (H) 38 - 126 U/L   Total Bilirubin 0.3 0.3 - 1.2 mg/dL   GFR calc non Af Amer >60 >60 mL/min   GFR calc Af Amer >60 >60 mL/min   Anion gap 7 5 - 15  Ethanol  Result Value Ref Range   Alcohol, Ethyl (B) <10 <10 mg/dL  CBC with Differential  Result Value Ref Range   WBC 7.3 4.0 - 10.5 K/uL   RBC 3.90 (L) 4.22 - 5.81 MIL/uL   Hemoglobin 12.5 (L) 13.0 - 17.0 g/dL   HCT 47.8 (L) 29.5 - 62.1 %   MCV 95.1 78.0 - 100.0 fL   MCH 32.1 26.0 - 34.0 pg   MCHC 33.7 30.0 - 36.0 g/dL   RDW 30.8 65.7 - 84.6 %   Platelets 121 (L) 150 - 400 K/uL   Neutrophils Relative % 73 %   Neutro Abs 5.3 1.7 - 7.7 K/uL   Lymphocytes Relative 13 %   Lymphs Abs 1.0 0.7 - 4.0 K/uL   Monocytes Relative 11 %   Monocytes Absolute 0.8 0.1 - 1.0 K/uL   Eosinophils Relative 3 %   Eosinophils Absolute 0.2 0.0 - 0.7 K/uL   Basophils Relative 0 %   Basophils Absolute 0.0 0.0 - 0.1 K/uL  Lactic acid, plasma  Result Value Ref Range   Lactic Acid, Venous 0.9 0.5 - 1.9 mmol/L  Urinalysis, Routine w reflex microscopic  Result Value Ref Range   Color, Urine YELLOW YELLOW   APPearance CLEAR CLEAR   Specific Gravity, Urine 1.013 1.005 - 1.030   pH 5.0 5.0 - 8.0   Glucose, UA NEGATIVE NEGATIVE mg/dL   Hgb urine dipstick NEGATIVE NEGATIVE   Bilirubin Urine NEGATIVE NEGATIVE   Ketones, ur NEGATIVE NEGATIVE mg/dL   Protein, ur NEGATIVE NEGATIVE  mg/dL   Nitrite NEGATIVE NEGATIVE   Leukocytes, UA NEGATIVE NEGATIVE  Urine rapid drug screen (hosp performed)  Result Value Ref Range   Opiates NONE  DETECTED NONE DETECTED   Cocaine NONE DETECTED NONE DETECTED   Benzodiazepines NONE DETECTED NONE DETECTED   Amphetamines NONE DETECTED NONE DETECTED   Tetrahydrocannabinol NONE DETECTED NONE DETECTED   Barbiturates POSITIVE (A) NONE DETECTED  Acetaminophen level  Result Value Ref Range   Acetaminophen (Tylenol), Serum <10 (L) 10 - 30 ug/mL  Salicylate level  Result Value Ref Range   Salicylate Lvl <7.0 2.8 - 30.0 mg/dL  Troponin I  Result Value Ref Range   Troponin I <0.03 <0.03 ng/mL   Dg Chest 2 View Result Date: 05/24/2017 CLINICAL DATA:  Altered mental status.  NG taken.  Hallucinations. EXAM: CHEST  2 VIEW COMPARISON:  04/25/2017 and 06/01/2015 FINDINGS: The heart size and mediastinal contours are within normal limits. Both lungs are clear. Old compression fracture in the midthoracic spine. IMPRESSION: No active cardiopulmonary disease. Electronically Signed   By: Francene Boyers M.D.   On: 05/24/2017 11:19   Ct Head Wo Contrast Result Date: 05/24/2017 CLINICAL DATA:  Personal history of traumatic brain injury. New onset of hallucinations and agitation. EXAM: CT HEAD WITHOUT CONTRAST TECHNIQUE: Contiguous axial images were obtained from the base of the skull through the vertex without intravenous contrast. COMPARISON:  CT head without contrast 04/17/2017. MRI brain 04/20/2012. FINDINGS: Brain: Chronic left temporal, frontal, and parietal encephalomalacia is stable. Associated ex vacuo dilation of the left lateral ventricle is noted. Chronic encephalomalacia is again noted in the anterior inferior frontal lobes bilaterally. No acute infarct, hemorrhage, or mass lesion is present. The right hemisphere is stable. The brainstem and cerebellum are within normal limits. Vascular: No hyperdense vessel or unexpected calcification. Skull: A left frontal craniotomy is again noted. The calvarium is otherwise intact. No acute or healing fractures are present. No focal lytic or blastic lesions are  present. Sinuses/Orbits: Chronic right maxillary sinus is again noted. The paranasal sinuses and mastoid air cells are otherwise clear. Globes and orbits are within normal limits. IMPRESSION: 1. Stable chronic left sided encephalomalacia. 2. Stable chronic anterior inferior frontal lobe encephalomalacia bilaterally. 3. No acute intracranial abnormality or significant interval change. 4. Chronic right maxillary sinus disease. Electronically Signed   By: Marin Roberts M.D.   On: 05/24/2017 11:14    1315:  BUN/Cr mildly elevated; will give IVF.  No clear indication for medical admission at this time. T/C to [facility] Neuro Dr. Everlena Cooper, case discussed, including:  HPI, pertinent PM/SHx, VS/PE, dx testing, ED course and treatment:  States to stop primidone, requests to have Psych evaluate pt because pt was taken off a lot of his psych meds during his last admit in 03/2017, pt may need some psych meds started/re-started.  Will have TTS evaluate.   1445:  Psych as evaluated pt and spoken with pt's father: Psych will re-start pt on seroquel, observe overnight, and re-eval tomorrow. Pt's father is agreeable with this plan and does wish to take him back home (vs SNF/etc). Holding orders written.               Final Clinical Impressions(s) / ED Diagnoses   Final diagnoses:  None    ED Discharge Orders    None        Samuel Jester, DO 05/24/17 1452

## 2017-05-25 ENCOUNTER — Ambulatory Visit: Payer: Self-pay | Admitting: Physical Medicine & Rehabilitation

## 2017-05-25 DIAGNOSIS — Z87891 Personal history of nicotine dependence: Secondary | ICD-10-CM

## 2017-05-25 DIAGNOSIS — S069X5D Unspecified intracranial injury with loss of consciousness greater than 24 hours with return to pre-existing conscious level, subsequent encounter: Secondary | ICD-10-CM

## 2017-05-25 DIAGNOSIS — R451 Restlessness and agitation: Secondary | ICD-10-CM | POA: Insufficient documentation

## 2017-05-25 DIAGNOSIS — R413 Other amnesia: Secondary | ICD-10-CM

## 2017-05-25 LAB — TOPIRAMATE LEVEL: Topiramate Lvl: 3 ug/mL (ref 2.0–25.0)

## 2017-05-25 MED ORDER — QUETIAPINE FUMARATE 25 MG PO TABS: 25.0000 mg | ORAL_TABLET | Freq: Two times a day (BID) | ORAL | 0 refills | Status: DC

## 2017-05-25 NOTE — [facility] Suicide Risk Assessment (Signed)
Suicide Risk Assessment  Discharge Assessment   [facility] Discharge Suicide Risk Assessment   Principal Problem: TBI (traumatic brain injury) [facility]) Discharge Diagnoses:  Patient Active Problem List   Diagnosis Date Noted  . Aspiration pneumonia due to gastric secretions (HCC) [J69.0]   . Hypoxia [R09.02]   . Coarse tremors [G25.2] 04/18/2017  . ARF (acute renal failure) (HCC) [N17.9] 04/18/2017  . Hypokalemia [E87.6]   . Hypomagnesemia [E83.42]   . Physical deconditioning [R53.81]   . FTT (failure to thrive) in adult [R62.7] 02/25/2017  . Oropharyngeal dysphagia [R13.12] 01/01/2017  . Orthostatic hypotension [I95.1] 01/01/2017  . Failure to thrive in adult [R62.7] 12/31/2016  . Vomiting [R11.10]   . Seizure (HCC) [R56.9] 06/01/2015  . 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 Dehydration [E86.0]   . Encephalopathy acute [G93.40] 02/13/2015  . Altered mental status [R41.82]   . Ataxia [R27.0] 02/12/2015  . Localization-related symptomatic epilepsy and epileptic syndromes with complex partial seizures, not intractable, without status epilepticus (HCC) [G40.209] 08/16/2014  . Tobacco abuse [Z72.0] 08/16/2014  . Acute kidney injury superimposed on CKD (HCC) [N17.9, N18.9] 06/25/2014  . Dysphagia [R13.10] 06/25/2014  . Centrilobular emphysema (HCC) [J43.2] 01/27/2014  . Tracheostomy in place [facility]) [Z93.0] 01/27/2014  . Encounter for family conference with patient present [Z71.89] 01/23/2014  . Dvt femoral (deep venous thrombosis) (HCC) [I82.419] 01/05/2014  . Protein-calorie malnutrition, severe (HCC) [E43] 01/05/2014  . Alcohol abuse [F10.10] 01/05/2014  . Acute respiratory failure with hypoxia (HCC) [J96.01] 12/20/2013  . Delirium due to general medical condition [F05] 12/13/2013  . HCAP (healthcare-associated pneumonia) [J18.9] 12/13/2013  . SIRS (systemic inflammatory response syndrome) (HCC) [R65.10] 12/13/2013  . TBI (traumatic brain injury) [facility]) [S06.9X9A]  12/12/2013    Total Time spent with patient: 45 minutes  Musculoskeletal: Strength & Muscle Tone: within normal limits Gait & Station: normal Patient leans: N/A  Psychiatric Specialty Exam: Physical Exam  Constitutional: He appears well-developed and well-nourished.  HENT:  Head: Normocephalic.  Respiratory: Effort normal.  Musculoskeletal: Normal range of motion.  Neurological: He is alert.  Psychiatric: Thought content normal. He is slowed. Cognition and memory are impaired. He expresses impulsivity. He exhibits a depressed mood. He is noncommunicative.   Review of Systems  Psychiatric/Behavioral: Positive for depression and memory loss. Negative for hallucinations, substance abuse and suicidal ideas. The patient is not nervous/anxious and does not have insomnia.   All other systems reviewed and are negative.  Blood pressure 104/64, pulse (!) 58, temperature 98.3 F (36.8 C), temperature source Oral, resp. rate 16, SpO2 94 %.There is no height or weight on file to calculate BMI. General Appearance: Casual Eye Contact:  Pt sleeping Speech:  non communicative TBI Volume:  Pt sleeping Mood:  Pt sleeping Affect:  Congruent Thought Process:  Disorganized Orientation:  Other:  person Thought Content:  Pt has TBI with severe memory loss.  Suicidal Thoughts:  No Homicidal Thoughts:  No Memory:  Immediate;   TBI Recent;   TBI Remote;   TBI Judgement:  Other:  TBI Insight:  TBI Psychomotor Activity:  Decreased Concentration:  Concentration: Pt sleeping and Attention Span: TBI Recall:  TBI Fund of Knowledge:  TBI Language:  Poor Akathisia:  No Handed:  Right AIMS (if indicated):    Assets:  Financial Resources/Insurance Housing Social Support ADL's:  Impaired Cognition:  Impaired,  Severe   Mental Status Per Nursing Assessment::   On Admission:   Agitated  Demographic Factors:  Male, Caucasian and Unemployed  Loss  Factors: Decline in physical  health  Historical Factors: Impulsivity  Risk Reduction Factors:   Living with another person, especially a relative  Continued Clinical Symptoms:  Depression:   Impulsivity Alcohol/Substance Abuse/Dependencies Epilepsy More than one psychiatric diagnosis  Cognitive Features That Contribute To Risk:  Loss of executive function    Suicide Risk:  Minimal: No identifiable suicidal ideation.  Patients presenting with no risk factors but with morbid ruminations; may be classified as minimal risk based on the severity of the depressive symptoms    Plan Of Care/Follow-up recommendations:  Activity:  as tolerated Diet:  Heart Healthy  Laveda AbbeLaurie Britton Aditi Rovira, NP 05/25/2017, 11:11 AM

## 2017-05-25 NOTE — Discharge Instructions (Signed)
For your mental health needs, you are advised to follow up with [facility].  New and returning patients are seen at their walk-in clinic.  Walk-in hours are Monday - Friday from 8:00 am - 3:00 pm.  Walk-in patients are seen on a first come, first served basis.  Try to arrive as early as possible for he best chance of being seen the same day: ° °     [facility] °     [address] °     [place], [name] °     [phone] °

## 2017-05-25 NOTE — [facility] Assessment (Signed)
[facility] Assessment Progress Note  Per Jacqueline Norman, DO, this pt does not require psychiatric hospitalization at this time.  Pt is to be discharged from [facility] with recommendation to follow up with [facility].  This has been included in pt's discharge instructions.  Pt's nurse, Randi, has been notified.  Quinten Allerton, MA Triage Specialist [phone]     

## 2017-05-25 NOTE — Consult Note (Signed)
[facility] Consult   Reason for Consult:  Agitation Referring Physician:  EDP Patient Identification: CODEN FRANCHI MRN:  [id number] Principal Diagnosis: TBI (traumatic brain injury) [facility]) Diagnosis:   Patient Active Problem List   Diagnosis Date Noted  . Aspiration pneumonia due to gastric secretions ([place]) [J69.0]   . Hypoxia [R09.02]   . Coarse tremors [G25.2] 04/18/2017  . ARF (acute renal failure) ([place]) [N17.9] 04/18/2017  . Hypokalemia [E87.6]   . Hypomagnesemia [E83.42]   . Physical deconditioning [R53.81]   . FTT (failure to thrive) in adult [R62.7] 02/25/2017  . Oropharyngeal dysphagia [R13.12] 01/01/2017  . Orthostatic hypotension [I95.1] 01/01/2017  . Failure to thrive in adult [R62.7] 12/31/2016  . Vomiting [R11.10]   . Seizure ([employer]) [R56.9] 06/01/2015  . Alcoholic cirrhosis of liver without ascites ([place]) [K70.30]   . Dehydration [E86.0]   . Encephalopathy acute [G93.40] 02/13/2015  . Altered mental status [R41.82]   . Ataxia [R27.0] 02/12/2015  . Localization-related symptomatic epilepsy and epileptic syndromes with complex partial seizures, not intractable, without status epilepticus ([place]) [G40.209] 08/16/2014  . Tobacco abuse [Z72.0] 08/16/2014  . Acute kidney injury superimposed on CKD ([place]) [N17.9, N18.9] 06/25/2014  . Dysphagia [R13.10] 06/25/2014  . Centrilobular emphysema ([place]) [J43.2] 01/27/2014  . Tracheostomy in place [facility]) [Z93.0] 01/27/2014  . Encounter for family conference with patient present [Z71.89] 01/23/2014  . Dvt femoral (deep venous thrombosis) ([place]) [I82.419] 01/05/2014  . Protein-calorie malnutrition, severe ([place]) [E43] 01/05/2014  . Alcohol abuse [F10.10] 01/05/2014  . Acute respiratory failure with hypoxia ([place]) [J96.01] 12/20/2013  . Delirium due to general medical condition [F05] 12/13/2013  . HCAP (healthcare-associated pneumonia) [J18.9] 12/13/2013  . SIRS (systemic inflammatory response syndrome) ([facility]) [R65.10]  12/13/2013  . TBI (traumatic brain injury) [facility]) [S06.9X9A] 12/12/2013    Total Time spent with patient: 45 minutes  Subjective:   Hector [name] is a 47 y.o. male patient admitted with agitation.  HPI:  Pt was seen and chart reviewed with treatment team and Dr Mariea Clonts. Pt is non-verbal related to a traumatic brain injury he received as the result of a fall four years ago. Pt has a history of alcohol abuse but has been sober for the past year. BAL negative, UDS positive for barbiturates which are prescribed for his seizure disorder. Pt lives with his parents. Pt has been calm and cooperative in the emergency room and was observed sleeping this morning. Pt was restarted on Seroquel yesterday and his agitation has decreased. Pt is psychiatrically clear for discharge.   Past Psychiatric History: As above  Risk to Self: Suicidal Ideation: No Suicidal Intent: No Is patient at risk for suicide?: No Suicidal Plan?: No Access to Means: No What has been your use of drugs/alcohol within the last 12 months?: none How many times?: 0 Other Self Harm Risks: none Triggers for Past Attempts: None known Intentional Self Injurious Behavior: None Risk to Others: Homicidal Ideation: No Thoughts of Harm to Others: No Current Homicidal Intent: No Current Homicidal Plan: No Access to Homicidal Means: No Identified Victim: None History of harm to others?: No Assessment of Violence: None Noted Violent Behavior Description: none Does patient have access to weapons?: No Criminal Charges Pending?: No Does patient have a court date: No Prior Inpatient Therapy: Prior Inpatient Therapy: No Prior Outpatient Therapy: Prior Outpatient Therapy: No Does patient have an ACCT team?: No Does patient have Intensive In-House Services?  : No Does patient have [facility] services? : No Does patient have [facility] services?: No  Past Medical History:  Past Medical History:  Diagnosis Date  . Cirrhosis ([place])   .  Depression   . DVT (deep venous thrombosis) ([facility])    UE, LE DVT during hospitalization  . GERD (gastroesophageal reflux disease)   . H/O ETOH abuse   . H/O renal calculi   . Seizure ([place])   . TBI (traumatic brain injury) ([place])    07/2013, fall from standing post seizure  . Tremor     Past Surgical History:  Procedure Laterality Date  . BRAIN SURGERY    . GASTROSTOMY TUBE PLACEMENT    . RADIOLOGY WITH ANESTHESIA N/A 04/20/2017   Procedure: MRI WITH ANESTHESIA OF THE BRAIN;  Surgeon: Radiologist, Medication, MD;  Location: [facility];  Service: Radiology;  Laterality: N/A;  . REMOVAL OF GASTROSTOMY TUBE    . TRACHEOSTOMY     feinstein   Family History:  Family History  Problem Relation Age of Onset  . Hypercholesterolemia Mother   . Hypertension Mother   . Congestive Heart Failure Brother        Deceased age 1   Family Psychiatric  History: Unknown Social History:  Social History   Substance and Sexual Activity  Alcohol Use Yes  . Alcohol/week: 0.0 oz     Social History   Substance and Sexual Activity  Drug Use No    Social History   Socioeconomic History  . Marital status: Single    Spouse name: None  . Number of children: None  . Years of education: None  . Highest education level: None  Social Needs  . Financial resource strain: None  . Food insecurity - worry: None  . Food insecurity - inability: None  . Transportation needs - medical: None  . Transportation needs - non-medical: None  Occupational History  . None  Tobacco Use  . Smoking status: Former Smoker    Packs/day: 0.25    Years: 20.00    Pack years: 5.00    Types: Cigarettes    Last attempt to quit: 04/17/2017    Years since quitting: 0.1  . Smokeless tobacco: Never Used  Substance and Sexual Activity  . Alcohol use: Yes    Alcohol/week: 0.0 oz  . Drug use: No  . Sexual activity: None  Other Topics Concern  . None  Social History Narrative  . None   Additional Social History: N/A     Allergies:   Allergies  Allergen Reactions  . Ativan [Lorazepam] Other (See Comments)    Pt has paradoxical effect with sedating medications.   . Lactulose Hives    Labs:  Results for orders placed or performed during the hospital encounter of 05/24/17 (from the past 48 hour(s))  Comprehensive metabolic panel     Status: Abnormal   Collection Time: 05/24/17 11:23 AM  Result Value Ref Range   Sodium 141 135 - 145 mmol/L   Potassium 4.0 3.5 - 5.1 mmol/L   Chloride 109 101 - 111 mmol/L   CO2 25 22 - 32 mmol/L   Glucose, Bld 104 (H) 65 - 99 mg/dL   BUN 18 6 - 20 mg/dL   Creatinine, Ser 1.31 (H) 0.61 - 1.24 mg/dL   Calcium 10.0 8.9 - 10.3 mg/dL   Total Protein 8.3 (H) 6.5 - 8.1 g/dL   Albumin 4.2 3.5 - 5.0 g/dL   AST 21 15 - 41 U/L   ALT 19 17 - 63 U/L   Alkaline Phosphatase 144 (H) 38 - 126 U/L  Total Bilirubin 0.3 0.3 - 1.2 mg/dL   GFR calc non Af Amer >60 >60 mL/min   GFR calc Af Amer >60 >60 mL/min    Comment: (NOTE) The eGFR has been calculated using the CKD EPI equation. This calculation has not been validated in all clinical situations. eGFR's persistently <60 mL/min signify possible Chronic Kidney Disease.    Anion gap 7 5 - 15  Ethanol     Status: None   Collection Time: 05/24/17 11:23 AM  Result Value Ref Range   Alcohol, Ethyl (B) <10 <10 mg/dL    Comment:        LOWEST DETECTABLE LIMIT FOR SERUM ALCOHOL IS 10 mg/dL FOR MEDICAL PURPOSES ONLY   CBC with Differential     Status: Abnormal   Collection Time: 05/24/17 11:23 AM  Result Value Ref Range   WBC 7.3 4.0 - 10.5 K/uL   RBC 3.90 (L) 4.22 - 5.81 MIL/uL   Hemoglobin 12.5 (L) 13.0 - 17.0 g/dL   HCT 37.1 (L) 39.0 - 52.0 %   MCV 95.1 78.0 - 100.0 fL   MCH 32.1 26.0 - 34.0 pg   MCHC 33.7 30.0 - 36.0 g/dL   RDW 12.4 11.5 - 15.5 %   Platelets 121 (L) 150 - 400 K/uL   Neutrophils Relative % 73 %   Neutro Abs 5.3 1.7 - 7.7 K/uL   Lymphocytes Relative 13 %   Lymphs Abs 1.0 0.7 - 4.0 K/uL   Monocytes  Relative 11 %   Monocytes Absolute 0.8 0.1 - 1.0 K/uL   Eosinophils Relative 3 %   Eosinophils Absolute 0.2 0.0 - 0.7 K/uL   Basophils Relative 0 %   Basophils Absolute 0.0 0.0 - 0.1 K/uL  Lactic acid, plasma     Status: None   Collection Time: 05/24/17 11:23 AM  Result Value Ref Range   Lactic Acid, Venous 0.9 0.5 - 1.9 mmol/L  Urinalysis, Routine w reflex microscopic     Status: None   Collection Time: 05/24/17 11:23 AM  Result Value Ref Range   Color, Urine YELLOW YELLOW   APPearance CLEAR CLEAR   Specific Gravity, Urine 1.013 1.005 - 1.030   pH 5.0 5.0 - 8.0   Glucose, UA NEGATIVE NEGATIVE mg/dL   Hgb urine dipstick NEGATIVE NEGATIVE   Bilirubin Urine NEGATIVE NEGATIVE   Ketones, ur NEGATIVE NEGATIVE mg/dL   Protein, ur NEGATIVE NEGATIVE mg/dL   Nitrite NEGATIVE NEGATIVE   Leukocytes, UA NEGATIVE NEGATIVE  Urine rapid drug screen (hosp performed)     Status: Abnormal   Collection Time: 05/24/17 11:23 AM  Result Value Ref Range   Opiates NONE DETECTED NONE DETECTED   Cocaine NONE DETECTED NONE DETECTED   Benzodiazepines NONE DETECTED NONE DETECTED   Amphetamines NONE DETECTED NONE DETECTED   Tetrahydrocannabinol NONE DETECTED NONE DETECTED   Barbiturates POSITIVE (A) NONE DETECTED    Comment: (NOTE) DRUG SCREEN FOR MEDICAL PURPOSES ONLY.  IF CONFIRMATION IS NEEDED FOR ANY PURPOSE, NOTIFY LAB WITHIN 5 DAYS. LOWEST DETECTABLE LIMITS FOR URINE DRUG SCREEN Drug Class                     Cutoff (ng/mL) Amphetamine and metabolites    1000 Barbiturate and metabolites    200 Benzodiazepine                 858 Tricyclics and metabolites     300 Opiates and metabolites        300 Cocaine and  metabolites        300 THC                            50   Acetaminophen level     Status: Abnormal   Collection Time: 05/24/17 11:23 AM  Result Value Ref Range   Acetaminophen (Tylenol), Serum <10 (L) 10 - 30 ug/mL    Comment:        THERAPEUTIC CONCENTRATIONS VARY SIGNIFICANTLY.  A RANGE OF 10-30 ug/mL MAY BE AN EFFECTIVE CONCENTRATION FOR MANY PATIENTS. HOWEVER, SOME ARE BEST TREATED AT CONCENTRATIONS OUTSIDE THIS RANGE. ACETAMINOPHEN CONCENTRATIONS >150 ug/mL AT 4 HOURS AFTER INGESTION AND >50 ug/mL AT 12 HOURS AFTER INGESTION ARE OFTEN ASSOCIATED WITH TOXIC REACTIONS.   Salicylate level     Status: None   Collection Time: 05/24/17 11:23 AM  Result Value Ref Range   Salicylate Lvl <5.8 2.8 - 30.0 mg/dL  Troponin I     Status: None   Collection Time: 05/24/17 11:23 AM  Result Value Ref Range   Troponin I <0.03 <0.03 ng/mL    Current Facility-Administered Medications  Medication Dose Route Frequency Provider Last Rate Last Dose  . amantadine (SYMMETREL) capsule 100 mg  100 mg Oral BID Francine Graven, DO   100 mg at 05/24/17 2258  . bromocriptine (PARLODEL) tablet 5 mg  5 mg Oral BID Francine Graven, DO   5 mg at 05/24/17 2258  . clindamycin (CLEOCIN) capsule 150 mg  150 mg Oral Daily Francine Graven, DO   150 mg at 05/24/17 1712  . clonazePAM (KLONOPIN) tablet 1 mg  1 mg Oral QHS Francine Graven, DO   1 mg at 05/24/17 2258  . escitalopram (LEXAPRO) tablet 5 mg  5 mg Oral Daily Francine Graven, DO   5 mg at 05/24/17 1535  . feeding supplement (ENSURE ENLIVE) (ENSURE ENLIVE) liquid 237 mL  237 mL Oral TID BM Francine Graven, DO   237 mL at 05/24/17 1712  . lacosamide (VIMPAT) tablet 100 mg  100 mg Oral BID Francine Graven, DO   100 mg at 05/24/17 2257  . ondansetron (ZOFRAN-ODT) disintegrating tablet 8 mg  8 mg Oral Q8H PRN Francine Graven, DO      . Oxcarbazepine (TRILEPTAL) tablet 600 mg  600 mg Oral BID Francine Graven, DO   600 mg at 05/24/17 2257  . pantoprazole (PROTONIX) EC tablet 40 mg  40 mg Oral Daily Francine Graven, DO   40 mg at 05/24/17 1535  . polyethylene glycol (MIRALAX / GLYCOLAX) packet 17 g  17 g Oral Daily PRN Francine Graven, DO      . QUEtiapine (SEROQUEL) tablet 25 mg  25 mg Oral BID Ethelene Hal, NP    25 mg at 05/24/17 1536  . topiramate (TOPAMAX) tablet 50 mg  50 mg Oral BID Francine Graven, DO   50 mg at 05/24/17 2258   Current Outpatient Medications  Medication Sig Dispense Refill  . amantadine (SYMMETREL) 100 MG capsule TAKE ONE CAPSULE BY MOUTH TWICE A DAY    . bromocriptine (PARLODEL) 2.5 MG tablet Take 5 mg by mouth 2 (two) times daily.    . clindamycin (CLEOCIN) 150 MG capsule Take 150 mg by mouth daily.  3  . clonazePAM (KLONOPIN) 1 MG tablet Take 1 mg by mouth at bedtime.  1  . escitalopram (LEXAPRO) 5 MG tablet Take 1 tablet (5 mg total) by mouth daily. 30 tablet 0  . feeding  supplement, ENSURE ENLIVE, (ENSURE ENLIVE) LIQD Take 237 mLs 3 (three) times daily between meals by mouth. 237 mL 12  . Lacosamide 100 MG TABS Take 1 tablet (100 mg total) by mouth 2 (two) times daily. 60 tablet 5  . ondansetron (ZOFRAN ODT) 8 MG disintegrating tablet Take 1 tablet (8 mg total) every 8 (eight) hours as needed by mouth for nausea or vomiting. 20 tablet 0  . oxcarbazepine (TRILEPTAL) 600 MG tablet TAKE 1 TABLET BY MOUTH TWICE A DAY 60 tablet 5  . pantoprazole (PROTONIX) 40 MG tablet Take 40 mg by mouth daily.     . polyethylene glycol (MIRALAX / GLYCOLAX) packet Take 17 g by mouth daily as needed for moderate constipation.    . topiramate (TOPAMAX) 25 MG tablet Take 2 tablets twice daily for 7 days, then 1 tablet twice daily for 7 days, then STOP 42 tablet 0    Musculoskeletal: Strength & Muscle Tone: within normal limits Gait & Station: normal Patient leans: N/A  Psychiatric Specialty Exam: Physical Exam  Nursing note and vitals reviewed. Constitutional: He appears well-developed and well-nourished.  HENT:  Head: Normocephalic.  Respiratory: Effort normal.  Musculoskeletal: Normal range of motion.  Neurological:  Sleeping   Psychiatric: Thought content normal. He is slowed. Cognition and memory are impaired. He expresses impulsivity. He exhibits a depressed mood. He is  noncommunicative.    Review of Systems  Psychiatric/Behavioral: Positive for depression and memory loss. Negative for hallucinations, substance abuse and suicidal ideas. The patient is not nervous/anxious and does not have insomnia.   All other systems reviewed and are negative.   Blood pressure 104/64, pulse (!) 58, temperature 98.3 F (36.8 C), temperature source Oral, resp. rate 16, SpO2 94 %.There is no height or weight on file to calculate BMI.  General Appearance: Casual  Eye Contact:  Pt sleeping  Speech:  non communicative TBI  Volume:  Pt sleeping  Mood:  Pt sleeping  Affect:  Pt sleeping  Thought Process:  Disorganized at baseline.  Orientation:  Other:  person  Thought Content:  Pt has TBI with severe memory loss.   Suicidal Thoughts:  No  Homicidal Thoughts:  No  Memory:  Immediate;   UTA Recent;   UTA Remote;   UTA  Judgement:  Impaired  Insight:  Lacking  Psychomotor Activity:  Decreased  Concentration:  Concentration: Pt sleeping and Attention Span: UTA  Recall:  [place] of Knowledge:  UTA  Language:  Poor  Akathisia:  No  Handed:  Right  AIMS (if indicated):   N/A  Assets:  Financial Resources/Insurance Housing Social Support  ADL's:  Impaired  Cognition:  Impaired,  Severe  Sleep:   N/A     Treatment Plan Summary: Plan Traumatic Brain Injury  Discharge Home Follow up with your current outpatient provider and [facility]  Take all medications as prescribed  Disposition: No evidence of imminent risk to self or others at present.   Patient does not meet criteria for psychiatric inpatient admission. Supportive therapy provided about ongoing stressors. Discussed crisis plan, support from social network, calling 911, coming to the Emergency Department, and calling Suicide Hotline.  Ethelene Hal, NP 05/25/2017 11:11 AM   Patient seen face-to-face for psychiatric evaluation, chart reviewed and case discussed with the physician extender and  developed treatment plan. Reviewed the information documented and agree with the treatment plan.  Buford Dresser, DO 05/25/17 8:30 PM

## 2017-05-28 ENCOUNTER — Emergency Department (HOSPITAL_COMMUNITY)
Admission: EM | Admit: 2017-05-28 | Discharge: 2017-05-29 | Disposition: A | Discharge status: Home / Self Care | Payer: Medicaid Other | Attending: Emergency Medicine | Admitting: Emergency Medicine

## 2017-05-28 ENCOUNTER — Encounter (HOSPITAL_COMMUNITY): Payer: Self-pay | Admitting: Emergency Medicine

## 2017-05-28 ENCOUNTER — Other Ambulatory Visit: Payer: Self-pay

## 2017-05-28 ENCOUNTER — Emergency Department (HOSPITAL_COMMUNITY): Payer: Medicaid Other

## 2017-05-28 DIAGNOSIS — Z87891 Personal history of nicotine dependence: Secondary | ICD-10-CM | POA: Diagnosis not present

## 2017-05-28 DIAGNOSIS — R569 Unspecified convulsions: Secondary | ICD-10-CM | POA: Diagnosis present

## 2017-05-28 DIAGNOSIS — K5282 Eosinophilic colitis: Secondary | ICD-10-CM | POA: Insufficient documentation

## 2017-05-28 DIAGNOSIS — Z79899 Other long term (current) drug therapy: Secondary | ICD-10-CM | POA: Diagnosis not present

## 2017-05-28 DIAGNOSIS — K529 Noninfective gastroenteritis and colitis, unspecified: Secondary | ICD-10-CM

## 2017-05-28 LAB — URINALYSIS, ROUTINE W REFLEX MICROSCOPIC
Bilirubin Urine: NEGATIVE
GLUCOSE, UA: NEGATIVE mg/dL
Hgb urine dipstick: NEGATIVE
Ketones, ur: NEGATIVE mg/dL
Leukocytes, UA: NEGATIVE
Nitrite: NEGATIVE
PROTEIN: NEGATIVE mg/dL
Specific Gravity, Urine: 1.013 (ref 1.005–1.030)
pH: 6 (ref 5.0–8.0)

## 2017-05-28 LAB — CBC WITH DIFFERENTIAL/PLATELET
BASOS ABS: 0 10*3/uL (ref 0.0–0.1)
Basophils Relative: 0 %
Eosinophils Absolute: 0.3 10*3/uL (ref 0.0–0.7)
Eosinophils Relative: 4 %
HEMATOCRIT: 31 % — AB (ref 39.0–52.0)
HEMOGLOBIN: 10.4 g/dL — AB (ref 13.0–17.0)
LYMPHS PCT: 14 %
Lymphs Abs: 1.1 10*3/uL (ref 0.7–4.0)
MCH: 31.5 pg (ref 26.0–34.0)
MCHC: 33.5 g/dL (ref 30.0–36.0)
MCV: 93.9 fL (ref 78.0–100.0)
Monocytes Absolute: 0.6 10*3/uL (ref 0.1–1.0)
Monocytes Relative: 8 %
NEUTROS ABS: 5.7 10*3/uL (ref 1.7–7.7)
NEUTROS PCT: 74 %
PLATELETS: 126 10*3/uL — AB (ref 150–400)
RBC: 3.3 MIL/uL — ABNORMAL LOW (ref 4.22–5.81)
RDW: 12.6 % (ref 11.5–15.5)
WBC: 7.7 10*3/uL (ref 4.0–10.5)

## 2017-05-28 LAB — COMPREHENSIVE METABOLIC PANEL
ALT: 19 U/L (ref 17–63)
ANION GAP: 8 (ref 5–15)
AST: 23 U/L (ref 15–41)
Albumin: 3.5 g/dL (ref 3.5–5.0)
Alkaline Phosphatase: 130 U/L — ABNORMAL HIGH (ref 38–126)
BILIRUBIN TOTAL: 0.5 mg/dL (ref 0.3–1.2)
BUN: 19 mg/dL (ref 6–20)
CO2: 21 mmol/L — ABNORMAL LOW (ref 22–32)
Calcium: 9.2 mg/dL (ref 8.9–10.3)
Chloride: 112 mmol/L — ABNORMAL HIGH (ref 101–111)
Creatinine, Ser: 1.35 mg/dL — ABNORMAL HIGH (ref 0.61–1.24)
GFR calc Af Amer: >60 mL/min (ref >60)
Glucose, Bld: 114 mg/dL — ABNORMAL HIGH (ref 65–99)
POTASSIUM: 3.5 mmol/L (ref 3.5–5.1)
Sodium: 141 mmol/L (ref 135–145)
TOTAL PROTEIN: 7.3 g/dL (ref 6.5–8.1)

## 2017-05-28 LAB — RAPID URINE DRUG SCREEN, HOSP PERFORMED
Amphetamines: NOT DETECTED
BARBITURATES: POSITIVE — AB
Benzodiazepines: NOT DETECTED
Cocaine: NOT DETECTED
Opiates: NOT DETECTED
Tetrahydrocannabinol: NOT DETECTED

## 2017-05-28 LAB — LIPASE, BLOOD: Lipase: 22 U/L (ref 11–51)

## 2017-05-28 MED ORDER — IOPAMIDOL (ISOVUE-300) INJECTION 61%
100.0000 mL | Freq: Once | INTRAVENOUS | Status: AC | PRN
  Administered 2017-05-28: 100 mL via INTRAVENOUS

## 2017-05-28 MED ORDER — SODIUM CHLORIDE 0.9 % IV BOLUS (SEPSIS)
1000.0000 mL | Freq: Once | INTRAVENOUS | Status: AC
  Administered 2017-05-28: 1000 mL via INTRAVENOUS

## 2017-05-28 NOTE — ED Notes (Addendum)
Lab called and need a green tube on ice.

## 2017-05-28 NOTE — ED Notes (Signed)
Pt is aware of urine sample and has a urine sample at bedside.

## 2017-05-28 NOTE — ED Triage Notes (Signed)
Pt brought in by EMS from home with c/o seizures.  Pt was observed by family having grand mal seizures--- family reported that he has had 4 episodes lasting 1 minute each and with a few seconds interval.  Pt awake on EMS' arrival on scene--- no s/s injury noted by EMS; CBG = 115.  Pt is compliant with his seizure medication.  Pt dry heaving on arrival to ED.

## 2017-05-28 NOTE — ED Notes (Signed)
Pt can still not urinate for urine sample.

## 2017-05-28 NOTE — ED Provider Notes (Signed)
[facility]-EMERGENCY DEPT Provider Note   CSN: [id number] Arrival date & time: 05/28/17  1947     History   Chief Complaint Chief Complaint  Patient presents with  . Seizures    HPI Hector [name] is a 47 y.o. male past medical history of TBI, seizure activity, encephalopathy BIB EMS who presents for evaluation of seizures.  Per EMS, family reported 4 episodes of full tonic-clonic seizure that lasted 1 minute each.  EMS reported that there was a few seconds of interval in between each seizure.  Additional history provided by dad room.  He states that patient has been compliant with his medication.  He states that his last seizure was a week.  He was evaluated by his neurologist 1 week ago and had some changes to his medication which dad is unsure of.  Dad reports that patient has had some decreased appetite over the last few days.  He had one episode of nonbloody, nonbilious vomiting today.  Dad denies any fevers.  EM LEVEL 5 CAVEAT: Due to patient's baseline status.   The history is provided by a relative.    Past Medical History:  Diagnosis Date  . Cirrhosis (HCC)   . Depression   . DVT (deep venous thrombosis) (HCC)    UE, LE DVT during hospitalization  . GERD (gastroesophageal reflux disease)   . H/O ETOH abuse   . H/O renal calculi   . Seizure (HCC)   . TBI (traumatic brain injury) (HCC)    07/2013, fall from standing post seizure  . Tremor     Patient Active Problem List   Diagnosis Date Noted  . Agitation   . Aspiration pneumonia due to gastric secretions (HCC)   . Hypoxia   . Coarse tremors 04/18/2017  . ARF (acute renal failure) (HCC) 04/18/2017  . Hypokalemia   . Hypomagnesemia   . Physical deconditioning   . FTT (failure to thrive) in adult 02/25/2017  . Oropharyngeal dysphagia 01/01/2017  . Orthostatic hypotension 01/01/2017  . Failure to thrive in adult 12/31/2016  . Vomiting   . Seizure (HCC) 06/01/2015  . Alcoholic cirrhosis  of liver without ascites (HCC)   . Dehydration   . Encephalopathy acute 02/13/2015  . Altered mental status   . Ataxia 02/12/2015  . Localization-related symptomatic epilepsy and epileptic syndromes with complex partial seizures, not intractable, without status epilepticus (HCC) 08/16/2014  . Tobacco abuse 08/16/2014  . Acute kidney injury superimposed on CKD (HCC) 06/25/2014  . Dysphagia 06/25/2014  . Centrilobular emphysema (HCC) 01/27/2014  . Tracheostomy in place [facility](HCC) 01/27/2014  . Encounter for family conference with patient present 01/23/2014  . Dvt femoral (deep venous thrombosis) (HCC) 01/05/2014  . Protein-calorie malnutrition, severe (HCC) 01/05/2014  . Alcohol abuse 01/05/2014  . Acute respiratory failure with hypoxia (HCC) 12/20/2013  . Delirium due to general medical condition 12/13/2013  . HCAP (healthcare-associated pneumonia) 12/13/2013  . SIRS (systemic inflammatory response syndrome) (HCC) 12/13/2013  . TBI (traumatic brain injury) (HCC) 12/12/2013    Past Surgical History:  Procedure Laterality Date  . BRAIN SURGERY    . GASTROSTOMY TUBE PLACEMENT    . RADIOLOGY WITH ANESTHESIA N/A 04/20/2017   Procedure: MRI WITH ANESTHESIA OF THE BRAIN;  Surgeon: Radiologist, Medication, MD;  Location: [facility] OR;  Service: Radiology;  Laterality: N/A;  . REMOVAL OF GASTROSTOMY TUBE    . TRACHEOSTOMY     feinstein       Home Medications    Prior to Admission  medications   Medication Sig Start Date End Date Taking? Authorizing Provider  amantadine (SYMMETREL) 100 MG capsule TAKE ONE CAPSULE BY MOUTH TWICE A DAY 01/06/17  Yes [provider]  bromocriptine (PARLODEL) 2.5 MG tablet Take 5 mg by mouth 2 (two) times daily.   Yes [provider]  clindamycin (CLEOCIN) 150 MG capsule Take 150 mg by mouth daily. 05/10/17  Yes [provider]  clonazePAM (KLONOPIN) 1 MG tablet Take 1 mg by mouth at bedtime. 12/24/16  Yes [provider]    escitalopram (LEXAPRO) 5 MG tablet Take 1 tablet (5 mg total) by mouth daily. 04/30/17  Yes Maxie Barb, MD  feeding supplement, ENSURE ENLIVE, (ENSURE ENLIVE) LIQD Take 237 mLs 3 (three) times daily between meals by mouth. 02/28/17  Yes Vassie Loll, MD  Lacosamide 100 MG TABS Take 1 tablet (100 mg total) by mouth 2 (two) times daily. 05/19/17  Yes Jaffe, Adam R, DO  ondansetron (ZOFRAN ODT) 8 MG disintegrating tablet Take 1 tablet (8 mg total) every 8 (eight) hours as needed by mouth for nausea or vomiting. 02/28/17  Yes Vassie Loll, MD  oxcarbazepine (TRILEPTAL) 600 MG tablet TAKE 1 TABLET BY MOUTH TWICE A DAY 05/03/17  Yes Jaffe, Adam R, DO  pantoprazole (PROTONIX) 40 MG tablet Take 40 mg by mouth daily.    Yes [provider]  polyethylene glycol (MIRALAX / GLYCOLAX) packet Take 17 g by mouth daily as needed for moderate constipation.   Yes [provider]  QUEtiapine (SEROQUEL) 25 MG tablet Take 1 tablet (25 mg total) by mouth 2 (two) times daily. 05/25/17  Yes Laveda Abbe, NP  topiramate (TOPAMAX) 25 MG tablet Take 2 tablets twice daily for 7 days, then 1 tablet twice daily for 7 days, then STOP Patient taking differently: Take 25 mg by mouth 2 (two) times daily. Take 2 tablets twice daily for 7 days, then 1 tablet twice daily for 7 days, then STOP 05/19/17  Yes Drema Dallas, DO    Family History Family History  Problem Relation Age of Onset  . Hypercholesterolemia Mother   . Hypertension Mother   . Congestive Heart Failure Brother        Deceased age 19    Social History Social History   Tobacco Use  . Smoking status: Former Smoker    Packs/day: 0.25    Years: 20.00    Pack years: 5.00    Types: Cigarettes    Last attempt to quit: 04/17/2017    Years since quitting: 0.1  . Smokeless tobacco: Never Used  Substance Use Topics  . Alcohol use: Yes    Alcohol/week: 0.0 oz  . Drug use: No     Allergies   Ativan [lorazepam] and  Lactulose   Review of Systems Review of Systems  Unable to perform ROS: Patient nonverbal     Physical Exam Updated Vital Signs BP 112/78 (BP Location: Right Arm)   Pulse 79   Temp (!) 97.4 F (36.3 C) (Oral)   Resp (!) 24   SpO2 99%   Physical Exam  Constitutional: He appears well-developed and well-nourished.  HENT:  Head: Normocephalic and atraumatic.  Mouth/Throat: Oropharynx is clear and moist and mucous membranes are normal.  Eyes: Conjunctivae, EOM and lids are normal. Pupils are equal, round, and reactive to light.  Neck: Full passive range of motion without pain.  Cardiovascular: Normal rate, regular rhythm, normal heart sounds and normal pulses. Exam reveals no gallop and no  friction rub.  No murmur heard. Pulses:      Radial pulses are 2+ on the right side, and 2+ on the left side.       Dorsalis pedis pulses are 2+ on the right side, and 2+ on the left side.  Pulmonary/Chest: Effort normal and breath sounds normal.  Abdominal: Soft. Normal appearance. There is tenderness in the left lower quadrant. There is no rigidity and no guarding.  Abdomen is soft, non-distended. No rigidity. Patient does exhibit some tenderness to the LLQ.   Musculoskeletal: Normal range of motion.  Neurological: He is alert. He displays atrophy.  Tremor noted of bilateral lower extremities from knee.  No full tonic-clonic seizure activity.  Moving all extremities.  Equal grip strength. Non-verbal which is patient's baseline per dad.   Skin: Skin is warm and dry. Capillary refill takes less than 2 seconds.  Psychiatric: He has a normal mood and affect. His speech is normal.  Nursing note and vitals reviewed.    ED Treatments / Results  Labs (all labs ordered are listed, but only abnormal results are displayed) Labs Reviewed  COMPREHENSIVE METABOLIC PANEL - Abnormal; Notable for the following components:      Result Value   Chloride 112 (*)    CO2 21 (*)    Glucose, Bld 114 (*)     Creatinine, Ser 1.35 (*)    Alkaline Phosphatase 130 (*)    All other components within normal limits  CBC WITH DIFFERENTIAL/PLATELET - Abnormal; Notable for the following components:   RBC 3.30 (*)    Hemoglobin 10.4 (*)    HCT 31.0 (*)    Platelets 126 (*)    All other components within normal limits  RAPID URINE DRUG SCREEN, HOSP PERFORMED - Abnormal; Notable for the following components:   Barbiturates POSITIVE (*)    All other components within normal limits  URINALYSIS, ROUTINE W REFLEX MICROSCOPIC  LIPASE, BLOOD  TOPIRAMATE LEVEL  CARBAMAZEPINE LEVEL, TOTAL  INFLUENZA PANEL BY PCR (TYPE A & B)  AMMONIA    EKG  EKG Interpretation None       Radiology Dg Chest 2 View  Result Date: 05/28/2017 CLINICAL DATA:  Seizures today.  Former smoker.  Cough. EXAM: CHEST  2 VIEW COMPARISON:  05/24/2017 FINDINGS: Peribronchial thickening and central interstitial changes suggesting chronic bronchitis. No airspace disease or consolidation in the lungs. No blunting of costophrenic angles. No pneumothorax. Mediastinal contours are intact. Heart size and pulmonary vascularity are normal. No change since previous study. IMPRESSION: Chronic bronchitic changes in the lungs. No evidence of active pulmonary disease. Electronically Signed   By: Burman Nieves M.D.   On: 05/28/2017 23:13    Procedures Procedures (including critical care time)  Medications Ordered in ED Medications  sodium chloride 0.9 % bolus 1,000 mL (0 mLs Intravenous Stopped 05/28/17 2145)  sodium chloride 0.9 % bolus 1,000 mL (1,000 mLs Intravenous New Bag/Given 05/28/17 2253)  iopamidol (ISOVUE-300) 61 % injection 100 mL (100 mLs Intravenous Contrast Given 05/28/17 2318)     Initial Impression / Assessment and Plan / ED Course  I have reviewed the triage vital signs and the nursing notes.  Pertinent labs & imaging results that were available during my care of the patient were reviewed by me and considered in my medical  decision making (see chart for details).     47 y.o. past medical history of TBI, encephalopathy who presents for evaluation of seizures that occurred this evening prior to ED arrival.  Dad reports that patient has a history of seizures and is on antiepileptic medication.  He reports that today, patient had 4 seizures each lasting approximate 1 minute with full tonic-clonic movement.  Dad reports that there was approximate 30 seconds to a minute in between each seizure.  After the fourth 1, patient did not have patient did exhibit some postictal behavior immediately after seizure.  Dad reports that his last seizure was 1 week ago.  He was seen by his neurologist approximate 1 week ago and dad reports that he did have a change in his medication.  He states that one medication was decreased but he is unsure which one.  Concerned that patient may have subtherapeutic levels of his antiepileptic medication that may be causing seizures.  Patient does not exhibit any seizure activity here in the department.  He does have some tremor but dad says this is baseline.  Dad does report the patient has been having some nausea/vomiting over the last few days.  Patient does have some left lower quadrant abdominal tenderness.  Plan to check basic labs, including antiepileptic medication levels. IVF given for fluid resuscitation.   Labs and imaging reviewed.  UA is negative for any acute infectious etiology.  UDS shows positive for barbiturates which patient endorses taking for his seizure medication.  Lipase is unremarkable.  CBC shows anemia which is consistent with baseline.  No leukocytosis.  CMP shows slight bump in creatinine at 1.35.  Chest x-ray shows chronic bronchitic changes but no acute infectious etiology.  Patient signed out to Arthor Captain, PA-C with labs and CT abdomen pelvis pending.  If patient has any more seizure activity, plan for admission.  Please see her note for further ED course.  Final Clinical  Impressions(s) / ED Diagnoses   Final diagnoses:  Seizure [facility])  Proctocolitis    ED Discharge Orders    None       Rosana Hoes 05/29/17 1509    Mancel Bale, MD 05/29/17 2306

## 2017-05-28 NOTE — ED Notes (Signed)
Bed: WA09 Expected date:  Expected time:  Means of arrival:  Comments: Ems seizure 6431m

## 2017-05-29 LAB — AMMONIA: Ammonia: 14 umol/L (ref 9–35)

## 2017-05-29 LAB — CARBAMAZEPINE LEVEL, TOTAL: Carbamazepine Lvl: <2 ug/mL — ABNORMAL LOW (ref 4.0–12.0)

## 2017-05-29 LAB — INFLUENZA PANEL BY PCR (TYPE A & B)
INFLBPCR: NEGATIVE
Influenza A By PCR: NEGATIVE

## 2017-05-29 MED ORDER — ONDANSETRON 4 MG PO TBDP: 4.0000 mg | ORAL_TABLET | Freq: Once | ORAL | Status: DC

## 2017-05-29 MED ORDER — METRONIDAZOLE 500 MG PO TABS: 500.0000 mg | ORAL_TABLET | Freq: Two times a day (BID) | ORAL | 0 refills | Status: DC

## 2017-05-29 MED ORDER — CIPROFLOXACIN HCL 500 MG PO TABS
500.0000 mg | ORAL_TABLET | Freq: Once | ORAL | Status: AC
  Administered 2017-05-29: 500 mg via ORAL
  Filled 2017-05-29: qty 1

## 2017-05-29 MED ORDER — METRONIDAZOLE 500 MG PO TABS
500.0000 mg | ORAL_TABLET | Freq: Once | ORAL | Status: AC
  Administered 2017-05-29: 500 mg via ORAL
  Filled 2017-05-29: qty 1

## 2017-05-29 MED ORDER — CIPROFLOXACIN HCL 500 MG PO TABS: 500.0000 mg | ORAL_TABLET | Freq: Two times a day (BID) | ORAL | 0 refills | Status: DC

## 2017-05-29 NOTE — ED Notes (Signed)
Made contact with Father. Father will be in route to ED "in just a few minutes". Father is Theone StanleyLester Forbush and contact number is [phone]. RN will go over discharge with father and pt and RN will have father sign for pt at discharge.

## 2017-05-29 NOTE — Discharge Instructions (Signed)
Contact a health care provider if: °Your symptoms do not go away. °You develop new symptoms. °Get help right away if: °You have a fever that does not go away with treatment. °You develop chills. °You have extreme weakness, fainting, or dehydration. °You have repeated vomiting. °You develop severe pain in your abdomen. °You pass bloody or tarry stool. °

## 2017-05-29 NOTE — ED Provider Notes (Signed)
Patient given to me and sign out by PA layden at shift change. The patient CT scan shows acute proctocolitis which may be the cause of his vomiting.  He has not had any seizures here in the emergency department.  He is hemodynamically stable.  Patient started treatment on Cipro and Flagyl here.  I discussed the case with his father who will pick the patient up to take him home.  He should follow closely with neurology.  I consulted with our pharmacist regarding drug interactions and he appears safe to take Cipro and Flagyl for treatment.  Patient appears appropriate for discharge at this time.   Arthor CaptainHarris, Chauntay Paszkiewicz, PA-C 05/29/17 0753    Paula LibraMolpus, John, MD 05/29/17 [phone]

## 2017-05-30 LAB — TOPIRAMATE LEVEL: Topiramate Lvl: 11.8 ug/mL (ref 2.0–25.0)

## 2017-06-03 ENCOUNTER — Emergency Department (HOSPITAL_COMMUNITY): Payer: Medicaid Other

## 2017-06-03 ENCOUNTER — Inpatient Hospital Stay (HOSPITAL_COMMUNITY)
Admission: EM | Admit: 2017-06-03 | Discharge: 2017-06-06 | DRG: 193 | Disposition: A | Discharge status: Home / Self Care | Payer: Medicaid Other | Attending: Family Medicine | Admitting: Family Medicine

## 2017-06-03 ENCOUNTER — Encounter (HOSPITAL_COMMUNITY): Payer: Self-pay

## 2017-06-03 DIAGNOSIS — E872 Acidosis: Secondary | ICD-10-CM | POA: Diagnosis present

## 2017-06-03 DIAGNOSIS — Z86718 Personal history of other venous thrombosis and embolism: Secondary | ICD-10-CM

## 2017-06-03 DIAGNOSIS — R1310 Dysphagia, unspecified: Secondary | ICD-10-CM | POA: Diagnosis present

## 2017-06-03 DIAGNOSIS — G252 Other specified forms of tremor: Secondary | ICD-10-CM | POA: Diagnosis present

## 2017-06-03 DIAGNOSIS — G40209 Localization-related (focal) (partial) symptomatic epilepsy and epileptic syndromes with complex partial seizures, not intractable, without status epilepticus: Secondary | ICD-10-CM | POA: Diagnosis present

## 2017-06-03 DIAGNOSIS — R569 Unspecified convulsions: Secondary | ICD-10-CM

## 2017-06-03 DIAGNOSIS — K219 Gastro-esophageal reflux disease without esophagitis: Secondary | ICD-10-CM | POA: Diagnosis present

## 2017-06-03 DIAGNOSIS — R791 Abnormal coagulation profile: Secondary | ICD-10-CM | POA: Diagnosis present

## 2017-06-03 DIAGNOSIS — Y95 Nosocomial condition: Secondary | ICD-10-CM | POA: Diagnosis present

## 2017-06-03 DIAGNOSIS — Z8659 Personal history of other mental and behavioral disorders: Secondary | ICD-10-CM

## 2017-06-03 DIAGNOSIS — F0390 Unspecified dementia without behavioral disturbance: Secondary | ICD-10-CM | POA: Diagnosis present

## 2017-06-03 DIAGNOSIS — Z681 Body mass index (BMI) 19 or less, adult: Secondary | ICD-10-CM

## 2017-06-03 DIAGNOSIS — N179 Acute kidney failure, unspecified: Secondary | ICD-10-CM | POA: Diagnosis present

## 2017-06-03 DIAGNOSIS — K5900 Constipation, unspecified: Secondary | ICD-10-CM | POA: Diagnosis present

## 2017-06-03 DIAGNOSIS — R4701 Aphasia: Secondary | ICD-10-CM | POA: Diagnosis present

## 2017-06-03 DIAGNOSIS — Z87820 Personal history of traumatic brain injury: Secondary | ICD-10-CM

## 2017-06-03 DIAGNOSIS — X58XXXA Exposure to other specified factors, initial encounter: Secondary | ICD-10-CM | POA: Diagnosis present

## 2017-06-03 DIAGNOSIS — R4702 Dysphasia: Secondary | ICD-10-CM | POA: Diagnosis present

## 2017-06-03 DIAGNOSIS — N189 Chronic kidney disease, unspecified: Secondary | ICD-10-CM | POA: Diagnosis present

## 2017-06-03 DIAGNOSIS — T17810A Gastric contents in other parts of respiratory tract causing asphyxiation, initial encounter: Secondary | ICD-10-CM | POA: Diagnosis present

## 2017-06-03 DIAGNOSIS — Z8679 Personal history of other diseases of the circulatory system: Secondary | ICD-10-CM

## 2017-06-03 DIAGNOSIS — Z888 Allergy status to other drugs, medicaments and biological substances status: Secondary | ICD-10-CM

## 2017-06-03 DIAGNOSIS — G8191 Hemiplegia, unspecified affecting right dominant side: Secondary | ICD-10-CM | POA: Diagnosis present

## 2017-06-03 DIAGNOSIS — K7030 Alcoholic cirrhosis of liver without ascites: Secondary | ICD-10-CM | POA: Diagnosis present

## 2017-06-03 DIAGNOSIS — E43 Unspecified severe protein-calorie malnutrition: Secondary | ICD-10-CM | POA: Diagnosis present

## 2017-06-03 DIAGNOSIS — Z87891 Personal history of nicotine dependence: Secondary | ICD-10-CM

## 2017-06-03 DIAGNOSIS — J189 Pneumonia, unspecified organism: Secondary | ICD-10-CM | POA: Diagnosis present

## 2017-06-03 DIAGNOSIS — Z792 Long term (current) use of antibiotics: Secondary | ICD-10-CM

## 2017-06-03 DIAGNOSIS — Z79899 Other long term (current) drug therapy: Secondary | ICD-10-CM

## 2017-06-03 DIAGNOSIS — J432 Centrilobular emphysema: Secondary | ICD-10-CM | POA: Diagnosis present

## 2017-06-03 DIAGNOSIS — J180 Bronchopneumonia, unspecified organism: Principal | ICD-10-CM | POA: Diagnosis present

## 2017-06-03 DIAGNOSIS — J690 Pneumonitis due to inhalation of food and vomit: Secondary | ICD-10-CM | POA: Diagnosis present

## 2017-06-03 DIAGNOSIS — Z930 Tracheostomy status: Secondary | ICD-10-CM

## 2017-06-03 DIAGNOSIS — R402413 Glasgow coma scale score 13-15, at hospital admission: Secondary | ICD-10-CM | POA: Diagnosis present

## 2017-06-03 LAB — I-STAT ARTERIAL BLOOD GAS, ED
ACID-BASE EXCESS: 3 mmol/L — AB (ref 0.0–2.0)
BICARBONATE: 24.1 mmol/L (ref 20.0–28.0)
O2 SAT: 99 %
PCO2 ART: 25.2 mmHg — AB (ref 32.0–48.0)
PH ART: 7.588 — AB (ref 7.350–7.450)
PO2 ART: 98 mmHg (ref 83.0–108.0)
Patient temperature: 98.8
TCO2: 25 mmol/L (ref 22–32)

## 2017-06-03 LAB — COMPREHENSIVE METABOLIC PANEL
ALBUMIN: 3.7 g/dL (ref 3.5–5.0)
ALT: 28 U/L (ref 17–63)
AST: 36 U/L (ref 15–41)
Alkaline Phosphatase: 142 U/L — ABNORMAL HIGH (ref 38–126)
Anion gap: 15 (ref 5–15)
BUN: 11 mg/dL (ref 6–20)
CHLORIDE: 101 mmol/L (ref 101–111)
CO2: 25 mmol/L (ref 22–32)
CREATININE: 1.29 mg/dL — AB (ref 0.61–1.24)
Calcium: 9.9 mg/dL (ref 8.9–10.3)
GFR calc Af Amer: >60 mL/min (ref >60)
GFR calc non Af Amer: >60 mL/min (ref >60)
GLUCOSE: 113 mg/dL — AB (ref 65–99)
Potassium: 3.9 mmol/L (ref 3.5–5.1)
SODIUM: 141 mmol/L (ref 135–145)
Total Bilirubin: 0.4 mg/dL (ref 0.3–1.2)
Total Protein: 7.7 g/dL (ref 6.5–8.1)

## 2017-06-03 LAB — INFLUENZA PANEL BY PCR (TYPE A & B)
INFLAPCR: NEGATIVE
INFLBPCR: NEGATIVE

## 2017-06-03 LAB — I-STAT CG4 LACTIC ACID, ED
LACTIC ACID, VENOUS: 1.81 mmol/L (ref 0.5–1.9)
Lactic Acid, Venous: 2.71 mmol/L (ref 0.5–1.9)

## 2017-06-03 LAB — URINALYSIS, ROUTINE W REFLEX MICROSCOPIC
BILIRUBIN URINE: NEGATIVE
Glucose, UA: NEGATIVE mg/dL
Hgb urine dipstick: NEGATIVE
Ketones, ur: NEGATIVE mg/dL
LEUKOCYTES UA: NEGATIVE
NITRITE: NEGATIVE
PH: 6 (ref 5.0–8.0)
Protein, ur: NEGATIVE mg/dL
SPECIFIC GRAVITY, URINE: 1.045 — AB (ref 1.005–1.030)

## 2017-06-03 LAB — CBC WITH DIFFERENTIAL/PLATELET
Basophils Absolute: 0 10*3/uL (ref 0.0–0.1)
Basophils Relative: 0 %
EOS ABS: 0.1 10*3/uL (ref 0.0–0.7)
EOS PCT: 1 %
HCT: 38.7 % — ABNORMAL LOW (ref 39.0–52.0)
Hemoglobin: 12.7 g/dL — ABNORMAL LOW (ref 13.0–17.0)
LYMPHS ABS: 1.3 10*3/uL (ref 0.7–4.0)
LYMPHS PCT: 14 %
MCH: 31 pg (ref 26.0–34.0)
MCHC: 32.8 g/dL (ref 30.0–36.0)
MCV: 94.4 fL (ref 78.0–100.0)
MONOS PCT: 7 %
Monocytes Absolute: 0.6 10*3/uL (ref 0.1–1.0)
Neutro Abs: 7 10*3/uL (ref 1.7–7.7)
Neutrophils Relative %: 78 %
PLATELETS: 234 10*3/uL (ref 150–400)
RBC: 4.1 MIL/uL — ABNORMAL LOW (ref 4.22–5.81)
RDW: 12.5 % (ref 11.5–15.5)
WBC: 9 10*3/uL (ref 4.0–10.5)

## 2017-06-03 LAB — D-DIMER, QUANTITATIVE: D-Dimer, Quant: 0.86 ug/mL-FEU — ABNORMAL HIGH (ref 0.00–0.50)

## 2017-06-03 LAB — I-STAT TROPONIN, ED: TROPONIN I, POC: 0.01 ng/mL (ref 0.00–0.08)

## 2017-06-03 MED ORDER — VANCOMYCIN HCL IN DEXTROSE 750-5 MG/150ML-% IV SOLN
750.0000 mg | INTRAVENOUS | Status: DC
  Administered 2017-06-04: 750 mg via INTRAVENOUS
  Filled 2017-06-03: qty 150

## 2017-06-03 MED ORDER — BROMOCRIPTINE MESYLATE 2.5 MG PO TABS
5.0000 mg | ORAL_TABLET | Freq: Two times a day (BID) | ORAL | Status: DC
  Administered 2017-06-04 – 2017-06-06 (×5): 5 mg via ORAL
  Filled 2017-06-03 (×7): qty 2

## 2017-06-03 MED ORDER — QUETIAPINE FUMARATE 25 MG PO TABS
25.0000 mg | ORAL_TABLET | Freq: Two times a day (BID) | ORAL | Status: DC
  Administered 2017-06-03 – 2017-06-06 (×6): 25 mg via ORAL
  Filled 2017-06-03 (×6): qty 1

## 2017-06-03 MED ORDER — POLYETHYLENE GLYCOL 3350 17 G PO PACK
17.0000 g | PACK | Freq: Every day | ORAL | Status: DC | PRN
  Filled 2017-06-03: qty 1

## 2017-06-03 MED ORDER — ONDANSETRON HCL 4 MG/2ML IJ SOLN: 4.0000 mg | Freq: Four times a day (QID) | INTRAMUSCULAR | Status: DC | PRN

## 2017-06-03 MED ORDER — ONDANSETRON HCL 4 MG PO TABS: 4.0000 mg | ORAL_TABLET | Freq: Four times a day (QID) | ORAL | Status: DC | PRN

## 2017-06-03 MED ORDER — CLONAZEPAM 1 MG PO TABS
1.0000 mg | ORAL_TABLET | Freq: Every day | ORAL | Status: DC
  Administered 2017-06-03 – 2017-06-05 (×3): 1 mg via ORAL
  Filled 2017-06-03 (×3): qty 1

## 2017-06-03 MED ORDER — ENSURE ENLIVE PO LIQD
237.0000 mL | Freq: Three times a day (TID) | ORAL | Status: DC
  Administered 2017-06-04 – 2017-06-06 (×7): 237 mL via ORAL

## 2017-06-03 MED ORDER — SODIUM CHLORIDE 0.9 % IV BOLUS (SEPSIS)
1000.0000 mL | Freq: Once | INTRAVENOUS | Status: AC
Start: 2017-06-03 — End: 2017-06-03
  Administered 2017-06-03: 1000 mL via INTRAVENOUS

## 2017-06-03 MED ORDER — MIDAZOLAM HCL 2 MG/2ML IJ SOLN
2.0000 mg | Freq: Once | INTRAMUSCULAR | Status: AC
  Administered 2017-06-03: 2 mg via INTRAVENOUS
  Filled 2017-06-03: qty 2

## 2017-06-03 MED ORDER — SODIUM CHLORIDE 0.9% FLUSH: 3.0000 mL | INTRAVENOUS | Status: DC | PRN

## 2017-06-03 MED ORDER — SODIUM CHLORIDE 0.9 % IV SOLN: 250.0000 mL | INTRAVENOUS | Status: DC | PRN

## 2017-06-03 MED ORDER — ACETAMINOPHEN 325 MG PO TABS
650.0000 mg | ORAL_TABLET | Freq: Four times a day (QID) | ORAL | Status: DC | PRN
  Filled 2017-06-03: qty 2

## 2017-06-03 MED ORDER — ACETAMINOPHEN 650 MG RE SUPP: 650.0000 mg | Freq: Four times a day (QID) | RECTAL | Status: DC | PRN

## 2017-06-03 MED ORDER — OXCARBAZEPINE 300 MG PO TABS
600.0000 mg | ORAL_TABLET | Freq: Two times a day (BID) | ORAL | Status: DC
  Administered 2017-06-03 – 2017-06-06 (×6): 600 mg via ORAL
  Filled 2017-06-03 (×6): qty 2

## 2017-06-03 MED ORDER — PANTOPRAZOLE SODIUM 40 MG PO TBEC
40.0000 mg | DELAYED_RELEASE_TABLET | Freq: Every day | ORAL | Status: DC
  Administered 2017-06-04 – 2017-06-06 (×3): 40 mg via ORAL
  Filled 2017-06-03 (×3): qty 1

## 2017-06-03 MED ORDER — ENOXAPARIN SODIUM 40 MG/0.4ML ~~LOC~~ SOLN
40.0000 mg | SUBCUTANEOUS | Status: DC
  Administered 2017-06-03 – 2017-06-05 (×3): 40 mg via SUBCUTANEOUS
  Filled 2017-06-03 (×3): qty 0.4

## 2017-06-03 MED ORDER — ESCITALOPRAM OXALATE 10 MG PO TABS
5.0000 mg | ORAL_TABLET | Freq: Every day | ORAL | Status: DC
  Administered 2017-06-04 – 2017-06-06 (×3): 5 mg via ORAL
  Filled 2017-06-03 (×3): qty 1

## 2017-06-03 MED ORDER — LACOSAMIDE 50 MG PO TABS
100.0000 mg | ORAL_TABLET | Freq: Two times a day (BID) | ORAL | Status: DC
  Administered 2017-06-03 – 2017-06-04 (×2): 100 mg via ORAL
  Filled 2017-06-03 (×2): qty 2

## 2017-06-03 MED ORDER — AMANTADINE HCL 100 MG PO CAPS
100.0000 mg | ORAL_CAPSULE | Freq: Two times a day (BID) | ORAL | Status: DC
  Administered 2017-06-03 – 2017-06-06 (×6): 100 mg via ORAL
  Filled 2017-06-03 (×6): qty 1

## 2017-06-03 MED ORDER — SODIUM CHLORIDE 0.9% FLUSH
3.0000 mL | Freq: Two times a day (BID) | INTRAVENOUS | Status: DC
  Administered 2017-06-03 – 2017-06-06 (×5): 3 mL via INTRAVENOUS

## 2017-06-03 MED ORDER — PIPERACILLIN-TAZOBACTAM 3.375 G IVPB
3.3750 g | Freq: Three times a day (TID) | INTRAVENOUS | Status: DC
  Administered 2017-06-04 – 2017-06-05 (×4): 3.375 g via INTRAVENOUS
  Filled 2017-06-03 (×5): qty 50

## 2017-06-03 MED ORDER — DEXTROSE 5 % IV SOLN
1.0000 g | Freq: Once | INTRAVENOUS | Status: AC
  Administered 2017-06-03: 1 g via INTRAVENOUS
  Filled 2017-06-03: qty 1

## 2017-06-03 MED ORDER — IOPAMIDOL (ISOVUE-370) INJECTION 76%
INTRAVENOUS | Status: AC
  Administered 2017-06-03: 100 mL
  Filled 2017-06-03: qty 100

## 2017-06-03 MED ORDER — DEXTROSE 5 % IV SOLN: 1.0000 g | INTRAVENOUS | Status: DC

## 2017-06-03 MED ORDER — VANCOMYCIN HCL IN DEXTROSE 1-5 GM/200ML-% IV SOLN
1000.0000 mg | Freq: Once | INTRAVENOUS | Status: AC
  Administered 2017-06-03: 1000 mg via INTRAVENOUS
  Filled 2017-06-03: qty 200

## 2017-06-03 NOTE — H&P (Signed)
Family Medicine Teaching [facility] Admission History and Physical Service Pager: [phone]  Patient name: Hector [name] Medical record number: [id number] Date of birth: [birth year] Age: 47 y.o. Gender: male  Primary Care Provider: Medicine, [facility]ants: None Code Status: Full   Chief Complaint: cough, dyspnea, increased seizures  Assessment and Plan: Hector [name] is a 47 y.o. male presenting with cough, dyspnea, increased seizure activity. PMH is significant for traumatic brain injury, seizure disorder, aphasia, right-sided weakness, alcoholic cirrhosis, dysphasia  Pneumonia; Patient has had increased coughing and dyspnea per father's report.  History is mostly per epic review and speaking to father as patient has aphasia.  Reassuringly he is afebrile with normal blood pressure and heart rate, he does have slight tachypnea into the low 20s on examination but was up to 50 in the ED.  O2 and high 90s on room air. WBC of 9.  Electrolytes are normal with a slight rise in creatinine of 1.29 and increased ALP 242.  Lactic acid initially 2.71 but has come down to 1.81 on recheck. ABG showing signs of possible respiratory alkalosis with pH of 7.58 and pCO2 of 25.2. Chest x-ray unremarkable however Influenza negative. CTA was performed to rule out PE due to elevated d-dimer of 0.86; CTA showing no PE but bronchopneumonia most pronounced in left lower lobe.  Respiratory wheezing without any crackles or wheezing to auscultation bilaterally.  Will need to treat for HAP given ED stay from 1/29-1/30 and 2/2-2/3.  Also possible aspiration pneumonia given history of dysphasia and increased seizure activity. -Admit to family medicine teaching service, telemetry, attending Dr. Pollie Meyer -Vital signs per floor protocol -Vancomycin and Zosyn per pharmacy -Supplemental O2 as needed -Blood cultures pending -sputum culture if able to produce sputum -Daily BMP and  CBC  Traumatic brain injury/seizure disorder/aphasia/dementia; per epic review had a traumatic brain injury with subdural hematoma after falling after a seizure in 2015 (alcohol was involved).  Subsequently he developed a aphasia and right-sided weakness. He follows with neurology.  Seen by Dr. Everlena Cooper on January 24, at that time was recommended that he taper off of Topamax.  He was also suggested that he start primidone however this is not on his medication list.  No active seizures seen by medical team in ED.  According to father his seizures are full body convulsions and eyes roll into the back of the head. -Continue home medications; amantadine 100 mg twice daily, bromocriptine 5 mg twice daily, Klonopin 1 mg nightly, Lexapro 5 mg daily, lacosamide 100 mg twice daily, Trileptal 600 mg twice daily, Seroquel 25 mg twice daily -Seizure precautions -We will touch base with neurology tomorrow morning  -Apparently allergic to Ativan as it causes a paradoxical reaction.  If he does start seizing; may consider another short acting benzo and touching base with neurology -PT/OT  History of dysphasia: Apparently was on dysphasia 3 diet in the past. ?  Aspiration pneumonia -Resume dysphagia 3 diet -SLP consult  History of alcoholic cirrhosis: Previous alcoholic.  Has not used alcohol in a long time according to father.  ALP is elevated to 142, AST and ALT within normal limits.  Constipation: CT abdomen showing large amounts of stool throughout colon last BM yesterday per father.  Patient says yes to abdominal pain on exam, seems generalized.  Does not grimace when palpating abdomen. -continue MiraLAX  Mild AKI: Creatinine 1.29.  Possibly prerenal in etiology vs CKD (CKD on past medical history list) -Can orally hydrate himself -A.m.  BMP   History of protein calorie malnutrition: Normal albumin of 3.7 on admission.  He does appear thin on exam and drinks mostly Ensure as per father's report -Continue  Ensure  GERD -Continue home PPI; Protonix 40 mg daily  FEN/GI: Dysphasia 3 diet,  saline lock IV Prophylaxis: Lovenox  Disposition: Admit to FPTS, telemetry, attending Dr. Pollie MeyerMcIntyre  History of Present Illness:  Hector [name] is a 47 y.o. male presenting with cough, dyspnea, and increased seizure activity.  History limited secondary to patient's aphasia.  He is able to say yes and no, shake his head yes or no, say I know or I do not know.  Most of history is per ED physician sign out and note.  He has had increased cough and dyspnea over the last couple days.  His father also noted that he has had multiple seizures over the last 5 days.  He has not had any nausea, vomiting, diarrhea.  He has been compliant with all of his medications and has not missed any of his doses.  He was recently seen in the ED on February 3 and was discharged with diagnosis of proctitis and given prescription for Cipro and Flagyl but father did not give as he was worried about the side effects of the medication.  Called father [name] for more information. Father states that he's had about 4 seizures in the last day. A typical seizure for him will be his whole body will shake and eyes will roll back into head. He has not had any fevers. He has not been eating very much. Drinking lots of ensures and water at home. Patient does live at home with his father. He is able to ambulate at home sporadically. Father states sometimes he won't get out of bed. He has "shakes" at baseline. His last BM was yesterday. He is able to urinate and defecate independently on the toilet. No current alcohol use.    Review Of Systems: Per HPI with the following additions: see HPI  ROS  Patient Active Problem List   Diagnosis Date Noted  . Agitation   . Aspiration pneumonia due to gastric secretions (HCC)   . Hypoxia   . Coarse tremors 04/18/2017  . ARF (acute renal failure) (HCC) 04/18/2017  . Hypokalemia   . Hypomagnesemia   .  Physical deconditioning   . FTT (failure to thrive) in adult 02/25/2017  . Oropharyngeal dysphagia 01/01/2017  . Orthostatic hypotension 01/01/2017  . Failure to thrive in adult 12/31/2016  . Vomiting   . Seizure (HCC) 06/01/2015  . Alcoholic cirrhosis of liver without ascites (HCC)   . Dehydration   . Encephalopathy acute 02/13/2015  . Altered mental status   . Ataxia 02/12/2015  . Localization-related symptomatic epilepsy and epileptic syndromes with complex partial seizures, not intractable, without status epilepticus (HCC) 08/16/2014  . Tobacco abuse 08/16/2014  . Acute kidney injury superimposed on CKD (HCC) 06/25/2014  . Dysphagia 06/25/2014  . Centrilobular emphysema (HCC) 01/27/2014  . Tracheostomy in place [facility](HCC) 01/27/2014  . Encounter for family conference with patient present 01/23/2014  . Dvt femoral (deep venous thrombosis) (HCC) 01/05/2014  . Protein-calorie malnutrition, severe (HCC) 01/05/2014  . Alcohol abuse 01/05/2014  . Acute respiratory failure with hypoxia (HCC) 12/20/2013  . Delirium due to general medical condition 12/13/2013  . HCAP (healthcare-associated pneumonia) 12/13/2013  . SIRS (systemic inflammatory response syndrome) (HCC) 12/13/2013  . TBI (traumatic brain injury) (HCC) 12/12/2013    Past Medical History: Past Medical History:  Diagnosis Date  . Cirrhosis (HCC)   . Depression   . DVT (deep venous thrombosis) (HCC)    UE, LE DVT during hospitalization  . GERD (gastroesophageal reflux disease)   . H/O ETOH abuse   . H/O renal calculi   . Seizure (HCC)   . TBI (traumatic brain injury) (HCC)    07/2013, fall from standing post seizure  . Tremor     Past Surgical History: Past Surgical History:  Procedure Laterality Date  . BRAIN SURGERY    . GASTROSTOMY TUBE PLACEMENT    . RADIOLOGY WITH ANESTHESIA N/A 04/20/2017   Procedure: MRI WITH ANESTHESIA OF THE BRAIN;  Surgeon: Radiologist, Medication, MD;  Location: [facility] OR;  Service: Radiology;   Laterality: N/A;  . REMOVAL OF GASTROSTOMY TUBE    . TRACHEOSTOMY     feinstein    Social History: Social History   Tobacco Use  . Smoking status: Former Smoker    Packs/day: 0.25    Years: 20.00    Pack years: 5.00    Types: Cigarettes    Last attempt to quit: 04/17/2017    Years since quitting: 0.1  . Smokeless tobacco: Never Used  Substance Use Topics  . Alcohol use: Yes    Alcohol/week: 0.0 oz  . Drug use: No   No current alcohol use, hx of alcohol abuse though Please also refer to relevant sections of EMR.  Family History: Family History  Problem Relation Age of Onset  . Hypercholesterolemia Mother   . Hypertension Mother   . Congestive Heart Failure Brother        Deceased age 68    Allergies and Medications: Allergies  Allergen Reactions  . Ativan [Lorazepam] Other (See Comments)    Pt has paradoxical effect with sedating medications.   . Lactulose Hives   No current facility-administered medications on file prior to encounter.    Current Outpatient Medications on File Prior to Encounter  Medication Sig Dispense Refill  . amantadine (SYMMETREL) 100 MG capsule TAKE 100 mg CAPSULE BY MOUTH TWICE A DAY    . bromocriptine (PARLODEL) 2.5 MG tablet Take 5 mg by mouth 2 (two) times daily.    . clindamycin (CLEOCIN) 150 MG capsule Take 150 mg by mouth daily.  3  . clonazePAM (KLONOPIN) 1 MG tablet Take 1 mg by mouth at bedtime.  1  . escitalopram (LEXAPRO) 5 MG tablet Take 1 tablet (5 mg total) by mouth daily. 30 tablet 0  . feeding supplement, ENSURE ENLIVE, (ENSURE ENLIVE) LIQD Take 237 mLs 3 (three) times daily between meals by mouth. 237 mL 12  . Lacosamide 100 MG TABS Take 1 tablet (100 mg total) by mouth 2 (two) times daily. 60 tablet 5  . ondansetron (ZOFRAN ODT) 8 MG disintegrating tablet Take 1 tablet (8 mg total) every 8 (eight) hours as needed by mouth for nausea or vomiting. 20 tablet 0  . oxcarbazepine (TRILEPTAL) 600 MG tablet TAKE 1 TABLET BY MOUTH  TWICE A DAY (Patient taking differently: TAKE 600 mg TABLET BY MOUTH TWICE A DAY) 60 tablet 5  . pantoprazole (PROTONIX) 40 MG tablet Take 40 mg by mouth daily.     . polyethylene glycol (MIRALAX / GLYCOLAX) packet Take 17 g by mouth daily as needed for moderate constipation.    . QUEtiapine (SEROQUEL) 25 MG tablet Take 1 tablet (25 mg total) by mouth 2 (two) times daily. 30 tablet 0  . ciprofloxacin (CIPRO) 500 MG tablet Take 1 tablet (500 mg  total) by mouth 2 (two) times daily. (Patient not taking: Reported on 06/03/2017) 20 tablet 0  . metroNIDAZOLE (FLAGYL) 500 MG tablet Take 1 tablet (500 mg total) by mouth 2 (two) times daily. (Patient not taking: Reported on 06/03/2017) 20 tablet 0  . topiramate (TOPAMAX) 25 MG tablet Take 2 tablets twice daily for 7 days, then 1 tablet twice daily for 7 days, then STOP (Patient not taking: Reported on 06/03/2017) 42 tablet 0    Objective: BP 118/77   Pulse 75   Temp 97.9 F (36.6 C) (Rectal)   Resp 19   Ht 5' 8.5" (1.74 m)   Wt 104 lb (47.2 kg)   SpO2 99%   BMI 15.58 kg/m  Exam: General: Thin gentleman lying in bed in no acute distress, alert and interactive but unable to form sentences Eyes: Pupils equal and reactive to light, nonicteric sclera ENTM: Moist mucous membranes, no pharyngeal erythema, TMs normal bilaterally, no lymphadenopathy Neck: Supple, no JVD Cardiovascular: Regular rate and rhythm, normal S1-S2, no murmurs appreciated, no edema, DP pulses palpable bilaterally Respiratory: Slightly increased respiratory rate, no accessory muscle usage, lungs clear to auscultation bilaterally, no crackles or wheezing appreciated Gastrointestinal: Soft, nondistended, nontender, normal bowel sounds MSK: Moves all extremities spontaneously Derm: No rashes Neuro: Alert but unable to form sentences.  Responds with nodding head yes or no, thumbs up and thumbs down, he is also able to say yes no, I do not know, I know.  5 out of 5 strength in bilateral  upper extremities, 4-5 strength in right lower extremity, 5 out of 5 strength in left lower extremity.  When asked if he has numbness on his right arm and right leg he nods his head yes Psych: Unable to accurately assess, does not appear to be responding to internal stimuli  Labs and Imaging: CBC BMET  Recent Labs  Lab 06/03/17 1348  WBC 9.0  HGB 12.7*  HCT 38.7*  PLT 234   Recent Labs  Lab 06/03/17 1348  NA 141  K 3.9  CL 101  CO2 25  BUN 11  CREATININE 1.29*  GLUCOSE 113*  CALCIUM 9.9     Dg Chest 2 View  Result Date: 06/03/2017 CLINICAL DATA:  Cough and fever EXAM: CHEST  2 VIEW COMPARISON:  May 28, 2017 FINDINGS: There is no edema or consolidation. Heart size and pulmonary vascularity are normal. No adenopathy. No bone lesions. IMPRESSION: No edema or consolidation. Electronically Signed   By: Bretta Bang III M.D.   On: 06/03/2017 15:01   Ct Head Wo Contrast  Result Date: 06/03/2017 CLINICAL DATA:  Altered level of consciousness EXAM: CT HEAD WITHOUT CONTRAST TECHNIQUE: Contiguous axial images were obtained from the base of the skull through the vertex without intravenous contrast. Sagittal and coronal MPR images reconstructed from axial data set. COMPARISON:  05/24/2017 FINDINGS: Brain: Mild atrophy. Ex vacuo dilatation of the LEFT lateral ventricle. Large area of encephalomalacia involving the LEFT hemisphere at the LEFT temporal and parietal lobes unchanged. No midline shift or mass effect. Remaining brain parenchyma normal appearance. No evidence of acute intracranial hemorrhage, mass lesion or acute infarction. Vascular: No hyperdense vessels or significant atherosclerotic calcifications Skull: Post LEFT temporal craniotomy.  No acute osseous findings. Sinuses/Orbits: Opacified RIGHT maxillary sinus. Remaining visualized paranasal sinuses and mastoid air cells clear. Other: N/A IMPRESSION: Prior LEFT craniotomy with stable large area of encephalomalacia in LEFT  hemisphere involving the LEFT temporal and parietal lobes. No acute intracranial abnormalities. Electronically Signed  By: Ulyses Southward M.D.   On: 06/03/2017 17:33   Ct Angio Chest Pe W/cm &/or Wo Cm  Result Date: 06/03/2017 CLINICAL DATA:  Acute presentation with shortness of breath. Chest pain and cough. EXAM: CT ANGIOGRAPHY CHEST WITH CONTRAST TECHNIQUE: Multidetector CT imaging of the chest was performed using the standard protocol during bolus administration of intravenous contrast. Multiplanar CT image reconstructions and MIPs were obtained to evaluate the vascular anatomy. CONTRAST:  ISOVUE-370 IOPAMIDOL (ISOVUE-370) INJECTION 76% COMPARISON:  Radiography same day. FINDINGS: Cardiovascular: Pulmonary arterial opacification is good. There are no pulmonary emboli. Mild aortic atherosclerosis without aneurysm or dissection. No visible coronary artery calcification. Heart size is normal. No pericardial fluid. Mediastinum/Nodes: No mediastinal or hilar mass or lymphadenopathy. Lungs/Pleura: Background pattern of centrilobular emphysema. Areas of patchy alveolar filling in the dependent lungs, most pronounced in the left lower lobe with some volume loss. This is most consistent with pneumonia. No dense consolidation or lobar collapse. No pleural fluid. Upper Abdomen: Normal Musculoskeletal: Old compression fracture of T8. Review of the MIP images confirms the above findings. IMPRESSION: No pulmonary emboli. Background pattern of emphysema. Hazy bronchopneumonia in the dependent lungs, most pronounced in the left lower lobe. No dense consolidation or lobar collapse. Aortic Atherosclerosis (ICD10-I70.0). Electronically Signed   By: Paulina Fusi M.D.   On: 06/03/2017 19:04   Ct Abdomen Pelvis W Contrast  Result Date: 06/03/2017 CLINICAL DATA:  Acute presentation with shortness of breath and cough. History of cirrhosis. Recent proctitis. EXAM: CT ABDOMEN AND PELVIS WITH CONTRAST TECHNIQUE: Multidetector CT  imaging of the abdomen and pelvis was performed using the standard protocol following bolus administration of intravenous contrast. CONTRAST:  ISOVUE-370 IOPAMIDOL (ISOVUE-370) INJECTION 76% COMPARISON:  05/28/2017 FINDINGS: Lower chest: See results of chest CT. Hazy bronchopneumonia in both lungs, most advanced in the left lower lobe. No dense consolidation or collapse. Hepatobiliary: Slightly irregular surface of the liver that could go along with early cirrhosis. No calcified gallstones. Pancreas: Normal Spleen: Normal Adrenals/Urinary Tract: Adrenal glands are normal. Kidneys are normal. Bladder is normal. Stomach/Bowel: Very large amount of fecal matter throughout the entire colon. Small bowel pattern is normal. Today's study does not particularly suggest proctitis. Vascular/Lymphatic: Aortic atherosclerosis. No aneurysm. IVC is normal. No retroperitoneal adenopathy. Reproductive: Normal Other: No free fluid or air. Musculoskeletal: Negative IMPRESSION: Very large amount of fecal matter throughout the colon. No other abdominal finding of acute relevance. Aortic atherosclerosis. Possible early changes of cirrhosis. Electronically Signed   By: Paulina Fusi M.D.   On: 06/03/2017 19:08    Beaulah Dinning, MD 06/03/2017, 9:02 PM PGY-3, [facility] FPTS Intern pager: [phone], text pages welcome

## 2017-06-03 NOTE — ED Triage Notes (Signed)
Per Pt and family, Pt is coming from home with complaints of SOB. Pt is only able to answer questions with thumbs up and down. Reports some Chest pain and cough

## 2017-06-03 NOTE — ED Notes (Signed)
Ryland RN in room with pt. Pt appeared to have sei

## 2017-06-03 NOTE — ED Notes (Signed)
Ryland RN in room with pt. Pt appeared to have seizure like activity with shaking according to pt's dad. Pt now back at baseline

## 2017-06-03 NOTE — ED Notes (Signed)
Pt taken to ct 

## 2017-06-03 NOTE — ED Provider Notes (Signed)
[facility] EMERGENCY DEPARTMENT Provider Note   CSN: [id number] Arrival date & time: 06/03/17  1332     History   Chief Complaint Chief Complaint  Patient presents with  . Shortness of Breath    HPI Hector [name] is a 47 y.o. male.  HPI  47 year old male with a history of traumatic brain injury and seizures presents with shortness of breath.  History is limited as the patient does not speak and the history is mostly taken from dad.  He has been having increased seizures, multiple times per day for the last for 5 days.  Is also been having increased work of breathing during that time.  Some cough but not much sputum production.  No significant vomiting.  The patient does not speak so further pain is difficult to elicit but the patient has been holding his right arm and leg and rubbing it sometimes, indicating pain.  The patient has been compliant with his seizure medicines.  He was just discharged with Cipro and Flagyl but did not take them because the father read of certain side effects.  Past Medical History:  Diagnosis Date  . Cirrhosis (HCC)   . Depression   . DVT (deep venous thrombosis) (HCC)    UE, LE DVT during hospitalization  . GERD (gastroesophageal reflux disease)   . H/O ETOH abuse   . H/O renal calculi   . Seizure (HCC)   . TBI (traumatic brain injury) (HCC)    07/2013, fall from standing post seizure  . Tremor     Patient Active Problem List   Diagnosis Date Noted  . Agitation   . Aspiration pneumonia due to gastric secretions (HCC)   . Hypoxia   . Coarse tremors 04/18/2017  . ARF (acute renal failure) (HCC) 04/18/2017  . Hypokalemia   . Hypomagnesemia   . Physical deconditioning   . FTT (failure to thrive) in adult 02/25/2017  . Oropharyngeal dysphagia 01/01/2017  . Orthostatic hypotension 01/01/2017  . Failure to thrive in adult 12/31/2016  . Vomiting   . Seizure (HCC) 06/01/2015  . Alcoholic cirrhosis of liver without ascites  (HCC)   . Dehydration   . Encephalopathy acute 02/13/2015  . Altered mental status   . Ataxia 02/12/2015  . Localization-related symptomatic epilepsy and epileptic syndromes with complex partial seizures, not intractable, without status epilepticus (HCC) 08/16/2014  . Tobacco abuse 08/16/2014  . Acute kidney injury superimposed on CKD (HCC) 06/25/2014  . Dysphagia 06/25/2014  . Centrilobular emphysema (HCC) 01/27/2014  . Tracheostomy in place [facility]) 01/27/2014  . Encounter for family conference with patient present 01/23/2014  . Dvt femoral (deep venous thrombosis) (HCC) 01/05/2014  . Protein-calorie malnutrition, severe (HCC) 01/05/2014  . Alcohol abuse 01/05/2014  . Acute respiratory failure with hypoxia (HCC) 12/20/2013  . Delirium due to general medical condition 12/13/2013  . HCAP (healthcare-associated pneumonia) 12/13/2013  . SIRS (systemic inflammatory response syndrome) (HCC) 12/13/2013  . TBI (traumatic brain injury) (HCC) 12/12/2013    Past Surgical History:  Procedure Laterality Date  . BRAIN SURGERY    . GASTROSTOMY TUBE PLACEMENT    . RADIOLOGY WITH ANESTHESIA N/A 04/20/2017   Procedure: MRI WITH ANESTHESIA OF THE BRAIN;  Surgeon: Radiologist, Medication, MD;  Location: [facility] OR;  Service: Radiology;  Laterality: N/A;  . REMOVAL OF GASTROSTOMY TUBE    . TRACHEOSTOMY     feinstein       Home Medications    Prior to Admission medications   Medication Sig  Start Date End Date Taking? Authorizing Provider  amantadine (SYMMETREL) 100 MG capsule TAKE 100 mg CAPSULE BY MOUTH TWICE A DAY 01/06/17  Yes [provider]  bromocriptine (PARLODEL) 2.5 MG tablet Take 5 mg by mouth 2 (two) times daily.   Yes [provider]  clindamycin (CLEOCIN) 150 MG capsule Take 150 mg by mouth daily. 05/10/17  Yes [provider]  clonazePAM (KLONOPIN) 1 MG tablet Take 1 mg by mouth at bedtime. 12/24/16  Yes [provider]  escitalopram (LEXAPRO) 5 MG  tablet Take 1 tablet (5 mg total) by mouth daily. 04/30/17  Yes Maxie Barb, MD  feeding supplement, ENSURE ENLIVE, (ENSURE ENLIVE) LIQD Take 237 mLs 3 (three) times daily between meals by mouth. 02/28/17  Yes Vassie Loll, MD  Lacosamide 100 MG TABS Take 1 tablet (100 mg total) by mouth 2 (two) times daily. 05/19/17  Yes Jaffe, Adam R, DO  ondansetron (ZOFRAN ODT) 8 MG disintegrating tablet Take 1 tablet (8 mg total) every 8 (eight) hours as needed by mouth for nausea or vomiting. 02/28/17  Yes Vassie Loll, MD  oxcarbazepine (TRILEPTAL) 600 MG tablet TAKE 1 TABLET BY MOUTH TWICE A DAY Patient taking differently: TAKE 600 mg TABLET BY MOUTH TWICE A DAY 05/03/17  Yes Jaffe, Adam R, DO  pantoprazole (PROTONIX) 40 MG tablet Take 40 mg by mouth daily.    Yes [provider]  polyethylene glycol (MIRALAX / GLYCOLAX) packet Take 17 g by mouth daily as needed for moderate constipation.   Yes [provider]  QUEtiapine (SEROQUEL) 25 MG tablet Take 1 tablet (25 mg total) by mouth 2 (two) times daily. 05/25/17  Yes Laveda Abbe, NP  ciprofloxacin (CIPRO) 500 MG tablet Take 1 tablet (500 mg total) by mouth 2 (two) times daily. Patient not taking: Reported on 06/03/2017 05/29/17   Arthor Captain, PA-C  metroNIDAZOLE (FLAGYL) 500 MG tablet Take 1 tablet (500 mg total) by mouth 2 (two) times daily. Patient not taking: Reported on 06/03/2017 05/29/17   Arthor Captain, PA-C  topiramate (TOPAMAX) 25 MG tablet Take 2 tablets twice daily for 7 days, then 1 tablet twice daily for 7 days, then STOP Patient not taking: Reported on 06/03/2017 05/19/17   Drema Dallas, DO    Family History Family History  Problem Relation Age of Onset  . Hypercholesterolemia Mother   . Hypertension Mother   . Congestive Heart Failure Brother        Deceased age 3    Social History Social History   Tobacco Use  . Smoking status: Former Smoker    Packs/day: 0.25    Years: 20.00    Pack years: 5.00      Types: Cigarettes    Last attempt to quit: 04/17/2017    Years since quitting: 0.1  . Smokeless tobacco: Never Used  Substance Use Topics  . Alcohol use: Yes    Alcohol/week: 0.0 oz  . Drug use: No     Allergies   Ativan [lorazepam] and Lactulose   Review of Systems Review of Systems  Unable to perform ROS: Other     Physical Exam Updated Vital Signs BP 132/66   Pulse 84   Temp 97.9 F (36.6 C) (Rectal)   Resp (!) 26   Ht 5' 8.5" (1.74 m)   Wt 47.2 kg (104 lb)   SpO2 96%   BMI 15.58 kg/m   Physical Exam  Constitutional: He appears well-developed.  Frail, chronically ill appearing  HENT:  Head: Normocephalic and atraumatic.  Right Ear: External ear normal.  Left Ear: External ear normal.  Nose: Nose normal.  Eyes: Right eye exhibits no discharge. Left eye exhibits no discharge.  Neck: Neck supple.  Cardiovascular: Normal rate, regular rhythm and normal heart sounds.  Pulmonary/Chest: Tachypnea noted. He has no wheezes.  Abdominal: Soft. There is no tenderness.  Musculoskeletal: He exhibits no edema.  Neurological: He is alert.  Awake, alert but does not follow commands. Moves all 4 extremities but does not participate in neuro exam.  Skin: Skin is warm and dry.  Nursing note and vitals reviewed.    ED Treatments / Results  Labs (all labs ordered are listed, but only abnormal results are displayed) Labs Reviewed  COMPREHENSIVE METABOLIC PANEL - Abnormal; Notable for the following components:      Result Value   Glucose, Bld 113 (*)    Creatinine, Ser 1.29 (*)    Alkaline Phosphatase 142 (*)    All other components within normal limits  CBC WITH DIFFERENTIAL/PLATELET - Abnormal; Notable for the following components:   RBC 4.10 (*)    Hemoglobin 12.7 (*)    HCT 38.7 (*)    All other components within normal limits  D-DIMER, QUANTITATIVE (NOT AT [facility]) - Abnormal; Notable for the following components:   D-Dimer, Quant 0.86 (*)    All other  components within normal limits  URINALYSIS, ROUTINE W REFLEX MICROSCOPIC - Abnormal; Notable for the following components:   APPearance HAZY (*)    Specific Gravity, Urine 1.045 (*)    All other components within normal limits  I-STAT CG4 LACTIC ACID, ED - Abnormal; Notable for the following components:   Lactic Acid, Venous 2.71 (*)    All other components within normal limits  I-STAT ARTERIAL BLOOD GAS, ED - Abnormal; Notable for the following components:   pH, Arterial 7.588 (*)    pCO2 arterial 25.2 (*)    Acid-Base Excess 3.0 (*)    All other components within normal limits  CULTURE, BLOOD (ROUTINE X 2)  CULTURE, BLOOD (ROUTINE X 2)  INFLUENZA PANEL BY PCR (TYPE A & B)  I-STAT CG4 LACTIC ACID, ED  I-STAT TROPONIN, ED    EKG  EKG Interpretation  Date/Time:  Friday June 03 2017 13:38:48 EST Ventricular Rate:  79 PR Interval:  126 QRS Duration: 78 QT Interval:  374 QTC Calculation: 428 R Axis:   87 Text Interpretation:  Normal sinus rhythm Artifact no significant change since May 24 2017 Confirmed by Pricilla Loveless [phone]) on 06/03/2017 5:58:35 PM       Radiology Dg Chest 2 View  Result Date: 06/03/2017 CLINICAL DATA:  Cough and fever EXAM: CHEST  2 VIEW COMPARISON:  May 28, 2017 FINDINGS: There is no edema or consolidation. Heart size and pulmonary vascularity are normal. No adenopathy. No bone lesions. IMPRESSION: No edema or consolidation. Electronically Signed   By: Bretta Bang III M.D.   On: 06/03/2017 15:01   Ct Head Wo Contrast  Result Date: 06/03/2017 CLINICAL DATA:  Altered level of consciousness EXAM: CT HEAD WITHOUT CONTRAST TECHNIQUE: Contiguous axial images were obtained from the base of the skull through the vertex without intravenous contrast. Sagittal and coronal MPR images reconstructed from axial data set. COMPARISON:  05/24/2017 FINDINGS: Brain: Mild atrophy. Ex vacuo dilatation of the LEFT lateral ventricle. Large area of encephalomalacia  involving the LEFT hemisphere at the LEFT temporal and parietal lobes unchanged. No midline shift or mass effect. Remaining brain parenchyma normal  appearance. No evidence of acute intracranial hemorrhage, mass lesion or acute infarction. Vascular: No hyperdense vessels or significant atherosclerotic calcifications Skull: Post LEFT temporal craniotomy.  No acute osseous findings. Sinuses/Orbits: Opacified RIGHT maxillary sinus. Remaining visualized paranasal sinuses and mastoid air cells clear. Other: N/A IMPRESSION: Prior LEFT craniotomy with stable large area of encephalomalacia in LEFT hemisphere involving the LEFT temporal and parietal lobes. No acute intracranial abnormalities. Electronically Signed   By: Ulyses Southward M.D.   On: 06/03/2017 17:33   Ct Angio Chest Pe W/cm &/or Wo Cm  Result Date: 06/03/2017 CLINICAL DATA:  Acute presentation with shortness of breath. Chest pain and cough. EXAM: CT ANGIOGRAPHY CHEST WITH CONTRAST TECHNIQUE: Multidetector CT imaging of the chest was performed using the standard protocol during bolus administration of intravenous contrast. Multiplanar CT image reconstructions and MIPs were obtained to evaluate the vascular anatomy. CONTRAST:  ISOVUE-370 IOPAMIDOL (ISOVUE-370) INJECTION 76% COMPARISON:  Radiography same day. FINDINGS: Cardiovascular: Pulmonary arterial opacification is good. There are no pulmonary emboli. Mild aortic atherosclerosis without aneurysm or dissection. No visible coronary artery calcification. Heart size is normal. No pericardial fluid. Mediastinum/Nodes: No mediastinal or hilar mass or lymphadenopathy. Lungs/Pleura: Background pattern of centrilobular emphysema. Areas of patchy alveolar filling in the dependent lungs, most pronounced in the left lower lobe with some volume loss. This is most consistent with pneumonia. No dense consolidation or lobar collapse. No pleural fluid. Upper Abdomen: Normal Musculoskeletal: Old compression fracture of T8.  Review of the MIP images confirms the above findings. IMPRESSION: No pulmonary emboli. Background pattern of emphysema. Hazy bronchopneumonia in the dependent lungs, most pronounced in the left lower lobe. No dense consolidation or lobar collapse. Aortic Atherosclerosis (ICD10-I70.0). Electronically Signed   By: Paulina Fusi M.D.   On: 06/03/2017 19:04   Ct Abdomen Pelvis W Contrast  Result Date: 06/03/2017 CLINICAL DATA:  Acute presentation with shortness of breath and cough. History of cirrhosis. Recent proctitis. EXAM: CT ABDOMEN AND PELVIS WITH CONTRAST TECHNIQUE: Multidetector CT imaging of the abdomen and pelvis was performed using the standard protocol following bolus administration of intravenous contrast. CONTRAST:  ISOVUE-370 IOPAMIDOL (ISOVUE-370) INJECTION 76% COMPARISON:  05/28/2017 FINDINGS: Lower chest: See results of chest CT. Hazy bronchopneumonia in both lungs, most advanced in the left lower lobe. No dense consolidation or collapse. Hepatobiliary: Slightly irregular surface of the liver that could go along with early cirrhosis. No calcified gallstones. Pancreas: Normal Spleen: Normal Adrenals/Urinary Tract: Adrenal glands are normal. Kidneys are normal. Bladder is normal. Stomach/Bowel: Very large amount of fecal matter throughout the entire colon. Small bowel pattern is normal. Today's study does not particularly suggest proctitis. Vascular/Lymphatic: Aortic atherosclerosis. No aneurysm. IVC is normal. No retroperitoneal adenopathy. Reproductive: Normal Other: No free fluid or air. Musculoskeletal: Negative IMPRESSION: Very large amount of fecal matter throughout the colon. No other abdominal finding of acute relevance. Aortic atherosclerosis. Possible early changes of cirrhosis. Electronically Signed   By: Paulina Fusi M.D.   On: 06/03/2017 19:08    Procedures Procedures (including critical care time)  Medications Ordered in ED Medications  vancomycin (VANCOCIN) IVPB 1000 mg/200  mL premix (1,000 mg Intravenous New Bag/Given 06/03/17 2031)  vancomycin (VANCOCIN) IVPB 750 mg/150 ml premix (not administered)  ceFEPIme (MAXIPIME) 1 g in dextrose 5 % 50 mL IVPB (not administered)  sodium chloride 0.9 % bolus 1,000 mL (0 mLs Intravenous Stopped 06/03/17 2031)  midazolam (VERSED) injection 2 mg (2 mg Intravenous Given 06/03/17 1828)  iopamidol (ISOVUE-370) 76 % injection (100 mLs  Contrast Given 06/03/17 1837)  ceFEPIme (MAXIPIME) 1 g in dextrose 5 % 50 mL IVPB (0 g Intravenous Stopped 06/03/17 2031)     Initial Impression / Assessment and Plan / ED Course  I have reviewed the triage vital signs and the nursing notes.  Pertinent labs & imaging results that were available during my care of the patient were reviewed by me and considered in my medical decision making (see chart for details).     A limited due to the patient's mental status.  However he is quite tachypneic and while he is not hypoxic a workup was obtained for this dyspnea.  He is found to have pneumonia.  This would be hospital-acquired as he was admitted about just over a month ago.  He is not septic but with his significant tachypnea and poor baseline status I think he needs admission for antibiotics.  He had an episode of shaking prior to me seeing him but has not had any further seizure-like activity.  He grimaced some on her abdomen exam so obtained given recent proctocolitis but this is otherwise unremarkable.  Admit to family practice.  Final Clinical Impressions(s) / ED Diagnoses   Final diagnoses:  HCAP (healthcare-associated pneumonia)    ED Discharge Orders    None       Pricilla LovelessGoldston, Addisynn Vassell, MD 06/03/17 2044

## 2017-06-03 NOTE — Progress Notes (Signed)
Pharmacy Antibiotic Note  Hector NianRobert W [name] is a 47 y.o. male admitted on 06/03/2017 with pneumonia.  Pharmacy has been consulted for vancomycin dosing.  SCr 1.29, CrCl ~4345ml/min.  Plan: Give vancomycin 1g IV x 1, then start vancomycin 750mg  IV Q24h Start cefepime 1g IV Q24h Monitor clinical picture, renal function, VT prn F/U C&S, abx deescalation / LOT  Height: 5' 8.5" (174 cm) Weight: 104 lb (47.2 kg) IBW/kg (Calculated) : 69.55  Temp (24hrs), Avg:98.4 F (36.9 C), Min:97.9 F (36.6 C), Max:98.8 F (37.1 C)  Recent Labs  Lab 05/28/17 2026 06/03/17 1348 06/03/17 1419 06/03/17 1620  WBC 7.7 9.0  --   --   CREATININE 1.35* 1.29*  --   --   LATICACIDVEN  --   --  2.71* 1.81    Estimated Creatinine Clearance: 46.8 mL/min (A) (by C-G formula based on SCr of 1.29 mg/dL (H)).    Allergies  Allergen Reactions  . Ativan [Lorazepam] Other (See Comments)    Pt has paradoxical effect with sedating medications.   . Lactulose Hives    Thank you for allowing pharmacy to be a part of this patient's care.  Armandina StammerBATCHELDER,[place] Paiz J 06/03/2017 7:28 PM

## 2017-06-04 ENCOUNTER — Inpatient Hospital Stay (HOSPITAL_COMMUNITY): Payer: Medicaid Other

## 2017-06-04 DIAGNOSIS — R569 Unspecified convulsions: Secondary | ICD-10-CM

## 2017-06-04 DIAGNOSIS — J189 Pneumonia, unspecified organism: Secondary | ICD-10-CM

## 2017-06-04 LAB — COMPREHENSIVE METABOLIC PANEL
ALT: 23 U/L (ref 17–63)
AST: 24 U/L (ref 15–41)
Albumin: 3.1 g/dL — ABNORMAL LOW (ref 3.5–5.0)
Alkaline Phosphatase: 112 U/L (ref 38–126)
Anion gap: 10 (ref 5–15)
BUN: 8 mg/dL (ref 6–20)
CO2: 24 mmol/L (ref 22–32)
Calcium: 9 mg/dL (ref 8.9–10.3)
Chloride: 107 mmol/L (ref 101–111)
Creatinine, Ser: 1.15 mg/dL (ref 0.61–1.24)
GFR calc Af Amer: >60 mL/min (ref >60)
GFR calc non Af Amer: >60 mL/min (ref >60)
Glucose, Bld: 90 mg/dL (ref 65–99)
Potassium: 3.8 mmol/L (ref 3.5–5.1)
Sodium: 141 mmol/L (ref 135–145)
Total Bilirubin: 0.6 mg/dL (ref 0.3–1.2)
Total Protein: 6.5 g/dL (ref 6.5–8.1)

## 2017-06-04 LAB — CBC
HCT: 34.2 % — ABNORMAL LOW (ref 39.0–52.0)
HEMOGLOBIN: 11.1 g/dL — AB (ref 13.0–17.0)
MCH: 30.7 pg (ref 26.0–34.0)
MCHC: 32.5 g/dL (ref 30.0–36.0)
MCV: 94.7 fL (ref 78.0–100.0)
Platelets: 186 10*3/uL (ref 150–400)
RBC: 3.61 MIL/uL — AB (ref 4.22–5.81)
RDW: 12.6 % (ref 11.5–15.5)
WBC: 9.9 10*3/uL (ref 4.0–10.5)

## 2017-06-04 MED ORDER — LACOSAMIDE 50 MG PO TABS
150.0000 mg | ORAL_TABLET | Freq: Two times a day (BID) | ORAL | Status: DC
  Administered 2017-06-04 – 2017-06-06 (×4): 150 mg via ORAL
  Filled 2017-06-04 (×4): qty 3

## 2017-06-04 NOTE — Evaluation (Signed)
Occupational Therapy Evaluation Patient Details Name: Hector [name] MRN: [id number] DOB: [birth year] Today's Date: 06/04/2017    History of Present Illness Hector [name] is a 47 y.o. male presenting with cough, dyspnea, increased seizure activity. PMH is significant for traumatic brain injury, seizure disorder, aphasia, right-sided weakness, alcoholic cirrhosis, dysphasia   Clinical Impression   Per chart review, pt was living with his parents and received assistance for ADLs PTA. Pt currently requiring Min A for LB ADLs and ADLs at sink due to decreased balance and impulsivity. Pt would benefit from further acute OT to facilitate safe, increase independence, and decrease fall risk during ADLs. Recommend dc home once medically stable per physician.     Follow Up Recommendations  No OT follow up    Equipment Recommendations  None recommended by OT    Recommendations for Other Services       Precautions / Restrictions Precautions Precautions: Fall Restrictions Weight Bearing Restrictions: No      Mobility Bed Mobility Overal bed mobility: Modified Independent             General bed mobility comments: In recliner upon arrival  Transfers Overall transfer level: Needs assistance Equipment used: Rolling walker (2 wheeled);None Transfers: Sit to/from Stand Sit to Stand: Min guard         General transfer comment: pt stands impuslively EOB as soon as he sits up, Min Guard A for safety and vc's to sit back down to prepare lines. Pt following commands    Balance Overall balance assessment: Needs assistance Sitting-balance support: No upper extremity supported Sitting balance-Leahy Scale: Good     Standing balance support: No upper extremity supported Standing balance-Leahy Scale: Fair Standing balance comment: expect his balance is close to baseline                           ADL either performed or assessed with clinical judgement   ADL Overall  ADL's : Needs assistance/impaired     Grooming: Oral care;Minimal assistance;Standing Grooming Details (indicate cue type and reason): Min A to squeeze tooth paste on to tooth brush. Pt also requiring Min A for standing balance due to posterior lean and impulsivity Upper Body Bathing: Set up;Supervision/ safety;Sitting   Lower Body Bathing: Minimal assistance;Sit to/from stand   Upper Body Dressing : Set up;Supervision/safety;Sitting   Lower Body Dressing: Minimal assistance;Sit to/from stand   Toilet Transfer: Ambulation;Min guard;RW(Simulated in room)           Functional mobility during ADLs: Min guard;Minimal assistance;Rolling walker General ADL Comments: Pt performing near baseline funciton. However, requiring Min A during grooming at sink due to impulsivity and posterior lean. Pt with single LOB and required physical A to correct balance.      Vision         Perception     Praxis      Pertinent Vitals/Pain Pain Assessment: No/denies pain     Hand Dominance Right   Extremity/Trunk Assessment Upper Extremity Assessment Upper Extremity Assessment: Overall WFL for tasks assessed   Lower Extremity Assessment Lower Extremity Assessment: Defer to PT evaluation   Cervical / Trunk Assessment Cervical / Trunk Assessment: Normal   Communication Communication Communication: Expressive difficulties   Cognition Arousal/Alertness: Awake/alert Behavior During Therapy: Impulsive Overall Cognitive Status: History of cognitive impairments - at baseline  General Comments       Exercises     Shoulder Instructions      Home Living Family/patient expects to be discharged to:: Private residence Living Arrangements: Parent Available Help at Discharge: Family                             Additional Comments: no family present for info. From previous admission know that he was living with his father and was  performing most ADL's and seems that he has been ambulating with a RW      Prior Functioning/Environment Level of Independence: Needs assistance  Gait / Transfers Assistance Needed: unsure ADL's / Homemaking Assistance Needed: Pt reporting that family assists with ADLs including dressing and bathing   Comments: pt only says "uh huh" and does some hand gestures        OT Problem List: Impaired balance (sitting and/or standing);Decreased safety awareness;Decreased knowledge of use of DME or AE      OT Treatment/Interventions: Self-care/ADL training;Therapeutic exercise;Energy conservation;DME and/or AE instruction;Therapeutic activities;Patient/family education    OT Goals(Current goals can be found in the care plan section) Acute Rehab OT Goals Patient Stated Goal: none stated OT Goal Formulation: With patient Time For Goal Achievement: 06/18/17 Potential to Achieve Goals: Good ADL Goals Pt Will Perform Grooming: with modified independence;standing Pt Will Perform Lower Body Dressing: with modified independence;sit to/from stand Pt Will Transfer to Toilet: with modified independence;ambulating;regular height toilet  OT Frequency: Min 2X/week   Barriers to D/C:            Co-evaluation              AM-PAC PT "6 Clicks" Daily Activity     Outcome Measure Help from another person eating meals?: None Help from another person taking care of personal grooming?: A Little Help from another person toileting, which includes using toliet, bedpan, or urinal?: A Little Help from another person bathing (including washing, rinsing, drying)?: A Little Help from another person to put on and taking off regular upper body clothing?: None Help from another person to put on and taking off regular lower body clothing?: A Little 6 Click Score: 20   End of Session Equipment Utilized During Treatment: Rolling walker Nurse Communication: Mobility status  Activity Tolerance: Patient  tolerated treatment well Patient left: in chair;with call bell/phone within reach;with chair alarm set  OT Visit Diagnosis: Unsteadiness on feet (R26.81);Other abnormalities of gait and mobility (R26.89);Muscle weakness (generalized) (M62.81)                Time: [id number] OT Time Calculation (min): 10 min Charges:  OT General Charges $OT Visit: 1 Visit OT Evaluation $OT Eval Moderate Complexity: 1 Mod G-Codes:     Hector [name] MSOT, OTR/L Acute Rehab Pager: [phone] Office: [phone]  Theodoro GristCharis M Eidan [name] 06/04/2017, 4:25 PM

## 2017-06-04 NOTE — Progress Notes (Signed)
OT Cancellation    06/04/17 1400  OT Visit Information  Last OT Received On 06/04/17  Reason Eval/Treat Not Completed Patient at procedure or test/ unavailable (Off the floor for barium swallow study. Will return as schedule allows.)   Jisselle Poth MSOT, OTR/L Acute Rehab Pager: [phone] [phone]

## 2017-06-04 NOTE — Evaluation (Signed)
Physical Therapy Evaluation Patient Details Name: Hector [name] MRN: [id number] DOB: 05/03/1970 Today's Date: 06/04/2017   History of Present Illness  Hector [name] is a 47 y.o. male presenting with cough, dyspnea, increased seizure activity. PMH is significant for traumatic brain injury, seizure disorder, aphasia, right-sided weakness, alcoholic cirrhosis, dysphasia  Clinical Impression  Pt admitted with above diagnosis. Pt currently with functional limitations due to the deficits listed below (see PT Problem List). Pt needed several standing rest breaks due to fatigue with 300' ambulation with RW but O2 sats maintained at 96% on RA and HR fluctuations between 60 bpm and low 100's. Will plan to follow acutely but do not anticipate PT needs at d/c. Plan to return to prior living arrangement though family not present on eval to discuss this. Pt will benefit from skilled PT to increase their independence and safety with mobility to allow discharge to the venue listed below.       Follow Up Recommendations No PT follow up    Equipment Recommendations  None recommended by PT    Recommendations for Other Services       Precautions / Restrictions Precautions Precautions: Fall Restrictions Weight Bearing Restrictions: No      Mobility  Bed Mobility Overal bed mobility: Modified Independent             General bed mobility comments: pt able to get to EOB without assist  Transfers Overall transfer level: Needs assistance Equipment used: Rolling walker (2 wheeled);None Transfers: Sit to/from Stand Sit to Stand: Supervision         General transfer comment: pt stands impuslively EOB as soon as he sits up, supervision for safety and vc's to sit back down to prepare lines. Pt following commands  Ambulation/Gait Ambulation/Gait assistance: Min assist Ambulation Distance (Feet): 300 Feet Assistive device: Rolling walker (2 wheeled) Gait Pattern/deviations: Step-through  pattern;Ataxic     General Gait Details: ataxic gait with RW but no LOB. Pt needed 5 standing rest breaks throughout ambulation for fatigue. O2 sats remained 96% on RA. HR fluctuated 60's to low 100's.   Stairs            Wheelchair Mobility    Modified Rankin (Stroke Patients Only)       Balance Overall balance assessment: Needs assistance Sitting-balance support: No upper extremity supported Sitting balance-Leahy Scale: Good     Standing balance support: No upper extremity supported Standing balance-Leahy Scale: Fair Standing balance comment: expect his balance is close to baseline                             Pertinent Vitals/Pain Pain Assessment: No/denies pain    Home Living Family/patient expects to be discharged to:: Unsure Living Arrangements: Parent               Additional Comments: no family present for info. From previous admission know that he was living with his father and was performing most ADL's and seems that he has been ambulating with a RW    Prior Function Level of Independence: Needs assistance   Gait / Transfers Assistance Needed: unsure     Comments: pt only says "uh huh" and does some hand gestures     Hand Dominance   Dominant Hand: Right    Extremity/Trunk Assessment   Upper Extremity Assessment Upper Extremity Assessment: Defer to OT evaluation    Lower Extremity Assessment Lower Extremity Assessment: Difficult to assess due  to impaired cognition    Cervical / Trunk Assessment Cervical / Trunk Assessment: Normal  Communication   Communication: Expressive difficulties  Cognition Arousal/Alertness: Awake/alert Behavior During Therapy: Impulsive Overall Cognitive Status: History of cognitive impairments - at baseline                                        General Comments      Exercises     Assessment/Plan    PT Assessment Patient needs continued PT services  PT Problem List  Cardiopulmonary status limiting activity;Decreased activity tolerance;Decreased balance;Decreased mobility;Decreased coordination;Decreased cognition;Decreased knowledge of precautions       PT Treatment Interventions DME instruction;Gait training;Functional mobility training;Therapeutic activities;Therapeutic exercise;Stair training;Balance training;Patient/family education    PT Goals (Current goals can be found in the Care Plan section)  Acute Rehab PT Goals Patient Stated Goal: none stated PT Goal Formulation: With patient Time For Goal Achievement: 06/18/17 Potential to Achieve Goals: Good    Frequency Min 3X/week   Barriers to discharge        Co-evaluation               AM-PAC PT "6 Clicks" Daily Activity  Outcome Measure Difficulty turning over in bed (including adjusting bedclothes, sheets and blankets)?: None Difficulty moving from lying on back to sitting on the side of the bed? : None Difficulty sitting down on and standing up from a chair with arms (e.g., wheelchair, bedside commode, etc,.)?: A Little Help needed moving to and from a bed to chair (including a wheelchair)?: A Little Help needed walking in hospital room?: A Little Help needed climbing 3-5 steps with a railing? : A Little 6 Click Score: 20    End of Session Equipment Utilized During Treatment: Gait belt Activity Tolerance: Patient tolerated treatment well Patient left: in chair;with call bell/phone within reach;with chair alarm set Nurse Communication: Mobility status PT Visit Diagnosis: Unsteadiness on feet (R26.81);Ataxic gait (R26.0)    Time: [id number] PT Time Calculation (min) (ACUTE ONLY): 23 min   Charges:   PT Evaluation $PT Eval Moderate Complexity: 1 Mod PT Treatments $Gait Training: 8-22 mins   PT G Codes:        Lyanne CoVictoria Sladen Plancarte, PT  Acute Rehab Services  [phone]   [place]sorVictoria L Mikie Misner 06/04/2017, 1:38 PM

## 2017-06-04 NOTE — Progress Notes (Signed)
Family Medicine Teaching Service Daily Progress Note Intern Pager: [phone]  Patient name: Hector [name] Medical record number: [id number] Date of birth: 1970-10-17 Age: 47 y.o. Gender: male  Primary Care Provider: Medicine, [facility]: None Code Status: Full  Pt Overview and Major Events to Date:  2/8 - admitted for shortness of breath, increased seizures  Assessment and Plan: Hector [name] is a 47 y.o. male presenting with cough, dyspnea, increased seizure activity x5d. PMH is significant for traumatic brain injury, seizure disorder, aphasia, right-sided weakness, alcoholic cirrhosis, dysphasia.  Healthcare-acquired vs. Aspiration Pneumonia L lower bronchopneumonia evidenced on CT Chest. VSS overnight and continues to be afebrile, stable on room air. WBC 9.0>9.9. Lactic acidosis now resolved. Blood cultures pending. Obtaining sputum cultures. Healthcare acquired due to 4 day ED stay a few weeks ago (1/29-1/30 and 2/2-2/3) vs. Aspiration due to dysphagia and increased seizures. Continues on Vanc/Zosyn. On exam, patient with resting tremor in all extremities which appears to be his baseline. Lungs CTAB with comfortable work of breathing on room air. Will continue antibiotics with cultures pending, likely infection precipitated worsened seizures. - Continue Vanc/Zosyn (2/8 - ) - Supplemental O2 as needed - Blood cultures pending - sputum culture if able to produce sputum - Daily BMP and CBC  Traumatic brain injury/seizure disorder/aphasia/dementia TBI, aphasia, R sided weakness due to fall after seizure in 2015. Typical seizures are full body tonic-clonic. Follows with neurology outpatient. Has ?allergic reaction to Ativan, so will likely consider different short-acting benzo if patient has seizure while admitted. Consider reaching out to Neurology regarding medication regimen.  No seizures noted overnight. - Continue home medications: amantadine 100 mg  twice daily, bromocriptine 5 mg twice daily, Klonopin 1 mg nightly, Lexapro 5 mg daily, lacosamide 100 mg twice daily, Trileptal 600 mg twice daily, Seroquel 25 mg twice daily - Seizure precautions - PT/OT  History of dysphasia Secondary to fall after seizure in 2015. Apparently was on dysphasia 3 diet in the past, obtaining SLP consult. Interviews limited due to dysphagia and TBI. - Continue dysphagia 3 diet - SLP consult  History of alcoholic cirrhosis Previous alcoholic. No need for CIWA at this time due to no alcohol use in years per father. ALP is elevated to 142, AST and ALT within normal limits. - monitor  Constipation Evidenced via CT abdomen on admission with last BM 2/7 per father. On exam, tender to palpation diffusely. - continue Miralax  Mild AKI: Creatinine 1.29>1.15. Possibly prerenal in etiology vs CKD (CKD on past medical history list) - Encourage PO - monitor with BMP  History of protein calorie malnutrition Normal albumin of 3.7 on admission.  He does appear thin on exam and drinks mostly Ensure as per father's report - Continue Ensure  GERD Stable on home regimen - Continue home Protonix 40 mg daily  FEN/GI: Dysphasia 3 diet, saline lock IV Prophylaxis: Lovenox  Disposition: continue inpatient management of pneumonia  Subjective:  Patient unable to speak legibly but can give thumb up or down for yes or no questions. Endorsing abdominal tenderness on palpation. No chest pain or difficulty breathing.  Objective: Temp:  [97.7 F (36.5 C)-98.8 F (37.1 C)] 97.7 F (36.5 C) (02/09 0644) Pulse Rate:  [58-113] 58 (02/09 0644) Resp:  [18-51] 18 (02/09 0644) BP: (101-157)/(55-79) 107/69 (02/09 0644) SpO2:  [83 %-100 %] 98 % (02/09 0644) Weight:  [104 lb (47.2 kg)-105 lb 2.6 oz (47.7 kg)] 105 lb 2.6 oz (47.7 kg) (02/08 2145) Physical Exam: General:  thin male sitting in bed, in NAD Cardiovascular: RRR, no murmurs/rubs/gallops Respiratory: CTAB, no  wheezes/rales/rhonchi Abdomen: soft, tender to palpation diffusely, +BS Extremities: warm and well perfused, baseline tremor  Laboratory: Recent Labs  Lab 05/28/17 2026 06/03/17 1348 06/04/17 0313  WBC 7.7 9.0 9.9  HGB 10.4* 12.7* 11.1*  HCT 31.0* 38.7* 34.2*  PLT 126* 234 186   Recent Labs  Lab 05/28/17 2026 06/03/17 1348 06/04/17 0313  NA 141 141 141  K 3.5 3.9 3.8  CL 112* 101 107  CO2 21* 25 24  BUN 19 11 8   CREATININE 1.35* 1.29* 1.15  CALCIUM 9.2 9.9 9.0  PROT 7.3 7.7 6.5  BILITOT 0.5 0.4 0.6  ALKPHOS 130* 142* 112  ALT 19 28 23   AST 23 36 24  GLUCOSE 114* 113* 90   Imaging/Diagnostic Tests: Dg Chest 2 View  Result Date: 06/03/2017 CLINICAL DATA:  Cough and fever EXAM: CHEST  2 VIEW COMPARISON:  May 28, 2017 FINDINGS: There is no edema or consolidation. Heart size and pulmonary vascularity are normal. No adenopathy. No bone lesions. IMPRESSION: No edema or consolidation. Electronically Signed   By: Bretta Bang III M.D.   On: 06/03/2017 15:01   Ct Head Wo Contrast  Result Date: 06/03/2017 CLINICAL DATA:  Altered level of consciousness EXAM: CT HEAD WITHOUT CONTRAST TECHNIQUE: Contiguous axial images were obtained from the base of the skull through the vertex without intravenous contrast. Sagittal and coronal MPR images reconstructed from axial data set. COMPARISON:  05/24/2017 FINDINGS: Brain: Mild atrophy. Ex vacuo dilatation of the LEFT lateral ventricle. Large area of encephalomalacia involving the LEFT hemisphere at the LEFT temporal and parietal lobes unchanged. No midline shift or mass effect. Remaining brain parenchyma normal appearance. No evidence of acute intracranial hemorrhage, mass lesion or acute infarction. Vascular: No hyperdense vessels or significant atherosclerotic calcifications Skull: Post LEFT temporal craniotomy.  No acute osseous findings. Sinuses/Orbits: Opacified RIGHT maxillary sinus. Remaining visualized paranasal sinuses and mastoid  air cells clear. Other: N/A IMPRESSION: Prior LEFT craniotomy with stable large area of encephalomalacia in LEFT hemisphere involving the LEFT temporal and parietal lobes. No acute intracranial abnormalities. Electronically Signed   By: Ulyses Southward M.D.   On: 06/03/2017 17:33   Ct Angio Chest Pe W/cm &/or Wo Cm  Result Date: 06/03/2017 CLINICAL DATA:  Acute presentation with shortness of breath. Chest pain and cough. EXAM: CT ANGIOGRAPHY CHEST WITH CONTRAST TECHNIQUE: Multidetector CT imaging of the chest was performed using the standard protocol during bolus administration of intravenous contrast. Multiplanar CT image reconstructions and MIPs were obtained to evaluate the vascular anatomy. CONTRAST:  ISOVUE-370 IOPAMIDOL (ISOVUE-370) INJECTION 76% COMPARISON:  Radiography same day. FINDINGS: Cardiovascular: Pulmonary arterial opacification is good. There are no pulmonary emboli. Mild aortic atherosclerosis without aneurysm or dissection. No visible coronary artery calcification. Heart size is normal. No pericardial fluid. Mediastinum/Nodes: No mediastinal or hilar mass or lymphadenopathy. Lungs/Pleura: Background pattern of centrilobular emphysema. Areas of patchy alveolar filling in the dependent lungs, most pronounced in the left lower lobe with some volume loss. This is most consistent with pneumonia. No dense consolidation or lobar collapse. No pleural fluid. Upper Abdomen: Normal Musculoskeletal: Old compression fracture of T8. Review of the MIP images confirms the above findings. IMPRESSION: No pulmonary emboli. Background pattern of emphysema. Hazy bronchopneumonia in the dependent lungs, most pronounced in the left lower lobe. No dense consolidation or lobar collapse. Aortic Atherosclerosis (ICD10-I70.0). Electronically Signed   By: Paulina Fusi M.D.   On:  06/03/2017 19:04   Ct Abdomen Pelvis W Contrast  Result Date: 06/03/2017 CLINICAL DATA:  Acute presentation with shortness of breath and  cough. History of cirrhosis. Recent proctitis. EXAM: CT ABDOMEN AND PELVIS WITH CONTRAST TECHNIQUE: Multidetector CT imaging of the abdomen and pelvis was performed using the standard protocol following bolus administration of intravenous contrast. CONTRAST:  100mL ISOVUE-370 IOPAMIDOL (ISOVUE-370) INJECTION 76% COMPARISON:  05/28/2017 FINDINGS: Lower chest: See results of chest CT. Hazy bronchopneumonia in both lungs, most advanced in the left lower lobe. No dense consolidation or collapse. Hepatobiliary: Slightly irregular surface of the liver that could go along with early cirrhosis. No calcified gallstones. Pancreas: Normal Spleen: Normal Adrenals/Urinary Tract: Adrenal glands are normal. Kidneys are normal. Bladder is normal. Stomach/Bowel: Very large amount of fecal matter throughout the entire colon. Small bowel pattern is normal. Today's study does not particularly suggest proctitis. Vascular/Lymphatic: Aortic atherosclerosis. No aneurysm. IVC is normal. No retroperitoneal adenopathy. Reproductive: Normal Other: No free fluid or air. Musculoskeletal: Negative IMPRESSION: Very large amount of fecal matter throughout the colon. No other abdominal finding of acute relevance. Aortic atherosclerosis. Possible early changes of cirrhosis. Electronically Signed   By: Paulina FusiMark  Shogry M.D.   On: 06/03/2017 19:08   Ellwood Denseumball, Ginnie Marich, DO 06/04/2017, 9:39 AM PGY-1, [place] [facility] FPTS Intern pager: [phone], text pages welcome

## 2017-06-04 NOTE — Evaluation (Signed)
Clinical/Bedside Swallow Evaluation Patient Details  Name: Hector [name] MRN: [id number] Date of Birth: [birth year]  Today's Date: 06/04/2017 Time: SLP Start Time (ACUTE ONLY): 1125 SLP Stop Time (ACUTE ONLY): 1145 SLP Time Calculation (min) (ACUTE ONLY): 20 min  Past Medical History:  Past Medical History:  Diagnosis Date  . Cirrhosis (HCC)   . Depression   . DVT (deep venous thrombosis) (HCC)    UE, LE DVT during hospitalization  . GERD (gastroesophageal reflux disease)   . H/O ETOH abuse   . H/O renal calculi   . Seizure (HCC)   . TBI (traumatic brain injury) (HCC)    07/2013, fall from standing post seizure  . Tremor    Past Surgical History:  Past Surgical History:  Procedure Laterality Date  . BRAIN SURGERY    . GASTROSTOMY TUBE PLACEMENT    . RADIOLOGY WITH ANESTHESIA N/A 04/20/2017   Procedure: MRI WITH ANESTHESIA OF THE BRAIN;  Surgeon: Radiologist, Medication, MD;  Location: [facility] OR;  Service: Radiology;  Laterality: N/A;  . REMOVAL OF GASTROSTOMY TUBE    . TRACHEOSTOMY     feinstein   HPI:  Patient is a 47 y.o. male who presented to hospital with cough, dyspnea and increased seizure activity. PMH: TBI, seizure disorder, aphasia, right-sided weakness, alcoholic cirrhosis, dysphagia.   Assessment / Plan / Recommendation Clinical Impression  Patient presents with a mild oropharyngeal dysphagia with delayed swallow initiation and delayed throat clearing when drinking thin liquids and eating hard solids (graham crackers). Patient with h/o dysphagia with most recent MBS completed on 04/27/17, which had recommended Dys 3, nectar thick liquids, but then patient was upgraded during that admission to Regular, Thin liquids. Based on current presentation, MD concerns for questionable aspiration PNA, and premorbid dysphagia, plan for MBS to determine current swallow function and r/o silent aspiration.  SLP Visit Diagnosis: Dysphagia, oropharyngeal phase (R13.12)    Aspiration  Risk  Mild aspiration risk    Diet Recommendation Dysphagia 3 (Mech soft);Thin liquid   Liquid Administration via: Cup Medication Administration: Whole meds with puree Supervision: Patient able to self feed Compensations: Minimize environmental distractions;Slow rate;Small sips/bites    Other  Recommendations Oral Care Recommendations: Oral care BID   Follow up Recommendations None(TBD pending progress)      Frequency and Duration min 1 x/week  1 week       Prognosis Prognosis for Safe Diet Advancement: Good Barriers to Reach Goals: Cognitive deficits      Swallow Study   General Date of Onset: 06/03/17 HPI: Patient is a 47 y.o. male who presented to hospital with cough, dyspnea and increased seizure activity. PMH: TBI, seizure disorder, aphasia, right-sided weakness, alcoholic cirrhosis, dysphagia. Type of Study: Bedside Swallow Evaluation Previous Swallow Assessment: MBS in January of 2019 recommending Dys 3, Nectar. Patient upgraded a couple days after to Regular, thins by SLP Diet Prior to this Study: Dysphagia 3 (soft);Thin liquids Temperature Spikes Noted: No Respiratory Status: Room air History of Recent Intubation: No Behavior/Cognition: Alert;Cooperative Oral Cavity Assessment: Within Functional Limits Oral Care Completed by SLP: No Oral Cavity - Dentition: Adequate natural dentition Vision: Functional for self-feeding Self-Feeding Abilities: Able to feed self Patient Positioning: Upright in bed Baseline Vocal Quality: Normal Volitional Cough: Congested Volitional Swallow: Unable to elicit    Oral/Motor/Sensory Function Overall Oral Motor/Sensory Function: Within functional limits   Ice Chips Ice chips: Not tested   Thin Liquid Thin Liquid: Impaired Presentation: Cup;Self Fed;Straw Pharyngeal  Phase Impairments: Suspected delayed Swallow;Throat Clearing -  Delayed;Multiple swallows    Nectar Thick Nectar Thick Liquid: Not tested   Honey Thick Honey Thick  Liquid: Not tested   Puree Puree: Not tested   Solid   GO   Solid: Impaired Pharyngeal Phase Impairments: Suspected delayed Swallow        Hector NevinJohn T. Preston, MA, CCC-SLP 06/04/17 12:58 PM

## 2017-06-04 NOTE — Consult Note (Addendum)
Neurology Consultation  Reason for Consult: Breakthrough seizure, recommendations on antiepileptics Referring Physician: Dr. Pollie Meyer  CC: Increasing frequency of seizures, recommendations for antibiotics  History is obtained from: Chart  HPI: Hector [name] is a 47 y.o. male who is right-handed, has a past medical history of seizure disorder, alcoholism and cirrhosis, status post traumatic brain injury subdural hematoma and subsequent symptomatic epilepsy, patient of Dr. Everlena Cooper at [facility] neurology, also has history of coarse tremors, admitted to the medicine service since 06/03/2017 for cough, dyspnea and increasing seizure activity. Per chart review, he had increased cough and dyspnea over the last few days prior to presentation.  His father also noted that he had multiple seizures over the last 5 days prior to presentation.  Reportedly compliant all his medications and no doses were missed. I have reviewed Dr. Moises Blood last note, in which she has started to taper him down off of his Topamax.  He continues to be on Vimpat and oxcarbazepine. Patient unable to provide any history.  Neurological consultation was obtained for optimizing antiepileptics in preparation for discharge. Patient does not voice any complaints although he is very severely aphasic and cannot reliably provide any history.  He can say yes or no, again not very reliably. He has a history of tremors for which she was on primidone.   EPP:IRJJOA to obtain due to altered mental status.   Past Medical History:  Diagnosis Date  . Cirrhosis (HCC)   . Depression   . DVT (deep venous thrombosis) (HCC)    UE, LE DVT during hospitalization  . GERD (gastroesophageal reflux disease)   . H/O ETOH abuse   . H/O renal calculi   . Seizure (HCC)   . TBI (traumatic brain injury) (HCC)    07/2013, fall from standing post seizure  . Tremor     Family History  Problem Relation Age of Onset  . Hypercholesterolemia Mother   . Hypertension  Mother   . Congestive Heart Failure Brother        Deceased age 53    Social History:   reports that he quit smoking about 6 weeks ago. His smoking use included cigarettes. He has a 5.00 pack-year smoking history. he has never used smokeless tobacco. He reports that he drinks alcohol. He reports that he does not use drugs.   Medications  Current Facility-Administered Medications:  .  0.9 %  sodium chloride infusion, 250 mL, Intravenous, PRN, Beaulah Dinning, MD .  acetaminophen (TYLENOL) tablet 650 mg, 650 mg, Oral, Q6H PRN **OR** acetaminophen (TYLENOL) suppository 650 mg, 650 mg, Rectal, Q6H PRN, Beaulah Dinning, MD .  amantadine (SYMMETREL) capsule 100 mg, 100 mg, Oral, BID, Beaulah Dinning, MD, 100 mg at 06/04/17 1103 .  bromocriptine (PARLODEL) tablet 5 mg, 5 mg, Oral, BID, Beaulah Dinning, MD, 5 mg at 06/04/17 1100 .  clonazePAM (KLONOPIN) tablet 1 mg, 1 mg, Oral, QHS, Beaulah Dinning, MD, 1 mg at 06/03/17 2227 .  enoxaparin (LOVENOX) injection 40 mg, 40 mg, Subcutaneous, Q24H, Beaulah Dinning, MD, 40 mg at 06/03/17 2227 .  escitalopram (LEXAPRO) tablet 5 mg, 5 mg, Oral, Daily, Beaulah Dinning, MD, 5 mg at 06/04/17 1102 .  feeding supplement (ENSURE ENLIVE) (ENSURE ENLIVE) liquid 237 mL, 237 mL, Oral, TID BM, Beaulah Dinning, MD, 237 mL at 06/04/17 1500 .  lacosamide (VIMPAT) tablet 100 mg, 100 mg, Oral, BID, Beaulah Dinning, MD, 100 mg at 06/04/17 1100 .  ondansetron (ZOFRAN) tablet 4  mg, 4 mg, Oral, Q6H PRN **OR** ondansetron (ZOFRAN) injection 4 mg, 4 mg, Intravenous, Q6H PRN, Beaulah DinningGambino, Christina M, MD .  Oxcarbazepine (TRILEPTAL) tablet 600 mg, 600 mg, Oral, BID, Beaulah DinningGambino, Christina M, MD, 600 mg at 06/04/17 1101 .  pantoprazole (PROTONIX) EC tablet 40 mg, 40 mg, Oral, Daily, Beaulah DinningGambino, Christina M, MD, 40 mg at 06/04/17 1101 .  piperacillin-tazobactam (ZOSYN) IVPB 3.375 g, 3.375 g, Intravenous, Q8H, Hammons, Kimberly B, RPH, Last Rate: 12.5  mL/hr at 06/04/17 1500, 3.375 g at 06/04/17 1500 .  polyethylene glycol (MIRALAX / GLYCOLAX) packet 17 g, 17 g, Oral, Daily PRN, Beaulah DinningGambino, Christina M, MD .  QUEtiapine (SEROQUEL) tablet 25 mg, 25 mg, Oral, BID, Beaulah DinningGambino, Christina M, MD, 25 mg at 06/04/17 1102 .  sodium chloride flush (NS) 0.9 % injection 3 mL, 3 mL, Intravenous, Q12H, Beaulah DinningGambino, Christina M, MD, 3 mL at 06/04/17 1000 .  sodium chloride flush (NS) 0.9 % injection 3 mL, 3 mL, Intravenous, PRN, Jonathon JordanGambino, Valentina Shaggyhristina M, MD .  vancomycin (VANCOCIN) IVPB 750 mg/150 ml premix, 750 mg, Intravenous, Q24H, Armandina StammerBatchelder, Nathan J, RPH  Exam: Current vital signs: BP 107/69 (BP Location: Right Arm)   Pulse (!) 58   Temp 97.7 F (36.5 C) (Oral)   Resp 18   Ht 5\' 8"  (1.727 m)   Wt 47.7 kg (105 lb 2.6 oz)   SpO2 98%   BMI 15.99 kg/m  Vital signs in last 24 hours: Temp:  [97.7 F (36.5 C)-97.9 F (36.6 C)] 97.7 F (36.5 C) (02/09 0644) Pulse Rate:  [58-75] 58 (02/09 0644) Resp:  [18-20] 18 (02/09 0644) BP: (107-144)/(69-79) 107/69 (02/09 0644) SpO2:  [98 %-100 %] 98 % (02/09 0644) Weight:  [47.7 kg (105 lb 2.6 oz)] 47.7 kg (105 lb 2.6 oz) (02/08 2145) General: Awake alert in no acute distress H ENT: Normocephalic atraumatic, dry mucous membranes, no thyromegaly, clear nares and throat Cardiovascular: S1-S2, RRR, no murmur rub gallop Abdomen soft nontender nondistended Extremities warm well perfused Lungs clear to auscultation Neurological exam Patient is awake, alert, oriented to self able to mouth his name with severe dysarthria. He is able to follow some simple commands inconsistently. Severe expressive aphasia. Unable to name, repeat. Speech is completely unintelligible Cranial nerves: Pupils equal round reactive light, extra ocular movement intact, visual fields full to threat, right-sided facial weakness with facial drooping of the lower face on the right. Motor exam: Increased tone in all 4 extremities with resting tremor and  intention tremor with large amplitude in all 4 extremities. Sensory exam: Intact light touch bilaterally Coordination: Dysmetric finger nose finger and very kinetic in both hands Gait exam was deferred at this time  Labs I have reviewed labs in epic and the results pertinent to this consultation are: CBC    Component Value Date/Time   WBC 9.9 06/04/2017 0313   RBC 3.61 (L) 06/04/2017 0313   HGB 11.1 (L) 06/04/2017 0313   HCT 34.2 (L) 06/04/2017 0313   PLT 186 06/04/2017 0313   MCV 94.7 06/04/2017 0313   MCH 30.7 06/04/2017 0313   MCHC 32.5 06/04/2017 0313   RDW 12.6 06/04/2017 0313   LYMPHSABS 1.3 06/03/2017 1348   MONOABS 0.6 06/03/2017 1348   EOSABS 0.1 06/03/2017 1348   BASOSABS 0.0 06/03/2017 1348   CMP     Component Value Date/Time   NA 141 06/04/2017 0313   K 3.8 06/04/2017 0313   CL 107 06/04/2017 0313   CO2 24 06/04/2017 0313   GLUCOSE 90  06/04/2017 0313   BUN 8 06/04/2017 0313   CREATININE 1.15 06/04/2017 0313   CALCIUM 9.0 06/04/2017 0313   PROT 6.5 06/04/2017 0313   ALBUMIN 3.1 (L) 06/04/2017 0313   AST 24 06/04/2017 0313   ALT 23 06/04/2017 0313   ALKPHOS 112 06/04/2017 0313   BILITOT 0.6 06/04/2017 0313   GFRNONAA >60 06/04/2017 0313   GFRAA >60 06/04/2017 0313   Imaging I have reviewed the images obtained:  CT-scan of the brain obtained at the time of admission on 06/03/2017 shows prior left craniotomy with stable larger evidence of a malacia in the left hemisphere that involves left temporal parietal lobes.  No acute findings.  Assessment:  47 year old man with past history of seizure disorder, alcoholism cirrhosis status post traumatic brain injury subdural hematoma and subsequent symptomatic epilepsy admitted for increasing seizure activity in the setting of cough dyspnea. Neurological consultation obtained for optimization of antibiotics. Ideally I would defer the optimization of his antiplatelet medications to his outpatient neurologist, which he  should make a follow-up appointment to see soon after discharge. I think it is increased seizure frequency is because of tapering down of his Topamax that he was on up until a month or 2 ago as well as the pneumonia for which she is being treated, and a KI on presentation, which might have lowered his seizure threshold. For now my recommendations are below.  Impression: Increasing seizure frequency in the setting of systemic illness History of TBI  Recommendations: --I would recommend increasing his Vimpat from 100 twice daily to 150 mg twice daily. --Continue with home dose of oxcarbazepine 600 twice daily. --If he continues to have breakthrough seizures, consider adding a third agent- consider zonisamide -which can be started at 100 mg daily and can be increased by 100 mg/day every 2 weeks for a maximum dose of 600 mg a day.  I would also like to defer this decision to his outpatient neurologist was more familiar with his case and care. --Continued management of his pneumonia per primary team as you are.  Avoid medications that would lower seizure threshold such as fluoroquinolones and metronidazole. --For seizure >56min, since he is allergic to Ativan, can use Versed or even Diazepam. --Mainten seizure precautions --No family member at bedside.  Please update the family upon your next interaction.  I will be available to answer any questions as needed. --Follow-up with Dr. Everlena Cooper in 7-10 days.  Neurology service will be available as needed.  Please call with questions.   -- Milon Dikes, MD [facility] Pager: [phone] If 7pm to 7am, please call on call as listed on AMION.

## 2017-06-04 NOTE — Discharge Summary (Signed)
Family Medicine [facility] Discharge Summary  Patient name: Hector NianRobert W [name] Medical record number: [id number] Date of birth: 1970-12-23 Age: 47 y.o. Gender: male Date of Admission: 06/03/2017  Date of Discharge: 06/06/2017 Admitting Physician: Latrelle DodrillBrittany J McIntyre, MD  Primary Care Provider: Medicine, [facility]: Neurology  Indication for Hospitalization: shortness of breath, increased seizures  Discharge Diagnoses/Problem List:  Healthcare acquired vs. aspiration pneumonia TBI/seizure disorder/aphasia/dementia H/o dysphagia H/o alcoholic cirrhosis GERD AKI, resolved  Disposition: Home  Discharge Condition: Improved  Discharge Exam:  General: thin male lying in bed, in NAD Cardiovascular: RRR, no murmurs/rubs/gallops Respiratory: CTAB, no wheezes/rales/rhonchi Abdomen: soft, tender to palpation diffusely, +BS Extremities: warm and well perfused, no edema, baseline tremor in all extremities  Brief Hospital Course:  Hector NianRobert W Pellum is a 47 y.o. male with PMH significant for traumatic brain injury, seizure disorder, aphasia, right-sided weakness, alcoholic cirrhosis, dysphasia who presented with cough, dyspnea, increased seizure activity for 5 days. In the ED, had slight tachypnea but with sats in high 90s on room air. WBC 9. AKI with Cr 1.29. Lactic acidosis resolved with IV hydration. ABG with possible respiratory alkalosis. Influenza negative. D dimer 0.86. CXR unremarkable, however CTA Chest was done due to elevated D dimer and showed LLL bronchopneumonia. Treated with Vanc/Zosyn for hospital-acquired pneumonia due to 4 day ED stay (1/29-1/30, 2/2-2/3). Antibiotics narrowed to Augmentin once clinically improved. Blood cultures negative x48 hours. Sputum culture obtained but not resulted by the time of discharge.  Neurology consulted this admission due to increased seizure history. Well followed outpatient. CT Head on admission without  acute abnormality. Of note, Topamax taper started outpatient a few weeks prior to admission. It was felt patient's increased seizures were due to acute infection as patient did not seize throughout entire admission on antibiotics. Neurology increased patient's Vimpat dose from 100mg  BID to 150 mg BID but the rest of his medication regimen remained the same. Patient did not receive topamax during hospitalization and received 3 days of 7 day taper based on admission med rec. Patient will follow up with Neurology in 1-2 weeks outpatient.  Issues for Follow Up:  1. Medication Changes: 1. Increased Vimpat from 100mg  BID to 150mg  BID 2. Continue Augmentin for 7 day total course, ends 06/10/17. 3. Discontinued Topamax. 2. Patient will follow up with Neurology 1-2 weeks outpatient.  Significant Procedures: None  Significant Labs and Imaging:  Recent Labs  Lab 06/04/17 0313 06/05/17 0836 06/06/17 0346  WBC 9.9 6.7 7.0  HGB 11.1* 11.5* 10.7*  HCT 34.2* 35.2* 33.0*  PLT 186 180 168   Recent Labs  Lab 06/03/17 1348 06/04/17 0313 06/05/17 0332 06/06/17 0346  NA 141 141 139 142  K 3.9 3.8 3.6 4.1  CL 101 107 104 105  CO2 25 24 25 27   GLUCOSE 113* 90 94 101*  BUN 11 8 7 9   CREATININE 1.29* 1.15 1.26* 1.14  CALCIUM 9.9 9.0 9.0 8.9  ALKPHOS 142* 112  --   --   AST 36 24  --   --   ALT 28 23  --   --   ALBUMIN 3.7 3.1*  --   --     2/8 CT Head FINDINGS: Brain: Mild atrophy. Ex vacuo dilatation of the LEFT lateral ventricle. Large area of encephalomalacia involving the LEFT hemisphere at the LEFT temporal and parietal lobes unchanged. No midline shift or mass effect. Remaining brain parenchyma normal appearance. No evidence of acute intracranial hemorrhage, mass lesion or acute  infarction. Vascular: No hyperdense vessels or significant atherosclerotic calcifications Skull: Post LEFT temporal craniotomy.  No acute osseous findings. Sinuses/Orbits: Opacified RIGHT maxillary sinus.  Remaining visualized paranasal sinuses and mastoid air cells clear. Other: N/A IMPRESSION: Prior LEFT craniotomy with stable large area of encephalomalacia in LEFT hemisphere involving the LEFT temporal and parietal lobes. No acute intracranial abnormalities.  2/8 CTA Chest FINDINGS: Cardiovascular: Pulmonary arterial opacification is good. There are no pulmonary emboli. Mild aortic atherosclerosis without aneurysm or dissection. No visible coronary artery calcification. Heart size is normal. No pericardial fluid. Mediastinum/Nodes: No mediastinal or hilar mass or lymphadenopathy. Lungs/Pleura: Background pattern of centrilobular emphysema. Areas of patchy alveolar filling in the dependent lungs, most pronounced in the left lower lobe with some volume loss. This is most consistent with pneumonia. No dense consolidation or lobar collapse. No pleural fluid. Upper Abdomen: Normal Musculoskeletal: Old compression fracture of T8. Review of the MIP images confirms the above findings. IMPRESSION: No pulmonary emboli. Background pattern of emphysema. Hazy bronchopneumonia in the dependent lungs, most pronounced in the left lower lobe. No dense consolidation or lobar collapse. Aortic Atherosclerosis (ICD10-I70.0)..  2/8 CT Abdomen/Pelvis FINDINGS: Lower chest: See results of chest CT. Hazy bronchopneumonia in both lungs, most advanced in the left lower lobe. No dense consolidation or collapse. Hepatobiliary: Slightly irregular surface of the liver that could go along with early cirrhosis. No calcified gallstones. Pancreas: Normal Spleen: Normal Adrenals/Urinary Tract: Adrenal glands are normal. Kidneys are normal. Bladder is normal. Stomach/Bowel: Very large amount of fecal matter throughout the entire colon. Small bowel pattern is normal. Today's study does not particularly suggest proctitis. Vascular/Lymphatic: Aortic atherosclerosis. No aneurysm. IVC is normal. No  retroperitoneal adenopathy. Reproductive: Normal Other: No free fluid or air. Musculoskeletal: Negative IMPRESSION: Very large amount of fecal matter throughout the colon. No other abdominal finding of acute relevance. Aortic atherosclerosis.  Results/Tests Pending at Time of Discharge: sputum culture  Discharge Medications:  Allergies as of 06/06/2017      Reactions   Ativan [lorazepam] Other (See Comments)   Pt has paradoxical effect with sedating medications.    Lactulose Hives      Medication List    STOP taking these medications   ciprofloxacin 500 MG tablet Commonly known as:  CIPRO   clindamycin 150 MG capsule Commonly known as:  CLEOCIN   metroNIDAZOLE 500 MG tablet Commonly known as:  FLAGYL   topiramate 25 MG tablet Commonly known as:  TOPAMAX     TAKE these medications   amantadine 100 MG capsule Commonly known as:  SYMMETREL TAKE 100 mg CAPSULE BY MOUTH TWICE A DAY   amoxicillin-clavulanate 875-125 MG tablet Commonly known as:  AUGMENTIN Take 1 tablet by mouth 2 (two) times daily for 4 days.   bromocriptine 2.5 MG tablet Commonly known as:  PARLODEL Take 5 mg by mouth 2 (two) times daily.   clonazePAM 1 MG tablet Commonly known as:  KLONOPIN Take 1 mg by mouth at bedtime.   escitalopram 5 MG tablet Commonly known as:  LEXAPRO Take 1 tablet (5 mg total) by mouth daily.   feeding supplement (ENSURE ENLIVE) Liqd Take 237 mLs 3 (three) times daily between meals by mouth.   Lacosamide 150 MG Tabs Take 1 tablet (150 mg total) by mouth 2 (two) times daily. What changed:    medication strength  how much to take   ondansetron 8 MG disintegrating tablet Commonly known as:  ZOFRAN ODT Take 1 tablet (8 mg total) every 8 (eight) hours as  needed by mouth for nausea or vomiting.   oxcarbazepine 600 MG tablet Commonly known as:  TRILEPTAL TAKE 1 TABLET BY MOUTH TWICE A DAY What changed:    how much to take  how to take this  when to take  this   pantoprazole 40 MG tablet Commonly known as:  PROTONIX Take 40 mg by mouth daily.   polyethylene glycol packet Commonly known as:  MIRALAX / GLYCOLAX Take 17 g by mouth daily as needed for moderate constipation.   QUEtiapine 25 MG tablet Commonly known as:  SEROQUEL Take 1 tablet (25 mg total) by mouth 2 (two) times daily.       Discharge Instructions: Please refer to Patient Instructions section of EMR for full details.  Patient was counseled important signs and symptoms that should prompt return to medical care, changes in medications, dietary instructions, activity restrictions, and follow up appointments.   Follow-Up Appointments: Follow-up Information    Medicine, [facility]. Schedule an appointment as soon as possible for a visit.   Specialty:  Pediatric Gastroenterology          Ellwood Dense, DO 06/06/2017, 12:23 PM PGY-1, [facility]

## 2017-06-05 LAB — BASIC METABOLIC PANEL
ANION GAP: 10 (ref 5–15)
BUN: 7 mg/dL (ref 6–20)
CO2: 25 mmol/L (ref 22–32)
Calcium: 9 mg/dL (ref 8.9–10.3)
Chloride: 104 mmol/L (ref 101–111)
Creatinine, Ser: 1.26 mg/dL — ABNORMAL HIGH (ref 0.61–1.24)
GFR calc non Af Amer: >60 mL/min (ref >60)
Glucose, Bld: 94 mg/dL (ref 65–99)
POTASSIUM: 3.6 mmol/L (ref 3.5–5.1)
SODIUM: 139 mmol/L (ref 135–145)

## 2017-06-05 LAB — CBC
HEMATOCRIT: 35.2 % — AB (ref 39.0–52.0)
Hemoglobin: 11.5 g/dL — ABNORMAL LOW (ref 13.0–17.0)
MCH: 31.3 pg (ref 26.0–34.0)
MCHC: 32.7 g/dL (ref 30.0–36.0)
MCV: 95.7 fL (ref 78.0–100.0)
Platelets: 180 10*3/uL (ref 150–400)
RBC: 3.68 MIL/uL — ABNORMAL LOW (ref 4.22–5.81)
RDW: 12.6 % (ref 11.5–15.5)
WBC: 6.7 10*3/uL (ref 4.0–10.5)

## 2017-06-05 MED ORDER — AMOXICILLIN-POT CLAVULANATE 875-125 MG PO TABS
1.0000 | ORAL_TABLET | Freq: Two times a day (BID) | ORAL | Status: DC
  Administered 2017-06-05 – 2017-06-06 (×3): 1 via ORAL
  Filled 2017-06-05 (×3): qty 1

## 2017-06-05 NOTE — Progress Notes (Signed)
Family Medicine Teaching Service Daily Progress Note Intern Pager: [phone]  Patient name: Hector [name] Medical record number: [id number] Date of birth: 05/10/1970 Age: 47 y.o. Gender: male  Primary Care Provider: Medicine, [facility]: None Code Status: Full  Pt Overview and Major Events to Date:  2/8 - admitted for shortness of breath, increased seizures  Assessment and Plan: Hector [name] is a 47 y.o. male presenting with cough, dyspnea, increased seizure activity x5d. PMH is significant for traumatic brain injury, seizure disorder, aphasia, right-sided weakness, alcoholic cirrhosis, dysphasia.  Healthcare-acquired vs. Aspiration Pneumonia L lower bronchopneumonia evidenced on CT Chest. VSS overnight and continues to be afebrile, stable on room air. No leukocytosis and lactic acidosis now resolved. Blood cultures NGx<24h. Obtaining sputum cultures. Healthcare acquired due to 4 day ED stay a few weeks ago (1/29-1/30 and 2/2-2/3) vs. Aspiration due to dysphagia and increased seizures. Continues on Vanc/Zosyn. On exam, patient with resting tremor in all extremities which appears to be his baseline. Lungs CTAB with comfortable work of breathing on room air. Likely infection precipitated worsened seizures. - Vanc/Zosyn (2/8 -2/10), transition to Augmentin today  - Supplemental O2 as needed - Blood cultures pending - sputum culture  - Daily BMP and CBC  Traumatic brain injury/seizure disorder/aphasia/dementia TBI, aphasia, R sided weakness due to fall after seizure in 2015. Typical seizures are full body tonic-clonic. Follows with neurology outpatient.  No seizures noted overnight. -neurology consulted, appreciate recs  -increase Vimpat from 100 mg BID to 150 mg BID  -For seizure >88min, since he is allergic to Ativan, can use Versed or even Diazepam - Continue home medications:  bromocriptine 5 mg twice daily, Klonopin 1 mg nightly, Lexapro 5 mg daily,  lacosamide 100 mg twice daily, Trileptal 600 mg twice daily, Seroquel 25 mg twice daily - Seizure precautions - PT/OT  History of dysphasia Secondary to fall after seizure in 2015. Apparently was on dysphasia 3 diet in the past, obtaining SLP consult. Interviews limited due to dysphagia and TBI. SLP consulted who recommended dysphagia 3 diet.  - Continue dysphagia 3 diet  History of alcoholic cirrhosis Previous alcoholic. No need for CIWA at this time due to no alcohol use in years per father. ALP is elevated to 142, AST and ALT within normal limits. - monitor  Constipation Evidenced via CT abdomen on admission with last BM 2/7 per father. On exam, tender to palpation diffusely. - continue Miralax  Mild AKI: Creatinine 1.26. Possibly prerenal in etiology vs CKD (CKD on past medical history list) - Encourage PO - monitor with BMP  History of protein calorie malnutrition Normal albumin of 3.7 on admission.  He does appear thin on exam and drinks mostly Ensure as per father's report - Continue Ensure  GERD Stable on home regimen - Continue home Protonix 40 mg daily  FEN/GI: Dysphasia 3 diet, saline lock IV Prophylaxis: Lovenox  Disposition: continue inpatient management of pneumonia  Subjective:  Patient unable to speak legibly but can give thumb up or down for yes or no questions. No chest pain or difficulty breathing. Nothing is bothering him.   Objective: Temp:  [98.4 F (36.9 C)] 98.4 F (36.9 C) (02/10 0537) Pulse Rate:  [66-67] 66 (02/10 0537) Resp:  [16-17] 16 (02/10 0537) BP: (111-126)/(62-64) 111/62 (02/10 0537) SpO2:  [99 %] 99 % (02/10 0537) Physical Exam: General: thin male lying in bed, in NAD Cardiovascular: RRR, no murmurs/rubs/gallops Respiratory: CTAB, no wheezes/rales/rhonchi Abdomen: soft, tender to palpation diffusely, +BS Extremities:  warm and well perfused, no edema, baseline tremor  Laboratory: Recent Labs  Lab 06/03/17 1348  06/04/17 0313  WBC 9.0 9.9  HGB 12.7* 11.1*  HCT 38.7* 34.2*  PLT 234 186   Recent Labs  Lab 06/03/17 1348 06/04/17 0313 06/05/17 0332  NA 141 141 139  K 3.9 3.8 3.6  CL 101 107 104  CO2 25 24 25   BUN 11 8 7   CREATININE 1.29* 1.15 1.26*  CALCIUM 9.9 9.0 9.0  PROT 7.7 6.5  --   BILITOT 0.4 0.6  --   ALKPHOS 142* 112  --   ALT 28 23  --   AST 36 24  --   GLUCOSE 113* 90 94   Imaging/Diagnostic Tests: Dg Chest 2 View  Result Date: 06/03/2017 CLINICAL DATA:  Cough and fever EXAM: CHEST  2 VIEW COMPARISON:  May 28, 2017 FINDINGS: There is no edema or consolidation. Heart size and pulmonary vascularity are normal. No adenopathy. No bone lesions. IMPRESSION: No edema or consolidation. Electronically Signed   By: Bretta BangWilliam  Woodruff III M.D.   On: 06/03/2017 15:01   Ct Head Wo Contrast  Result Date: 06/03/2017 CLINICAL DATA:  Altered level of consciousness EXAM: CT HEAD WITHOUT CONTRAST TECHNIQUE: Contiguous axial images were obtained from the base of the skull through the vertex without intravenous contrast. Sagittal and coronal MPR images reconstructed from axial data set. COMPARISON:  05/24/2017 FINDINGS: Brain: Mild atrophy. Ex vacuo dilatation of the LEFT lateral ventricle. Large area of encephalomalacia involving the LEFT hemisphere at the LEFT temporal and parietal lobes unchanged. No midline shift or mass effect. Remaining brain parenchyma normal appearance. No evidence of acute intracranial hemorrhage, mass lesion or acute infarction. Vascular: No hyperdense vessels or significant atherosclerotic calcifications Skull: Post LEFT temporal craniotomy.  No acute osseous findings. Sinuses/Orbits: Opacified RIGHT maxillary sinus. Remaining visualized paranasal sinuses and mastoid air cells clear. Other: N/A IMPRESSION: Prior LEFT craniotomy with stable large area of encephalomalacia in LEFT hemisphere involving the LEFT temporal and parietal lobes. No acute intracranial abnormalities.  Electronically Signed   By: Ulyses SouthwardMark  Boles M.D.   On: 06/03/2017 17:33   Ct Angio Chest Pe W/cm &/or Wo Cm  Result Date: 06/03/2017 CLINICAL DATA:  Acute presentation with shortness of breath. Chest pain and cough. EXAM: CT ANGIOGRAPHY CHEST WITH CONTRAST TECHNIQUE: Multidetector CT imaging of the chest was performed using the standard protocol during bolus administration of intravenous contrast. Multiplanar CT image reconstructions and MIPs were obtained to evaluate the vascular anatomy. CONTRAST:  100mL ISOVUE-370 IOPAMIDOL (ISOVUE-370) INJECTION 76% COMPARISON:  Radiography same day. FINDINGS: Cardiovascular: Pulmonary arterial opacification is good. There are no pulmonary emboli. Mild aortic atherosclerosis without aneurysm or dissection. No visible coronary artery calcification. Heart size is normal. No pericardial fluid. Mediastinum/Nodes: No mediastinal or hilar mass or lymphadenopathy. Lungs/Pleura: Background pattern of centrilobular emphysema. Areas of patchy alveolar filling in the dependent lungs, most pronounced in the left lower lobe with some volume loss. This is most consistent with pneumonia. No dense consolidation or lobar collapse. No pleural fluid. Upper Abdomen: Normal Musculoskeletal: Old compression fracture of T8. Review of the MIP images confirms the above findings. IMPRESSION: No pulmonary emboli. Background pattern of emphysema. Hazy bronchopneumonia in the dependent lungs, most pronounced in the left lower lobe. No dense consolidation or lobar collapse. Aortic Atherosclerosis (ICD10-I70.0). Electronically Signed   By: Paulina FusiMark  Shogry M.D.   On: 06/03/2017 19:04   Ct Abdomen Pelvis W Contrast  Result Date: 06/03/2017 CLINICAL DATA:  Acute  presentation with shortness of breath and cough. History of cirrhosis. Recent proctitis. EXAM: CT ABDOMEN AND PELVIS WITH CONTRAST TECHNIQUE: Multidetector CT imaging of the abdomen and pelvis was performed using the standard protocol following bolus  administration of intravenous contrast. CONTRAST:  ISOVUE-370 IOPAMIDOL (ISOVUE-370) INJECTION 76% COMPARISON:  05/28/2017 FINDINGS: Lower chest: See results of chest CT. Hazy bronchopneumonia in both lungs, most advanced in the left lower lobe. No dense consolidation or collapse. Hepatobiliary: Slightly irregular surface of the liver that could go along with early cirrhosis. No calcified gallstones. Pancreas: Normal Spleen: Normal Adrenals/Urinary Tract: Adrenal glands are normal. Kidneys are normal. Bladder is normal. Stomach/Bowel: Very large amount of fecal matter throughout the entire colon. Small bowel pattern is normal. Today's study does not particularly suggest proctitis. Vascular/Lymphatic: Aortic atherosclerosis. No aneurysm. IVC is normal. No retroperitoneal adenopathy. Reproductive: Normal Other: No free fluid or air. Musculoskeletal: Negative IMPRESSION: Very large amount of fecal matter throughout the colon. No other abdominal finding of acute relevance. Aortic atherosclerosis. Possible early changes of cirrhosis. Electronically Signed   By: Paulina Fusi M.D.   On: 06/03/2017 19:08   Dg Swallowing Func-speech Pathology  Result Date: 06/04/2017 Objective Swallowing Evaluation: Type of Study: MBS-Modified Barium Swallow Study  Patient Details Name: ARMARI FUSSELL MRN: [id number] Date of Birth: 01-Apr-1971 Today's Date: 06/04/2017 Time: SLP Start Time (ACUTE ONLY): 1340 -SLP Stop Time (ACUTE ONLY): 1402 SLP Time Calculation (min) (ACUTE ONLY): 22 min Past Medical History: Past Medical History: Diagnosis Date . Cirrhosis (HCC)  . Depression  . DVT (deep venous thrombosis) (HCC)   UE, LE DVT during hospitalization . GERD (gastroesophageal reflux disease)  . H/O ETOH abuse  . H/O renal calculi  . Seizure (HCC)  . TBI (traumatic brain injury) (HCC)   07/2013, fall from standing post seizure . Tremor  Past Surgical History: Past Surgical History: Procedure Laterality Date . BRAIN SURGERY   . GASTROSTOMY  TUBE PLACEMENT   . RADIOLOGY WITH ANESTHESIA N/A 04/20/2017  Procedure: MRI WITH ANESTHESIA OF THE BRAIN;  Surgeon: Radiologist, Medication, MD;  Location: [facility] OR;  Service: Radiology;  Laterality: N/A; . REMOVAL OF GASTROSTOMY TUBE   . TRACHEOSTOMY    feinstein HPI: Patient is a 47 y.o. male who presented to hospital with cough, dyspnea and increased seizure activity. PMH: TBI, seizure disorder, aphasia, right-sided weakness, alcoholic cirrhosis, dysphagia.  Subjective: sitting up in chair in radiology suite, pleasant and cooperative Assessment / Plan / Recommendation CHL IP CLINICAL IMPRESSIONS 06/04/2017 Clinical Impression Patient presents with mild-moderate oral and moderate pharyngeal dysphagia characterized by swallow initiation delays to vallecular sinus with all tested consistencies, vallecular and sinus residuals post initial swallow, and mastication and oral transit delays with hard solids and pill. Patient did not exhibit any penetration or aspiration, however if he does not complete fairly consistent 1-2 extra swallows after solids and liquids, a build up of pharyngeal residuals could result in a delayed penetration or aspiration.  SLP Visit Diagnosis Dysphagia, oropharyngeal phase (R13.12) Attention and concentration deficit following -- Frontal lobe and executive function deficit following -- Impact on safety and function Mild aspiration risk   CHL IP TREATMENT RECOMMENDATION 06/04/2017 Treatment Recommendations Therapy as outlined in treatment plan below   Prognosis 06/04/2017 Prognosis for Safe Diet Advancement Good Barriers to Reach Goals Cognitive deficits Barriers/Prognosis Comment -- CHL IP DIET RECOMMENDATION 06/04/2017 SLP Diet Recommendations Regular solids;Thin liquid Liquid Administration via Cup;Straw Medication Administration Crushed with puree Compensations Minimize environmental distractions;Slow rate;Small sips/bites;Multiple dry swallows after each  bite/sip Postural Changes Seated upright at  90 degrees   CHL IP OTHER RECOMMENDATIONS 06/04/2017 Recommended Consults -- Oral Care Recommendations Oral care BID Other Recommendations --   CHL IP FOLLOW UP RECOMMENDATIONS 06/04/2017 Follow up Recommendations None   CHL IP FREQUENCY AND DURATION 06/04/2017 Speech Therapy Frequency (ACUTE ONLY) min 1 x/week Treatment Duration 1 week      CHL IP ORAL PHASE 06/04/2017 Oral Phase Impaired Oral - Pudding Teaspoon -- Oral - Pudding Cup -- Oral - Honey Teaspoon -- Oral - Honey Cup -- Oral - Nectar Teaspoon -- Oral - Nectar Cup -- Oral - Nectar Straw -- Oral - Thin Teaspoon -- Oral - Thin Cup -- Oral - Thin Straw -- Oral - Puree -- Oral - Mech Soft -- Oral - Regular Impaired mastication;Reduced posterior propulsion Oral - Multi-Consistency -- Oral - Pill Reduced posterior propulsion;Other (Comment) Oral Phase - Comment --  CHL IP PHARYNGEAL PHASE 06/04/2017 Pharyngeal Phase Impaired Pharyngeal- Pudding Teaspoon -- Pharyngeal -- Pharyngeal- Pudding Cup -- Pharyngeal -- Pharyngeal- Honey Teaspoon -- Pharyngeal -- Pharyngeal- Honey Cup -- Pharyngeal -- Pharyngeal- Nectar Teaspoon -- Pharyngeal -- Pharyngeal- Nectar Cup -- Pharyngeal -- Pharyngeal- Nectar Straw -- Pharyngeal -- Pharyngeal- Thin Teaspoon -- Pharyngeal -- Pharyngeal- Thin Cup Delayed swallow initiation-vallecula;Pharyngeal residue - valleculae;Pharyngeal residue - pyriform;Compensatory strategies attempted (with notebox) Pharyngeal -- Pharyngeal- Thin Straw Delayed swallow initiation-vallecula Pharyngeal -- Pharyngeal- Puree Pharyngeal residue - valleculae;Pharyngeal residue - pyriform;Compensatory strategies attempted (with notebox);Delayed swallow initiation-vallecula Pharyngeal -- Pharyngeal- Mechanical Soft -- Pharyngeal -- Pharyngeal- Regular Pharyngeal residue - valleculae;Delayed swallow initiation-vallecula Pharyngeal -- Pharyngeal- Multi-consistency -- Pharyngeal -- Pharyngeal- Pill -- Pharyngeal -- Pharyngeal Comment --  CHL IP CERVICAL ESOPHAGEAL PHASE  06/04/2017 Cervical Esophageal Phase WFL Pudding Teaspoon -- Pudding Cup -- Honey Teaspoon -- Honey Cup -- Nectar Teaspoon -- Nectar Cup -- Nectar Straw -- Thin Teaspoon -- Thin Cup -- Thin Straw -- Puree -- Mechanical Soft -- Regular -- Multi-consistency -- Pill -- Cervical Esophageal Comment -- [name], MA, CCC-SLP 06/04/17 4:43 PM              Arvilla Market, DO 06/05/2017, 6:57 AM PGY-3, [facility] FPTS Intern pager: [phone], text pages welcome

## 2017-06-06 DIAGNOSIS — R1310 Dysphagia, unspecified: Secondary | ICD-10-CM

## 2017-06-06 DIAGNOSIS — N179 Acute kidney failure, unspecified: Secondary | ICD-10-CM

## 2017-06-06 LAB — CBC
HEMATOCRIT: 33 % — AB (ref 39.0–52.0)
Hemoglobin: 10.7 g/dL — ABNORMAL LOW (ref 13.0–17.0)
MCH: 30.8 pg (ref 26.0–34.0)
MCHC: 32.4 g/dL (ref 30.0–36.0)
MCV: 95.1 fL (ref 78.0–100.0)
PLATELETS: 168 10*3/uL (ref 150–400)
RBC: 3.47 MIL/uL — AB (ref 4.22–5.81)
RDW: 12.6 % (ref 11.5–15.5)
WBC: 7 10*3/uL (ref 4.0–10.5)

## 2017-06-06 LAB — BASIC METABOLIC PANEL
ANION GAP: 10 (ref 5–15)
BUN: 9 mg/dL (ref 6–20)
CHLORIDE: 105 mmol/L (ref 101–111)
CO2: 27 mmol/L (ref 22–32)
Calcium: 8.9 mg/dL (ref 8.9–10.3)
Creatinine, Ser: 1.14 mg/dL (ref 0.61–1.24)
Glucose, Bld: 101 mg/dL — ABNORMAL HIGH (ref 65–99)
POTASSIUM: 4.1 mmol/L (ref 3.5–5.1)
SODIUM: 142 mmol/L (ref 135–145)

## 2017-06-06 MED ORDER — LACOSAMIDE 150 MG PO TABS: 150.0000 mg | ORAL_TABLET | Freq: Two times a day (BID) | ORAL | 0 refills | Status: DC

## 2017-06-06 MED ORDER — AMOXICILLIN-POT CLAVULANATE 875-125 MG PO TABS: 1.0000 | ORAL_TABLET | Freq: Two times a day (BID) | ORAL | 0 refills | Status: AC

## 2017-06-06 NOTE — Progress Notes (Signed)
Family Medicine Teaching Service Daily Progress Note Intern Pager: [phone]  Patient name: Hector [name] Medical record number: [id number] Date of birth: 05-07-1970 Age: 47 y.o. Gender: male  Primary Care Provider: Medicine, [facility]: None Code Status: Full  Pt Overview and Major Events to Date:  2/8 - admitted for shortness of breath, increased seizures 2/10 - switch to PO antibiotics  Assessment and Plan: Hector [name] is a 47 y.o. male presenting with cough, dyspnea, increased seizure activity x5d. PMH is significant for traumatic brain injury, seizure disorder, aphasia, right-sided weakness, alcoholic cirrhosis, dysphasia.  Healthcare-acquired vs. Aspiration Pneumonia L lower bronchopneumonia evidenced on CT Chest. VSS and continues to be afebrile throughout admission, stable on room air. No leukocytosis and lactic acidosis now resolved. Blood cultures NGx48h. Sputum cultures not yet obtained, likely will not need. Healthcare acquired due to 4 day ED stay a few weeks ago (1/29-1/30 and 2/2-2/3) vs. Aspiration due to dysphagia and increased seizures. S/p Vanc/Cefepime, continues on Augmentin. On exam, patient with improved resting tremor in all extremities, has tremor at baseline. Lungs CTAB with comfortable work of breathing on room air. Likely infection precipitated worsened seizures as has not seized since admission. - s/p Vanc/Cefepime (2/8 -2/10),  - continue Augmentin (2/10 - ) - Supplemental O2 as needed - sputum culture  - Daily BMP and CBC  Traumatic brain injury/seizure disorder/aphasia/dementia TBI, aphasia, R sided weakness due to fall after seizure in 2015. Typical seizures are full body tonic-clonic. Follows with neurology outpatient.  Appears to be weaning Topamax in the outpatient setting. No seizures noted since admission, likely stable for discharge today with outpatient neuro follow up.  - neurology consulted, appreciate recs   - increased Vimpat from 100 mg BID to 150 mg BID  - For seizure >31min, since he is allergic to Ativan, can use Versed or even Diazepam - Continue home medications: bromocriptine 5 mg twice daily, Klonopin 1 mg nightly, Lexapro 5 mg daily, lacosamide 100 mg twice daily, Trileptal 600 mg twice daily, Seroquel 25 mg twice daily - Seizure precautions - PT/OT - no PT follow up - will need outpatient neuro f/u on discharge  History of dysphasia Secondary to fall after seizure in 2015. Apparently was on dysphasia 3 diet in the past, obtaining SLP consult. Interviews limited due to dysphagia and TBI. SLP consulted who recommended dysphagia 3 diet.  - Continue dysphagia 3 diet  History of alcoholic cirrhosis Previous alcoholic. No need for CIWA at this time due to no alcohol use in years per father. ALP is elevated to 142, AST and ALT within normal limits. - monitor  Constipation Evidenced via CT abdomen on admission with last BM 2/7 per father. On exam, tender to palpation diffusely. - continue Miralax  Mild AKI: Creatinine 1.26>1.14 2/11, baseline appears to be ~1.15. Possibly prerenal in etiology vs CKD (CKD on past medical history list). - Encourage PO - monitor with BMP  History of protein calorie malnutrition Normal albumin of 3.7 on admission.  He does appear thin on exam and drinks mostly Ensure as per father's report - Continue Ensure  GERD Stable on home regimen - Continue home Protonix 40 mg daily  FEN/GI: Dysphasia 3 diet, saline lock IV Prophylaxis: Lovenox  Disposition: continue inpatient management of pneumonia  Subjective:  Interview difficult due to patient's dysphagia. Thumbs down for pain or questions.   Objective: Temp:  [98.4 F (36.9 C)-98.6 F (37 C)] 98.4 F (36.9 C) (02/11 0300) Pulse Rate:  [  61-67] 61 (02/11 0300) Resp:  [15-17] 15 (02/11 0300) BP: (111-132)/(67-69) 111/68 (02/11 0300) SpO2:  [98 %-100 %] 99 % (02/11 0300) Physical  Exam: General: thin male lying in bed, in NAD Cardiovascular: RRR, no murmurs/rubs/gallops Respiratory: CTAB, no wheezes/rales/rhonchi Abdomen: soft, tender to palpation diffusely, +BS Extremities: warm and well perfused, no edema, baseline tremor in all extremities  Laboratory: Recent Labs  Lab 06/04/17 0313 06/05/17 0836 06/06/17 0346  WBC 9.9 6.7 7.0  HGB 11.1* 11.5* 10.7*  HCT 34.2* 35.2* 33.0*  PLT 186 180 168   Recent Labs  Lab 06/03/17 1348 06/04/17 0313 06/05/17 0332 06/06/17 0346  NA 141 141 139 142  K 3.9 3.8 3.6 4.1  CL 101 107 104 105  CO2 25 24 25 27   BUN 11 8 7 9   CREATININE 1.29* 1.15 1.26* 1.14  CALCIUM 9.9 9.0 9.0 8.9  PROT 7.7 6.5  --   --   BILITOT 0.4 0.6  --   --   ALKPHOS 142* 112  --   --   ALT 28 23  --   --   AST 36 24  --   --   GLUCOSE 113* 90 94 101*   Imaging/Diagnostic Tests: Dg Swallowing Func-speech Pathology  Result Date: 06/04/2017 Objective Swallowing Evaluation: Type of Study: MBS-Modified Barium Swallow Study  Patient Details Name: Hector [name] MRN: [id number] Date of Birth: 1970-07-08 Today's Date: 06/04/2017 Time: SLP Start Time (ACUTE ONLY): 1340 -SLP Stop Time (ACUTE ONLY): 1402 SLP Time Calculation (min) (ACUTE ONLY): 22 min Past Medical History: Past Medical History: Diagnosis Date . Cirrhosis (HCC)  . Depression  . DVT (deep venous thrombosis) (HCC)   UE, LE DVT during hospitalization . GERD (gastroesophageal reflux disease)  . H/O ETOH abuse  . H/O renal calculi  . Seizure (HCC)  . TBI (traumatic brain injury) (HCC)   07/2013, fall from standing post seizure . Tremor  Past Surgical History: Past Surgical History: Procedure Laterality Date . BRAIN SURGERY   . GASTROSTOMY TUBE PLACEMENT   . RADIOLOGY WITH ANESTHESIA N/A 04/20/2017  Procedure: MRI WITH ANESTHESIA OF THE BRAIN;  Surgeon: Radiologist, Medication, MD;  Location: [facility] OR;  Service: Radiology;  Laterality: N/A; . REMOVAL OF GASTROSTOMY TUBE   . TRACHEOSTOMY    Hector [name] HPI:  Patient is a 47 y.o. male who presented to hospital with cough, dyspnea and increased seizure activity. PMH: TBI, seizure disorder, aphasia, right-sided weakness, alcoholic cirrhosis, dysphagia.  Subjective: sitting up in chair in radiology suite, pleasant and cooperative Assessment / Plan / Recommendation CHL IP CLINICAL IMPRESSIONS 06/04/2017 Clinical Impression Patient presents with mild-moderate oral and moderate pharyngeal dysphagia characterized by swallow initiation delays to vallecular sinus with all tested consistencies, vallecular and sinus residuals post initial swallow, and mastication and oral transit delays with hard solids and pill. Patient did not exhibit any penetration or aspiration, however if he does not complete fairly consistent 1-2 extra swallows after solids and liquids, a build up of pharyngeal residuals could result in a delayed penetration or aspiration.  SLP Visit Diagnosis Dysphagia, oropharyngeal phase (R13.12) Attention and concentration deficit following -- Frontal lobe and executive function deficit following -- Impact on safety and function Mild aspiration risk   CHL IP TREATMENT RECOMMENDATION 06/04/2017 Treatment Recommendations Therapy as outlined in treatment plan below   Prognosis 06/04/2017 Prognosis for Safe Diet Advancement Good Barriers to Reach Goals Cognitive deficits Barriers/Prognosis Comment -- CHL IP DIET RECOMMENDATION 06/04/2017 SLP Diet Recommendations Regular solids;Thin liquid Liquid Administration via  Cup;Straw Medication Administration Crushed with puree Compensations Minimize environmental distractions;Slow rate;Small sips/bites;Multiple dry swallows after each bite/sip Postural Changes Seated upright at 90 degrees   CHL IP OTHER RECOMMENDATIONS 06/04/2017 Recommended Consults -- Oral Care Recommendations Oral care BID Other Recommendations --   CHL IP FOLLOW UP RECOMMENDATIONS 06/04/2017 Follow up Recommendations None   CHL IP FREQUENCY AND DURATION 06/04/2017 Speech  Therapy Frequency (ACUTE ONLY) min 1 x/week Treatment Duration 1 week      CHL IP ORAL PHASE 06/04/2017 Oral Phase Impaired Oral - Pudding Teaspoon -- Oral - Pudding Cup -- Oral - Honey Teaspoon -- Oral - Honey Cup -- Oral - Nectar Teaspoon -- Oral - Nectar Cup -- Oral - Nectar Straw -- Oral - Thin Teaspoon -- Oral - Thin Cup -- Oral - Thin Straw -- Oral - Puree -- Oral - Mech Soft -- Oral - Regular Impaired mastication;Reduced posterior propulsion Oral - Multi-Consistency -- Oral - Pill Reduced posterior propulsion;Other (Comment) Oral Phase - Comment --  CHL IP PHARYNGEAL PHASE 06/04/2017 Pharyngeal Phase Impaired Pharyngeal- Pudding Teaspoon -- Pharyngeal -- Pharyngeal- Pudding Cup -- Pharyngeal -- Pharyngeal- Honey Teaspoon -- Pharyngeal -- Pharyngeal- Honey Cup -- Pharyngeal -- Pharyngeal- Nectar Teaspoon -- Pharyngeal -- Pharyngeal- Nectar Cup -- Pharyngeal -- Pharyngeal- Nectar Straw -- Pharyngeal -- Pharyngeal- Thin Teaspoon -- Pharyngeal -- Pharyngeal- Thin Cup Delayed swallow initiation-vallecula;Pharyngeal residue - valleculae;Pharyngeal residue - pyriform;Compensatory strategies attempted (with notebox) Pharyngeal -- Pharyngeal- Thin Straw Delayed swallow initiation-vallecula Pharyngeal -- Pharyngeal- Puree Pharyngeal residue - valleculae;Pharyngeal residue - pyriform;Compensatory strategies attempted (with notebox);Delayed swallow initiation-vallecula Pharyngeal -- Pharyngeal- Mechanical Soft -- Pharyngeal -- Pharyngeal- Regular Pharyngeal residue - valleculae;Delayed swallow initiation-vallecula Pharyngeal -- Pharyngeal- Multi-consistency -- Pharyngeal -- Pharyngeal- Pill -- Pharyngeal -- Pharyngeal Comment --  CHL IP CERVICAL ESOPHAGEAL PHASE 06/04/2017 Cervical Esophageal Phase WFL Pudding Teaspoon -- Pudding Cup -- Honey Teaspoon -- Honey Cup -- Nectar Teaspoon -- Nectar Cup -- Nectar Straw -- Thin Teaspoon -- Thin Cup -- Thin Straw -- Puree -- Mechanical Soft -- Regular -- Multi-consistency -- Pill --  Cervical Esophageal Comment -- [name], MA, CCC-SLP 06/04/17 4:43 PM              Ellwood Denseumball, Cire Clute, DO 06/06/2017, 7:29 AM PGY-3, [facility] FPTS Intern pager: [phone], text pages welcome

## 2017-06-06 NOTE — Progress Notes (Signed)
Physical Therapy Treatment Patient Details Name: Hector NianRobert W [name] MRN: [id number] DOB: [birth year] Today's Date: 06/06/2017    History of Present Illness Hector NianRobert W [name] is a 47 y.o. male presenting with cough, dyspnea, increased seizure activity. PMH is significant for traumatic brain injury, seizure disorder, aphasia, right-sided weakness, alcoholic cirrhosis, dysphasia    PT Comments    Pt progressing towards goals. Continues to exhibit ataxic gait, however, anticipate that pt is close to baseline given PMH. No family present during session, so unable to follow up on home information. Per chart, pt lives with parent. Will continue to benefit from acute PT to maximize functional mobility independence and safety.    Follow Up Recommendations  No PT follow up;Supervision/Assistance - 24 hour     Equipment Recommendations  None recommended by PT    Recommendations for Other Services       Precautions / Restrictions Precautions Precautions: Fall Restrictions Weight Bearing Restrictions: No    Mobility  Bed Mobility Overal bed mobility: Modified Independent                Transfers Overall transfer level: Needs assistance Equipment used: Rolling walker (2 wheeled);None Transfers: Sit to/from Stand Sit to Stand: Min guard         General transfer comment: Min guard for safety. Pt less impulsive during transfer today. Pt reaching for RW to stand. Verbal and tactile cues for safe hand placement, however, pt continued to pull up from RW.   Ambulation/Gait Ambulation/Gait assistance: Min assist Ambulation Distance (Feet): 200 Feet Assistive device: Rolling walker (2 wheeled) Gait Pattern/deviations: Step-through pattern;Ataxic Gait velocity: Decreased  Gait velocity interpretation: Below normal speed for age/gender General Gait Details: Ataxic gait with RW. Cues for appropriate step width as pt with narrow BOS. Required standing rests X3. Min A for steadying  throughout. When pt asked if this was normal walking, pt nodded yes.    Stairs            Wheelchair Mobility    Modified Rankin (Stroke Patients Only)       Balance Overall balance assessment: Needs assistance Sitting-balance support: No upper extremity supported Sitting balance-Leahy Scale: Good     Standing balance support: Bilateral upper extremity supported;During functional activity Standing balance-Leahy Scale: Poor Standing balance comment: Reliant on RW for stability.                             Cognition Arousal/Alertness: Awake/alert Behavior During Therapy: Impulsive Overall Cognitive Status: History of cognitive impairments - at baseline                                 General Comments: TBI at baseline       Exercises General Exercises - Lower Extremity Heel Raises: AROM;Both;10 reps(Required demonstration ) Mini-Sqauts: AROM;Both;10 reps(Required RW and demonstration for technique )    General Comments General comments (skin integrity, edema, etc.): No family present during session, anticipate pt is close to baseline.       Pertinent Vitals/Pain Pain Assessment: No/denies pain    Home Living                      Prior Function            PT Goals (current goals can now be found in the care plan section) Acute Rehab PT Goals Patient Stated Goal: none  stated PT Goal Formulation: With patient Time For Goal Achievement: 06/18/17 Potential to Achieve Goals: Good Progress towards PT goals: Progressing toward goals    Frequency    Min 3X/week      PT Plan Current plan remains appropriate    Co-evaluation              AM-PAC PT "6 Clicks" Daily Activity  Outcome Measure  Difficulty turning over in bed (including adjusting bedclothes, sheets and blankets)?: None Difficulty moving from lying on back to sitting on the side of the bed? : None Difficulty sitting down on and standing up from a  chair with arms (e.g., wheelchair, bedside commode, etc,.)?: Unable Help needed moving to and from a bed to chair (including a wheelchair)?: A Little Help needed walking in hospital room?: A Little Help needed climbing 3-5 steps with a railing? : A Little 6 Click Score: 18    End of Session Equipment Utilized During Treatment: Gait belt Activity Tolerance: Patient tolerated treatment well Patient left: in bed;with call bell/phone within reach;with bed alarm set Nurse Communication: Mobility status PT Visit Diagnosis: Unsteadiness on feet (R26.81);Ataxic gait (R26.0)     Time: 4098-1191 PT Time Calculation (min) (ACUTE ONLY): 18 min  Charges:  $Gait Training: 8-22 mins                    G Codes:       Gladys Damme, PT, DPT  Acute Rehabilitation Services  Pager: [phone]    Lehman Prom 06/06/2017, 11:24 AM

## 2017-06-06 NOTE — Progress Notes (Signed)
Pt discharged to home. PIV removed. AVS reviewed with pt and pts father. Pt left unit with belongings in hand.  Pt to be transported home by father.

## 2017-06-06 NOTE — Progress Notes (Signed)
Occupational Therapy Treatment Patient Details Name: Hector NianRobert W [name] MRN: [id number] DOB: [birth year] Today's Date: 06/06/2017    History of present illness Hector NianRobert W [name] is a 47 y.o. male presenting with cough, dyspnea, increased seizure activity. PMH is significant for traumatic brain injury, seizure disorder, aphasia, right-sided weakness, alcoholic cirrhosis, dysphasia   OT comments  Pt declining working on bathing and dressing with OT, indicated he would do that when he went home. Pt needing min assist for standing balance and safety. Performed toileting and washed hands with min assist. Pt able to doff front opening gown in sitting independently in sitting. Pt choosing to return to bed at end of session.   Follow Up Recommendations  No OT follow up    Equipment Recommendations  None recommended by OT    Recommendations for Other Services      Precautions / Restrictions Precautions Precautions: Fall Restrictions Weight Bearing Restrictions: No       Mobility Bed Mobility Overal bed mobility: Modified Independent             General bed mobility comments: HOB up  Transfers Overall transfer level: Needs assistance Equipment used: Rolling walker (2 wheeled) Transfers: Sit to/from Stand Sit to Stand: Min guard         General transfer comment: pulls up on walker despite multimodal cues, min guard for safety    Balance Overall balance assessment: Needs assistance Sitting-balance support: No upper extremity supported Sitting balance-Leahy Scale: Good     Standing balance support: Bilateral upper extremity supported;During functional activity Standing balance-Leahy Scale: Poor Standing balance comment: Reliant on RW for stability.                            ADL either performed or assessed with clinical judgement   ADL Overall ADL's : Needs assistance/impaired     Grooming: Wash/dry hands;Standing;Minimal assistance Grooming Details  (indicate cue type and reason): min assist for standing balance with use of B UEs         Upper Body Dressing : Set up;Supervision/safety;Sitting Upper Body Dressing Details (indicate cue type and reason): doffed front opening gown     Toilet Transfer: Minimal assistance;Ambulation;Regular Toilet   Toileting- Clothing Manipulation and Hygiene: Minimal assistance;Sit to/from stand       Functional mobility during ADLs: Minimal assistance;Rolling walker General ADL Comments: pt likely with baseline impairment in balance, ataxia     Vision       Perception     Praxis      Cognition Arousal/Alertness: Awake/alert Behavior During Therapy: Impulsive Overall Cognitive Status: History of cognitive impairments - at baseline                                 General Comments: TBI at baseline         Exercises Exercises: General Lower Extremity General Exercises - Lower Extremity Heel Raises: AROM;Both;10 reps(Required demonstration ) Mini-Sqauts: AROM;Both;10 reps(Required RW and demonstration for technique )   Shoulder Instructions       General Comments No family present during session, anticipate pt is close to baseline.     Pertinent Vitals/ Pain       Pain Assessment: No/denies pain  Home Living  Prior Functioning/Environment              Frequency  Min 2X/week        Progress Toward Goals  OT Goals(current goals can now be found in the care plan section)  Progress towards OT goals: Progressing toward goals  Acute Rehab OT Goals Patient Stated Goal: to go home today OT Goal Formulation: With patient Time For Goal Achievement: 06/18/17 Potential to Achieve Goals: Good  Plan Discharge plan remains appropriate    Co-evaluation                 AM-PAC PT "6 Clicks" Daily Activity     Outcome Measure   Help from another person eating meals?: None Help from another  person taking care of personal grooming?: A Little Help from another person toileting, which includes using toliet, bedpan, or urinal?: A Little Help from another person bathing (including washing, rinsing, drying)?: A Little Help from another person to put on and taking off regular upper body clothing?: None Help from another person to put on and taking off regular lower body clothing?: A Little 6 Click Score: 20    End of Session Equipment Utilized During Treatment: Rolling walker;Gait belt  OT Visit Diagnosis: Unsteadiness on feet (R26.81);Other abnormalities of gait and mobility (R26.89);Muscle weakness (generalized) (M62.81)   Activity Tolerance Patient tolerated treatment well   Patient Left in bed;with call bell/phone within reach;with bed alarm set   Nurse Communication          Time: 1610-9604 OT Time Calculation (min): 19 min  Charges: OT General Charges $OT Visit: 1 Visit OT Treatments $Self Care/Home Management : 8-22 mins  06/06/2017 Martie Round, OTR/L Pager: [phone] [place] Planas, [place] Bailiff 06/06/2017, 12:22 PM

## 2017-06-06 NOTE — Discharge Instructions (Signed)
You were admitted for increased seizure activity in the setting of pneumonia. You were treated with IV antibiotics for pneumonia and switched to oral prior to discharge. You did not have any seizures while admitted. Neurology was consulted who increased to dose of your Vimpat to 150mg  BID. No other medications were changed. You will be discharged with 4 more days of oral antibiotics for your pneumonia.  Follow up with your primary doctor within a week.

## 2017-06-08 LAB — CULTURE, BLOOD (ROUTINE X 2)
Culture: NO GROWTH
Culture: NO GROWTH
SPECIAL REQUESTS: ADEQUATE
SPECIAL REQUESTS: ADEQUATE

## 2017-06-09 ENCOUNTER — Emergency Department (HOSPITAL_COMMUNITY)
Admission: EM | Admit: 2017-06-09 | Discharge: 2017-06-09 | Disposition: A | Discharge status: Home / Self Care | Payer: Medicaid Other | Attending: Emergency Medicine | Admitting: Emergency Medicine

## 2017-06-09 ENCOUNTER — Encounter (HOSPITAL_COMMUNITY): Payer: Self-pay | Admitting: Nurse Practitioner

## 2017-06-09 DIAGNOSIS — R112 Nausea with vomiting, unspecified: Secondary | ICD-10-CM | POA: Diagnosis present

## 2017-06-09 DIAGNOSIS — Z79899 Other long term (current) drug therapy: Secondary | ICD-10-CM | POA: Insufficient documentation

## 2017-06-09 DIAGNOSIS — R630 Anorexia: Secondary | ICD-10-CM | POA: Diagnosis not present

## 2017-06-09 DIAGNOSIS — Z87891 Personal history of nicotine dependence: Secondary | ICD-10-CM | POA: Insufficient documentation

## 2017-06-09 DIAGNOSIS — E860 Dehydration: Secondary | ICD-10-CM

## 2017-06-09 DIAGNOSIS — Z86718 Personal history of other venous thrombosis and embolism: Secondary | ICD-10-CM | POA: Insufficient documentation

## 2017-06-09 DIAGNOSIS — G40909 Epilepsy, unspecified, not intractable, without status epilepticus: Secondary | ICD-10-CM | POA: Insufficient documentation

## 2017-06-09 DIAGNOSIS — K7460 Unspecified cirrhosis of liver: Secondary | ICD-10-CM | POA: Insufficient documentation

## 2017-06-09 DIAGNOSIS — R5383 Other fatigue: Secondary | ICD-10-CM | POA: Insufficient documentation

## 2017-06-09 DIAGNOSIS — R4701 Aphasia: Secondary | ICD-10-CM | POA: Insufficient documentation

## 2017-06-09 DIAGNOSIS — Z87820 Personal history of traumatic brain injury: Secondary | ICD-10-CM | POA: Insufficient documentation

## 2017-06-09 LAB — URINALYSIS, ROUTINE W REFLEX MICROSCOPIC
Bilirubin Urine: NEGATIVE
Glucose, UA: NEGATIVE mg/dL
Hgb urine dipstick: NEGATIVE
Ketones, ur: NEGATIVE mg/dL
LEUKOCYTES UA: NEGATIVE
NITRITE: NEGATIVE
PH: 9 — AB (ref 5.0–8.0)
Protein, ur: NEGATIVE mg/dL
SPECIFIC GRAVITY, URINE: 1.011 (ref 1.005–1.030)

## 2017-06-09 LAB — COMPREHENSIVE METABOLIC PANEL
ALBUMIN: 3.6 g/dL (ref 3.5–5.0)
ALK PHOS: 139 U/L — AB (ref 38–126)
ALT: 53 U/L (ref 17–63)
ANION GAP: 12 (ref 5–15)
AST: 54 U/L — ABNORMAL HIGH (ref 15–41)
BILIRUBIN TOTAL: 0.3 mg/dL (ref 0.3–1.2)
BUN: 13 mg/dL (ref 6–20)
CALCIUM: 9.4 mg/dL (ref 8.9–10.3)
CO2: 26 mmol/L (ref 22–32)
Chloride: 105 mmol/L (ref 101–111)
Creatinine, Ser: 1.08 mg/dL (ref 0.61–1.24)
GFR calc non Af Amer: >60 mL/min (ref >60)
Glucose, Bld: 106 mg/dL — ABNORMAL HIGH (ref 65–99)
POTASSIUM: 3.9 mmol/L (ref 3.5–5.1)
Sodium: 143 mmol/L (ref 135–145)
TOTAL PROTEIN: 7 g/dL (ref 6.5–8.1)

## 2017-06-09 LAB — LIPASE, BLOOD: Lipase: 30 U/L (ref 11–51)

## 2017-06-09 LAB — CBC
HEMATOCRIT: 33.2 % — AB (ref 39.0–52.0)
HEMOGLOBIN: 10.8 g/dL — AB (ref 13.0–17.0)
MCH: 31.2 pg (ref 26.0–34.0)
MCHC: 32.5 g/dL (ref 30.0–36.0)
MCV: 96 fL (ref 78.0–100.0)
Platelets: 189 10*3/uL (ref 150–400)
RBC: 3.46 MIL/uL — AB (ref 4.22–5.81)
RDW: 12.6 % (ref 11.5–15.5)
WBC: 8.5 10*3/uL (ref 4.0–10.5)

## 2017-06-09 LAB — AMMONIA: AMMONIA: 39 umol/L — AB (ref 9–35)

## 2017-06-09 MED ORDER — AMANTADINE HCL 100 MG PO CAPS
100.0000 mg | ORAL_CAPSULE | Freq: Two times a day (BID) | ORAL | Status: DC
  Administered 2017-06-09: 100 mg via ORAL
  Filled 2017-06-09: qty 1

## 2017-06-09 MED ORDER — BROMOCRIPTINE MESYLATE 2.5 MG PO TABS
5.0000 mg | ORAL_TABLET | Freq: Two times a day (BID) | ORAL | Status: DC
  Administered 2017-06-09: 5 mg via ORAL
  Filled 2017-06-09 (×2): qty 2

## 2017-06-09 MED ORDER — QUETIAPINE FUMARATE 25 MG PO TABS
25.0000 mg | ORAL_TABLET | Freq: Two times a day (BID) | ORAL | Status: DC
Start: 2017-06-09 — End: 2017-06-10
  Administered 2017-06-09: 25 mg via ORAL
  Filled 2017-06-09: qty 1

## 2017-06-09 MED ORDER — OXCARBAZEPINE 300 MG PO TABS
600.0000 mg | ORAL_TABLET | Freq: Two times a day (BID) | ORAL | Status: DC
  Administered 2017-06-09: 600 mg via ORAL
  Filled 2017-06-09: qty 2

## 2017-06-09 MED ORDER — DEXTROSE 5 % AND 0.45 % NACL IV BOLUS
1000.0000 mL | Freq: Once | INTRAVENOUS | Status: AC
  Administered 2017-06-09: 1000 mL via INTRAVENOUS

## 2017-06-09 NOTE — ED Provider Notes (Addendum)
[facility]-EMERGENCY DEPT Provider Note   CSN: [id number] Arrival date & time: 06/09/17  1604     History   Chief Complaint Chief Complaint  Patient presents with  . Fatigue  . Low Appetite    HPI Hector [name] is a 47 y.o. male.  HPI  47 year old with history of liver cirrhosis, DVT, TBI with a aphasia, seizure disorder comes in with chief complaint of nausea, vomiting, weakness.  Patient was just discharged from the hospital after being treated for a pneumonia.  Patient allegedly had some seizure-like episodes, and his Vimpat was increased during that admission.  According to father, starting this morning patient has been refusing p.o. intake.  Patient has had nausea with emesis x1, and since then he has refused his medications.  Patient has not been having any increased fevers or chills, but he does appreciate some weakness.  Past Medical History:  Diagnosis Date  . Cirrhosis (HCC)   . Depression   . DVT (deep venous thrombosis) (HCC)    UE, LE DVT during hospitalization  . GERD (gastroesophageal reflux disease)   . H/O ETOH abuse   . H/O renal calculi   . Seizure (HCC)   . TBI (traumatic brain injury) (HCC)    07/2013, fall from standing post seizure  . Tremor     Patient Active Problem List   Diagnosis Date Noted  . Agitation   . Aspiration pneumonia due to gastric secretions (HCC)   . Hypoxia   . Coarse tremors 04/18/2017  . ARF (acute renal failure) (HCC) 04/18/2017  . Hypokalemia   . Hypomagnesemia   . Physical deconditioning   . FTT (failure to thrive) in adult 02/25/2017  . Oropharyngeal dysphagia 01/01/2017  . Orthostatic hypotension 01/01/2017  . Failure to thrive in adult 12/31/2016  . Vomiting   . Seizures (HCC) 06/01/2015  . Alcoholic cirrhosis of liver without ascites (HCC)   . Dehydration   . Encephalopathy acute 02/13/2015  . Altered mental status   . Ataxia 02/12/2015  . Localization-related symptomatic epilepsy  and epileptic syndromes with complex partial seizures, not intractable, without status epilepticus (HCC) 08/16/2014  . Tobacco abuse 08/16/2014  . Acute kidney injury superimposed on CKD (HCC) 06/25/2014  . Dysphagia 06/25/2014  . Centrilobular emphysema (HCC) 01/27/2014  . Tracheostomy in place [facility](HCC) 01/27/2014  . Encounter for family conference with patient present 01/23/2014  . Dvt femoral (deep venous thrombosis) (HCC) 01/05/2014  . Protein-calorie malnutrition, severe (HCC) 01/05/2014  . Alcohol abuse 01/05/2014  . Acute respiratory failure with hypoxia (HCC) 12/20/2013  . Delirium due to general medical condition 12/13/2013  . HCAP (healthcare-associated pneumonia) 12/13/2013  . SIRS (systemic inflammatory response syndrome) (HCC) 12/13/2013  . TBI (traumatic brain injury) (HCC) 12/12/2013    Past Surgical History:  Procedure Laterality Date  . BRAIN SURGERY    . GASTROSTOMY TUBE PLACEMENT    . RADIOLOGY WITH ANESTHESIA N/A 04/20/2017   Procedure: MRI WITH ANESTHESIA OF THE BRAIN;  Surgeon: Radiologist, Medication, MD;  Location: [facility] OR;  Service: Radiology;  Laterality: N/A;  . REMOVAL OF GASTROSTOMY TUBE    . TRACHEOSTOMY     feinstein       Home Medications    Prior to Admission medications   Medication Sig Start Date End Date Taking? Authorizing Provider  amantadine (SYMMETREL) 100 MG capsule TAKE 100 mg CAPSULE BY MOUTH TWICE A DAY 01/06/17  Yes [provider]  amoxicillin-clavulanate (AUGMENTIN) 875-125 MG tablet Take 1 tablet by mouth  2 (two) times daily for 4 days. 06/06/17 06/10/17 Yes Ellwood Dense, DO  bromocriptine (PARLODEL) 2.5 MG tablet Take 5 mg by mouth 2 (two) times daily.   Yes [provider]  clonazePAM (KLONOPIN) 1 MG tablet Take 1 mg by mouth at bedtime. 12/24/16  Yes [provider]  escitalopram (LEXAPRO) 5 MG tablet Take 1 tablet (5 mg total) by mouth daily. 04/30/17  Yes Maxie Barb, MD  feeding supplement,  ENSURE ENLIVE, (ENSURE ENLIVE) LIQD Take 237 mLs 3 (three) times daily between meals by mouth. 02/28/17  Yes Vassie Loll, MD  lacosamide 150 MG TABS Take 1 tablet (150 mg total) by mouth 2 (two) times daily. 06/06/17 07/06/17 Yes Rumball, Jill Side, DO  ondansetron (ZOFRAN ODT) 8 MG disintegrating tablet Take 1 tablet (8 mg total) every 8 (eight) hours as needed by mouth for nausea or vomiting. 02/28/17  Yes Vassie Loll, MD  oxcarbazepine (TRILEPTAL) 600 MG tablet TAKE 1 TABLET BY MOUTH TWICE A DAY Patient taking differently: TAKE 600 mg TABLET BY MOUTH TWICE A DAY 05/03/17  Yes Jaffe, Adam R, DO  pantoprazole (PROTONIX) 40 MG tablet Take 40 mg by mouth daily.    Yes [provider]  primidone (MYSOLINE) 50 MG tablet Take 50 mg by mouth at bedtime.   Yes [provider]  QUEtiapine (SEROQUEL) 25 MG tablet Take 1 tablet (25 mg total) by mouth 2 (two) times daily. Patient taking differently: Take 25 mg by mouth daily.  05/25/17  Yes Laveda Abbe, NP  polyethylene glycol [facility] / Ethelene Hal) packet Take 17 g by mouth daily as needed for moderate constipation.    [provider]    Family History Family History  Problem Relation Age of Onset  . Hypercholesterolemia Mother   . Hypertension Mother   . Congestive Heart Failure Brother        Deceased age 79    Social History Social History   Tobacco Use  . Smoking status: Former Smoker    Packs/day: 0.25    Years: 20.00    Pack years: 5.00    Types: Cigarettes    Last attempt to quit: 04/17/2017    Years since quitting: 0.1  . Smokeless tobacco: Never Used  Substance Use Topics  . Alcohol use: Yes    Alcohol/week: 0.0 oz  . Drug use: No     Allergies   Ativan [lorazepam] and Lactulose   Review of Systems Review of Systems  Unable to perform ROS: Other (Traumatic brain injury related aphasia)     Physical Exam Updated Vital Signs BP 106/66   Pulse 76   Temp (!) 97.3 F (36.3 C) (Oral)    Resp (!) 26   SpO2 100%   Physical Exam  HENT:  Head: Atraumatic.  Cardiovascular: Normal rate.  Pulmonary/Chest: Effort normal.  Abdominal: Soft. There is no tenderness.  Skin: Skin is warm.  Nursing note and vitals reviewed.    ED Treatments / Results  Labs (all labs ordered are listed, but only abnormal results are displayed) Labs Reviewed  COMPREHENSIVE METABOLIC PANEL - Abnormal; Notable for the following components:      Result Value   Glucose, Bld 106 (*)    AST 54 (*)    Alkaline Phosphatase 139 (*)    All other components within normal limits  CBC - Abnormal; Notable for the following components:   RBC 3.46 (*)    Hemoglobin 10.8 (*)    HCT 33.2 (*)  All other components within normal limits  URINALYSIS, ROUTINE W REFLEX MICROSCOPIC - Abnormal; Notable for the following components:   pH 9.0 (*)    All other components within normal limits  AMMONIA - Abnormal; Notable for the following components:   Ammonia 39 (*)    All other components within normal limits  LIPASE, BLOOD    EKG  EKG Interpretation None       Radiology No results found.  Procedures Procedures (including critical care time)  Medications Ordered in ED Medications  amantadine (SYMMETREL) capsule 100 mg (not administered)  bromocriptine (PARLODEL) tablet 5 mg (not administered)  Oxcarbazepine (TRILEPTAL) tablet 600 mg (not administered)  QUEtiapine (SEROQUEL) tablet 25 mg (not administered)  dextrose 5 % and 0.45% NaCl 5-0.45 % bolus 1,000 mL (0 mLs Intravenous Stopped 06/09/17 2040)     Initial Impression / Assessment and Plan / ED Course  I have reviewed the triage vital signs and the nursing notes.  Pertinent labs & imaging results that were available during my care of the patient were reviewed by me and considered in my medical decision making (see chart for details).  Clinical Course as of Jun 10 2143  Thu Jun 09, 2017  1828 Bedside nurse informed me that patient had a  seizure-like episode according to the father. This could be because he has not been able to keep any medications down/taken any medications today, and because he is slightly dehydrated.  We will monitor closely.  Seizure precautions already started by the nurse.  [AN]  2141 Pt reassessed. Pt's VSS and WNL. Pt's cap refill < 3 seconds. Pt has been hydrated in the ER and now passed po challenge. We will discharge with antiemetic. Strict ER return precautions have been discussed and pt will return if he is unable to tolerate fluids and symptoms are getting worse.   [AN]    Clinical Course User Index [AN] Derwood Kaplan, MD    47 year old comes in with chief complaint of generalized weakness. Patient has history of TBI and unable to provide any meaningful history.  Patient was recently admitted to the hospital for H CAP.  He also suffered to some seizures, and his Vimpat was increased during that admission.  Patient was sent to the ED by his father, who I spoke to.  According to the father patient had an episode of nausea and vomiting at home and since then he has not had any p.o. Intake.  His son also has been acting more fussy and having generalized weakness, therefore he decided to bring him into the hospital.  On my exam there is no acute focal tenderness or any deformity. Basic labs ordered along with hydration.  Final Clinical Impressions(s) / ED Diagnoses   Final diagnoses:  Seizure disorder [facility])  Dehydration    ED Discharge Orders    None       Derwood Kaplan, MD 06/09/17 Harrel Lemon, MD 06/09/17 2145

## 2017-06-09 NOTE — Discharge Instructions (Signed)
Please return to the ER if your symptoms worsen; you have increased pain, fevers, chills, inability to keep any medications down, confusion. Otherwise see the outpatient doctor as requested.  

## 2017-06-09 NOTE — ED Triage Notes (Signed)
Pt comes from home. Came via [facility]. Pt father called 911 due to son complaining of nausea/vomiting and generalized weakness. Pt can ambulate with assistance and walker. Pt can be verbal however pt is limited to what he can say due to past stroke. [facility]. Pt father is point of contact.

## 2017-06-09 NOTE — ED Notes (Signed)
Bed: WA06 Expected date:  Expected time:  Means of arrival:  Comments: EMS-abdominal pain-triage 

## 2017-07-01 ENCOUNTER — Emergency Department (HOSPITAL_COMMUNITY): Payer: Medicaid Other

## 2017-07-01 ENCOUNTER — Emergency Department (HOSPITAL_COMMUNITY)
Admission: EM | Admit: 2017-07-01 | Discharge: 2017-07-02 | Disposition: A | Discharge status: Home / Self Care | Payer: Medicaid Other | Attending: Emergency Medicine | Admitting: Emergency Medicine

## 2017-07-01 DIAGNOSIS — K5900 Constipation, unspecified: Secondary | ICD-10-CM | POA: Diagnosis not present

## 2017-07-01 DIAGNOSIS — Z87891 Personal history of nicotine dependence: Secondary | ICD-10-CM | POA: Diagnosis not present

## 2017-07-01 DIAGNOSIS — R630 Anorexia: Secondary | ICD-10-CM | POA: Insufficient documentation

## 2017-07-01 DIAGNOSIS — Z86718 Personal history of other venous thrombosis and embolism: Secondary | ICD-10-CM | POA: Diagnosis not present

## 2017-07-01 DIAGNOSIS — Z79899 Other long term (current) drug therapy: Secondary | ICD-10-CM | POA: Insufficient documentation

## 2017-07-01 DIAGNOSIS — R4182 Altered mental status, unspecified: Secondary | ICD-10-CM | POA: Diagnosis present

## 2017-07-01 DIAGNOSIS — Z87820 Personal history of traumatic brain injury: Secondary | ICD-10-CM | POA: Insufficient documentation

## 2017-07-01 LAB — CBC WITH DIFFERENTIAL/PLATELET
Basophils Absolute: 0 10*3/uL (ref 0.0–0.1)
Basophils Relative: 1 %
EOS PCT: 1 %
Eosinophils Absolute: 0.1 10*3/uL (ref 0.0–0.7)
HEMATOCRIT: 37.8 % — AB (ref 39.0–52.0)
Hemoglobin: 12.4 g/dL — ABNORMAL LOW (ref 13.0–17.0)
LYMPHS ABS: 1.3 10*3/uL (ref 0.7–4.0)
LYMPHS PCT: 18 %
MCH: 31.3 pg (ref 26.0–34.0)
MCHC: 32.8 g/dL (ref 30.0–36.0)
MCV: 95.5 fL (ref 78.0–100.0)
Monocytes Absolute: 0.6 10*3/uL (ref 0.1–1.0)
Monocytes Relative: 8 %
NEUTROS ABS: 5.1 10*3/uL (ref 1.7–7.7)
Neutrophils Relative %: 72 %
PLATELETS: 184 10*3/uL (ref 150–400)
RBC: 3.96 MIL/uL — ABNORMAL LOW (ref 4.22–5.81)
RDW: 12.9 % (ref 11.5–15.5)
WBC: 7.1 10*3/uL (ref 4.0–10.5)

## 2017-07-01 LAB — COMPREHENSIVE METABOLIC PANEL
ALK PHOS: 146 U/L — AB (ref 38–126)
ALT: 22 U/L (ref 17–63)
AST: 29 U/L (ref 15–41)
Albumin: 4.1 g/dL (ref 3.5–5.0)
Anion gap: 10 (ref 5–15)
BILIRUBIN TOTAL: 0.5 mg/dL (ref 0.3–1.2)
BUN: 13 mg/dL (ref 6–20)
CALCIUM: 10 mg/dL (ref 8.9–10.3)
CO2: 29 mmol/L (ref 22–32)
CREATININE: 1.25 mg/dL — AB (ref 0.61–1.24)
Chloride: 103 mmol/L (ref 101–111)
Glucose, Bld: 101 mg/dL — ABNORMAL HIGH (ref 65–99)
Potassium: 4.1 mmol/L (ref 3.5–5.1)
Sodium: 142 mmol/L (ref 135–145)
TOTAL PROTEIN: 7.9 g/dL (ref 6.5–8.1)

## 2017-07-01 LAB — I-STAT TROPONIN, ED: TROPONIN I, POC: 0.01 ng/mL (ref 0.00–0.08)

## 2017-07-01 MED ORDER — DOXYCYCLINE HYCLATE 100 MG PO TABS
100.0000 mg | ORAL_TABLET | Freq: Once | ORAL | Status: AC
  Administered 2017-07-01: 100 mg via ORAL
  Filled 2017-07-01: qty 1

## 2017-07-01 MED ORDER — SODIUM CHLORIDE 0.9 % IV BOLUS (SEPSIS)
1000.0000 mL | Freq: Once | INTRAVENOUS | Status: AC
  Administered 2017-07-01: 1000 mL via INTRAVENOUS

## 2017-07-01 MED ORDER — DOXYCYCLINE HYCLATE 100 MG PO CAPS: 100.0000 mg | ORAL_CAPSULE | Freq: Two times a day (BID) | ORAL | 0 refills | Status: DC

## 2017-07-01 NOTE — ED Triage Notes (Signed)
Pt to ED via [facility], where father called 911 for patient not eating, taking his medicines, and being teary eyed "depressed." Pt father didn't want him to get dehydrated. Pt seen her 3 weeks ago for the same complaints. Pt has hx of TBI and is non verbal. Pt denies pain from this nurse and to EMS  118/78 BP 90 pulse 114 CBG

## 2017-07-01 NOTE — Discharge Instructions (Signed)
Use milk of magnesia to help with the constipation drink plenty of fluids.  Follow up with your md next week

## 2017-07-01 NOTE — ED Provider Notes (Signed)
[facility]-EMERGENCY DEPT Provider Note   CSN: [id number] Arrival date & time: 07/01/17  1516     History   Chief Complaint Chief Complaint  Patient presents with  . Failure To Thrive    HPI Hector [name] is a 47 y.o. male.  Patient with a history of head injury.  Patient was sent over by his father because he has not been eating and drinking as much.   The history is provided by a relative. No language interpreter was used.  Illness  This is a recurrent problem. The current episode started more than 1 week ago. The problem occurs constantly. The problem has not changed since onset.Pertinent negatives include no chest pain. Nothing aggravates the symptoms.    Past Medical History:  Diagnosis Date  . Cirrhosis (HCC)   . Depression   . DVT (deep venous thrombosis) (HCC)    UE, LE DVT during hospitalization  . GERD (gastroesophageal reflux disease)   . H/O ETOH abuse   . H/O renal calculi   . Seizure (HCC)   . TBI (traumatic brain injury) (HCC)    07/2013, fall from standing post seizure  . Tremor     Patient Active Problem List   Diagnosis Date Noted  . Agitation   . Aspiration pneumonia due to gastric secretions (HCC)   . Hypoxia   . Coarse tremors 04/18/2017  . ARF (acute renal failure) (HCC) 04/18/2017  . Hypokalemia   . Hypomagnesemia   . Physical deconditioning   . FTT (failure to thrive) in adult 02/25/2017  . Oropharyngeal dysphagia 01/01/2017  . Orthostatic hypotension 01/01/2017  . Failure to thrive in adult 12/31/2016  . Vomiting   . Seizures (HCC) 06/01/2015  . Alcoholic cirrhosis of liver without ascites (HCC)   . Dehydration   . Encephalopathy acute 02/13/2015  . Altered mental status   . Ataxia 02/12/2015  . Localization-related symptomatic epilepsy and epileptic syndromes with complex partial seizures, not intractable, without status epilepticus (HCC) 08/16/2014  . Tobacco abuse 08/16/2014  . Acute kidney injury  superimposed on CKD (HCC) 06/25/2014  . Dysphagia 06/25/2014  . Centrilobular emphysema (HCC) 01/27/2014  . Tracheostomy in place [facility](HCC) 01/27/2014  . Encounter for family conference with patient present 01/23/2014  . Dvt femoral (deep venous thrombosis) (HCC) 01/05/2014  . Protein-calorie malnutrition, severe (HCC) 01/05/2014  . Alcohol abuse 01/05/2014  . Acute respiratory failure with hypoxia (HCC) 12/20/2013  . Delirium due to general medical condition 12/13/2013  . HCAP (healthcare-associated pneumonia) 12/13/2013  . SIRS (systemic inflammatory response syndrome) (HCC) 12/13/2013  . TBI (traumatic brain injury) (HCC) 12/12/2013    Past Surgical History:  Procedure Laterality Date  . BRAIN SURGERY    . GASTROSTOMY TUBE PLACEMENT    . RADIOLOGY WITH ANESTHESIA N/A 04/20/2017   Procedure: MRI WITH ANESTHESIA OF THE BRAIN;  Surgeon: Radiologist, Medication, MD;  Location: [facility] OR;  Service: Radiology;  Laterality: N/A;  . REMOVAL OF GASTROSTOMY TUBE    . TRACHEOSTOMY     feinstein       Home Medications    Prior to Admission medications   Medication Sig Start Date End Date Taking? Authorizing Provider  amantadine (SYMMETREL) 100 MG capsule TAKE 100 mg CAPSULE BY MOUTH TWICE A DAY 01/06/17  Yes [provider]  bromocriptine (PARLODEL) 2.5 MG tablet Take 5 mg by mouth 2 (two) times daily.   Yes [provider]  clonazePAM (KLONOPIN) 1 MG tablet Take 1 mg by mouth at bedtime.  12/24/16  Yes [provider]  escitalopram (LEXAPRO) 10 MG tablet Take 10 mg by mouth daily. 06/20/17  Yes [provider]  feeding supplement, ENSURE ENLIVE, (ENSURE ENLIVE) LIQD Take 237 mLs 3 (three) times daily between meals by mouth. 02/28/17  Yes Vassie Loll, MD  lacosamide 150 MG TABS Take 1 tablet (150 mg total) by mouth 2 (two) times daily. 06/06/17 07/06/17 Yes Rumball, Jill Side, DO  ondansetron (ZOFRAN ODT) 8 MG disintegrating tablet Take 1 tablet (8 mg total) every 8  (eight) hours as needed by mouth for nausea or vomiting. 02/28/17  Yes Vassie Loll, MD  oxcarbazepine (TRILEPTAL) 600 MG tablet TAKE 1 TABLET BY MOUTH TWICE A DAY Patient taking differently: TAKE 600 mg TABLET BY MOUTH TWICE A DAY 05/03/17  Yes Jaffe, Adam R, DO  pantoprazole (PROTONIX) 40 MG tablet Take 40 mg by mouth daily.    Yes [provider]  polyethylene glycol (MIRALAX / GLYCOLAX) packet Take 17 g by mouth daily as needed for moderate constipation.   Yes [provider]  primidone (MYSOLINE) 50 MG tablet Take 50 mg by mouth at bedtime.   Yes [provider]  QUEtiapine (SEROQUEL) 100 MG tablet 100 MG AT 10 AM, 100 MG AT 2 PM, 200 MG AT BEDTIME 06/15/17  Yes [provider]  doxycycline (VIBRAMYCIN) 100 MG capsule Take 1 capsule (100 mg total) by mouth 2 (two) times daily. One po bid x 7 days 07/01/17   Bethann Berkshire, MD    Family History Family History  Problem Relation Age of Onset  . Hypercholesterolemia Mother   . Hypertension Mother   . Congestive Heart Failure Brother        Deceased age 75    Social History Social History   Tobacco Use  . Smoking status: Former Smoker    Packs/day: 0.25    Years: 20.00    Pack years: 5.00    Types: Cigarettes    Last attempt to quit: 04/17/2017    Years since quitting: 0.2  . Smokeless tobacco: Never Used  Substance Use Topics  . Alcohol use: Yes    Alcohol/week: 0.0 oz  . Drug use: No     Allergies   Ativan [lorazepam] and Lactulose   Review of Systems Review of Systems  Unable to perform ROS: Mental status change  Cardiovascular: Negative for chest pain.     Physical Exam Updated Vital Signs BP 136/80 (BP Location: Left Arm)   Pulse 86   Temp 98.6 F (37 C) (Oral)   Resp 18   SpO2 94%   Physical Exam  Constitutional: He appears well-developed.  HENT:  Head: Normocephalic.  Eyes: Conjunctivae and EOM are normal. No scleral icterus.  Neck: Neck supple. No thyromegaly  present.  Cardiovascular: Normal rate and regular rhythm. Exam reveals no gallop and no friction rub.  No murmur heard. Pulmonary/Chest: No stridor. He has no wheezes. He has no rales. He exhibits no tenderness.  Abdominal: He exhibits no distension. There is no tenderness. There is no rebound.  Musculoskeletal: Normal range of motion. He exhibits no edema.  Lymphadenopathy:    He has no cervical adenopathy.  Neurological: He is alert. He exhibits normal muscle tone. Coordination normal.  She is alert but he cannot talk normally because of the head injury  Skin: No rash noted. No erythema.     ED Treatments / Results  Labs (all labs ordered are listed, but only abnormal results are displayed) Labs Reviewed  CBC  WITH DIFFERENTIAL/PLATELET - Abnormal; Notable for the following components:      Result Value   RBC 3.96 (*)    Hemoglobin 12.4 (*)    HCT 37.8 (*)    All other components within normal limits  COMPREHENSIVE METABOLIC PANEL - Abnormal; Notable for the following components:   Glucose, Bld 101 (*)    Creatinine, Ser 1.25 (*)    Alkaline Phosphatase 146 (*)    All other components within normal limits  I-STAT TROPONIN, ED    EKG  EKG Interpretation  Date/Time:  Friday July 01 2017 16:25:33 EST Ventricular Rate:  69 PR Interval:    QRS Duration: 97 QT Interval:  402 QTC Calculation: 431 R Axis:   99 Text Interpretation:  Sinus rhythm Ventricular premature complex Borderline right axis deviation Probable left ventricular hypertrophy Confirmed by Bethann Berkshire [phone]) on 07/01/2017 7:08:23 PM       Radiology Ct Abdomen Pelvis Wo Contrast  Result Date: 07/01/2017 CLINICAL DATA:  47 y/o M; history of traumatic brain injury and nonverbal. Patient not eating or taking medications. EXAM: CT ABDOMEN AND PELVIS WITHOUT CONTRAST TECHNIQUE: Multidetector CT imaging of the abdomen and pelvis was performed following the standard protocol without IV contrast. COMPARISON:   06/03/2017 and 05/28/2017 CT abdomen and pelvis. FINDINGS: Lower chest: Numerous tiny centrilobular nodules in bronchovascular distribution throughout the right greater than left lung bases. Hepatobiliary: Mild lobulation of liver contour may represent cirrhosis. No focal liver abnormality is seen. Status post cholecystectomy. No biliary dilatation. Pancreas: Unremarkable. No pancreatic ductal dilatation or surrounding inflammatory changes. Spleen: Normal in size without focal abnormality. Adrenals/Urinary Tract: Adrenal glands are unremarkable. Stable right kidney interpolar punctate nonobstructing stone. Kidneys are otherwise normal, without renal calculi, focal lesion, or hydronephrosis. Bladder is unremarkable. Stomach/Bowel: Stomach is within normal limits. Appendix not identified, no pericecal inflammation. No evidence of bowel wall thickening, distention, or inflammatory changes. Large volume of stool throughout the colon. Vascular/Lymphatic: Aortic atherosclerosis. No enlarged abdominal or pelvic lymph nodes. Reproductive: Central prostatic calcification. Other: No abdominal wall hernia or abnormality. No abdominopelvic ascites. Musculoskeletal: No acute or significant osseous findings. IMPRESSION: 1. Tiny centrilobular nodules in bronchovascular distribution in the lung bases compatible small airways disease, possibly infectious/inflammatory bronchiolitis or aspiration given distribution. 2. Possibly early changes of cirrhosis. 3. Stable punctate right kidney nonobstructive nephrolithiasis. 4. Large volume of stool through colon. 5. Aortic atherosclerosis. 6. No acute process in the abdomen or pelvis identified. Electronically Signed   By: Mitzi Hansen M.D.   On: 07/01/2017 18:17   Dg Chest 2 View  Result Date: 07/01/2017 CLINICAL DATA:  Seizure.  Weakness. EXAM: CHEST - 2 VIEW COMPARISON:  06/03/2017 FINDINGS: Stable mild elevation of left hemidiaphragm. Both lungs are clear. Heart and  mediastinum are within normal limits. Trachea is midline. No pleural effusions. Stable compression deformity in the midthoracic spine. IMPRESSION: No active cardiopulmonary disease. Electronically Signed   By: Richarda Overlie M.D.   On: 07/01/2017 17:27    Procedures Procedures (including critical care time)  Medications Ordered in ED Medications  sodium chloride 0.9 % bolus 1,000 mL (1,000 mLs Intravenous New Bag/Given 07/01/17 1623)  doxycycline (VIBRA-TABS) tablet 100 mg (100 mg Oral Given 07/01/17 1935)     Initial Impression / Assessment and Plan / ED Course  I have reviewed the triage vital signs and the nursing notes.  Pertinent labs & imaging results that were available during my care of the patient were reviewed by me and considered in my medical  decision making (see chart for details).    X-ray shows constipation and possible infection in his lungs.  Patient will be placed on doxycycline and told to use milk of magnesia and is to follow-up with his PCP  Final Clinical Impressions(s) / ED Diagnoses   Final diagnoses:  Constipation, unspecified constipation type    ED Discharge Orders        Ordered    doxycycline (VIBRAMYCIN) 100 MG capsule  2 times daily     07/01/17 2013       Bethann Berkshire, MD 07/01/17 2016

## 2017-07-01 NOTE — ED Notes (Signed)
Bed: [id number] Expected date:  Expected time:  Means of arrival:  Comments: EMS-FTT

## 2017-07-02 NOTE — ED Notes (Signed)
Pt alert x4 nonverbal PTAR is here to transport the Pt home. Spoke with father who is aware that the Pt is in route. I will continue to monitor.

## 2017-07-02 NOTE — ED Notes (Signed)
Pt unable to sign his signature.

## 2017-07-07 ENCOUNTER — Other Ambulatory Visit: Payer: Self-pay | Admitting: *Deleted

## 2017-07-07 ENCOUNTER — Telehealth: Payer: Self-pay | Admitting: Neurology

## 2017-07-07 MED ORDER — LACOSAMIDE 150 MG PO TABS: 150.0000 mg | ORAL_TABLET | Freq: Two times a day (BID) | ORAL | 5 refills | Status: DC

## 2017-07-07 NOTE — Telephone Encounter (Signed)
Will fax Rx first thing in the morning after Jaffe signs it.

## 2017-07-07 NOTE — Telephone Encounter (Signed)
° °  1.     Which medications need to be refilled? (please list name of each medication and dose if know) vimpat he take twice a day    2.     Which pharmacy/location (including street and city if local pharmacy) is medication to be sent to?[facility] [address]   3.     Do they need a 30 or 90 day supply? 30   Patient has enough for today and that is it

## 2017-07-11 ENCOUNTER — Emergency Department (HOSPITAL_COMMUNITY): Payer: Medicaid Other

## 2017-07-11 ENCOUNTER — Emergency Department (HOSPITAL_COMMUNITY)
Admission: EM | Admit: 2017-07-11 | Discharge: 2017-07-11 | Disposition: A | Discharge status: Home / Self Care | Payer: Medicaid Other | Attending: Emergency Medicine | Admitting: Emergency Medicine

## 2017-07-11 ENCOUNTER — Telehealth: Payer: Self-pay | Admitting: Neurology

## 2017-07-11 DIAGNOSIS — Z87891 Personal history of nicotine dependence: Secondary | ICD-10-CM | POA: Insufficient documentation

## 2017-07-11 DIAGNOSIS — R251 Tremor, unspecified: Secondary | ICD-10-CM | POA: Insufficient documentation

## 2017-07-11 DIAGNOSIS — Z79899 Other long term (current) drug therapy: Secondary | ICD-10-CM | POA: Insufficient documentation

## 2017-07-11 DIAGNOSIS — R569 Unspecified convulsions: Secondary | ICD-10-CM | POA: Insufficient documentation

## 2017-07-11 DIAGNOSIS — Z87820 Personal history of traumatic brain injury: Secondary | ICD-10-CM | POA: Diagnosis not present

## 2017-07-11 LAB — COMPREHENSIVE METABOLIC PANEL
ALK PHOS: 150 U/L — AB (ref 38–126)
ALT: 22 U/L (ref 17–63)
AST: 30 U/L (ref 15–41)
Albumin: 4.5 g/dL (ref 3.5–5.0)
Anion gap: 9 (ref 5–15)
BUN: 23 mg/dL — AB (ref 6–20)
CHLORIDE: 103 mmol/L (ref 101–111)
CO2: 31 mmol/L (ref 22–32)
CREATININE: 1.2 mg/dL (ref 0.61–1.24)
Calcium: 10.1 mg/dL (ref 8.9–10.3)
GFR calc Af Amer: >60 mL/min (ref >60)
GFR calc non Af Amer: >60 mL/min (ref >60)
Glucose, Bld: 103 mg/dL — ABNORMAL HIGH (ref 65–99)
Potassium: 4.8 mmol/L (ref 3.5–5.1)
SODIUM: 143 mmol/L (ref 135–145)
Total Bilirubin: 0.3 mg/dL (ref 0.3–1.2)
Total Protein: 8.1 g/dL (ref 6.5–8.1)

## 2017-07-11 LAB — CBC WITH DIFFERENTIAL/PLATELET
Basophils Absolute: 0 10*3/uL (ref 0.0–0.1)
Basophils Relative: 0 %
EOS ABS: 0 10*3/uL (ref 0.0–0.7)
EOS PCT: 1 %
HCT: 36.1 % — ABNORMAL LOW (ref 39.0–52.0)
HEMOGLOBIN: 11.2 g/dL — AB (ref 13.0–17.0)
LYMPHS ABS: 1.2 10*3/uL (ref 0.7–4.0)
Lymphocytes Relative: 16 %
MCH: 29.9 pg (ref 26.0–34.0)
MCHC: 31 g/dL (ref 30.0–36.0)
MCV: 96.3 fL (ref 78.0–100.0)
MONOS PCT: 11 %
Monocytes Absolute: 0.8 10*3/uL (ref 0.1–1.0)
Neutro Abs: 5.6 10*3/uL (ref 1.7–7.7)
Neutrophils Relative %: 72 %
PLATELETS: 150 10*3/uL (ref 150–400)
RBC: 3.75 MIL/uL — ABNORMAL LOW (ref 4.22–5.81)
RDW: 13.1 % (ref 11.5–15.5)
WBC: 7.6 10*3/uL (ref 4.0–10.5)

## 2017-07-11 LAB — URINALYSIS, ROUTINE W REFLEX MICROSCOPIC
Bilirubin Urine: NEGATIVE
GLUCOSE, UA: NEGATIVE mg/dL
Hgb urine dipstick: NEGATIVE
KETONES UR: NEGATIVE mg/dL
Leukocytes, UA: NEGATIVE
Nitrite: NEGATIVE
PH: 7 (ref 5.0–8.0)
PROTEIN: NEGATIVE mg/dL
Specific Gravity, Urine: 1.012 (ref 1.005–1.030)

## 2017-07-11 LAB — CBG MONITORING, ED: GLUCOSE-CAPILLARY: 110 mg/dL — AB (ref 65–99)

## 2017-07-11 MED ORDER — SODIUM CHLORIDE 0.9 % IV BOLUS (SEPSIS)
500.0000 mL | Freq: Once | INTRAVENOUS | Status: AC
Start: 2017-07-11 — End: 2017-07-11
  Administered 2017-07-11: 500 mL via INTRAVENOUS

## 2017-07-11 NOTE — ED Notes (Signed)
Bed: [id number] Expected date:  Expected time:  Means of arrival:  Comments: EMS-twitching

## 2017-07-11 NOTE — ED Notes (Signed)
Pt's father and caretaker is at the bedside, complaining of Right Ankle pain

## 2017-07-11 NOTE — ED Provider Notes (Signed)
[facility]-EMERGENCY DEPT Provider Note   CSN: [id number] Arrival date & time: 07/11/17  1804     History   Chief Complaint Chief Complaint  Patient presents with  . Tremors  . Seizures    HPI Hector [name] is a 47 y.o. male.  47 year old male with prior history of seizure, TBI, ARF, and GERD presents with tremor.  Patient reportedly had a seizure this morning.  Following the seizure this morning,  patient has remained tremulous throughout the day. History primarily obtained from EMS and from father of patient. Patient has minimal speech capability following prior TBI and is difficult to understand.    The history is provided by the patient and medical records.  Seizures   This is a new problem. The problem has been gradually improving. There was 1 seizure. The most recent episode lasted less than 30 seconds. Pertinent negatives include no sleepiness, no confusion, no headaches, no visual disturbance, no neck stiffness and no chest pain. Characteristics do not include eye blinking, eye deviation or bowel incontinence. The episode was not witnessed. There was no sensation of an aura present. The seizures did not continue in the ED. There has been no fever.    Past Medical History:  Diagnosis Date  . Cirrhosis (HCC)   . Depression   . DVT (deep venous thrombosis) (HCC)    UE, LE DVT during hospitalization  . GERD (gastroesophageal reflux disease)   . H/O ETOH abuse   . H/O renal calculi   . Seizure (HCC)   . TBI (traumatic brain injury) (HCC)    07/2013, fall from standing post seizure  . Tremor     Patient Active Problem List   Diagnosis Date Noted  . Agitation   . Aspiration pneumonia due to gastric secretions (HCC)   . Hypoxia   . Coarse tremors 04/18/2017  . ARF (acute renal failure) (HCC) 04/18/2017  . Hypokalemia   . Hypomagnesemia   . Physical deconditioning   . FTT (failure to thrive) in adult 02/25/2017  . Oropharyngeal dysphagia  01/01/2017  . Orthostatic hypotension 01/01/2017  . Failure to thrive in adult 12/31/2016  . Vomiting   . Seizures (HCC) 06/01/2015  . Alcoholic cirrhosis of liver without ascites (HCC)   . Dehydration   . Encephalopathy acute 02/13/2015  . Altered mental status   . Ataxia 02/12/2015  . Localization-related symptomatic epilepsy and epileptic syndromes with complex partial seizures, not intractable, without status epilepticus (HCC) 08/16/2014  . Tobacco abuse 08/16/2014  . Acute kidney injury superimposed on CKD (HCC) 06/25/2014  . Dysphagia 06/25/2014  . Centrilobular emphysema (HCC) 01/27/2014  . Tracheostomy in place [facility]) 01/27/2014  . Encounter for family conference with patient present 01/23/2014  . Dvt femoral (deep venous thrombosis) (HCC) 01/05/2014  . Protein-calorie malnutrition, severe (HCC) 01/05/2014  . Alcohol abuse 01/05/2014  . Acute respiratory failure with hypoxia (HCC) 12/20/2013  . Delirium due to general medical condition 12/13/2013  . HCAP (healthcare-associated pneumonia) 12/13/2013  . SIRS (systemic inflammatory response syndrome) (HCC) 12/13/2013  . TBI (traumatic brain injury) (HCC) 12/12/2013    Past Surgical History:  Procedure Laterality Date  . BRAIN SURGERY    . GASTROSTOMY TUBE PLACEMENT    . RADIOLOGY WITH ANESTHESIA N/A 04/20/2017   Procedure: MRI WITH ANESTHESIA OF THE BRAIN;  Surgeon: Radiologist, Medication, MD;  Location: [facility] OR;  Service: Radiology;  Laterality: N/A;  . REMOVAL OF GASTROSTOMY TUBE    . TRACHEOSTOMY     feinstein  Home Medications    Prior to Admission medications   Medication Sig Start Date End Date Taking? Authorizing Provider  amantadine (SYMMETREL) 100 MG capsule TAKE 100 mg CAPSULE BY MOUTH TWICE A DAY 01/06/17   [provider]  bromocriptine (PARLODEL) 2.5 MG tablet Take 5 mg by mouth 2 (two) times daily.    [provider]  clonazePAM (KLONOPIN) 1 MG tablet Take 1 mg by mouth at bedtime.  12/24/16   [provider]  doxycycline (VIBRAMYCIN) 100 MG capsule Take 1 capsule (100 mg total) by mouth 2 (two) times daily. One po bid x 7 days 07/01/17   Bethann Berkshire, MD  escitalopram (LEXAPRO) 10 MG tablet Take 10 mg by mouth daily. 06/20/17   [provider]  feeding supplement, ENSURE ENLIVE, (ENSURE ENLIVE) LIQD Take 237 mLs 3 (three) times daily between meals by mouth. 02/28/17   Vassie Loll, MD  Lacosamide 150 MG TABS Take 1 tablet (150 mg total) by mouth 2 (two) times daily. 07/07/17 08/06/17  Everlena Cooper, Adam R, DO  ondansetron (ZOFRAN ODT) 8 MG disintegrating tablet Take 1 tablet (8 mg total) every 8 (eight) hours as needed by mouth for nausea or vomiting. 02/28/17   Vassie Loll, MD  oxcarbazepine (TRILEPTAL) 600 MG tablet TAKE 1 TABLET BY MOUTH TWICE A DAY Patient taking differently: TAKE 600 mg TABLET BY MOUTH TWICE A DAY 05/03/17   Jaffe, Adam R, DO  pantoprazole (PROTONIX) 40 MG tablet Take 40 mg by mouth daily.     [provider]  polyethylene glycol (MIRALAX / GLYCOLAX) packet Take 17 g by mouth daily as needed for moderate constipation.    [provider]  primidone (MYSOLINE) 50 MG tablet Take 50 mg by mouth at bedtime.    [provider]  QUEtiapine (SEROQUEL) 100 MG tablet 100 MG AT 10 AM, 100 MG AT 2 PM, 200 MG AT BEDTIME 06/15/17   [provider]    Family History Family History  Problem Relation Age of Onset  . Hypercholesterolemia Mother   . Hypertension Mother   . Congestive Heart Failure Brother        Deceased age 92    Social History Social History   Tobacco Use  . Smoking status: Former Smoker    Packs/day: 0.25    Years: 20.00    Pack years: 5.00    Types: Cigarettes    Last attempt to quit: 04/17/2017    Years since quitting: 0.2  . Smokeless tobacco: Never Used  Substance Use Topics  . Alcohol use: Yes    Alcohol/week: 0.0 oz  . Drug use: No     Allergies   Ativan [lorazepam] and  Lactulose   Review of Systems Review of Systems  Eyes: Negative for visual disturbance.  Cardiovascular: Negative for chest pain.  Gastrointestinal: Negative for bowel incontinence.  Neurological: Positive for tremors and seizures. Negative for headaches.  Psychiatric/Behavioral: Negative for confusion.  All other systems reviewed and are negative.    Physical Exam Updated Vital Signs BP 121/74 (BP Location: Left Arm)   Pulse 80   Temp 97.6 F (36.4 C) (Oral)   Resp (!) 24   Ht 5\' 10"  (1.778 m)   Wt 52.2 kg (115 lb)   SpO2 95%   BMI 16.50 kg/m   Physical Exam  Constitutional: He appears well-developed and well-nourished. No distress.  HENT:  Head: Normocephalic and atraumatic.  Mouth/Throat: Oropharynx is clear and moist.  Eyes: Conjunctivae and EOM are normal. Pupils  are equal, round, and reactive to light.  Neck: Normal range of motion. Neck supple.  Cardiovascular: Normal rate, regular rhythm and normal heart sounds.  Pulmonary/Chest: Effort normal and breath sounds normal. No respiratory distress.  Abdominal: Soft. He exhibits no distension. There is no tenderness.  Musculoskeletal: Normal range of motion. He exhibits no edema or deformity.  Neurological: He is alert.  Alert, at baseline  Speech garbled and difficult to understand (baseline)  No focal neuro finding on exam   Mild intermittent tremor noted to distal extremities   Skin: Skin is warm and dry.  Psychiatric: He has a normal mood and affect.  Nursing note and vitals reviewed.    ED Treatments / Results  Labs (all labs ordered are listed, but only abnormal results are displayed) Labs Reviewed  COMPREHENSIVE METABOLIC PANEL - Abnormal; Notable for the following components:      Result Value   Glucose, Bld 103 (*)    BUN 23 (*)    Alkaline Phosphatase 150 (*)    All other components within normal limits  CBC WITH DIFFERENTIAL/PLATELET - Abnormal; Notable for the following components:   RBC  3.75 (*)    Hemoglobin 11.2 (*)    HCT 36.1 (*)    All other components within normal limits  CBG MONITORING, ED - Abnormal; Notable for the following components:   Glucose-Capillary 110 (*)    All other components within normal limits  URINALYSIS, ROUTINE W REFLEX MICROSCOPIC  CBG MONITORING, ED    EKG  EKG Interpretation  Date/Time:  Monday July 11 2017 19:21:07 EDT Ventricular Rate:  83 PR Interval:    QRS Duration: 106 QT Interval:  375 QTC Calculation: 441 R Axis:   79 Text Interpretation:  Sinus rhythm ST elev, probable normal early repol pattern Confirmed by Kristine RoyalMessick, Mirai Greenwood [id number]) on 07/11/2017 7:38:26 PM       Radiology Dg Chest Port 1 View  Result Date: 07/11/2017 CLINICAL DATA:  Seizure this morning with tremors. EXAM: PORTABLE CHEST 1 VIEW COMPARISON:  07/01/2017 FINDINGS: The heart size and mediastinal contours are within normal limits. Stable mild elevation of the left hemidiaphragm unchanged in appearance. Midline trachea without deviation. Both lungs are clear. The visualized skeletal structures are unremarkable. IMPRESSION: No active disease. Electronically Signed   By: Tollie Ethavid  Kwon M.D.   On: 07/11/2017 19:24    Procedures Procedures (including critical care time)  Medications Ordered in ED Medications  sodium chloride 0.9 % bolus 500 mL (500 mLs Intravenous New Bag/Given 07/11/17 1928)     Initial Impression / Assessment and Plan / ED Course  I have reviewed the triage vital signs and the nursing notes.  Pertinent labs & imaging results that were available during my care of the patient were reviewed by me and considered in my medical decision making (see chart for details).     MDM  Screen complete  Patient is presenting for evaluation of reported tremor. Patient appears to be at his mental status baseline.  It is unclear to me that his reported tremor is actually acute. Screening labs are without significant abnormality. Patient desires DC home. He  appears comfortable and without complaint at time of discharge.    Final Clinical Impressions(s) / ED Diagnoses   Final diagnoses:  Tremor    ED Discharge Orders    None       Wynetta FinesMessick, Felice Deem C, MD 07/11/17 2204
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
## 2017-07-11 NOTE — Telephone Encounter (Signed)
Morrie Sheldonshley please refer to note on 07/07/17 about patients medication

## 2017-07-11 NOTE — Telephone Encounter (Signed)
I spoke with patient's dad and informed him that the vimpat was picked up today and the seroquel is ready.

## 2017-07-11 NOTE — ED Notes (Signed)
X ray in room.

## 2017-07-11 NOTE — Telephone Encounter (Signed)
°  1.     Which medications need to be refilled? (please list name of each medication and dose if know) Vimpat  2.     Which pharmacy/location (including street and city if local pharmacy) is medication to be sent to?   3.     Do they need a 30 or 90 day supply?   Pt's father Konrad DoloresLester called and said he has been trying to get the Vimpat filled for a couple days now and the pharmacy said they have not received anything from us, he also states pt's tremors are worse and also is having difficulty walking, would like a call back regarding this

## 2017-07-11 NOTE — ED Notes (Signed)
CBG 122 

## 2017-07-13 ENCOUNTER — Inpatient Hospital Stay (HOSPITAL_COMMUNITY)
Admission: EM | Admit: 2017-07-13 | Discharge: 2017-07-18 | DRG: 100 | Disposition: A | Discharge status: Home / Self Care | Payer: Medicaid Other | Attending: Internal Medicine | Admitting: Internal Medicine

## 2017-07-13 ENCOUNTER — Encounter (HOSPITAL_COMMUNITY): Payer: Self-pay | Admitting: Emergency Medicine

## 2017-07-13 ENCOUNTER — Emergency Department (HOSPITAL_COMMUNITY): Payer: Medicaid Other

## 2017-07-13 ENCOUNTER — Other Ambulatory Visit: Payer: Self-pay

## 2017-07-13 ENCOUNTER — Telehealth: Payer: Self-pay | Admitting: Neurology

## 2017-07-13 DIAGNOSIS — Z87820 Personal history of traumatic brain injury: Secondary | ICD-10-CM

## 2017-07-13 DIAGNOSIS — R1310 Dysphagia, unspecified: Secondary | ICD-10-CM | POA: Diagnosis present

## 2017-07-13 DIAGNOSIS — R402413 Glasgow coma scale score 13-15, at hospital admission: Secondary | ICD-10-CM | POA: Diagnosis present

## 2017-07-13 DIAGNOSIS — Z889 Allergy status to unspecified drugs, medicaments and biological substances status: Secondary | ICD-10-CM

## 2017-07-13 DIAGNOSIS — Z930 Tracheostomy status: Secondary | ICD-10-CM

## 2017-07-13 DIAGNOSIS — Z86718 Personal history of other venous thrombosis and embolism: Secondary | ICD-10-CM

## 2017-07-13 DIAGNOSIS — G9340 Encephalopathy, unspecified: Secondary | ICD-10-CM | POA: Diagnosis present

## 2017-07-13 DIAGNOSIS — R4182 Altered mental status, unspecified: Secondary | ICD-10-CM | POA: Diagnosis present

## 2017-07-13 DIAGNOSIS — G9389 Other specified disorders of brain: Secondary | ICD-10-CM | POA: Diagnosis present

## 2017-07-13 DIAGNOSIS — F0390 Unspecified dementia without behavioral disturbance: Secondary | ICD-10-CM | POA: Diagnosis present

## 2017-07-13 DIAGNOSIS — G9341 Metabolic encephalopathy: Secondary | ICD-10-CM | POA: Diagnosis present

## 2017-07-13 DIAGNOSIS — E860 Dehydration: Secondary | ICD-10-CM | POA: Diagnosis present

## 2017-07-13 DIAGNOSIS — Z888 Allergy status to other drugs, medicaments and biological substances status: Secondary | ICD-10-CM

## 2017-07-13 DIAGNOSIS — R7989 Other specified abnormal findings of blood chemistry: Secondary | ICD-10-CM | POA: Diagnosis present

## 2017-07-13 DIAGNOSIS — Z8669 Personal history of other diseases of the nervous system and sense organs: Secondary | ICD-10-CM

## 2017-07-13 DIAGNOSIS — E43 Unspecified severe protein-calorie malnutrition: Secondary | ICD-10-CM | POA: Diagnosis present

## 2017-07-13 DIAGNOSIS — G252 Other specified forms of tremor: Secondary | ICD-10-CM | POA: Diagnosis present

## 2017-07-13 DIAGNOSIS — R296 Repeated falls: Secondary | ICD-10-CM | POA: Diagnosis present

## 2017-07-13 DIAGNOSIS — R443 Hallucinations, unspecified: Secondary | ICD-10-CM

## 2017-07-13 DIAGNOSIS — Z9119 Patient's noncompliance with other medical treatment and regimen: Secondary | ICD-10-CM

## 2017-07-13 DIAGNOSIS — Z87891 Personal history of nicotine dependence: Secondary | ICD-10-CM

## 2017-07-13 DIAGNOSIS — R627 Adult failure to thrive: Secondary | ICD-10-CM | POA: Diagnosis present

## 2017-07-13 DIAGNOSIS — Z79899 Other long term (current) drug therapy: Secondary | ICD-10-CM

## 2017-07-13 DIAGNOSIS — R825 Elevated urine levels of drugs, medicaments and biological substances: Secondary | ICD-10-CM | POA: Diagnosis present

## 2017-07-13 DIAGNOSIS — W19XXXA Unspecified fall, initial encounter: Secondary | ICD-10-CM | POA: Diagnosis not present

## 2017-07-13 DIAGNOSIS — G40909 Epilepsy, unspecified, not intractable, without status epilepticus: Principal | ICD-10-CM | POA: Diagnosis present

## 2017-07-13 DIAGNOSIS — Y92238 Other place in hospital as the place of occurrence of the external cause: Secondary | ICD-10-CM | POA: Diagnosis not present

## 2017-07-13 DIAGNOSIS — N189 Chronic kidney disease, unspecified: Secondary | ICD-10-CM | POA: Diagnosis present

## 2017-07-13 DIAGNOSIS — K219 Gastro-esophageal reflux disease without esophagitis: Secondary | ICD-10-CM | POA: Diagnosis present

## 2017-07-13 DIAGNOSIS — S069X9A Unspecified intracranial injury with loss of consciousness of unspecified duration, initial encounter: Secondary | ICD-10-CM | POA: Diagnosis present

## 2017-07-13 DIAGNOSIS — N179 Acute kidney failure, unspecified: Secondary | ICD-10-CM | POA: Diagnosis present

## 2017-07-13 DIAGNOSIS — F1020 Alcohol dependence, uncomplicated: Secondary | ICD-10-CM | POA: Diagnosis present

## 2017-07-13 DIAGNOSIS — S069XAA Unspecified intracranial injury with loss of consciousness status unknown, initial encounter: Secondary | ICD-10-CM | POA: Diagnosis present

## 2017-07-13 DIAGNOSIS — F05 Delirium due to known physiological condition: Secondary | ICD-10-CM | POA: Diagnosis present

## 2017-07-13 DIAGNOSIS — K7030 Alcoholic cirrhosis of liver without ascites: Secondary | ICD-10-CM | POA: Diagnosis present

## 2017-07-13 DIAGNOSIS — F329 Major depressive disorder, single episode, unspecified: Secondary | ICD-10-CM | POA: Diagnosis present

## 2017-07-13 DIAGNOSIS — Z781 Physical restraint status: Secondary | ICD-10-CM

## 2017-07-13 DIAGNOSIS — R4701 Aphasia: Secondary | ICD-10-CM | POA: Diagnosis present

## 2017-07-13 DIAGNOSIS — S069X9S Unspecified intracranial injury with loss of consciousness of unspecified duration, sequela: Secondary | ICD-10-CM

## 2017-07-13 DIAGNOSIS — R569 Unspecified convulsions: Secondary | ICD-10-CM

## 2017-07-13 DIAGNOSIS — S62617A Displaced fracture of proximal phalanx of left little finger, initial encounter for closed fracture: Secondary | ICD-10-CM | POA: Diagnosis not present

## 2017-07-13 DIAGNOSIS — R451 Restlessness and agitation: Secondary | ICD-10-CM | POA: Diagnosis present

## 2017-07-13 MED ORDER — PRIMIDONE 50 MG PO TABS: 100.0000 mg | ORAL_TABLET | Freq: Every day | ORAL | 3 refills | Status: DC
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
## 2017-07-13 NOTE — ED Triage Notes (Signed)
Patient BIB [facility] from home. Family reports pt has been having visual hallucinations x2 days, had multiple seizures yesterday, and has been having frequent falls. Pt has hx of TBI in 2015 and epilepsy. Family reports pt is agitated and combative at times, pt has been a/o x2 and cooperative with EMS. Pt ambulated on scene with EMS. Speech and tremor normal to baseline.

## 2017-07-13 NOTE — Telephone Encounter (Signed)
Patient's father in agree to do 100 mg of Primidone. Sent in new Rx to [facility] pharmacy for patient to have refills on and hope this medication will work for him. Patient's father was a little concern but will attempt it and will call back in a few weeks if no changes to his tremors.

## 2017-07-13 NOTE — Telephone Encounter (Signed)
That would not make tremor worse, although it is possible going off of the topamax could.  He is on low dose primidone and could actually increase it to 100 mg at night if he would like.  Make sure that he has appt with Dr. Everlena CooperJaffe

## 2017-07-13 NOTE — Telephone Encounter (Signed)
Patient's father Theone StanleyLester Ricci states his son is getting worse with his tremors and states could it possible be due to the new medication Primidone 50 mg prescribed by Dr. Everlena CooperJaffe on 05/19/2017. Patient was seen recently at the hospital on 07/11/17 for his seizures and tremors. Patient is able to still drink fluids, and still drinking 3 Ensures a day. Patient's father wants to know if he needs to be on another medication.

## 2017-07-13 NOTE — ED Provider Notes (Signed)
[facility]-EMERGENCY DEPT Provider Note   CSN: [id number] Arrival date & time: 07/13/17  2054     History   Chief Complaint Chief Complaint  Patient presents with  . Hallucinations  . Seizures    HPI Hector [name] is a 47 y.o. male.  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  Reportedly family states that he has had visual hallucinations x 2 days and had multiple seizures yesterday.  He also has frequent falls. Level 5 caveat applies 2/2 patient being non-verbal at this time.   Attempted to contact family, but no answer.  Hx obtained from EMS and prior records.   The history is provided by the patient and medical records. No language interpreter was used.    Past Medical History:  Diagnosis Date  . Cirrhosis (HCC)   . Depression   . DVT (deep venous thrombosis) (HCC)    UE, LE DVT during hospitalization  . GERD (gastroesophageal reflux disease)   . H/O ETOH abuse   . H/O renal calculi   . Seizure (HCC)   . TBI (traumatic brain injury) (HCC)    07/2013, fall from standing post seizure  . Tremor     Patient Active Problem List   Diagnosis Date Noted  . Agitation   . Aspiration pneumonia due to gastric secretions (HCC)   . Hypoxia   . Coarse tremors 04/18/2017  . ARF (acute renal failure) (HCC) 04/18/2017  . Hypokalemia   . Hypomagnesemia   . Physical deconditioning   . FTT (failure to thrive) in adult 02/25/2017  . Oropharyngeal dysphagia 01/01/2017  . Orthostatic hypotension 01/01/2017  . Failure to thrive in adult 12/31/2016  . Vomiting   . Seizures (HCC) 06/01/2015  . Alcoholic cirrhosis of liver without ascites (HCC)   . Dehydration   . Encephalopathy acute 02/13/2015  . Altered mental status   . Ataxia 02/12/2015  . Localization-related symptomatic epilepsy and epileptic syndromes with complex partial seizures, not intractable,  without status epilepticus (HCC) 08/16/2014  . Tobacco abuse 08/16/2014  . Acute kidney injury superimposed on CKD (HCC) 06/25/2014  . Dysphagia 06/25/2014  . Centrilobular emphysema (HCC) 01/27/2014  . Tracheostomy in place [facility]) 01/27/2014  . Encounter for family conference with patient present 01/23/2014  . Dvt femoral (deep venous thrombosis) (HCC) 01/05/2014  . Protein-calorie malnutrition, severe (HCC) 01/05/2014  . Alcohol abuse 01/05/2014  . Acute respiratory failure with hypoxia (HCC) 12/20/2013  . Delirium due to general medical condition 12/13/2013  . HCAP (healthcare-associated pneumonia) 12/13/2013  . SIRS (systemic inflammatory response syndrome) (HCC) 12/13/2013  . TBI (traumatic brain injury) (HCC) 12/12/2013    Past Surgical History:  Procedure Laterality Date  . BRAIN SURGERY    . GASTROSTOMY TUBE PLACEMENT    . RADIOLOGY WITH ANESTHESIA N/A 04/20/2017   Procedure: MRI WITH ANESTHESIA OF THE BRAIN;  Surgeon: Radiologist, Medication, MD;  Location: [facility] OR;  Service: Radiology;  Laterality: N/A;  . REMOVAL OF GASTROSTOMY TUBE    . TRACHEOSTOMY     feinstein       Home Medications    Prior to Admission medications   Medication Sig Start Date End Date Taking? Authorizing Provider  amantadine (SYMMETREL) 100 MG capsule TAKE 100 mg CAPSULE BY MOUTH TWICE A DAY 01/06/17  Yes [provider]  bromocriptine (PARLODEL) 2.5 MG tablet Take 5 mg by mouth 2 (two) times daily.  Yes [provider]  clonazePAM (KLONOPIN) 1 MG tablet Take 1 mg by mouth at bedtime. 12/24/16  Yes [provider]  escitalopram (LEXAPRO) 10 MG tablet Take 10 mg by mouth daily. 06/20/17  Yes [provider]  feeding supplement, ENSURE ENLIVE, (ENSURE ENLIVE) LIQD Take 237 mLs 3 (three) times daily between meals by mouth. 02/28/17  Yes Vassie LollMadera, Carlos, MD  Lacosamide 150 MG TABS Take 1 tablet (150 mg total) by mouth 2 (two) times daily. 07/07/17 08/06/17 Yes Jaffe, Adam  R, DO  ondansetron (ZOFRAN ODT) 8 MG disintegrating tablet Take 1 tablet (8 mg total) every 8 (eight) hours as needed by mouth for nausea or vomiting. 02/28/17  Yes Vassie LollMadera, Carlos, MD  oxcarbazepine (TRILEPTAL) 600 MG tablet TAKE 1 TABLET BY MOUTH TWICE A DAY Patient taking differently: TAKE 600 mg TABLET BY MOUTH TWICE A DAY 05/03/17  Yes Jaffe, Adam R, DO  pantoprazole (PROTONIX) 40 MG tablet Take 40 mg by mouth daily.    Yes [provider]  polyethylene glycol (MIRALAX / GLYCOLAX) packet Take 17 g by mouth daily as needed for moderate constipation.   Yes [provider]  primidone (MYSOLINE) 50 MG tablet Take 2 tablets (100 mg total) by mouth at bedtime. 07/13/17  Yes Tat, Octaviano Battyebecca S, DO  QUEtiapine (SEROQUEL) 100 MG tablet 100 MG AT 10 AM, 100 MG AT 2 PM, 200 MG AT BEDTIME 06/15/17  Yes [provider]  doxycycline (VIBRAMYCIN) 100 MG capsule Take 1 capsule (100 mg total) by mouth 2 (two) times daily. One po bid x 7 days Patient not taking: Reported on 07/13/2017 07/01/17   Bethann BerkshireZammit, Joseph, MD    Family History Family History  Problem Relation Age of Onset  . Hypercholesterolemia Mother   . Hypertension Mother   . Congestive Heart Failure Brother        Deceased age 49    Social History Social History   Tobacco Use  . Smoking status: Former Smoker    Packs/day: 0.25    Years: 20.00    Pack years: 5.00    Types: Cigarettes    Last attempt to quit: 04/17/2017    Years since quitting: 0.2  . Smokeless tobacco: Never Used  Substance Use Topics  . Alcohol use: Yes    Alcohol/week: 0.0 oz  . Drug use: No     Allergies   Ativan [lorazepam] and Lactulose   Review of Systems Review of Systems  Unable to perform ROS: Patient nonverbal     Physical Exam Updated Vital Signs BP 127/74 (BP Location: Left Arm)   Pulse (!) 109   Temp 98.3 F (36.8 C) (Oral)   Resp 19   SpO2 98% Comment: 2L Simultaneous filing. User may not have seen previous  data.  Physical Exam  Constitutional:  Frail appearing Urinated on self  HENT:  Head: Normocephalic and atraumatic.  Abrasion to bridge of nose, no tenderness  Eyes: Conjunctivae and EOM are normal. Pupils are equal, round, and reactive to light. Right eye exhibits no discharge. Left eye exhibits no discharge. No scleral icterus.  Neck: Normal range of motion. Neck supple. No JVD present.  Cardiovascular: Normal rate, regular rhythm and normal heart sounds. Exam reveals no gallop and no friction rub.  No murmur heard. Pulmonary/Chest: Effort normal and breath sounds normal. No respiratory distress. He has no wheezes. He has no rales. He exhibits no tenderness.  Abdominal: Soft. He exhibits no distension and no mass. There is no tenderness. There  is no rebound and no guarding.  Musculoskeletal: Normal range of motion. He exhibits no edema or tenderness.  Left small finger bruised and tender to palpation and painful with ROM Remaining extremities are without obvious bony abnormality or deformity No TTP of extremities save for left small finger  Neurological: He is alert.  Unable to adequately test orientation 2/2 incomprehensible speech Moves all extremities Resting tremor in upper and lower extremities  Skin: Skin is warm and dry.  Psychiatric: He has a normal mood and affect. His behavior is normal. Judgment and thought content normal.  Nursing note and vitals reviewed.    ED Treatments / Results  Labs (all labs ordered are listed, but only abnormal results are displayed) Labs Reviewed  CBC WITH DIFFERENTIAL/PLATELET  COMPREHENSIVE METABOLIC PANEL  CBG MONITORING, ED    EKG  EKG Interpretation None       Radiology No results found.  Procedures Procedures (including critical care time)  Medications Ordered in ED Medications - No data to display   Initial Impression / Assessment and Plan / ED Course  I have reviewed the triage vital signs and the nursing  notes.  Pertinent labs & imaging results that were available during my care of the patient were reviewed by me and considered in my medical decision making (see chart for details).     Patient with fall, seizures, hx of TBI, reportedly having hallucinations.  Unclear whether patient is taking home meds.  Have tried unsuccessfully to reach out to family.  Patient calm and cooperative.  Per RN, family reportedly concerned about seizures being more frequent.  Spoke with Dr. Amada Jupiter, who states that since pt is on 600 mg Trileptal BID, we can add 500 mg Keppra BID.  Noted that Cr is 1.50, ok per Dr. Amada Jupiter.  At this time patient is medically clear, but will consult TTS regarding hallucinations to see if anything else can be done.  Otherwise, plan for DC to home with outpatient follow-up.  Multiple attempts to contact family have been made without success.  4:20 AM Psych to see patient in AM regarding hallucinations.   Patient signed out to [facility], who will follow-up on psych assessment.  If able to be discharged, then home with Keppra per neurology.  Final Clinical Impressions(s) / ED Diagnoses   Final diagnoses:  None    ED Discharge Orders    None       Trysten, Bernard, PA-C 07/14/17 0981    Dione Booze, MD 07/14/17 [phone]

## 2017-07-14 ENCOUNTER — Other Ambulatory Visit: Payer: Self-pay

## 2017-07-14 DIAGNOSIS — F0390 Unspecified dementia without behavioral disturbance: Secondary | ICD-10-CM | POA: Diagnosis present

## 2017-07-14 DIAGNOSIS — N189 Chronic kidney disease, unspecified: Secondary | ICD-10-CM | POA: Diagnosis not present

## 2017-07-14 DIAGNOSIS — Z781 Physical restraint status: Secondary | ICD-10-CM | POA: Diagnosis not present

## 2017-07-14 DIAGNOSIS — E43 Unspecified severe protein-calorie malnutrition: Secondary | ICD-10-CM

## 2017-07-14 DIAGNOSIS — R402413 Glasgow coma scale score 13-15, at hospital admission: Secondary | ICD-10-CM | POA: Diagnosis present

## 2017-07-14 DIAGNOSIS — G9340 Encephalopathy, unspecified: Secondary | ICD-10-CM

## 2017-07-14 DIAGNOSIS — F1020 Alcohol dependence, uncomplicated: Secondary | ICD-10-CM | POA: Diagnosis present

## 2017-07-14 DIAGNOSIS — R569 Unspecified convulsions: Secondary | ICD-10-CM | POA: Diagnosis not present

## 2017-07-14 DIAGNOSIS — G9389 Other specified disorders of brain: Secondary | ICD-10-CM | POA: Diagnosis present

## 2017-07-14 DIAGNOSIS — N179 Acute kidney failure, unspecified: Secondary | ICD-10-CM

## 2017-07-14 DIAGNOSIS — K7030 Alcoholic cirrhosis of liver without ascites: Secondary | ICD-10-CM | POA: Diagnosis present

## 2017-07-14 DIAGNOSIS — F329 Major depressive disorder, single episode, unspecified: Secondary | ICD-10-CM | POA: Diagnosis present

## 2017-07-14 DIAGNOSIS — S62601A Fracture of unspecified phalanx of left index finger, initial encounter for closed fracture: Secondary | ICD-10-CM

## 2017-07-14 DIAGNOSIS — Z930 Tracheostomy status: Secondary | ICD-10-CM | POA: Diagnosis not present

## 2017-07-14 DIAGNOSIS — R451 Restlessness and agitation: Secondary | ICD-10-CM | POA: Diagnosis not present

## 2017-07-14 DIAGNOSIS — G9341 Metabolic encephalopathy: Secondary | ICD-10-CM | POA: Diagnosis present

## 2017-07-14 DIAGNOSIS — F05 Delirium due to known physiological condition: Secondary | ICD-10-CM | POA: Diagnosis present

## 2017-07-14 DIAGNOSIS — R4701 Aphasia: Secondary | ICD-10-CM | POA: Diagnosis present

## 2017-07-14 DIAGNOSIS — W19XXXA Unspecified fall, initial encounter: Secondary | ICD-10-CM | POA: Diagnosis not present

## 2017-07-14 DIAGNOSIS — G40909 Epilepsy, unspecified, not intractable, without status epilepticus: Secondary | ICD-10-CM | POA: Diagnosis not present

## 2017-07-14 DIAGNOSIS — R296 Repeated falls: Secondary | ICD-10-CM | POA: Diagnosis present

## 2017-07-14 DIAGNOSIS — R4182 Altered mental status, unspecified: Secondary | ICD-10-CM | POA: Diagnosis not present

## 2017-07-14 DIAGNOSIS — Z8669 Personal history of other diseases of the nervous system and sense organs: Secondary | ICD-10-CM

## 2017-07-14 DIAGNOSIS — R443 Hallucinations, unspecified: Secondary | ICD-10-CM | POA: Diagnosis not present

## 2017-07-14 DIAGNOSIS — Z87820 Personal history of traumatic brain injury: Secondary | ICD-10-CM | POA: Diagnosis not present

## 2017-07-14 DIAGNOSIS — G252 Other specified forms of tremor: Secondary | ICD-10-CM | POA: Diagnosis present

## 2017-07-14 DIAGNOSIS — S069X9S Unspecified intracranial injury with loss of consciousness of unspecified duration, sequela: Secondary | ICD-10-CM | POA: Diagnosis not present

## 2017-07-14 DIAGNOSIS — S069X5D Unspecified intracranial injury with loss of consciousness greater than 24 hours with return to pre-existing conscious level, subsequent encounter: Secondary | ICD-10-CM | POA: Diagnosis not present

## 2017-07-14 DIAGNOSIS — Y92238 Other place in hospital as the place of occurrence of the external cause: Secondary | ICD-10-CM | POA: Diagnosis not present

## 2017-07-14 DIAGNOSIS — K219 Gastro-esophageal reflux disease without esophagitis: Secondary | ICD-10-CM | POA: Diagnosis present

## 2017-07-14 DIAGNOSIS — S62617A Displaced fracture of proximal phalanx of left little finger, initial encounter for closed fracture: Secondary | ICD-10-CM | POA: Diagnosis not present

## 2017-07-14 DIAGNOSIS — R1310 Dysphagia, unspecified: Secondary | ICD-10-CM | POA: Diagnosis present

## 2017-07-14 DIAGNOSIS — E860 Dehydration: Secondary | ICD-10-CM | POA: Diagnosis present

## 2017-07-14 LAB — RAPID URINE DRUG SCREEN, HOSP PERFORMED
Amphetamines: NOT DETECTED
Barbiturates: POSITIVE — AB
Benzodiazepines: NOT DETECTED
Cocaine: NOT DETECTED
OPIATES: NOT DETECTED
Tetrahydrocannabinol: NOT DETECTED

## 2017-07-14 LAB — URINALYSIS, ROUTINE W REFLEX MICROSCOPIC
BILIRUBIN URINE: NEGATIVE
Glucose, UA: NEGATIVE mg/dL
Hgb urine dipstick: NEGATIVE
KETONES UR: 5 mg/dL — AB
Leukocytes, UA: NEGATIVE
NITRITE: NEGATIVE
Protein, ur: NEGATIVE mg/dL
Specific Gravity, Urine: 1.011 (ref 1.005–1.030)
pH: 5 (ref 5.0–8.0)

## 2017-07-14 LAB — CBC WITH DIFFERENTIAL/PLATELET
BASOS ABS: 0 10*3/uL (ref 0.0–0.1)
Basophils Relative: 0 %
EOS ABS: 0 10*3/uL (ref 0.0–0.7)
EOS PCT: 0 %
HCT: 33.6 % — ABNORMAL LOW (ref 39.0–52.0)
Hemoglobin: 10.8 g/dL — ABNORMAL LOW (ref 13.0–17.0)
Lymphocytes Relative: 9 %
Lymphs Abs: 0.8 10*3/uL (ref 0.7–4.0)
MCH: 30.5 pg (ref 26.0–34.0)
MCHC: 32.1 g/dL (ref 30.0–36.0)
MCV: 94.9 fL (ref 78.0–100.0)
Monocytes Absolute: 0.8 10*3/uL (ref 0.1–1.0)
Monocytes Relative: 9 %
Neutro Abs: 7.7 10*3/uL (ref 1.7–7.7)
Neutrophils Relative %: 82 %
PLATELETS: 129 10*3/uL — AB (ref 150–400)
RBC: 3.54 MIL/uL — AB (ref 4.22–5.81)
RDW: 13.2 % (ref 11.5–15.5)
WBC: 9.4 10*3/uL (ref 4.0–10.5)

## 2017-07-14 LAB — COMPREHENSIVE METABOLIC PANEL
ALT: 34 U/L (ref 17–63)
AST: 49 U/L — AB (ref 15–41)
Albumin: 4.5 g/dL (ref 3.5–5.0)
Alkaline Phosphatase: 172 U/L — ABNORMAL HIGH (ref 38–126)
Anion gap: 11 (ref 5–15)
BUN: 42 mg/dL — AB (ref 6–20)
CO2: 28 mmol/L (ref 22–32)
CREATININE: 1.5 mg/dL — AB (ref 0.61–1.24)
Calcium: 10.1 mg/dL (ref 8.9–10.3)
Chloride: 107 mmol/L (ref 101–111)
GFR calc non Af Amer: 53 mL/min — ABNORMAL LOW (ref >60)
Glucose, Bld: 115 mg/dL — ABNORMAL HIGH (ref 65–99)
POTASSIUM: 4.4 mmol/L (ref 3.5–5.1)
SODIUM: 146 mmol/L — AB (ref 135–145)
Total Bilirubin: 0.5 mg/dL (ref 0.3–1.2)
Total Protein: 8.4 g/dL — ABNORMAL HIGH (ref 6.5–8.1)

## 2017-07-14 LAB — VITAMIN B12: Vitamin B-12: 409 pg/mL (ref 180–914)

## 2017-07-14 LAB — TSH: TSH: 0.578 u[IU]/mL (ref 0.350–4.500)

## 2017-07-14 LAB — CBC
HEMATOCRIT: 30 % — AB (ref 39.0–52.0)
HEMOGLOBIN: 9.7 g/dL — AB (ref 13.0–17.0)
MCH: 30.9 pg (ref 26.0–34.0)
MCHC: 32.3 g/dL (ref 30.0–36.0)
MCV: 95.5 fL (ref 78.0–100.0)
Platelets: 108 10*3/uL — ABNORMAL LOW (ref 150–400)
RBC: 3.14 MIL/uL — AB (ref 4.22–5.81)
RDW: 13.5 % (ref 11.5–15.5)
WBC: 6.4 10*3/uL (ref 4.0–10.5)

## 2017-07-14 LAB — CBG MONITORING, ED: Glucose-Capillary: 99 mg/dL (ref 65–99)

## 2017-07-14 LAB — CREATININE, SERUM: Creatinine, Ser: 1.31 mg/dL — ABNORMAL HIGH (ref 0.61–1.24)

## 2017-07-14 LAB — AMMONIA: Ammonia: 15 umol/L (ref 9–35)

## 2017-07-14 MED ORDER — SODIUM CHLORIDE 0.9 % IV BOLUS (SEPSIS): 1000.0000 mL | Freq: Once | INTRAVENOUS | Status: DC

## 2017-07-14 MED ORDER — SODIUM CHLORIDE 0.9 % IV SOLN: INTRAVENOUS | Status: DC

## 2017-07-14 MED ORDER — SENNOSIDES-DOCUSATE SODIUM 8.6-50 MG PO TABS: 1.0000 | ORAL_TABLET | Freq: Every evening | ORAL | Status: DC | PRN

## 2017-07-14 MED ORDER — HEPARIN SODIUM (PORCINE) 5000 UNIT/ML IJ SOLN
5000.0000 [IU] | Freq: Three times a day (TID) | INTRAMUSCULAR | Status: DC
  Administered 2017-07-14 – 2017-07-17 (×10): 5000 [IU] via SUBCUTANEOUS
  Filled 2017-07-14 (×10): qty 1

## 2017-07-14 MED ORDER — SODIUM CHLORIDE 0.9 % IV SOLN
INTRAVENOUS | Status: DC
  Administered 2017-07-14 – 2017-07-17 (×3): via INTRAVENOUS

## 2017-07-14 MED ORDER — LEVETIRACETAM 500 MG PO TABS
500.0000 mg | ORAL_TABLET | Freq: Once | ORAL | Status: AC
  Administered 2017-07-14: 500 mg via ORAL
  Filled 2017-07-14: qty 1

## 2017-07-14 MED ORDER — CLONAZEPAM 1 MG PO TABS
1.0000 mg | ORAL_TABLET | Freq: Every day | ORAL | Status: DC
  Administered 2017-07-14 – 2017-07-17 (×4): 1 mg via ORAL
  Filled 2017-07-14 (×4): qty 1

## 2017-07-14 MED ORDER — LACOSAMIDE 50 MG PO TABS
150.0000 mg | ORAL_TABLET | Freq: Two times a day (BID) | ORAL | Status: DC
  Administered 2017-07-14 – 2017-07-18 (×9): 150 mg via ORAL
  Filled 2017-07-14 (×9): qty 3

## 2017-07-14 MED ORDER — SODIUM CHLORIDE 0.9 % IV BOLUS (SEPSIS)
1000.0000 mL | Freq: Once | INTRAVENOUS | Status: AC
  Administered 2017-07-14: 1000 mL via INTRAVENOUS

## 2017-07-14 MED ORDER — LEVETIRACETAM 500 MG PO TABS
500.0000 mg | ORAL_TABLET | Freq: Two times a day (BID) | ORAL | Status: DC
  Administered 2017-07-14 – 2017-07-18 (×8): 500 mg via ORAL
  Filled 2017-07-14 (×8): qty 1

## 2017-07-14 MED ORDER — ACETAMINOPHEN 650 MG RE SUPP: 650.0000 mg | Freq: Four times a day (QID) | RECTAL | Status: DC | PRN

## 2017-07-14 MED ORDER — OXCARBAZEPINE 300 MG PO TABS
600.0000 mg | ORAL_TABLET | Freq: Two times a day (BID) | ORAL | Status: DC
  Administered 2017-07-14 – 2017-07-18 (×9): 600 mg via ORAL
  Filled 2017-07-14 (×10): qty 2

## 2017-07-14 MED ORDER — AMANTADINE HCL 100 MG PO CAPS
100.0000 mg | ORAL_CAPSULE | Freq: Two times a day (BID) | ORAL | Status: DC
  Administered 2017-07-14 – 2017-07-16 (×5): 100 mg via ORAL
  Filled 2017-07-14 (×5): qty 1

## 2017-07-14 MED ORDER — LEVETIRACETAM 500 MG PO TABS: 500.0000 mg | ORAL_TABLET | Freq: Two times a day (BID) | ORAL | 0 refills | Status: DC

## 2017-07-14 MED ORDER — QUETIAPINE FUMARATE 100 MG PO TABS
100.0000 mg | ORAL_TABLET | Freq: Two times a day (BID) | ORAL | Status: DC
  Administered 2017-07-14 – 2017-07-18 (×9): 100 mg via ORAL
  Filled 2017-07-14 (×9): qty 1

## 2017-07-14 MED ORDER — BROMOCRIPTINE MESYLATE 2.5 MG PO TABS
5.0000 mg | ORAL_TABLET | Freq: Two times a day (BID) | ORAL | Status: DC
  Administered 2017-07-14 – 2017-07-18 (×9): 5 mg via ORAL
  Filled 2017-07-14 (×11): qty 2

## 2017-07-14 MED ORDER — ACETAMINOPHEN 325 MG PO TABS: 650.0000 mg | ORAL_TABLET | Freq: Four times a day (QID) | ORAL | Status: DC | PRN

## 2017-07-14 NOTE — ED Notes (Signed)
Patient restless and agitated. Pt has tried getting out of bed. Patient redirected by sitter at bedside.

## 2017-07-14 NOTE — ED Notes (Signed)
ED TO INPATIENT HANDOFF REPORT  Name/Age/Gender Hector [name] 47 y.o. male  Code Status    Code Status Orders  (From admission, onward)        Start     Ordered   07/14/17 1404  Full code  Continuous     07/14/17 1405    Code Status History    Date Active Date Inactive Code Status Order ID Comments User Context   06/03/2017 2154 06/06/2017 1614 Full Code [id number]  Carlyle Dolly, MD Inpatient   05/24/2017 1451 05/25/2017 1710 Full Code [id number]  Francine Graven, DO ED   04/18/2017 0657 04/29/2017 1726 Full Code [id number]  Rise Patience, MD ED   02/25/2017 2023 02/28/2017 1838 Full Code [id number]  Florencia Reasons, MD Inpatient   12/30/2016 1439 01/01/2017 1941 Full Code [id number]  Rosita Fire, MD ED   06/01/2015 2351 06/02/2015 1601 Full Code [id number]  Gennaro Africa, MD ED   02/13/2015 0207 02/15/2015 1720 Full Code [id number]  Toy Baker, MD Inpatient   06/25/2014 2339 06/27/2014 1934 Full Code [id number]  Rise Patience, MD Inpatient   12/13/2013 0856 01/03/2014 1554 Full Code [id number]  Toy Baker, MD Inpatient   12/12/2013 0029 12/13/2013 0856 Full Code [id number]  [name], [name] ED      Home/SNF/Other Home  Chief Complaint Pysch Eval  Level of Care/Admitting Diagnosis ED Disposition    ED Disposition Condition Comment   [facility]: [facility] [[id number]]  Level of Care: Med-Surg [16]  Diagnosis: Altered mental status [780.97.ICD-9-CM]  Admitting Physician: Gerlean Ren [facility] [[id number]]  Attending Physician: Gerlean Ren [facility] [[id number]]  Estimated length of stay: past midnight tomorrow  Certification:: I certify this patient will need inpatient services for at least 2 midnights  PT Class (Do Not Modify): Inpatient [101]  PT Acc Code (Do Not Modify): Private [1]       Medical History Past Medical History:  Diagnosis Date  . Cirrhosis ([place])   . Depression   . DVT (deep venous thrombosis) ([facility])     UE, LE DVT during hospitalization  . GERD (gastroesophageal reflux disease)   . H/O ETOH abuse   . H/O renal calculi   . Seizure ([place])   . TBI (traumatic brain injury) ([place])    07/2013, fall from standing post seizure  . Tremor     Allergies Allergies  Allergen Reactions  . Ativan [Lorazepam] Other (See Comments)    Pt has paradoxical effect with sedating medications.   . Lactulose Hives    IV Location/Drains/Wounds Patient Lines/Drains/Airways Status   Active Line/Drains/Airways    Name:   Placement date:   Placement time:   Site:   Days:   Peripheral IV 07/13/17 Left Antecubital   07/13/17    2104    Antecubital   1   External Urinary Catheter   06/03/17    2153    -   41          Labs/Imaging Results for orders placed or performed during the hospital encounter of 07/13/17 (from the past 48 hour(s))  CBG monitoring, ED     Status: None   Collection Time: 07/14/17 12:14 AM  Result Value Ref Range   Glucose-Capillary 99 65 - 99 mg/dL  CBC with Differential/Platelet     Status: Abnormal   Collection Time: 07/14/17 12:15 AM  Result Value Ref Range   WBC 9.4 4.0 - 10.5 K/uL   RBC 3.54 (L) 4.22 - 5.81 MIL/uL  Hemoglobin 10.8 (L) 13.0 - 17.0 g/dL   HCT 33.6 (L) 39.0 - 52.0 %   MCV 94.9 78.0 - 100.0 fL   MCH 30.5 26.0 - 34.0 pg   MCHC 32.1 30.0 - 36.0 g/dL   RDW 13.2 11.5 - 15.5 %   Platelets 129 (L) 150 - 400 K/uL   Neutrophils Relative % 82 %   Neutro Abs 7.7 1.7 - 7.7 K/uL   Lymphocytes Relative 9 %   Lymphs Abs 0.8 0.7 - 4.0 K/uL   Monocytes Relative 9 %   Monocytes Absolute 0.8 0.1 - 1.0 K/uL   Eosinophils Relative 0 %   Eosinophils Absolute 0.0 0.0 - 0.7 K/uL   Basophils Relative 0 %   Basophils Absolute 0.0 0.0 - 0.1 K/uL    Comment: Performed at [facility], [place] [address] [place], [place] [address]  Comprehensive metabolic panel     Status: Abnormal   Collection Time: 07/14/17 12:15 AM  Result Value Ref Range   Sodium 146 (H) 135 -  145 mmol/L   Potassium 4.4 3.5 - 5.1 mmol/L   Chloride 107 101 - 111 mmol/L   CO2 28 22 - 32 mmol/L   Glucose, Bld 115 (H) 65 - 99 mg/dL   BUN 42 (H) 6 - 20 mg/dL   Creatinine, Ser 1.50 (H) 0.61 - 1.24 mg/dL   Calcium 10.1 8.9 - 10.3 mg/dL   Total Protein 8.4 (H) 6.5 - 8.1 g/dL   Albumin 4.5 3.5 - 5.0 g/dL   AST 49 (H) 15 - 41 U/L   ALT 34 17 - 63 U/L   Alkaline Phosphatase 172 (H) 38 - 126 U/L   Total Bilirubin 0.5 0.3 - 1.2 mg/dL   GFR calc non Af Amer 53 (L) >60 mL/min   GFR calc Af Amer >60 >60 mL/min    Comment: (NOTE) The eGFR has been calculated using the CKD EPI equation. This calculation has not been validated in all clinical situations. eGFR's persistently <60 mL/min signify possible Chronic Kidney Disease.    Anion gap 11 5 - 15    Comment: Performed at [facility], [place] [address] [place], [place] [address]  Ammonia     Status: None   Collection Time: 07/14/17 11:45 AM  Result Value Ref Range   Ammonia 15 9 - 35 umol/L    Comment: Performed at [facility], [place] [address] [place], [place] [address]   Ct Head Wo Contrast  Result Date: 07/14/2017 CLINICAL DATA:  Head trauma. Seizures and hallucinations with multiple falls. EXAM: CT HEAD WITHOUT CONTRAST TECHNIQUE: Contiguous axial images were obtained from the base of the skull through the vertex without intravenous contrast. COMPARISON:  Head CT 06/03/2017 FINDINGS: Brain: Moderate patient motion limits detailed assessment. Unchanged multifocal encephalomalacia throughout the left cerebral hemisphere with associated ex vacuo dilatation of the left lateral ventricle. Unchanged tracheal aside from prior exam. No evidence of acute hemorrhage. No midline shift. Vascular: No gross hyperdense vessel, motion limited evaluation. Skull: Prior left craniotomy. No evidence of acute abnormality allowing for motion. Sinuses/Orbits: Chronic opacification of right maxillary sinus. No acute finding.  Other: None. IMPRESSION: 1. Motion limited exam demonstrating no gross acute abnormality. 2. Chronic left cerebral encephalomalacia and ex vacuo dilatation of the left lateral ventricle, grossly stable allowing for motion. Electronically Signed   By: Jeb Levering M.D.   On: 07/14/2017 00:33   Dg Hand Complete Left  Result Date: 07/14/2017 CLINICAL DATA:  Fall EXAM: LEFT HAND - COMPLETE 3+ VIEW COMPARISON:  None. FINDINGS: Acute comminuted fracture involving the mid and distal aspect of the fifth proximal phalanx with ulnar and dorsal angulation of distal fracture fragment. No subluxation. No radiopaque foreign body. IMPRESSION: Acute comminuted and angulated fracture involving the fifth proximal phalanx Electronically Signed   By: Donavan Foil M.D.   On: 07/14/2017 00:16    Pending Labs Unresulted Labs (From admission, onward)   Start     Ordered   07/15/17 3295  Basic metabolic panel  Tomorrow morning,   R     07/14/17 1405   07/14/17 1402  CBC  (heparin)  Once,   R    Comments:  Baseline for heparin therapy IF NOT ALREADY DRAWN.  Notify MD if PLT < 100 K.    07/14/17 1405   07/14/17 1402  Creatinine, serum  (heparin)  Once,   R    Comments:  Baseline for heparin therapy IF NOT ALREADY DRAWN.    07/14/17 1405   07/14/17 1354  Vitamin B12  Once,   R     07/14/17 1354   07/14/17 1354  Folate RBC  Once,   R     07/14/17 1354   07/14/17 1354  Urine rapid drug screen (hosp performed)  Once,   R     07/14/17 1354   07/14/17 1352  Rapid urine drug screen (hospital performed)  STAT,   R     07/14/17 1351   07/14/17 1329  TSH  STAT,   R     07/14/17 1329   07/14/17 1048  Urinalysis, Routine w reflex microscopic  Once,   R     07/14/17 1047      Vitals/Pain Today's Vitals   07/14/17 0542 07/14/17 0720 07/14/17 1114 07/14/17 1624  BP: 96/76  105/67 105/68  Pulse: (!) 101  93 85  Resp: 18  18 18   Temp: (!) 97.5 F (36.4 C)   98.1 F (36.7 C)  TempSrc: Oral   Oral  SpO2: 95%   96%   PainSc:  0-No pain      Isolation Precautions No active isolations  Medications Medications  amantadine (SYMMETREL) capsule 100 mg (100 mg Oral Given 07/14/17 1131)  bromocriptine (PARLODEL) tablet 5 mg (5 mg Oral Given 07/14/17 1131)  clonazePAM (KLONOPIN) tablet 1 mg (has no administration in time range)  lacosamide (VIMPAT) tablet 150 mg (150 mg Oral Given 07/14/17 1129)  Oxcarbazepine (TRILEPTAL) tablet 600 mg (600 mg Oral Given 07/14/17 1128)  QUEtiapine (SEROQUEL) tablet 100 mg (100 mg Oral Given 07/14/17 1129)  heparin injection 5,000 Units (has no administration in time range)  acetaminophen (TYLENOL) tablet 650 mg (has no administration in time range)    Or  acetaminophen (TYLENOL) suppository 650 mg (has no administration in time range)  senna-docusate (Senokot-S) tablet 1 tablet (has no administration in time range)  levETIRAcetam (KEPPRA) tablet 500 mg (has no administration in time range)  levETIRAcetam (KEPPRA) tablet 500 mg (500 mg Oral Given 07/14/17 0645)  sodium chloride 0.9 % bolus 1,000 mL (0 mLs Intravenous Stopped 07/14/17 1337)  sodium chloride 0.9 % bolus 1,000 mL (1,000 mLs Intravenous New Bag/Given 07/14/17 1344)  sodium chloride 0.9 % bolus 1,000 mL (1,000 mLs Intravenous New Bag/Given 07/14/17 1625)    Mobility walks

## 2017-07-14 NOTE — Discharge Instructions (Addendum)
Start taking keppra as prescribed in addition on current medications. Follow up with family doctor or neurology   For your mental health needs, you are advised to follow up with [facility].  New and returning patients are seen at their walk-in clinic.  Walk-in hours are Monday - Friday from 8:00 am - 3:00 pm.  Walk-in patients are seen on a first come, first served basis.  Try to arrive as early as possible for he best chance of being seen the same day:       [facility]      [address]      [place], [place] [address]      [phone]

## 2017-07-14 NOTE — ED Notes (Signed)
Patient's father here to discharge the patient, but once talking with his son he came to the desk and stated, "He is no better than when I brought him in" Patient's father wanted to talk to someone about his discharge. EDPA notified. Patient's father stated, "I am not going to take him home as long as he is like he is."

## 2017-07-14 NOTE — ED Notes (Signed)
Transporter called for transportation

## 2017-07-14 NOTE — BH Assessment (Signed)
Tele Assessment Note   Patient Name: Hector [name] MRN: [id number] Referring Physician: Roxy Horseman, PA-C Location of Patient:  [facility] Location of Provider: Behavioral Health TTS Department  SHAHZAD THOMANN is an 47 y.o. male 47 yo male with history of TBI in 2015.  His family reports multiple seizures with falls prior to admission.  Noted hallucinations.  On assessment, he denies hallucinations along with suicidal/homicidal ideations.  Stable psychiatrically. Pt is non verbal due to a TBI that he sustained 4 years ago. Pt had a seizure in a convenience store and hit his head causing him to be in an induced coma for 3 months. Pt has not been able to speak since this event and his father has been taking care of his daily needs. Pt has a history of alcoholism but has not drank in over a year per Dad's report. Dad states that he does not have any psychiatric history prior to the TBI.  Dr. 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 Cirrhosis ([facility]CC)   . Depression   . DVT (deep venous thrombosis) ([facility])    UE, LE DVT during hospitalization  . GERD (gastroesophageal reflux disease)   . H/O ETOH abuse   . H/O renal calculi   . Seizure ([facility])   . TBI (traumatic brain injury) ([facility])    07/2013, fall from standing post seizure  . Tremor     Past Surgical History:  Procedure Laterality Date  . BRAIN SURGERY    . GASTROSTOMY TUBE PLACEMENT    . RADIOLOGY WITH ANESTHESIA N/A 04/20/2017   Procedure: MRI WITH ANESTHESIA OF THE BRAIN;  Surgeon: Radiologist, Medication, MD;  Location: [facility] OR;  Service: Radiology;  Laterality: N/A;  . REMOVAL OF GASTROSTOMY TUBE    . TRACHEOSTOMY     feinstein    Family History:  Family History  Problem Relation Age of Onset  . Hypercholesterolemia Mother   . Hypertension Mother   . Congestive Heart  Failure Brother        Deceased age 17    Social History:  reports that he quit smoking about 2 months ago. His smoking use included cigarettes. He has a 5.00 pack-year smoking history. He has never used smokeless tobacco. He reports that he drinks alcohol. He reports that he does not use drugs.  Additional Social History:  Alcohol / Drug Use Pain Medications: see MAR Prescriptions: see MAR Over the Counter: see MAR History of alcohol / drug use?: Yes Substance #1 Name of Substance 1: Alcohol  1 - Age of First Use: unknown 1 - Frequency: pt was a chronic alcoholic 1 - Last Use / Amount: 1 year ago   CIWA: CIWA-Ar BP: 96/76 Pulse Rate: (!) 101 COWS:    Allergies:  Allergies  Allergen Reactions  . Ativan [Lorazepam] Other (See Comments)    Pt has paradoxical effect with sedating medications.   . Lactulose Hives    Home Medications:  (Not in a hospital admission)  OB/GYN Status:  No LMP for male patient.  General Assessment Data Assessment unable to be completed: Yes Reason for not completing assessment: Clinician attempted to assess pt however pt did not speak in complete sentences and mostly made noises. Per Rob, [place] he called the pt's family several times to gather collateral to no avail.  Location of Assessment: [facility] ED  TTS Assessment: In system Is this a Tele or Face-to-Face Assessment?: Face-to-Face Is this an Initial Assessment or a Re-assessment for this encounter?: Initial Assessment Marital status: Single Maiden name: n/a Living Arrangements: Parent Can pt return to current living arrangement?: No Admission Status: Voluntary Is patient capable of signing voluntary admission?: No Referral Source: Self/Family/Friend Insurance type: Medicaid     Crisis Care Plan Living Arrangements: Parent Legal Guardian: Other:(possible father) Name of Psychiatrist: None- pt has neurologist Name of Therapist: None  Education Status Is patient currently in school?: No Is  the patient employed, unemployed or receiving disability?: Unemployed  Risk to self with the past 6 months Suicidal Ideation: No Has patient been a risk to self within the past 6 months prior to admission? : No Suicidal Intent: No Has patient had any suicidal intent within the past 6 months prior to admission? : No Is patient at risk for suicide?: No Suicidal Plan?: No Has patient had any suicidal plan within the past 6 months prior to admission? : No Access to Means: No What has been your use of drugs/alcohol within the last 12 months?: none Previous Attempts/Gestures: No How many times?: 0 Other Self Harm Risks: none Triggers for Past Attempts: None known Intentional Self Injurious Behavior: None Family Suicide History: No Recent stressful life event(s): Other (Comment) Persecutory voices/beliefs?: No Depression: Yes Depression Symptoms: Loss of interest in usual pleasures Substance abuse history and/or treatment for substance abuse?: No Suicide prevention information given to non-admitted patients: Not applicable  Risk to Others within the past 6 months Homicidal Ideation: No Does patient have any lifetime risk of violence toward others beyond the six months prior to admission? : No Thoughts of Harm to Others: No Current Homicidal Intent: No Current Homicidal Plan: No Access to Homicidal Means: No Identified Victim: none History of harm to others?: No Assessment of Violence: None Noted Violent Behavior Description: none Does patient have access to weapons?: No Criminal Charges Pending?: No Does patient have a court date: No Is patient on probation?: No  Psychosis Hallucinations: None noted Delusions: None noted  Mental Status Report Appearance/Hygiene: Disheveled Eye Contact: Fair Motor Activity: Unable to assess Speech: Incoherent Level of Consciousness: Alert Mood: Depressed Affect: Appropriate to circumstance Anxiety Level: Moderate Thought Processes:  Coherent Judgement: Unable to Assess Orientation: Person, Place, Time, Situation Obsessive Compulsive Thoughts/Behaviors: None  Cognitive Functioning Concentration: Normal Memory: Recent Intact, Remote Intact Is patient IDD: No Is patient DD?: No Insight: Unable to Assess Impulse Control: Unable to Assess Appetite: Poor Have you had any weight changes? : No Change Sleep: Decreased Vegetative Symptoms: Staying in bed  ADLScreening [facility] Assessment Services) Patient's cognitive ability adequate to safely complete daily activities?: Yes Patient able to express need for assistance with ADLs?: Yes Independently performs ADLs?: No  Prior Inpatient Therapy Prior Inpatient Therapy: No  Prior Outpatient Therapy Prior Outpatient Therapy: No Does patient have an ACCT team?: No Does patient have Intensive In-House Services?  : No Does patient have [facility] services? : No Does patient have P4CC services?: No  ADL Screening (condition at time of admission) Patient's cognitive ability adequate to safely complete daily activities?: Yes Is the patient deaf or have difficulty hearing?: No Does the patient have difficulty seeing, even when wearing glasses/contacts?: No Does the patient have difficulty concentrating, remembering, or making decisions?: No Patient able to express need for assistance with ADLs?: Yes Does the patient have difficulty dressing or bathing?: No Independently performs ADLs?: No  Abuse/Neglect Assessment (Assessment to be complete while patient is alone) Abuse/Neglect Assessment Can Be Completed: Yes Physical Abuse: Denies Verbal Abuse: Denies Sexual Abuse: Denies Exploitation of patient/patient's resources: Denies Self-Neglect: Denies     Merchant navy officerAdvance Directives (For Healthcare) Does Patient Have a Medical Advance Directive?: No Would patient like information on creating a medical advance directive?: No - Patient declined    Additional Information 1:1 In  Past 12 Months?: No CIRT Risk: No Elopement Risk: No Does patient have medical clearance?: Yes     Disposition:  Disposition Initial Assessment Completed for this Encounter: Yes Disposition of Patient: Discharge(Dr. Wyona AlmasNorman & Lord, DNP - discharge)   Dian Situelvondria Ryot Burrous 07/14/2017 10:10 AM

## 2017-07-14 NOTE — ED Notes (Signed)
Patient pulled his clothes off and was walking fast down the hall with a blanket wrapped around his middle. Patient escorted back to the hall C, patient threw blanket off and hopped into the bed.

## 2017-07-14 NOTE — ED Notes (Signed)
Patient removing clothes while in the hallway. While attempting to put his clothes back on, patient became combative and swing and hitting at 2 staff members.

## 2017-07-14 NOTE — ED Provider Notes (Signed)
Patient signed out to me at shift change.  Patient with a history of seizures, traumatic brain injury, psychiatric history, here with increased number of seizures and hallucinations.  Patient has been evaluated by a previous provider.  Neurology was consulted and recommending adding Keppra to his medications.  Patient was signed out pending psychiatric evaluation.  9:29 AM Patient was seen by psychiatry, cleared psychiatrically.  He is stable for discharge home.  Will provide with prescription for Keppra.    10:48 AM Father now at bedside.  Father states that patient is not acting normally, still tremulous, unable to walk, still delusional.  He states this is not normal for him.  Reviewed patient's results, patient does appear to be dry, will start IV fluids.  Will also get ammonia level given history of cirrhosis.  Discussed with Dr. Rubin PayorPickering.  Given acute altered mental status with new hallucinations and delusions, possibly will admit for further evaluation.  Father seems to think that medications are causing this.  Spoke with medicine, will admit.   1:53 PM Discussed with Dr. Wilford CornerArora with neurology, who is very familiar with pt. Apparently not much different from baseline, even though father is adamant that this is not anywhere close to pt's baseline. Asked to get tox screen. Drug screen added. Dr. Wilford CornerArora to talk to Dr. Nelson ChimesAmin about pt.   Vitals:   07/13/17 2112 07/14/17 0440 07/14/17 0542 07/14/17 1114  BP: 127/74 (!) 154/141 96/76 105/67  Pulse: (!) 109 (!) 102 (!) 101 93  Resp: 19 20 18 18   Temp: 98.3 F (36.8 C)  (!) 97.5 F (36.4 C)   TempSrc: Oral  Oral   SpO2: 98% 95% 95% 96%      Jaynie CrumbleKirichenko, Xan Ingraham, PA-C 07/15/17 [id number]    Benjiman CorePickering, Nathan, MD 07/15/17 [phone]

## 2017-07-14 NOTE — ED Notes (Signed)
A call placed to patient's father. Paint's father stated that he would be here shortly to take the patient home.

## 2017-07-14 NOTE — BH Assessment (Signed)
[facility] Assessment Progress Note  Per Jacqueline Norman, DO, this pt does not require psychiatric hospitalization at this time.  Pt is psychiatrically cleared.  Pt is to be advised to follow up with [facility] for his behavioral health needs.  This has been included in pt's discharge instructions.  Pt's nurse has been notified.  Hason Ofarrell, MA Triage Specialist [phone]     

## 2017-07-14 NOTE — ED Notes (Signed)
Unable to collect vitals on pt. Pt refused and became agitated. Reported to nurse.

## 2017-07-14 NOTE — [facility] Suicide Risk Assessment (Signed)
Suicide Risk Assessment  Discharge Assessment   [facility]   Principal Problem: Status post seizure Discharge Diagnoses:  Patient Active Problem List   Diagnosis Date Noted  . Status post seizure [Z86.69] 07/14/2017    Priority: High  . TBI (traumatic brain injury) (HCC) [S06.9X9A] 12/12/2013    Priority: High  . Agitation [R45.1]   . Aspiration pneumonia due to gastric secretions (HCC) [J69.0]   . Hypoxia [R09.02]   . Coarse tremors [G25.2] 04/18/2017  . ARF (acute renal failure) (HCC) [N17.9] 04/18/2017  . Hypokalemia [E87.6]   . Hypomagnesemia [E83.42]   . Physical deconditioning [R53.81]   . FTT (failure to thrive) in adult [R62.7] 02/25/2017  . Oropharyngeal dysphagia [R13.12] 01/01/2017  . Orthostatic hypotension [I95.1] 01/01/2017  . Failure to thrive in adult [R62.7] 12/31/2016  . Vomiting [R11.10]   . Seizures (HCC) [R56.9] 06/01/2015  . 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 Dehydration [E86.0]   . Encephalopathy acute [G93.40] 02/13/2015  . Altered mental status [R41.82]   . Ataxia [R27.0] 02/12/2015  . Localization-related symptomatic epilepsy and epileptic syndromes with complex partial seizures, not intractable, without status epilepticus (HCC) [G40.209] 08/16/2014  . Tobacco abuse [Z72.0] 08/16/2014  . Acute kidney injury superimposed on CKD (HCC) [N17.9, N18.9] 06/25/2014  . Dysphagia [R13.10] 06/25/2014  . Centrilobular emphysema (HCC) [J43.2] 01/27/2014  . Tracheostomy in place [facility](HCC) [Z93.0] 01/27/2014  . Encounter for family conference with patient present [Z71.89] 01/23/2014  . Dvt femoral (deep venous thrombosis) (HCC) [I82.419] 01/05/2014  . Protein-calorie malnutrition, severe (HCC) [E43] 01/05/2014  . Alcohol abuse [F10.10] 01/05/2014  . Acute respiratory failure with hypoxia (HCC) [J96.01] 12/20/2013  . Delirium due to general medical condition [F05] 12/13/2013  . HCAP (healthcare-associated  pneumonia) [J18.9] 12/13/2013  . SIRS (systemic inflammatory response syndrome) (HCC) [R65.10] 12/13/2013    Total Time spent with patient: 45 minutes  Musculoskeletal: Strength & Muscle Tone: decreased Gait & Station: did not witness Patient leans: N/A  Psychiatric Specialty Exam:   Blood pressure 96/76, pulse (!) 101, temperature (!) 97.5 F (36.4 C), temperature source Oral, resp. rate 18, SpO2 95 %.There is no height or weight on file to calculate BMI.  General Appearance: Casual  Eye Contact::  Good  Speech:  Slurred409  Volume:  Normal  Mood:  Euthymic  Affect:  Congruent  Thought Process:  Coherent and Descriptions of Associations: Intact  Orientation:  Other:  person and place  Thought Content:  WDL and Logical  Suicidal Thoughts:  No  Homicidal Thoughts:  No  Memory:  Immediate;   Fair Recent;   Fair Remote;   Fair  Judgement:  Good  Insight:  Good  Psychomotor Activity:  Decreased  Concentration:  Good  Recall:  Fair  Fund of Knowledge:Fair  Language: Good  Akathisia:  No  Handed:  Right  AIMS (if indicated):     Assets:  Housing Leisure Time Resilience Social Support  Sleep:     Cognition: WNL  ADL's:  Intact   Mental Status Per Nursing Assessment::   On Admission:   47 yo male with history of TBI in 2015.  His family reports multiple seizures with falls prior to admission.  Noted hallucinations.  On assessment, he denies hallucinations along with suicidal/homicidal ideations.  Stable psychiatrically.  Demographic Factors:  Male and Caucasian  Loss Factors: NA  Historical Factors: NA  Risk Reduction Factors:   Sense of responsibility to family, Living with another person, especially a  relative and Positive social support  Continued Clinical Symptoms:  None  Cognitive Features That Contribute To Risk:  None    Suicide Risk:  Minimal: No identifiable suicidal ideation.  Patients presenting with no risk factors but with morbid ruminations;  may be classified as minimal risk based on the severity of the depressive symptoms    Plan Of Care/Follow-up recommendations:  Activity:  as tolerated Diet:  heart healthy diet  Mayuri Staples, NP 07/14/2017, 9:23 AM

## 2017-07-14 NOTE — BHH Counselor (Signed)
Clinician discussed disposition with Irving BurtonEmily, RN (day-shift to assess and gather collateral). Clinician noted from GoodyearEmily, RN that she attempted to call pt's family to obtain collateral to no avail.   Redmond Pullingreylese D Adam Sanjuan, MS, [facility], [facility] Triage Specialist [phone]  (have day-shift see pt

## 2017-07-14 NOTE — Progress Notes (Signed)
47 yo male with history of TBI in 2015.  His family reports multiple seizures with falls prior to admission.  Noted hallucinations.  On assessment, he denies hallucinations along with suicidal/homicidal ideations.  Stable psychiatrically.  Nanine MeansJamison Rosealyn Little, PMHNP

## 2017-07-14 NOTE — H&P (Signed)
History and Physical    Hector [name] ZOX:[id number] DOB: 18-Dec-1970 DOA: 07/13/2017  PCP: Medicine, [facility] Patient coming from: Home  Chief Complaint: Altered mental status  HPI: Hector [name] is a 47 y.o. 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  Family is not at bedside and patient is unable to provide me with any meaningful history therefore history per records   In the ER provider. Apparently patient has not really been eating and drinking for the past several days at home due to poor desire and has  been brought to the ER couple times recently due to this and was sent home.  According to the father patient is able to remain calm at baseline and walks straight but recently he has not been able to do so.  At baseline he is a phasic. Initially in the ER there is concerns that his delusion, hallucination and change in mental status was secondary to increase in episodes of seizures as father also mention that he had some shaking episodes at home.  Neurology was consulted Dr. Amada JupiterKirkpatrick recommended starting patient on, Keppra 500 mg on top of his routine medications.  Despite of this patient remained encephalopathic in the ER and kept wandering around in the hallway, occasionally taking his clothes off, pulling his IV and not following all the commands.  CT of the head was negative.  No gross signs of infection.  He even fell in the ER once fracturing his left fifth phalanx which is why splint was placed.  Patient was seen by psychiatrist who cleared him as they did not seem this was psychiatry issue. Medical team was called to admit him.  I spoke with Dr. Wilford CornerArora from Neurology extensively over the phone as he is taking care of him in the past.  He has had  previous EEG workup which has been abnormal due to his underlying brain abnormality, he has been followed closely by outpatient neurology.  At this point there is nothing more to add from neurology standpoint besides adding Keppra 500 mg twice daily.  After reviewing his records it appears patient's BUN is quite elevated compared to his baseline less than 10.  This is a 2242 today concerning for uremic encephalopathy.  We will  admit him and hydrate him.  If this persists, neurology will be available for reconsult.   Review of Systems: As per HPI otherwise 10 point review of systems negative.  Review of Systems Difficult to obtain due to patient's mentation  Past Medical History:  Diagnosis Date  . Cirrhosis (HCC)   . Depression   . DVT (deep venous thrombosis) (HCC)    UE, LE DVT during hospitalization  . GERD (gastroesophageal reflux disease)   . H/O ETOH abuse   . H/O renal calculi   . Seizure (HCC)   . TBI (traumatic brain injury) (HCC)    07/2013, fall from standing post seizure  . Tremor     Past Surgical History:  Procedure Laterality Date  . BRAIN SURGERY    . GASTROSTOMY TUBE PLACEMENT    . RADIOLOGY WITH ANESTHESIA N/A 04/20/2017   Procedure: MRI WITH ANESTHESIA OF THE BRAIN;  Surgeon: Radiologist, Medication, MD;  Location: [facility] OR;  Service: Radiology;  Laterality: N/A;  . REMOVAL OF GASTROSTOMY TUBE    . TRACHEOSTOMY  feinstein     reports that he quit smoking about 2 months ago. His smoking use included cigarettes. He has a 5.00 pack-year smoking history. He has never used smokeless tobacco. He reports that he drinks alcohol. He reports that he does not use drugs.  Allergies  Allergen Reactions  . Ativan [Lorazepam] Other (See Comments)    Pt has paradoxical effect with sedating medications.   . Lactulose Hives    Family History  Problem Relation Age of Onset  . Hypercholesterolemia Mother   . Hypertension Mother   . Congestive Heart Failure Brother         Deceased age 45     Prior to Admission medications   Medication Sig Start Date End Date Taking? Authorizing Provider  amantadine (SYMMETREL) 100 MG capsule TAKE 100 mg CAPSULE BY MOUTH TWICE A DAY 01/06/17  Yes [provider]  bromocriptine (PARLODEL) 2.5 MG tablet Take 5 mg by mouth 2 (two) times daily.   Yes [provider]  clonazePAM (KLONOPIN) 1 MG tablet Take 1 mg by mouth at bedtime. 12/24/16  Yes [provider]  feeding supplement, ENSURE ENLIVE, (ENSURE ENLIVE) LIQD Take 237 mLs 3 (three) times daily between meals by mouth. 02/28/17  Yes Vassie Loll, MD  Lacosamide 150 MG TABS Take 1 tablet (150 mg total) by mouth 2 (two) times daily. 07/07/17 08/06/17 Yes Jaffe, Adam R, DO  ondansetron (ZOFRAN ODT) 8 MG disintegrating tablet Take 1 tablet (8 mg total) every 8 (eight) hours as needed by mouth for nausea or vomiting. 02/28/17  Yes Vassie Loll, MD  oxcarbazepine (TRILEPTAL) 600 MG tablet TAKE 1 TABLET BY MOUTH TWICE A DAY Patient taking differently: TAKE 600 mg TABLET BY MOUTH TWICE A DAY 05/03/17  Yes Jaffe, Adam R, DO  pantoprazole (PROTONIX) 40 MG tablet Take 40 mg by mouth daily.    Yes [provider]  polyethylene glycol (MIRALAX / GLYCOLAX) packet Take 17 g by mouth daily as needed for moderate constipation.   Yes [provider]  primidone (MYSOLINE) 50 MG tablet Take 2 tablets (100 mg total) by mouth at bedtime. 07/13/17  Yes Tat, Octaviano Batty, DO  QUEtiapine (SEROQUEL) 100 MG tablet 100 MG AT 10 AM, 100 MG AT 2 PM, 200 MG AT BEDTIME 06/15/17  Yes [provider]  doxycycline (VIBRAMYCIN) 100 MG capsule Take 1 capsule (100 mg total) by mouth 2 (two) times daily. One po bid x 7 days Patient not taking: Reported on 07/13/2017 07/01/17   Bethann Berkshire, MD  levETIRAcetam (KEPPRA) 500 MG tablet Take 1 tablet (500 mg total) by mouth 2 (two) times daily. 07/14/17   Roxy Horseman, PA-C    Physical Exam: Vitals:   07/13/17 2112 07/14/17  0440 07/14/17 0542 07/14/17 1114  BP: 127/74 (!) 154/141 96/76 105/67  Pulse: (!) 109 (!) 102 (!) 101 93  Resp: 19 20 18 18   Temp: 98.3 F (36.8 C)  (!) 97.5 F (36.4 C)   TempSrc: Oral  Oral   SpO2: 98% 95% 95% 96%      Constitutional: NAD, temporal wasting, chronically ill-appearing, disheveled Vitals:   07/13/17 2112 07/14/17 0440 07/14/17 0542 07/14/17 1114  BP: 127/74 (!) 154/141 96/76 105/67  Pulse: (!) 109 (!) 102 (!) 101 93  Resp: 19 20 18 18   Temp: 98.3 F (36.8 C)  (!) 97.5 F (36.4 C)   TempSrc: Oral  Oral   SpO2: 98% 95% 95% 96%   Eyes: PERRL, lids and conjunctivae normal ENMT:  Mucous membranes are DRY Posterior pharynx clear of any exudate or lesions.Normal dentition.  Neck: normal, supple, no masses, no thyromegaly Respiratory: clear to auscultation bilaterally, no wheezing, no crackles. Normal respiratory effort. No accessory muscle use.  Cardiovascular: Regular rate and rhythm, no murmurs / rubs / gallops. No extremity edema. 2+ pedal pulses. No carotid bruits.  Abdomen: no tenderness, no masses palpated. No hepatosplenomegaly. Bowel sounds positive.  Musculoskeletal: no clubbing / cyanosis. No joint deformity upper and lower extremities. Good ROM, no contractures. Normal muscle tone.  Skin: no rashes, lesions, ulcers. No induration Neurologic: Has chronic aphasia therefore difficulty obtaining any history from him.  Grossly he is moving all the extremities.  AAOx 1 (Able to nod yes when I said his name) Psychiatric: On and off having some hallucination and delusions    Labs on Admission: I have personally reviewed following labs and imaging studies  CBC: Recent Labs  Lab 07/11/17 1927 07/14/17 0015  WBC 7.6 9.4  NEUTROABS 5.6 7.7  HGB 11.2* 10.8*  HCT 36.1* 33.6*  MCV 96.3 94.9  PLT 150 129*   Basic Metabolic Panel: Recent Labs  Lab 07/11/17 1927 07/14/17 0015  NA 143 146*  K 4.8 4.4  CL 103 107  CO2 31 28  GLUCOSE 103* 115*  BUN 23* 42*    CREATININE 1.20 1.50*  CALCIUM 10.1 10.1   GFR: Estimated Creatinine Clearance: 44.5 mL/min (A) (by C-G formula based on SCr of 1.5 mg/dL (H)). Liver Function Tests: Recent Labs  Lab 07/11/17 1927 07/14/17 0015  AST 30 49*  ALT 22 34  ALKPHOS 150* 172*  BILITOT 0.3 0.5  PROT 8.1 8.4*  ALBUMIN 4.5 4.5   No results for input(s): LIPASE, AMYLASE in the last 168 hours. Recent Labs  Lab 07/14/17 1145  AMMONIA 15   Coagulation Profile: No results for input(s): INR, PROTIME in the last 168 hours. Cardiac Enzymes: No results for input(s): CKTOTAL, CKMB, CKMBINDEX, TROPONINI in the last 168 hours. BNP (last 3 results) No results for input(s): PROBNP in the last 8760 hours. HbA1C: No results for input(s): HGBA1C in the last 72 hours. CBG: Recent Labs  Lab 07/11/17 1919 07/14/17 0014  GLUCAP 110* 99   Lipid Profile: No results for input(s): CHOL, HDL, LDLCALC, TRIG, CHOLHDL, LDLDIRECT in the last 72 hours. Thyroid Function Tests: No results for input(s): TSH, T4TOTAL, FREET4, T3FREE, THYROIDAB in the last 72 hours. Anemia Panel: No results for input(s): VITAMINB12, FOLATE, FERRITIN, TIBC, IRON, RETICCTPCT in the last 72 hours. Urine analysis:    Component Value Date/Time   COLORURINE YELLOW 07/11/2017 2104   APPEARANCEUR CLEAR 07/11/2017 2104   LABSPEC 1.012 07/11/2017 2104   PHURINE 7.0 07/11/2017 2104   GLUCOSEU NEGATIVE 07/11/2017 2104   HGBUR NEGATIVE 07/11/2017 2104   BILIRUBINUR NEGATIVE 07/11/2017 2104   KETONESUR NEGATIVE 07/11/2017 2104   PROTEINUR NEGATIVE 07/11/2017 2104   UROBILINOGEN 1.0 02/12/2015 1912   NITRITE NEGATIVE 07/11/2017 2104   LEUKOCYTESUR NEGATIVE 07/11/2017 2104   Sepsis Labs: !!!!!!!!!!!!!!!!!!!!!!!!!!!!!!!!!!!!!!!!!!!! @LABRCNTIP (procalcitonin:4,lacticidven:4) )No results found for this or any previous visit (from the past 240 hour(s)).   Radiological Exams on Admission: Ct Head Wo Contrast  Result Date: 07/14/2017 CLINICAL DATA:   Head trauma. Seizures and hallucinations with multiple falls. EXAM: CT HEAD WITHOUT CONTRAST TECHNIQUE: Contiguous axial images were obtained from the base of the skull through the vertex without intravenous contrast. COMPARISON:  Head CT 06/03/2017 FINDINGS: Brain: Moderate patient motion limits detailed assessment. Unchanged multifocal encephalomalacia throughout the left cerebral  hemisphere with associated ex vacuo dilatation of the left lateral ventricle. Unchanged tracheal aside from prior exam. No evidence of acute hemorrhage. No midline shift. Vascular: No gross hyperdense vessel, motion limited evaluation. Skull: Prior left craniotomy. No evidence of acute abnormality allowing for motion. Sinuses/Orbits: Chronic opacification of right maxillary sinus. No acute finding. Other: None. IMPRESSION: 1. Motion limited exam demonstrating no gross acute abnormality. 2. Chronic left cerebral encephalomalacia and ex vacuo dilatation of the left lateral ventricle, grossly stable allowing for motion. Electronically Signed   By: Rubye Oaks M.D.   On: 07/14/2017 00:33   Dg Hand Complete Left  Result Date: 07/14/2017 CLINICAL DATA:  Fall EXAM: LEFT HAND - COMPLETE 3+ VIEW COMPARISON:  None. FINDINGS: Acute comminuted fracture involving the mid and distal aspect of the fifth proximal phalanx with ulnar and dorsal angulation of distal fracture fragment. No subluxation. No radiopaque foreign body. IMPRESSION: Acute comminuted and angulated fracture involving the fifth proximal phalanx Electronically Signed   By: Jasmine Pang M.D.   On: 07/14/2017 00:16      Assessment/Plan Principal Problem:   Altered mental status Active Problems:   TBI (traumatic brain injury) (HCC)   Delirium due to general medical condition   Protein-calorie malnutrition, severe (HCC)   Acute kidney injury superimposed on CKD (HCC)   Encephalopathy acute   Seizures (HCC)   Failure to thrive in adult   Coarse tremors    Agitation   Status post seizure   Altered mental status Acute metabolic encephalopathy likely uremic Mild To Moderate Dehydration, poor po intake -Admit the patient for closer monitoring and observation -Unlikely he is having any seizures per neurology.  Previously has been extensively worked up and his EEG has always been  abnormal.   -At this point he BUN appears to be quite elevated at 42 compared to his baseline less than 10.  His ammonia level has been 15.  No other clear signs of infection - Will give him 1 L normal saline IV bolus followed by 125 cc of normal saline for maintenance fluid.  Closely monitor his labs, if necessary change it to half normal saline.  Monitor for urine output -He has tendency to have his IV lines and become noncompliant therefore have advised nurses to place soft restraints on him in place sitter to allow [place] to medically treat him.  He is also fallen couple of times in the hallway without any head injury due to unsteady gait and wandering away. -CT of the head negative for acute pathology but shows chronic left encephalomalacia -We will check for UDS, B12 and folate  If encephalopathy does not resolve with uremia, reconsult neurology for assistance.Dr Wilford Corner aware, appreciate his input.   Mild renal insufficiency - Creatinine is 1.5 with BUN 42.  Baseline creatinine is 1.1 with BUN mostly less than 10 but  couple of days ago it was 23. -We will give him IV fluids and monitor urine output.  Comminuted fracture of the left fifth proximal phalanx -Patient had a fall in the ED while he was running around and ended up fracturing his fifth proximal phalanx.  Splint in place.  Pain control if necessary.  Seizures -He follows with outpatient neurology routinely.  Has been extensively worked up previously and appears that it has been getting difficult to control his seizures.  Unlikely he is having any this time.  Case was discussed by me with Dr. Mervyn Skeeters and by the ED  physician with Dr. Amada Jupiter overnight who recommended  continuing patient on home medications and adding 500 mg of Keppra twice daily -We will place him on seizure precautions Continue his other home medications bromocriptine, Seroquel, Trileptal, Lexapro, Vimpat, Klonopin  History of traumatic brain injury leading to aphasia and dementia from fall --Continue to provide supportive care.  Patient may end up requiring long-term care placement to receive around-the-clock care if family is not able to do this at home -Due to history of dysphagia will place him on dysphagia 3 diet per previous speech recommendations.  Severe protein calorie malnutrition - Encourage oral intake.  We can get dietitian consult if necessary  GERD -Continue PPI  History of alcoholic cirrhosis - Not using this currently.  Appears to be compensated.  Ammonia level is normal.   DVT prophylaxis: Hep SQ Code Status: Full  Family Communication: None at bedside, Father left earlier today  Disposition Plan:  TBD Consults called: Discussed case with Neurology, Dr Wilford Corner, If patient isnt better tomorrow with current intervention reconsult him.  Admission status: MedSurg   Daisee Centner Joline Maxcy MD [facility] Pager [phone]  If 7PM-7AM, please contact night-coverage [email or web address] Password [email or web address]  07/14/2017, 2:10 PM

## 2017-07-14 NOTE — BHH Counselor (Signed)
Clinician attempted to assess pt however pt did not speak in complete sentences and mostly made noises. Per Rob, [place] he called the pt's family several times to gather collateral to no avail. Rob, PA recommended to have day shift see the pt with hopes of speaking with the pt's family to provide collateral information.    Hector Pullingreylese D Janelys Glassner, MS, [facility], [facility] Triage Specialist [phone]

## 2017-07-15 LAB — BASIC METABOLIC PANEL
ANION GAP: 8 (ref 5–15)
BUN: 20 mg/dL (ref 6–20)
CALCIUM: 9.1 mg/dL (ref 8.9–10.3)
CO2: 25 mmol/L (ref 22–32)
Chloride: 115 mmol/L — ABNORMAL HIGH (ref 101–111)
Creatinine, Ser: 1.12 mg/dL (ref 0.61–1.24)
GFR calc Af Amer: >60 mL/min (ref >60)
GFR calc non Af Amer: >60 mL/min (ref >60)
GLUCOSE: 90 mg/dL (ref 65–99)
Potassium: 3.7 mmol/L (ref 3.5–5.1)
Sodium: 148 mmol/L — ABNORMAL HIGH (ref 135–145)

## 2017-07-15 LAB — GLUCOSE, CAPILLARY: Glucose-Capillary: 92 mg/dL (ref 65–99)

## 2017-07-15 MED ORDER — PRIMIDONE 50 MG PO TABS
100.0000 mg | ORAL_TABLET | Freq: Every day | ORAL | Status: DC
  Administered 2017-07-15 – 2017-07-17 (×3): 100 mg via ORAL
  Filled 2017-07-15 (×4): qty 2

## 2017-07-15 MED ORDER — PANTOPRAZOLE SODIUM 40 MG PO TBEC
40.0000 mg | DELAYED_RELEASE_TABLET | Freq: Every day | ORAL | Status: DC
  Administered 2017-07-15 – 2017-07-18 (×4): 40 mg via ORAL
  Filled 2017-07-15 (×4): qty 1

## 2017-07-15 MED ORDER — POLYETHYLENE GLYCOL 3350 17 G PO PACK: 17.0000 g | PACK | Freq: Every day | ORAL | Status: DC | PRN

## 2017-07-15 MED ORDER — THIAMINE HCL 100 MG/ML IJ SOLN
100.0000 mg | Freq: Every day | INTRAMUSCULAR | Status: DC
  Administered 2017-07-15 – 2017-07-16 (×2): 100 mg via INTRAVENOUS
  Filled 2017-07-15 (×3): qty 2

## 2017-07-15 NOTE — Progress Notes (Addendum)
[facility] PROGRESS NOTE  DAJOHN ELLENDER ZOX:[id number] DOB: [birth year] DOA: 07/13/2017  PCP: Medicine, [facility]  Brief History/Interval Summary: 47 y.o. 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  Patient lives with his father who mentioned that patient had few episodes of seizures at home and has been having hallucinations.  This was a clear change from his usual baseline.  Case was discussed with neurology who recommended initiating Keppra.  CT scan did not show any acute findings.  Patient was hospitalized for further management.  Reason for Visit: Acute metabolic encephalopathy  Consultants: Phone discussion with neurology  Procedures: None  Antibiotics: None  Subjective/Interval History: Patient opens his eyes.  Does not really communicate much.  ROS: Unable to do  Objective:  Vital Signs  Vitals:   07/14/17 1900 07/14/17 2050 07/15/17 0712 07/15/17 0850  BP:  113/67 (!) 122/53   Pulse:  85 80   Resp:  18 14   Temp:  98.5 F (36.9 C) 98.4 F (36.9 C)   TempSrc:  Oral Axillary   SpO2:  95% 97%   Weight: 49.2 kg (108 lb 7.5 oz)   49.1 kg (108 lb 3.9 oz)    Intake/Output Summary (Last 24 hours) at 07/15/2017 1140 Last data filed at 07/15/2017 1119 Gross per 24 hour  Intake 1600 ml  Output 1200 ml  Net 400 ml   Filed Weights   07/14/17 1900 07/15/17 0850  Weight: 49.2 kg (108 lb 7.5 oz) 49.1 kg (108 lb 3.9 oz)    General appearance: alert, distracted and no distress Head: Normocephalic, without obvious abnormality, atraumatic Resp: clear to auscultation bilaterally Cardio: regular rate and rhythm, S1, S2 normal, no murmur, click, rub or gallop GI: soft, non-tender; bowel sounds normal; no masses,  no organomegaly Extremities:  extremities normal, atraumatic, no cyanosis or edema Neurologic: Moving all his extremities.  Pupils equal reacting.  Lab Results:  Data Reviewed: I have personally reviewed following labs and imaging studies  CBC: Recent Labs  Lab 07/11/17 1927 07/14/17 0015 07/14/17 1657  WBC 7.6 9.4 6.4  NEUTROABS 5.6 7.7  --   HGB 11.2* 10.8* 9.7*  HCT 36.1* 33.6* 30.0*  MCV 96.3 94.9 95.5  PLT 150 129* 108*    Basic Metabolic Panel: Recent Labs  Lab 07/11/17 1927 07/14/17 0015 07/14/17 1657 07/15/17 0417  NA 143 146*  --  148*  K 4.8 4.4  --  3.7  CL 103 107  --  115*  CO2 31 28  --  25  GLUCOSE 103* 115*  --  90  BUN 23* 42*  --  20  CREATININE 1.20 1.50* 1.31* 1.12  CALCIUM 10.1 10.1  --  9.1    GFR: Estimated Creatinine Clearance: 56 mL/min (by C-G formula based on SCr of 1.12 mg/dL).  Liver Function Tests: Recent Labs  Lab 07/11/17 1927 07/14/17 0015  AST 30 49*  ALT 22 34  ALKPHOS 150* 172*  BILITOT 0.3 0.5  PROT 8.1 8.4*  ALBUMIN 4.5 4.5    Recent Labs  Lab 07/14/17 1145  AMMONIA 15    CBG: Recent Labs  Lab 07/11/17 1919 07/14/17 0014 07/15/17 0849  GLUCAP 110* 99 92    Thyroid Function Tests: Recent Labs    07/14/17 1657  TSH 0.578    Anemia Panel: Recent Labs  07/14/17 1657  VITAMINB12 409     Radiology Studies: Ct Head Wo Contrast  Result Date: 07/14/2017 CLINICAL DATA:  Head trauma. Seizures and hallucinations with multiple falls. EXAM: CT HEAD WITHOUT CONTRAST TECHNIQUE: Contiguous axial images were obtained from the base of the skull through the vertex without intravenous contrast. COMPARISON:  Head CT 06/03/2017 FINDINGS: Brain: Moderate patient motion limits detailed assessment. Unchanged multifocal encephalomalacia throughout the left cerebral hemisphere with associated ex vacuo dilatation of the left lateral ventricle. Unchanged tracheal aside from prior exam. No evidence of acute hemorrhage. No midline shift. Vascular: No  gross hyperdense vessel, motion limited evaluation. Skull: Prior left craniotomy. No evidence of acute abnormality allowing for motion. Sinuses/Orbits: Chronic opacification of right maxillary sinus. No acute finding. Other: None. IMPRESSION: 1. Motion limited exam demonstrating no gross acute abnormality. 2. Chronic left cerebral encephalomalacia and ex vacuo dilatation of the left lateral ventricle, grossly stable allowing for motion. Electronically Signed   By: Rubye Oaks M.D.   On: 07/14/2017 00:33   Dg Hand Complete Left  Result Date: 07/14/2017 CLINICAL DATA:  Fall EXAM: LEFT HAND - COMPLETE 3+ VIEW COMPARISON:  None. FINDINGS: Acute comminuted fracture involving the mid and distal aspect of the fifth proximal phalanx with ulnar and dorsal angulation of distal fracture fragment. No subluxation. No radiopaque foreign body. IMPRESSION: Acute comminuted and angulated fracture involving the fifth proximal phalanx Electronically Signed   By: Jasmine Pang M.D.   On: 07/14/2017 00:16     Medications:  Scheduled: . amantadine  100 mg Oral BID  . bromocriptine  5 mg Oral BID  . clonazePAM  1 mg Oral QHS  . heparin  5,000 Units Subcutaneous Q8H  . lacosamide  150 mg Oral BID  . levETIRAcetam  500 mg Oral BID  . oxcarbazepine  600 mg Oral BID  . pantoprazole  40 mg Oral Daily  . primidone  100 mg Oral QHS  . QUEtiapine  100 mg Oral BID  . thiamine injection  100 mg Intravenous Daily   Continuous: . sodium chloride 75 mL/hr at 07/15/17 0931   ZOX:[id number] **OR** acetaminophen, polyethylene glycol, senna-docusate  Assessment/Plan:  Principal Problem:   Altered mental status Active Problems:   TBI (traumatic brain injury) (HCC)   Delirium due to general medical condition   Protein-calorie malnutrition, severe (HCC)   Acute kidney injury superimposed on CKD (HCC)   Encephalopathy acute   Seizures (HCC)   Failure to thrive in adult   Coarse tremors   Agitation   Status post  seizure    Acute metabolic encephalopathy Thought to be multifactorial including secondary to seizure and uremia.  Patient was given Keppra after discussions with neurology.  Patient has had similar presentations previously and has been extensively worked up including EEGs which have never revealed any epileptiform activity.  His BUN was noted to be elevated at 42 yesterday.  Ammonia level was normal.  CT head did not show any acute findings.  Patient has been hydrated.  Mental status appears to be either about the same as yesterday or is perhaps slightly improved.  His father does not think that he is back to baseline yet.  Continue to monitor him for now.  If he does not show any improvement in the next 24 hours we will discuss again with neurology.  Continue Keppra for now.  B12 level 409.  TSH 0.578.  UA does not suggest UTI.  Urine drug screen positive only for barbiturates.  Initiate thiamine due  to his history of alcoholism.  Mild to moderate dehydration Likely secondary to poor oral intake.  IV fluids have been given.  Mild renal insufficiency, acute Improved with IV fluids.  Monitor urine output.  Comminuted fracture of the left fifth proximal phalanx This was as a result of her fall in the emergency department when he was running around.  Splint in place.  Pain control.  Seizure disorder Followed by outpatient neurology.  After discussions with neurology yesterday he was started on Keppra.  His other home medications are being continued including bromocriptine, Seroquel, Trileptal, Lexapro, Vimpat and Klonopin.  Also noted to be on primidone.  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  Severe protein calorie malnutrition Encourage oral intake.  History of GERD Continue PPI.  History of alcoholic cirrhosis Not consuming alcohol anymore currently.  LFTs unremarkable.  Ammonia level normal.   DVT Prophylaxis:  Subcutaneous heparin    Code Status: Full code Family Communication: No family at bedside Disposition Plan: Management as outlined above.  Await further improvement in mental status.    LOS: 1 day   Osvaldo ShipperGokul Ameira Alessandrini  [facility] Pager [phone] 07/15/2017, 11:40 AM  If 7PM-7AM, please contact night-coverage at [email or web address], password [email or web address]

## 2017-07-15 NOTE — Progress Notes (Signed)
Initial Nutrition Assessment  DOCUMENTATION CODES:   Severe malnutrition in context of chronic illness, Underweight  INTERVENTION:   - Magic cup TID with meals, each supplement provides 290 kcal and 9 grams of protein  NUTRITION DIAGNOSIS:   Severe Malnutrition related to chronic illness(dysphagia, history of TBI) as evidenced by severe fat depletion, severe muscle depletion.  GOAL:   Patient will meet greater than or equal to 90% of their needs  MONITOR:   PO intake, Supplement acceptance, Weight trends, Labs  REASON FOR ASSESSMENT:   Other (Comment)(low BMI)   ASSESSMENT:   47 year old male who presented to ED after having visual hallucinations, multiple seizures, and frequent falls. 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 Pt admitted for AMS.  Pt unable to respond to RD at time of visit. RD spoke with NT who states that pt's father visited earlier today. NT states that pt drank a few sips of orange juice from a spoon this morning but choked on eggs. NT tried to feed pt spoonfuls of broth at lunch, but pt was making gurgling noises and was not easily aroused.  RD unable to obtain detailed weight and diet history. Per weight history in chart, pt has experienced a 5.2% weight loss in 6 months which is not significant for timeframe.  Meal Completion: 0-25%  Medications reviewed and include: 40 mg Protonix daily, 100 mg thiamine daily  Labs reviewed: sodium 148, chloride 115, hemoglobin 9.7, HCT 30.0  NUTRITION - FOCUSED PHYSICAL EXAM:    Most Recent Value  Orbital Region  Moderate depletion  Upper Arm Region  Severe depletion  Thoracic and Lumbar Region  Severe depletion  Buccal Region  Moderate depletion  Temple Region  Moderate depletion  Clavicle Bone Region  Severe depletion  Clavicle and Acromion Bone Region  Severe depletion  Scapular Bone Region   Severe depletion  Dorsal Hand  Mild depletion  Patellar Region  Severe depletion  Anterior Thigh Region  Moderate depletion  Posterior Calf Region  Severe depletion  Edema (RD Assessment)  None  Hair  Reviewed  Eyes  Unable to assess  Mouth  Unable to assess  Skin  Reviewed  Nails  Reviewed       Diet Order:  Seizure precautions DIET DYS 3 Room service appropriate? Yes; Fluid consistency: Thin  EDUCATION NEEDS:   Not appropriate for education at this time  Skin:  Skin Assessment: Reviewed RN Assessment(medial abrasion on bridge of nose)  Last BM:  PTA  Height:   Ht Readings from Last 1 Encounters:  07/11/17 5\' 10"  (1.778 m)    Weight:   Wt Readings from Last 1 Encounters:  07/15/17 108 lb 3.9 oz (49.1 kg)    Ideal Body Weight:  75.5 kg  BMI:  Body mass index is 15.53 kg/m.  Estimated Nutritional Needs:   Kcal:  1500-1700 kcal/day  Protein:  60-75 grams/day  Fluid:  1.5-1.7 L/day    Earma ReadingKate Jablonski Gentry Seeber, MS, RD, LDN Pager: [phone] Weekend/After Hours: [phone]

## 2017-07-16 LAB — BASIC METABOLIC PANEL
Anion gap: 9 (ref 5–15)
BUN: 14 mg/dL (ref 6–20)
CALCIUM: 8.9 mg/dL (ref 8.9–10.3)
CHLORIDE: 114 mmol/L — AB (ref 101–111)
CO2: 24 mmol/L (ref 22–32)
Creatinine, Ser: 1.02 mg/dL (ref 0.61–1.24)
GFR calc Af Amer: >60 mL/min (ref >60)
GFR calc non Af Amer: >60 mL/min (ref >60)
GLUCOSE: 123 mg/dL — AB (ref 65–99)
POTASSIUM: 3.7 mmol/L (ref 3.5–5.1)
Sodium: 147 mmol/L — ABNORMAL HIGH (ref 135–145)

## 2017-07-16 LAB — CBC
HEMATOCRIT: 31.6 % — AB (ref 39.0–52.0)
HEMOGLOBIN: 9.9 g/dL — AB (ref 13.0–17.0)
MCH: 30.4 pg (ref 26.0–34.0)
MCHC: 31.3 g/dL (ref 30.0–36.0)
MCV: 96.9 fL (ref 78.0–100.0)
Platelets: 127 10*3/uL — ABNORMAL LOW (ref 150–400)
RBC: 3.26 MIL/uL — ABNORMAL LOW (ref 4.22–5.81)
RDW: 13.6 % (ref 11.5–15.5)
WBC: 6.7 10*3/uL (ref 4.0–10.5)

## 2017-07-16 LAB — GLUCOSE, CAPILLARY: Glucose-Capillary: 100 mg/dL — ABNORMAL HIGH (ref 65–99)

## 2017-07-16 MED ORDER — AMANTADINE HCL 50 MG/5ML PO SYRP
50.0000 mg | ORAL_SOLUTION | Freq: Two times a day (BID) | ORAL | Status: DC
  Administered 2017-07-16 – 2017-07-18 (×5): 50 mg via ORAL
  Filled 2017-07-16 (×8): qty 5

## 2017-07-16 MED ORDER — VITAMIN B-1 100 MG PO TABS
100.0000 mg | ORAL_TABLET | Freq: Every day | ORAL | Status: DC
  Administered 2017-07-17 – 2017-07-18 (×2): 100 mg via ORAL
  Filled 2017-07-16 (×2): qty 1

## 2017-07-16 NOTE — Plan of Care (Addendum)
Tele sitter placed - report given. Seizure precautions in place (Floor mats and side rail pads, bed in lowest position. IV wrapped, suction set up and O2 tubing) No ativan PRN order at this point d/t patient has intolerance (Dr. Eather ColasAware).

## 2017-07-16 NOTE — Evaluation (Signed)
Physical Therapy Evaluation Patient Details Name: Hector [name] MRN: [id number] DOB: 12-07-1970 Today's Date: 07/16/2017   History of Present Illness  47 y.o. 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 Pt currently with functional limitations due to the deficits listed below (see PT Problem List).  Pt will benefit from skilled PT to increase their independence and safety with mobility to allow discharge to the venue listed below.  Pt aroused from sleeping and able to sit EOB and performing standing with assist.  No family present to determine previous level of functioning.  Pt attempting to follow simple commands with increased time however fatigues quickly.     Follow Up Recommendations Supervision/Assistance - 24 hour(uncertain of baseline, may benefit from HHPT, defer to family)    Equipment Recommendations  None recommended by PT    Recommendations for Other Services       Precautions / Restrictions Precautions Precautions: Fall Precaution Comments: seizure      Mobility  Bed Mobility Overal bed mobility: Needs Assistance Bed Mobility: Supine to Sit;Sit to Supine     Supine to sit: Max assist;+2 for physical assistance Sit to supine: Mod assist;+2 for physical assistance   General bed mobility comments: assist for upper and lower body to get to EOB, pt able to assist better upon return to supine  Transfers Overall transfer level: Needs assistance Equipment used: 2 person hand held assist Transfers: Sit to/from Stand Sit to Stand: Min assist         General transfer comment: pt attempting to stand so provided HHA, min assist for steadying, pt able to perform small weight shifts while standing, assist to control  descent  Ambulation/Gait             General Gait Details: deferred ambulation for safety  Stairs            Wheelchair Mobility    Modified Rankin (Stroke Patients Only)       Balance Overall balance assessment: History of Falls(hx of falls in ED per chart review)                                           Pertinent Vitals/Pain Pain Assessment: Faces Faces Pain Scale: No hurt    Home Living Family/patient expects to be discharged to:: Private residence Living Arrangements: Parent               Additional Comments: no family present for info. From previous admission know that he was living with his father     Prior Function Level of Independence: Needs assistance   Gait / Transfers Assistance Needed: uncertain, pt denies ambulating prior to arrival  ADL's / Homemaking Assistance Needed: per RN, father assists with ADLs, needs assist for feeding        Hand Dominance        Extremity/Trunk Assessment        Lower Extremity Assessment Lower Extremity Assessment: Generalized weakness(diffuse muscle atrophy)    Cervical / Trunk Assessment Cervical / Trunk Assessment: Other exceptions Cervical / Trunk Exceptions: forward head posture  Communication   Communication: Expressive difficulties  Cognition Arousal/Alertness: Awake/alert Behavior During Therapy: Flat affect Overall Cognitive Status: No family/caregiver  present to determine baseline cognitive functioning                                 General Comments: follows simple commands with increased time      General Comments      Exercises     Assessment/Plan    PT Assessment Patient needs continued PT services  PT Problem List Decreased strength;Decreased balance;Decreased knowledge of use of DME;Decreased activity tolerance;Decreased cognition;Decreased mobility       PT Treatment Interventions DME instruction;Therapeutic activities;Therapeutic  exercise;Gait training;Patient/family education;Stair training;Balance training;Wheelchair mobility training;Functional mobility training    PT Goals (Current goals can be found in the Care Plan section)  Acute Rehab PT Goals PT Goal Formulation: Patient unable to participate in goal setting Time For Goal Achievement: 07/30/17 Potential to Achieve Goals: Fair    Frequency Min 2X/week   Barriers to discharge        Co-evaluation               AM-PAC PT "6 Clicks" Daily Activity  Outcome Measure Difficulty turning over in bed (including adjusting bedclothes, sheets and blankets)?: Unable Difficulty moving from lying on back to sitting on the side of the bed? : Unable Difficulty sitting down on and standing up from a chair with arms (e.g., wheelchair, bedside commode, etc,.)?: Unable Help needed moving to and from a bed to chair (including a wheelchair)?: A Lot Help needed walking in hospital room?: Total Help needed climbing 3-5 steps with a railing? : Total 6 Click Score: 7    End of Session   Activity Tolerance: Patient limited by fatigue Patient left: with call bell/phone within reach;in bed;with bed alarm set(telesitter) Nurse Communication: Mobility status PT Visit Diagnosis: Unsteadiness on feet (R26.81);Muscle weakness (generalized) (M62.81)    Time: [id number] PT Time Calculation (min) (ACUTE ONLY): 14 min   Charges:   PT Evaluation $PT Eval Low Complexity: 1 Low     PT G Codes:        Zenovia JarredKati Florenda Watt, PT, DPT 07/16/2017 Pager: [phone]  Maida SaleLEMYRE,KATHrine E 07/16/2017, 12:49 PM

## 2017-07-16 NOTE — Progress Notes (Addendum)
[facility] PROGRESS NOTE  Hector [name] ZOX:[id number] DOB: Apr 08, 1971 DOA: 07/13/2017  PCP: Medicine, [facility]  Brief History/Interval Summary: 47 y.o. 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  Patient lives with his father who mentioned that patient had few episodes of seizures at home and has been having hallucinations.  This was a clear change from his usual baseline.  Case was discussed with neurology who recommended initiating Keppra.  CT scan did not show any acute findings.  Patient was hospitalized for further management.  Reason for Visit: Acute metabolic encephalopathy  Consultants: Neurology  Procedures: None  Antibiotics: None  Subjective/Interval History: Patient noted to be much more animated this morning.  Seems to be responding to my questions although due to his aphasia he is unable to answer.  ROS: Unable to do  Objective:  Vital Signs  Vitals:   07/15/17 1456 07/15/17 2300 07/16/17 0710 07/16/17 1248  BP: 112/68 124/89 114/67 (!) 115/59  Pulse: 85 85 78 81  Resp: 16 16 16 16   Temp: 99.5 F (37.5 C) 99.3 F (37.4 C) 97.9 F (36.6 C) 98.1 F (36.7 C)  TempSrc: Oral Oral Oral Oral  SpO2: 96% 96% 97% 97%  Weight:  49.1 kg (108 lb 3.9 oz)    Height:  5\' 10"  (1.778 m)      Intake/Output Summary (Last 24 hours) at 07/16/2017 1325 Last data filed at 07/16/2017 1028 Gross per 24 hour  Intake 1140 ml  Output 950 ml  Net 190 ml   Filed Weights   07/14/17 1900 07/15/17 0850 07/15/17 2300  Weight: 49.2 kg (108 lb 7.5 oz) 49.1 kg (108 lb 3.9 oz) 49.1 kg (108 lb 3.9 oz)    General appearance: Appears to be more awake and alert this morning.  Remains distracted. Resp: Clear to auscultation bilaterally Cardio: S1-S2 is normal  regular.  No S3-S4.  No rubs murmurs or bruit GI: Abdomen is soft.  Nontender nondistended.  Bowel sounds are present.  No masses organomegaly Extremities: No edema Neurologic: No facial asymmetry.  Moving all his extremities.  Lab Results:  Data Reviewed: I have personally reviewed following labs and imaging studies  CBC: Recent Labs  Lab 07/11/17 1927 07/14/17 0015 07/14/17 1657 07/16/17 0349  WBC 7.6 9.4 6.4 6.7  NEUTROABS 5.6 7.7  --   --   HGB 11.2* 10.8* 9.7* 9.9*  HCT 36.1* 33.6* 30.0* 31.6*  MCV 96.3 94.9 95.5 96.9  PLT 150 129* 108* 127*    Basic Metabolic Panel: Recent Labs  Lab 07/11/17 1927 07/14/17 0015 07/14/17 1657 07/15/17 0417 07/16/17 0349  NA 143 146*  --  148* 147*  K 4.8 4.4  --  3.7 3.7  CL 103 107  --  115* 114*  CO2 31 28  --  25 24  GLUCOSE 103* 115*  --  90 123*  BUN 23* 42*  --  20 14  CREATININE 1.20 1.50* 1.31* 1.12 1.02  CALCIUM 10.1 10.1  --  9.1 8.9    GFR: Estimated Creatinine Clearance: 61.5 mL/min (by C-G formula based on SCr of 1.02 mg/dL).  Liver Function Tests: Recent Labs  Lab 07/11/17 1927 07/14/17 0015  AST 30 49*  ALT 22 34  ALKPHOS 150* 172*  BILITOT 0.3 0.5  PROT 8.1 8.4*  ALBUMIN 4.5 4.5  Recent Labs  Lab 07/14/17 1145  AMMONIA 15    CBG: Recent Labs  Lab 07/11/17 1919 07/14/17 0014 07/15/17 0849 07/16/17 0809  GLUCAP 110* 99 92 100*    Thyroid Function Tests: Recent Labs    07/14/17 1657  TSH 0.578    Anemia Panel: Recent Labs    07/14/17 1657  VITAMINB12 409     Radiology Studies: No results found.   Medications:  Scheduled: . amantadine  100 mg Oral BID  . bromocriptine  5 mg Oral BID  . clonazePAM  1 mg Oral QHS  . heparin  5,000 Units Subcutaneous Q8H  . lacosamide  150 mg Oral BID  . levETIRAcetam  500 mg Oral BID  . oxcarbazepine  600 mg Oral BID  . pantoprazole  40 mg Oral Daily  . primidone  100 mg Oral QHS  . QUEtiapine  100 mg Oral BID  . [START ON  07/17/2017] thiamine  100 mg Oral Daily   Continuous: . sodium chloride 75 mL/hr at 07/15/17 0931   ZOX:[id number]:acetaminophen **OR** acetaminophen, polyethylene glycol, senna-docusate  Assessment/Plan:  Principal Problem:   Altered mental status Active Problems:   TBI (traumatic brain injury) (HCC)   Delirium due to general medical condition   Protein-calorie malnutrition, severe (HCC)   Acute kidney injury superimposed on CKD (HCC)   Encephalopathy acute   Seizures (HCC)   Failure to thrive in adult   Coarse tremors   Agitation   Status post seizure    Acute metabolic encephalopathy Thought to be multifactorial including secondary to seizure and uremia.  Patient was charted on Keppra after discussions with neurology.  Patient has had similar presentations previously and has been extensively worked up including EEGs which have never revealed any epileptiform activity.  His BUN was noted to be elevated at 42 the time of admission.  Ammonia level was normal.  CT head did not show any acute findings.  Patient was hydrated and BUN has improved.  Patient could have had a prolonged postictal course.  Mental status appears to be slightly better today.  His father was at the bedside who feels that he is slightly better but still nowhere close to his baseline.  Discussed with neurology today who will evaluate patient.  Continue Keppra for now. B12 level 409.  TSH 0.578.  UA does not suggest UTI.  Urine drug screen positive for barbiturates.  Initiated thiamine due to his history of alcoholism.  Mild to moderate dehydration Improved with IV fluids.  Mild renal insufficiency, acute Improved with IV fluids.  Monitor urine output.  Comminuted fracture of the left fifth proximal phalanx This was as a result of fall in the emergency department when he was running around.  Splint in place.  Pain control.  Will discuss with hand surgery.  Discussed with Dr. Janee Mornhompson who will arrange outpatient  follow-up.  Seizure disorder Followed by outpatient neurology.  After discussions with neurology he was started on Keppra.  His other home medications are being continued including bromocriptine, Seroquel, Trileptal, Lexapro, Vimpat and Klonopin.  Also noted to be on primidone.  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  Severe protein calorie malnutrition Encourage oral intake.  History of GERD Continue PPI.  History of alcoholic cirrhosis Not consuming alcohol anymore currently.  LFTs unremarkable.  Ammonia level normal.   DVT Prophylaxis: Subcutaneous heparin    Code Status: Full code Family Communication: Discussed with his father. Disposition  Plan: Management as outlined above.  Await neurology input.  Await further improvement in mental status.    LOS: 2 days   Osvaldo Shipper  [facility] Pager [phone] 07/16/2017, 1:25 PM  If 7PM-7AM, please contact night-coverage at [email or web address], password [facility]

## 2017-07-16 NOTE — Consult Note (Signed)
ORTHOPAEDIC CONSULTATION HISTORY & PHYSICAL REQUESTING PHYSICIAN: Osvaldo ShipperKrishnan, Gokul, MD  Chief Complaint: L SF injury  HPI: Hector [name] is a 47 y.o. male with a history of TBI & seizure disorder and increasing unsteadiness of gait with falls.  He was admitted a couple days ago, and during his workup injury to the left small finger was noted.  X-rays have been obtained revealing proximal phalanx fracture of the small finger.  A aluminum splint is been applied in the ED.  Past Medical History:  Diagnosis Date  . Cirrhosis (HCC)   . Depression   . DVT (deep venous thrombosis) (HCC)    UE, LE DVT during hospitalization  . GERD (gastroesophageal reflux disease)   . H/O ETOH abuse   . H/O renal calculi   . Seizure (HCC)   . TBI (traumatic brain injury) (HCC)    07/2013, fall from standing post seizure  . Tremor    Past Surgical History:  Procedure Laterality Date  . BRAIN SURGERY    . GASTROSTOMY TUBE PLACEMENT    . RADIOLOGY WITH ANESTHESIA N/A 04/20/2017   Procedure: MRI WITH ANESTHESIA OF THE BRAIN;  Surgeon: Radiologist, Medication, MD;  Location: [facility] OR;  Service: Radiology;  Laterality: N/A;  . REMOVAL OF GASTROSTOMY TUBE    . TRACHEOSTOMY     feinstein   Social History   Socioeconomic History  . Marital status: Single    Spouse name: Not on file  . Number of children: Not on file  . Years of education: Not on file  . Highest education level: Not on file  Occupational History  . Not on file  Social Needs  . Financial resource strain: Not on file  . Food insecurity:    Worry: Not on file    Inability: Not on file  . Transportation needs:    Medical: Not on file    Non-medical: Not on file  Tobacco Use  . Smoking status: Former Smoker    Packs/day: 0.25    Years: 20.00    Pack years: 5.00    Types: Cigarettes    Last attempt to quit: 04/17/2017    Years since quitting: 0.2  . Smokeless tobacco: Never Used  Substance and Sexual Activity  . Alcohol use: Yes      Alcohol/week: 0.0 oz  . Drug use: No  . Sexual activity: Not on file  Lifestyle  . Physical activity:    Days per week: Not on file    Minutes per session: Not on file  . Stress: Not on file  Relationships  . Social connections:    Talks on phone: Not on file    Gets together: Not on file    Attends religious service: Not on file    Active member of club or organization: Not on file    Attends meetings of clubs or organizations: Not on file    Relationship status: Not on file  Other Topics Concern  . Not on file  Social History Narrative  . Not on file   Family History  Problem Relation Age of Onset  . Hypercholesterolemia Mother   . Hypertension Mother   . Congestive Heart Failure Brother        Deceased age 49   Allergies  Allergen Reactions  . Ativan [Lorazepam] Other (See Comments)    Pt has paradoxical effect with sedating medications.   . Lactulose Hives   Prior to Admission medications   Medication Sig Start Date End Date Taking?  Authorizing Provider  amantadine (SYMMETREL) 100 MG capsule TAKE 100 mg CAPSULE BY MOUTH TWICE A DAY 01/06/17  Yes [provider]  bromocriptine (PARLODEL) 2.5 MG tablet Take 5 mg by mouth 2 (two) times daily.   Yes [provider]  clonazePAM (KLONOPIN) 1 MG tablet Take 1 mg by mouth at bedtime. 12/24/16  Yes [provider]  feeding supplement, ENSURE ENLIVE, (ENSURE ENLIVE) LIQD Take 237 mLs 3 (three) times daily between meals by mouth. 02/28/17  Yes Vassie Loll, MD  Lacosamide 150 MG TABS Take 1 tablet (150 mg total) by mouth 2 (two) times daily. 07/07/17 08/06/17 Yes Jaffe, Adam R, DO  ondansetron (ZOFRAN ODT) 8 MG disintegrating tablet Take 1 tablet (8 mg total) every 8 (eight) hours as needed by mouth for nausea or vomiting. 02/28/17  Yes Vassie Loll, MD  oxcarbazepine (TRILEPTAL) 600 MG tablet TAKE 1 TABLET BY MOUTH TWICE A DAY Patient taking differently: TAKE 600 mg TABLET BY MOUTH TWICE A DAY 05/03/17   Yes Jaffe, Adam R, DO  pantoprazole (PROTONIX) 40 MG tablet Take 40 mg by mouth daily.    Yes [provider]  polyethylene glycol (MIRALAX / GLYCOLAX) packet Take 17 g by mouth daily as needed for moderate constipation.   Yes [provider]  primidone (MYSOLINE) 50 MG tablet Take 2 tablets (100 mg total) by mouth at bedtime. 07/13/17  Yes Tat, Octaviano Batty, DO  QUEtiapine (SEROQUEL) 100 MG tablet 100 MG AT 10 AM, 100 MG AT 2 PM, 200 MG AT BEDTIME 06/15/17  Yes [provider]  doxycycline (VIBRAMYCIN) 100 MG capsule Take 1 capsule (100 mg total) by mouth 2 (two) times daily. One po bid x 7 days Patient not taking: Reported on 07/13/2017 07/01/17   Bethann Berkshire, MD  levETIRAcetam (KEPPRA) 500 MG tablet Take 1 tablet (500 mg total) by mouth 2 (two) times daily. 07/14/17   Roxy Horseman, PA-C   No results found.  Positive ROS: All other systems have been reviewed and were otherwise negative with the exception of those mentioned in the HPI and as above.  Physical Exam: Vitals: Refer to EMR. Constitutional:  WD, WN, NAD HEENT:  NCAT, EOMI Neuro/Psych:  Alert & oriented to person, place, and time; appropriate mood & affect Lymphatic: No generalized extremity edema or lymphadenopathy Extremities / MSK:  The extremities are normal with respect to appearance, ranges of motion, joint stability, muscle strength/tone, sensation, & perfusion except as otherwise noted:  The left small finger is presently splinted with an aluminum splint, apparently applied by the ED.  Because of the splint and his ability to cooperate, critical discernment of malrotation is difficult.  It appears grossly well aligned.  Intact light touch sensibility in the radial and ulnar aspects of the digit.  Assessment: Closed left small finger proximal phalanx fracture, with underlying TBI/seizure disorder  Plan: I discussed these findings with him.  I will ask the orthopedic tech to replace the aluminum  splint with a more substantial ulnar gutter splint.  We will plan to have him back for follow-up this week in the office, where we can obtain new x-rays in the office, repeat his clinical exam at that time, and determine whether operative or nonoperative care would be the best approach considering the totality of his circumstance.  Cliffton Asters Janee Morn, MD      [facility] [address] [place], [place]  [address] Office: [phone] Mobile: [phone]  07/16/2017, 8:00  PM    

## 2017-07-16 NOTE — Plan of Care (Signed)
Patient's dad called and informed of possible DC, but Dr. Herold Harmsequesting his help to assess for patient baseline. Dad stated he would be in to see patient sometime early afternoon. Dad reports patient was able to walk without walker at home or assistance prior to this admission - Yet PT assessment today called for assistance with short distances and/or pivots.

## 2017-07-16 NOTE — Consult Note (Signed)
Neurology Consultation  Reason for Consult: AMS, sz, worsening gait Referring Physician: Dr. Rito EhrlichKrishnan  CC: Confusion, breakthrough seizure, worsening  History is obtained from: Chart, patient's father over the phone  HPI: Hector [name] is a 47 y.o. male past medical history of TBI with ensuing seizure disorder, a aphasia, hydrocephalus and encephalomalacia, dysphagia brought to the hospital on 07/14/2017 for change in mental status.  Father said that he had one small and one big seizure prior to presentation.  The seizures were not bad compared to what he has had in the past but were concerning enough for him to be brought to the emergency room.  Patient had been having poor p.o. intake prior to this presentation.  Father also reports that the patient was having trouble walking progressively over the past few weeks to months. He had had breakthrough seizures a few months ago as well for which his outpatient neurologist had made adjustments to his antiepileptics. Initially neurology was curb sided and Keppra 500 was added during this admission to his antiepileptics. Furthermore, was told that the patient was very altered, agitated, aggressive and inappropriate taking his clothes off pulling his IVs and not following commands for which he was admitted. Noncontrast CT of the head was negative for any acute process.  He was noted to have acute kidney injury and that was treated with his most recent renal function test be normal. He is much improved, not hallucinating or being aggressive at this time but his father's concern remains for his worsening gait.  Unclear if he has had urinary incontinence worsening.  ROS: ROS was performed and is negative except as noted in the HPI.    Past Medical History:  Diagnosis Date  . Cirrhosis (HCC)   . Depression   . DVT (deep venous thrombosis) (HCC)    UE, LE DVT during hospitalization  . GERD (gastroesophageal reflux disease)   . H/O ETOH abuse   . H/O  renal calculi   . Seizure (HCC)   . TBI (traumatic brain injury) (HCC)    07/2013, fall from standing post seizure  . Tremor     Family History  Problem Relation Age of Onset  . Hypercholesterolemia Mother   . Hypertension Mother   . Congestive Heart Failure Brother        Deceased age 49    Social History:   reports that he quit smoking about 2 months ago. His smoking use included cigarettes. He has a 5.00 pack-year smoking history. He has never used smokeless tobacco. He reports that he drinks alcohol. He reports that he does not use drugs.  Medications  Current Facility-Administered Medications:  .  0.9 %  sodium chloride infusion, , Intravenous, Continuous, Amin, Ankit Chirag, MD, Last Rate: 75 mL/hr at 07/15/17 0931 .  acetaminophen (TYLENOL) tablet 650 mg, 650 mg, Oral, Q6H PRN **OR** acetaminophen (TYLENOL) suppository 650 mg, 650 mg, Rectal, Q6H PRN, Amin, Ankit Chirag, MD .  amantadine (SYMMETREL) capsule 100 mg, 100 mg, Oral, BID, Charm RingsLord, Jamison Y, NP, 100 mg at 07/16/17 0929 .  bromocriptine (PARLODEL) tablet 5 mg, 5 mg, Oral, BID, Charm RingsLord, Jamison Y, NP, 5 mg at 07/16/17 0929 .  clonazePAM (KLONOPIN) tablet 1 mg, 1 mg, Oral, QHS, Lord, Jamison Y, NP, 1 mg at 07/15/17 2258 .  heparin injection 5,000 Units, 5,000 Units, Subcutaneous, Q8H, Amin, Ankit Chirag, MD, 5,000 Units at 07/16/17 0641 .  lacosamide (VIMPAT) tablet 150 mg, 150 mg, Oral, BID, Lord, Herminio HeadsJamison Y, NP, 150 mg  at 07/16/17 0934 .  levETIRAcetam (KEPPRA) tablet 500 mg, 500 mg, Oral, BID, Amin, Ankit Chirag, MD, 500 mg at 07/16/17 0929 .  Oxcarbazepine (TRILEPTAL) tablet 600 mg, 600 mg, Oral, BID, Charm Rings, NP, 600 mg at 07/16/17 0929 .  pantoprazole (PROTONIX) EC tablet 40 mg, 40 mg, Oral, Daily, Osvaldo Shipper, MD, 40 mg at 07/16/17 0929 .  polyethylene glycol (MIRALAX / GLYCOLAX) packet 17 g, 17 g, Oral, Daily PRN, Osvaldo Shipper, MD .  primidone (MYSOLINE) tablet 100 mg, 100 mg, Oral, QHS, Osvaldo Shipper,  MD, 100 mg at 07/15/17 2259 .  QUEtiapine (SEROQUEL) tablet 100 mg, 100 mg, Oral, BID, Charm Rings, NP, 100 mg at 07/16/17 0929 .  senna-docusate (Senokot-S) tablet 1 tablet, 1 tablet, Oral, QHS PRN, Dimple Nanas, MD .  Melene Muller ON 07/17/2017] thiamine (VITAMIN B-1) tablet 100 mg, 100 mg, Oral, Daily, Herby Abraham, RPH  Exam: Current vital signs: BP (!) 115/59 (BP Location: Right Arm)   Pulse 81   Temp 98.1 F (36.7 C) (Oral)   Resp 16   Ht 5\' 10"  (1.778 m)   Wt 49.1 kg (108 lb 3.9 oz)   SpO2 97%   BMI 15.53 kg/m  Vital signs in last 24 hours: Temp:  [97.9 F (36.6 C)-99.5 F (37.5 C)] 98.1 F (36.7 C) (03/23 1248) Pulse Rate:  [78-85] 81 (03/23 1248) Resp:  [16] 16 (03/23 1248) BP: (112-124)/(59-89) 115/59 (03/23 1248) SpO2:  [96 %-97 %] 97 % (03/23 1248) Weight:  [49.1 kg (108 lb 3.9 oz)] 49.1 kg (108 lb 3.9 oz) (03/22 2300) General: Sleeping, wakes to voice, no acute distress HEENT: Normocephalic, atraumatic Cardiovascular: S1-S2 heard regular rate rhythm Lungs clear to auscultation Abdomen nondistended nontender Neurological exam Patient is awake, alert, oriented to self.  Severely dysarthric.  Was able to mild his name with great difficulty. I have known this patient in the past and he was able to at times follow commands inconsistently.  Today he did not follow any verbal commands but was able to mimic. He has severe expressive aphasia and severe dysarthria with Cranial nerves: Pupils equal round reactive to light, extra ocular movements intact visual fields full to threat bilaterally, right-sided lower facial drooping which is baseline Motor exam: Mildly increased extremity in all fours with the most amount of tone in the right upper extremity.  There is a large acute tremor in all 4 extremities.  All extremities are antigravity. Coordination: Dysmetric with finger-nose-finger and bradykinetic in both hands. Gait exam was deferred at this time.  Labs I have  reviewed labs in epic and the results pertinent to this consultation are:  CBC    Component Value Date/Time   WBC 6.7 07/16/2017 0349   RBC 3.26 (L) 07/16/2017 0349   HGB 9.9 (L) 07/16/2017 0349   HCT 31.6 (L) 07/16/2017 0349   PLT 127 (L) 07/16/2017 0349   MCV 96.9 07/16/2017 0349   MCH 30.4 07/16/2017 0349   MCHC 31.3 07/16/2017 0349   RDW 13.6 07/16/2017 0349   LYMPHSABS 0.8 07/14/2017 0015   MONOABS 0.8 07/14/2017 0015   EOSABS 0.0 07/14/2017 0015   BASOSABS 0.0 07/14/2017 0015   CMP     Component Value Date/Time   NA 147 (H) 07/16/2017 0349   K 3.7 07/16/2017 0349   CL 114 (H) 07/16/2017 0349   CO2 24 07/16/2017 0349   GLUCOSE 123 (H) 07/16/2017 0349   BUN 14 07/16/2017 0349   CREATININE 1.02 07/16/2017 0349  CALCIUM 8.9 07/16/2017 0349   PROT 8.4 (H) 07/14/2017 0015   ALBUMIN 4.5 07/14/2017 0015   AST 49 (H) 07/14/2017 0015   ALT 34 07/14/2017 0015   ALKPHOS 172 (H) 07/14/2017 0015   BILITOT 0.5 07/14/2017 0015   GFRNONAA >60 07/16/2017 0349   GFRAA >60 07/16/2017 0349   Imaging I have reviewed the images obtained:  CT-scan of the brain -no acute changes noted.  Chronic left cerebral encephalomalacia and ex vacuo dilatation of the left lateral ventricle grossly appears unchanged from the last exam-this exam was severely motion riddled.  Assessment and recs: 47 year old man past history of TBI with following seizure disorder, aphasia, ex vacuo dilatation of the ventricle on the left side, brought in for breakthrough seizure and altered mental status. His clinical exam is unchanged from before-she is a known patient to our service. He is currently on Klonopin, Vimpat, Keppra and oxcarbazepine as well as primidone for seizures and tremors. He is also on amantadine and bromocriptine as well as Seroquel. I have not seen him multiple times in the past few months, and his father complaining of gradual deterioration of his gait, I suspect that there might be  progression of his hydrocephalus that might be contributing to his gait along with side effects from medications giving him parkinsonian symptoms also contributing to gait abnormalities.  Recs: At this point, I would not recommend many acute medicine changes to be made. I would recommend trying to reduce or even discontinue amantadine, which I think is being used in his case is being used, in his case for TBI.  Amantadine has been shown to be useful in early stages of TBI to improve cognition but I do not think there is much role of amantadine in later stages of TBI.  Furthermore it might be contributing to impulse control disorders which he seems to exhibit from time to time.  I would recommend reducing it to 50 twice daily from 100 twice daily at this time. He should follow-up with Dr. Everlena Cooper and changes his medications to be made at that time. I suspect that his current presentation was in the setting of acute renal injury. I am also not sure if he is fully compliant with his medications and that might have contributed to breakthrough seizures along with the systemic illness making him more susceptible for breakthrough seizures.   -- Milon Dikes, MD [facility] Pager: [phone] If 7pm to 7am, please call on call as listed on AMION.

## 2017-07-17 LAB — GLUCOSE, CAPILLARY: Glucose-Capillary: 86 mg/dL (ref 65–99)

## 2017-07-17 NOTE — Progress Notes (Signed)
[facility] PROGRESS NOTE  Hector NianRobert W [name] [id number] DOB: Jul 04, 1970 DOA: 07/13/2017  PCP: [facility], [facility]  Brief History/Interval Summary: 47 y.o. 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  Patient lives with his father who mentioned that patient had few episodes of seizures at home and has been having hallucinations.  This was a clear change from his usual baseline.  Case was discussed with neurology who recommended initiating Keppra.  CT scan did not show any acute findings.  Patient was hospitalized for further management.  Reason for Visit: Acute metabolic encephalopathy  Consultants: Neurology.  Hand surgeon: Dr. Janee Mornhompson  Procedures: None  Antibiotics: None  Subjective/Interval History: Patient is awake alert.  He has aphasia at baseline.  States that he wants to go home.  Patient seen again with his father.  Father also feels patient has improved.  Patient has not ambulated yet.  ROS: Unable to do  Objective:  Vital Signs  Vitals:   07/16/17 0710 07/16/17 1248 07/16/17 2101 07/17/17 0442  BP: 114/67 (!) 115/59 121/71 107/64  Pulse: 78 81 69 67  Resp: 16 16 20 20   Temp: 97.9 F (36.6 C) 98.1 F (36.7 C) 99.5 F (37.5 C) 97.8 F (36.6 C)  TempSrc: Oral Oral Oral Axillary  SpO2: 97% 97% 99% 97%  Weight:      Height:        Intake/Output Summary (Last 24 hours) at 07/17/2017 1306 Last data filed at 07/17/2017 0943 Gross per 24 hour  Intake 1580 ml  Output 1400 ml  Net 180 ml   Filed Weights   07/14/17 1900 07/15/17 0850 07/15/17 2300  Weight: 49.2 kg (108 lb 7.5 oz) 49.1 kg (108 lb 3.9 oz) 49.1 kg (108 lb 3.9 oz)    General appearance: Much more awake and alert this morning. Resp: Clear to auscultation  bilaterally Cardio: S1-S2 is normal regular.  No S3-S4.  No rubs murmurs or bruit GI: Abdomen is soft.  Nontender nondistended Neurologic: Much more awake and alert.  No obvious focal neurological deficits.  Lab Results:  Data Reviewed: I have personally reviewed following labs and imaging studies  CBC: Recent Labs  Lab 07/11/17 1927 07/14/17 0015 07/14/17 1657 07/16/17 0349  WBC 7.6 9.4 6.4 6.7  NEUTROABS 5.6 7.7  --   --   HGB 11.2* 10.8* 9.7* 9.9*  HCT 36.1* 33.6* 30.0* 31.6*  MCV 96.3 94.9 95.5 96.9  PLT 150 129* 108* 127*    Basic Metabolic Panel: Recent Labs  Lab 07/11/17 1927 07/14/17 0015 07/14/17 1657 07/15/17 0417 07/16/17 0349  NA 143 146*  --  148* 147*  K 4.8 4.4  --  3.7 3.7  CL 103 107  --  115* 114*  CO2 31 28  --  25 24  GLUCOSE 103* 115*  --  90 123*  BUN 23* 42*  --  20 14  CREATININE 1.20 1.50* 1.31* 1.12 1.02  CALCIUM 10.1 10.1  --  9.1 8.9    GFR: Estimated Creatinine Clearance: 61.5 mL/min (by C-G formula based on SCr of 1.02 mg/dL).  Liver Function Tests: Recent Labs  Lab 07/11/17 1927 07/14/17 0015  AST 30 49*  ALT 22 34  ALKPHOS 150* 172*  BILITOT 0.3 0.5  PROT 8.1 8.4*  ALBUMIN 4.5 4.5    Recent Labs  Lab 07/14/17  1145  AMMONIA 15    CBG: Recent Labs  Lab 07/11/17 1919 07/14/17 0014 07/15/17 0849 07/16/17 0809 07/17/17 0736  GLUCAP 110* 99 92 100* 86    Thyroid Function Tests: Recent Labs    07/14/17 1657  TSH 0.578    Anemia Panel: Recent Labs    07/14/17 1657  VITAMINB12 409     Radiology Studies: No results found.   Medications:  Scheduled: . amantadine  50 mg Oral BID  . bromocriptine  5 mg Oral BID  . clonazePAM  1 mg Oral QHS  . heparin  5,000 Units Subcutaneous Q8H  . lacosamide  150 mg Oral BID  . levETIRAcetam  500 mg Oral BID  . oxcarbazepine  600 mg Oral BID  . pantoprazole  40 mg Oral Daily  . primidone  100 mg Oral QHS  . QUEtiapine  100 mg Oral BID  . thiamine  100 mg  Oral Daily   Continuous: . sodium chloride 50 mL/hr at 07/17/17 1610   RUE:[id number] **OR** acetaminophen, polyethylene glycol, senna-docusate  Assessment/Plan:  Principal Problem:   Altered mental status Active Problems:   TBI (traumatic brain injury) (HCC)   Delirium due to general medical condition   Protein-calorie malnutrition, severe (HCC)   Acute kidney injury superimposed on CKD (HCC)   Encephalopathy acute   Seizures (HCC)   Failure to thrive in adult   Coarse tremors   Agitation   Status post seizure    Acute metabolic encephalopathy Thought to be multifactorial including secondary to seizure and uremia.  Patient was started on Keppra after discussions with neurology.  Patient has had similar presentations previously and has been extensively worked up including EEGs which have never revealed any epileptiform activity.  His BUN was noted to be elevated at 42 the time of admission.  Ammonia level was normal.  CT head did not show any acute findings.  Patient was hydrated and BUN has improved.  Patient could have had a prolonged postictal course.  Mental status has significantly improved.  Patient seen by neurology and they recommend reducing the dose of amantadine.  They also recommend continued follow-up with his outpatient neurologist.  Father also feels patient has improved but would like for the patient to be ambulatory before taking him back home.  B12 level 409.  TSH 0.578.  UA did not suggest UTI.  Urine drug screen positive for barbiturates.  Continue thiamine due to his history of alcoholism.  Mild to moderate dehydration Improved with IV fluids.  Mild renal insufficiency, acute Improved with IV fluids.  Monitor urine output.  Comminuted fracture of the left fifth proximal phalanx This was as a result of fall in the emergency department when he was running around.  Splint in place.  Pain control.  Seen by Dr. Janee Morn with hand surgery.  Appreciate his  assistance.  He will follow-up the patient in his office this week to decide further course of action.  Seizure disorder Followed by outpatient neurology.  After discussions with neurology he was started on Keppra.  His other home medications are being continued including bromocriptine, Seroquel, Trileptal, Lexapro, Vimpat and Klonopin.  Also noted to be on primidone.  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  Severe protein calorie malnutrition Encourage oral intake.  History of GERD Continue PPI.  History of alcoholic cirrhosis Not consuming alcohol anymore currently.  LFTs unremarkable.  Ammonia level normal.   DVT Prophylaxis: Subcutaneous  heparin    Code Status: Full code Family Communication: Discussed with his father. Disposition Plan: Patient has improved.  Will request PT to reevaluate to make sure patient is ambulatory.  Hopefully discharge in the next 24 hours.    LOS: 3 days   Osvaldo Shipper  [facility] Pager [phone] 07/17/2017, 1:06 PM  If 7PM-7AM, please contact night-coverage at [email or web address], password [facility]

## 2017-07-18 LAB — GLUCOSE, CAPILLARY: GLUCOSE-CAPILLARY: 89 mg/dL (ref 65–99)

## 2017-07-18 LAB — FOLATE RBC
Folate, Hemolysate: 343.4 ng/mL
Folate, RBC: UNDETERMINED ng/mL

## 2017-07-18 LAB — HEMATOLOGY COMMENTS:

## 2017-07-18 MED ORDER — LEVETIRACETAM 500 MG PO TABS: 500.0000 mg | ORAL_TABLET | Freq: Two times a day (BID) | ORAL | 0 refills | Status: DC

## 2017-07-18 MED ORDER — THIAMINE HCL 100 MG PO TABS: 100.0000 mg | ORAL_TABLET | Freq: Every day | ORAL | 2 refills | Status: DC

## 2017-07-18 MED ORDER — AMANTADINE HCL 50 MG/5ML PO SYRP: 50.0000 mg | ORAL_SOLUTION | Freq: Two times a day (BID) | ORAL | 0 refills | Status: DC

## 2017-07-18 NOTE — Discharge Summary (Signed)
[facility]  Physician Discharge Summary   Patient ID: JAMARIOUS FEBO MRN: [id number] DOB/AGE: 11/02/1970 47 y.o.  Admit date: 07/13/2017 Discharge date: 07/18/2017  PCP: Medicine, [facility]  DISCHARGE DIAGNOSES:  Principal Problem:   Altered mental status Active Problems:   TBI (traumatic brain injury) (HCC)   Delirium due to general medical condition   Protein-calorie malnutrition, severe (HCC)   Acute kidney injury superimposed on CKD (HCC)   Encephalopathy acute   Seizures (HCC)   Failure to thrive in adult   Coarse tremors   Agitation   Status post seizure   RECOMMENDATIONS FOR OUTPATIENT FOLLOW UP: 1. Patient to follow-up with Dr. Everlena Cooper to discuss amantadine dosage. 2. Dr. Janee Morn with orthopedics will schedule appointment for patient's proximal phalanx fracture.   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  Patient lives with his father who mentioned that patient had few episodes of seizures at home and has been having hallucinations.  This was a clear change from his usual baseline.  Case was discussed with neurology who recommended initiating Keppra.  CT scan did not show any acute findings.  Patient was hospitalized for further management.  Consultants: Neurology.  Hand surgeon: Dr. Denton Brick COURSE:   Acute metabolic encephalopathy Thought to be multifactorial including secondary to seizure and uremia.  Patient was started on Keppra after discussions with neurology.  Patient has had similar  presentations previously and has been extensively worked up including EEGs which have never revealed any epileptiform activity.  His BUN was noted to be elevated at 42 the time of admission.  Ammonia level was normal.  CT head did not show any acute findings.  Patient was hydrated and BUN has improved. Patient could have had a prolonged postictal course.  Mental status has significantly improved.  Patient seen by neurology and they recommend reducing the dose of amantadine.  They also recommend continued follow-up with his outpatient neurologist. B12 level 409.  TSH 0.578.  UA did not suggest UTI.  Urine drug screen positive for barbiturates.  Continue thiamine due to his previous history of alcoholism.  Ambulated today with physical therapy.  Okay for discharge home.  Discussed with his father.  Mild to moderate dehydration Improved with IV fluids.  Mild renal insufficiency, acute Improved with IV fluids.  Comminuted fracture of the left hand fifth proximal phalanx This was as a result of fall in the emergency department when he was running around.  Splint in place.  Pain control. Seen by Dr. Janee Morn with hand surgery.  Appreciate his assistance.  He will follow-up the patient in his office this week to decide further course of action.  Seizure disorder Followed by outpatient neurology.  After discussions with neurology he was started on Keppra.  His other home medications are being continued including bromocriptine, Seroquel, Trileptal, Lexapro, Vimpat and Klonopin.  Also noted to be on primidone.  Neurology recommended decreasing the dose of his amantadine to 50 mg twice a day.  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  Severe protein calorie malnutrition Encourage oral intake.  History of GERD Continue PPI.  History of alcoholic cirrhosis Not consuming alcohol anymore currently.  LFTs unremarkable.  Ammonia level  normal.  Overall stable.  Okay for discharge home today.    PERTINENT LABS:  The results of significant diagnostics from this hospitalization (including imaging, microbiology, ancillary and laboratory) are listed below for reference.      Labs: Basic Metabolic Panel: Recent Labs  Lab 07/11/17 1927 07/14/17 0015 07/14/17 1657 07/15/17 0417 07/16/17 0349  NA 143 146*  --  148* 147*  K 4.8 4.4  --  3.7 3.7  CL 103 107  --  115* 114*  CO2 31 28  --  25 24  GLUCOSE 103* 115*  --  90 123*  BUN 23* 42*  --  20 14  CREATININE 1.20 1.50* 1.31* 1.12 1.02  CALCIUM 10.1 10.1  --  9.1 8.9   Liver Function Tests: Recent Labs  Lab 07/11/17 1927 07/14/17 0015  AST 30 49*  ALT 22 34  ALKPHOS 150* 172*  BILITOT 0.3 0.5  PROT 8.1 8.4*  ALBUMIN 4.5 4.5    Recent Labs  Lab 07/14/17 1145  AMMONIA 15   CBC: Recent Labs  Lab 07/11/17 1927 07/14/17 0015 07/14/17 1657 07/16/17 0349  WBC 7.6 9.4 6.4 6.7  NEUTROABS 5.6 7.7  --   --   HGB 11.2* 10.8* 9.7* 9.9*  HCT 36.1* 33.6* 30.0*  NOLAV 31.6*  MCV 96.3 94.9 95.5 96.9  PLT 150 129* 108* 127*    CBG: Recent Labs  Lab 07/14/17 0014 07/15/17 0849 07/16/17 0809 07/17/17 0736 07/18/17 0802  GLUCAP 99 92 100* 86 89     IMAGING STUDIES Ct Abdomen Pelvis Wo Contrast  Result Date: 07/01/2017 CLINICAL DATA:  47 y/o M; history of traumatic brain injury and nonverbal. Patient not eating or taking medications. EXAM: CT ABDOMEN AND PELVIS WITHOUT CONTRAST TECHNIQUE: Multidetector CT imaging of the abdomen and pelvis was performed following the standard protocol without IV contrast. COMPARISON:  06/03/2017 and 05/28/2017 CT abdomen and pelvis. FINDINGS: Lower chest: Numerous tiny centrilobular nodules in bronchovascular distribution throughout the right greater than left lung bases. Hepatobiliary: Mild lobulation of liver contour may represent cirrhosis. No focal liver abnormality is seen. Status post cholecystectomy. No biliary  dilatation. Pancreas: Unremarkable. No pancreatic ductal dilatation or surrounding inflammatory changes. Spleen: Normal in size without focal abnormality. Adrenals/Urinary Tract: Adrenal glands are unremarkable. Stable right kidney interpolar punctate nonobstructing stone. Kidneys are otherwise normal, without renal calculi, focal lesion, or hydronephrosis. Bladder is unremarkable. Stomach/Bowel: Stomach is within normal limits. Appendix not identified, no pericecal inflammation. No evidence of bowel wall thickening, distention, or inflammatory changes. Large volume of stool throughout the colon. Vascular/Lymphatic: Aortic atherosclerosis. No enlarged abdominal or pelvic lymph nodes. Reproductive: Central prostatic calcification. Other: No abdominal wall hernia or abnormality. No abdominopelvic ascites. Musculoskeletal: No acute or significant osseous findings. IMPRESSION: 1. Tiny centrilobular nodules in bronchovascular distribution in the lung bases compatible small airways disease, possibly infectious/inflammatory bronchiolitis or aspiration given distribution. 2. Possibly early changes of cirrhosis. 3. Stable punctate right kidney nonobstructive nephrolithiasis. 4. Large volume of stool through colon. 5. Aortic atherosclerosis. 6. No acute process in the abdomen or pelvis identified. Electronically Signed   By: Mitzi Hansen M.D.   On: 07/01/2017 18:17   Dg Chest 2 View  Result Date: 07/01/2017 CLINICAL DATA:  Seizure.  Weakness. EXAM: CHEST - 2 VIEW COMPARISON:  06/03/2017 FINDINGS: Stable mild elevation of  left hemidiaphragm. Both lungs are clear. Heart and mediastinum are within normal limits. Trachea is midline. No pleural effusions. Stable compression deformity in the midthoracic spine. IMPRESSION: No active cardiopulmonary disease. Electronically Signed   By: Richarda Overlie M.D.   On: 07/01/2017 17:27   Ct Head Wo Contrast  Result Date: 07/14/2017 CLINICAL DATA:  Head trauma. Seizures and  hallucinations with multiple falls. EXAM: CT HEAD WITHOUT CONTRAST TECHNIQUE: Contiguous axial images were obtained from the base of the skull through the vertex without intravenous contrast. COMPARISON:  Head CT 06/03/2017 FINDINGS: Brain: Moderate patient motion limits detailed assessment. Unchanged multifocal encephalomalacia throughout the left cerebral hemisphere with associated ex vacuo dilatation of the left lateral ventricle. Unchanged tracheal aside from prior exam. No evidence of acute hemorrhage. No midline shift. Vascular: No gross hyperdense vessel, motion limited evaluation. Skull: Prior left craniotomy. No evidence of acute abnormality allowing for motion. Sinuses/Orbits: Chronic opacification of right maxillary sinus. No acute finding. Other: None. IMPRESSION: 1. Motion limited exam demonstrating no gross acute abnormality. 2. Chronic left cerebral encephalomalacia and ex vacuo dilatation of the left lateral ventricle, grossly stable allowing for motion. Electronically Signed   By: Rubye Oaks M.D.   On: 07/14/2017 00:33   Dg Chest Port 1 View  Result Date: 07/11/2017 CLINICAL DATA:  Seizure this morning with tremors. EXAM: PORTABLE CHEST 1 VIEW COMPARISON:  07/01/2017 FINDINGS: The heart size and mediastinal contours are within normal limits. Stable mild elevation of the left hemidiaphragm unchanged in appearance. Midline trachea without deviation. Both lungs are clear. The visualized skeletal structures are unremarkable. IMPRESSION: No active disease. Electronically Signed   By: Tollie Eth M.D.   On: 07/11/2017 19:24   Dg Hand Complete Left  Result Date: 07/14/2017 CLINICAL DATA:  Fall EXAM: LEFT HAND - COMPLETE 3+ VIEW COMPARISON:  None. FINDINGS: Acute comminuted fracture involving the mid and distal aspect of the fifth proximal phalanx with ulnar and dorsal angulation of distal fracture fragment. No subluxation. No radiopaque foreign body. IMPRESSION: Acute comminuted and angulated  fracture involving the fifth proximal phalanx Electronically Signed   By: Jasmine Pang M.D.   On: 07/14/2017 00:16    DISCHARGE EXAMINATION: Vitals:   07/17/17 1307 07/17/17 2116 07/18/17 0630 07/18/17 1445  BP: 105/64 118/72 118/72 106/64  Pulse: 74 66 63 73  Resp: 20 18 16 18   Temp: 98.3 F (36.8 C) 97.8 F (36.6 C) 97.9 F (36.6 C) 97.9 F (36.6 C)  TempSrc: Oral Oral Oral Oral  SpO2: 100% 98% 97% 98%  Weight:   51.1 kg (112 lb 10.5 oz)   Height:       General appearance: alert, cooperative, appears stated age and no distress Resp: clear to auscultation bilaterally Cardio: regular rate and rhythm, S1, S2 normal, no murmur, click, rub or gallop GI: soft, non-tender; bowel sounds normal; no masses,  no organomegaly  DISPOSITION: Home with father  Discharge Instructions    Call MD for:  difficulty breathing, headache or visual disturbances   Complete by:  As directed    Call MD for:  extreme fatigue   Complete by:  As directed    Call MD for:  persistant dizziness or light-headedness   Complete by:  As directed    Call MD for:  persistant nausea and vomiting   Complete by:  As directed    Call MD for:  temperature >100.4   Complete by:  As directed    Discharge instructions   Complete by:  As  directed    Please note changes to your medications.  You were cared for by a hospitalist during your hospital stay. If you have any questions about your discharge medications or the care you received while you were in the hospital after you are discharged, you can call the unit and asked to speak with the hospitalist on call if the hospitalist that took care of you is not available. Once you are discharged, your primary care physician will handle any further medical issues. Please note that NO REFILLS for any discharge medications will be authorized once you are discharged, as it is imperative that you return to your primary care physician (or establish a relationship with a primary  care physician if you do not have one) for your aftercare needs so that they can reassess your need for medications and monitor your lab values. If you do not have a primary care physician, you can call [phone] for a physician referral.   Increase activity slowly   Complete by:  As directed         Allergies as of 07/18/2017      Reactions   Ativan [lorazepam] Other (See Comments)   Pt has paradoxical effect with sedating medications.    Lactulose Hives      Medication List    STOP taking these medications   amantadine 100 MG capsule Commonly known as:  SYMMETREL Replaced by:  amantadine 50 MG/5ML solution   doxycycline 100 MG capsule Commonly known as:  VIBRAMYCIN     TAKE these medications   amantadine 50 MG/5ML solution Commonly known as:  SYMMETREL Take 5 mLs (50 mg total) by mouth 2 (two) times daily. Replaces:  amantadine 100 MG capsule   bromocriptine 2.5 MG tablet Commonly known as:  PARLODEL Take 5 mg by mouth 2 (two) times daily.   clonazePAM 1 MG tablet Commonly known as:  KLONOPIN Take 1 mg by mouth at bedtime.   feeding supplement (ENSURE ENLIVE) Liqd Take 237 mLs 3 (three) times daily between meals by mouth.   Lacosamide 150 MG Tabs Take 1 tablet (150 mg total) by mouth 2 (two) times daily.   levETIRAcetam 500 MG tablet Commonly known as:  KEPPRA Take 1 tablet (500 mg total) by mouth 2 (two) times daily.   ondansetron 8 MG disintegrating tablet Commonly known as:  ZOFRAN ODT Take 1 tablet (8 mg total) every 8 (eight) hours as needed by mouth for nausea or vomiting.   oxcarbazepine 600 MG tablet Commonly known as:  TRILEPTAL TAKE 1 TABLET BY MOUTH TWICE A DAY What changed:    how much to take  how to take this  when to take this   pantoprazole 40 MG tablet Commonly known as:  PROTONIX Take 40 mg by mouth daily.   polyethylene glycol packet Commonly known as:  MIRALAX / GLYCOLAX Take 17 g by mouth daily as needed for moderate  constipation.   primidone 50 MG tablet Commonly known as:  MYSOLINE Take 2 tablets (100 mg total) by mouth at bedtime.   QUEtiapine 100 MG tablet Commonly known as:  SEROQUEL 100 MG AT 10 AM, 100 MG AT 2 PM, 200 MG AT BEDTIME   thiamine 100 MG tablet Take 1 tablet (100 mg total) by mouth daily.        Follow-up Information    Mack Hookhompson, David, MD. Schedule an appointment as soon as possible for a visit.   Specialty:  Orthopedic Surgery Why:  for follow-up this week Contact  information: [address] [place] [address] [phone]        Medicine, [facility]. Schedule an appointment as soon as possible for a visit in 1 week(s).   Specialty:  Pediatric Gastroenterology       Drema Dallas, DO Follow up.   Specialty:  Neurology Why:  to discuss amantadine Contact information: [address] [place] [address] [phone]           TOTAL DISCHARGE TIME: 35 mins  Osvaldo Shipper  [facility] Pager [phone]  07/18/2017, 2:49 PM

## 2017-07-18 NOTE — Progress Notes (Signed)
Physical Therapy Treatment Patient Details Name: Hector [name] MRN: [id number] DOB: May 26, 1970 Today's Date: 07/18/2017    History of Present Illness 47 y.o. 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  MinGuard Assist for safety however pt able to self perfform.  No LOB.  Mild gross motor mvts but functional and performing at prior level of mobility.  Pt lives with his father and plans to return.  No HH PT needed and no new equipment needed.   Follow Up Recommendations  Supervision/Assistance - 24 hour     Equipment Recommendations  None recommended by PT    Recommendations for Other Services       Precautions / Restrictions Precautions Precautions: Fall Precaution Comments: Hx seizure, L index finger Fx (cast splint) Restrictions Weight Bearing Restrictions: No    Mobility  Bed Mobility Overal bed mobility: Needs Assistance Bed Mobility: Supine to Sit;Sit to Supine     Supine to sit: Supervision;Min guard Sit to supine: Supervision;Min guard   General bed mobility comments: able to self perform only required assist with blankets and condom cath tubing  Transfers Overall transfer level: Needs assistance Equipment used: None Transfers: Sit to/from [employer] Transfers Sit to Stand: Supervision;Min guard Stand pivot transfers: Supervision;Min guard       General transfer comment: MinGuard for safety but able to self perfrom.  Mild gross motor control mvmts but functional.   Ambulation/Gait Ambulation/Gait assistance: Supervision;Min guard Ambulation Distance (Feet): 300 Feet Assistive device: 4-wheeled walker Gait Pattern/deviations: Step-to pattern;Step-through pattern Gait  velocity: WFL   General Gait Details: slightly unsteady MinGuard Assist for safety but no true LOB and able to self advance walker around corners and thru doorway.  Mild gross motor control mvts but appears at baseline.     Stairs            Wheelchair Mobility    Modified Rankin (Stroke Patients Only)       Balance                                            Cognition Arousal/Alertness: Awake/alert Behavior During Therapy: WFL for tasks assessed/performed                                   General Comments: following all commands and pleasant (Hx expressive aphagia with mumbled speech)      Exercises      General Comments        Pertinent Vitals/Pain Pain Assessment: Faces Faces Pain Scale: Hurts little more Pain Location: finger Pain Descriptors / Indicators: Constant Pain Intervention(s): Monitored during session    Home Living                      Prior Function            PT Goals (current goals can now be found in the care plan section) Progress towards PT goals: Progressing toward goals    Frequency    Min 2X/week      PT Plan Current plan remains appropriate  Co-evaluation              AM-PAC PT "6 Clicks" Daily Activity  Outcome Measure  Difficulty turning over in bed (including adjusting bedclothes, sheets and blankets)?: A Little Difficulty moving from lying on back to sitting on the side of the bed? : A Little Difficulty sitting down on and standing up from a chair with arms (e.g., wheelchair, bedside commode, etc,.)?: A Little Help needed moving to and from a bed to chair (including a wheelchair)?: A Little Help needed walking in hospital room?: A Little Help needed climbing 3-5 steps with a railing? : A Little 6 Click Score: 18    End of Session Equipment Utilized During Treatment: Gait belt   Patient left: in bed;with bed alarm set;with call bell/phone within reach;Other  (comment)(tele sitter) Nurse Communication: Mobility status PT Visit Diagnosis: Unsteadiness on feet (R26.81);Muscle weakness (generalized) (M62.81)     Time: 1315-1330 PT Time Calculation (min) (ACUTE ONLY): 15 min  Charges:  $Gait Training: 8-22 mins                    G Codes:       {Naamah Boggess  PTA [facility]  Acute  Rehab Pager      [phone]

## 2017-07-19 ENCOUNTER — Telehealth: Payer: Self-pay | Admitting: Neurology

## 2017-07-19 NOTE — Telephone Encounter (Signed)
Patient's dad called regarding his son. He will now be taking a liquid form of the Amatadine, was taking pill form. When he was in the hospital he was told to call Dr. Karel JarvisAquino to make sure that would be ok? He was having mood swings. Please Call. Thanks

## 2017-07-19 NOTE — Telephone Encounter (Signed)
Dr Emmaline KluverJaffe-is there an issue with Pt using liquid opposed to pill?

## 2017-07-20 NOTE — Telephone Encounter (Signed)
That should be okay, but he probably doesn't need to be on it at all.  He was just discharged from the hospital so I would like to fit him in for follow up ASAP to discuss further (use work-in slot or put top of wait list).

## 2017-07-20 NOTE — Telephone Encounter (Signed)
Dr Everlena CooperJaffe wants Pt seen ASAP for hsp f/u-see his note. I have already spoken with Konrad DoloresLester (father) please call him to schedule.

## 2017-08-04 ENCOUNTER — Ambulatory Visit: Payer: Self-pay | Admitting: Neurology

## 2017-08-09 ENCOUNTER — Ambulatory Visit: Payer: Medicaid Other | Admitting: Neurology

## 2017-08-09 ENCOUNTER — Encounter: Payer: Self-pay | Admitting: Neurology

## 2017-08-09 VITALS — BP 100/60 | HR 82 | Ht 68.5 in | Wt 115.1 lb

## 2017-08-09 DIAGNOSIS — R251 Tremor, unspecified: Secondary | ICD-10-CM | POA: Diagnosis not present

## 2017-08-09 DIAGNOSIS — G40209 Localization-related (focal) (partial) symptomatic epilepsy and epileptic syndromes with complex partial seizures, not intractable, without status epilepticus: Secondary | ICD-10-CM

## 2017-08-09 DIAGNOSIS — S098XXA Other specified injuries of head, initial encounter: Secondary | ICD-10-CM

## 2017-08-09 DIAGNOSIS — R4701 Aphasia: Secondary | ICD-10-CM

## 2017-08-09 MED ORDER — VITAMIN B-1 100 MG PO TABS: 100.0000 mg | ORAL_TABLET | Freq: Every day | ORAL | 0 refills | Status: DC

## 2017-08-09 MED ORDER — LEVETIRACETAM 500 MG PO TABS: 500.0000 mg | ORAL_TABLET | Freq: Two times a day (BID) | ORAL | 6 refills | Status: DC

## 2017-08-09 MED ORDER — ONDANSETRON 8 MG PO TBDP: 8.0000 mg | ORAL_TABLET | Freq: Three times a day (TID) | ORAL | 0 refills | Status: DC | PRN

## 2017-08-09 NOTE — Patient Instructions (Signed)
1.  STOP amantadine 2.  CONTINUE:  Oxcarbazepine 600mg  twice daily  lacosamide 150mg  twice daily  levetiracetam 500mg  twice daily  Primidone 100mg  at bedtime 3.  Follow up in 3 months.

## 2017-08-09 NOTE — Progress Notes (Signed)
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  He is accompanied by his father who provides some history.  Labs reviewed.   UPDATE: Due to decreased appetite and weight loss, topiramate was tapered off and lacosamide was increased from 50mg  twice daily to 100mg  twice daily and then 150mg  twice daily.  For tremor, he was started on primidone 50mg  at bedtime, which was subsequently increased to 100mg  at bedtime.  Since then, he has had several more visits to the hospital.  He has had breathrough seizure and encephalopathy in setting of UTI and pneumonia.  He was admitted to [facility] on 07/13/17 for altered mental status.  CT head showed no acute findings.  He was found to be uremic.  Keppra was started and amantadine was decreased from 100mg  to 50mg .    Current medication: Trileptal 600mg  twice daily. Keppra 500mg  twice daily Vimpat 150mg  twice daily Primidone 100mg  at bedtime  Since discharge, he has been doing well. Appetite has improved and he has gained 11 lbs.    HISTORY: On 08/12/13, he had a seizure while in a convenience store.  He fell, hitting his head on the pavement.  [facility] was [facility].  CT of the head showed a large left subdural hematoma with subarachnoid blood over the left convexity with a 12 mm left to right shift.  He underwent emergent craniotomy.  He was in an induced coma for 4-5 weeks.  His hospital course was complicated by development of a DVT secondary to PICC, requiring heparin drip, VDRF status post extubation, and pneumonia.  He is still taking Xarelto.  He had a G-tube placed for nutrition.  He followed up with an neurologist, Dr. Caswell CorwinHoward Kraft.  He was placed on Zoloft for agitation and outbursts.  He  presented to the ED at [facility] on 12/06/13 for new shaking episodes with extreme agitation and combativeness.  It seemed to have occurred after taking a dose of risperidone.  CT of the head from 12/06/13, showed post-surgical changes, with interval clearing of left extra-axial fluid collection, mild increase in ventricular size (left greater than right), hypodense areas involving the left cerebrum and no midline shift or hemorrhage.  He was found to have HCAP and sepsis and was treated with antibiotics.     He resides at [facility].  He has regained much more strength in the right upper extremity.  His mood is stable.  Recently, his PEG and trach was removed.  He has an unsteady gait but ambulates with other people around.  He has global aphasia.  He finished PT/OT and is currently still undergoing speech therapy.  He has had no further seizures.  He denies headache   He has a history of alcoholism.  Prior to the accident, he had two other seizures over a 5-6 year period.  It was attributed to his drinking.  He was never taking an antiepileptic medication.  He has a tenth grade education.  Prior to the accident, he had his own apartment.  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  He has had several hospitalizations often due to worsening tremor or increased agitation and confusion.   He was [facility] to [facility] in September 2018  for severe protein calorie malnutrition with failure to thrive, acute on chronic kidney disease and orthostatic hypotension.  He was readmitted in November for acute gastroenteritis.  Tremors have gotten worse.  They did not respond to increased bromocriptine.  In December 2018, he was again admitted to the hospital for worsening weakness, as well as worsening tremor and jerking movement.  Brain MRI revealed left hemispheric encephalomalacia but no acute findings.  EEG did not reveal any epileptiform discharges or  seizures.  Suspecting polypharmacy, Linzess and Seroquel were discontinued.  Lexapro was reduced to 5mg  daily.  He still has tremors.  His appetite continues to be poor and he has lost weight.  He is weak.   Prior antiepileptic medications:  Depakote (tremor), Keppra (irritability), Lamictal (rash)  PAST MEDICAL HISTORY: Past Medical History:  Diagnosis Date  . Cirrhosis (HCC)   . Depression   . DVT (deep venous thrombosis) (HCC)    UE, LE DVT during hospitalization  . GERD (gastroesophageal reflux disease)   . H/O ETOH abuse   . H/O renal calculi   . Seizure (HCC)   . TBI (traumatic brain injury) (HCC)    07/2013, fall from standing post seizure  . Tremor     MEDICATIONS: Current Outpatient Medications on File Prior to Visit  Medication Sig Dispense Refill  . bromocriptine (PARLODEL) 2.5 MG tablet Take 5 mg by mouth 2 (two) times daily.    . clonazePAM (KLONOPIN) 1 MG tablet Take 1 mg by mouth at bedtime.  1  . feeding supplement, ENSURE ENLIVE, (ENSURE ENLIVE) LIQD Take 237 mLs 3 (three) times daily between meals by mouth. 237 mL 12  . Lacosamide 150 MG TABS Take 1 tablet (150 mg total) by mouth 2 (two) times daily. 60 tablet 5  . levETIRAcetam (KEPPRA) 500 MG tablet Take 1 tablet (500 mg total) by mouth 2 (two) times daily. 60 tablet 0  . ondansetron (ZOFRAN ODT) 8 MG disintegrating tablet Take 1 tablet (8 mg total) every 8 (eight) hours as needed by mouth for nausea or vomiting. 20 tablet 0  . oxcarbazepine (TRILEPTAL) 600 MG tablet TAKE 1 TABLET BY MOUTH TWICE A DAY (Patient taking differently: TAKE 600 mg TABLET BY MOUTH TWICE A DAY) 60 tablet 5  . pantoprazole (PROTONIX) 40 MG tablet Take 40 mg by mouth daily.     . polyethylene glycol (MIRALAX / GLYCOLAX) packet Take 17 g by mouth daily as needed for moderate constipation.    . primidone (MYSOLINE) 50 MG tablet Take 2 tablets (100 mg total) by mouth at bedtime. 60 tablet 3  . QUEtiapine (SEROQUEL) 100 MG tablet 100 MG AT 10  AM, 100 MG AT 2 PM, 200 MG AT BEDTIME  3  . thiamine 100 MG tablet Take 1 tablet (100 mg total) by mouth daily. 30 tablet 2   No current facility-administered medications on file prior to visit.     ALLERGIES: Allergies  Allergen Reactions  . Ativan [Lorazepam] Other (See Comments)    Pt has paradoxical effect with sedating medications.   . Lactulose Hives    FAMILY HISTORY: Family History  Problem Relation Age of Onset  . Hypercholesterolemia Mother   . Hypertension Mother   . Congestive Heart Failure Brother        Deceased age 44    SOCIAL HISTORY: Social History   Socioeconomic History  . Marital status: Single    Spouse name: Not on file  . Number of children: Not on file  .  Years of education: Not on file  . Highest education level: Not on file  Occupational History  . Not on file  Social Needs  . Financial resource strain: Not on file  . Food insecurity:    Worry: Not on file    Inability: Not on file  . Transportation needs:    Medical: Not on file    Non-medical: Not on file  Tobacco Use  . Smoking status: Former Smoker    Packs/day: 0.25    Years: 20.00    Pack years: 5.00    Types: Cigarettes    Last attempt to quit: 04/17/2017    Years since quitting: 0.3  . Smokeless tobacco: Never Used  Substance and Sexual Activity  . Alcohol use: Yes    Alcohol/week: 0.0 oz  . Drug use: No  . Sexual activity: Not on file  Lifestyle  . Physical activity:    Days per week: Not on file    Minutes per session: Not on file  . Stress: Not on file  Relationships  . Social connections:    Talks on phone: Not on file    Gets together: Not on file    Attends religious service: Not on file    Active member of club or organization: Not on file    Attends meetings of clubs or organizations: Not on file    Relationship status: Not on file  . Intimate partner violence:    Fear of current or ex partner: Not on file    Emotionally abused: Not on file     Physically abused: Not on file    Forced sexual activity: Not on file  Other Topics Concern  . Not on file  Social History Narrative  . Not on file    REVIEW OF SYSTEMS: Constitutional: No fevers, chills, or sweats, no generalized fatigue, change in appetite Eyes: No visual changes, double vision, eye pain Ear, nose and throat: No hearing loss, ear pain, nasal congestion, sore throat Cardiovascular: No chest pain, palpitations Respiratory:  No shortness of breath at rest or with exertion, wheezes GastrointestinaI: No nausea, vomiting, diarrhea, abdominal pain, fecal incontinence Genitourinary:  No dysuria, urinary retention or frequency Musculoskeletal:  No neck pain, back pain Integumentary: No rash, pruritus, skin lesions Neurological: as above Psychiatric: No depression, insomnia, anxiety Endocrine: No palpitations, fatigue, diaphoresis, mood swings, change in appetite, change in weight, increased thirst Hematologic/Lymphatic:  No purpura, petechiae. Allergic/Immunologic: no itchy/runny eyes, nasal congestion, recent allergic reactions, rashes  PHYSICAL EXAM: Vitals:   08/09/17 1250  BP: 100/60  Pulse: 82  SpO2: 96%   General: No acute distress.  Patient appears well-groomed.  Head:  Normocephalic/atraumatic Eyes:  Fundi examined but not visualized Neck: supple, no paraspinal tenderness, full range of motion Heart:  Regular rate and rhythm Lungs:  Clear to auscultation bilaterally Back: No paraspinal tenderness Neurological Exam: alert, unable to assess orientation, recent and remote memory, fund of knowledge, attention and concentration. Speech fluent and not dysarthric, but unintelligible.  Expressive more than receptive aphasia.  Unable to name or repeat.  Follows some simple commands but not others.  Unable to write.  Right pupil 0.15mm larger than left pupil but both round and reactive to light.  Unable to assess CN V.  Blinks to threat bilaterally.  Otherwise, CN II-XII  intact.  mild increased tone in right upper extremity, no fasciculations.  Motor 5-/5 on the right upper and lower extremities.  5/5 left side.  Unable to assess sensation.  DTR 3+ right upper and lower extremities.  2+ on left.  Postural and kinetic tremor of upper extremities.  Wide-based gait. IMPRESSION: 1.  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  Tremor.  It did not improve with bromocriptine, topiramate or discontinuation of Depakote or other neuroleptic medication.  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  STOP amantadine 2.  CONTINUE:  Oxcarbazepine 600mg  twice daily  lacosamide 150mg  twice daily  levetiracetam 500mg  twice daily  Primidone 100mg  at bedtime 3.  Follow up in 3 months.  15 minutes spent face to face with patient, over 50% spent discussing management.  Shon Millet, DO  CC:  Delbert Harness, MD

## 2017-08-09 NOTE — Addendum Note (Signed)
Addended by: Dorthy CoolerBURNS, SANDRA J on: 08/09/2017 01:20 PM   Modules accepted: Orders

## 2017-08-11 ENCOUNTER — Telehealth: Payer: Self-pay | Admitting: Neurology

## 2017-08-11 NOTE — Telephone Encounter (Signed)
Monitor for now and see how he does over weekend

## 2017-08-11 NOTE — Telephone Encounter (Signed)
Called and spoke with father. He states since d/c the amantadine, Pt has been drooling. This is new. There are no othere sypmtoms. He states the Pt was on this medication along with Keppra, but believes this is the reason Dr Everlena CooperJaffe stopped the amantadine. He wants to start him back on the amantadine and call back Monday after the Easter holiday to let us know how he is doing.

## 2017-08-11 NOTE — Telephone Encounter (Signed)
Patient' father called and said that he was taken off a medication and since then he has started drooling. Please Call. Thanks

## 2017-08-15 NOTE — Telephone Encounter (Signed)
Called and spoke with Hector [name] to see how Pt's symptom of drooling has been over the weekend. Hector [name] is giving Pt 5ml of amantadine BID (5a,6p). Wants to continue.

## 2017-08-24 ENCOUNTER — Encounter: Payer: Self-pay | Admitting: Physical Medicine & Rehabilitation

## 2017-08-25 ENCOUNTER — Encounter (HOSPITAL_COMMUNITY): Payer: Self-pay | Admitting: Emergency Medicine

## 2017-08-25 ENCOUNTER — Emergency Department (HOSPITAL_COMMUNITY)
Admission: EM | Admit: 2017-08-25 | Discharge: 2017-08-25 | Disposition: A | Discharge status: Home / Self Care | Payer: Medicaid Other | Attending: Emergency Medicine | Admitting: Emergency Medicine

## 2017-08-25 ENCOUNTER — Other Ambulatory Visit: Payer: Self-pay

## 2017-08-25 DIAGNOSIS — R21 Rash and other nonspecific skin eruption: Secondary | ICD-10-CM | POA: Insufficient documentation

## 2017-08-25 DIAGNOSIS — R609 Edema, unspecified: Secondary | ICD-10-CM | POA: Insufficient documentation

## 2017-08-25 DIAGNOSIS — Z87891 Personal history of nicotine dependence: Secondary | ICD-10-CM | POA: Insufficient documentation

## 2017-08-25 DIAGNOSIS — Z79899 Other long term (current) drug therapy: Secondary | ICD-10-CM | POA: Diagnosis not present

## 2017-08-25 LAB — CBC WITH DIFFERENTIAL/PLATELET
BASOS ABS: 0 10*3/uL (ref 0.0–0.1)
Basophils Relative: 0 %
EOS ABS: 0.6 10*3/uL (ref 0.0–0.7)
EOS PCT: 9 %
HCT: 34.7 % — ABNORMAL LOW (ref 39.0–52.0)
Hemoglobin: 11.6 g/dL — ABNORMAL LOW (ref 13.0–17.0)
LYMPHS ABS: 0.8 10*3/uL (ref 0.7–4.0)
Lymphocytes Relative: 12 %
MCH: 31.1 pg (ref 26.0–34.0)
MCHC: 33.4 g/dL (ref 30.0–36.0)
MCV: 93 fL (ref 78.0–100.0)
Monocytes Absolute: 0.7 10*3/uL (ref 0.1–1.0)
Monocytes Relative: 10 %
Neutro Abs: 4.6 10*3/uL (ref 1.7–7.7)
Neutrophils Relative %: 69 %
PLATELETS: 164 10*3/uL (ref 150–400)
RBC: 3.73 MIL/uL — AB (ref 4.22–5.81)
RDW: 13.5 % (ref 11.5–15.5)
WBC: 6.7 10*3/uL (ref 4.0–10.5)

## 2017-08-25 LAB — COMPREHENSIVE METABOLIC PANEL
ALBUMIN: 3.9 g/dL (ref 3.5–5.0)
ALK PHOS: 150 U/L — AB (ref 38–126)
ALT: 15 U/L — AB (ref 17–63)
AST: 22 U/L (ref 15–41)
Anion gap: 8 (ref 5–15)
BUN: 16 mg/dL (ref 6–20)
CO2: 27 mmol/L (ref 22–32)
CREATININE: 1.29 mg/dL — AB (ref 0.61–1.24)
Calcium: 9.2 mg/dL (ref 8.9–10.3)
Chloride: 104 mmol/L (ref 101–111)
GFR calc Af Amer: >60 mL/min (ref >60)
GLUCOSE: 115 mg/dL — AB (ref 65–99)
Potassium: 4.6 mmol/L (ref 3.5–5.1)
Sodium: 139 mmol/L (ref 135–145)
TOTAL PROTEIN: 7.7 g/dL (ref 6.5–8.1)
Total Bilirubin: 0.3 mg/dL (ref 0.3–1.2)

## 2017-08-25 LAB — I-STAT CG4 LACTIC ACID, ED: Lactic Acid, Venous: 0.86 mmol/L (ref 0.5–1.9)

## 2017-08-25 MED ORDER — MUPIROCIN CALCIUM 2 % EX CREA: 1.0000 "application " | TOPICAL_CREAM | Freq: Three times a day (TID) | CUTANEOUS | 0 refills | Status: DC

## 2017-08-25 MED ORDER — DEXAMETHASONE SODIUM PHOSPHATE 10 MG/ML IJ SOLN
10.0000 mg | Freq: Once | INTRAMUSCULAR | Status: AC
  Administered 2017-08-25: 10 mg via INTRAMUSCULAR
  Filled 2017-08-25: qty 1

## 2017-08-25 NOTE — ED Provider Notes (Signed)
Medical screening examination/treatment/procedure(s) were conducted as a shared visit with non-physician practitioner(s) and myself.  I personally evaluated the patient during the encounter. Briefly, the patient is a 47 y.o. male here with rash to torso, arms and legs with superimposed impetigo to feet. No know trigger.possible allergic reaction vs contact dermatitis. He is AFVSS, nontoxic appearing. Labs reassuring. Will DC with bactroban. Close PCP f/u recommended. The patient is safe for discharge with strict return precautions.    EKG Interpretation None           Johnnetta Holstine, Amadeo Garnet, MD 08/26/17 1002

## 2017-08-25 NOTE — ED Provider Notes (Signed)
[facility]-EMERGENCY DEPT Provider Note   CSN: [id number] Arrival date & time: 08/25/17  1328     History   Chief Complaint Chief Complaint  Patient presents with  . Foot Swelling    HPI Hector [name] is a 47 y.o. male who presents with leg swelling and rash. PMH significant for TBI, cirrhosis, hx of DVT, seizure d/o. He states that he does have issues with intermittent leg swelling. Over the past three days he has had gradually worsening bilateral lower leg edema. He has also developed a very itchy rash over the bilateral feet up the legs to the thigh and hip area. He also has a rash over his lower back and a little bit over his neck and underarms as well. He has been scratching a lot. The feet have been weeping. He has not spent a lot of time outside or in the woods. When he is outside he will wear shoes. No one at home has a similar rash. He has not had a similar rash either. He has not had a fever. No new soaps, lotions, detergents. No medicines were tried PTA. 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  HPI  Past Medical History:  Diagnosis Date  . Cirrhosis (HCC)   . Depression   . DVT (deep venous thrombosis) (HCC)    UE, LE DVT during hospitalization  . GERD (gastroesophageal reflux disease)   . H/O ETOH abuse   . H/O renal calculi   . Seizure (HCC)   . TBI (traumatic brain injury) (HCC)    07/2013, fall from standing post seizure  . Tremor     Patient Active Problem List   Diagnosis Date Noted  . Status post seizure 07/14/2017  . Agitation   . Aspiration pneumonia due to gastric secretions (HCC)   . Hypoxia   . Coarse tremors 04/18/2017  . ARF (acute renal failure) (HCC) 04/18/2017  . Hypokalemia   . Hypomagnesemia   . Physical deconditioning   . FTT (failure to thrive) in adult 02/25/2017  . Oropharyngeal dysphagia 01/01/2017  . Orthostatic hypotension 01/01/2017  . Failure to thrive in adult 12/31/2016  . Vomiting   . Seizures  (HCC) 06/01/2015  . Alcoholic cirrhosis of liver without ascites (HCC)   . Dehydration   . Encephalopathy acute 02/13/2015  . Altered mental status   . Ataxia 02/12/2015  . Localization-related symptomatic epilepsy and epileptic syndromes with complex partial seizures, not intractable, without status epilepticus (HCC) 08/16/2014  . Tobacco abuse 08/16/2014  . Acute kidney injury superimposed on CKD (HCC) 06/25/2014  . Dysphagia 06/25/2014  . Centrilobular emphysema (HCC) 01/27/2014  . Tracheostomy in place [facility]) 01/27/2014  . Encounter for family conference with patient present 01/23/2014  . Dvt femoral (deep venous thrombosis) (HCC) 01/05/2014  . Protein-calorie malnutrition, severe (HCC) 01/05/2014  . Alcohol abuse 01/05/2014  . Acute respiratory failure with hypoxia (HCC) 12/20/2013  . Delirium due to general medical condition 12/13/2013  . HCAP (healthcare-associated pneumonia) 12/13/2013  . SIRS (systemic inflammatory response syndrome) (HCC) 12/13/2013  . TBI (traumatic brain injury) (HCC) 12/12/2013    Past Surgical History:  Procedure Laterality Date  . BRAIN SURGERY    . GASTROSTOMY TUBE PLACEMENT    . RADIOLOGY WITH ANESTHESIA N/A 04/20/2017   Procedure: MRI WITH ANESTHESIA OF THE BRAIN;  Surgeon: Radiologist, Medication, MD;  Location: [facility] OR;  Service: Radiology;  Laterality: N/A;  . REMOVAL OF GASTROSTOMY TUBE    . TRACHEOSTOMY  feinstein        Home Medications    Prior to Admission medications   Medication Sig Start Date End Date Taking? Authorizing Provider  bromocriptine (PARLODEL) 2.5 MG tablet Take 5 mg by mouth 2 (two) times daily.   Yes [provider]  clonazePAM (KLONOPIN) 1 MG tablet Take 1 mg by mouth at bedtime. 12/24/16  Yes [provider]  levETIRAcetam (KEPPRA) 500 MG tablet Take 1 tablet (500 mg total) by mouth 2 (two) times daily. 08/09/17  Yes Jaffe, Adam R, DO  ondansetron (ZOFRAN-ODT) 8 MG disintegrating tablet Take 1  tablet (8 mg total) by mouth every 8 (eight) hours as needed for nausea or vomiting. Patient taking differently: Take 8 mg by mouth 2 (two) times daily.  08/09/17  Yes Jaffe, Adam R, DO  oxcarbazepine (TRILEPTAL) 600 MG tablet TAKE 1 TABLET BY MOUTH TWICE A DAY Patient taking differently: TAKE 600 mg TABLET BY MOUTH TWICE A DAY 05/03/17  Yes Jaffe, Adam R, DO  pantoprazole (PROTONIX) 40 MG tablet Take 40 mg by mouth daily.    Yes [provider]  primidone (MYSOLINE) 50 MG tablet Take 2 tablets (100 mg total) by mouth at bedtime. 07/13/17  Yes Tat, Octaviano Batty, DO  QUEtiapine (SEROQUEL) 100 MG tablet 100 MG AT 10 AM, 100 MG AT 2 PM, 200 MG AT BEDTIME 06/15/17  Yes [provider]  thiamine (VITAMIN B-1) 100 MG tablet Take 1 tablet (100 mg total) by mouth daily. 08/09/17  Yes Jaffe, Adam R, DO  VIMPAT 150 MG TABS Take 1 tablet by mouth 2 (two) times daily. 08/15/17  Yes [provider]  mupirocin cream (BACTROBAN) 2 % Apply 1 application topically 3 (three) times daily. For 5 days 08/25/17   Bethel Born, PA-C  polyethylene glycol [facility]) packet Take 17 g by mouth daily as needed for moderate constipation.    [provider]    Family History Family History  Problem Relation Age of Onset  . Hypercholesterolemia Mother   . Hypertension Mother   . Congestive Heart Failure Brother        Deceased age 44    Social History Social History   Tobacco Use  . Smoking status: Former Smoker    Packs/day: 0.25    Years: 20.00    Pack years: 5.00    Types: Cigarettes    Last attempt to quit: 04/17/2017    Years since quitting: 0.3  . Smokeless tobacco: Never Used  Substance Use Topics  . Alcohol use: Yes    Alcohol/week: 0.0 oz  . Drug use: No     Allergies   Ativan [lorazepam] and Lactulose   Review of Systems Review of Systems  Constitutional: Negative for fever.  Respiratory: Negative for shortness of breath.   Cardiovascular: Positive  for leg swelling. Negative for chest pain.  Skin: Positive for rash.  All other systems reviewed and are negative.    Physical Exam Updated Vital Signs BP 116/78 (BP Location: Right Arm)   Pulse 83   Temp 97.9 F (36.6 C) (Oral)   Resp 18   SpO2 99%   Physical Exam  Constitutional: He is oriented to person, place, and time. He appears well-developed and well-nourished. No distress.  Chronically ill appearing male in NAD  HENT:  Head: Normocephalic and atraumatic.  Eyes: Pupils are equal, round, and reactive to light. Conjunctivae are normal. Right eye exhibits no discharge. Left eye exhibits no discharge. No scleral icterus.  Neck: Normal range of motion.  Cardiovascular: Normal rate.  Pulmonary/Chest: Effort normal. No respiratory distress.  Abdominal: He exhibits no distension.  Neurological: He is alert and oriented to person, place, and time.  Skin: Skin is warm and dry. Rash (Macerated, excoriated, blistering rash over the bilateral feet. with weeping. Dry, erythematous, excoriated rash over the backs of legs to the bilateral inguinal area. ) noted.  Psychiatric: He has a normal mood and affect. His behavior is normal.  Nursing note and vitals reviewed.          ED Treatments / Results  Labs (all labs ordered are listed, but only abnormal results are displayed) Labs Reviewed  COMPREHENSIVE METABOLIC PANEL - Abnormal; Notable for the following components:      Result Value   Glucose, Bld 115 (*)    Creatinine, Ser 1.29 (*)    ALT 15 (*)    Alkaline Phosphatase 150 (*)    All other components within normal limits  CBC WITH DIFFERENTIAL/PLATELET - Abnormal; Notable for the following components:   RBC 3.73 (*)    Hemoglobin 11.6 (*)    HCT 34.7 (*)    All other components within normal limits  I-STAT CG4 LACTIC ACID, ED    EKG None  Radiology No results found.  Procedures Procedures (including critical care time)  Medications Ordered in  ED Medications  dexamethasone (DECADRON) injection 10 mg (has no administration in time range)     Initial Impression / Assessment and Plan / ED Course  I have reviewed the triage vital signs and the nursing notes.  Pertinent labs & imaging results that were available during my care of the patient were reviewed by me and considered in my medical decision making (see chart for details).  47 year old male presents with worsening bilateral pitting edema of the legs along with a blistering and excoriated rash. Unclear etiology but the rash is generalized and most concerning for an allergic reaction with possible secondary infection. His vitals are normal. His labs are overall unremarkable. Shared visit with Dr. Eudelia Bunch. Will treat for allergic reaction with IM Decadron and have him apply antibiotic ointment and wrap and elevate the legs to reduce swelling. He was advised to make an appointment with his dermatologist for close follow up.  Final Clinical Impressions(s) / ED Diagnoses   Final diagnoses:  Peripheral edema  Rash and nonspecific skin eruption    ED Discharge Orders        Ordered    mupirocin cream (BACTROBAN) 2 %  3 times daily     08/25/17 1603       Bethel Born, PA-C 08/25/17 1638

## 2017-08-25 NOTE — Discharge Instructions (Addendum)
Please wrap and elevate the legs as much as possible to reduce swelling Apply antibiotic ointment (Mupirocin) to the feet three times a day and apply a non-stick dressing to the wounds Follow up with your dermatologist in the next week

## 2017-08-25 NOTE — ED Triage Notes (Signed)
Pt presents with bilateral foot swelling, redness, and weeping "gotten worse over past 36 hours" per family.

## 2017-08-26 ENCOUNTER — Ambulatory Visit: Payer: Self-pay | Admitting: Neurology

## 2017-09-01 ENCOUNTER — Telehealth: Payer: Self-pay | Admitting: Neurology

## 2017-09-01 ENCOUNTER — Other Ambulatory Visit: Payer: Self-pay | Admitting: Neurology

## 2017-09-01 NOTE — Telephone Encounter (Signed)
*  STAT* If patient is at the pharmacy, call can be transferred to refill team.  1.     Which medications need to be refilled? (please list name of each medication and dose if know) Bromocriptine  2.     Which pharmacy/location (including street and city if local pharmacy) is medication to be sent to? [facility] [address]  3.     Do they need a 30 or 90 day supply? 90

## 2017-09-02 NOTE — Telephone Encounter (Signed)
I am not the provider who has been prescribing this medication.

## 2017-09-02 NOTE — Telephone Encounter (Signed)
Called and spoke to [place], [place] him to contact prescribing dr

## 2017-09-06 ENCOUNTER — Encounter: Payer: Medicaid Other | Attending: Physical Medicine & Rehabilitation | Admitting: Physical Medicine & Rehabilitation

## 2017-09-07 ENCOUNTER — Other Ambulatory Visit: Payer: Self-pay | Admitting: Neurology

## 2017-09-08 ENCOUNTER — Other Ambulatory Visit: Payer: Self-pay | Admitting: Neurology

## 2017-10-08 ENCOUNTER — Other Ambulatory Visit: Payer: Self-pay | Admitting: Neurology

## 2017-10-28 ENCOUNTER — Ambulatory Visit: Payer: Medicaid Other | Admitting: Neurology

## 2017-11-08 ENCOUNTER — Other Ambulatory Visit: Payer: Self-pay | Admitting: Neurology

## 2017-11-10 ENCOUNTER — Other Ambulatory Visit: Payer: Self-pay | Admitting: Neurology

## 2017-11-23 ENCOUNTER — Ambulatory Visit: Payer: Self-pay | Admitting: Neurology

## 2017-11-23 ENCOUNTER — Encounter

## 2017-11-24 ENCOUNTER — Ambulatory Visit: Payer: Medicaid Other | Admitting: Neurology

## 2017-11-24 ENCOUNTER — Encounter: Payer: Self-pay | Admitting: Neurology

## 2017-11-24 ENCOUNTER — Other Ambulatory Visit: Payer: Self-pay | Admitting: Neurology

## 2017-11-24 VITALS — BP 124/78 | HR 81 | Ht 65.0 in | Wt 125.0 lb

## 2017-11-24 DIAGNOSIS — S069X5D Unspecified intracranial injury with loss of consciousness greater than 24 hours with return to pre-existing conscious level, subsequent encounter: Secondary | ICD-10-CM | POA: Diagnosis not present

## 2017-11-24 DIAGNOSIS — R4701 Aphasia: Secondary | ICD-10-CM

## 2017-11-24 DIAGNOSIS — S098XXA Other specified injuries of head, initial encounter: Secondary | ICD-10-CM | POA: Diagnosis not present

## 2017-11-24 DIAGNOSIS — G40209 Localization-related (focal) (partial) symptomatic epilepsy and epileptic syndromes with complex partial seizures, not intractable, without status epilepticus: Secondary | ICD-10-CM | POA: Diagnosis not present

## 2017-11-24 NOTE — Patient Instructions (Signed)
1.  Continue oxcarbazepine 600mg  twice daily, levetiracetam 500mg  twice daily and lacosamide 150mg  twice daily for seizure prevention 2.  Continue primidone 100mg  at bedtime for tremor 3.  Follow up in 6 months.
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
## 2017-12-06 ENCOUNTER — Telehealth: Payer: Self-pay | Admitting: Neurology

## 2017-12-06 NOTE — Telephone Encounter (Signed)
Patient father left a VM stating that he needs med sheets for disability

## 2017-12-09 ENCOUNTER — Other Ambulatory Visit: Payer: Self-pay | Admitting: Neurology

## 2017-12-15 ENCOUNTER — Other Ambulatory Visit: Payer: Self-pay | Admitting: Neurology

## 2018-01-09 ENCOUNTER — Other Ambulatory Visit: Payer: Self-pay | Admitting: Neurology

## 2018-01-09 NOTE — Telephone Encounter (Signed)
Ok to refill 

## 2018-01-09 NOTE — Telephone Encounter (Signed)
Please advise on refill.

## 2018-01-12 ENCOUNTER — Other Ambulatory Visit: Payer: Self-pay | Admitting: Neurology

## 2018-02-09 ENCOUNTER — Other Ambulatory Visit: Payer: Self-pay | Admitting: Neurology

## 2018-02-09 ENCOUNTER — Telehealth: Payer: Self-pay | Admitting: Neurology

## 2018-02-09 NOTE — Telephone Encounter (Signed)
Called [name], advised him I was unsure what medication he was referring to. He will need to either call me back, or call [facility] and have [facility] contact me

## 2018-02-09 NOTE — Telephone Encounter (Signed)
Patient is out of his OND and the would [facility] [address]

## 2018-02-10 ENCOUNTER — Telehealth: Payer: Self-pay | Admitting: Neurology

## 2018-02-10 NOTE — Telephone Encounter (Signed)
Patient's father called and is needing to get a refill on Roberts Ondansetron medication. He was unsure of how the medication was spelled. Please verify with the patients father. [facility] [address] He will need a 30 day Supply. Thanks

## 2018-02-10 NOTE — Telephone Encounter (Signed)
Ondansetron 8mg  #30 with 1 refill Sig = take 1 tab every 8 hours PRN for nausea   [facility] [address]

## 2018-03-12 ENCOUNTER — Other Ambulatory Visit: Payer: Self-pay | Admitting: Neurology

## 2018-03-13 ENCOUNTER — Other Ambulatory Visit: Payer: Self-pay | Admitting: Neurology

## 2018-03-21 ENCOUNTER — Other Ambulatory Visit: Payer: Self-pay | Admitting: Neurology

## 2018-03-30 ENCOUNTER — Other Ambulatory Visit: Payer: Self-pay | Admitting: Neurology

## 2018-05-10 ENCOUNTER — Telehealth: Payer: Self-pay | Admitting: Neurology

## 2018-05-10 ENCOUNTER — Other Ambulatory Visit: Payer: Self-pay | Admitting: Neurology

## 2018-05-10 MED ORDER — ONDANSETRON 8 MG PO TBDP: 8.0000 mg | ORAL_TABLET | Freq: Three times a day (TID) | ORAL | 1 refills | Status: DC | PRN

## 2018-05-10 NOTE — Telephone Encounter (Signed)
Patient's father is calling in about only getting the 2 weeks of the medication at pharm and was wondering if he can get it for a month supply at a time so he only has to pay once a month. 2 pills per day- 8mg  Ondansetron. Please send. Thanks! I [facility].

## 2018-05-10 NOTE — Telephone Encounter (Signed)
Sent in Rx

## 2018-05-24 ENCOUNTER — Other Ambulatory Visit: Payer: Self-pay | Admitting: Neurology
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
## 2018-05-29 ENCOUNTER — Ambulatory Visit: Payer: Self-pay | Admitting: Neurology

## 2018-07-12 ENCOUNTER — Other Ambulatory Visit: Payer: Self-pay | Admitting: Neurology

## 2018-07-14 ENCOUNTER — Other Ambulatory Visit: Payer: Self-pay | Admitting: Neurology

## 2018-07-17 ENCOUNTER — Telehealth: Payer: Self-pay | Admitting: Neurology

## 2018-07-17 NOTE — Telephone Encounter (Signed)
Patient is out of the vimpat and [facility] [address]

## 2018-07-17 NOTE — Telephone Encounter (Signed)
This Rx was sent in by Dr Everlena Cooper this AM. [name] to advise Hector [name], call would not go through

## 2018-09-01 ENCOUNTER — Telehealth: Payer: Self-pay | Admitting: Neurology

## 2018-09-01 DIAGNOSIS — F05 Delirium due to known physiological condition: Secondary | ICD-10-CM

## 2018-09-01 NOTE — Telephone Encounter (Signed)
The reason why I want to refer him to psychiatry is because he is already on fairly good doses of seroquel and previously has had problems with polypharmacy and side effects to other mood stabilizing drugs.  I need some assistance from a specialist in managing his mood.

## 2018-09-01 NOTE — Telephone Encounter (Signed)
I would like to refer him to psychiatry

## 2018-09-01 NOTE — Telephone Encounter (Signed)
Called and spoke with [name]. I advised him of psychiatry referral recommendation.
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
## 2018-09-05 ENCOUNTER — Other Ambulatory Visit: Payer: Self-pay

## 2018-09-05 DIAGNOSIS — F05 Delirium due to known physiological condition: Secondary | ICD-10-CM

## 2018-09-05 NOTE — Progress Notes (Signed)
amb ref °

## 2018-09-23 ENCOUNTER — Other Ambulatory Visit: Payer: Self-pay | Admitting: Neurology

## 2018-09-24 ENCOUNTER — Other Ambulatory Visit: Payer: Self-pay | Admitting: Neurology

## 2018-11-23 ENCOUNTER — Telehealth: Payer: Self-pay | Admitting: Neurology

## 2018-11-23 NOTE — Telephone Encounter (Signed)
Requested Prescriptions   Pending Prescriptions Disp Refills  . ondansetron (ZOFRAN-ODT) 8 MG disintegrating tablet [Pharmacy Med Name: ONDANSETRON ODT 8 MG TABLET] 60 tablet 1    Sig: DISSOLVE 1 TABLET ON TONGUE EVERY 8 HOURS AS NEEDED FOR NAUSEA AND VOMITING   Rx last filled: 09/25/18 #60 1 refills  Pt last seen: 11/24/17   Follow up appt scheduled:none  Ok with sending this pt has no follow up appts listed

## 2018-11-24 ENCOUNTER — Telehealth: Payer: Self-pay | Admitting: Neurology

## 2018-11-24 MED ORDER — ONDANSETRON 8 MG PO TBDP: 8.0000 mg | ORAL_TABLET | Freq: Three times a day (TID) | ORAL | 2 refills | Status: DC | PRN

## 2018-11-24 NOTE — Telephone Encounter (Signed)
Rx denied Per provider patient needs appt.  Message sent to pharmacy that patient needs appt for additional refills

## 2018-11-24 NOTE — Telephone Encounter (Signed)
He will need to make a follow up appointment prior to refilling this medication.

## 2018-11-24 NOTE — Telephone Encounter (Signed)
Ondansetron medication refill to the pharm on file. He does not have any left and is scheduled for 12/04/18 to do VV.  Thanks!

## 2018-12-04 ENCOUNTER — Telehealth: Payer: Medicaid Other | Admitting: Neurology

## 2018-12-07 ENCOUNTER — Other Ambulatory Visit: Payer: Self-pay | Admitting: Neurology

## 2018-12-14 ENCOUNTER — Other Ambulatory Visit: Payer: Self-pay | Admitting: Neurology

## 2018-12-18 ENCOUNTER — Other Ambulatory Visit: Payer: Self-pay | Admitting: Neurology

## 2018-12-18 ENCOUNTER — Other Ambulatory Visit: Payer: Self-pay

## 2018-12-18 ENCOUNTER — Telehealth: Payer: Self-pay | Admitting: Neurology

## 2018-12-18 MED ORDER — VIMPAT 150 MG PO TABS: 1.0000 | ORAL_TABLET | Freq: Two times a day (BID) | ORAL | 0 refills | Status: DC

## 2018-12-18 MED ORDER — ONDANSETRON 8 MG PO TBDP: 8.0000 mg | ORAL_TABLET | Freq: Three times a day (TID) | ORAL | 0 refills | Status: AC | PRN

## 2018-12-18 NOTE — Telephone Encounter (Signed)
Refill on the Vimpat and the ondansetron medication- [facility] on file 90 day supply for both. Thanks!

## 2018-12-18 NOTE — Progress Notes (Signed)
Refilled 30 day supply of zofran and Vimpat with no refills as patient needs to make follow up appointment.

## 2018-12-18 NOTE — Telephone Encounter (Signed)
Please contact patient's father stating that I refilled a 30 day supply for Vimpat and Zofran with no refills.  Patient will need to make appointment (it may be phone or video visit) before I will prescribe further refills.

## 2019-01-19 ENCOUNTER — Encounter: Payer: Self-pay | Admitting: *Deleted

## 2019-01-19 ENCOUNTER — Telehealth: Payer: Self-pay | Admitting: Neurology

## 2019-01-19 ENCOUNTER — Other Ambulatory Visit: Payer: Self-pay | Admitting: Neurology

## 2019-01-19 MED ORDER — VIMPAT 150 MG PO TABS: 1.0000 | ORAL_TABLET | Freq: Two times a day (BID) | ORAL | 0 refills | Status: DC

## 2019-01-19 NOTE — Telephone Encounter (Signed)
I never discontinued Vimpat.  As far as I am aware, he should already be on it.  I will refill it if he is out.  I previously referred him to psychiatry.  Did he ever make an appointment.  Psychiatry would be helpful in treating his depression.

## 2019-01-19 NOTE — Telephone Encounter (Signed)
I only sent in one prescription with no refills because I have not seen the patient in over a year.  He must make a follow up appointment with me for further refills.  Virtual visit is fine if that would be easier.

## 2019-01-19 NOTE — Progress Notes (Signed)
Harlin Rain (Key: [id number]) Rx #: [phone] Vimpat 150MG  tablets   Form [employer] Call-In Form  Plan Contact [phone] phone Created 37 minutes ago Sent to Plan Determination Call-in Form Please call the payer at [phone] to complete this PA.  I called and spoke to [facility] Call interaction ID  [id number] Approved 01/19/2019-01/14/2020 [phone]

## 2019-01-19 NOTE — Telephone Encounter (Signed)
Father is wanting to get patient back on the vimpat medication because he is acting depressed. Pharm on file is correct. 90 days supply usually.

## 2019-01-22 NOTE — Telephone Encounter (Signed)
I have left the patient 3 different VMs about appt with no return call. Thanks!

## 2019-01-25 ENCOUNTER — Encounter: Payer: Self-pay | Admitting: Neurology

## 2019-01-25 NOTE — Telephone Encounter (Signed)
I would recommend that we send a certified letter stating that patient must make follow up appointment for further refills.  Hoyle Sauer should be able to help out.

## 2019-01-26 NOTE — Telephone Encounter (Signed)
Sent certified letter-to pt address  [address]  [place] [address]

## 2019-02-01 ENCOUNTER — Other Ambulatory Visit: Payer: Self-pay | Admitting: Neurology

## 2019-02-02 NOTE — Telephone Encounter (Signed)
Requested Prescriptions   Pending Prescriptions Disp Refills  . ondansetron (ZOFRAN-ODT) 8 MG disintegrating tablet [Pharmacy Med Name: ONDANSETRON ODT 8 MG TABLET] 90 tablet 0    Sig: DISSOLVE 1 TABLET (8 MG TOTAL) BY MOUTH EVERY 8 (EIGHT) HOURS AS NEEDED FOR NAUSEA OR VOMITING.   Rx last filled: 12/18/18 #90 0 refills  Pt last seen: 11/24/17  Follow up appt scheduled:none   DENIED

## 2019-03-02 ENCOUNTER — Other Ambulatory Visit: Payer: Self-pay

## 2019-03-02 DIAGNOSIS — F05 Delirium due to known physiological condition: Secondary | ICD-10-CM

## 2019-03-02 NOTE — Progress Notes (Signed)
-----   Message ----- From: Carlynn Purl, CMA Sent: 02/22/2019   3:11 PM EST To: Pieter Partridge, DO, Clois Comber, CMA Subject: referral                                       Our office only has one provider that is accepting new patient and that doctor does not see patient with this type of issues.  The best thing for the patient to do is call their insurance company and find out who is in network with their insurance.but I have a few resources that you can try that maybe helpful:  Psychiatrist that treat delirium / memory issues in [place]:  [facility] [place],   [phone] [facility] [phone] [facility] [place] [address]. Mayme Genta [phone] Marcy Salvo, MD [phone] , phd  [phone] Dr Brayton Caves  [phone]

## 2019-03-02 NOTE — Progress Notes (Signed)
New referral place to Dr. Lynder Parents

## 2019-03-09 ENCOUNTER — Telehealth: Payer: Self-pay | Admitting: Neurology

## 2019-03-09 NOTE — Telephone Encounter (Signed)
Called to get information regarding refills needed. No answer unable to leave message to call office back will try back later.

## 2019-03-09 NOTE — Telephone Encounter (Signed)
Patient's father called needing to schedule a Virtual Visit with Dr. Tomi Likens. H was scheduled for the next available appointment. He said [facility] will need a refill before that appointment. Thank you

## 2019-03-10 ENCOUNTER — Other Ambulatory Visit: Payer: Self-pay | Admitting: Neurology

## 2019-03-12 NOTE — Telephone Encounter (Signed)
Requested Prescriptions   Pending Prescriptions Disp Refills  . VIMPAT 150 MG TABS [Pharmacy Med Name: VIMPAT 150 MG TABLET] 60 tablet 0    Sig: TAKE 1 TABLET BY MOUTH TWICE A DAY   Rx last filled:01/19/19 #60 0 refills  Pt last seen:11/24/17  Follow up appt scheduled:04/12/19

## 2019-03-12 NOTE — Telephone Encounter (Signed)
Received refill request from pharmacy. Rx is for Vimpat rx sent with no additional refills until patient is seen in office. Pt last seen over a year he has appt coming up next month. Dr. Tomi Likens approved just this once will no refills until seen in office.

## 2019-03-13 ENCOUNTER — Other Ambulatory Visit: Payer: Self-pay | Admitting: Neurology

## 2019-03-24 IMAGING — CT CT HEAD W/O CM
3 of 4 series · 15 of 47 positions shown, 18 images · non-contrast
Comparison: Prior MRI from 02/13/2015.

CLINICAL DATA: Initial evaluation for acute altered mental status.

EXAM:
CT HEAD WITHOUT CONTRAST
TECHNIQUE: Contiguous axial images were obtained from the base of the skull
through the vertex without intravenous contrast.

[Series 2: head w/o · axial · non-contrast · 0.43mm/px · z∈[+1642,+1762]mm · 9 of 32 slices shown, 12 images]
[im 4/32  brain]
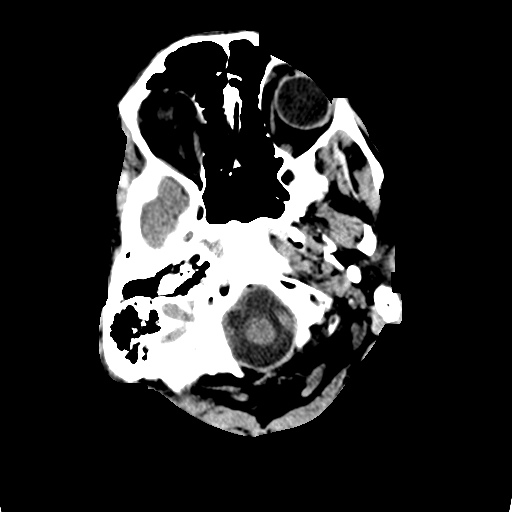
[im 4/32  bone]
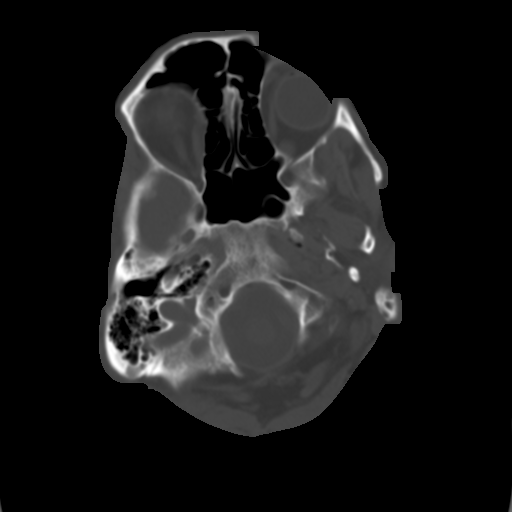
[im 7/32  brain]
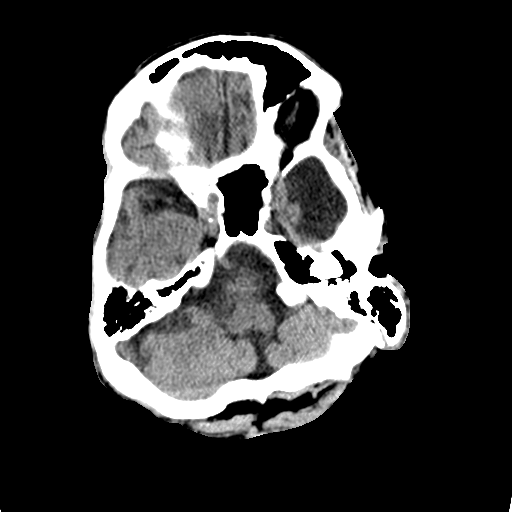
[im 10/32  brain]
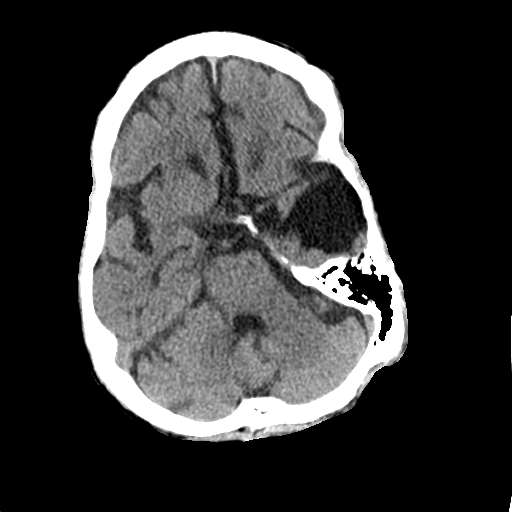
[im 13/32  brain]
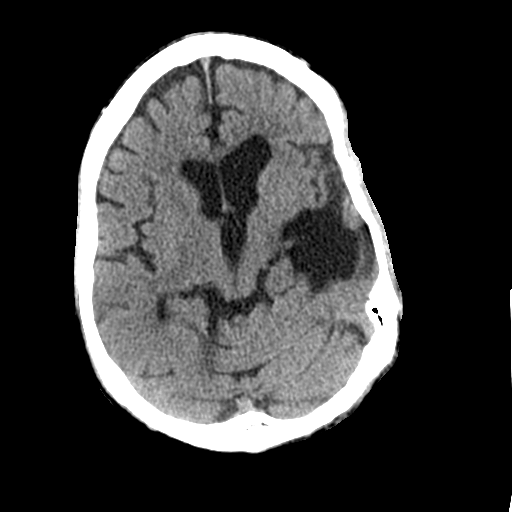
[im 16/32  brain]
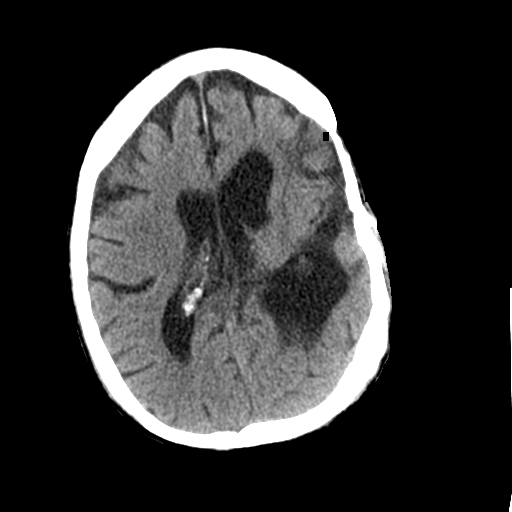
[im 16/32  bone]
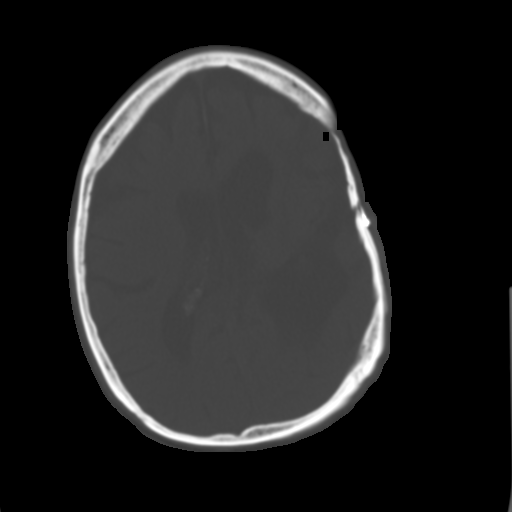
[im 19/32  brain]
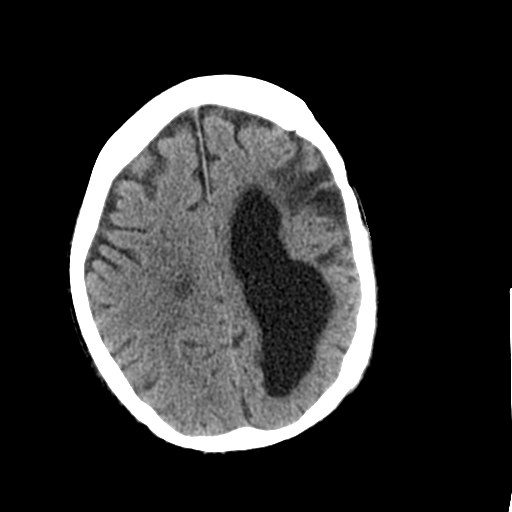
[im 22/32  brain]
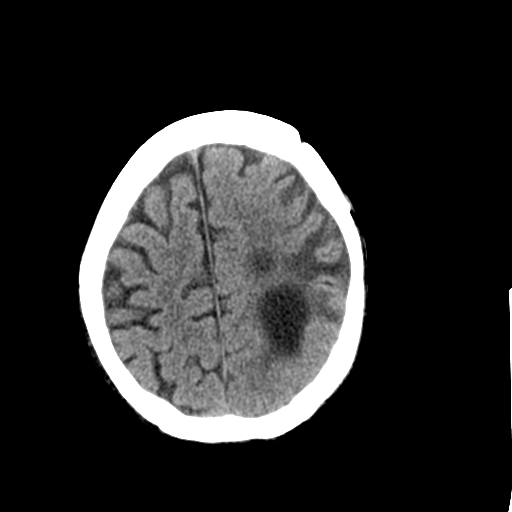
[im 25/32  brain]
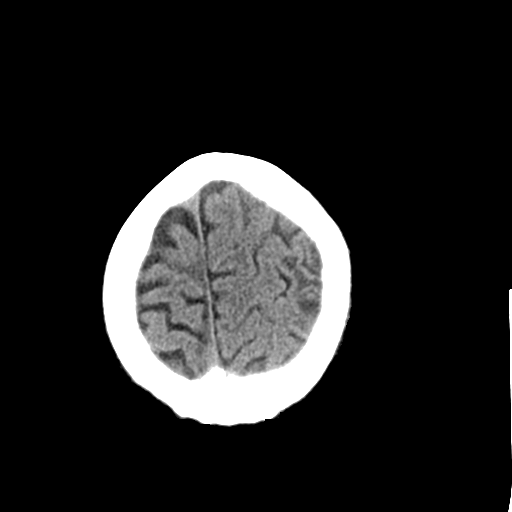
[im 28/32  brain]
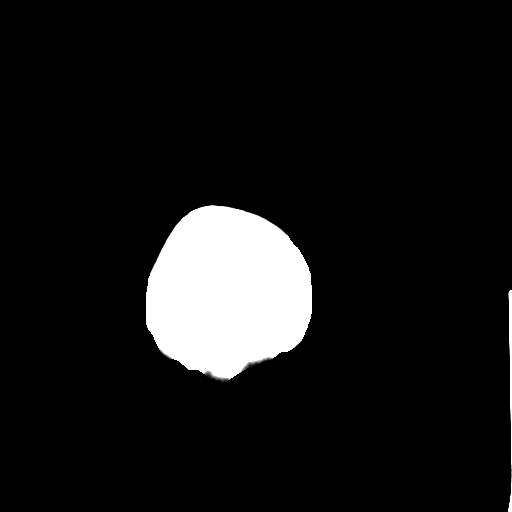
[im 28/32  bone]
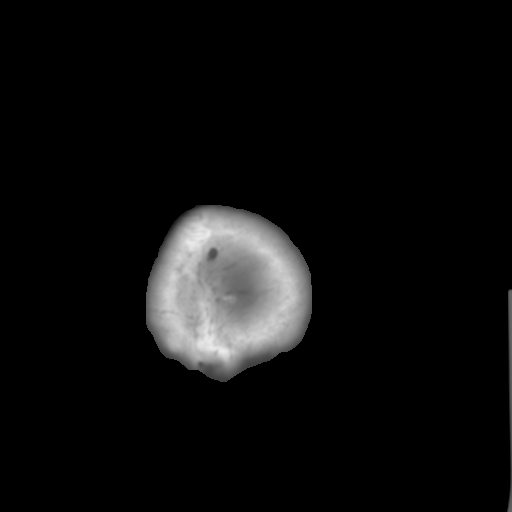

[Series 5: coronal · coronal · 0.31mm/px · 3 of 65 slices shown]
[im 22/65  brain]
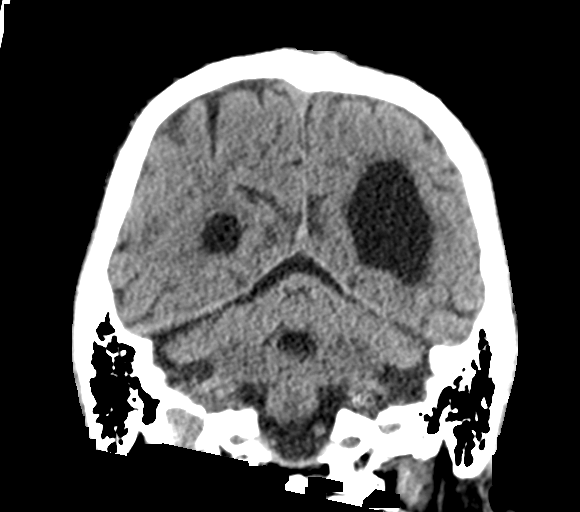
[im 29/65  brain]
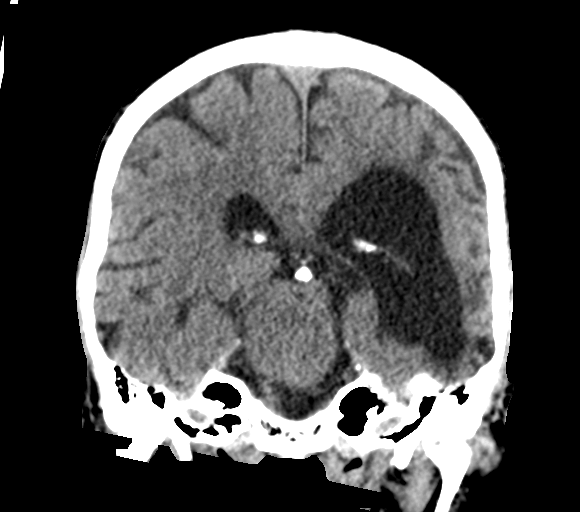
[im 36/65  brain]
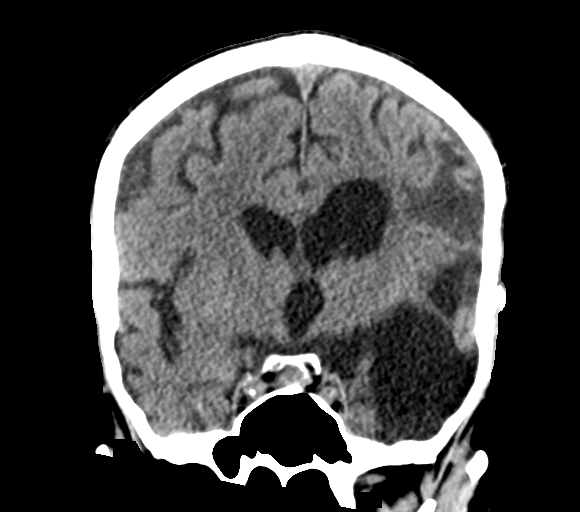

[Series 6: sagittal · sagittal · 0.31mm/px · 3 of 60 slices shown]
[im 25/60  brain]
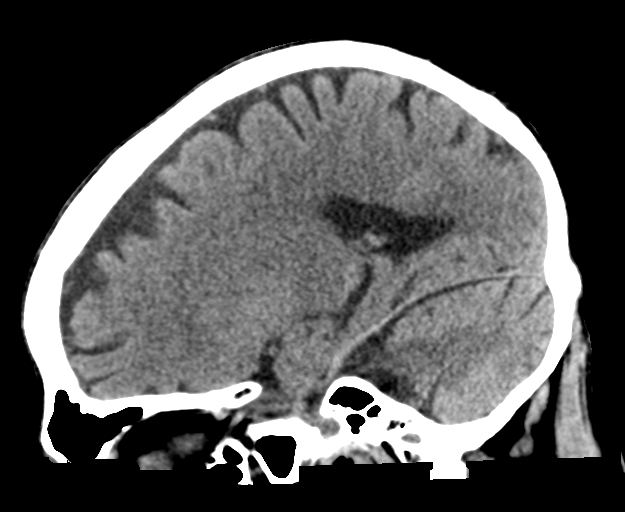
[im 30/60  brain]
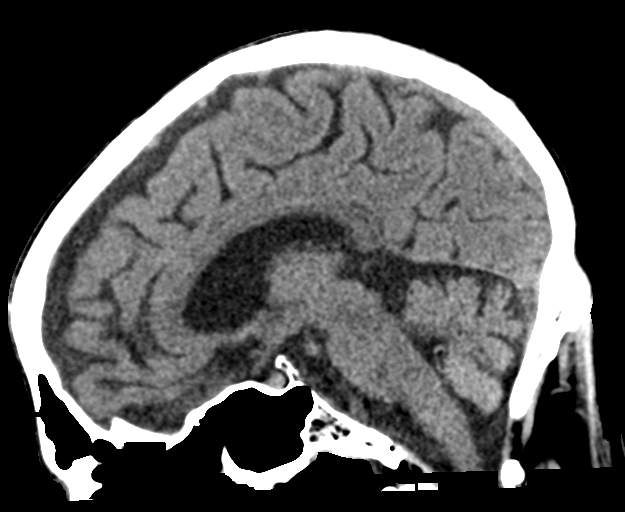
[im 35/60  brain]
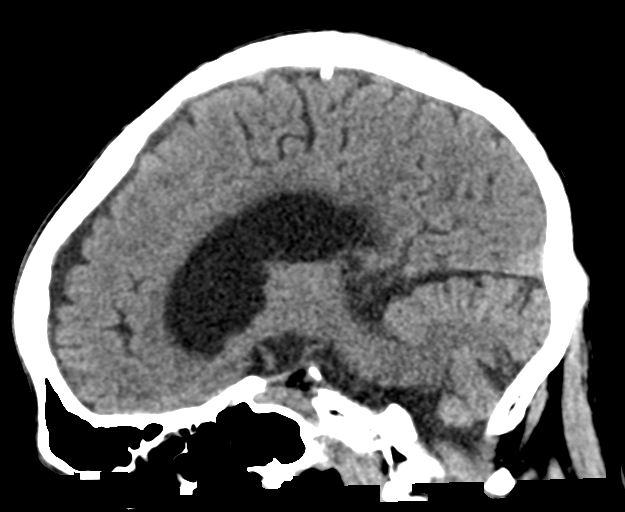

[15 of 47 positions shown; findings below may reference images not displayed]

FINDINGS: Brain: Chronic encephalomalacia within the left frontotemporal
region again seen, stable from previous. Associated ex vacuo
dilatation of the left lateral ventricle, also unchanged. No acute
intracranial hemorrhage. No evidence for acute large vessel
territory infarct. No mass lesion or midline shift. No mass effect.
No appreciable extra-axial fluid collection.

Vascular: No worrisome hyperdense vessel.

Skull: Scalp soft tissues demonstrate no acute abnormality.
Postoperative changes from prior left frontal craniotomy noted. No
acute calvarial fracture.

Sinuses/Orbits: Globes and orbital soft tissues within normal
limits. Visualized paranasal sinuses and mastoid air cells are
clear.

Other: None.
IMPRESSION: 1. No acute intracranial process.
2. Chronic left frontotemporal encephalomalacia with associated ex
vacuo dilatation of the left lateral ventricle, stable.
3. Sequelae of prior left frontal craniotomy, unchanged.

## 2019-04-10 ENCOUNTER — Other Ambulatory Visit: Payer: Self-pay | Admitting: Neurology

## 2019-04-11 ENCOUNTER — Encounter: Payer: Self-pay | Admitting: Neurology
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
## 2019-04-12 ENCOUNTER — Other Ambulatory Visit: Payer: Self-pay

## 2019-04-12 ENCOUNTER — Telehealth (INDEPENDENT_AMBULATORY_CARE_PROVIDER_SITE_OTHER): Payer: Medicaid Other | Admitting: Neurology

## 2019-04-12 ENCOUNTER — Encounter: Payer: Self-pay | Admitting: Neurology

## 2019-04-12 VITALS — Ht 68.0 in | Wt 135.0 lb

## 2019-04-12 DIAGNOSIS — G40209 Localization-related (focal) (partial) symptomatic epilepsy and epileptic syndromes with complex partial seizures, not intractable, without status epilepticus: Secondary | ICD-10-CM

## 2019-04-12 DIAGNOSIS — R4701 Aphasia: Secondary | ICD-10-CM

## 2019-04-12 DIAGNOSIS — S069X5D Unspecified intracranial injury with loss of consciousness greater than 24 hours with return to pre-existing conscious level, subsequent encounter: Secondary | ICD-10-CM

## 2019-04-12 MED ORDER — PRIMIDONE 50 MG PO TABS: 100.0000 mg | ORAL_TABLET | Freq: Every day | ORAL | 3 refills | Status: AC

## 2019-04-12 MED ORDER — OXCARBAZEPINE 600 MG PO TABS: 600.0000 mg | ORAL_TABLET | Freq: Two times a day (BID) | ORAL | 3 refills | Status: AC

## 2019-04-12 MED ORDER — VIMPAT 150 MG PO TABS: 1.0000 | ORAL_TABLET | Freq: Two times a day (BID) | ORAL | 3 refills | Status: DC

## 2019-04-12 MED ORDER — LEVETIRACETAM 500 MG PO TABS: 500.0000 mg | ORAL_TABLET | Freq: Two times a day (BID) | ORAL | 3 refills | Status: DC

## 2019-07-09 ENCOUNTER — Emergency Department (HOSPITAL_COMMUNITY): Payer: Medicaid Other

## 2019-07-09 ENCOUNTER — Inpatient Hospital Stay (HOSPITAL_COMMUNITY)
Admission: EM | Admit: 2019-07-09 | Discharge: 2019-07-12 | DRG: 100 | Disposition: A | Discharge status: Home / Self Care | Payer: Medicaid Other | Attending: Internal Medicine | Admitting: Internal Medicine

## 2019-07-09 ENCOUNTER — Inpatient Hospital Stay (HOSPITAL_COMMUNITY): Payer: Medicaid Other

## 2019-07-09 DIAGNOSIS — K257 Chronic gastric ulcer without hemorrhage or perforation: Secondary | ICD-10-CM | POA: Diagnosis present

## 2019-07-09 DIAGNOSIS — N179 Acute kidney failure, unspecified: Secondary | ICD-10-CM | POA: Diagnosis present

## 2019-07-09 DIAGNOSIS — R569 Unspecified convulsions: Secondary | ICD-10-CM

## 2019-07-09 DIAGNOSIS — E43 Unspecified severe protein-calorie malnutrition: Secondary | ICD-10-CM | POA: Diagnosis present

## 2019-07-09 DIAGNOSIS — Z8619 Personal history of other infectious and parasitic diseases: Secondary | ICD-10-CM

## 2019-07-09 DIAGNOSIS — Z20822 Contact with and (suspected) exposure to covid-19: Secondary | ICD-10-CM | POA: Diagnosis present

## 2019-07-09 DIAGNOSIS — R1310 Dysphagia, unspecified: Secondary | ICD-10-CM | POA: Diagnosis present

## 2019-07-09 DIAGNOSIS — F251 Schizoaffective disorder, depressive type: Secondary | ICD-10-CM | POA: Diagnosis not present

## 2019-07-09 DIAGNOSIS — D6959 Other secondary thrombocytopenia: Secondary | ICD-10-CM | POA: Diagnosis present

## 2019-07-09 DIAGNOSIS — F329 Major depressive disorder, single episode, unspecified: Secondary | ICD-10-CM | POA: Diagnosis present

## 2019-07-09 DIAGNOSIS — F1011 Alcohol abuse, in remission: Secondary | ICD-10-CM | POA: Diagnosis present

## 2019-07-09 DIAGNOSIS — E162 Hypoglycemia, unspecified: Secondary | ICD-10-CM | POA: Diagnosis not present

## 2019-07-09 DIAGNOSIS — R251 Tremor, unspecified: Secondary | ICD-10-CM | POA: Diagnosis present

## 2019-07-09 DIAGNOSIS — N182 Chronic kidney disease, stage 2 (mild): Secondary | ICD-10-CM | POA: Diagnosis present

## 2019-07-09 DIAGNOSIS — Z86718 Personal history of other venous thrombosis and embolism: Secondary | ICD-10-CM

## 2019-07-09 DIAGNOSIS — R29810 Facial weakness: Secondary | ICD-10-CM | POA: Diagnosis present

## 2019-07-09 DIAGNOSIS — R4701 Aphasia: Secondary | ICD-10-CM | POA: Diagnosis present

## 2019-07-09 DIAGNOSIS — R7401 Elevation of levels of liver transaminase levels: Secondary | ICD-10-CM | POA: Diagnosis not present

## 2019-07-09 DIAGNOSIS — Z9049 Acquired absence of other specified parts of digestive tract: Secondary | ICD-10-CM

## 2019-07-09 DIAGNOSIS — R6510 Systemic inflammatory response syndrome (SIRS) of non-infectious origin without acute organ dysfunction: Secondary | ICD-10-CM | POA: Diagnosis not present

## 2019-07-09 DIAGNOSIS — R509 Fever, unspecified: Secondary | ICD-10-CM | POA: Diagnosis not present

## 2019-07-09 DIAGNOSIS — K2950 Unspecified chronic gastritis without bleeding: Secondary | ICD-10-CM | POA: Diagnosis present

## 2019-07-09 DIAGNOSIS — K7030 Alcoholic cirrhosis of liver without ascites: Secondary | ICD-10-CM | POA: Diagnosis present

## 2019-07-09 DIAGNOSIS — K7460 Unspecified cirrhosis of liver: Secondary | ICD-10-CM | POA: Diagnosis not present

## 2019-07-09 DIAGNOSIS — Z87820 Personal history of traumatic brain injury: Secondary | ICD-10-CM

## 2019-07-09 DIAGNOSIS — Z87442 Personal history of urinary calculi: Secondary | ICD-10-CM

## 2019-07-09 DIAGNOSIS — J432 Centrilobular emphysema: Secondary | ICD-10-CM | POA: Diagnosis present

## 2019-07-09 DIAGNOSIS — Z8701 Personal history of pneumonia (recurrent): Secondary | ICD-10-CM

## 2019-07-09 DIAGNOSIS — R197 Diarrhea, unspecified: Secondary | ICD-10-CM

## 2019-07-09 DIAGNOSIS — Z79899 Other long term (current) drug therapy: Secondary | ICD-10-CM

## 2019-07-09 DIAGNOSIS — G40409 Other generalized epilepsy and epileptic syndromes, not intractable, without status epilepticus: Secondary | ICD-10-CM | POA: Diagnosis present

## 2019-07-09 DIAGNOSIS — Z87891 Personal history of nicotine dependence: Secondary | ICD-10-CM | POA: Diagnosis not present

## 2019-07-09 DIAGNOSIS — Z8349 Family history of other endocrine, nutritional and metabolic diseases: Secondary | ICD-10-CM

## 2019-07-09 DIAGNOSIS — G9389 Other specified disorders of brain: Secondary | ICD-10-CM | POA: Diagnosis present

## 2019-07-09 DIAGNOSIS — E872 Acidosis: Secondary | ICD-10-CM | POA: Diagnosis present

## 2019-07-09 DIAGNOSIS — S069X9S Unspecified intracranial injury with loss of consciousness of unspecified duration, sequela: Secondary | ICD-10-CM | POA: Diagnosis not present

## 2019-07-09 DIAGNOSIS — I493 Ventricular premature depolarization: Secondary | ICD-10-CM | POA: Diagnosis present

## 2019-07-09 DIAGNOSIS — Z888 Allergy status to other drugs, medicaments and biological substances status: Secondary | ICD-10-CM

## 2019-07-09 DIAGNOSIS — Z8249 Family history of ischemic heart disease and other diseases of the circulatory system: Secondary | ICD-10-CM

## 2019-07-09 DIAGNOSIS — K219 Gastro-esophageal reflux disease without esophagitis: Secondary | ICD-10-CM | POA: Diagnosis present

## 2019-07-09 DIAGNOSIS — D696 Thrombocytopenia, unspecified: Secondary | ICD-10-CM | POA: Diagnosis not present

## 2019-07-09 DIAGNOSIS — R7989 Other specified abnormal findings of blood chemistry: Secondary | ICD-10-CM

## 2019-07-09 DIAGNOSIS — Z6822 Body mass index (BMI) 22.0-22.9, adult: Secondary | ICD-10-CM

## 2019-07-09 LAB — CBC WITH DIFFERENTIAL/PLATELET
Abs Immature Granulocytes: 0.07 10*3/uL (ref 0.00–0.07)
Basophils Absolute: 0.1 10*3/uL (ref 0.0–0.1)
Basophils Relative: 1 %
Eosinophils Absolute: 0 10*3/uL (ref 0.0–0.5)
Eosinophils Relative: 0 %
HCT: 47.8 % (ref 39.0–52.0)
Hemoglobin: 15.2 g/dL (ref 13.0–17.0)
Immature Granulocytes: 1 %
Lymphocytes Relative: 19 %
Lymphs Abs: 1.4 10*3/uL (ref 0.7–4.0)
MCH: 36.6 pg — ABNORMAL HIGH (ref 26.0–34.0)
MCHC: 31.8 g/dL (ref 30.0–36.0)
MCV: 115.2 fL — ABNORMAL HIGH (ref 80.0–100.0)
Monocytes Absolute: 0.5 10*3/uL (ref 0.1–1.0)
Monocytes Relative: 7 %
Neutro Abs: 5.3 10*3/uL (ref 1.7–7.7)
Neutrophils Relative %: 72 %
Platelets: 70 10*3/uL — ABNORMAL LOW (ref 150–400)
RBC: 4.15 MIL/uL — ABNORMAL LOW (ref 4.22–5.81)
RDW: 17.2 % — ABNORMAL HIGH (ref 11.5–15.5)
WBC: 7.4 10*3/uL (ref 4.0–10.5)
nRBC: 1.2 % — ABNORMAL HIGH (ref 0.0–0.2)

## 2019-07-09 LAB — AMMONIA: Ammonia: 72 umol/L — ABNORMAL HIGH (ref 9–35)

## 2019-07-09 LAB — COMPREHENSIVE METABOLIC PANEL
ALT: 180 U/L — ABNORMAL HIGH (ref 0–44)
AST: 162 U/L — ABNORMAL HIGH (ref 15–41)
Albumin: 4.2 g/dL (ref 3.5–5.0)
Alkaline Phosphatase: 174 U/L — ABNORMAL HIGH (ref 38–126)
Anion gap: 34 — ABNORMAL HIGH (ref 5–15)
BUN: 9 mg/dL (ref 6–20)
CO2: 8 mmol/L — ABNORMAL LOW (ref 22–32)
Calcium: 9.3 mg/dL (ref 8.9–10.3)
Chloride: 99 mmol/L (ref 98–111)
Creatinine, Ser: 1.58 mg/dL — ABNORMAL HIGH (ref 0.61–1.24)
GFR calc Af Amer: 58 mL/min — ABNORMAL LOW (ref >60)
GFR calc non Af Amer: 50 mL/min — ABNORMAL LOW (ref >60)
Glucose, Bld: 164 mg/dL — ABNORMAL HIGH (ref 70–99)
Potassium: 4.1 mmol/L (ref 3.5–5.1)
Sodium: 141 mmol/L (ref 135–145)
Total Bilirubin: 1.5 mg/dL — ABNORMAL HIGH (ref 0.3–1.2)
Total Protein: 7.9 g/dL (ref 6.5–8.1)

## 2019-07-09 LAB — POCT I-STAT EG7
Acid-Base Excess: 2 mmol/L (ref 0.0–2.0)
Bicarbonate: 25.7 mmol/L (ref 20.0–28.0)
Calcium, Ion: 1.09 mmol/L — ABNORMAL LOW (ref 1.15–1.40)
HCT: 39 % (ref 39.0–52.0)
Hemoglobin: 13.3 g/dL (ref 13.0–17.0)
O2 Saturation: 76 %
Potassium: 4 mmol/L (ref 3.5–5.1)
Sodium: 139 mmol/L (ref 135–145)
TCO2: 27 mmol/L (ref 22–32)
pCO2, Ven: 37 mmHg — ABNORMAL LOW (ref 44.0–60.0)
pH, Ven: 7.451 — ABNORMAL HIGH (ref 7.250–7.430)
pO2, Ven: 39 mmHg (ref 32.0–45.0)

## 2019-07-09 LAB — SARS CORONAVIRUS 2 (TAT 6-24 HRS): SARS Coronavirus 2: NEGATIVE

## 2019-07-09 LAB — MAGNESIUM: Magnesium: 2.4 mg/dL (ref 1.7–2.4)

## 2019-07-09 LAB — PROTIME-INR
INR: 1 (ref 0.8–1.2)
Prothrombin Time: 13.1 seconds (ref 11.4–15.2)

## 2019-07-09 LAB — FOLATE: Folate: 1.9 ng/mL — ABNORMAL LOW (ref >5.9)

## 2019-07-09 LAB — CBG MONITORING, ED: Glucose-Capillary: 151 mg/dL — ABNORMAL HIGH (ref 70–99)

## 2019-07-09 LAB — VITAMIN B12: Vitamin B-12: 347 pg/mL (ref 180–914)

## 2019-07-09 LAB — PHOSPHORUS: Phosphorus: 2.8 mg/dL (ref 2.5–4.6)

## 2019-07-09 LAB — LACTIC ACID, PLASMA: Lactic Acid, Venous: 3.9 mmol/L (ref 0.5–1.9)

## 2019-07-09 LAB — HIV ANTIBODY (ROUTINE TESTING W REFLEX): HIV Screen 4th Generation wRfx: NONREACTIVE

## 2019-07-09 MED ORDER — LACOSAMIDE 50 MG PO TABS: 150.0000 mg | ORAL_TABLET | Freq: Two times a day (BID) | ORAL | Status: DC

## 2019-07-09 MED ORDER — QUETIAPINE FUMARATE 100 MG PO TABS: 100.0000 mg | ORAL_TABLET | ORAL | Status: DC

## 2019-07-09 MED ORDER — BROMOCRIPTINE MESYLATE 2.5 MG PO TABS
2.5000 mg | ORAL_TABLET | Freq: Once | ORAL | Status: DC
  Filled 2019-07-09: qty 1

## 2019-07-09 MED ORDER — QUETIAPINE FUMARATE 100 MG PO TABS
100.0000 mg | ORAL_TABLET | ORAL | Status: DC
  Administered 2019-07-10 – 2019-07-12 (×5): 100 mg via ORAL
  Filled 2019-07-09 (×4): qty 1

## 2019-07-09 MED ORDER — SODIUM CHLORIDE 0.9 % IV SOLN: 75.0000 mL/h | INTRAVENOUS | Status: DC

## 2019-07-09 MED ORDER — LEVETIRACETAM IN NACL 1000 MG/100ML IV SOLN
1000.0000 mg | Freq: Two times a day (BID) | INTRAVENOUS | Status: DC
  Administered 2019-07-10 – 2019-07-12 (×5): 1000 mg via INTRAVENOUS
  Filled 2019-07-09 (×5): qty 100

## 2019-07-09 MED ORDER — AMANTADINE HCL 50 MG/5ML PO SYRP: 50.0000 mg | ORAL_SOLUTION | Freq: Two times a day (BID) | ORAL | Status: DC

## 2019-07-09 MED ORDER — ENOXAPARIN SODIUM 40 MG/0.4ML ~~LOC~~ SOLN
40.0000 mg | SUBCUTANEOUS | Status: DC
  Administered 2019-07-09: 22:00:00 40 mg via SUBCUTANEOUS
  Filled 2019-07-09: qty 0.4

## 2019-07-09 MED ORDER — PANTOPRAZOLE SODIUM 40 MG PO TBEC: 40.0000 mg | DELAYED_RELEASE_TABLET | Freq: Every day | ORAL | Status: DC

## 2019-07-09 MED ORDER — QUETIAPINE FUMARATE 100 MG PO TABS
100.0000 mg | ORAL_TABLET | Freq: Once | ORAL | Status: AC
  Administered 2019-07-09: 16:00:00 100 mg via ORAL
  Filled 2019-07-09: qty 1

## 2019-07-09 MED ORDER — PRIMIDONE 50 MG PO TABS
100.0000 mg | ORAL_TABLET | Freq: Every day | ORAL | Status: DC
  Administered 2019-07-10 – 2019-07-12 (×2): 100 mg via ORAL
  Filled 2019-07-09 (×5): qty 2

## 2019-07-09 MED ORDER — LACTATED RINGERS IV BOLUS
1000.0000 mL | Freq: Once | INTRAVENOUS | Status: AC
  Administered 2019-07-09: 14:00:00 1000 mL via INTRAVENOUS

## 2019-07-09 MED ORDER — OXCARBAZEPINE 300 MG PO TABS
600.0000 mg | ORAL_TABLET | Freq: Two times a day (BID) | ORAL | Status: DC
  Administered 2019-07-10 – 2019-07-12 (×4): 600 mg via ORAL
  Filled 2019-07-09 (×4): qty 2

## 2019-07-09 MED ORDER — OXCARBAZEPINE 300 MG PO TABS: 600.0000 mg | ORAL_TABLET | Freq: Once | ORAL | Status: DC

## 2019-07-09 MED ORDER — THIAMINE HCL 100 MG PO TABS: 100.0000 mg | ORAL_TABLET | Freq: Every day | ORAL | Status: DC

## 2019-07-09 MED ORDER — PANTOPRAZOLE SODIUM 40 MG PO TBEC: 40.0000 mg | DELAYED_RELEASE_TABLET | Freq: Two times a day (BID) | ORAL | Status: DC

## 2019-07-09 MED ORDER — BROMOCRIPTINE MESYLATE 2.5 MG PO TABS
2.5000 mg | ORAL_TABLET | Freq: Two times a day (BID) | ORAL | Status: DC
  Administered 2019-07-10 – 2019-07-12 (×4): 2.5 mg via ORAL
  Filled 2019-07-09 (×8): qty 1

## 2019-07-09 MED ORDER — SODIUM CHLORIDE 0.9 % IV SOLN: INTRAVENOUS | Status: DC

## 2019-07-09 MED ORDER — QUETIAPINE FUMARATE 200 MG PO TABS
200.0000 mg | ORAL_TABLET | Freq: Every day | ORAL | Status: DC
  Administered 2019-07-10 – 2019-07-11 (×2): 200 mg via ORAL
  Filled 2019-07-09 (×3): qty 1

## 2019-07-09 MED ORDER — POLYETHYLENE GLYCOL 3350 17 G PO PACK: 17.0000 g | PACK | Freq: Every day | ORAL | Status: DC | PRN

## 2019-07-09 MED ORDER — LEVETIRACETAM IN NACL 1000 MG/100ML IV SOLN
1000.0000 mg | Freq: Once | INTRAVENOUS | Status: AC
  Administered 2019-07-09: 1000 mg via INTRAVENOUS
  Filled 2019-07-09: qty 100

## 2019-07-09 NOTE — H&P (Signed)
History and Physical    Hector [name] ION:[id number] DOB: Oct 14, 1970 DOA: 07/09/2019  PCP: Macy Mis, MD   Patient coming from: Home  I have personally briefly reviewed patient's old medical records in [facility]  Chief Complaint: Seizure  HPI: Hector [name] is a 49 y.o. male with medical history significant of seizure, TBI, Cirrhosis from previous EtOH abuse, presented with tonic clonic seizure. Pt has been taken care of by his father who was with the patient at bedside. Pt post-ictal very sleepy and confused, so Hx was from father.  Patient had a seizure with whole body shaking and eye rolling backwards on the floor. Woke up about few mins and then another one an hour and a half later.  Father reported that patient has not had a seizure for at least 4-5 years.  He had 2 of his seizure meds removed during the last visit a couple months ago at the neurologist office.  Otherwise has been compliant with meds.  Father also reported pt has been complains about epigastric pain after meal and diarrhea about one month. Pain last about 1 hour or 2, no night pain. Diarrhea was loose 2-3 episodes a day, no fever no chills, no weight loss.  EMS reports a third seizure in route and they had to give 2.5 mg versed. ED Course: CT head showing chronic changes, lab work unremarkable.  Review of Systems: Unable to perform, patient postictal and confused Past Medical History:  Diagnosis Date  . Cirrhosis (HCC)   . Depression   . DVT (deep venous thrombosis) (HCC)    UE, LE DVT during hospitalization  . GERD (gastroesophageal reflux disease)   . H/O ETOH abuse   . H/O renal calculi   . Seizure (HCC)   . TBI (traumatic brain injury) (HCC)    07/2013, fall from standing post seizure  . Tremor     Past Surgical History:  Procedure Laterality Date  . BRAIN SURGERY    . GASTROSTOMY TUBE PLACEMENT    . RADIOLOGY WITH ANESTHESIA N/A 04/20/2017   Procedure: MRI WITH ANESTHESIA OF THE BRAIN;   Surgeon: Radiologist, Medication, MD;  Location: [facility] OR;  Service: Radiology;  Laterality: N/A;  . REMOVAL OF GASTROSTOMY TUBE    . TRACHEOSTOMY     feinstein     reports that he quit smoking about 2 years ago. His smoking use included cigarettes. He has a 5.00 pack-year smoking history. He has never used smokeless tobacco. He reports current alcohol use. He reports that he does not use drugs.  Allergies  Allergen Reactions  . Ativan [Lorazepam] Other (See Comments)    Pt has paradoxical effect with sedating medications.   . Lactulose Hives    Family History  Problem Relation Age of Onset  . Hypercholesterolemia Mother   . Hypertension Mother   . Congestive Heart Failure Brother        Deceased age 96    Prior to Admission medications   Medication Sig Start Date End Date Taking? Authorizing Provider  Acetaminophen (M-PAP PO) Take 650 mg by mouth every 6 (six) hours as needed (Pain.).   Yes [provider]  amantadine (SYMMETREL) 50 MG/5ML solution Take 50 mg by mouth 2 (two) times daily.   Yes [provider]  bromocriptine (PARLODEL) 2.5 MG tablet Take 2.5 mg by mouth 2 (two) times daily.    Yes [provider]  fluocinonide cream (LIDEX) 0.05 % Apply 1 application topically See admin instructions.  Apply twice a day to hands and feet for 2 weeks, take 2 weeks off and repeat.   Yes [provider]  Lacosamide (VIMPAT) 150 MG TABS Take 1 tablet (150 mg total) by mouth 2 (two) times daily. 04/12/19  Yes Jaffe, Adam R, DO  levETIRAcetam (KEPPRA) 500 MG tablet Take 1 tablet (500 mg total) by mouth 2 (two) times daily. 04/12/19  Yes Jaffe, Adam R, DO  oxcarbazepine (TRILEPTAL) 600 MG tablet Take 1 tablet (600 mg total) by mouth 2 (two) times daily. 04/12/19  Yes Jaffe, Adam R, DO  pantoprazole (PROTONIX) 40 MG tablet Take 40 mg by mouth daily.    Yes [provider]  primidone (MYSOLINE) 50 MG tablet Take 2 tablets (100 mg total) by mouth at  bedtime. 04/12/19  Yes Jaffe, Adam R, DO  QUEtiapine (SEROQUEL) 100 MG tablet Take 100 mg by mouth See admin instructions. 100 MG AT 10 AM, 100 MG AT 2 PM, 200 MG AT BEDTIME 06/15/17  Yes [provider]  thiamine (VITAMIN B-1) 100 MG tablet TAKE 1 TABLET BY MOUTH EVERY DAY Patient taking differently: Take 100 mg by mouth daily.  12/07/18  Yes Jaffe, Adam R, DO  ondansetron (ZOFRAN-ODT) 8 MG disintegrating tablet Take 1 tablet (8 mg total) by mouth every 8 (eight) hours as needed for nausea or vomiting. Patient not taking: Reported on 04/11/2019 12/18/18   Pieter Partridge, DO  polyethylene glycol (MIRALAX / GLYCOLAX) packet Take 17 g by mouth daily as needed for moderate constipation.    [provider]    Physical Exam: Vitals:   07/09/19 1445 07/09/19 1500 07/09/19 1630 07/09/19 1656  BP: 129/83 133/72  121/81  Pulse: 100 93 96 (!) 102  Resp: (!) 25 (!) 24 19 (!) 21  Temp:      TempSrc:      SpO2:  95% 97% 97%  Weight:      Height:        Constitutional: NAD, calm, comfortable Vitals:   07/09/19 1445 07/09/19 1500 07/09/19 1630 07/09/19 1656  BP: 129/83 133/72  121/81  Pulse: 100 93 96 (!) 102  Resp: (!) 25 (!) 24 19 (!) 21  Temp:      TempSrc:      SpO2:  95% 97% 97%  Weight:      Height:       Eyes: PERRL, lids and conjunctivae normal ENMT: Mucous membranes are dry. Posterior pharynx clear of any exudate or lesions.Normal dentition.  Neck: normal, supple, no masses, no thyromegaly Respiratory: clear to auscultation bilaterally, no wheezing, no crackles. Normal respiratory effort. No accessory muscle use.  Cardiovascular: Regular rate and rhythm, no murmurs / rubs / gallops. No extremity edema. 2+ pedal pulses. No carotid bruits.  Abdomen: no tenderness, no masses palpated. No hepatosplenomegaly. Bowel sounds positive.  Musculoskeletal: no clubbing / cyanosis. No joint deformity upper and lower extremities. Good ROM, no contractures. Normal muscle tone.  Skin:  no rashes, lesions, ulcers. No induration Neurologic: Very sleepy, confused, moving all limbs, not following commands Psychiatric: Calm but very sleepy    Labs on Admission: I have personally reviewed following labs and imaging studies  CBC: Recent Labs  Lab 07/09/19 1400  WBC 7.4  NEUTROABS 5.3  HGB 15.2  HCT 47.8  MCV 115.2*  PLT 70*   Basic Metabolic Panel: Recent Labs  Lab 07/09/19 1400  NA 141  K 4.1  CL 99  CO2 8*  GLUCOSE 164*  BUN 9  CREATININE 1.58*  CALCIUM 9.3  MG 2.4   GFR: Estimated Creatinine Clearance: 50.2 mL/min (A) (by C-G formula based on SCr of 1.58 mg/dL (H)). Liver Function Tests: Recent Labs  Lab 07/09/19 1400  AST 162*  ALT 180*  ALKPHOS 174*  BILITOT 1.5*  PROT 7.9  ALBUMIN 4.2   No results for input(s): LIPASE, AMYLASE in the last 168 hours. No results for input(s): AMMONIA in the last 168 hours. Coagulation Profile: No results for input(s): INR, PROTIME in the last 168 hours. Cardiac Enzymes: No results for input(s): CKTOTAL, CKMB, CKMBINDEX, TROPONINI in the last 168 hours. BNP (last 3 results) No results for input(s): PROBNP in the last 8760 hours. HbA1C: No results for input(s): HGBA1C in the last 72 hours. CBG: Recent Labs  Lab 07/09/19 1409  GLUCAP 151*   Lipid Profile: No results for input(s): CHOL, HDL, LDLCALC, TRIG, CHOLHDL, LDLDIRECT in the last 72 hours. Thyroid Function Tests: No results for input(s): TSH, T4TOTAL, FREET4, T3FREE, THYROIDAB in the last 72 hours. Anemia Panel: No results for input(s): VITAMINB12, FOLATE, FERRITIN, TIBC, IRON, RETICCTPCT in the last 72 hours. Urine analysis:    Component Value Date/Time   COLORURINE YELLOW 07/14/2017 2045   APPEARANCEUR CLEAR 07/14/2017 2045   LABSPEC 1.011 07/14/2017 2045   PHURINE 5.0 07/14/2017 2045   GLUCOSEU NEGATIVE 07/14/2017 2045   HGBUR NEGATIVE 07/14/2017 2045   BILIRUBINUR NEGATIVE 07/14/2017 2045   KETONESUR 5 (A) 07/14/2017 2045    PROTEINUR NEGATIVE 07/14/2017 2045   UROBILINOGEN 1.0 02/12/2015 1912   NITRITE NEGATIVE 07/14/2017 2045   LEUKOCYTESUR NEGATIVE 07/14/2017 2045    Radiological Exams on Admission: CT HEAD WO CONTRAST  Result Date: 07/09/2019 CLINICAL DATA:  Seizure EXAM: CT HEAD WITHOUT CONTRAST TECHNIQUE: Contiguous axial images were obtained from the base of the skull through the vertex without intravenous contrast. COMPARISON:  July 13, 2017 FINDINGS: Brain: Postoperative changes noted on the left. There is extensive encephalomalacia throughout much of the left temporal lobe as well as involving portions of the left posterior frontal and left parietal lobes, stable. There is enlargement of the left lateral ventricle, particularly involving the atrium of the left lateral ventricle, an ex vacuo basis, stable. There is no demonstrable mass, hemorrhage, extra-axial fluid collection, or midline shift. Mild underlying atrophy is stable. No acute infarct is demonstrable. Vascular: No hyperdense vessel. There is slight vascular calcification in the carotid siphon regions. Skull: Postoperative changes are noted involving the left frontal and temporal bones, stable. No new bone lesions. Sinuses/Orbits: There is mucosal thickening in several ethmoid air cells. There is opacification of the visualized right maxillary antrum. There is rightward deviation of the nasal septum. Orbits appear symmetric bilaterally. Other: Mastoid air cells are clear. IMPRESSION: Extensive encephalomalacia on the left with asymmetric enlargement of the left lateral ventricle on an ex vacuo basis, stable. Mild underlying generalized atrophy. No acute infarct. No mass or hemorrhage. Postoperative bony changes involving portions of the left temporal and frontal bones Areas of paranasal sinus disease, primarily involving the right maxillary antrum. Deviated nasal septum. Electronically Signed   By: Bretta Bang III M.D.   On: 07/09/2019 15:41   DG  Chest Portable 1 View  Result Date: 07/09/2019 CLINICAL DATA:  Seizure. EXAM: PORTABLE CHEST 1 VIEW COMPARISON:  Chest radiograph 07/11/2017 FINDINGS: Heart size within normal limits. No evidence of airspace consolidation within the lungs. No evidence of pleural effusion or pneumothorax. Chronic elevation of the left hemidiaphragm. No acute bony abnormality. Overlying cardiac monitoring  leads. IMPRESSION: No evidence of acute cardiopulmonary abnormality. Electronically Signed   By: Jackey Loge DO   On: 07/09/2019 14:25    EKG: Independently reviewed.   Assessment/Plan Active Problems:   Seizure (HCC)  (please populate well all problems here in Problem List. (For example, if patient is on BP meds at home and you resume or decide to hold them, it is a problem that needs to be her. Same for CAD, COPD, HLD and so on)  Probably breakthrough seizure In December 2020 he had a neurology visit, quoting neurology note: " 1.  Continue oxcarbazepine 600mg  twice daily, levetiracetam 500mg  twice daily and lacosamide for seizure prevention. 2.  Continue primidone 100mg  at bedtime for tremor" Father said there was no medication changes since, but seems like pt has a new condition of diarrhea and abd pain. Somewhat concerning about malabsorption. I discussed with neurology Dr. , who ordered blood level of antiseizure medications, including Keppra because it was bolused in the ED. Further increased his Keppra dosage, and continue all other antiseizure medications and waiting for neurology updated recommendation. EEG was also ordered  Subacute diarrhea This past medical history indicated he has chronic gastritis, and postprandial epigastric clinic symptoms indicate recurrent gastritis/ulcer Increase his PPI and check stool antigen for H. Pylori -Another thought is pancreatitis, ordered a stool fat and elastase -Not to CT abdomen with contrast given his elevated creatinine level  Transaminitis Probably  related to the seizure, however given his prolonged history of cirrhosis, will order a RUQ ultrasound, his synthetic hepatic function seems good as albumin level within normal limits, will also send INR.  Tremors On bromocriptine   CKD stage II IVF and recheck  Cirrhosis RUQ U/S  Thrombocytopenia Probably from Cirrhosis, recheck in AM   DVT prophylaxis: Heparin SubQ Code Status: Full Family Communication: Father at bedside Disposition Plan: Home once seizure free for >24 hours Consults called: Talked to Dr. in office Admission status: Tele admit   MD [facility] Pager [phone]    07/09/2019, 5:50 PM

## 2019-07-09 NOTE — ED Provider Notes (Signed)
[facility] EMERGENCY DEPARTMENT Provider Note   CSN: [id number] Arrival date & time: 07/09/19  1350     History Chief Complaint  Patient presents with  . Seizures    Hector [name] is a 49 y.o. male.  HPI  49 year old male presents with 2 seizures.  History is taken from dad.  Patient had a seizure this morning and was found on the floor by dad.  Had another one an hour and a half later.  He has not had a seizure in a while.  He had 2 of his seizure meds removed during the last visit a couple months ago at the neurologist office.  Otherwise has been compliant with meds.  No recent illness such as fever.  EMS reports a third seizure in route and they had to give 2.5 mg versed.  Past Medical History:  Diagnosis Date  . Cirrhosis (HCC)   . Depression   . DVT (deep venous thrombosis) (HCC)    UE, LE DVT during hospitalization  . GERD (gastroesophageal reflux disease)   . H/O ETOH abuse   . H/O renal calculi   . Seizure (HCC)   . TBI (traumatic brain injury) (HCC)    07/2013, fall from standing post seizure  . Tremor     Patient Active Problem List   Diagnosis Date Noted  . Status post seizure 07/14/2017  . Agitation   . Aspiration pneumonia due to gastric secretions (HCC)   . Hypoxia   . Coarse tremors 04/18/2017  . ARF (acute renal failure) (HCC) 04/18/2017  . Hypokalemia   . Hypomagnesemia   . Physical deconditioning   . FTT (failure to thrive) in adult 02/25/2017  . Oropharyngeal dysphagia 01/01/2017  . Orthostatic hypotension 01/01/2017  . Failure to thrive in adult 12/31/2016  . Vomiting   . Seizures (HCC) 06/01/2015  . Alcoholic cirrhosis of liver without ascites (HCC)   . Dehydration   . Encephalopathy acute 02/13/2015  . Altered mental status   . Ataxia 02/12/2015  . Localization-related symptomatic epilepsy and epileptic syndromes with complex partial seizures, not intractable, without status epilepticus (HCC) 08/16/2014  . Tobacco  abuse 08/16/2014  . Acute kidney injury superimposed on CKD (HCC) 06/25/2014  . Dysphagia 06/25/2014  . Centrilobular emphysema (HCC) 01/27/2014  . Tracheostomy in place [facility]) 01/27/2014  . Encounter for family conference with patient present 01/23/2014  . Dvt femoral (deep venous thrombosis) (HCC) 01/05/2014  . Protein-calorie malnutrition, severe (HCC) 01/05/2014  . Alcohol abuse 01/05/2014  . Acute respiratory failure with hypoxia (HCC) 12/20/2013  . Delirium due to general medical condition 12/13/2013  . HCAP (healthcare-associated pneumonia) 12/13/2013  . SIRS (systemic inflammatory response syndrome) (HCC) 12/13/2013  . TBI (traumatic brain injury) (HCC) 12/12/2013    Past Surgical History:  Procedure Laterality Date  . BRAIN SURGERY    . GASTROSTOMY TUBE PLACEMENT    . RADIOLOGY WITH ANESTHESIA N/A 04/20/2017   Procedure: MRI WITH ANESTHESIA OF THE BRAIN;  Surgeon: Radiologist, Medication, MD;  Location: [facility] OR;  Service: Radiology;  Laterality: N/A;  . REMOVAL OF GASTROSTOMY TUBE    . TRACHEOSTOMY     feinstein       Family History  Problem Relation Age of Onset  . Hypercholesterolemia Mother   . Hypertension Mother   . Congestive Heart Failure Brother        Deceased age 37    Social History   Tobacco Use  . Smoking status: Former Smoker    Packs/day:  0.25    Years: 20.00    Pack years: 5.00    Types: Cigarettes    Quit date: 04/17/2017    Years since quitting: 2.2  . Smokeless tobacco: Never Used  Substance Use Topics  . Alcohol use: Yes    Alcohol/week: 0.0 standard drinks  . Drug use: No    Home Medications Prior to Admission medications   Medication Sig Start Date End Date Taking? Authorizing Provider  Acetaminophen (M-PAP PO) Take 650 mg by mouth every 6 (six) hours as needed (Pain.).   Yes [provider]  amantadine (SYMMETREL) 50 MG/5ML solution Take 50 mg by mouth 2 (two) times daily.   Yes [provider]  bromocriptine  (PARLODEL) 2.5 MG tablet Take 2.5 mg by mouth 2 (two) times daily.    Yes [provider]  fluocinonide cream (LIDEX) 7.82 % Apply 1 application topically See admin instructions. Apply twice a day to hands and feet for 2 weeks, take 2 weeks off and repeat.   Yes [provider]  Lacosamide (VIMPAT) 150 MG TABS Take 1 tablet (150 mg total) by mouth 2 (two) times daily. 04/12/19  Yes Jaffe, Adam R, DO  levETIRAcetam (KEPPRA) 500 MG tablet Take 1 tablet (500 mg total) by mouth 2 (two) times daily. 04/12/19  Yes Jaffe, Adam R, DO  oxcarbazepine (TRILEPTAL) 600 MG tablet Take 1 tablet (600 mg total) by mouth 2 (two) times daily. 04/12/19  Yes Jaffe, Adam R, DO  pantoprazole (PROTONIX) 40 MG tablet Take 40 mg by mouth daily.    Yes [provider]  primidone (MYSOLINE) 50 MG tablet Take 2 tablets (100 mg total) by mouth at bedtime. 04/12/19  Yes Jaffe, Adam R, DO  QUEtiapine (SEROQUEL) 100 MG tablet Take 100 mg by mouth See admin instructions. 100 MG AT 10 AM, 100 MG AT 2 PM, 200 MG AT BEDTIME 06/15/17  Yes [provider]  thiamine (VITAMIN B-1) 100 MG tablet TAKE 1 TABLET BY MOUTH EVERY DAY Patient taking differently: Take 100 mg by mouth daily.  12/07/18  Yes Jaffe, Adam R, DO  ondansetron (ZOFRAN-ODT) 8 MG disintegrating tablet Take 1 tablet (8 mg total) by mouth every 8 (eight) hours as needed for nausea or vomiting. Patient not taking: Reported on 04/11/2019 12/18/18   Pieter Partridge, DO  polyethylene glycol (MIRALAX / GLYCOLAX) packet Take 17 g by mouth daily as needed for moderate constipation.    [provider]    Allergies    Ativan [lorazepam] and Lactulose  Review of Systems   Review of Systems  Unable to perform ROS: Patient nonverbal    Physical Exam Updated Vital Signs BP 133/72   Pulse 93   Temp 98.2 F (36.8 C) (Rectal)   Resp (!) 24   Ht 5\' 6"  (1.676 m)   Wt 63.5 kg   SpO2 95%   BMI 22.60 kg/m   Physical Exam Vitals and  nursing note reviewed.  Constitutional:      Appearance: He is well-developed.  HENT:     Head: Normocephalic and atraumatic.     Right Ear: External ear normal.     Left Ear: External ear normal.     Nose: Nose normal.  Eyes:     General:        Right eye: No discharge.        Left eye: No discharge.     Pupils: Pupils are equal, round, and reactive to light.  Cardiovascular:  Rate and Rhythm: Regular rhythm. Tachycardia present.     Heart sounds: Normal heart sounds.  Pulmonary:     Effort: Pulmonary effort is normal. Tachypnea present.     Breath sounds: Normal breath sounds.  Abdominal:     Palpations: Abdomen is soft.     Tenderness: There is no abdominal tenderness.  Musculoskeletal:     Cervical back: Neck supple.  Skin:    General: Skin is warm and dry.  Neurological:     Mental Status: He is alert.     Comments: Awake, but does not follow commands.   Psychiatric:        Mood and Affect: Mood is not anxious.     ED Results / Procedures / Treatments   Labs (all labs ordered are listed, but only abnormal results are displayed) Labs Reviewed  CBC WITH DIFFERENTIAL/PLATELET - Abnormal; Notable for the following components:      Result Value   RBC 4.15 (*)    MCV 115.2 (*)    MCH 36.6 (*)    RDW 17.2 (*)    Platelets 70 (*)    nRBC 1.2 (*)    All other components within normal limits  COMPREHENSIVE METABOLIC PANEL - Abnormal; Notable for the following components:   CO2 8 (*)    Glucose, Bld 164 (*)    Creatinine, Ser 1.58 (*)    AST 162 (*)    ALT 180 (*)    Alkaline Phosphatase 174 (*)    Total Bilirubin 1.5 (*)    GFR calc non Af Amer 50 (*)    GFR calc Af Amer 58 (*)    Anion gap 34 (*)    All other components within normal limits  CBG MONITORING, ED - Abnormal; Notable for the following components:   Glucose-Capillary 151 (*)    All other components within normal limits  URINE CULTURE  SARS CORONAVIRUS 2 (TAT 6-24 HRS)  MAGNESIUM  URINALYSIS,  ROUTINE W REFLEX MICROSCOPIC  LACTIC ACID, PLASMA  LACTIC ACID, PLASMA  PROTIME-INR    EKG EKG Interpretation  Date/Time:  Monday July 09 2019 13:50:20 EDT Ventricular Rate:  118 PR Interval:    QRS Duration: 84 QT Interval:  327 QTC Calculation: 459 R Axis:   72 Text Interpretation: Sinus tachycardia Ventricular premature complex Aberrant complex rate is faster compared to 2019 Confirmed by Pricilla Loveless [phone]) on 07/09/2019 2:46:59 PM   Radiology CT HEAD WO CONTRAST  Result Date: 07/09/2019 CLINICAL DATA:  Seizure EXAM: CT HEAD WITHOUT CONTRAST TECHNIQUE: Contiguous axial images were obtained from the base of the skull through the vertex without intravenous contrast. COMPARISON:  July 13, 2017 FINDINGS: Brain: Postoperative changes noted on the left. There is extensive encephalomalacia throughout much of the left temporal lobe as well as involving portions of the left posterior frontal and left parietal lobes, stable. There is enlargement of the left lateral ventricle, particularly involving the atrium of the left lateral ventricle, an ex vacuo basis, stable. There is no demonstrable mass, hemorrhage, extra-axial fluid collection, or midline shift. Mild underlying atrophy is stable. No acute infarct is demonstrable. Vascular: No hyperdense vessel. There is slight vascular calcification in the carotid siphon regions. Skull: Postoperative changes are noted involving the left frontal and temporal bones, stable. No new bone lesions. Sinuses/Orbits: There is mucosal thickening in several ethmoid air cells. There is opacification of the visualized right maxillary antrum. There is rightward deviation of the nasal septum. Orbits appear symmetric bilaterally. Other: Mastoid air  cells are clear. IMPRESSION: Extensive encephalomalacia on the left with asymmetric enlargement of the left lateral ventricle on an ex vacuo basis, stable. Mild underlying generalized atrophy. No acute infarct. No mass or  hemorrhage. Postoperative bony changes involving portions of the left temporal and frontal bones Areas of paranasal sinus disease, primarily involving the right maxillary antrum. Deviated nasal septum. Electronically Signed   By: Bretta Bang III M.D.   On: 07/09/2019 15:41   DG Chest Portable 1 View  Result Date: 07/09/2019 CLINICAL DATA:  Seizure. EXAM: PORTABLE CHEST 1 VIEW COMPARISON:  Chest radiograph 07/11/2017 FINDINGS: Heart size within normal limits. No evidence of airspace consolidation within the lungs. No evidence of pleural effusion or pneumothorax. Chronic elevation of the left hemidiaphragm. No acute bony abnormality. Overlying cardiac monitoring leads. IMPRESSION: No evidence of acute cardiopulmonary abnormality. Electronically Signed   By: Jackey Loge DO   On: 07/09/2019 14:25    Procedures Procedures (including critical care time)  Medications Ordered in ED Medications  QUEtiapine (SEROQUEL) tablet 100 mg (has no administration in time range)  levETIRAcetam (KEPPRA) IVPB 1000 mg/100 mL premix (0 mg Intravenous Stopped 07/09/19 1514)  lactated ringers bolus 1,000 mL (0 mLs Intravenous Stopped 07/09/19 1616)    ED Course  I have reviewed the triage vital signs and the nursing notes.  Pertinent labs & imaging results that were available during my care of the patient were reviewed by me and considered in my medical decision making (see chart for details).    MDM Rules/Calculators/A&P                      Patient is afebrile.  White blood cell count is normal.  My suspicion is this is breakthrough seizures but at this point there is no obvious infection.  Will need to check urine.  He does have an impressive anion gap acidosis this is likely from multiple seizures.  Will check lactate and VBG.  Discussed with Dr. Amada Jupiter who will consult and recommends admission to hospitalist service.  Loaded with IV Keppra. Final Clinical Impression(s) / ED Diagnoses Final  diagnoses:  Seizure [facility])    Rx / DC Orders ED Discharge Orders    None       Pricilla Loveless, MD 07/09/19 [phone]

## 2019-07-09 NOTE — ED Triage Notes (Signed)
Pt arrives via [facility] from home where dad states he had 2 tonic clonic seizures lasting approx 10 minutes each. Had another sz in EMS vehicle lasting approx 2 minutes and given 2.5mg  versed with good resolution. Per father, pt was back to baseline prior to leaving home. Takes Keppra for sz and was taken off klonopin in December.

## 2019-07-09 NOTE — Consult Note (Signed)
Neurology Consultation  Reason for Consult: Seizure Referring Physician: Roosevelt Locks  CC: Seizure  History is obtained from: Chart  HPI: Hector [name] is a 49 y.o. male with history of tremor, TBI, seizures, alcohol abuse, DVT and cirrhosis.  Patient currently lives with his father.  Per father patient had a seizure this morning and was found on the floor.  The seizure lasted for about 15 minutes per father.  An hour and half later patient was also noted to have a seizure by father and that is when EMS was called.  In previous notes states that he has had 2 seizure medications removed during the last visit however looking at Dr. Georgie Chard notes he has not had 2 seizure medications removed and was instructed to continue with his seizure medications which include oxcarbazepine 600 mg twice daily, Keppra 500 mg twice daily and Vimpat 150 mg twice daily.  On route with EMS patient had a third seizure twitch he was given 2.5 mg Versed.  Currently patient is in room not showing signs of any epileptiform activity.  Father is at bedside and states that he has been taking his medications on a daily basis as prescribed.  Per father patient has not had a seizure for quite a while.  Currently patient is aphasic thus he cannot give me any information.  I tried to get an exam however he appears to still be slightly postictal and will not participate in exam.  ED course  Relevant labs include -AST 162, ALT 180, alk phos 174, urinalysis and culture pending, platelets 70,  CT head shows-extensive encephalomalacia on the left with asymmetric enlargement of the left lateral ventricle on an ex vacuo basis, stable.  No mass or hemorrhage  Chest x-ray shows no evidence of acute cardiopulmonary abnormality  Chart review (patient is followed by Dr. Tomi Likens for patient seizure disorder)  Past Medical History:  Diagnosis Date  . Cirrhosis ([place])   . Depression   . DVT (deep venous thrombosis) ([facility])    UE, LE DVT during  hospitalization  . GERD (gastroesophageal reflux disease)   . H/O ETOH abuse   . H/O renal calculi   . Seizure ([place])   . TBI (traumatic brain injury) ([place])    07/2013, fall from standing post seizure  . Tremor     Family History  Problem Relation Age of Onset  . Hypercholesterolemia Mother   . Hypertension Mother   . Congestive Heart Failure Brother        Deceased age 26   Social History:   reports that he quit smoking about 2 years ago. His smoking use included cigarettes. He has a 5.00 pack-year smoking history. He has never used smokeless tobacco. He reports current alcohol use. He reports that he does not use drugs.  Medications  Current Facility-Administered Medications:  .  0.9 %  sodium chloride infusion, 75 mL/hr, Intravenous, Continuous, Zhang, Ping T, MD .  0.9 %  sodium chloride infusion, , Intravenous, Continuous, Zhang, Pearletha Forge T, MD .  amantadine (SYMMETREL) 50 MG/5ML solution 50 mg, 50 mg, Oral, BID, Wynetta Fines T, MD .  bromocriptine (PARLODEL) tablet 2.5 mg, 2.5 mg, Oral, BID, Zhang, Ping T, MD .  enoxaparin (LOVENOX) injection 40 mg, 40 mg, Subcutaneous, Q24H, Zhang, Ping T, MD .  Lacosamide TABS 150 mg, 1 tablet, Oral, BID, Wynetta Fines T, MD .  Derrill Memo ON 07/10/2019] levETIRAcetam (KEPPRA) IVPB 1000 mg/100 mL premix, 1,000 mg, Intravenous, Q12H, Lequita Halt, MD .  Oxcarbazepine (  TRILEPTAL) tablet 600 mg, 600 mg, Oral, BID, Zhang, Ping T, MD .  pantoprazole (PROTONIX) EC tablet 40 mg, 40 mg, Oral, Daily, Zhang, Ping T, MD .  polyethylene glycol (MIRALAX / GLYCOLAX) packet 17 g, 17 g, Oral, Daily PRN, Wynetta Fines T, MD .  primidone (MYSOLINE) tablet 100 mg, 100 mg, Oral, QHS, Zhang, Ping T, MD .  QUEtiapine (SEROQUEL) tablet 100 mg, 100 mg, Oral, See admin instructions, Wynetta Fines T, MD .  thiamine tablet 100 mg, 100 mg, Oral, Daily, Lequita Halt, MD  Current Outpatient Medications:  .  Acetaminophen (M-PAP PO), Take 650 mg by mouth every 6 (six) hours as needed  (Pain.)., Disp: , Rfl:  .  amantadine (SYMMETREL) 50 MG/5ML solution, Take 50 mg by mouth 2 (two) times daily., Disp: , Rfl:  .  bromocriptine (PARLODEL) 2.5 MG tablet, Take 2.5 mg by mouth 2 (two) times daily. , Disp: , Rfl:  .  fluocinonide cream (LIDEX) 2.48 %, Apply 1 application topically See admin instructions. Apply twice a day to hands and feet for 2 weeks, take 2 weeks off and repeat., Disp: , Rfl:  .  Lacosamide (VIMPAT) 150 MG TABS, Take 1 tablet (150 mg total) by mouth 2 (two) times daily., Disp: 180 tablet, Rfl: 3 .  levETIRAcetam (KEPPRA) 500 MG tablet, Take 1 tablet (500 mg total) by mouth 2 (two) times daily., Disp: 180 tablet, Rfl: 3 .  oxcarbazepine (TRILEPTAL) 600 MG tablet, Take 1 tablet (600 mg total) by mouth 2 (two) times daily., Disp: 180 tablet, Rfl: 3 .  pantoprazole (PROTONIX) 40 MG tablet, Take 40 mg by mouth daily. , Disp: , Rfl:  .  primidone (MYSOLINE) 50 MG tablet, Take 2 tablets (100 mg total) by mouth at bedtime., Disp: 180 tablet, Rfl: 3 .  QUEtiapine (SEROQUEL) 100 MG tablet, Take 100 mg by mouth See admin instructions. 100 MG AT 10 AM, 100 MG AT 2 PM, 200 MG AT BEDTIME, Disp: , Rfl: 3 .  thiamine (VITAMIN B-1) 100 MG tablet, TAKE 1 TABLET BY MOUTH EVERY DAY (Patient taking differently: Take 100 mg by mouth daily. ), Disp: 30 tablet, Rfl: 5 .  ondansetron (ZOFRAN-ODT) 8 MG disintegrating tablet, Take 1 tablet (8 mg total) by mouth every 8 (eight) hours as needed for nausea or vomiting. (Patient not taking: Reported on 04/11/2019), Disp: 90 tablet, Rfl: 0 .  polyethylene glycol (MIRALAX / GLYCOLAX) packet, Take 17 g by mouth daily as needed for moderate constipation., Disp: , Rfl:   ROS:  Unable to obtain due to aphasia.    Exam: Current vital signs: BP 121/81   Pulse (!) 102   Temp 98.2 F (36.8 C) (Rectal)   Resp (!) 21   Ht 5' 6"  (1.676 m)   Wt 63.5 kg   SpO2 97%   BMI 22.60 kg/m  Vital signs in last 24 hours: Temp:  [98.2 F (36.8 C)-98.8 F (37.1  C)] 98.2 F (36.8 C) (03/15 1430) Pulse Rate:  [93-120] 102 (03/15 1656) Resp:  [19-29] 21 (03/15 1656) BP: (121-144)/(72-96) 121/81 (03/15 1656) SpO2:  [93 %-98 %] 97 % (03/15 1656) Weight:  [63.5 kg] 63.5 kg (03/15 1357)   Constitutional: Appears thin with decreased muscle mass.  Eyes: No scleral injection HENT: No OP obstrucion Head: Normocephalic.  Cardiovascular: Normal rate and regular rhythm.  Respiratory: Effort normal, non-labored breathing GI: Soft.  No distension. There is no tenderness.  Skin: WDI  Neuro: Mental Status: Patient is awake, having significant  trouble following both verbal and visual commands Speech-mute Cranial Nerves: II: Blinks to threat on the left III,IV, VI: EOMI without ptosis or diploplia. Pupils equal, round and reactive to light V: Facial sensation is symmetric to temperature VII: Right lower facial droop which is baseline VIII: hearing is intact to voice X: Unable to evaluate due to patient's agitation and not cooperating with exam XI: Unable to evaluate due to patient's agitation not cooperating with exam XII: Unable to evaluate due to patient's agitation and not cooperating with exam Motor: Creased tone throughout however the right upper extremity is greater than other extremities Sensory: Withdraws to noxious stimuli in all 4 quadrants Deep Tendon Reflexes: 2+ and symmetric in the biceps I cannot elicit any DTRs in the knee jerk and or Achilles Plantars: Mute bilaterally  Labs I have reviewed labs in epic and the results pertinent to this consultation are:   CBC    Component Value Date/Time   WBC 7.4 07/09/2019 1400   RBC 4.15 (L) 07/09/2019 1400   HGB 15.2 07/09/2019 1400   HCT 47.8 07/09/2019 1400   HCT NOLAV 07/14/2017 1657   PLT 70 (L) 07/09/2019 1400   MCV 115.2 (H) 07/09/2019 1400   MCH 36.6 (H) 07/09/2019 1400   MCHC 31.8 07/09/2019 1400   RDW 17.2 (H) 07/09/2019 1400   LYMPHSABS 1.4 07/09/2019 1400   MONOABS 0.5  07/09/2019 1400   EOSABS 0.0 07/09/2019 1400   BASOSABS 0.1 07/09/2019 1400    CMP     Component Value Date/Time   NA 141 07/09/2019 1400   K 4.1 07/09/2019 1400   CL 99 07/09/2019 1400   CO2 8 (L) 07/09/2019 1400   GLUCOSE 164 (H) 07/09/2019 1400   BUN 9 07/09/2019 1400   CREATININE 1.58 (H) 07/09/2019 1400   CALCIUM 9.3 07/09/2019 1400   PROT 7.9 07/09/2019 1400   ALBUMIN 4.2 07/09/2019 1400   AST 162 (H) 07/09/2019 1400   ALT 180 (H) 07/09/2019 1400   ALKPHOS 174 (H) 07/09/2019 1400   BILITOT 1.5 (H) 07/09/2019 1400   GFRNONAA 50 (L) 07/09/2019 1400   GFRAA 58 (L) 07/09/2019 1400    Lipid Panel  No results found for: CHOL, TRIG, HDL, CHOLHDL, VLDL, LDLCALC, LDLDIRECT   Imaging I have reviewed the images obtained:  CT-scan of the brain-extensive encephalomalacia on the left with asymmetric enlargement of the left lateral ventricle on an ex vacuo basis, stable mild underlying generalized atrophy.  No acute infarct.  No mass or hemorrhage.   Etta Quill PA-C [facility] [phone]  M-F  (9:00 am- 5:00 PM)  07/09/2019, 5:14 PM   I have seen and evaluated the patient. I have reviewed the above note.   Assessment:  49 year old male presented to the hospital with 3 breakthrough seizures.  Lives with father who states he has been getting his medications on time and has not missed any doses.  At this point no source of infection has been obtained.  I do wonder with his diarrhea if he may be having some malabsorption given how well controlled he has been in the past.  Impression: -Breakthrough seizure  Recommendations: -Seizure precautions -EEG -Increase Keppra to 1000 mg twice daily - Primidone, oxcarb and vimpat levels - Neurology to follow  Roland Rack, MD [facility] [phone]  If 7pm- 7am, please page neurology on call as listed in [place].

## 2019-07-10 ENCOUNTER — Inpatient Hospital Stay (HOSPITAL_COMMUNITY): Payer: Medicaid Other

## 2019-07-10 LAB — COMPREHENSIVE METABOLIC PANEL
ALT: 118 U/L — ABNORMAL HIGH (ref 0–44)
AST: 629 U/L — ABNORMAL HIGH (ref 15–41)
Albumin: 3.3 g/dL — ABNORMAL LOW (ref 3.5–5.0)
Alkaline Phosphatase: 175 U/L — ABNORMAL HIGH (ref 38–126)
Anion gap: 16 — ABNORMAL HIGH (ref 5–15)
BUN: 6 mg/dL (ref 6–20)
CO2: 23 mmol/L (ref 22–32)
Calcium: 9 mg/dL (ref 8.9–10.3)
Chloride: 104 mmol/L (ref 98–111)
Creatinine, Ser: 1.31 mg/dL — ABNORMAL HIGH (ref 0.61–1.24)
GFR calc Af Amer: >60 mL/min (ref >60)
GFR calc non Af Amer: >60 mL/min (ref >60)
Glucose, Bld: 56 mg/dL — ABNORMAL LOW (ref 70–99)
Potassium: 3.9 mmol/L (ref 3.5–5.1)
Sodium: 143 mmol/L (ref 135–145)
Total Bilirubin: 3.5 mg/dL — ABNORMAL HIGH (ref 0.3–1.2)
Total Protein: 6.4 g/dL — ABNORMAL LOW (ref 6.5–8.1)

## 2019-07-10 LAB — CBC
HCT: 39 % (ref 39.0–52.0)
Hemoglobin: 13.4 g/dL (ref 13.0–17.0)
MCH: 36.1 pg — ABNORMAL HIGH (ref 26.0–34.0)
MCHC: 34.4 g/dL (ref 30.0–36.0)
MCV: 105.1 fL — ABNORMAL HIGH (ref 80.0–100.0)
Platelets: 35 10*3/uL — ABNORMAL LOW (ref 150–400)
RBC: 3.71 MIL/uL — ABNORMAL LOW (ref 4.22–5.81)
RDW: 17.4 % — ABNORMAL HIGH (ref 11.5–15.5)
WBC: 6.8 10*3/uL (ref 4.0–10.5)
nRBC: 0 % (ref 0.0–0.2)

## 2019-07-10 LAB — PRIMIDONE, SERUM
Phenobarbital: NOT DETECTED ug/mL (ref 15–40)
Primidone, Serum: NOT DETECTED ug/mL (ref 5.0–12.0)

## 2019-07-10 MED ORDER — PANTOPRAZOLE SODIUM 40 MG IV SOLR
40.0000 mg | Freq: Two times a day (BID) | INTRAVENOUS | Status: DC
  Administered 2019-07-10 – 2019-07-11 (×4): 40 mg via INTRAVENOUS
  Filled 2019-07-10 (×4): qty 40

## 2019-07-10 MED ORDER — DEXTROSE-NACL 5-0.45 % IV SOLN: INTRAVENOUS | Status: DC

## 2019-07-10 MED ORDER — THIAMINE HCL 100 MG/ML IJ SOLN
100.0000 mg | Freq: Every day | INTRAMUSCULAR | Status: DC
  Administered 2019-07-10 – 2019-07-11 (×2): 100 mg via INTRAVENOUS
  Filled 2019-07-10 (×2): qty 2

## 2019-07-10 MED ORDER — SODIUM CHLORIDE 0.9 % IV SOLN
150.0000 mg | Freq: Two times a day (BID) | INTRAVENOUS | Status: DC
  Administered 2019-07-10 – 2019-07-12 (×5): 150 mg via INTRAVENOUS
  Filled 2019-07-10 (×6): qty 15

## 2019-07-10 NOTE — Evaluation (Signed)
Clinical/Bedside Swallow Evaluation Patient Details  Name: Hector [name] MRN: [id number] Date of Birth: May 28, 1970  Today's Date: 07/10/2019 Time: SLP Start Time (ACUTE ONLY): 1035 SLP Stop Time (ACUTE ONLY): 1054 SLP Time Calculation (min) (ACUTE ONLY): 19 min  Past Medical History:  Past Medical History:  Diagnosis Date  . Cirrhosis ([facility])   . Depression   . DVT (deep venous thrombosis) ([facility])    UE, LE DVT during hospitalization  . GERD (gastroesophageal reflux disease)   . H/O ETOH abuse   . H/O renal calculi   . Seizure ([facility])   . TBI (traumatic brain injury) ([facility])    07/2013, fall from standing post seizure  . Tremor    Past Surgical History:  Past Surgical History:  Procedure Laterality Date  . BRAIN SURGERY    . GASTROSTOMY TUBE PLACEMENT    . RADIOLOGY WITH ANESTHESIA N/A 04/20/2017   Procedure: MRI WITH ANESTHESIA OF THE BRAIN;  Surgeon: Radiologist, Medication, MD;  Location: [facility] OR;  Service: Radiology;  Laterality: N/A;  . REMOVAL OF GASTROSTOMY TUBE    . TRACHEOSTOMY     feinstein   HPI:  Hector [name] is a 49 y.o. male with history of tremor, TBI, seizures, alcohol abuse, DVT and cirrhosis.  Patient currently lives with his father. H/o dysphagia, last MBS (2019) regular/thin.    Assessment / Plan / Recommendation Clinical Impression   Pt upright in bed for session, observed with thin liquids, purees, and regular texture solids. Following all consistencies, pt was noted with suspected delayed swallow initiation and inconsistent throat clearing, increasing with progression. Pt's voice also became increasingly wet with trials. Pt's father arrived at the end of the session, reporting the pt has had more difficulties recently associated with swallowing (coughing), and is apparently planning to have his esophagus stretched in the near future at a separate hospital. Given pt's consistent s/sx of aspiration with all consistencies, further instrumental swallow study will  be needed to assess integrity of swallow function.    SLP Visit Diagnosis: Dysphagia, oropharyngeal phase (R13.12)    Aspiration Risk  Mild aspiration risk    Diet Recommendation NPO   Medication Administration: Crushed with puree    Other  Recommendations Oral Care Recommendations: Oral care BID   Follow up Recommendations Other (comment)(tba)      Frequency and Duration min 2x/week  2 weeks       Prognosis Prognosis for Safe Diet Advancement: Good      Swallow Study   General Date of Onset: 07/09/19 HPI: Hector [name] is a 49 y.o. male with history of tremor, TBI, seizures, alcohol abuse, DVT and cirrhosis.  Patient currently lives with his father. H/o dysphagia, last MBS (2019) regular/thin.  Type of Study: Bedside Swallow Evaluation Previous Swallow Assessment: see HPI Diet Prior to this Study: NPO Temperature Spikes Noted: No Respiratory Status: Room air History of Recent Intubation: No Behavior/Cognition: Alert;Cooperative Oral Cavity Assessment: Within Functional Limits Oral Care Completed by SLP: No Oral Cavity - Dentition: Adequate natural dentition Vision: Functional for self-feeding Self-Feeding Abilities: Able to feed self Patient Positioning: Upright in bed Baseline Vocal Quality: Normal Volitional Swallow: Able to elicit    Oral/Motor/Sensory Function Overall Oral Motor/Sensory Function: Within functional limits   Ice Chips Ice chips: Not tested   Thin Liquid Thin Liquid: Impaired Presentation: Cup;Self Fed Pharyngeal  Phase Impairments: Throat Clearing - Delayed;Throat Clearing - Immediate    Nectar Thick Nectar Thick Liquid: Not tested   Honey Thick Honey Thick  Liquid: Not tested   Puree Puree: Impaired Presentation: Self Fed Pharyngeal Phase Impairments: Suspected delayed Swallow;Throat Clearing - Immediate;Throat Clearing - Delayed   Solid     Solid: Impaired Presentation: Self Fed Pharyngeal Phase Impairments: Throat Clearing - Delayed      Aline August, Student SLP Office: [phone]  07/10/2019,11:15 AM

## 2019-07-10 NOTE — Progress Notes (Addendum)
PROGRESS NOTE    Hector [name]  KGM:[id number] DOB: Mar 06, 1971 DOA: 07/09/2019 PCP: Macy Mis, MD   Brief Narrative: HPI per Dr. Mikey College on 07/09/2019 Hector [name] is a 49 y.o. male with medical history significant of seizure, TBI, Cirrhosis from previous EtOH abuse, presented with tonic clonic seizure. Pt has been taken care of by his father who was with the patient at bedside. Pt post-ictal very sleepy and confused, so Hx was from father. Patient had a seizure with whole body shaking and eye rolling backwards on the floor. Woke up about few mins and then another one an hour and a half later. Father reported that patient has not had a seizure for at least 4-5 years. He had 2 of his seizure meds removed during the last visit a couple months ago at the neurologist office. Otherwise has been compliant with meds. Father also reported pt has been complains about epigastric pain after meal and diarrhea about one month. Pain last about 1 hour or 2, no night pain. Diarrhea was loose 2-3 episodes a day, no fever no chills, no weight loss. EMS reports a third seizure in route and they had to give 2.5 mg versed. ED Course: CT head showing chronic changes, lab work unremarkable.  **Interim History More awake and alert and SLP was done and they recommend strict n.p.o.  Ending continue seizure precautions and continue Keppra.  We are checking primidone, oxcarbazepine and Vimpat levels.  They canceled outpatient EEG.  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 Continue oxcarbazepine 600mg  twice daily, levetiracetam 500mg  twice daily and lacosamide for seizure prevention. 2. Continue primidone 100mg  at bedtime for tremor" -Initial discussion between Dr. and the patient's Father said there was no medication changes since, but  seems like pt has a new condition of diarrhea and abd pain.  -Somewhat concerning about malabsorption. Dr. discussed with neurology , who ordered blood level of antiseizure medications, including Keppra because it was bolused in the ED. -MRI showed "Motion degraded and prematurely terminated examination as described. There is apparent diffusion weighted hyperintensity without definite ADC correlate within portions of the medial left temporal lobe at the site of chronic left hippocampal volume loss. Findings may reflect artifact arising from the skull base, T2 shine through from gliosis or seizure related edema. Correlate clinically to exclude clinical features of HSV encephalitis, although this is considered less likely. Redemonstrated extensive encephalomalacia within additional portions of the left cerebral hemisphere as described. Associated ex vacuo dilatation of the left lateral ventricle and wallerian degeneration of the left cerebral peduncle. Persistent complete right maxillary sinus opacification."  -Further increased his Keppra dosage, and continue all other antiseizure medications and waiting for neurology updated recommendation. -SLP evaluated and recommended n.p.o. and then change the recommendations to dysphagia 3 diet with thin liquids after reevaluation -EEG was also ordered but canceled by neurology -Further care per neurology and appreciate their assistance judgment on this patient  Subacute Diarrhea -This past medical history indicated he has chronic gastritis, and postprandial epigastric clinic symptoms indicate recurrent gastritis/ulcer/started on PPI and change to IV -Increase his PPI and check stool antigen for H. Pylori -Another thought is pancreatitis, ordered a stool fat and elastase -Unable to order CT abdomen with contrast given his elevated creatinine level will need to continue monitor symptoms carefully -  Patient was started on IV fluids and is now  improving BUN/creatinine went from 9/1.58 is now 6/1.31 -Continue to monitor and if necessary will check stool studies  Hypoglycemia -Likely in the setting of poor p.o. intake and will change this to D5 half-normal saline as he was made n.p.o. by SLP but now a diet has been resumed -Continue monitor and trend  Transaminitis/Abnormal LFT's -Probably related to the seizure, however given his prolonged history of cirrhosis -Ordered a RUQ ultrasound,  -Synthetic hepatic function seems good as albumin level within normal limits, will also send INR. -Patient's AST went from 162 and is now 629 and ALT went from 180 is now 118  Tremors -On Bromocriptine  and able to resume now that he is no longer n.p.o.  AKI on CKD stage II -Patient was started on IV fluids and is now improving BUN/creatinine went from 9/1.58 is now 6/1.31 -Because he is n.p.o. he has not been eating slight change to fluids to D5 half-normal saline at a rate of 75 MLS per hour  -Avoid nephrotoxic medications, contrast dyes, hypotension and renally adjust medications -Repeat CMP in a.m.  Cirrhosis from prior alcohol abuse Hyperammonemia Hyperbilirubinemia  -RUQ U/S -Patient's ammonia level was 72 -T bili from 1.5 is now 3.5 -RUpper quadrant U/S showed "Prior cholecystectomy. The visualized common duct is normal in caliber. Heterogeneously increased hepatic parenchymal echogenicity. This is a nonspecific finding which may be seen in the setting of hepatic steatosis or other chronic hepatic parenchymal disease."  -Continue to monitor hepatic function panel closely and monitor for further signs of decompensation  Thrombocytopenia -Likely from Cirrhosis -Patient's platelet count went from 70,000 is now 35,000 he stopped his subcu Lovenox -Continue to monitor for signs and symptoms of bleeding -Repeat CBC in a.m.  Fever/SIRS -T-max of 100.9 yesterday and is tachycardic and tachypneic -This is improved -Continue to  monitor for signs and symptoms of infection and will likely panculture if he spikes another temperature  -Continue to monitor fever curve as well as WBC closely  DVT prophylaxis: Lovenox held due to his thrombocytopenia Code Status: FULL CODE  Family Communication: No family was present at bedside when I examined the patient and I attempted to call the patient's father at bedside but he had been going to the cafeteria and I called her cell phone and he did not pick up Disposition Plan: (specify when and where you expect patient to be discharged). Include barriers to DC in this tab.   Consultants:   Neurology   Procedures: None   Antimicrobials:  Anti-infectives (From admission, onward)   None     Subjective: Seen and examined at bedside and he was little bit more awake alert and I asked him how he did felt and he states "I do not know".  All questions I ask him are "I do not know.  "He was working with SLP when I walked in room.  No nausea or vomiting.  Is more awake and alert now than he was from what nursing stated earlier.  No family present at bedside and I was unable to speak with the patient's father as he did not pick up his telephone later on in the day..  Objective: Vitals:   07/09/19 1715 07/09/19 2031 07/09/19 2335 07/10/19 0338  BP: 130/74 129/73 119/70 111/68  Pulse: 90 91 94 94  Resp: (!) 26 18 17 18   Temp:  98.3 F (36.8 C) (!) 100.9 F (38.3 C) 98.4 F (36.9 C)  TempSrc:  Axillary Oral Oral  SpO2: 96% 100% 97% 97%  Weight:      Height:        Intake/Output Summary (Last 24 hours) at 07/10/2019 0754 Last data filed at 07/09/2019 1616 Gross per 24 hour  Intake 1100 ml  Output --  Net 1100 ml   Filed Weights   07/09/19 1357  Weight: 63.5 kg    Examination: Physical Exam:  Constitutional: Thin Caucasian male currently in, NAD and appears calm but does appear a little uncomfortable  Eyes: Lids and conjunctivae normal, sclerae anicteric  ENMT:  External Ears, Nose appear normal. Grossly normal hearing.  Neck: Appears normal, supple, no cervical masses, normal ROM, no appreciable thyromegaly neck: No JVD Respiratory: Diminished to auscultation bilaterally, no wheezing, rales, rhonchi or crackles. Normal respiratory effort and patient is not tachypenic. No accessory muscle use.  Unlabored breathing but he is trying to cough and sounds like he has a wet cough Cardiovascular: RRR, no murmurs / rubs / gallops. S1 and S2 auscultated. No extremity edema.  Abdomen: Soft, non-tender, non-distended.  Bowel sounds positive.  GU: Deferred. Musculoskeletal: No clubbing / cyanosis of digits/nails. No joint deformity upper and lower extremities.  Skin: No rashes, lesions, ulcers on limited skin evaluation. No induration; Warm and dry.  Neurologic: CN 2-12 grossly intact with no focal deficits and continues to have some mild trouble following verbal commands. Psychiatric: Impaired judgment and insight.  He is awake and alert but not oriented x 3.  Data Reviewed: I have personally reviewed following labs and imaging studies  CBC: Recent Labs  Lab 07/09/19 1400 07/09/19 1752 07/10/19 0341  WBC 7.4  --  6.8  NEUTROABS 5.3  --   --   HGB 15.2 13.3 13.4  HCT 47.8 39.0 39.0  MCV 115.2*  --  105.1*  PLT 70*  --  35*   Basic Metabolic Panel: Recent Labs  Lab 07/09/19 1400 07/09/19 1748 07/09/19 1752 07/10/19 0341  NA 141  --  139 143  K 4.1  --  4.0 3.9  CL 99  --   --  104  CO2 8*  --   --  23  GLUCOSE 164*  --   --  56*  BUN 9  --   --  6  CREATININE 1.58*  --   --  1.31*  CALCIUM 9.3  --   --  9.0  MG 2.4  --   --   --   PHOS  --  2.8  --   --    GFR: Estimated Creatinine Clearance: 60.6 mL/min (A) (by C-G formula based on SCr of 1.31 mg/dL (H)). Liver Function Tests: Recent Labs  Lab 07/09/19 1400 07/10/19 0341  AST 162* 629*  ALT 180* 118*  ALKPHOS 174* 175*  BILITOT 1.5* 3.5*  PROT 7.9 6.4*  ALBUMIN 4.2 3.3*   No  results for input(s): LIPASE, AMYLASE in the last 168 hours. Recent Labs  Lab 07/09/19 1748  AMMONIA 72*   Coagulation Profile: Recent Labs  Lab 07/09/19 1746  INR 1.0   Cardiac Enzymes: No results for input(s): CKTOTAL, CKMB, CKMBINDEX, TROPONINI in the last 168 hours. BNP (last 3 results) No results for input(s): PROBNP in the last 8760 hours. HbA1C: No results for input(s): HGBA1C in the last 72 hours. CBG: Recent Labs  Lab 07/09/19 1409  GLUCAP 151*   Lipid Profile: No results for input(s): CHOL, HDL, LDLCALC, TRIG, CHOLHDL, LDLDIRECT in the last 72 hours. Thyroid Function Tests:  No results for input(s): TSH, T4TOTAL, FREET4, T3FREE, THYROIDAB in the last 72 hours. Anemia Panel: Recent Labs    07/09/19 1748  VITAMINB12 347  FOLATE 1.9*   Sepsis Labs: Recent Labs  Lab 07/09/19 1748  LATICACIDVEN 3.9*    Recent Results (from the past 240 hour(s))  SARS CORONAVIRUS 2 (TAT 6-24 HRS) Nasopharyngeal Nasopharyngeal Swab     Status: None   Collection Time: 07/09/19  4:18 PM   Specimen: Nasopharyngeal Swab  Result Value Ref Range Status   SARS Coronavirus 2 NEGATIVE NEGATIVE Final    Comment: (NOTE) SARS-CoV-2 target nucleic acids are NOT DETECTED. The SARS-CoV-2 RNA is generally detectable in upper and lower respiratory specimens during the acute phase of infection. Negative results do not preclude SARS-CoV-2 infection, do not rule out co-infections with other pathogens, and should not be used as the sole basis for treatment or other patient management decisions. Negative results must be combined with clinical observations, patient history, and epidemiological information. The expected result is Negative. Fact Sheet for Patients: HairSlick.no Fact Sheet for Healthcare Providers: quierodirigir.com This test is not yet approved or cleared by the [place] FDA and  has been authorized for detection and/or  diagnosis of SARS-CoV-2 by FDA under an Emergency Use Authorization (EUA). This EUA will remain  in effect (meaning this test can be used) for the duration of the COVID-19 declaration under Section 56 4(b)(1) of the Act, 21 U.S.C. section 360bbb-3(b)(1), unless the authorization is terminated or revoked sooner. Performed at [facility], [address] [place], [place] [address]      RN Pressure Injury Documentation:     Estimated body mass index is 22.6 kg/m as calculated from the following:   Height as of this encounter:  (1.676 m).   Weight as of this encounter: 63.5 kg.  Malnutrition Type:      Malnutrition Characteristics:      Nutrition Interventions:    Radiology Studies: CT HEAD WO CONTRAST  Result Date: 07/09/2019 CLINICAL DATA:  Seizure EXAM: CT HEAD WITHOUT CONTRAST TECHNIQUE: Contiguous axial images were obtained from the base of the skull through the vertex without intravenous contrast. COMPARISON:  July 13, 2017 FINDINGS: Brain: Postoperative changes noted on the left. There is extensive encephalomalacia throughout much of the left temporal lobe as well as involving portions of the left posterior frontal and left parietal lobes, stable. There is enlargement of the left lateral ventricle, particularly involving the atrium of the left lateral ventricle, an ex vacuo basis, stable. There is no demonstrable mass, hemorrhage, extra-axial fluid collection, or midline shift. Mild underlying atrophy is stable. No acute infarct is demonstrable. Vascular: No hyperdense vessel. There is slight vascular calcification in the carotid siphon regions. Skull: Postoperative changes are noted involving the left frontal and temporal bones, stable. No new bone lesions. Sinuses/Orbits: There is mucosal thickening in several ethmoid air cells. There is opacification of the visualized right maxillary antrum. There is rightward deviation of the nasal septum. Orbits appear  symmetric bilaterally. Other: Mastoid air cells are clear. IMPRESSION: Extensive encephalomalacia on the left with asymmetric enlargement of the left lateral ventricle on an ex vacuo basis, stable. Mild underlying generalized atrophy. No acute infarct. No mass or hemorrhage. Postoperative bony changes involving portions of the left temporal and frontal bones Areas of paranasal sinus disease, primarily involving the right maxillary antrum. Deviated nasal septum. Electronically Signed   By: Bretta Bang III M.D.   On: 07/09/2019 15:41   MR BRAIN  WO CONTRAST  Result Date: 07/09/2019 CLINICAL DATA:  Seizure, abnormal neuro exam. EXAM: MRI HEAD WITHOUT CONTRAST TECHNIQUE: Multiplanar, multiecho pulse sequences of the brain and surrounding structures were obtained without intravenous contrast. COMPARISON:  Noncontrast head CT performed earlier the same day 07/09/2019, head CT 07/14/2017, brain MRI 04/20/2017 FINDINGS: Brain: The patient was unable to tolerate the full examination. As a result, only axial and coronal diffusion-weighted imaging, a sagittal T1 FLAIR sequence and and an axial T2 weighted sequence could be obtained. The sagittal T1 weighted sequence is mildly motion degraded. There is moderate to severe motion degradation of the axial T2 weighted sequence. There is apparent diffusion weighted hyperintensity within portions of the medial left temporal lobe without definite ADC correlate (series 2, image 18) (series 3, image 21). No diffusion-weighted signal abnormality is identified elsewhere within the brain. Redemonstrated extensive encephalomalacia within the left cerebral hemisphere involving the left frontal, temporal and parietal lobes. Associated chronic left hippocampal volume loss. Redemonstrated ex vacuo dilatation of the left lateral ventricle and atrophy of the left cerebral peduncle consistent with wallerian degeneration. Unchanged generalized cerebral atrophy Vascular: Flow voids  maintained within the proximal large arterial vessels. Non dominant intracranial right vertebral artery. Skull and upper cervical spine: No focal marrow lesion is identified. Left temporoparietal cranioplasty. Sinuses/Orbits: Visualized orbits demonstrate no acute abnormality. Mild ethmoid sinus mucosal thickening. Complete opacification of the right maxillary sinus. No significant mastoid effusion IMPRESSION: Motion degraded and prematurely terminated examination as described. There is apparent diffusion weighted hyperintensity without definite ADC correlate within portions of the medial left temporal lobe at the site of chronic left hippocampal volume loss. Findings may reflect artifact arising from the skull base, T2 shine through from gliosis or seizure related edema. Correlate clinically to exclude clinical features of HSV encephalitis, although this is considered less likely. Redemonstrated extensive encephalomalacia within additional portions of the left cerebral hemisphere as described. Associated ex vacuo dilatation of the left lateral ventricle and wallerian degeneration of the left cerebral peduncle. Persistent complete right maxillary sinus opacification. Electronically Signed   By: Jackey Loge DO   On: 07/09/2019 19:58   DG Chest Portable 1 View  Result Date: 07/09/2019 CLINICAL DATA:  Seizure. EXAM: PORTABLE CHEST 1 VIEW COMPARISON:  Chest radiograph 07/11/2017 FINDINGS: Heart size within normal limits. No evidence of airspace consolidation within the lungs. No evidence of pleural effusion or pneumothorax. Chronic elevation of the left hemidiaphragm. No acute bony abnormality. Overlying cardiac monitoring leads. IMPRESSION: No evidence of acute cardiopulmonary abnormality. Electronically Signed   By: Jackey Loge DO   On: 07/09/2019 14:25   US Abdomen Limited RUQ  Result Date: 07/09/2019 CLINICAL DATA:  LFT elevation. Additional history provided by technologist: Prior cholecystectomy EXAM:  ULTRASOUND ABDOMEN LIMITED RIGHT UPPER QUADRANT COMPARISON:  CT abdomen/pelvis 07/01/2017 FINDINGS: Gallbladder: Prior cholecystectomy. Common bile duct: Diameter: 4-5 mm, within normal limits. Liver: No focal lesion identified. Heterogeneously increased hepatic parenchymal echogenicity. The interrogated portal vein is patent on color Doppler imaging with normal direction of blood flow towards the liver. IMPRESSION: Prior cholecystectomy. The visualized common duct is normal in caliber. Heterogeneously increased hepatic parenchymal echogenicity. This is a nonspecific finding which may be seen in the setting of hepatic steatosis or other chronic hepatic parenchymal disease. Electronically Signed   By: Jackey Loge DO   On: 07/09/2019 18:00   Scheduled Meds: . bromocriptine  2.5 mg Oral BID  . enoxaparin (LOVENOX) injection  40 mg Subcutaneous Q24H  . lacosamide  150 mg Oral  BID  . oxcarbazepine  600 mg Oral BID  . pantoprazole  40 mg Oral BID AC  . primidone  100 mg Oral QHS  . QUEtiapine  100 mg Oral 2 times per day  . QUEtiapine  200 mg Oral QHS  . thiamine  100 mg Oral Daily   Continuous Infusions: . sodium chloride    . levETIRAcetam 1,000 mg (07/10/19 0527)    LOS: 1 day   Kerney Elbe, DO [facility] PAGER is on [place]  If 7PM-7AM, please contact night-coverage [email or web address]

## 2019-07-10 NOTE — Progress Notes (Addendum)
NEUROLOGY PROGRESS NOTE  Subjective: Patient has no complaints.  Answers "yeah" to all questions.    Exam: Vitals:   07/09/19 2335 07/10/19 0338  BP: 119/70 111/68  Pulse: 94 94  Resp: 17 18  Temp: (!) 100.9 F (38.3 C) 98.4 F (36.9 C)  SpO2: 97% 97%        Physical Exam  Constitutional: Thin and somewhat malnourished Eyes: No scleral injection HENT: No OP obstrucion Head: Normocephalic.  Cardiovascular: Normal rate and regular rhythm.  Respiratory: Effort normal, non-labored breathing GI: Soft.  No distension. There is no tenderness.  Skin: WDI Neuro:  Mental Status: Patient wakes easily to voice.  With visual coaxing he is able to follow some simple commands. Cranial Nerves: II: Blinks to threat on the left III,IV, VI: ptosis not present, extra-ocular motions intact bilaterally pupils equal, round, reactive to light and accommodation V,VII: Right facial droop VIII: hearing normal bilaterally Motor: Right : Upper extremity   5/5    Left:     Upper extremity   5/5  Lower extremity   3/5     Lower extremity   3/5 Sensory: She stimuli throughout Deep Tendon Reflexes: No ankle jerk, no knee jerk, 2+ at brachioradialis and bicep bilaterally Plantars: Mute bilaterally Cerebellar: No dysmetria noted with attempted finger-to-nose     Medications:  Scheduled: . bromocriptine  2.5 mg Oral BID  . oxcarbazepine  600 mg Oral BID  . pantoprazole (PROTONIX) IV  40 mg Intravenous Q12H  . primidone  100 mg Oral QHS  . QUEtiapine  100 mg Oral 2 times per day  . QUEtiapine  200 mg Oral QHS  . thiamine injection  100 mg Intravenous Daily    Pertinent Labs/Diagnostics: Calcium 9.3 Potassium 4.1 Sodium 141 Urinalysis pending Urine culture pending Phosphorus 2.8 Folate 1.9 Ammonia 72 Primidone level pending Oxcarbazepine level pending Vimpat level pending  CT HEAD WO CONTRAST  Result Date: 07/09/2019 CLINICAL DATA:  Seizure EXAM: CT HEAD WITHOUT CONTRAST  TECHNIQUE: Contiguous axial images  IMPRESSION: Extensive encephalomalacia on the left with asymmetric enlargement of the left lateral ventricle on an ex vacuo basis, stable. Mild underlying generalized atrophy. No acute infarct. No mass or hemorrhage. Postoperative bony changes involving portions of the left temporal and frontal bones Areas of paranasal sinus disease, primarily involving the right maxillary antrum. Deviated nasal septum. Electronically Signed   By: Bretta Bang III M.D.   On: 07/09/2019 15:41   MR BRAIN WO CONTRAST  Result Date: 07/09/2019 CLINICAL DATA:  Seizure, abnormal neuro exam. EXAM: MRI HEAD WITHOUT CONTRAST TECHNIQUE:  IMPRESSION: Motion degraded and prematurely terminated examination as described. There is apparent diffusion weighted hyperintensity without definite ADC correlate within portions of the medial left temporal lobe at the site of chronic left hippocampal volume loss. Findings may reflect artifact arising from the skull base, T2 shine through from gliosis or seizure related edema. Correlate clinically to exclude clinical features of HSV encephalitis, although this is considered less likely. Redemonstrated extensive encephalomalacia within additional portions of the left cerebral hemisphere as described. Associated ex vacuo dilatation of the left lateral ventricle and wallerian degeneration of the left cerebral peduncle. Persistent complete right maxillary sinus opacification. Electronically Signed   By: Jackey Loge DO   On: 07/09/2019 19:58      Assessment:  This is a 49 year old male presenting to the hospital with 3 breakthrough seizures.  As noted previously, he does live with his father who stated that he has been getting his medications  on time and has not missed any doses.  There is question if his diarrhea may have caused some malabsorption given how well he was controlled in the past.  Today's exam he is much more awake and following  commands.   Impression: Breakthrough seizure in the setting of diarrhea Given MRI was very motion degraded and there is no ADC correlation do believe that the findings at the skull base is most likely artifact.  Recommendations: -Seeing that patient's exam has improved significantly will cancel EEG -Seizure precautions -We will continue Keppra 1000 mg twice daily -Primidone, oxcarbazepine and Vimpat level still pending  --at this time will S/O call with any questions  Etta Quill PA-C [facility] [phone]  07/10/2019, 9:02 AM

## 2019-07-10 NOTE — Progress Notes (Signed)
Modified Barium Swallow Progress Note  Patient Details  Name: Hector [name] MRN: [id number] Date of Birth: 11-Dec-1970  Today's Date: 07/10/2019  Modified Barium Swallow completed.  Full report located under Chart Review in the Imaging Section.  Brief recommendations include the following:  Clinical Impression  Patient presents with oropharyngeal dysphagia across all consistencies, grossly similar to most recent MBS (2019). Oral phase is remarkable for decreased bolus cohesion resulting in premature spillage to the vallecula and pyriform sinuses. Pharyngeal phase is remarkable for mildly reduced laryngeal closure and vallecular and pyriform sinus residue resulting in penetration/aspiration. Trace aspiration (PAS 8) occured with thin and nectar thick liquids during and after the swallow. As the pt took multiple swallows and cleared his throat in between bites/sips, pt was able to reduce the amount of residue and frequency of penetration/aspiration. A chin tuck was also utilized, helping to prevent aspiration, but pt was unable to maintain position throughout the study. Given the amount of residue, recommend Dys 3 diet and thin liquids. Ensure the pt takes multiple swallows and clears his throat in between bites/sips. Will f/u for potential training for use of chin tuck.   Swallow Evaluation Recommendations       SLP Diet Recommendations: Thin liquid;Dysphagia 3 (Mech soft) solids   Liquid Administration via: Cup;Straw   Medication Administration: Whole meds with puree   Supervision: Full supervision/cueing for compensatory strategies;Staff to assist with self feeding   Compensations: Minimize environmental distractions;Slow rate;Small sips/bites;Multiple dry swallows after each bite/sip;Clear throat after each swallow   Postural Changes: Remain semi-upright after after feeds/meals (Comment);Seated upright at 90 degrees   Oral Care Recommendations: Oral care BID       Maudry Mayhew,  Student SLP Office: [phone]  07/10/2019,4:54 PM

## 2019-07-10 NOTE — Progress Notes (Signed)
Initial Nutrition Assessment  DOCUMENTATION CODES:   Severe malnutrition in context of chronic illness  INTERVENTION:  Monitor for diet advancement and order supplements as warranted.   If pt is to remain NPO, recommend Cortrak. Consider: Osmolite 1.5 @ 87ml/hour, increase 88ml/hr Q4 until goal rate of 10ml/hr ( ) is reached  61ml Pro-stat BID  Free water flushes per MD  Recommended tube feeding regimen will provide 2000 kcals, 105 grams of protein, free water   NUTRITION DIAGNOSIS:   Severe Malnutrition related to chronic illness(history of TBI, cirrhosis) as evidenced by severe muscle depletion, severe fat depletion.    GOAL:   Patient will meet greater than or equal to 90% of their needs    MONITOR:   Diet advancement, Labs, Weight trends  REASON FOR ASSESSMENT:   Consult Assessment of nutrition requirement/status  ASSESSMENT:   Pt with a PMH significant for seizure, TBI, Cirrhosis from previous EtOH abuse, presented with tonic clonic seizure  Pt sleeping at time of RD visit and would not wake to RD voice. Unable to obtain detailed diet and weight hx at this time. Per H&P, pt's father reports pt has been C/O post prandial epigastric pain and diarrhea for 1 month PTA.   No diet order at this time. Discussed pt with SLP. Pt to have MBS.   Medications reviewed and include: Thiamine, D5-1/2 NS @ 100ml/hr Labs reviewed.   NUTRITION - FOCUSED PHYSICAL EXAM:    Most Recent Value  Orbital Region  No depletion  Upper Arm Region  Severe depletion  Thoracic and Lumbar Region  Severe depletion  Buccal Region  No depletion  Temple Region  Moderate depletion  Clavicle Bone Region  Severe depletion  Clavicle and Acromion Bone Region  Severe depletion  Scapular Bone Region  Severe depletion  Dorsal Hand  No depletion  Patellar Region  Moderate depletion  Anterior Thigh Region  Severe depletion  Posterior Calf Region  Severe depletion  Edema (RD  Assessment)  None  Hair  Reviewed  Eyes  Reviewed  Mouth  Unable to assess [pt sleeping]  Skin  Reviewed  Nails  Reviewed       Diet Order:   Diet Order    None      EDUCATION NEEDS:   Not appropriate for education at this time  Skin:  Skin Assessment: Reviewed RN Assessment  Last BM:  unknown  Height:   Ht Readings from Last 1 Encounters:  07/09/19 5\' 6"  (1.676 m)    Weight:   Wt Readings from Last 3 Encounters:  07/09/19 63.5 kg  04/11/19 61.2 kg  11/24/17 56.7 kg     BMI:  Body mass index is 22.6 kg/m.  Estimated Nutritional Needs:   Kcal:  1900-2100  Protein:  95-110 grams  Fluid:  >/= 1.9L    01/24/18, MS, RD, LDN RD pager number and weekend/on-call pager number located in Amion.

## 2019-07-11 DIAGNOSIS — S069X9S Unspecified intracranial injury with loss of consciousness of unspecified duration, sequela: Secondary | ICD-10-CM

## 2019-07-11 LAB — HEPATITIS PANEL, ACUTE
HCV Ab: NONREACTIVE
HCV Ab: NONREACTIVE
Hep A IgM: NONREACTIVE
Hep A IgM: NONREACTIVE
Hep B C IgM: NONREACTIVE
Hep B C IgM: NONREACTIVE
Hepatitis B Surface Ag: NONREACTIVE
Hepatitis B Surface Ag: NONREACTIVE

## 2019-07-11 LAB — MISC LABCORP TEST (SEND OUT): Labcorp test code: 7012

## 2019-07-11 NOTE — Evaluation (Addendum)
Speech Language Pathology Evaluation Patient Details Name: Hector [name] MRN: [id number] DOB: 1970-08-07 Today's Date: 07/11/2019 Time: 1000-1029 SLP Time Calculation (min) (ACUTE ONLY): 29 min  Problem List:  Patient Active Problem List   Diagnosis Date Noted  . Seizure (HCC) 07/09/2019  . Status post seizure 07/14/2017  . Agitation   . Aspiration pneumonia due to gastric secretions (HCC)   . Hypoxia   . Coarse tremors 04/18/2017  . ARF (acute renal failure) (HCC) 04/18/2017  . Hypokalemia   . Hypomagnesemia   . Physical deconditioning   . FTT (failure to thrive) in adult 02/25/2017  . Oropharyngeal dysphagia 01/01/2017  . Orthostatic hypotension 01/01/2017  . Failure to thrive in adult 12/31/2016  . Vomiting   . Seizures (HCC) 06/01/2015  . Alcoholic cirrhosis of liver without ascites (HCC)   . Dehydration   . Encephalopathy acute 02/13/2015  . Altered mental status   . Ataxia 02/12/2015  . Localization-related symptomatic epilepsy and epileptic syndromes with complex partial seizures, not intractable, without status epilepticus (HCC) 08/16/2014  . Tobacco abuse 08/16/2014  . Acute kidney injury superimposed on CKD (HCC) 06/25/2014  . Dysphagia 06/25/2014  . Centrilobular emphysema (HCC) 01/27/2014  . Tracheostomy in place [facility]) 01/27/2014  . Encounter for family conference with patient present 01/23/2014  . Dvt femoral (deep venous thrombosis) (HCC) 01/05/2014  . Protein-calorie malnutrition, severe (HCC) 01/05/2014  . Alcohol abuse 01/05/2014  . Acute respiratory failure with hypoxia (HCC) 12/20/2013  . Delirium due to general medical condition 12/13/2013  . HCAP (healthcare-associated pneumonia) 12/13/2013  . SIRS (systemic inflammatory response syndrome) (HCC) 12/13/2013  . TBI (traumatic brain injury) (HCC) 12/12/2013   Past Medical History:  Past Medical History:  Diagnosis Date  . Cirrhosis (HCC)   . Depression   . DVT (deep venous thrombosis) (HCC)     UE, LE DVT during hospitalization  . GERD (gastroesophageal reflux disease)   . H/O ETOH abuse   . H/O renal calculi   . Seizure (HCC)   . TBI (traumatic brain injury) (HCC)    07/2013, fall from standing post seizure  . Tremor    Past Surgical History:  Past Surgical History:  Procedure Laterality Date  . BRAIN SURGERY    . GASTROSTOMY TUBE PLACEMENT    . RADIOLOGY WITH ANESTHESIA N/A 04/20/2017   Procedure: MRI WITH ANESTHESIA OF THE BRAIN;  Surgeon: Radiologist, Medication, MD;  Location: [facility] OR;  Service: Radiology;  Laterality: N/A;  . REMOVAL OF GASTROSTOMY TUBE    . TRACHEOSTOMY     feinstein   HPI:  Hector [name] is a 49 y.o. male with history of tremor, TBI, seizures, alcohol abuse, DVT and cirrhosis.  Patient currently lives with his father. H/o dysphagia, last MBS (2019) regular/thin.  Pt with aphasia, MRI shows There is apparent diffusion weighted hyperintensity without definite ADC correlate within portions of the medial left temporal lobe at the site of chronic left hippocampal volume loss. Findings may reflect artifact arising from the skull base, T2 shine through from gliosis or seizure related edema. Pt apparently has a history of TBI and aphasia.   Assessment / Plan / Recommendation Clinical Impression  Pt demonstrates a severe aphasia with no verbal comprehension appreciated of single words or commands, pt follows some gestures and contextual cues. He was unable to identify objects in a field to two with max teaching, did not follow commands or recognize written words. Most verbalizations consisted of a response of "i dont know" "no/yes" (without accuracy)  and a /dada/ stereotypy with accurate prosody for questions, requests and statements. Efforts to cue pt in repetition of his name and coutning were unsuccessful. Pt will benefit from acute SLP f/u to address functional communication though this may be consistent with pts baseline function without expected benefit from  further interventions    SLP Assessment  SLP Recommendation/Assessment: Patient needs continued Speech Lanaguage Pathology Services SLP Visit Diagnosis: Aphasia (R47.01)    Follow Up Recommendations  Other (comment)    Frequency and Duration min 2x/week  2 weeks      SLP Evaluation Cognition  Overall Cognitive Status: Impaired/Different from baseline Arousal/Alertness: Awake/alert Orientation Level: Oriented to person Attention: Focused;Sustained Focused Attention: Appears intact Sustained Attention: Appears intact Awareness: Impaired Awareness Impairment: Intellectual impairment;Emergent impairment Problem Solving: Impaired Problem Solving Impairment: Verbal basic       Comprehension  Auditory Comprehension Overall Auditory Comprehension: Impaired Yes/No Questions: Impaired Basic Biographical Questions: 26-50% accurate Commands: Impaired One Step Basic Commands: 0-24% accurate Conversation: Simple EffectiveTechniques: Visual/Gestural cues Reading Comprehension Reading Status: Impaired Word level: Impaired Sentence Level: Impaired    Expression Verbal Expression Overall Verbal Expression: Impaired Initiation: No impairment Automatic Speech: (unable to count or state name) Level of Generative/Spontaneous Verbalization: Word;Phrase Repetition: Impaired Level of Impairment: Word level Naming: Impairment Responsive: Not tested Confrontation: Impaired Convergent: 0-24% accurate Verbal Errors: Not aware of errors;Perseveration(staereotypu) Pragmatics: Impairment Impairments: Other (comment)(initiation and awareness)   Oral / Motor  Oral Motor/Sensory Function Overall Oral Motor/Sensory Function: Within functional limits Motor Speech Overall Motor Speech: Appears within functional limits for tasks assessed   GO                   Herbie Baltimore, MA CCC-SLP  Acute Rehabilitation Services Pager [phone] Office [phone]  Lynann Beaver 07/11/2019, 2:18 PM

## 2019-07-11 NOTE — Progress Notes (Addendum)
PROGRESS NOTE  MENDY LAPINSKY ZOX:[id number] DOB: [birth year] DOA: 07/09/2019 PCP: Katherina Mires, MD  Brief History   Hector [name] is a 49 y.o. male with medical history significant of seizure, TBI, Cirrhosis from previous EtOH abuse, presented with tonic clonic seizure. Pt has been taken care of by his father who was with the patient at bedside. Pt post-ictal very sleepy and confused, so Hx was from father.  Patient had a seizure with whole body shaking and eye rolling backwards on the floor. Woke up about few mins and then another one an hour and a half later.  Father reported that patient has not had a seizure for at least 4-5 years.  He had 2 of his seizure meds removed during the last visit a couple months ago at the neurologist office.  Otherwise has been compliant with meds.  Father also reported pt has been complains about epigastric pain after meal and diarrhea about one month. Pain last about 1 hour or 2, no night pain. Diarrhea was loose 2-3 episodes a day, no fever no chills, no weight loss.  EMS reports a third seizure in route and they had to give 2.5 mg versed.  CT head showing chronic changes, lab work unremarkable. SLP has evaluated the patient and recommended DYS 3 with thin consistency liquids. The patient will be continued on IV  keppra and Vimpat. Levels of Oxcarzepime, vimpat, and primidone are being checked. Outpatient EEG has been cancelled.   Consultants  Neurology  Procedures  EEG  Antibiotics   Anti-infectives (From admission, onward)    None      Interval Subjective  The patient is resting comfortably. No new complaints.  Objective   Vitals:  Vitals:   07/11/19 1231 07/11/19 1605  BP: 111/73 111/78  Pulse: 65 65  Resp: 18 (!) 24  Temp: 98.2 F (36.8 C) 97.6 F (36.4 C)  SpO2: 100% 100%   Exam:  Constitutional:  The patient is awake, alert, and oriented x 3. No acute distress. Respiratory:  No increased work of breathing. No wheezes,  rales, or rhonchi No tactile fremitus Cardiovascular:  Regular rate and rhythm No murmurs, ectopy, or gallups. No lateral PMI. No thrills. Abdomen:  Abdomen is soft, non-tender, non-distended No hernias, masses, or organomegaly Normoactive bowel sounds.  Musculoskeletal:  No cyanosis, clubbing, or edema Skin:  No rashes, lesions, ulcers palpation of skin: no induration or nodules Neurologic:  CN 2-12 intact Sensation all 4 extremities intact Psychiatric:  Mental status Mood, affect appropriate Orientation to person, place, time  judgment and insight appear intact   I have personally reviewed the following:   Today's Data  Vitals  Scheduled Meds:  bromocriptine  2.5 mg Oral BID   oxcarbazepine  600 mg Oral BID   pantoprazole (PROTONIX) IV  40 mg Intravenous Q12H   primidone  100 mg Oral QHS   QUEtiapine  100 mg Oral 2 times per day   QUEtiapine  200 mg Oral QHS   thiamine injection  100 mg Intravenous Daily   Continuous Infusions:  dextrose 5 % and 0.45% NaCl 75 mL/hr at 07/11/19 0239   lacosamide (VIMPAT) IV 150 mg (07/11/19 1049)   levETIRAcetam 1,000 mg (07/11/19 1611)    Active Problems:   Seizure ([place])   LOS: 2 days   A & P   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  Continue oxcarbazepine  600mg  twice daily, levetiracetam 500mg  twice daily and lacosamide for seizure prevention. 2.  Continue primidone 100mg  at bedtime for tremor". However, it is possible that the patient's stated issues with diarhea and abdominal pain may signal an issue with absorprion. Blood levels of keppra, olanzapine, vimpat have been ordered.   MRI showed "Motion degraded and prematurely terminated examination as described. There is apparent diffusion weighted hyperintensity without definite ADC correlate within portions of the medial left temporal lobe at the site of chronic left  hippocampal volume loss. Findings may reflect artifact arising from the skull base, T2 shine through from gliosis or seizure related edema. Correlate clinically to exclude clinical features of HSV encephalitis, although this is considered less likely. Redemonstrated extensive encephalomalacia within additional portions of the left cerebral hemisphere as described. Associated ex vacuo dilatation of the left lateral ventricle and wallerian degeneration of the left cerebral peduncle. Persistent complete right maxillary sinus opacification."   The patient's Keppra dose have been increased and all other anti-seizure medications have been continued. There are nor further recommendations from neurology. EEG was also ordered but canceled by neurology. Further care per neurology and appreciate their assistance judgment on this patient.  Dysphagia: SLP evaluated and recommended n.p.o. and then change the recommendations to dysphagia 3 diet with thin liquids after reevaluation.   Subacute Diarrhea: This past medical history indicated he has chronic gastritis, and postprandial epigastric clinic symptoms indicate recurrent gastritis/ulcer/started on PPI and changed to IV. No stools recorded. Monitor.   AKI on CKD III: Secondary to diarrhea and poor PO intake. Improved. Creatinine is now 1.31. Avoid nephrotoxic substances and hypotension. Monitor creatinine, electrolytes, and volume status.    Hypoglycemia: Likely in the setting of poor p.o. intake and will change this to D5 half-normal saline as he was made n.p.o. by SLP but now a diet has been resumed. Blood glucoses within normal range today. Will monitor overnight and consider discontinuation of IV D5 1/2 normal in the am depending of blood sugars. Will also order calorie count for oral intake.   Transaminitis/Abnormal LFT's: Probably related to the seizure, however given his prolonged history of cirrhosis. RUQ ultrasound has ruled out bile duct dilatation, but  demonstrates cirrhosis. Synthetic hepatic function seems good as albumin level within normal limits, will also send INR. Order hepatitis panel.   Tremors: On Bromocriptine  and able to resume now that he is no longer n.p.o.   Thrombocytopenia: Likely from Cirrhosis. Patient's platelet count went from 70,000 is now 35,000. Subcutaneous lovenox has been stopped. Continue to monitor for signs and symptoms of bleeding. Monitor.   Fever/SIRS: T-max of 100.9 on 07/09/2019 with tachycardia and tachypnia. Resolved. Continue to monitor for signs and symptoms of infection and will likely panculture if he spikes another temperature.  Severe protein calorie malnutrition: Nutrition consulted. Supplements given.  I have seen and examined this patient myself. I have spent 34 minutes in his evaluation and care.   DVT prophylaxis: Lovenox held due to his thrombocytopenia Code Status: FULL CODE  Family Communication: No family was present at bedside when I examined the patient and I attempted to call the patient's father at bedside but he had been going to the cafeteria and I called her cell phone and he did not pick up Disposition Plan: Patient is from home. Anticipate discharge to home pending PT/OT eval.  Chayil Gantt, DO [facility] Direct contact: see [email or web address]  7PM-7AM contact night coverage as above 07/11/2019, 5:33 PM  LOS: 2 days

## 2019-07-11 NOTE — Progress Notes (Signed)
  Speech Language Pathology Treatment: Dysphagia  Patient Details Name: Hector [name] MRN: [id number] DOB: 08-03-1970 Today's Date: 07/11/2019 Time: 1000-1020 SLP Time Calculation (min) (ACUTE ONLY): 20 min  Assessment / Plan / Recommendation Clinical Impression  Pt found in bed, reportedly had breakfast, but pt found to e completely incapable of reporting any recent events or following verbal commands given aphasia. Attempted to train pt in compensatory strategies for swallowing but pt did not follow commands and only repeated "I dont know" with multimodal cueing. Despite this, pt did demonstrates spontaneous throat clearing and multiple swallows which appeared to allow tolerance of thin liquids given MBS findings. Will proceed with cognitive linguistic eval given pts behavior and needs.   HPI HPI: Hector [name] is a 49 y.o. male with history of tremor, TBI, seizures, alcohol abuse, DVT and cirrhosis.  Patient currently lives with his father. H/o dysphagia, last MBS (2019) regular/thin.       SLP Plan  Continue with current plan of care       Recommendations  Diet recommendations: Dysphagia 3 (mechanical soft);Thin liquid Liquids provided via: Cup;Straw Medication Administration: Crushed with puree Supervision: Patient able to self feed Compensations: Minimize environmental distractions;Slow rate;Small sips/bites;Multiple dry swallows after each bite/sip;Clear throat after each swallow                Follow up Recommendations: Other (comment) SLP Visit Diagnosis: Dysphagia, oropharyngeal phase (R13.12) Plan: Continue with current plan of care       GO               Harlon Ditty, MA CCC-SLP  Acute Rehabilitation Services Pager [phone] Office [phone]  Claudine Mouton 07/11/2019, 2:05 PM

## 2019-07-11 NOTE — Plan of Care (Signed)

## 2019-07-12 DIAGNOSIS — N179 Acute kidney failure, unspecified: Secondary | ICD-10-CM

## 2019-07-12 DIAGNOSIS — N182 Chronic kidney disease, stage 2 (mild): Secondary | ICD-10-CM

## 2019-07-12 DIAGNOSIS — R7401 Elevation of levels of liver transaminase levels: Secondary | ICD-10-CM

## 2019-07-12 DIAGNOSIS — D696 Thrombocytopenia, unspecified: Secondary | ICD-10-CM

## 2019-07-12 LAB — COMPREHENSIVE METABOLIC PANEL
ALT: 67 U/L — ABNORMAL HIGH (ref 0–44)
AST: 159 U/L — ABNORMAL HIGH (ref 15–41)
Albumin: 2.4 g/dL — ABNORMAL LOW (ref 3.5–5.0)
Alkaline Phosphatase: 125 U/L (ref 38–126)
Anion gap: 7 (ref 5–15)
BUN: 12 mg/dL (ref 6–20)
CO2: 29 mmol/L (ref 22–32)
Calcium: 8.2 mg/dL — ABNORMAL LOW (ref 8.9–10.3)
Chloride: 102 mmol/L (ref 98–111)
Creatinine, Ser: 0.98 mg/dL (ref 0.61–1.24)
GFR calc Af Amer: >60 mL/min (ref >60)
GFR calc non Af Amer: >60 mL/min (ref >60)
Glucose, Bld: 93 mg/dL (ref 70–99)
Potassium: 3.6 mmol/L (ref 3.5–5.1)
Sodium: 138 mmol/L (ref 135–145)
Total Bilirubin: 0.9 mg/dL (ref 0.3–1.2)
Total Protein: 5 g/dL — ABNORMAL LOW (ref 6.5–8.1)

## 2019-07-12 LAB — GLUCOSE, CAPILLARY
Glucose-Capillary: 121 mg/dL — ABNORMAL HIGH (ref 70–99)
Glucose-Capillary: 108 mg/dL — ABNORMAL HIGH (ref 70–99)
Glucose-Capillary: 115 mg/dL — ABNORMAL HIGH (ref 70–99)
Glucose-Capillary: 117 mg/dL — ABNORMAL HIGH (ref 70–99)
Glucose-Capillary: 120 mg/dL — ABNORMAL HIGH (ref 70–99)

## 2019-07-12 LAB — CBC WITH DIFFERENTIAL/PLATELET
Abs Immature Granulocytes: 0.01 10*3/uL (ref 0.00–0.07)
Basophils Absolute: 0 10*3/uL (ref 0.0–0.1)
Basophils Relative: 1 %
Eosinophils Absolute: 0.1 10*3/uL (ref 0.0–0.5)
Eosinophils Relative: 2 %
HCT: 34.3 % — ABNORMAL LOW (ref 39.0–52.0)
Hemoglobin: 11.9 g/dL — ABNORMAL LOW (ref 13.0–17.0)
Immature Granulocytes: 0 %
Lymphocytes Relative: 26 %
Lymphs Abs: 0.9 10*3/uL (ref 0.7–4.0)
MCH: 36.8 pg — ABNORMAL HIGH (ref 26.0–34.0)
MCHC: 34.7 g/dL (ref 30.0–36.0)
MCV: 106.2 fL — ABNORMAL HIGH (ref 80.0–100.0)
Monocytes Absolute: 0.2 10*3/uL (ref 0.1–1.0)
Monocytes Relative: 5 %
Neutro Abs: 2.2 10*3/uL (ref 1.7–7.7)
Neutrophils Relative %: 66 %
Platelets: 40 10*3/uL — ABNORMAL LOW (ref 150–400)
RBC: 3.23 MIL/uL — ABNORMAL LOW (ref 4.22–5.81)
RDW: 17.1 % — ABNORMAL HIGH (ref 11.5–15.5)
WBC: 3.4 10*3/uL — ABNORMAL LOW (ref 4.0–10.5)
nRBC: 0 % (ref 0.0–0.2)

## 2019-07-12 LAB — 10-HYDROXYCARBAZEPINE: Triliptal/MTB(Oxcarbazepin): 6 ug/mL — ABNORMAL LOW (ref 10–35)

## 2019-07-12 LAB — PROTIME-INR
INR: 1.2 (ref 0.8–1.2)
Prothrombin Time: 14.8 seconds (ref 11.4–15.2)

## 2019-07-12 MED ORDER — LEVETIRACETAM 500 MG PO TABS: 1000.0000 mg | ORAL_TABLET | Freq: Two times a day (BID) | ORAL | 3 refills | Status: AC

## 2019-07-12 MED ORDER — PANTOPRAZOLE SODIUM 40 MG PO PACK
40.0000 mg | PACK | Freq: Two times a day (BID) | ORAL | Status: DC
  Administered 2019-07-12: 40 mg via ORAL
  Filled 2019-07-12 (×2): qty 20

## 2019-07-12 MED ORDER — THIAMINE HCL 100 MG PO TABS
100.0000 mg | ORAL_TABLET | Freq: Every day | ORAL | Status: DC
  Administered 2019-07-12: 100 mg via ORAL
  Filled 2019-07-12: qty 1

## 2019-07-12 MED ORDER — POLYETHYLENE GLYCOL 3350 17 G PO PACK: 17.0000 g | PACK | Freq: Every day | ORAL | 0 refills | Status: AC | PRN

## 2019-07-12 NOTE — Discharge Summary (Signed)
Physician Discharge Summary  Hector [name] BJS:[id number] DOB: 1971/01/13 DOA: 07/09/2019  PCP: Macy Mis, MD  Admit date: 07/09/2019 Discharge date: 07/12/2019  Recommendations for Outpatient Follow-up:  1. Discharge to home with home health SLP 2. Follow up with PCP in 7-10 days 3. Follow up with neurology as outpatient as directed.  Discharge Diagnoses: Principal diagnosis is #1 1. 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 Transaminitis 3. Hepatic Cirrhosis 4. Tremors 5. AKI on CKD II 6. Thrombocytopenia  Discharge Condition: Fair  Disposition: Home with [facility] SLP  Diet recommendation: Heart healthy/DYS 3 with thin liquids  Filed Weights   07/09/19 1357  Weight: 63.5 kg    History of present illness:  Hector [name] is a 49 y.o. male with medical history significant of seizure, TBI, Cirrhosis from previous EtOH abuse, presented with tonic clonic seizure. Pt has been taken care of by his father who was with the patient at bedside. Pt post-ictal very sleepy and confused, so Hx was from father. Patient had a seizure with whole body shaking and eye rolling backwards on the floor. Woke up about few mins and then another one an hour and a half later. Father reported that patient has not had a seizure for at least 4-5 years. He had 2 of his seizure meds removed during the last visit a couple months ago at the neurologist office. Otherwise has been compliant with meds. Father also reported pt has been complains about epigastric pain after meal and diarrhea about one month. Pain last about 1 hour or 2, no night pain. Diarrhea was loose 2-3 episodes a day, no fever no chills, no weight loss. EMS reports a third seizure in route and they had to give 2.5 mg versed. CT head showing chronic changes, lab work unremarkable.  Hospital Course: The patient was admitted to a telemetry bed. Neurology was consulted.  CT head showing chronic changes, lab  work unremarkable. SLP has evaluated the patient and recommended DYS 3 with thin consistency liquids. The patient will be continued on IV  keppra and Vimpat. Levels of Oxcarzepime, vimpat, and primidone are being checked. MRI was motion degraded. EEG was cancelled by neurology as the patient had clinically improved with increased dose of keppra.  The patient has been evaluated by SLP, PT/OT. He has been cleared for discharge to home with [facility] SLP on increased dose of keppra. Today's assessment: S: The patient is resting comfortably. No new complaints. O: Vitals:  Vitals:   07/12/19 0900 07/12/19 1151  BP: 134/84 139/79  Pulse: 68 77  Resp: 20 20  Temp: 98.4 F (36.9 C) 98.4 F (36.9 C)  SpO2: 100% 100%   Constitutional:   The patient is awake, alert, and oriented x 3. No acute distress. Respiratory:   No increased work of breathing.  No wheezes, rales, or rhonchi  No tactile fremitus Cardiovascular:   Regular rate and rhythm  No murmurs, ectopy, or gallups.  No lateral PMI. No thrills. Abdomen:   Abdomen is soft, non-tender, non-distended  No hernias, masses, or organomegaly  Normoactive bowel sounds.  Musculoskeletal:   No cyanosis, clubbing, or edema Skin:   No rashes, lesions, ulcers  palpation of skin: no induration or nodules Neurologic:   CN 2-12 intact  Sensation all 4 extremities intact Psychiatric:   Mental status ? Mood, affect appropriate ? Orientation to person, place, time   judgment and insight appear intact   Discharge Instructions  Discharge Instructions  Activity as tolerated - No restrictions   Complete by: As directed    Call MD for:   Complete by: As directed    Neurological changes   Call MD for:  persistant nausea and vomiting   Complete by: As directed    Call MD for:  severe uncontrolled pain   Complete by: As directed    Diet - low sodium heart healthy   Complete by: As directed    Increase activity slowly    Complete by: As directed      Allergies as of 07/12/2019      Reactions   Ativan [lorazepam] Other (See Comments)   Pt has paradoxical effect with sedating medications   Lactulose Hives      Medication List    STOP taking these medications   Bufferin 325 MG Tabs tablet Generic drug: aspirin buffered     TAKE these medications   bromocriptine 2.5 MG tablet Commonly known as: PARLODEL Take 2.5 mg by mouth See admin instructions. Take 2.5 mg by mouth at 9 AM and 2.5 mg at 9 PM   fluocinonide cream 0.05 % Commonly known as: LIDEX Apply 1 application topically See admin instructions. Apply twice a day to hands and feet for 2 weeks, take 2 weeks off, then repeat   ketoconazole 2 % cream Commonly known as: NIZORAL Apply 1 application topically daily as needed for irritation (to affected areas, for dryness).   levETIRAcetam 500 MG tablet Commonly known as: KEPPRA Take 2 tablets (1,000 mg total) by mouth 2 (two) times daily. What changed: how much to take   ondansetron 8 MG disintegrating tablet Commonly known as: ZOFRAN-ODT Take 1 tablet (8 mg total) by mouth every 8 (eight) hours as needed for nausea or vomiting.   oxcarbazepine 600 MG tablet Commonly known as: TRILEPTAL Take 1 tablet (600 mg total) by mouth 2 (two) times daily. What changed:   when to take this  additional instructions   pantoprazole 40 MG tablet Commonly known as: PROTONIX Take 40 mg by mouth See admin instructions. Take 40 mg by mouth in the morning at 9 AM   polyethylene glycol 17 g packet Commonly known as: MIRALAX / GLYCOLAX Take 17 g by mouth daily as needed for moderate constipation.   primidone 50 MG tablet Commonly known as: MYSOLINE Take 2 tablets (100 mg total) by mouth at bedtime. What changed:   when to take this  additional instructions   QUEtiapine 100 MG tablet Commonly known as: SEROQUEL Take 100-200 mg by mouth See admin instructions. Take 100 mg by mouth at 10 AM, 100 mg  at 2 PM, and 200 mg at 9 PM   thiamine 100 MG tablet Commonly known as: VITAMIN B-1 TAKE 1 TABLET BY MOUTH EVERY DAY   Vimpat 150 MG Tabs Generic drug: Lacosamide Take 1 tablet (150 mg total) by mouth 2 (two) times daily. What changed:   when to take this  additional instructions      Allergies  Allergen Reactions   Ativan [Lorazepam] Other (See Comments)    Pt has paradoxical effect with sedating medications   Lactulose Hives    The results of significant diagnostics from this hospitalization (including imaging, microbiology, ancillary and laboratory) are listed below for reference.    Significant Diagnostic Studies: CT HEAD WO CONTRAST  Result Date: 07/09/2019 CLINICAL DATA:  Seizure EXAM: CT HEAD WITHOUT CONTRAST TECHNIQUE: Contiguous axial images were obtained from the base of the skull through the vertex without intravenous  contrast. COMPARISON:  July 13, 2017 FINDINGS: Brain: Postoperative changes noted on the left. There is extensive encephalomalacia throughout much of the left temporal lobe as well as involving portions of the left posterior frontal and left parietal lobes, stable. There is enlargement of the left lateral ventricle, particularly involving the atrium of the left lateral ventricle, an ex vacuo basis, stable. There is no demonstrable mass, hemorrhage, extra-axial fluid collection, or midline shift. Mild underlying atrophy is stable. No acute infarct is demonstrable. Vascular: No hyperdense vessel. There is slight vascular calcification in the carotid siphon regions. Skull: Postoperative changes are noted involving the left frontal and temporal bones, stable. No new bone lesions. Sinuses/Orbits: There is mucosal thickening in several ethmoid air cells. There is opacification of the visualized right maxillary antrum. There is rightward deviation of the nasal septum. Orbits appear symmetric bilaterally. Other: Mastoid air cells are clear. IMPRESSION: Extensive  encephalomalacia on the left with asymmetric enlargement of the left lateral ventricle on an ex vacuo basis, stable. Mild underlying generalized atrophy. No acute infarct. No mass or hemorrhage. Postoperative bony changes involving portions of the left temporal and frontal bones Areas of paranasal sinus disease, primarily involving the right maxillary antrum. Deviated nasal septum. Electronically Signed   By: Bretta Bang III M.D.   On: 07/09/2019 15:41   MR BRAIN WO CONTRAST  Result Date: 07/09/2019 CLINICAL DATA:  Seizure, abnormal neuro exam. EXAM: MRI HEAD WITHOUT CONTRAST TECHNIQUE: Multiplanar, multiecho pulse sequences of the brain and surrounding structures were obtained without intravenous contrast. COMPARISON:  Noncontrast head CT performed earlier the same day 07/09/2019, head CT 07/14/2017, brain MRI 04/20/2017 FINDINGS: Brain: The patient was unable to tolerate the full examination. As a result, only axial and coronal diffusion-weighted imaging, a sagittal T1 FLAIR sequence and and an axial T2 weighted sequence could be obtained. The sagittal T1 weighted sequence is mildly motion degraded. There is moderate to severe motion degradation of the axial T2 weighted sequence. There is apparent diffusion weighted hyperintensity within portions of the medial left temporal lobe without definite ADC correlate (series 2, image 18) (series 3, image 21). No diffusion-weighted signal abnormality is identified elsewhere within the brain. Redemonstrated extensive encephalomalacia within the left cerebral hemisphere involving the left frontal, temporal and parietal lobes. Associated chronic left hippocampal volume loss. Redemonstrated ex vacuo dilatation of the left lateral ventricle and atrophy of the left cerebral peduncle consistent with wallerian degeneration. Unchanged generalized cerebral atrophy Vascular: Flow voids maintained within the proximal large arterial vessels. Non dominant intracranial right  vertebral artery. Skull and upper cervical spine: No focal marrow lesion is identified. Left temporoparietal cranioplasty. Sinuses/Orbits: Visualized orbits demonstrate no acute abnormality. Mild ethmoid sinus mucosal thickening. Complete opacification of the right maxillary sinus. No significant mastoid effusion IMPRESSION: Motion degraded and prematurely terminated examination as described. There is apparent diffusion weighted hyperintensity without definite ADC correlate within portions of the medial left temporal lobe at the site of chronic left hippocampal volume loss. Findings may reflect artifact arising from the skull base, T2 shine through from gliosis or seizure related edema. Correlate clinically to exclude clinical features of HSV encephalitis, although this is considered less likely. Redemonstrated extensive encephalomalacia within additional portions of the left cerebral hemisphere as described. Associated ex vacuo dilatation of the left lateral ventricle and wallerian degeneration of the left cerebral peduncle. Persistent complete right maxillary sinus opacification. Electronically Signed   By: Jackey Loge DO   On: 07/09/2019 19:58   DG Chest Portable 1 View  Result Date: 07/09/2019 CLINICAL DATA:  Seizure. EXAM: PORTABLE CHEST 1 VIEW COMPARISON:  Chest radiograph 07/11/2017 FINDINGS: Heart size within normal limits. No evidence of airspace consolidation within the lungs. No evidence of pleural effusion or pneumothorax. Chronic elevation of the left hemidiaphragm. No acute bony abnormality. Overlying cardiac monitoring leads. IMPRESSION: No evidence of acute cardiopulmonary abnormality. Electronically Signed   By: Jackey LogeKyle  Golden DO   On: 07/09/2019 14:25   DG Swallowing Func-Speech Pathology  Result Date: 07/10/2019 Objective Swallowing Evaluation: Type of Study: MBS-Modified Barium Swallow Study  Patient Details Name: Hector [name] MRN: [id number] Date of Birth: 22-Aug-1970 Today's Date:  07/10/2019 Time: SLP Start Time (ACUTE ONLY): 1420 -SLP Stop Time (ACUTE ONLY): 1444 SLP Time Calculation (min) (ACUTE ONLY): 24 min Past Medical History: Past Medical History: Diagnosis Date  Cirrhosis (HCC)   Depression   DVT (deep venous thrombosis) (HCC)   UE, LE DVT during hospitalization  GERD (gastroesophageal reflux disease)   H/O ETOH abuse   H/O renal calculi   Seizure (HCC)   TBI (traumatic brain injury) (HCC)   07/2013, fall from standing post seizure  Tremor  Past Surgical History: Past Surgical History: Procedure Laterality Date  BRAIN SURGERY    GASTROSTOMY TUBE PLACEMENT    RADIOLOGY WITH ANESTHESIA N/A 04/20/2017  Procedure: MRI WITH ANESTHESIA OF THE BRAIN;  Surgeon: Radiologist, Medication, MD;  Location: [facility] OR;  Service: Radiology;  Laterality: N/A;  REMOVAL OF GASTROSTOMY TUBE    TRACHEOSTOMY    feinstein HPI: Hector [name] is a 49 y.o. male with history of tremor, TBI, seizures, alcohol abuse, DVT and cirrhosis.  Patient currently lives with his father. H/o dysphagia, last MBS (2019) regular/thin.  Subjective: cooperative Assessment / Plan / Recommendation CHL IP CLINICAL IMPRESSIONS 07/10/2019 Clinical Impression Patient presents with oropharyngeal dysphagia across all consistencies. Oral phase is remarkable for decreased bolus cohesion resulting in premature spillage to the vallecula and pyriform sinuses. Pharyngeal phase is remarkable for mild-moderate vallecular and pyriform sinus residue resulting in penetration/aspiration. Trace aspiration (PAS 8) occured with thin and nectar thick liquids during and after the swallow. As the pt took multiple swallows and cleared his throat in between bites/sips, pt was able to reduce the amount of residue and frequency of penetration/aspiration. A chin tuck was also utilized, helping to prevent aspiration, but pt was unable to maintain position throughout the study. Given the amount of residue, recommend Dys 3 diet and thin liquids. Ensure  the pt takes multiple swallows and clears his throat in between bites/sips.  SLP Visit Diagnosis Dysphagia, oropharyngeal phase (R13.12) Attention and concentration deficit following -- Frontal lobe and executive function deficit following -- Impact on safety and function Mild aspiration risk   CHL IP TREATMENT RECOMMENDATION 07/10/2019 Treatment Recommendations Therapy as outlined in treatment plan below   Prognosis 07/10/2019 Prognosis for Safe Diet Advancement Good Barriers to Reach Goals -- Barriers/Prognosis Comment -- CHL IP DIET RECOMMENDATION 07/10/2019 SLP Diet Recommendations Thin liquid;Dysphagia 3 (Mech soft) solids Liquid Administration via Cup;Straw Medication Administration Whole meds with puree Compensations Minimize environmental distractions;Slow rate;Small sips/bites;Multiple dry swallows after each bite/sip;Clear throat after each swallow Postural Changes Remain semi-upright after after feeds/meals (Comment);Seated upright at 90 degrees   CHL IP OTHER RECOMMENDATIONS 07/10/2019 Recommended Consults -- Oral Care Recommendations Oral care BID Other Recommendations --   CHL IP FOLLOW UP RECOMMENDATIONS 07/10/2019 Follow up Recommendations Other (comment)   CHL IP FREQUENCY AND DURATION 07/10/2019 Speech Therapy Frequency (ACUTE ONLY) min 2x/week Treatment Duration 2 weeks  CHL IP ORAL PHASE 07/10/2019 Oral Phase Impaired Oral - Pudding Teaspoon -- Oral - Pudding Cup -- Oral - Honey Teaspoon -- Oral - Honey Cup -- Oral - Nectar Teaspoon -- Oral - Nectar Cup Decreased bolus cohesion;Premature spillage Oral - Nectar Straw -- Oral - Thin Teaspoon -- Oral - Thin Cup Premature spillage;Decreased bolus cohesion Oral - Thin Straw Lingual/palatal residue;Premature spillage;Decreased bolus cohesion Oral - Puree Decreased bolus cohesion;Premature spillage Oral - Mech Soft -- Oral - Regular Decreased bolus cohesion;Premature spillage Oral - Multi-Consistency -- Oral - Pill -- Oral Phase - Comment --  CHL IP  PHARYNGEAL PHASE 07/10/2019 Pharyngeal Phase Impaired Pharyngeal- Pudding Teaspoon -- Pharyngeal -- Pharyngeal- Pudding Cup -- Pharyngeal -- Pharyngeal- Honey Teaspoon -- Pharyngeal -- Pharyngeal- Honey Cup -- Pharyngeal -- Pharyngeal- Nectar Teaspoon -- Pharyngeal -- Pharyngeal- Nectar Cup Penetration/Aspiration during swallow;Pharyngeal residue - valleculae;Pharyngeal residue - pyriform;Trace aspiration;Reduced airway/laryngeal closure Pharyngeal Material enters airway, passes BELOW cords without attempt by patient to eject out (silent aspiration) Pharyngeal- Nectar Straw -- Pharyngeal -- Pharyngeal- Thin Teaspoon -- Pharyngeal -- Pharyngeal- Thin Cup Pharyngeal residue - valleculae;Pharyngeal residue - pyriform;Penetration/Aspiration during swallow;Trace aspiration;Penetration/Apiration after swallow;Reduced airway/laryngeal closure Pharyngeal Material enters airway, passes BELOW cords without attempt by patient to eject out (silent aspiration) Pharyngeal- Thin Straw Pharyngeal residue - valleculae;Pharyngeal residue - pyriform Pharyngeal -- Pharyngeal- Puree Pharyngeal residue - pyriform;Pharyngeal residue - valleculae Pharyngeal -- Pharyngeal- Mechanical Soft -- Pharyngeal -- Pharyngeal- Regular Pharyngeal residue - pyriform;Pharyngeal residue - valleculae Pharyngeal -- Pharyngeal- Multi-consistency -- Pharyngeal -- Pharyngeal- Pill -- Pharyngeal -- Pharyngeal Comment --  CHL IP CERVICAL ESOPHAGEAL PHASE 07/10/2019 Cervical Esophageal Phase WFL Pudding Teaspoon -- Pudding Cup -- Honey Teaspoon -- Honey Cup -- Nectar Teaspoon -- Nectar Cup -- Nectar Straw -- Thin Teaspoon -- Thin Cup -- Thin Straw -- Puree -- Mechanical Soft -- Regular -- Multi-consistency -- Pill -- Cervical Esophageal Comment -- Mahala Menghini., M.A. CCC-SLP Acute Rehabilitation Services Pager [phone] Office [phone] 07/10/2019, 5:03 PM              US Abdomen Limited RUQ  Result Date: 07/09/2019 CLINICAL DATA:  LFT elevation. Additional  history provided by technologist: Prior cholecystectomy EXAM: ULTRASOUND ABDOMEN LIMITED RIGHT UPPER QUADRANT COMPARISON:  CT abdomen/pelvis 07/01/2017 FINDINGS: Gallbladder: Prior cholecystectomy. Common bile duct: Diameter: 4-5 mm, within normal limits. Liver: No focal lesion identified. Heterogeneously increased hepatic parenchymal echogenicity. The interrogated portal vein is patent on color Doppler imaging with normal direction of blood flow towards the liver. IMPRESSION: Prior cholecystectomy. The visualized common duct is normal in caliber. Heterogeneously increased hepatic parenchymal echogenicity. This is a nonspecific finding which may be seen in the setting of hepatic steatosis or other chronic hepatic parenchymal disease. Electronically Signed   By: Jackey Loge DO   On: 07/09/2019 18:00    Microbiology: Recent Results (from the past 240 hour(s))  SARS CORONAVIRUS 2 (TAT 6-24 HRS) Nasopharyngeal Nasopharyngeal Swab     Status: None   Collection Time: 07/09/19  4:18 PM   Specimen: Nasopharyngeal Swab  Result Value Ref Range Status   SARS Coronavirus 2 NEGATIVE NEGATIVE Final    Comment: (NOTE) SARS-CoV-2 target nucleic acids are NOT DETECTED. The SARS-CoV-2 RNA is generally detectable in upper and lower respiratory specimens during the acute phase of infection. Negative results do not preclude SARS-CoV-2 infection, do not rule out co-infections with other pathogens, and should not be used as the sole basis for treatment or other patient management decisions. Negative results must be combined with clinical  observations, patient history, and epidemiological information. The expected result is Negative. Fact Sheet for Patients: HairSlick.no Fact Sheet for Healthcare Providers: quierodirigir.com This test is not yet approved or cleared by the [place] FDA and  has been authorized for detection and/or diagnosis of SARS-CoV-2  by FDA under an Emergency Use Authorization (EUA). This EUA will remain  in effect (meaning this test can be used) for the duration of the COVID-19 declaration under Section 56 4(b)(1) of the Act, 21 U.S.C. section 360bbb-3(b)(1), unless the authorization is terminated or revoked sooner. Performed at [facility], [address] [place], [place] [address]      Labs: Basic Metabolic Panel: Recent Labs  Lab 07/09/19 1400 07/09/19 1748 07/09/19 1752 07/10/19 0341 07/12/19 0416  NA 141  --  139 143 138  K 4.1  --  4.0 3.9 3.6  CL 99  --   --  104 102  CO2 8*  --   --  23 29  GLUCOSE 164*  --   --  56* 93  BUN 9  --   --  6 12  CREATININE 1.58*  --   --  1.31* 0.98  CALCIUM 9.3  --   --  9.0 8.2*  MG 2.4  --   --   --   --   PHOS  --  2.8  --   --   --    Liver Function Tests: Recent Labs  Lab 07/09/19 1400 07/10/19 0341 07/12/19 0416  AST 162* 629* 159*  ALT 180* 118* 67*  ALKPHOS 174* 175* 125  BILITOT 1.5* 3.5* 0.9  PROT 7.9 6.4* 5.0*  ALBUMIN 4.2 3.3* 2.4*   No results for input(s): LIPASE, AMYLASE in the last 168 hours. Recent Labs  Lab 07/09/19 1748  AMMONIA 72*   CBC: Recent Labs  Lab 07/09/19 1400 07/09/19 1752 07/10/19 0341 07/12/19 0416  WBC 7.4  --  6.8 3.4*  NEUTROABS 5.3  --   --  2.2  HGB 15.2 13.3 13.4 11.9*  HCT 47.8 39.0 39.0 34.3*  MCV 115.2*  --  105.1* 106.2*  PLT 70*  --  35* 40*   Cardiac Enzymes: No results for input(s): CKTOTAL, CKMB, CKMBINDEX, TROPONINI in the last 168 hours. BNP: BNP (last 3 results) No results for input(s): BNP in the last 8760 hours.  ProBNP (last 3 results) No results for input(s): PROBNP in the last 8760 hours.  CBG: Recent Labs  Lab 07/09/19 1409 07/12/19 0041 07/12/19 0500 07/12/19 0854 07/12/19 1151  GLUCAP 151* 121* 108* 115* 117*    Active Problems:   Seizure (HCC)   Time coordinating discharge: 38 minutes  Signed:        Trejuan Matherne, DO [facility]  07/12/2019,  3:10 PM

## 2019-07-12 NOTE — Evaluation (Signed)
Physical Therapy Evaluation Patient Details Name: Hector [name] MRN: [id number] DOB: 1970/05/01 Today's Date: 07/12/2019   History of Present Illness  Hector [name] is a 49 y.o. male with history of tremor, TBI, seizures, alcohol abuse, DVT and cirrhosis.  Admitted with with tonic clonic seizure.  Clinical Impression  Patient presents with mobility limited due to decreased balance, decreased activity tolerance and decreased strength.  Unknown baseline as pt aphasic and unable to reach father by phone with no voicemail set up.  Feel he may be close to baseline, so likely can d/c home with assist from father.  Would not qualify for HHPT.  Will follow acutely.     Follow Up Recommendations Supervision/Assistance - 24 hour;No PT follow up    Equipment Recommendations  Other (comment)(TBA)    Recommendations for Other Services       Precautions / Restrictions Precautions Precautions: Fall Precaution Comments: seizure      Mobility  Bed Mobility Overal bed mobility: Needs Assistance Bed Mobility: Supine to Sit;Sit to Supine     Supine to sit: Min assist Sit to supine: Supervision   General bed mobility comments: assist for balance initially upon sitting  Transfers Overall transfer level: Needs assistance Equipment used: None Transfers: Sit to/from [employer] Transfers Sit to Stand: Min assist Stand pivot transfers: Min assist       General transfer comment: pivoted to armchair at bedside; back to bed lunging with hands first to bed  Ambulation/Gait             General Gait Details: NT  Stairs            Wheelchair Mobility    Modified Rankin (Stroke Patients Only)       Balance Overall balance assessment: Needs assistance Sitting-balance support: Feet supported Sitting balance-Leahy Scale: Poor Sitting balance - Comments: initially falling back on bed min A for balance, then improved and sat EOB to don pants and socks with close S      Standing balance-Leahy Scale: Poor Standing balance comment: pivot to chair and to bed very unsteady with min A even with pt reaching for UE support on chair or bed                             Pertinent Vitals/Pain Faces Pain Scale: No hurt    Home Living Family/patient expects to be discharged to:: Private residence Living Arrangements: Parent Available Help at Discharge: Family             Additional Comments: patient with aphasia and no family present for info    Prior Function Level of Independence: Needs assistance   Gait / Transfers Assistance Needed: unsure of how much assist needed, lives with his father           Hand Dominance        Extremity/Trunk Assessment   Upper Extremity Assessment Upper Extremity Assessment: Overall WFL for tasks assessed    Lower Extremity Assessment Lower Extremity Assessment: RLE deficits/detail;LLE deficits/detail RLE Deficits / Details: AAROM WFL, in bed stiffness in knees and difficulty flexing knees RLE Coordination: decreased gross motor LLE Deficits / Details: AAROM WFL, in bed stiffness in knees and difficulty flexing knees LLE Coordination: decreased gross motor       Communication   Communication: Expressive difficulties  Cognition Arousal/Alertness: Awake/alert Behavior During Therapy: WFL for tasks assessed/performed Overall Cognitive Status: No family/caregiver present to determine baseline cognitive functioning  General Comments: initially not cooperative, but engaged in donning his clothes at the bedside      General Comments General comments (skin integrity, edema, etc.): in bed soiled with urine and initially not willing to move, then participated when allowed to get his pants on    Exercises     Assessment/Plan    PT Assessment Patient needs continued PT services  PT Problem List Decreased balance;Decreased coordination;Decreased  mobility;Decreased safety awareness;Decreased cognition       PT Treatment Interventions DME instruction;Gait training;Functional mobility training;Neuromuscular re-education;Patient/family education;Balance training;Therapeutic activities;Therapeutic exercise    PT Goals (Current goals can be found in the Care Plan section)  Acute Rehab PT Goals Patient Stated Goal: none stated PT Goal Formulation: Patient unable to participate in goal setting Time For Goal Achievement: 07/26/19 Potential to Achieve Goals: Fair    Frequency Min 3X/week   Barriers to discharge        Co-evaluation               AM-PAC PT "6 Clicks" Mobility  Outcome Measure Help needed turning from your back to your side while in a flat bed without using bedrails?: None Help needed moving from lying on your back to sitting on the side of a flat bed without using bedrails?: A Little Help needed moving to and from a bed to a chair (including a wheelchair)?: A Little Help needed standing up from a chair using your arms (e.g., wheelchair or bedside chair)?: A Little Help needed to walk in hospital room?: A Lot Help needed climbing 3-5 steps with a railing? : Total 6 Click Score: 16    End of Session   Activity Tolerance: Patient tolerated treatment well Patient left: in bed;with call bell/phone within reach;with bed alarm set Nurse Communication: Mobility status PT Visit Diagnosis: Other abnormalities of gait and mobility (R26.89);Other symptoms and signs involving the nervous system (R29.898)    Time: 5056-9794 PT Time Calculation (min) (ACUTE ONLY): 24 min   Charges:   PT Evaluation $PT Eval Moderate Complexity: 1 Mod PT Treatments $Therapeutic Activity: 8-22 mins        Magda Kiel, PT Acute Rehabilitation Services [phone] 07/12/2019   Reginia Naas 07/12/2019, 2:14 PM

## 2019-07-12 NOTE — Plan of Care (Signed)
  Problem: Clinical Measurements: Goal: Ability to maintain clinical measurements within normal limits will improve Outcome: Progressing   Problem: Nutrition: Goal: Adequate nutrition will be maintained Outcome: Progressing   

## 2019-07-12 NOTE — TOC Transition Note (Signed)
Transition of Care [facility]) - CM/SW Discharge Note   Patient Details  Name: Hector [name] MRN: [id number] Date of Birth: 09-18-1970  [facility] CM/SW Contact:  Baldemar Lenis, LCSW Phone Number: 07/12/2019, 4:15 PM   Clinical Narrative:   CSW spoke with patient's father about patient discharging home. Patient will need PTAR home. CSW discussed with patient's father about recommendation for speech follow up, and father discussed how the patient has been in speech multiple times and it never does anything for him; father not interested in trying speech therapy again. CSW to set up PTAR for patient home.    Final next level of care: Home/Self Care Barriers to Discharge: Barriers Resolved   Patient Goals and CMS Choice Patient states their goals for this hospitalization and ongoing recovery are:: patient unable to participate in goal setting as he does not verbally communicate CMS Medicare.gov Compare Post Acute Care list provided to:: Patient Represenative (must comment) Choice offered to / list presented to : Parent  Discharge Placement                Patient to be transferred to facility by: PTAR Name of family member notified: [name] Patient and family notified of of transfer: 07/12/19  Discharge Plan and Services                                     Social Determinants of Health (SDOH) Interventions     Readmission Risk Interventions No flowsheet data found.

## 2019-08-09 DIAGNOSIS — R7989 Other specified abnormal findings of blood chemistry: Secondary | ICD-10-CM

## 2019-09-17 ENCOUNTER — Telehealth: Payer: Self-pay

## 2019-09-17 ENCOUNTER — Other Ambulatory Visit: Payer: Self-pay | Admitting: Neurology

## 2019-09-17 MED ORDER — VIMPAT 150 MG PO TABS: 1.0000 | ORAL_TABLET | Freq: Two times a day (BID) | ORAL | 3 refills | Status: AC

## 2019-09-17 NOTE — Telephone Encounter (Signed)
Done

## 2019-09-17 NOTE — Telephone Encounter (Signed)
Refill request rececived from [facility] pharmacy:   Pt needs a new script for Vimpat.

## 2020-04-10 NOTE — Progress Notes (Deleted)
NEUROLOGY FOLLOW UP OFFICE NOTE  Hector [name] [id number]   Subjective:  Hector [name] is a 49 year old right-handed man with seizure disorder, alcoholism and cirrhosis who follows up for traumatic brain injury and subdural hematoma with subsequent symptomatic epilepsy.He is accompanied by his father who provides some history.Hospital records reviewed.  UPDATE: History obtained by his father as patient is aphasic. Current medication: Trileptal 600mg  twice daily. Keppra 1000mg  twice daily Vimpat 150mg  twice daily Primidone 100mg  at bedtime Bromocriptine 5mg  twice daily (not prescribed by me)  He was hospitalized in March 2021 for breakthrough seizures- 2 generalized tonic-clonic seizures lasting a few minutes about 90 minutes apart.  He reportedly had not missed any doses of AEDs.  CT head showed no acute abnormalities and MRI was motion-degraded.  Oxcarbazepine level was mildly low at 6.  Lactic acid was elevated at 3.9.  No infection.  Keppra was increased and he clinically improved.    HISTORY: On 08/12/13, he had a seizure while in a convenience store.He fell, hitting his head on the pavement.He was brought to the ED at [facility].CT of the head showed a large left subdural hematoma with subarachnoid blood over the left convexity with a 12 mm left to right shift.He underwent emergent craniotomy.He was in an induced coma for 4-5 weeks.His hospital course was complicated by development of a DVT secondary to PICC, requiring heparin drip, VDRF status post extubation, and pneumonia.He is still taking Xarelto.He had a G-tube placed for nutrition.He followed up with an neurologist, Dr. .He was placed on Zoloft for agitation and outbursts.He presented to the ED at [facility] on 12/06/13 for new shaking episodes with extreme agitation and combativeness.It seemed to have occurred after taking a dose of risperidone.CT of the head  from 12/06/13, showed post-surgical changes, with interval clearing of left extra-axial fluid collection, mild increase in ventricular size (left greater than right), hypodense areas involving the left cerebrum and no midline shift or hemorrhage.He was found to have HCAP and sepsis and was treated with antibiotics.  [facility] resides at [facility].He has regained much more strength in the right upper extremity.His mood is stable.Recently, his PEG and trach was removed.He has an unsteady gait but ambulates with other people around.He has global aphasia.He finished PT/OT and is currently still undergoing speech therapy.He has had no further seizures.He denies headache  He has a history of alcoholism.Prior to the accident, he had two other seizures over a 5-6 year period.It was attributed to his drinking.He was never taking an antiepileptic medication.He has a tenth grade education.Prior to the accident, he had his own apartment.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  He has had several hospitalizations often due to worsening tremor or increased agitation and confusion. He was admitted to [facility] in September 2018 for severe protein calorie malnutrition with failure to thrive, acute on chronic kidney disease and orthostatic hypotension. He was readmitted in November for acute gastroenteritis. Tremors have gotten worse. They did not respond to increased bromocriptine. In December 2018, he was again admitted to the hospital for worsening weakness, as well as worsening tremor and jerking movement. Brain MRI revealed left hemispheric encephalomalacia but no acute findings. EEG did not reveal any epileptiform discharges or seizures. Suspecting polypharmacy, Linzess and Seroquel were discontinued. Lexapro was reduced to 5mg  daily. He still has tremors. His appetite continues to be poor and he has lost weight. He is weak.  Prior  antiepileptic medications:Depakote (  tremor), Keppra (irritability), Lamictal (rash)  PAST MEDICAL HISTORY: Past Medical History:  Diagnosis Date  . Cirrhosis (HCC)   . Depression   . DVT (deep venous thrombosis) (HCC)    UE, LE DVT during hospitalization  . GERD (gastroesophageal reflux disease)   . H/O ETOH abuse   . H/O renal calculi   . Seizure (HCC)   . TBI (traumatic brain injury) (HCC)    07/2013, fall from standing post seizure  . Tremor     MEDICATIONS: Current Outpatient Medications on File Prior to Visit  Medication Sig Dispense Refill  . bromocriptine (PARLODEL) 2.5 MG tablet Take 2.5 mg by mouth See admin instructions. Take 2.5 mg by mouth at 9 AM and 2.5 mg at 9 PM    . fluocinonide cream (LIDEX) 0.05 % Apply 1 application topically See admin instructions. Apply twice a day to hands and feet for 2 weeks, take 2 weeks off, then repeat    . ketoconazole (NIZORAL) 2 % cream Apply 1 application topically daily as needed for irritation (to affected areas, for dryness).     . Lacosamide (VIMPAT) 150 MG TABS Take 1 tablet (150 mg total) by mouth 2 (two) times daily. 180 tablet 3  . levETIRAcetam (KEPPRA) 500 MG tablet Take 2 tablets (1,000 mg total) by mouth 2 (two) times daily. 180 tablet 3  . ondansetron (ZOFRAN-ODT) 8 MG disintegrating tablet Take 1 tablet (8 mg total) by mouth every 8 (eight) hours as needed for nausea or vomiting. (Patient not taking: Reported on 07/10/2019) 90 tablet 0  . oxcarbazepine (TRILEPTAL) 600 MG tablet Take 1 tablet (600 mg total) by mouth 2 (two) times daily. (Patient taking differently: Take 600 mg by mouth See admin instructions. Take 600 mg by mouth at 9 AM and 600 mg at 9 PM) 180 tablet 3  . pantoprazole (PROTONIX) 40 MG tablet Take 40 mg by mouth See admin instructions. Take 40 mg by mouth in the morning at 9 AM    . polyethylene glycol (MIRALAX / GLYCOLAX) 17 g packet Take 17 g by mouth daily as needed for moderate constipation. 14 each 0  .  primidone (MYSOLINE) 50 MG tablet Take 2 tablets (100 mg total) by mouth at bedtime. (Patient taking differently: Take 100 mg by mouth See admin instructions. Take 100 mg by mouth at 9 PM) 180 tablet 3  . QUEtiapine (SEROQUEL) 100 MG tablet Take 100-200 mg by mouth See admin instructions. Take 100 mg by mouth at 10 AM, 100 mg at 2 PM, and 200 mg at 9 PM  3  . thiamine (VITAMIN B-1) 100 MG tablet TAKE 1 TABLET BY MOUTH EVERY DAY (Patient not taking: No sig reported) 30 tablet 5   No current facility-administered medications on file prior to visit.    ALLERGIES: Allergies  Allergen Reactions  . Ativan [Lorazepam] Other (See Comments)    Pt has paradoxical effect with sedating medications  . Lactulose Hives    FAMILY HISTORY: Family History  Problem Relation Age of Onset  . Hypercholesterolemia Mother   . Hypertension Mother   . Congestive Heart Failure Brother        Deceased age 17    SOCIAL HISTORY: Social History   Socioeconomic History  . Marital status: Single    Spouse name: Not on file  . Number of children: Not on file  . Years of education: Not on file  . Highest education level: Not on file  Occupational History  . Not on  file  Tobacco Use  . Smoking status: Former Smoker    Packs/day: 0.25    Years: 20.00    Pack years: 5.00    Types: Cigarettes    Quit date: 04/17/2017    Years since quitting: 2.9  . Smokeless tobacco: Never Used  Vaping Use  . Vaping Use: Never used  Substance and Sexual Activity  . Alcohol use: Yes    Alcohol/week: 0.0 standard drinks  . Drug use: No  . Sexual activity: Not on file  Other Topics Concern  . Not on file  Social History Narrative   Right handed, uses left hand due to seizure   One story home   Social Determinants of Health   Financial Resource Strain: Not on file  Food Insecurity: Not on file  Transportation Needs: Not on file  Physical Activity: Not on file  Stress: Not on file  Social Connections: Not on file   Intimate Partner Violence: Not on file     Objective:  *** General: No acute distress.  Patient appears ***-groomed.   Head:  Normocephalic/atraumatic Eyes:  Fundi examined but not visualized Neck: supple, no paraspinal tenderness, full range of motion Heart:  Regular rate and rhythm Lungs:  Clear to auscultation bilaterally Back: No paraspinal tenderness Neurological Exam: alert and oriented to person, place, and time. Attention span and concentration intact, recent and remote memory intact, fund of knowledge intact.  Speech fluent and not dysarthric, language intact.  CN II-XII intact. Bulk and tone normal, muscle strength 5/5 throughout.  Sensation to light touch, temperature and vibration intact.  Deep tendon reflexes 2+ throughout, toes downgoing.  Finger to nose and heel to shin testing intact.  Gait normal, Romberg negative.   Assessment/Plan:   1.  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  ***  Shon Millet, DO  CC: Delbert Harness, MD

## 2020-04-11 ENCOUNTER — Ambulatory Visit: Payer: Medicaid Other | Admitting: Neurology
